# Patient Record
Sex: Female | Born: 1935
Health system: Southern US, Community
[De-identification: ages and names within clinical notes are randomized; demographics above are authoritative.]

## PROBLEM LIST (undated history)

## (undated) DIAGNOSIS — Z860100 Personal history of colon polyps, unspecified: Secondary | ICD-10-CM

## (undated) DIAGNOSIS — I4892 Unspecified atrial flutter: Secondary | ICD-10-CM

## (undated) DIAGNOSIS — R600 Localized edema: Secondary | ICD-10-CM

## (undated) DIAGNOSIS — M1990 Unspecified osteoarthritis, unspecified site: Secondary | ICD-10-CM

## (undated) DIAGNOSIS — I340 Nonrheumatic mitral (valve) insufficiency: Secondary | ICD-10-CM

## (undated) DIAGNOSIS — E538 Deficiency of other specified B group vitamins: Secondary | ICD-10-CM

## (undated) DIAGNOSIS — R51 Headache: Secondary | ICD-10-CM

## (undated) DIAGNOSIS — Z0000 Encounter for general adult medical examination without abnormal findings: Secondary | ICD-10-CM

## (undated) DIAGNOSIS — I10 Essential (primary) hypertension: Secondary | ICD-10-CM

## (undated) DIAGNOSIS — R634 Abnormal weight loss: Secondary | ICD-10-CM

## (undated) DIAGNOSIS — R001 Bradycardia, unspecified: Secondary | ICD-10-CM

## (undated) DIAGNOSIS — I341 Nonrheumatic mitral (valve) prolapse: Secondary | ICD-10-CM

## (undated) DIAGNOSIS — R32 Unspecified urinary incontinence: Secondary | ICD-10-CM

## (undated) DIAGNOSIS — K5790 Diverticulosis of intestine, part unspecified, without perforation or abscess without bleeding: Secondary | ICD-10-CM

## (undated) DIAGNOSIS — K5732 Diverticulitis of large intestine without perforation or abscess without bleeding: Secondary | ICD-10-CM

## (undated) DIAGNOSIS — K219 Gastro-esophageal reflux disease without esophagitis: Secondary | ICD-10-CM

## (undated) DIAGNOSIS — I4819 Other persistent atrial fibrillation: Secondary | ICD-10-CM

## (undated) DIAGNOSIS — T50995A Adverse effect of other drugs, medicaments and biological substances, initial encounter: Secondary | ICD-10-CM

## (undated) DIAGNOSIS — K921 Melena: Secondary | ICD-10-CM

## (undated) DIAGNOSIS — R269 Unspecified abnormalities of gait and mobility: Secondary | ICD-10-CM

## (undated) DIAGNOSIS — E079 Disorder of thyroid, unspecified: Secondary | ICD-10-CM

## (undated) DIAGNOSIS — F419 Anxiety disorder, unspecified: Secondary | ICD-10-CM

## (undated) DIAGNOSIS — Z7901 Long term (current) use of anticoagulants: Secondary | ICD-10-CM

## (undated) DIAGNOSIS — D649 Anemia, unspecified: Secondary | ICD-10-CM

## (undated) DIAGNOSIS — I080 Rheumatic disorders of both mitral and aortic valves: Secondary | ICD-10-CM

## (undated) DIAGNOSIS — K589 Irritable bowel syndrome without diarrhea: Secondary | ICD-10-CM

## (undated) DIAGNOSIS — M8580 Other specified disorders of bone density and structure, unspecified site: Secondary | ICD-10-CM

## (undated) DIAGNOSIS — I4891 Unspecified atrial fibrillation: Secondary | ICD-10-CM

## (undated) DIAGNOSIS — E785 Hyperlipidemia, unspecified: Secondary | ICD-10-CM

## (undated) DIAGNOSIS — I071 Rheumatic tricuspid insufficiency: Secondary | ICD-10-CM

## (undated) DIAGNOSIS — N200 Calculus of kidney: Secondary | ICD-10-CM

## (undated) HISTORY — DX: Adverse effect of other drugs, medicaments and biological substances, initial encounter: T50.995A

## (undated) HISTORY — DX: Personal history of colon polyps, unspecified: Z86.0100

## (undated) HISTORY — DX: Encounter for general adult medical examination without abnormal findings: Z00.00

## (undated) HISTORY — DX: Nonrheumatic mitral (valve) prolapse: I34.1

## (undated) HISTORY — DX: Unspecified atrial flutter: I48.92

## (undated) HISTORY — DX: Long term (current) use of anticoagulants: Z79.01

## (undated) HISTORY — DX: Essential (primary) hypertension: I10

## (undated) HISTORY — DX: Anemia, unspecified: D64.9

## (undated) HISTORY — DX: Deficiency of other specified B group vitamins: E53.8

## (undated) HISTORY — PX: CARDIAC ELECTROPHYSIOLOGY MAPPING AND ABLATION: SHX1292

## (undated) HISTORY — DX: Personal history of colonic polyps: Z86.010

## (undated) HISTORY — PX: ESOPHAGOSCOPY W/ BOTOX INJECTION: SHX1533

## (undated) HISTORY — DX: Unspecified osteoarthritis, unspecified site: M19.90

## (undated) HISTORY — DX: Abnormal weight loss: R63.4

## (undated) HISTORY — DX: Headache: R51

## (undated) HISTORY — DX: Gastro-esophageal reflux disease without esophagitis: K21.9

## (undated) HISTORY — DX: Disorder of thyroid, unspecified: E07.9

## (undated) HISTORY — DX: Calculus of kidney: N20.0

## (undated) HISTORY — DX: Unspecified urinary incontinence: R32

## (undated) HISTORY — DX: Anxiety disorder, unspecified: F41.9

## (undated) HISTORY — DX: Hyperlipidemia, unspecified: E78.5

## (undated) HISTORY — DX: Diverticulosis of intestine, part unspecified, without perforation or abscess without bleeding: K57.90

## (undated) HISTORY — DX: Diverticulitis of large intestine without perforation or abscess without bleeding: K57.32

## (undated) HISTORY — DX: Melena: K92.1

## (undated) HISTORY — DX: Irritable bowel syndrome, unspecified: K58.9

## (undated) HISTORY — DX: Unspecified abnormalities of gait and mobility: R26.9

## (undated) HISTORY — DX: Rheumatic disorders of both mitral and aortic valves: I08.0

## (undated) HISTORY — DX: Other specified disorders of bone density and structure, unspecified site: M85.80

## (undated) HISTORY — DX: Rheumatic tricuspid insufficiency: I07.1

## (undated) HISTORY — DX: Nonrheumatic mitral (valve) insufficiency: I34.0

## (undated) HISTORY — DX: Other persistent atrial fibrillation: I48.19

## (undated) HISTORY — DX: Bradycardia, unspecified: R00.1

## (undated) HISTORY — DX: Localized edema: R60.0

## (undated) HISTORY — PX: BREAST BIOPSY: SHX20

## (undated) HISTORY — PX: PARTIAL HYSTERECTOMY: SHX80

## (undated) HISTORY — PX: CATARACT EXTRACTION, BILATERAL: SHX1313

## (undated) HISTORY — PX: OTHER SURGICAL HISTORY: SHX169

---

## 1997-10-29 ENCOUNTER — Encounter: Payer: Self-pay | Admitting: Emergency Medicine

## 1997-10-29 ENCOUNTER — Emergency Department (HOSPITAL_COMMUNITY): Admission: EM | Admit: 1997-10-29 | Discharge: 1997-10-29 | Payer: Self-pay | Admitting: Emergency Medicine

## 1998-01-21 ENCOUNTER — Other Ambulatory Visit: Admission: RE | Admit: 1998-01-21 | Discharge: 1998-01-21 | Payer: Self-pay | Admitting: Family Medicine

## 1999-01-03 ENCOUNTER — Encounter: Payer: Self-pay | Admitting: Family Medicine

## 1999-01-03 ENCOUNTER — Encounter: Admission: RE | Admit: 1999-01-03 | Discharge: 1999-01-03 | Payer: Self-pay | Admitting: Family Medicine

## 1999-02-03 ENCOUNTER — Other Ambulatory Visit: Admission: RE | Admit: 1999-02-03 | Discharge: 1999-02-03 | Payer: Self-pay | Admitting: Family Medicine

## 1999-02-08 ENCOUNTER — Encounter: Admission: RE | Admit: 1999-02-08 | Discharge: 1999-02-08 | Payer: Self-pay | Admitting: Family Medicine

## 1999-02-08 ENCOUNTER — Encounter: Payer: Self-pay | Admitting: Family Medicine

## 2000-01-09 ENCOUNTER — Encounter: Admission: RE | Admit: 2000-01-09 | Discharge: 2000-01-09 | Payer: Self-pay | Admitting: Family Medicine

## 2000-01-09 ENCOUNTER — Encounter: Payer: Self-pay | Admitting: Family Medicine

## 2000-02-22 ENCOUNTER — Other Ambulatory Visit: Admission: RE | Admit: 2000-02-22 | Discharge: 2000-02-22 | Payer: Self-pay | Admitting: Family Medicine

## 2000-11-14 ENCOUNTER — Ambulatory Visit (HOSPITAL_COMMUNITY): Admission: RE | Admit: 2000-11-14 | Discharge: 2000-11-15 | Payer: Self-pay | Admitting: Internal Medicine

## 2001-02-25 ENCOUNTER — Encounter: Admission: RE | Admit: 2001-02-25 | Discharge: 2001-02-25 | Payer: Self-pay | Admitting: Family Medicine

## 2001-02-25 ENCOUNTER — Encounter: Payer: Self-pay | Admitting: Family Medicine

## 2001-03-03 ENCOUNTER — Encounter: Admission: RE | Admit: 2001-03-03 | Discharge: 2001-03-03 | Payer: Self-pay | Admitting: Family Medicine

## 2001-03-03 ENCOUNTER — Encounter: Payer: Self-pay | Admitting: Family Medicine

## 2002-03-05 ENCOUNTER — Encounter: Admission: RE | Admit: 2002-03-05 | Discharge: 2002-03-05 | Payer: Self-pay | Admitting: Family Medicine

## 2002-03-05 ENCOUNTER — Encounter: Payer: Self-pay | Admitting: Family Medicine

## 2003-03-22 ENCOUNTER — Encounter: Admission: RE | Admit: 2003-03-22 | Discharge: 2003-03-22 | Payer: Self-pay | Admitting: Family Medicine

## 2003-04-22 ENCOUNTER — Encounter: Admission: RE | Admit: 2003-04-22 | Discharge: 2003-04-22 | Payer: Self-pay | Admitting: Family Medicine

## 2004-01-26 ENCOUNTER — Ambulatory Visit: Payer: Self-pay | Admitting: Cardiology

## 2004-02-10 ENCOUNTER — Ambulatory Visit: Payer: Self-pay | Admitting: Family Medicine

## 2004-03-01 ENCOUNTER — Ambulatory Visit: Payer: Self-pay | Admitting: Family Medicine

## 2004-08-17 ENCOUNTER — Encounter: Admission: RE | Admit: 2004-08-17 | Discharge: 2004-08-17 | Payer: Self-pay | Admitting: Family Medicine

## 2004-12-12 ENCOUNTER — Ambulatory Visit: Payer: Self-pay | Admitting: Family Medicine

## 2004-12-28 ENCOUNTER — Emergency Department (HOSPITAL_COMMUNITY): Admission: EM | Admit: 2004-12-28 | Discharge: 2004-12-28 | Payer: Self-pay | Admitting: Emergency Medicine

## 2005-01-03 ENCOUNTER — Ambulatory Visit: Payer: Self-pay | Admitting: Family Medicine

## 2005-01-31 ENCOUNTER — Ambulatory Visit: Payer: Self-pay | Admitting: Cardiology

## 2005-02-14 ENCOUNTER — Ambulatory Visit: Payer: Self-pay | Admitting: Family Medicine

## 2005-04-19 ENCOUNTER — Ambulatory Visit: Payer: Self-pay | Admitting: Family Medicine

## 2005-04-20 ENCOUNTER — Ambulatory Visit: Payer: Self-pay | Admitting: Cardiology

## 2005-05-03 ENCOUNTER — Ambulatory Visit: Payer: Self-pay | Admitting: Family Medicine

## 2005-08-14 ENCOUNTER — Ambulatory Visit: Payer: Self-pay | Admitting: Family Medicine

## 2005-10-10 ENCOUNTER — Ambulatory Visit: Payer: Self-pay | Admitting: Family Medicine

## 2005-11-14 ENCOUNTER — Ambulatory Visit: Payer: Self-pay | Admitting: Internal Medicine

## 2005-11-28 ENCOUNTER — Ambulatory Visit: Payer: Self-pay | Admitting: Family Medicine

## 2006-01-01 LAB — HM COLONOSCOPY

## 2006-01-03 ENCOUNTER — Ambulatory Visit: Payer: Self-pay | Admitting: Cardiology

## 2006-01-08 ENCOUNTER — Encounter: Payer: Self-pay | Admitting: Cardiology

## 2006-01-08 ENCOUNTER — Ambulatory Visit: Payer: Self-pay

## 2006-01-08 ENCOUNTER — Ambulatory Visit: Payer: Self-pay | Admitting: Cardiology

## 2006-01-14 ENCOUNTER — Ambulatory Visit: Payer: Self-pay | Admitting: Cardiology

## 2006-01-17 ENCOUNTER — Ambulatory Visit: Payer: Self-pay | Admitting: Cardiology

## 2006-01-25 ENCOUNTER — Ambulatory Visit: Payer: Self-pay | Admitting: Internal Medicine

## 2006-02-01 ENCOUNTER — Ambulatory Visit: Payer: Self-pay | Admitting: Internal Medicine

## 2006-02-13 ENCOUNTER — Ambulatory Visit: Payer: Self-pay | Admitting: *Deleted

## 2006-02-20 ENCOUNTER — Ambulatory Visit: Payer: Self-pay | Admitting: Family Medicine

## 2006-02-20 LAB — CONVERTED CEMR LAB
ALT: 17 units/L (ref 0–40)
AST: 23 units/L (ref 0–37)
Albumin: 3.5 g/dL (ref 3.5–5.2)
Alkaline Phosphatase: 16 units/L — ABNORMAL LOW (ref 39–117)
BUN: 16 mg/dL (ref 6–23)
Basophils Absolute: 0.1 10*3/uL (ref 0.0–0.1)
Bilirubin, Direct: 0.1 mg/dL (ref 0.0–0.3)
Calcium: 9.5 mg/dL (ref 8.4–10.5)
Chloride: 106 meq/L (ref 96–112)
Cholesterol: 148 mg/dL (ref 0–200)
Eosinophils Absolute: 0.1 10*3/uL (ref 0.0–0.6)
GFR calc Af Amer: 56 mL/min
GFR calc non Af Amer: 46 mL/min
HDL: 41.5 mg/dL (ref >39.0)
Lymphocytes Relative: 32.8 % (ref 12.0–46.0)
MCV: 91 fL (ref 78.0–100.0)
Monocytes Relative: 5.3 % (ref 3.0–11.0)
Neutro Abs: 3.5 10*3/uL (ref 1.4–7.7)
Platelets: 206 10*3/uL (ref 150–400)
RBC: 3.9 M/uL (ref 3.87–5.11)
Triglycerides: 311 mg/dL (ref 0–149)
WBC: 5.9 10*3/uL (ref 4.5–10.5)

## 2006-02-27 ENCOUNTER — Ambulatory Visit: Payer: Self-pay | Admitting: Internal Medicine

## 2006-03-06 ENCOUNTER — Ambulatory Visit: Payer: Self-pay | Admitting: Gastroenterology

## 2006-03-13 ENCOUNTER — Ambulatory Visit: Payer: Self-pay | Admitting: Cardiology

## 2006-03-20 ENCOUNTER — Encounter: Admission: RE | Admit: 2006-03-20 | Discharge: 2006-03-20 | Payer: Self-pay | Admitting: Family Medicine

## 2006-03-21 ENCOUNTER — Ambulatory Visit (HOSPITAL_COMMUNITY): Admission: RE | Admit: 2006-03-21 | Discharge: 2006-03-21 | Payer: Self-pay | Admitting: Gastroenterology

## 2006-03-21 ENCOUNTER — Encounter: Payer: Self-pay | Admitting: Gastroenterology

## 2006-03-27 ENCOUNTER — Ambulatory Visit: Payer: Self-pay | Admitting: Cardiovascular Disease

## 2006-04-02 ENCOUNTER — Ambulatory Visit: Payer: Self-pay | Admitting: Gastroenterology

## 2006-04-02 ENCOUNTER — Encounter: Admission: RE | Admit: 2006-04-02 | Discharge: 2006-04-02 | Payer: Self-pay | Admitting: Family Medicine

## 2006-04-03 ENCOUNTER — Ambulatory Visit: Payer: Self-pay | Admitting: Cardiovascular Disease

## 2006-04-10 ENCOUNTER — Ambulatory Visit: Payer: Self-pay | Admitting: Cardiology

## 2006-04-10 ENCOUNTER — Ambulatory Visit: Payer: Self-pay | Admitting: Internal Medicine

## 2006-04-15 ENCOUNTER — Ambulatory Visit (HOSPITAL_COMMUNITY): Admission: RE | Admit: 2006-04-15 | Discharge: 2006-04-15 | Payer: Self-pay | Admitting: Gastroenterology

## 2006-04-18 ENCOUNTER — Ambulatory Visit: Payer: Self-pay | Admitting: Gastroenterology

## 2006-04-24 ENCOUNTER — Ambulatory Visit: Payer: Self-pay | Admitting: *Deleted

## 2006-04-29 ENCOUNTER — Ambulatory Visit: Payer: Self-pay | Admitting: Cardiovascular Disease

## 2006-04-29 ENCOUNTER — Observation Stay (HOSPITAL_COMMUNITY): Admission: EM | Admit: 2006-04-29 | Discharge: 2006-05-01 | Payer: Self-pay | Admitting: Emergency Medicine

## 2006-05-08 ENCOUNTER — Ambulatory Visit: Payer: Self-pay | Admitting: *Deleted

## 2006-05-15 ENCOUNTER — Ambulatory Visit: Payer: Self-pay | Admitting: Cardiology

## 2006-05-22 ENCOUNTER — Ambulatory Visit: Payer: Self-pay | Admitting: Cardiology

## 2006-06-12 ENCOUNTER — Ambulatory Visit: Payer: Self-pay | Admitting: Cardiology

## 2006-06-28 ENCOUNTER — Ambulatory Visit: Payer: Self-pay | Admitting: Cardiology

## 2006-07-26 ENCOUNTER — Ambulatory Visit: Payer: Self-pay | Admitting: Cardiology

## 2006-08-02 ENCOUNTER — Ambulatory Visit: Payer: Self-pay | Admitting: Cardiology

## 2006-08-02 ENCOUNTER — Ambulatory Visit: Payer: Self-pay | Admitting: Internal Medicine

## 2006-08-06 ENCOUNTER — Ambulatory Visit: Payer: Self-pay

## 2006-08-06 ENCOUNTER — Encounter: Payer: Self-pay | Admitting: Family Medicine

## 2006-08-15 ENCOUNTER — Ambulatory Visit: Payer: Self-pay | Admitting: Cardiovascular Disease

## 2006-09-12 ENCOUNTER — Ambulatory Visit: Payer: Self-pay | Admitting: Cardiology

## 2006-10-01 ENCOUNTER — Telehealth: Payer: Self-pay | Admitting: Family Medicine

## 2006-10-03 ENCOUNTER — Ambulatory Visit: Payer: Self-pay | Admitting: Cardiology

## 2006-10-21 ENCOUNTER — Ambulatory Visit: Payer: Self-pay | Admitting: Internal Medicine

## 2006-11-07 ENCOUNTER — Ambulatory Visit: Payer: Self-pay | Admitting: Cardiology

## 2006-11-21 ENCOUNTER — Ambulatory Visit: Payer: Self-pay | Admitting: Cardiology

## 2006-12-12 ENCOUNTER — Ambulatory Visit: Payer: Self-pay | Admitting: Cardiology

## 2007-01-02 LAB — HM MAMMOGRAPHY

## 2007-01-10 ENCOUNTER — Ambulatory Visit: Payer: Self-pay | Admitting: Cardiology

## 2007-01-29 ENCOUNTER — Ambulatory Visit: Payer: Self-pay | Admitting: Family Medicine

## 2007-01-29 ENCOUNTER — Encounter: Payer: Self-pay | Admitting: Family Medicine

## 2007-01-29 ENCOUNTER — Other Ambulatory Visit: Admission: RE | Admit: 2007-01-29 | Discharge: 2007-01-29 | Payer: Self-pay | Admitting: Family Medicine

## 2007-01-29 DIAGNOSIS — Z860100 Personal history of colon polyps, unspecified: Secondary | ICD-10-CM | POA: Insufficient documentation

## 2007-01-29 DIAGNOSIS — I10 Essential (primary) hypertension: Secondary | ICD-10-CM | POA: Insufficient documentation

## 2007-01-29 DIAGNOSIS — E039 Hypothyroidism, unspecified: Secondary | ICD-10-CM | POA: Insufficient documentation

## 2007-01-29 DIAGNOSIS — K219 Gastro-esophageal reflux disease without esophagitis: Secondary | ICD-10-CM

## 2007-01-29 DIAGNOSIS — N390 Urinary tract infection, site not specified: Secondary | ICD-10-CM

## 2007-01-29 DIAGNOSIS — M899 Disorder of bone, unspecified: Secondary | ICD-10-CM | POA: Insufficient documentation

## 2007-01-29 DIAGNOSIS — M949 Disorder of cartilage, unspecified: Secondary | ICD-10-CM

## 2007-01-29 DIAGNOSIS — E782 Mixed hyperlipidemia: Secondary | ICD-10-CM

## 2007-01-29 LAB — CONVERTED CEMR LAB
Bilirubin Urine: NEGATIVE
Glucose, Urine, Semiquant: NEGATIVE
Ketones, urine, test strip: NEGATIVE
Protein, U semiquant: NEGATIVE
pH: 6.5

## 2007-02-03 DIAGNOSIS — K589 Irritable bowel syndrome without diarrhea: Secondary | ICD-10-CM | POA: Insufficient documentation

## 2007-02-03 LAB — CONVERTED CEMR LAB
AST: 23 units/L (ref 0–37)
Bilirubin, Direct: 0.2 mg/dL (ref 0.0–0.3)
Direct LDL: 72.7 mg/dL
Eosinophils Absolute: 0 10*3/uL (ref 0.0–0.6)
Eosinophils Relative: 0.8 % (ref 0.0–5.0)
GFR calc Af Amer: 51 mL/min
GFR calc non Af Amer: 42 mL/min
Glucose, Bld: 105 mg/dL — ABNORMAL HIGH (ref 70–99)
HCT: 36.8 % (ref 36.0–46.0)
HDL: 37.6 mg/dL — ABNORMAL LOW (ref >39.0)
Hemoglobin: 12.7 g/dL (ref 12.0–15.0)
Lymphocytes Relative: 30.1 % (ref 12.0–46.0)
MCV: 91.2 fL (ref 78.0–100.0)
Neutro Abs: 3.5 10*3/uL (ref 1.4–7.7)
Neutrophils Relative %: 62.4 % (ref 43.0–77.0)
Sodium: 142 meq/L (ref 135–145)
Total Protein: 6.9 g/dL (ref 6.0–8.3)
Vit D, 1,25-Dihydroxy: 17 — ABNORMAL LOW (ref 30–89)
WBC: 5.6 10*3/uL (ref 4.5–10.5)

## 2007-02-05 ENCOUNTER — Ambulatory Visit: Payer: Self-pay | Admitting: Cardiology

## 2007-02-05 ENCOUNTER — Ambulatory Visit: Payer: Self-pay | Admitting: Internal Medicine

## 2007-02-06 ENCOUNTER — Encounter: Payer: Self-pay | Admitting: Family Medicine

## 2007-02-12 ENCOUNTER — Ambulatory Visit: Payer: Self-pay | Admitting: Family Medicine

## 2007-02-20 ENCOUNTER — Encounter: Payer: Self-pay | Admitting: Family Medicine

## 2007-02-26 ENCOUNTER — Ambulatory Visit: Payer: Self-pay | Admitting: Internal Medicine

## 2007-03-15 ENCOUNTER — Emergency Department (HOSPITAL_COMMUNITY): Admission: EM | Admit: 2007-03-15 | Discharge: 2007-03-15 | Payer: Self-pay | Admitting: Emergency Medicine

## 2007-03-20 ENCOUNTER — Ambulatory Visit: Payer: Self-pay | Admitting: Family Medicine

## 2007-03-20 LAB — CONVERTED CEMR LAB: Hemoglobin: 8.1 g/dL

## 2007-03-26 ENCOUNTER — Ambulatory Visit: Payer: Self-pay | Admitting: Cardiology

## 2007-03-27 ENCOUNTER — Ambulatory Visit (HOSPITAL_COMMUNITY): Admission: RE | Admit: 2007-03-27 | Discharge: 2007-03-27 | Payer: Self-pay | Admitting: Family Medicine

## 2007-03-27 ENCOUNTER — Ambulatory Visit: Payer: Self-pay | Admitting: Family Medicine

## 2007-04-02 ENCOUNTER — Telehealth: Payer: Self-pay | Admitting: Family Medicine

## 2007-04-03 ENCOUNTER — Ambulatory Visit: Payer: Self-pay | Admitting: Family Medicine

## 2007-04-18 ENCOUNTER — Ambulatory Visit: Payer: Self-pay | Admitting: Internal Medicine

## 2007-05-09 ENCOUNTER — Ambulatory Visit: Payer: Self-pay | Admitting: Cardiology

## 2007-05-21 ENCOUNTER — Encounter: Admission: RE | Admit: 2007-05-21 | Discharge: 2007-05-21 | Payer: Self-pay | Admitting: Family Medicine

## 2007-06-06 ENCOUNTER — Ambulatory Visit: Payer: Self-pay | Admitting: Cardiovascular Disease

## 2007-06-20 ENCOUNTER — Ambulatory Visit: Payer: Self-pay | Admitting: Internal Medicine

## 2007-07-10 ENCOUNTER — Ambulatory Visit: Payer: Self-pay | Admitting: Cardiology

## 2007-07-24 ENCOUNTER — Ambulatory Visit: Payer: Self-pay | Admitting: Internal Medicine

## 2007-08-07 ENCOUNTER — Ambulatory Visit: Payer: Self-pay | Admitting: Cardiology

## 2007-08-14 ENCOUNTER — Ambulatory Visit: Payer: Self-pay | Admitting: Internal Medicine

## 2007-09-11 ENCOUNTER — Ambulatory Visit: Payer: Self-pay | Admitting: Cardiology

## 2007-09-18 ENCOUNTER — Encounter: Payer: Self-pay | Admitting: Family Medicine

## 2007-09-18 ENCOUNTER — Telehealth: Payer: Self-pay | Admitting: Family Medicine

## 2007-10-09 ENCOUNTER — Ambulatory Visit: Payer: Self-pay | Admitting: Cardiology

## 2007-11-06 ENCOUNTER — Ambulatory Visit: Payer: Self-pay | Admitting: Cardiology

## 2007-12-04 ENCOUNTER — Ambulatory Visit: Payer: Self-pay | Admitting: Cardiology

## 2008-01-01 ENCOUNTER — Ambulatory Visit: Payer: Self-pay | Admitting: Internal Medicine

## 2008-01-15 ENCOUNTER — Ambulatory Visit: Payer: Self-pay | Admitting: Family Medicine

## 2008-01-15 DIAGNOSIS — E559 Vitamin D deficiency, unspecified: Secondary | ICD-10-CM

## 2008-01-15 LAB — CONVERTED CEMR LAB
Blood in Urine, dipstick: NEGATIVE
Glucose, Urine, Semiquant: NEGATIVE
Ketones, urine, test strip: NEGATIVE
Protein, U semiquant: NEGATIVE
pH: 7

## 2008-01-20 LAB — CONVERTED CEMR LAB
ALT: 21 units/L (ref 0–35)
AST: 26 units/L (ref 0–37)
Basophils Absolute: 0 10*3/uL (ref 0.0–0.1)
Basophils Relative: 0.1 % (ref 0.0–3.0)
Bilirubin, Direct: 0.1 mg/dL (ref 0.0–0.3)
CO2: 30 meq/L (ref 19–32)
Chloride: 101 meq/L (ref 96–112)
Direct LDL: 79.9 mg/dL
Eosinophils Absolute: 0.1 10*3/uL (ref 0.0–0.7)
GFR calc non Af Amer: 42 mL/min
HDL: 37.9 mg/dL — ABNORMAL LOW (ref >39.0)
Lymphocytes Relative: 13.9 % (ref 12.0–46.0)
MCHC: 34.6 g/dL (ref 30.0–36.0)
MCV: 92 fL (ref 78.0–100.0)
Neutrophils Relative %: 80.5 % — ABNORMAL HIGH (ref 43.0–77.0)
Platelets: 172 10*3/uL (ref 150–400)
Potassium: 3.4 meq/L — ABNORMAL LOW (ref 3.5–5.1)
RBC: 4.05 M/uL (ref 3.87–5.11)
RDW: 13.8 % (ref 11.5–14.6)
Sodium: 141 meq/L (ref 135–145)
Total Bilirubin: 0.8 mg/dL (ref 0.3–1.2)
Triglycerides: 204 mg/dL (ref 0–149)
VLDL: 41 mg/dL — ABNORMAL HIGH (ref 0–40)

## 2008-01-22 ENCOUNTER — Ambulatory Visit: Payer: Self-pay | Admitting: Family Medicine

## 2008-01-22 LAB — CONVERTED CEMR LAB
OCCULT 1: NEGATIVE
OCCULT 2: NEGATIVE
OCCULT 3: NEGATIVE

## 2008-01-27 ENCOUNTER — Telehealth: Payer: Self-pay | Admitting: Family Medicine

## 2008-02-04 ENCOUNTER — Ambulatory Visit: Payer: Self-pay | Admitting: Cardiology

## 2008-02-20 ENCOUNTER — Ambulatory Visit: Payer: Self-pay | Admitting: Cardiovascular Disease

## 2008-02-20 ENCOUNTER — Encounter: Payer: Self-pay | Admitting: Cardiology

## 2008-02-20 ENCOUNTER — Ambulatory Visit: Payer: Self-pay

## 2008-03-19 ENCOUNTER — Ambulatory Visit: Payer: Self-pay | Admitting: Cardiology

## 2008-04-16 ENCOUNTER — Ambulatory Visit: Payer: Self-pay | Admitting: Internal Medicine

## 2008-04-21 ENCOUNTER — Telehealth: Payer: Self-pay | Admitting: Family Medicine

## 2008-04-26 ENCOUNTER — Telehealth: Payer: Self-pay | Admitting: Family Medicine

## 2008-05-14 ENCOUNTER — Emergency Department (HOSPITAL_BASED_OUTPATIENT_CLINIC_OR_DEPARTMENT_OTHER): Admission: EM | Admit: 2008-05-14 | Discharge: 2008-05-15 | Payer: Self-pay | Admitting: Emergency Medicine

## 2008-05-14 ENCOUNTER — Ambulatory Visit: Payer: Self-pay | Admitting: Cardiology

## 2008-05-23 ENCOUNTER — Ambulatory Visit: Payer: Self-pay | Admitting: Diagnostic Radiology

## 2008-05-23 ENCOUNTER — Encounter: Payer: Self-pay | Admitting: Emergency Medicine

## 2008-05-23 ENCOUNTER — Ambulatory Visit: Payer: Self-pay | Admitting: Internal Medicine

## 2008-05-23 ENCOUNTER — Inpatient Hospital Stay (HOSPITAL_COMMUNITY): Admission: EM | Admit: 2008-05-23 | Discharge: 2008-05-25 | Payer: Self-pay | Admitting: Internal Medicine

## 2008-05-28 ENCOUNTER — Encounter: Payer: Self-pay | Admitting: Family Medicine

## 2008-05-28 ENCOUNTER — Encounter: Admission: RE | Admit: 2008-05-28 | Discharge: 2008-05-28 | Payer: Self-pay | Admitting: Family Medicine

## 2008-06-01 ENCOUNTER — Ambulatory Visit: Payer: Self-pay | Admitting: Family Medicine

## 2008-06-01 ENCOUNTER — Encounter: Payer: Self-pay | Admitting: *Deleted

## 2008-06-01 ENCOUNTER — Telehealth: Payer: Self-pay | Admitting: Family Medicine

## 2008-06-01 ENCOUNTER — Encounter: Admission: RE | Admit: 2008-06-01 | Discharge: 2008-06-01 | Payer: Self-pay | Admitting: Family Medicine

## 2008-06-11 ENCOUNTER — Ambulatory Visit: Payer: Self-pay | Admitting: Cardiovascular Disease

## 2008-06-11 LAB — CONVERTED CEMR LAB: Protime: 18.2

## 2008-07-07 ENCOUNTER — Encounter: Payer: Self-pay | Admitting: *Deleted

## 2008-07-09 ENCOUNTER — Ambulatory Visit: Payer: Self-pay | Admitting: Cardiology

## 2008-07-09 ENCOUNTER — Encounter (INDEPENDENT_AMBULATORY_CARE_PROVIDER_SITE_OTHER): Payer: Self-pay | Admitting: Cardiology

## 2008-07-09 LAB — CONVERTED CEMR LAB: Prothrombin Time: 18.4 s

## 2008-07-23 ENCOUNTER — Encounter (INDEPENDENT_AMBULATORY_CARE_PROVIDER_SITE_OTHER): Payer: Self-pay | Admitting: *Deleted

## 2008-08-06 ENCOUNTER — Ambulatory Visit: Payer: Self-pay | Admitting: Cardiology

## 2008-08-06 LAB — CONVERTED CEMR LAB: Prothrombin Time: 20.7 s

## 2008-08-20 DIAGNOSIS — I4891 Unspecified atrial fibrillation: Secondary | ICD-10-CM

## 2008-08-20 DIAGNOSIS — I080 Rheumatic disorders of both mitral and aortic valves: Secondary | ICD-10-CM

## 2008-08-20 DIAGNOSIS — Z8679 Personal history of other diseases of the circulatory system: Secondary | ICD-10-CM | POA: Insufficient documentation

## 2008-08-24 ENCOUNTER — Ambulatory Visit: Payer: Self-pay | Admitting: Cardiology

## 2008-09-03 ENCOUNTER — Ambulatory Visit: Payer: Self-pay | Admitting: Internal Medicine

## 2008-09-15 ENCOUNTER — Ambulatory Visit: Payer: Self-pay | Admitting: Family Medicine

## 2008-09-15 DIAGNOSIS — F411 Generalized anxiety disorder: Secondary | ICD-10-CM

## 2008-10-01 ENCOUNTER — Ambulatory Visit: Payer: Self-pay | Admitting: Internal Medicine

## 2008-10-22 ENCOUNTER — Ambulatory Visit: Payer: Self-pay | Admitting: Cardiology

## 2008-10-22 LAB — CONVERTED CEMR LAB: POC INR: 2.4

## 2008-11-19 ENCOUNTER — Ambulatory Visit: Payer: Self-pay | Admitting: Cardiology

## 2008-11-19 LAB — CONVERTED CEMR LAB: POC INR: 2.5

## 2008-12-15 ENCOUNTER — Encounter (INDEPENDENT_AMBULATORY_CARE_PROVIDER_SITE_OTHER): Payer: Self-pay | Admitting: *Deleted

## 2008-12-17 ENCOUNTER — Ambulatory Visit: Payer: Self-pay | Admitting: Cardiovascular Disease

## 2009-01-09 ENCOUNTER — Emergency Department (HOSPITAL_BASED_OUTPATIENT_CLINIC_OR_DEPARTMENT_OTHER): Admission: EM | Admit: 2009-01-09 | Discharge: 2009-01-10 | Payer: Self-pay | Admitting: Emergency Medicine

## 2009-01-14 ENCOUNTER — Ambulatory Visit: Payer: Self-pay | Admitting: Internal Medicine

## 2009-01-14 LAB — CONVERTED CEMR LAB: POC INR: 2.1

## 2009-01-19 ENCOUNTER — Ambulatory Visit: Payer: Self-pay | Admitting: Family Medicine

## 2009-01-19 DIAGNOSIS — K222 Esophageal obstruction: Secondary | ICD-10-CM | POA: Insufficient documentation

## 2009-01-19 DIAGNOSIS — D649 Anemia, unspecified: Secondary | ICD-10-CM | POA: Insufficient documentation

## 2009-01-19 LAB — CONVERTED CEMR LAB
Bilirubin Urine: NEGATIVE
Glucose, Urine, Semiquant: NEGATIVE
Protein, U semiquant: NEGATIVE
Specific Gravity, Urine: 1.015
pH: 5.5

## 2009-01-20 ENCOUNTER — Encounter (INDEPENDENT_AMBULATORY_CARE_PROVIDER_SITE_OTHER): Payer: Self-pay | Admitting: *Deleted

## 2009-01-20 LAB — CONVERTED CEMR LAB
ALT: 17 units/L (ref 0–35)
BUN: 16 mg/dL (ref 6–23)
Basophils Absolute: 0 10*3/uL (ref 0.0–0.1)
CO2: 28 meq/L (ref 19–32)
Chloride: 108 meq/L (ref 96–112)
Cholesterol: 142 mg/dL (ref 0–200)
Creatinine, Ser: 1.2 mg/dL (ref 0.4–1.2)
Direct LDL: 72.1 mg/dL
Eosinophils Absolute: 0 10*3/uL (ref 0.0–0.7)
Eosinophils Relative: 0.9 % (ref 0.0–5.0)
Glucose, Bld: 98 mg/dL (ref 70–99)
HCT: 36.2 % (ref 36.0–46.0)
Lymphs Abs: 1.3 10*3/uL (ref 0.7–4.0)
MCHC: 33.4 g/dL (ref 30.0–36.0)
MCV: 94.6 fL (ref 78.0–100.0)
Monocytes Absolute: 0.3 10*3/uL (ref 0.1–1.0)
Neutrophils Relative %: 65.5 % (ref 43.0–77.0)
Platelets: 188 10*3/uL (ref 150.0–400.0)
Potassium: 3.9 meq/L (ref 3.5–5.1)
RDW: 13.8 % (ref 11.5–14.6)
TSH: 2.51 microintl units/mL (ref 0.35–5.50)
Total Bilirubin: 0.7 mg/dL (ref 0.3–1.2)
Vit D, 25-Hydroxy: 26 ng/mL — ABNORMAL LOW (ref 30–89)

## 2009-01-31 ENCOUNTER — Ambulatory Visit: Payer: Self-pay | Admitting: Family Medicine

## 2009-01-31 LAB — CONVERTED CEMR LAB: OCCULT 2: NEGATIVE

## 2009-02-03 ENCOUNTER — Ambulatory Visit: Payer: Self-pay | Admitting: Cardiology

## 2009-02-11 ENCOUNTER — Encounter (INDEPENDENT_AMBULATORY_CARE_PROVIDER_SITE_OTHER): Payer: Self-pay | Admitting: Cardiology

## 2009-02-11 ENCOUNTER — Ambulatory Visit: Payer: Self-pay | Admitting: Internal Medicine

## 2009-02-16 ENCOUNTER — Encounter: Payer: Self-pay | Admitting: Family Medicine

## 2009-02-18 ENCOUNTER — Ambulatory Visit: Payer: Self-pay | Admitting: Gastroenterology

## 2009-02-28 ENCOUNTER — Telehealth: Payer: Self-pay | Admitting: Gastroenterology

## 2009-02-28 ENCOUNTER — Telehealth: Payer: Self-pay | Admitting: Cardiology

## 2009-03-04 ENCOUNTER — Ambulatory Visit: Payer: Self-pay | Admitting: Gastroenterology

## 2009-03-04 ENCOUNTER — Ambulatory Visit (HOSPITAL_COMMUNITY): Admission: RE | Admit: 2009-03-04 | Discharge: 2009-03-04 | Payer: Self-pay | Admitting: Gastroenterology

## 2009-03-04 ENCOUNTER — Encounter: Payer: Self-pay | Admitting: Family Medicine

## 2009-03-11 ENCOUNTER — Ambulatory Visit: Payer: Self-pay | Admitting: Cardiology

## 2009-03-24 ENCOUNTER — Telehealth: Payer: Self-pay | Admitting: Family Medicine

## 2009-03-25 ENCOUNTER — Ambulatory Visit: Payer: Self-pay | Admitting: Internal Medicine

## 2009-03-25 LAB — CONVERTED CEMR LAB: POC INR: 1.9

## 2009-04-11 ENCOUNTER — Ambulatory Visit: Payer: Self-pay | Admitting: Gastroenterology

## 2009-04-11 DIAGNOSIS — R1310 Dysphagia, unspecified: Secondary | ICD-10-CM | POA: Insufficient documentation

## 2009-04-15 ENCOUNTER — Telehealth: Payer: Self-pay | Admitting: Gastroenterology

## 2009-04-15 ENCOUNTER — Ambulatory Visit (HOSPITAL_COMMUNITY): Admission: RE | Admit: 2009-04-15 | Discharge: 2009-04-15 | Payer: Self-pay | Admitting: Gastroenterology

## 2009-04-18 ENCOUNTER — Encounter: Payer: Self-pay | Admitting: Gastroenterology

## 2009-04-18 ENCOUNTER — Encounter (INDEPENDENT_AMBULATORY_CARE_PROVIDER_SITE_OTHER): Payer: Self-pay | Admitting: *Deleted

## 2009-04-20 ENCOUNTER — Ambulatory Visit: Payer: Self-pay | Admitting: Cardiology

## 2009-04-20 LAB — CONVERTED CEMR LAB: POC INR: 2.4

## 2009-05-10 ENCOUNTER — Ambulatory Visit (HOSPITAL_COMMUNITY): Admission: RE | Admit: 2009-05-10 | Discharge: 2009-05-10 | Payer: Self-pay | Admitting: Gastroenterology

## 2009-05-10 ENCOUNTER — Encounter: Payer: Self-pay | Admitting: Gastroenterology

## 2009-05-16 ENCOUNTER — Ambulatory Visit: Payer: Self-pay | Admitting: Gastroenterology

## 2009-05-18 ENCOUNTER — Ambulatory Visit: Payer: Self-pay | Admitting: Gastroenterology

## 2009-05-18 DIAGNOSIS — K220 Achalasia of cardia: Secondary | ICD-10-CM

## 2009-05-20 ENCOUNTER — Ambulatory Visit: Payer: Self-pay | Admitting: Internal Medicine

## 2009-06-03 ENCOUNTER — Ambulatory Visit (HOSPITAL_COMMUNITY): Admission: RE | Admit: 2009-06-03 | Discharge: 2009-06-03 | Payer: Self-pay | Admitting: Gastroenterology

## 2009-06-03 ENCOUNTER — Encounter: Payer: Self-pay | Admitting: Gastroenterology

## 2009-06-10 ENCOUNTER — Ambulatory Visit: Payer: Self-pay | Admitting: Cardiovascular Disease

## 2009-06-10 LAB — CONVERTED CEMR LAB: POC INR: 1.6

## 2009-06-29 ENCOUNTER — Ambulatory Visit: Payer: Self-pay | Admitting: Cardiology

## 2009-06-29 LAB — CONVERTED CEMR LAB: POC INR: 2.5

## 2009-07-08 ENCOUNTER — Encounter: Admission: RE | Admit: 2009-07-08 | Discharge: 2009-07-08 | Payer: Self-pay | Admitting: Family Medicine

## 2009-07-08 ENCOUNTER — Ambulatory Visit: Payer: Self-pay | Admitting: Gastroenterology

## 2009-07-08 LAB — HM MAMMOGRAPHY

## 2009-07-29 ENCOUNTER — Ambulatory Visit: Payer: Self-pay | Admitting: Cardiology

## 2009-07-29 LAB — CONVERTED CEMR LAB: POC INR: 2.4

## 2009-08-03 ENCOUNTER — Ambulatory Visit: Payer: Self-pay | Admitting: Cardiology

## 2009-08-03 DIAGNOSIS — R42 Dizziness and giddiness: Secondary | ICD-10-CM | POA: Insufficient documentation

## 2009-08-18 ENCOUNTER — Telehealth: Payer: Self-pay | Admitting: Family Medicine

## 2009-08-26 ENCOUNTER — Ambulatory Visit: Payer: Self-pay | Admitting: Cardiology

## 2009-09-23 ENCOUNTER — Ambulatory Visit: Payer: Self-pay | Admitting: Internal Medicine

## 2009-09-23 LAB — CONVERTED CEMR LAB: POC INR: 2.4

## 2009-10-21 ENCOUNTER — Ambulatory Visit: Payer: Self-pay | Admitting: Internal Medicine

## 2009-11-16 ENCOUNTER — Telehealth: Payer: Self-pay | Admitting: Family Medicine

## 2009-11-18 ENCOUNTER — Ambulatory Visit: Payer: Self-pay | Admitting: Cardiology

## 2009-11-22 ENCOUNTER — Telehealth: Payer: Self-pay | Admitting: Family Medicine

## 2009-12-09 ENCOUNTER — Ambulatory Visit: Payer: Self-pay | Admitting: Internal Medicine

## 2009-12-09 LAB — CONVERTED CEMR LAB: POC INR: 2.1

## 2009-12-19 ENCOUNTER — Ambulatory Visit: Payer: Self-pay | Admitting: Internal Medicine

## 2009-12-20 LAB — CONVERTED CEMR LAB
Basophils Absolute: 0 10*3/uL (ref 0.0–0.1)
Eosinophils Absolute: 0.1 10*3/uL (ref 0.0–0.7)
Lymphocytes Relative: 33.5 % (ref 12.0–46.0)
MCHC: 34.7 g/dL (ref 30.0–36.0)
Neutrophils Relative %: 57.7 % (ref 43.0–77.0)
RDW: 14.4 % (ref 11.5–14.6)

## 2010-01-06 ENCOUNTER — Ambulatory Visit: Admission: RE | Admit: 2010-01-06 | Discharge: 2010-01-06 | Payer: Self-pay | Source: Home / Self Care

## 2010-01-30 ENCOUNTER — Encounter: Payer: Self-pay | Admitting: Family Medicine

## 2010-02-02 NOTE — Progress Notes (Signed)
Summary: estradiol 2mg   Phone Note Call from Patient Call back at [facility] Phone [phone]   Summary of Call: Starting to have hot flashes again night & day, 2-3 night and/or day. With hot weather or when she works thinks she'll be bothered.   Should I start the Estradiol pill again?  Needs Rx if so.  [facility].  NKDA. Initial call taken by: Rudy Jew, RN,  March 24, 2009 9:57 AM  Follow-up for Phone Call        ok pls find out if was on 1mg  or 2mg   Follow-up by: Pura Spice, RN,  March 24, 2009 10:32 AM  Additional Follow-up for Phone Call Additional follow up Details #1::        2MG  ONE DAILY was the last Estradiol. Additional Follow-up by: Rudy Jew, RN,  March 24, 2009 10:46 AM    Additional Follow-up for Phone Call Additional follow up Details #2::    ok done calleed to [facility] [place]  Follow-up by: Pura Spice, RN,  March 24, 2009 11:04 AM  Additional Follow-up for Phone Call Additional follow up Details #3:: Details for Additional Follow-up Action Taken: Patient informed. Additional Follow-up by: Rudy Jew, RN,  March 24, 2009 12:13 PM  New/Updated Medications: ESTRACE 2 MG TABS (ESTRADIOL) 1 by mouth once daily Prescriptions: ESTRACE 2 MG TABS (ESTRADIOL) 1 by mouth once daily  #30 x 6   Entered by:   Pura Spice, RN   Authorized by:   Judithann Sheen MD   Signed by:   Pura Spice, RN on 03/24/2009   Method used:   Electronically to        [facility] [address] [phone]* (retail)       [address]       [place], [place]  [address]       Ph: [phone]       Fax: [phone]   RxID:   [id number]

## 2010-02-02 NOTE — Medication Information (Signed)
Summary: ROV  Anticoagulant Therapy  Managed by: Bethena Midget, RN, BSN Referring MD: Valera Castle MD PCP: Dianna Limbo, MD Supervising MD: Tenny Craw MD, Gunnar Fusi Indication 1: Atrial Fibrillation (ICD-427.31) Lab Used: [facility] Site: [facility] INR POC 2.5 INR RANGE 2 - 3  Dietary changes: no    Health status changes: no    Bleeding/hemorrhagic complications: yes       Details: Had dark stool that started on 12/16/09 and went to see PCP on 12/19/09 stool sample was negative pt states  Recent/future hospitalizations: no    Any changes in medication regimen? no    Recent/future dental: no  Any missed doses?: no       Is patient compliant with meds? yes       Allergies: 1)  ! Sulfa 2)  ! Enablex (Darifenacin Hydrobromide)  Anticoagulation Management History:      The patient is taking warfarin and comes in today for a routine follow up visit.  Positive risk factors for bleeding include an age of 75 years or older and history of GI bleeding.  The bleeding index is 'intermediate risk'.  Positive CHADS2 values include History of HTN and Age > 75 years old.  The start date was 01/03/2006.  Her last INR was 2.5.  Anticoagulation responsible provider: Tenny Craw MD, Gunnar Fusi.  INR POC: 2.5.  Cuvette Lot#: [id number].  Exp: 02/2011.    Anticoagulation Management Assessment/Plan:      The patient's current anticoagulation dose is Warfarin sodium 2.5 mg tabs: Use as directed by Anticoagualtion Clinic.  The target INR is 2 - 3.  The next INR is due 02/03/2010.  Anticoagulation instructions were given to patient.  Results were reviewed/authorized by Bethena Midget, RN, BSN.  She was notified by Bethena Midget, RN, BSN.         Prior Anticoagulation Instructions: INR:  2.1  Your INR is at goal today.  Please continue to take 1 tablet everday except for Monday, when you take 1/2 a tablet.  Please return to clinic in 4 weeks for another INR check.   Current Anticoagulation Instructions: INR  2.5 Continue 1 pill everyday except 1/2 pill on Mondays. Recheck in 4 weeks.

## 2010-02-02 NOTE — Progress Notes (Signed)
Summary: refill xanax   Phone Note From Pharmacy   Caller: [facility]  [address] [phone]* Call For: DR STAFFORD  Summary of Call: xanax refill . Initial call taken by: Pura Spice, RN,  August 18, 2009 9:34 AM  Follow-up for Phone Call        ok per dr Scotty Court with 3 refills.  Follow-up by: Pura Spice, RN,  August 18, 2009 9:35 AM    New/Updated Medications: ALPRAZOLAM 0.25 MG  TABS (ALPRAZOLAM) 1  by mouth three times a day Prescriptions: ALPRAZOLAM 0.25 MG  TABS (ALPRAZOLAM) 1  by mouth three times a day  #90 x 3   Entered by:   Pura Spice, RN   Authorized by:   Judithann Sheen MD   Signed by:   Pura Spice, RN on 08/18/2009   Method used:   Telephoned to ...       [facility]  [address] [phone]* (retail)       [address]       [place], [place]  [address]       Ph: [phone]       Fax: [phone]   RxID:   [id number]

## 2010-02-02 NOTE — Procedures (Signed)
Summary: Prep/[facility]  Prep/[facility]   Imported By: Lester [place] 06/06/2009 10:58:07  _____________________________________________________________________  External Attachment:    Type:   Image     Comment:   External Document  Appended Document: Prep/[facility] reviewed

## 2010-02-02 NOTE — Letter (Signed)
Summary: EGD Instructions  [facility]  [address] [place], [place] [address]   Phone: [phone]  Fax: [phone]       [name] [name]    05-16-1924    MRN: [id number]       Procedure Day /Date:FRIDAY 03/04/2009     Arrival Time: 11:30AM     Procedure Time:12:30PM     Location of Procedure:                     [facility] ( Outpatient Registration)  PREPARATION FOR ENDOSCOPY/BALLOON    On 3/4/2011THE DAY OF THE PROCEDURE:  1.   No solid foods, milk or milk products are allowed after midnight the night before your procedure.  2.   Do not drink anything colored red or purple.  Avoid juices with pulp.  No orange juice.  3.  You may drink clear liquids until 8:30AM, which is 4 hours before your procedure.                                                                                                CLEAR LIQUIDS INCLUDE: Water Jello Ice Popsicles Tea (sugar ok, no milk/cream) Powdered fruit flavored drinks Coffee (sugar ok, no milk/cream) Gatorade Juice: apple, white grape, white cranberry  Lemonade Clear bullion, consomm, broth Carbonated beverages (any kind) Strained chicken noodle soup Hard Candy   MEDICATION INSTRUCTIONS  Unless otherwise instructed, you should take regular prescription medications with a small sip of water as early as possible the morning of your procedure.       You will be contaced by our office prior to your procedure for directions on holding your Coumadin/Warfarin.  If you do not hear from our office 1 week prior to your scheduled procedure, please call [phone] to discuss.             OTHER INSTRUCTIONS  You will need a responsible adult at least 75 years of age to accompany you and drive you home.   This person must remain in the waiting room during your procedure.  Wear loose fitting clothing that is easily removed.  Leave jewelry and other valuables at home.  However, you may wish to bring a  book to read or an iPod/MP3 player to listen to music as you wait for your procedure to start.  Remove all body piercing jewelry and leave at home.  Total time from sign-in until discharge is approximately 2-3 hours.  You should go home directly after your procedure and rest.  You can resume normal activities the day after your procedure.  The day of your procedure you should not:   Drive   Make legal decisions   Operate machinery   Drink alcohol   Return to work  You will receive specific instructions about eating, activities and medications before you leave.    The above instructions have been reviewed and explained to me by   _______________________    I fully understand and can verbalize these instructions _____________________________ Date _________

## 2010-02-02 NOTE — Medication Information (Signed)
Summary: rov/tm  Anticoagulant Therapy  Managed by: Eda Keys, PharmD Referring MD: Valera Castle MD PCP: Dianna Limbo, MD Supervising MD: Antoine Poche MD, Fayrene Fearing Indication 1: Atrial Fibrillation (ICD-427.31) Lab Used: [facility] Site: [facility] INR POC 1.6 INR RANGE 2 - 3  Dietary changes: no    Health status changes: no    Bleeding/hemorrhagic complications: no    Recent/future hospitalizations: no    Any changes in medication regimen? no    Recent/future dental: no  Any missed doses?: no       Is patient compliant with meds? yes       Allergies: 1)  ! Sulfa 2)  ! Enablex (Darifenacin Hydrobromide)  Anticoagulation Management History:      The patient is taking warfarin and comes in today for a routine follow up visit.  Positive risk factors for bleeding include an age of 75 years or older and history of GI bleeding.  The bleeding index is 'intermediate risk'.  Positive CHADS2 values include History of HTN and Age > 13 years old.  The start date was 01/03/2006.  Her last INR was 2.5.  Anticoagulation responsible provider: Antoine Poche MD, Fayrene Fearing.  INR POC: 1.6.  Cuvette Lot#: [id number].  Exp: 12/2010.    Anticoagulation Management Assessment/Plan:      The patient's current anticoagulation dose is Warfarin sodium 2.5 mg tabs: Use as directed by Anticoagualtion Clinic.  The target INR is 2 - 3.  The next INR is due 12/09/2009.  Anticoagulation instructions were given to patient.  Results were reviewed/authorized by Eda Keys, PharmD.  She was notified by Eda Keys.         Prior Anticoagulation Instructions: INR 2.4  Continue on same dosage 1 tablet daily except 1/2 tablet on Mondays.  Recheck in 4 weeks.    Current Anticoagulation Instructions: INR 1.6  Take an extra 1/2 tablet today.  Then return to normal dosing schedule of 1/2 tablet on Monday and 1 tablet all other days.  Return to clinic in 3 weeks.

## 2010-02-02 NOTE — Progress Notes (Signed)
Summary: Hold Coumadin   Phone Note Outgoing Call Call back at [facility] Phone [phone]   Call placed by: Merri Ray CMA Duncan Dull),  February 28, 2009 12:17 PM Summary of Call: Called pt to inform to hold coumadin per Dr Daleen Squibb until after her procedure. Explained to pt to call back if she had any further questions Initial call taken by: Merri Ray CMA Duncan Dull),  February 28, 2009 12:17 PM

## 2010-02-02 NOTE — Medication Information (Signed)
Summary: rov.mp  Anticoagulant Therapy  Managed by: Bethena Midget, RN, BSN Referring MD: Valera Castle MD PCP: Dianna Limbo, MD Supervising MD: Antoine Poche MD, Fayrene Fearing Indication 1: Atrial Fibrillation (ICD-427.31) Lab Used: [facility] Site: [facility] INR POC 1.5 INR RANGE 2 - 3  Dietary changes: no    Health status changes: no    Bleeding/hemorrhagic complications: no    Recent/future hospitalizations: no    Any changes in medication regimen? no    Recent/future dental: no  Any missed doses?: yes     Details: had a procedure last Friday on esphagus and was off for 4 days. Resumed coumadin on 03/05/09.   Is patient compliant with meds? yes       Allergies: 1)  ! Sulfa 2)  ! * Enablex  Anticoagulation Management History:      The patient is taking warfarin and comes in today for a routine follow up visit.  Positive risk factors for bleeding include an age of 75 years or older and history of GI bleeding.  The bleeding index is 'intermediate risk'.  Positive CHADS2 values include History of HTN and Age > 60 years old.  The start date was 01/03/2006.  Her last INR was 2.5.  Anticoagulation responsible provider: Antoine Poche MD, Fayrene Fearing.  INR POC: 1.5.  Cuvette Lot#: [id number].  Exp: 05/2010.    Anticoagulation Management Assessment/Plan:      The patient's current anticoagulation dose is Warfarin sodium 2.5 mg tabs: Use as directed by Anticoagualtion Clinic.  The target INR is 2 - 3.  The next INR is due 03/25/2009.  Anticoagulation instructions were given to patient.  Results were reviewed/authorized by Bethena Midget, RN, BSN.  She was notified by Bethena Midget, RN, BSN.         Prior Anticoagulation Instructions: INR 2.1  Continue 0.5 tab each Monday and 1 tab on all other days.  Recheck in 4 weeks.    Current Anticoagulation Instructions: INR 1.5 Today take extra 1/2 pill then resume 1 pill everyday except 1/2 pill on Mondays. Recheck in 2 weeks.

## 2010-02-02 NOTE — Assessment & Plan Note (Signed)
Summary: F6M/ANAS    Visit Type:  6  mo f/u Primary Provider:  Dr. Scotty [name]  CC:  no cardiac complaints today..pt does state she has had a little dizziness once in awhile.  History of Present Illness: [name] [name] comes in today for evaluation and management of paroxysmal atrial fibrillation.  She's doing remarkably well. She has occasional dizziness which does not sound like his orthostatic. We went over orthostatic precautions today anyway.  She denies any palpitations except on rare instances. She's had no syncope or presyncope. She denies any chest pain or dyspnea on exertion. She's had no lower extremity edema. She denies any bleeding including melena.  Current Medications (verified): 1)  Metoprolol Tartrate 25 Mg  Tabs (Metoprolol Tartrate) .Marland Kitchen.. 1by Mouth Two Times A Day 2)  Alprazolam 0.25 Mg  Tabs (Alprazolam) .Marland Kitchen.. 1  By Mouth Three Times A Day 3)  Hydrochlorothiazide 25 Mg  Tabs (Hydrochlorothiazide) .... Once Daily 4)  Altace 10 Mg  Caps (Ramipril) .... Once Daily 5)  Lipitor 20 Mg  Tabs (Atorvastatin Calcium) .... Once Daily 6)  Warfarin Sodium 2.5 Mg Tabs (Warfarin Sodium) .... Use As Directed By Anticoagualtion Clinic 7)  Nexium 40 Mg  Cpdr (Esomeprazole Magnesium) .Marland Kitchen.. 1 Cap Two Times A Day For Gerd 8)  Calcium 600/vitamin D 600-400 Mg-Unit Tabs (Calcium Carbonate-Vitamin D) .Marland Kitchen.. 1 Bid 9)  Vitamin D (Ergocalciferol) 50000 Unit Caps (Ergocalciferol) .Marland Kitchen.. 1 Weekly For 12 Weeks  Allergies: 1)  ! Sulfa 2)  ! 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 PAROXYSMAL ATRIAL FIBRILLATION (ICD-427.31) MITRAL REGURGITATION, MILD (ICD-396.3) MITRAL VALVE PROLAPSE, HX OF (ICD-V12.50) HYPERTENSION (ICD-401.9) HYPERLIPIDEMIA (ICD-272.4) VITAMIN D DEFICIENCY (ICD-268.9) PNEUMONIA (ICD-486) TRACHEITIS (ICD-464.10) COUGH (ICD-786.2) UNSTEADY GAIT (ICD-781.2) DIARRHEA  (ICD-787.91) IRON DEFICIENCY ANEMIA SECONDARY TO BLOOD LOSS (ICD-280.0) PNEUMONIA, ORGANISM UNSPECIFIED (ICD-486) MELENA (ICD-578.1) IBS (ICD-564.1) INCONTINENCE (ICD-788.30) CONSTIPATION (ICD-564.00) GERD (ICD-530.81) OSTEOPENIA (ICD-733.90) UTI (ICD-599.0) HYPOTHYROIDISM (ICD-244.9) UNS ADVRS EFF OTH RX MEDICINAL&BIOLOGICAL SBSTNC (ICD-995.29) COLONIC POLYPS, HX OF (ICD-V12.72) PAP 2009 MAMMOGRAM 2009 EYE EXAM 2009 EKG per Dr. Daleen [name] DEXA-BONE DENSITY Appt 02/03/08 PNEUMONIA VACCINE 2008 SHINGLES VACCINE refuses DT Colon scopy Dr [name] [name] 2008 SMOKER never   Past Surgical History: Last updated: September 08, 2008 Hysterectomy left breast bx ablation  Family History: Last updated: 2008-09-08 Mother died of diabetes and CVA at age 42.  Father died  of complications of COPD at age 40.  She had a __________ at 48.  He had  COPD, but she is not sure why he died.  Social History: Last updated: Sep 08, 2008  She lives in [place] with her daughter.  She works  as a Conservation officer, nature at [employer].  She denies any tobacco, alcohol, or drug  use now or ever in her lifetime.  She is active but does not routine  exercise.  Risk Factors: Smoking Status: never (01/29/2007)  Review of Systems       negative other than history of present illness  Vital Signs:  Patient profile:   77 year old female Height:      60 inches Weight:      132 pounds Pulse rate:   57 / minute Pulse rhythm:   irregular BP sitting:   134 / 80  (left arm) Cuff size:   large  Vitals Entered By: [name] [name], CMA (February 03, 2009 10:53 AM)  Physical Exam  General:  Well developed, well nourished, in no acute distress. elderly,  well kept Head:  normocephalic and atraumatic Eyes:  PERRLA/EOM intact; conjunctiva and lids normal. Neck:  Neck supple, no JVD. No masses, thyromegaly or abnormal cervical nodes. Chest [name] [name]:  no deformities or breast masses noted Lungs:  Clear bilaterally to auscultation and  percussion. Heart:  regular rate and rhythm, soft systolic murmur., no gout Msk:  decreased ROM.   Pulses:  pulses normal in all 4 extremities Extremities:  dependent rubor of light tint,trace left pedal edema and trace right pedal edema.  pulses intact Neurologic:  Alert and oriented x 3. Skin:  Intact without lesions or rashes. Psych:  Normal affect.   EKG  Procedure date:  02/03/2009  Findings:      sinus bradycardia, minimal voltage criteria for LVH, nonspecific ST-T wave changes, no significant change from last ECG  Impression & Recommendations:  Problem # 1:  PAROXYSMAL ATRIAL FIBRILLATION (ICD-427.31) Assessment Unchanged  Her updated medication list for this problem includes:    Metoprolol Tartrate 25 Mg Tabs (Metoprolol tartrate) .Marland KitchenMarland KitchenMarland KitchenMarland Kitchen 1by mouth two times a day    Warfarin Sodium 2.5 Mg Tabs (Warfarin sodium) ..... Use as directed by anticoagualtion clinic  Orders: EKG w/ Interpretation (93000)  Problem # 2:  MITRAL REGURGITATION, MILD (ICD-396.3) Assessment: Unchanged  Problem # 3:  COUMADIN THERAPY (ICD-V58.61) Assessment: Unchanged  Patient Instructions: 1)  Your physician recommends that you schedule a follow-up appointment in: 6 MONTHS WITH DR [name] [name] 2)  Your physician recommends that you continue on your current medications as directed. Please refer to the Current Medication list given to you today.

## 2010-02-02 NOTE — Progress Notes (Signed)
Summary: Manometry/REV Scheduled   Phone Note Outgoing Call   Call placed by: Laureen Ochs LPN,  April 15, 2009 4:40 PM Call placed to: Patient Summary of Call: Follow-up from [facility] on 04-15-09, pt. needs a manometry. She is scheduled at [facility] on 05-03-09 at 11am. REV with Dr.Vannia Pola is 05-18-09 at 11am. I will call pt. on Monday with appt. information. Initial call taken by: Laureen Ochs LPN,  April 15, 2009 5:04 PM  Follow-up for Phone Call        Above MD orders reviewed with patient. All instructions reviewed with pt. by phone and mailed to her. Pt. instructed to call back as needed.   Follow-up by: Laureen Ochs LPN,  April 18, 2009 9:35 AM

## 2010-02-02 NOTE — Procedures (Signed)
Summary: EGD with dilatation/[facility]  EGD with dilatation/[facility]   Imported By: Maryln Gottron 04/07/2009 11:09:56  _____________________________________________________________________  External Attachment:    Type:   Image     Comment:   External Document

## 2010-02-02 NOTE — Letter (Signed)
Summary: Anticoagulation Modification Letter  [facility]  [address] [place], [place] [address]   Phone: [phone]  Fax: [phone]    February 18, 2009  Re:    [name] DOB:    1924/04/24 MRN:    [id number]    Dear [name] [name]:  We have scheduled the above patient for an endoscopic procedure. Our records show that  she is on anticoagulation therapy. Please advise as to how long the patient may come off their therapy of coumadin  prior to the scheduled procedure(s) on 03/04/2009.   Please fax back/or route the completed form to Robin at [phone]  Thank you for your help with this matter.  Sincerely,  Merri Ray CMA Duncan Dull)   Physician Recommendation:  Hold Plavix 7 days prior ________________  Hold Coumadin 5 days prior ____________  Other ______________________________     Appended Document: Anticoagulation Modification Letter  Reviewed Juanito Doom, MD  Appended Document: Anticoagulation Modification Letter Additional follow up Details #1::        She can hold Coumadin. Additional Follow-up by: Gaylord Shih, MD, [facility],  February 28, 2009 11:40 AM

## 2010-02-02 NOTE — Progress Notes (Signed)
Summary: Pt req refill of Alprazolam.Pt completely out of med  Phone Note Refill Request   Refills Requested: Medication #1:  ALPRAZOLAM 0.25 MG  TABS 1  by mouth three times a day   Dosage confirmed as above?Dosage Confirmed   Supply Requested: 3 months Pt says that there are no refills remaining on script. Pt is completely out of medicine. Pls call in to [facility] on [address]. Pt did not get script filled in November.     Method Requested: Telephone to Pharmacy Initial call taken by: Lucy Antigua,  November 22, 2009 1:15 PM  Follow-up for Phone Call        according to the Alprazolam given in August she should have some until mid December, she will have to call back to get it then Follow-up by: Danise Edge MD,  November 23, 2009 8:53 AM  Additional Follow-up for Phone Call Additional follow up Details #1::        Pt called back. The script from August had 3 refills, which would last pt until Nov. Pt is out of med and it is due to be refilled. Pt can not go without med til mid December. Pls call med in asap.  Additional Follow-up by: Lucy Antigua,  November 23, 2009 10:33 AM    Additional Follow-up for Phone Call Additional follow up Details #2::    call in #90 with no rf  Follow-up by: Nelwyn Salisbury MD,  November 23, 2009 11:13 AM  Prescriptions: ALPRAZOLAM 0.25 MG  TABS (ALPRAZOLAM) 1  by mouth three times a day  #90 x 0   Entered by:   Sid Falcon LPN   Authorized by:   Nelwyn Salisbury MD   Signed by:   Sid Falcon LPN on 21/30/8657   Method used:   Telephoned to ...       [facility]  [address] [phone]* (retail)       [address]       [place], [place]  [address]       Ph: [phone]       Fax: [phone]   RxID:   [id number]

## 2010-02-02 NOTE — Procedures (Signed)
Summary: Colonoscopy   Colonoscopy  Procedure date:  03/21/2006  Findings:      Results: Diverticulosis.       Location:  [facility].   Patient Name: [name] [name], [name] [name] MRN:  Procedure Procedures: Colonoscopy CPT: [address].  Personnel: Endoscopist: Barbette Hair. Arlyce Dice, MD.  Patient Consent: Procedure, Alternatives, Risks and Benefits discussed, consent obtained, from patient.  Indications  Surveillance of: Adenomatous Polyp(s).  History  Current Medications: Patient is not currently taking Coumadin.  Pre-Exam Physical: Performed Mar 21, 2006. Entire physical exam was normal. Cardio- pulmonary exam, HEENT exam , Abdominal exam, Mental status exam WNL.  Comments: Patient history reviewed/updated, physical performed prior to initiation of sedation? Exam Exam: Extent of exam reached: Ileum, extent intended: Cecum.  The cecum was identified by appendiceal orifice and IC valve. Colon retroflexion performed. ASA Classification: II. Tolerance: good.  Monitoring: Pulse and BP monitoring, Oximetry used. Supplemental O2 given. at 2 Liters.  Colon Prep Used Miralax for colon prep. Prep results: good.  Sedation Meds: Patient assessed and found to be appropriate for moderate (conscious) sedation. Sedation was managed by the Endoscopist. Fentanyl 75 mcg. given IV. Versed 7 mg. given IV.  Findings - DIVERTICULOSIS: Transverse Colon to Sigmoid Colon. ICD9: Diverticulosis: 562.10. Comments: Scattered diverticula.  - NORMAL EXAM: Cecum to Transverse Colon.  NORMAL EXAM: Cecum.  - NORMAL EXAM: Sigmoid Colon to Rectum.   Assessment Abnormal examination, see findings above.  Diagnoses: 562.10: Diverticulosis.   Events  Unplanned Interventions: No intervention was required.  Unplanned Events: There were no complications. Plans  Post Exam Instructions: Post sedation instructions given.  Patient Education: Patient given standard instructions for: Diverticulosis.    Disposition: After procedure patient sent to recovery. After recovery patient sent home.  Scheduling/Referral: Follow-Up prn.    cc:   The Patient   Dianna Limbo, MD  This report was created from the original endoscopy report, which was reviewed and signed by the above listed endoscopist.

## 2010-02-02 NOTE — Assessment & Plan Note (Signed)
Summary: f/u--ch.    History of Present Illness Visit Type: Follow-up Visit Primary GI MD: Melvia Heaps MD [facility] Primary Provider: Dianna Limbo, MD Chief Complaint: dysphagia has improved History of Present Illness:   [name] [name] has returned following upper endoscopy with esophageal dilatation.  At endoscopy a large diverticulum was seen in the distal esophagus and a stenotic area was seen raising the question of achalasia.  She was dilated with an 18 mm balloon.  She reports some improvement in her dysphagia though it remains.  She has occasional dysphagia to liquids.   GI Review of Systems    Reports dysphagia with solids.      Denies abdominal pain, acid reflux, belching, bloating, chest pain, dysphagia with liquids, heartburn, loss of appetite, nausea, vomiting, vomiting blood, weight loss, and  weight gain.        Denies anal fissure, black tarry stools, change in bowel habit, constipation, diarrhea, diverticulosis, fecal incontinence, heme positive stool, hemorrhoids, irritable bowel syndrome, jaundice, light color stool, liver problems, rectal bleeding, and  rectal pain.    Current Medications (verified): 1)  Metoprolol Tartrate 25 Mg  Tabs (Metoprolol Tartrate) .Marland Kitchen.. 1by Mouth Two Times A Day 2)  Alprazolam 0.25 Mg  Tabs (Alprazolam) .Marland Kitchen.. 1  By Mouth Three Times A Day 3)  Hydrochlorothiazide 25 Mg  Tabs (Hydrochlorothiazide) .... Once Daily 4)  Altace 10 Mg  Caps (Ramipril) .... Once Daily 5)  Lipitor 20 Mg  Tabs (Atorvastatin Calcium) .... Once Daily 6)  Warfarin Sodium 2.5 Mg Tabs (Warfarin Sodium) .... Use As Directed By Anticoagualtion Clinic 7)  Nexium 40 Mg  Cpdr (Esomeprazole Magnesium) .Marland Kitchen.. 1 Cap Two Times A Day For Gerd 8)  Vitamin D (Ergocalciferol) 50000 Unit Caps (Ergocalciferol) .Marland Kitchen.. 1 Weekly For 12 Weeks 9)  Estrace 2 Mg Tabs (Estradiol) .Marland Kitchen.. 1 By Mouth Once Daily  Allergies (verified): 1)  ! Sulfa 2)  ! * Enablex  Past History:  Past Medical  History: Reviewed history from 02/11/2009 and no changes required. 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 PAROXYSMAL ATRIAL FIBRILLATION (ICD-427.31) MITRAL REGURGITATION, MILD (ICD-396.3) MITRAL VALVE PROLAPSE, HX OF (ICD-V12.50) HYPERTENSION (ICD-401.9) HYPERLIPIDEMIA (ICD-272.4) VITAMIN D DEFICIENCY (ICD-268.9) PNEUMONIA (ICD-486) TRACHEITIS (ICD-464.10) COUGH (ICD-786.2) UNSTEADY GAIT (ICD-781.2) DIARRHEA (ICD-787.91) IRON DEFICIENCY ANEMIA SECONDARY TO BLOOD LOSS (ICD-280.0) PNEUMONIA, ORGANISM UNSPECIFIED (ICD-486) MELENA (ICD-578.1) IBS (ICD-564.1) INCONTINENCE (ICD-788.30) CONSTIPATION (ICD-564.00) GERD (ICD-530.81) OSTEOPENIA (ICD-733.90) UTI (ICD-599.0) HYPOTHYROIDISM (ICD-244.9) UNS ADVRS EFF OTH RX MEDICINAL&BIOLOGICAL SBSTNC (ICD-995.29) COLONIC POLYPS, HX OF (ICD-V12.72) PAP 2009 MAMMOGRAM 2009 EYE EXAM 2009 EKG per Dr. Daleen Squibb DEXA-BONE DENSITY Appt 02/03/08 PNEUMONIA VACCINE 2008 SHINGLES VACCINE refuses DT Colon scopy Dr Arlyce Dice 2008 SMOKER never  Diverticulosis  Past Surgical History: Reviewed history from 02/18/2009 and no changes required. Hysterectomy left breast bx cardiac ablation  Family History: Reviewed history from 02/18/2009 and no changes required. Mother died of diabetes and CVA at age 75.  Father died  of complications of COPD at age 25.  She had a __________ at 8.  He had  COPD, but she is not sure why he died. No FH of Colon Cancer: Family History of Diabetes: Mother Family History of Heart Disease: Maternal Aunts and Uncles  Social History: Reviewed history from 02/18/2009 and no changes required.  She lives in [place] with her daughter.    She is active but does not routine  exercise. Occupation: Retired Patient has never smoked.  Alcohol Use - no Illicit Drug Use - no  Review of  Systems       The patient complains of arthritis/joint pain, change in vision, heart rhythm  changes, night sweats, swelling of feet/legs, urination - excessive, and urine leakage.  The patient denies allergy/sinus, anemia, anxiety-new, back pain, blood in urine, breast changes/lumps, confusion, cough, coughing up blood, depression-new, fainting, fatigue, fever, headaches-new, hearing problems, heart murmur, itching, menstrual pain, muscle pains/cramps, nosebleeds, pregnancy symptoms, shortness of breath, skin rash, sleeping problems, sore throat, swollen lymph glands, thirst - excessive , urination - excessive , urination changes/pain, vision changes, and voice change.    Vital Signs:  Patient profile:   77 year old female Height:      60 inches Weight:      133.25 pounds BMI:     26.12 Pulse rate:   64 / minute Pulse rhythm:   regular BP sitting:   130 / 60  (left arm) Cuff size:   regular  Vitals Entered By: June McMurray CMA Duncan Dull) (April 11, 2009 9:57 AM)   Impression & Recommendations:  Problem # 1:  ESOPHAGEAL STRICTURE (ICD-530.3) I am suspicious that she has a motility disorder such as achalasia.  Recommendations #1 barium swallow  Problem # 2:  COUMADIN THERAPY (ICD-V58.61) Assessment: Comment Only  Other Orders: Barium Swallow (Barium Swallow)  Patient Instructions: 1)  Go for your Barium Swallow Test at [facility] on 04/15/2009 arive at 1:15pm. 2)  Copy sent to : Dianna Limbo, MD 3)  The medication list was reviewed and reconciled.  All changed / newly prescribed medications were explained.  A complete medication list was provided to the patient / caregiver.  Appended Document: f/u--ch. reviewed

## 2010-02-02 NOTE — Assessment & Plan Note (Signed)
Summary: POST BA ESOPHAGRAM AND MANOMETRY        [name] [name]   History of Present Illness Visit Type: Follow-up Visit Primary GI MD: Melvia Heaps MD [facility] Primary Provider: Dianna Limbo, MD Chief Complaint: discuss manometry results, pt states she still has solid food dysphagia History of Present Illness:   [name] [name] has returned for followup of her dysphagia.  Barium swallow  demonstrated a  large distal esophageal diverticulum and a stenotic area at the GE junction.  Esophageal  manometry showed no peristaltic contractions.  The LES  was not measured because the catheter could not be passed through this area.  She continues to complain of dysphagia.   GI Review of Systems    Reports dysphagia with solids.      Denies abdominal pain, acid reflux, belching, bloating, chest pain, dysphagia with liquids, heartburn, loss of appetite, nausea, vomiting, vomiting blood, weight loss, and  weight gain.        Denies anal fissure, black tarry stools, change in bowel habit, constipation, diarrhea, diverticulosis, fecal incontinence, heme positive stool, hemorrhoids, irritable bowel syndrome, jaundice, light color stool, liver problems, rectal bleeding, and  rectal pain.    Current Medications (verified): 1)  Metoprolol Tartrate 25 Mg  Tabs (Metoprolol Tartrate) .Marland Kitchen.. 1by Mouth Two Times A Day 2)  Alprazolam 0.25 Mg  Tabs (Alprazolam) .Marland Kitchen.. 1  By Mouth Three Times A Day 3)  Hydrochlorothiazide 25 Mg  Tabs (Hydrochlorothiazide) .... Once Daily 4)  Altace 10 Mg  Caps (Ramipril) .... Once Daily 5)  Lipitor 20 Mg  Tabs (Atorvastatin Calcium) .... Once Daily 6)  Warfarin Sodium 2.5 Mg Tabs (Warfarin Sodium) .... Use As Directed By Anticoagualtion Clinic 7)  Nexium 40 Mg  Cpdr (Esomeprazole Magnesium) .Marland Kitchen.. 1 Cap Two Times A Day For Gerd 8)  Vitamin D (Ergocalciferol) 50000 Unit Caps (Ergocalciferol) .Marland Kitchen.. 1 Weekly For 12 Weeks 9)  Estrace 2 Mg Tabs (Estradiol) .Marland Kitchen.. 1 By Mouth Once Daily  Allergies: 1)   ! Sulfa 2)  ! Enablex (Darifenacin Hydrobromide)  Past History:  Past Medical History: Reviewed history from 02/11/2009 and no changes required. 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 PAROXYSMAL ATRIAL FIBRILLATION (ICD-427.31) MITRAL REGURGITATION, MILD (ICD-396.3) MITRAL VALVE PROLAPSE, HX OF (ICD-V12.50) HYPERTENSION (ICD-401.9) HYPERLIPIDEMIA (ICD-272.4) VITAMIN D DEFICIENCY (ICD-268.9) PNEUMONIA (ICD-486) TRACHEITIS (ICD-464.10) COUGH (ICD-786.2) UNSTEADY GAIT (ICD-781.2) DIARRHEA (ICD-787.91) IRON DEFICIENCY ANEMIA SECONDARY TO BLOOD LOSS (ICD-280.0) PNEUMONIA, ORGANISM UNSPECIFIED (ICD-486) MELENA (ICD-578.1) IBS (ICD-564.1) INCONTINENCE (ICD-788.30) CONSTIPATION (ICD-564.00) GERD (ICD-530.81) OSTEOPENIA (ICD-733.90) UTI (ICD-599.0) HYPOTHYROIDISM (ICD-244.9) UNS ADVRS EFF OTH RX MEDICINAL&BIOLOGICAL SBSTNC (ICD-995.29) COLONIC POLYPS, HX OF (ICD-V12.72) PAP 2009 MAMMOGRAM 2009 EYE EXAM 2009 EKG per Dr. Daleen Squibb DEXA-BONE DENSITY Appt 02/03/08 PNEUMONIA VACCINE 2008 SHINGLES VACCINE refuses DT Colon scopy Dr Arlyce Dice 2008 SMOKER never  Diverticulosis  Past Surgical History: Reviewed history from 02/18/2009 and no changes required. Hysterectomy left breast bx cardiac ablation  Family History: Reviewed history from 02/18/2009 and no changes required. Mother died of diabetes and CVA at age 5.  Father died  of complications of COPD at age 16.  She had a __________ at 23.  He had  COPD, but she is not sure why he died. No FH of Colon Cancer: Family History of Diabetes: Mother Family History of Heart Disease: Maternal Aunts and Uncles  Social History: Reviewed history from 02/18/2009 and no changes required.  She lives in [place] with her daughter.    She is active but does not routine  exercise.  Occupation: Retired Patient has never smoked.  Alcohol Use - no Illicit Drug Use - no  Review of Systems  The  patient denies allergy/sinus, anemia, anxiety-new, arthritis/joint pain, back pain, blood in urine, breast changes/lumps, confusion, cough, coughing up blood, depression-new, fainting, fatigue, fever, headaches-new, hearing problems, heart murmur, heart rhythm changes, itching, menstrual pain, muscle pains/cramps, night sweats, nosebleeds, pregnancy symptoms, shortness of breath, skin rash, sleeping problems, sore throat, swelling of feet/legs, swollen lymph glands, thirst - excessive, urination - excessive, urination changes/pain, urine leakage, vision changes, and voice change.    Vital Signs:  Patient profile:   77 year old female Height:      60 inches Weight:      133 pounds BMI:     26.07 Pulse rate:   52 / minute Pulse rhythm:   regular  Vitals Entered By: Francee Piccolo CMA Duncan Dull) (May 18, 2009 10:40 AM)   Impression & Recommendations:  Problem # 1:  ACHALASIA (ICD-530.0) Patient has achalasia as demonstrated by her endoscopic studies, barium swallow and esophageal manometry  Recommendations #1 Botox injection at the LES  Coumadin will be held and advanced to the procedure provide a cardiology agrees.  Problem # 2:  COUMADIN THERAPY (ICD-V58.61) See assessment #1  Problem # 3:  PAROXYSMAL ATRIAL FIBRILLATION (ICD-427.31) Assessment: Comment Only  Patient Instructions: 1)  Copy sent to : [name]  Appended Document: Orders Update Clinical Lists Changes  Orders: Added new Test order of ZENDO with Botox (ZENDO/Botox) - Signed    Appended Document: POST BA ESOPHAGRAM AND MANOMETRY        [name] [name] reviewed

## 2010-02-02 NOTE — Procedures (Signed)
Summary: Order/[facility]  Order/[facility]   Imported By: Lester Bivalve 04/21/2009 08:23:39  _____________________________________________________________________  External Attachment:    Type:   Image     Comment:   External Document

## 2010-02-02 NOTE — Procedures (Signed)
Summary: Prep/[facility]  Prep/ [facility]astroenter[facility]   Imported By: Lester [place] 02/23/2009 08:49:53  _____________________________________________________________________  External Attachment:    Type:   Image     Comment:   External Document

## 2010-02-02 NOTE — Assessment & Plan Note (Signed)
Summary: 6 mo f/u ./cy    Visit Type:  6 mo f/u Referring Provider:  n/a Primary Provider:  Dianna Limbo, MD  CC:  pt states she was a little dizzy Monday when asked if she drinks enough water she states no was asked to increase her water intake especially in the summer time....denies any cardiac complaints today.  History of Present Illness: [name] [name] returns for further evaluation of her paroxysmal atrial fibrillation.  She complained of some dizziness earlier this week. He was not true vertigo. She was just sitting in her chair. There was no associated nausea, vomiting, sweats or chest pain. She denies any head injury.  She occasionally gets a little lightheaded when he stands up too quickly. She does not drink much water. She is on diuretic.  She denies any chest pain, palpitations, syncope.  Medications reviewed. She is very compliant.  She is on low dose HCTZ 25 mg per day.she denies any blood loss for melena.  Current Medications (verified): 1)  Metoprolol Tartrate 25 Mg  Tabs (Metoprolol Tartrate) .Marland Kitchen.. 1by Mouth Two Times A Day 2)  Alprazolam 0.25 Mg  Tabs (Alprazolam) .Marland Kitchen.. 1  By Mouth Three Times A Day 3)  Hydrochlorothiazide 25 Mg  Tabs (Hydrochlorothiazide) .... Once Daily 4)  Altace 10 Mg  Caps (Ramipril) .... Once Daily 5)  Lipitor 20 Mg  Tabs (Atorvastatin Calcium) .... Once Daily 6)  Warfarin Sodium 2.5 Mg Tabs (Warfarin Sodium) .... Use As Directed By Anticoagualtion Clinic 7)  Nexium 40 Mg  Cpdr (Esomeprazole Magnesium) .Marland Kitchen.. 1 Cap Two Times A Day For Gerd 8)  Estrace 2 Mg Tabs (Estradiol) .Marland Kitchen.. 1 By Mouth Once Daily  Allergies: 1)  ! Sulfa 2)  ! 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 PAROXYSMAL ATRIAL FIBRILLATION (ICD-427.31) MITRAL REGURGITATION, MILD (ICD-396.3) MITRAL VALVE PROLAPSE, HX OF  (ICD-V12.50) HYPERTENSION (ICD-401.9) HYPERLIPIDEMIA (ICD-272.4) VITAMIN D DEFICIENCY (ICD-268.9) PNEUMONIA (ICD-486) TRACHEITIS (ICD-464.10) COUGH (ICD-786.2) UNSTEADY GAIT (ICD-781.2) DIARRHEA (ICD-787.91) IRON DEFICIENCY ANEMIA SECONDARY TO BLOOD LOSS (ICD-280.0) PNEUMONIA, ORGANISM UNSPECIFIED (ICD-486) MELENA (ICD-578.1) IBS (ICD-564.1) INCONTINENCE (ICD-788.30) CONSTIPATION (ICD-564.00) GERD (ICD-530.81) OSTEOPENIA (ICD-733.90) UTI (ICD-599.0) HYPOTHYROIDISM (ICD-244.9) UNS ADVRS EFF OTH RX MEDICINAL&BIOLOGICAL SBSTNC (ICD-995.29) COLONIC POLYPS, HX OF (ICD-V12.72) PAP 2009 MAMMOGRAM 2009 EYE EXAM 2009 EKG per Dr. Daleen Squibb DEXA-BONE DENSITY Appt 02/03/08 PNEUMONIA VACCINE 2008 SHINGLES VACCINE refuses DT Colon scopy Dr Arlyce Dice 2008 SMOKER never  Diverticulosis  Past Surgical History: Last updated: 02/19/09 Hysterectomy left breast bx cardiac ablation  Family History: Last updated: 02-19-2009 Mother died of diabetes and CVA at age 61.  Father died  of complications of COPD at age 88.  She had a __________ at 15.  He had  COPD, but she is not sure why he died. No FH of Colon Cancer: Family History of Diabetes: Mother Family History of Heart Disease: Maternal Aunts and Uncles  Social History: Last updated: February 19, 2009  She lives in [place] with her daughter.    She is active but does not routine  exercise. Occupation: Retired Patient has never smoked.  Alcohol Use - no Illicit Drug Use - no  Risk Factors: Smoking Status: never (02/19/09)  Review of Systems       negative other than history of present illness  Vital Signs:  Patient profile:   77 year old female Height:      60 inches Weight:      131 pounds BMI:  25.68 Pulse rate:   61 / minute Pulse (ortho):   61 / minute Pulse rhythm:   regular BP sitting:   128 / 56  (left arm) BP standing:   118 / 73 Cuff size:   large  Vitals Entered By: Danielle Rankin, CMA (August 03, 2009 2:30  PM)  Serial Vital Signs/Assessments:  Time      Position  BP       Pulse  Resp  Temp     By 2:52 PM   Lying [place]  115/62   55                    Danielle Rankin, CMA 2:54 PM   Sitting   123/68   [address] [place] 2:55 PM   Standing  118/73   61                    Danielle Rankin, [place] 2:57 PM   Standing  122/67   60                    Danielle Rankin, [place] 2:59 PM   Standing  109/67   60                    Danielle Rankin, [place]  Comments: 2:52 PM no sxms By: Danielle Rankin, CMA  2:54 PM vision blurry By: Danielle Rankin, CMA  2:55 PM vision blurry pt states last eye exam 2009....little dizziness By: Danielle Rankin, CMA  2:57 PM no sxms By: Danielle Rankin, CMA  2:59 PM no sxms By: Danielle Rankin, CMA    Physical Exam  General:  obese.   Head:  normocephalic and atraumatic Eyes:  wears glasses otherwise unremarkable Neck:  Neck supple, no JVD. No masses, thyromegaly or abnormal cervical nodes. Chest [name] [name]:  no deformities or breast masses noted Lungs:  Clear bilaterally to auscultation and percussion. Heart:  Non-displaced PMI, chest non-tender; regular rate and rhythm, S1, S2 without murmurs, rubs or gallops. Carotid upstroke normal, no bruit. Normal abdominal aortic size, no bruits. Pedals normal pulses.  Msk:  decreased ROM.   Pulses:  pulses normal in all 4 extremities Extremities:  No clubbing or cyanosis. Neurologic:  Alert and oriented x 3. Skin:  Intact without lesions or rashes. Psych:  Normal affect.   Problems:  Medical Problems Added: 1)  Dx of Dizziness  (ICD-780.4)  Impression & Recommendations:  Problem # 1:  PAROXYSMAL ATRIAL FIBRILLATION (ICD-427.31)  Her updated medication list for this problem includes:    Metoprolol Tartrate 25 Mg Tabs (Metoprolol tartrate) .Marland KitchenMarland KitchenMarland KitchenMarland Kitchen 1by mouth two times a day    Warfarin Sodium 2.5 Mg Tabs (Warfarin sodium) ..... Use as directed by anticoagualtion clinic  Problem # 2:  COUMADIN THERAPY (ICD-V58.61)  Problem # 3:  MITRAL  REGURGITATION, MILD (ICD-396.3) Assessment: Unchanged  Problem # 4:  MITRAL VALVE PROLAPSE, HX OF (ICD-V12.50) Assessment: Unchanged  Problem # 5:  HYPERTENSION (ICD-401.9)  Her updated medication list for this problem includes:    Metoprolol Tartrate 25 Mg Tabs (Metoprolol tartrate) .Marland KitchenMarland KitchenMarland KitchenMarland Kitchen 1by mouth two times a day    Hydrochlorothiazide 25 Mg Tabs (Hydrochlorothiazide) ..... Once daily    Altace 10 Mg Caps (Ramipril) ..... Once daily  Problem # 6:  HYPERLIPIDEMIA (ICD-272.4)  Her updated medication list for this problem includes:  Lipitor 20 Mg Tabs (Atorvastatin calcium) ..... Once daily  Problem # 7:  DIZZINESS (ICD-780.4) Assessment: New She is not clearly orthostatic. We'll make no changes in her medical program but urged her to drink plenty of water and to get up slowly. Avoiding injury or falls was reinforced. She understands.  Patient Instructions: 1)  Your physician recommends that you schedule a follow-up appointment in: 6 months with Dr. Daleen Squibb 2)  Your physician recommends that you continue on your current medications as directed. Please refer to the Current Medication list given to you today.

## 2010-02-02 NOTE — Letter (Signed)
Summary: Generic Letter  [place] at [facility]  [address] [place], [place] [address]   Phone: [phone]  Fax: [phone]    02/16/2009  [facility] [address] [place], [place]  [address]  Dear Ms. Charpentier,   Hemocult cards were all negative.        Sincerely,     Dr Gwenyth Bender Stafford,MD

## 2010-02-02 NOTE — Procedures (Signed)
Summary: Instructions/[facility]  Instructions/ [facility]astroenter[facility]   Imported By: Lester Badger 04/21/2009 08:24:52  _____________________________________________________________________  External Attachment:    Type:   Image     Comment:   External Document

## 2010-02-02 NOTE — Progress Notes (Signed)
Summary: Alprazolam refill  Phone Note Refill Request Message from:  Fax from Pharmacy on November 16, 2009 5:17 PM  Refills Requested: Medication #1:  ALPRAZOLAM 0.25 MG  TABS 1  by mouth three times a day   Dosage confirmed as above?Dosage Confirmed Please advise refill?   Initial call taken by: Josph Macho RMA,  November 16, 2009 5:17 PM  Follow-up for Phone Call        not due until 12/18 can discuss with Dr Scotty Court when he returns Follow-up by: Danise Edge MD,  November 17, 2009 8:45 AM  Additional Follow-up for Phone Call Additional follow up Details #1::        Left message on vm at [facility] Additional Follow-up by: Josph Macho RMA,  November 17, 2009 10:17 AM

## 2010-02-02 NOTE — Procedures (Signed)
Summary: EGD Report   EGD  Procedure date:  03/21/2006  Findings:      Findings: Stricture:  Location: [facility]    EGD  Procedure date:  03/21/2006  Findings:      Findings: Stricture:  Location: [facility]   Patient Name: [name], [name] MRN:  Procedure Procedures: Panendoscopy (EGD) CPT: 43235.    with esophageal dilation. CPT: [id number].  Personnel: Endoscopist: Barbette Hair. Arlyce Dice, MD.  Patient Consent: Procedure, Alternatives, Risks and Benefits discussed, consent obtained, from patient.  Indications Symptoms: Dysphagia.  History  Current Medications: Patient is not currently taking Coumadin.  Comments: Patient history reviewed and updated, pre-procedure physical performed prior to initiation of sedation? Pre-Exam Physical: Performed Mar 21, 2006  Entire physical exam was normal. Cardio- pulmonary exam, HEENT exam, Abdominal exam, Mental status exam WNL.  Comments: Patient history reviewed and updated, pre-procedure physical performed prior to initiation of sedation? Exam Exam Info: Maximum depth of insertion Duodenum, intended Duodenum. Vocal cords visualized. Gastric retroflexion performed. ASA Classification: II. Tolerance: good.  Sedation Meds: Residual sedation present from prior procedure today. Robinul 0.2 given IV. Fentanyl given IV. Versed given IV. Cetacaine Spray 2 sprays given aerosolized. Mylicon 1 cc  Monitoring: BP and pulse monitoring done. Oximetry used. Supplemental O2 given at 2 Liters.  Findings OTHER FINDING: Large diverticulum at 35 cm from incisors in Distal Esophagus.  STRICTURE / STENOSIS: Stricture in Distal Esophagus.  38 cm from mouth. ICD9: Esophageal Stricture: 530.3. Comment: Scope passes through with mild pressure.  - Dilation: Distal Esophagus. Balloon/Microvasive dilator used, Diameter: 15-16.5-18 mm, Moderate Resistance, Moderate Heme present on extraction. Outcome: successful.  HIATAL HERNIA:  Regular, 6 cms. in length.  - Normal: Fundus to Duodenal 2nd Portion.   Assessment Abnormal examination, see findings above.  Diagnoses: 530.3: Esophageal Stricture.   Events  Unplanned Intervention: No unplanned interventions were required.  Unplanned Events: There were no complications. Plans Instructions: Restart medications: Coumadin in am.  Medication(s): Continue current medications.  Patient Education: Patient given standard instructions for: Stenosis / Stricture.  Disposition: After procedure patient sent to recovery. After recovery patient sent home.  Scheduling: Office Visit, to [employer]. Arlyce Dice, MD, around Mar 31, 2006.  Barium Swallow, around Mar 26, 2006.    cc:   The Patient   Dianna Limbo, MD  This report was created from the original endoscopy report, which was reviewed and signed by the above listed endoscopist.

## 2010-02-02 NOTE — Medication Information (Signed)
Summary: rov/tm  Anticoagulant Therapy  Managed by: Cloyde Reams, RN, BSN Referring MD: Valera Castle MD PCP: Dianna Limbo, MD Supervising MD: Tenny Craw MD, Gunnar Fusi Indication 1: Atrial Fibrillation (ICD-427.31) Lab Used: [facility] Site: [facility] INR POC 1.9 INR RANGE 2 - 3  Dietary changes: no    Health status changes: no    Bleeding/hemorrhagic complications: no    Recent/future hospitalizations: no    Any changes in medication regimen? no    Recent/future dental: no  Any missed doses?: no       Is patient compliant with meds? yes       Allergies: 1)  ! Sulfa 2)  ! * Enablex  Anticoagulation Management History:      The patient is taking warfarin and comes in today for a routine follow up visit.  Positive risk factors for bleeding include an age of 75 years or older and history of GI bleeding.  The bleeding index is 'intermediate risk'.  Positive CHADS2 values include History of HTN and Age > 48 years old.  The start date was 01/03/2006.  Her last INR was 2.5.  Anticoagulation responsible provider: Tenny Craw MD, Gunnar Fusi.  INR POC: 1.9.  Cuvette Lot#: [id number].  Exp: 05/2010.    Anticoagulation Management Assessment/Plan:      The patient's current anticoagulation dose is Warfarin sodium 2.5 mg tabs: Use as directed by Anticoagualtion Clinic.  The target INR is 2 - 3.  The next INR is due 04/22/2009.  Anticoagulation instructions were given to patient.  Results were reviewed/authorized by Cloyde Reams, RN, BSN.  She was notified by Cloyde Reams RN.         Prior Anticoagulation Instructions: INR 1.5 Today take extra 1/2 pill then resume 1 pill everyday except 1/2 pill on Mondays. Recheck in 2 weeks.   Current Anticoagulation Instructions: INR 1.9  Take an extra 1/2 tablet today then resume same dosage 1 tablet daily except 1/2 tablet on Mondays.  Recheck in 3 weeks.

## 2010-02-02 NOTE — Procedures (Signed)
Summary: EGD   EGD  Procedure date:  03/04/2009  Findings:      Location: [facility]   ENDOSCOPY PROCEDURE REPORT  PATIENT:  [name] [name], [name] [name]  MR#:  [id number] BIRTHDATE:   1924-04-06, 85 yrs. old   GENDER:   female  ENDOSCOPIST:   Barbette Hair. Arlyce Dice, MD Referred by:   PROCEDURE DATE:  03/04/2009 PROCEDURE:  EGD with balloon dilatation ASA CLASS:   Class III INDICATIONS: dysphagia   MEDICATIONS:    Fentanyl 75 mcg, Versed 5 mg, glycopyrrolate (Robinal) 0.2 mg TOPICAL ANESTHETIC:   Cetacaine Spray  DESCRIPTION OF PROCEDURE:   After the risks benefits and alternatives of the procedure were thoroughly explained, informed consent was obtained.  The  endoscope was introduced through the mouth and advanced to the third portion of the duodenum, without limitations.  The instrument was slowly withdrawn as the mucosa was fully examined. <<PROCEDUREIMAGES>>  <<OLD IMAGES>>  A diverticulum was found in the distal esophagus. Large diverticulum 33cm from incisors  A stricture was found in the distal esophagus. Stenotic area adjacent to diverticulm; stricture at GE junction. Both stenotic areas and stricture at GE junction dilated to 18mm balloon dilation 15-16 (see image1 and image2).5-18; moderate resistance; minor heme  Otherwise the examination was normal.    Retroflexed views revealed no abnormalities.    The scope was then withdrawn from the patient and the procedure completed.  COMPLICATIONS:   None  ENDOSCOPIC IMPRESSION:  1) Diverticulum in the distal esophagus  2) Stricture in the distal esophagus - s/p balloon dilitation  3) Otherwise normal examination  Findings suggest a motility disorder    RECOMMENDATIONS:  1) Call office next 2-3 days to schedule an office appointment for 2 weeks  REPEAT EXAM:   No   _______________________________ Barbette Hair. Arlyce Dice, MD    CC: Dianna Limbo, MD

## 2010-02-02 NOTE — Procedures (Signed)
Summary: [facility]  [facility]   Imported By: Lester Thermalito 05/23/2009 07:35:33  _____________________________________________________________________  External Attachment:    Type:   Image     Comment:   External Document

## 2010-02-02 NOTE — Assessment & Plan Note (Signed)
Summary: melena/dm   Vital Signs:  Patient profile:   77 year old female Weight:      129 pounds Temp:     97.7 degrees F oral BP sitting:   150 / 76  (right arm)  Vitals Entered By: Alfred Levins, CMA (December 19, 2009 2:31 PM) CC: diarrhea x3 days   Primary Care Spyridon Hornstein:  Dianna Limbo, MD  CC:  diarrhea x3 days.  History of Present Illness: patient presents to clinic as a work in for evaluation of possible melena.  Lips 3 day history of dark bowel movements which began with fecal urgency and loose stools.  Denies hematochezia or abdominal pain.  No vomiting or hematemesis.  Had only one bowel movement yesterday and feels better today. denies fever chills dizziness or syncope.  Does take Coumadin for history atrial fibrillation with last known INR 2.1 on December 9. appetite initially depressed but now improving.  Colonoscopy 2008 review demonstrate diverticulosis.  Underwent EGD June 2011 for evaluation of possible achalasia.  Also notes history of chronic anxiety for which she takes Xanax.  Requests refill.  No indication of tolerance or addiction.  Current Medications (verified): 1)  Metoprolol Tartrate 25 Mg  Tabs (Metoprolol Tartrate) .Marland Kitchen.. 1by Mouth Two Times A Day 2)  Alprazolam 0.25 Mg  Tabs (Alprazolam) .Marland Kitchen.. 1  By Mouth Three Times A Day 3)  Hydrochlorothiazide 25 Mg  Tabs (Hydrochlorothiazide) .... Once Daily 4)  Altace 10 Mg  Caps (Ramipril) .... Once Daily 5)  Lipitor 20 Mg  Tabs (Atorvastatin Calcium) .... Once Daily 6)  Warfarin Sodium 2.5 Mg Tabs (Warfarin Sodium) .... Use As Directed By Anticoagualtion Clinic 7)  Nexium 40 Mg  Cpdr (Esomeprazole Magnesium) .Marland Kitchen.. 1 Cap Two Times A Day For Gerd 8)  Estrace 2 Mg Tabs (Estradiol) .Marland Kitchen.. 1 By Mouth Once Daily  Allergies (verified): 1)  ! Sulfa 2)  ! Enablex (Darifenacin Hydrobromide)  Review of Systems      See HPI  Physical Exam  General:  Well-developed,well-nourished,in no acute distress; alert,appropriate and  cooperative throughout examination Head:  Normocephalic and atraumatic without obvious abnormalities. No apparent alopecia or balding. Eyes:  pupils equal, pupils round, corneas and lenses clear, and no injection.   Ears:  no external deformities.   Abdomen:  Bowel sounds positive,abdomen soft and non-tender without masses, organomegaly or hernias noted. Rectal:  with female nurse escort exam is performed. Large rectocele noted.no hemorrhoids, normal sphincter tone, no tenderness, and no fissures.  no rectal masses.  dark stool in the vault noted.  Heme negative.   Impression & Recommendations:  Problem # 1:  MELENA (ICD-578.1) no evidence of gross active bleeding however with chronic anticoagulation will proceed with CBC. Follow closely or present to the emergency department with any further concerns of bleeding dizziness weakness or abdominal pain.  States understanding and agreement. Orders: TLB-CBC Platelet - w/Differential (85025-[id number]) Specimen Handling (14782) Venipuncture (95621)  Problem # 2:  ANXIETY (ICD-300.00) chronic and stable.  Refill Xanax.  Her updated medication list for this problem includes:    Alprazolam 0.25 Mg Tabs (Alprazolam) .Marland Kitchen... 1  by mouth three times a day  Complete Medication List: 1)  Metoprolol Tartrate 25 Mg Tabs (Metoprolol tartrate) .Marland Kitchen.. 1by mouth two times a day 2)  Alprazolam 0.25 Mg Tabs (Alprazolam) .Marland Kitchen.. 1  by mouth three times a day 3)  Hydrochlorothiazide 25 Mg Tabs (Hydrochlorothiazide) .... Once daily 4)  Altace 10 Mg Caps (Ramipril) .... Once daily 5)  Lipitor 20 Mg  Tabs (Atorvastatin calcium) .... Once daily 6)  Warfarin Sodium 2.5 Mg Tabs (Warfarin sodium) .... Use as directed by anticoagualtion clinic 7)  Nexium 40 Mg Cpdr (Esomeprazole magnesium) .Marland Kitchen.. 1 cap two times a day for gerd 8)  Estrace 2 Mg Tabs (Estradiol) .Marland Kitchen.. 1 by mouth once daily Prescriptions: ALPRAZOLAM 0.25 MG  TABS (ALPRAZOLAM) 1  by mouth three times a day  #90 x 2    Entered and Authorized by:   Edwyna Perfect MD   Signed by:   Edwyna Perfect MD on 12/19/2009   Method used:   Print then Give to Patient   RxID:   [id number]    Orders Added: 1)  TLB-CBC Platelet - w/Differential [[id number]] 2)  Specimen Handling [99000] 3)  Venipuncture [14782] 4)  Est. Patient Level IV [95621]

## 2010-02-02 NOTE — Medication Information (Signed)
Summary: rov/ewj  Anticoagulant Therapy  Managed by: Bethena Midget, RN, BSN Referring MD: Valera Castle MD PCP: Dianna Limbo, MD Supervising MD: Shirlee Latch MD, Ainsleigh Kakos Indication 1: Atrial Fibrillation (ICD-427.31) Lab Used: [facility] Site: [facility] INR POC 2.4 INR RANGE 2 - 3  Dietary changes: no    Health status changes: no    Bleeding/hemorrhagic complications: no    Recent/future hospitalizations: no    Any changes in medication regimen? yes       Details: Estrogen  QOD- new med  Recent/future dental: no  Any missed doses?: no       Is patient compliant with meds? yes       Allergies: 1)  ! Sulfa 2)  ! * Enablex  Anticoagulation Management History:      The patient is taking warfarin and comes in today for a routine follow up visit.  Positive risk factors for bleeding include an age of 75 years or older and history of GI bleeding.  The bleeding index is 'intermediate risk'.  Positive CHADS2 values include History of HTN and Age > 25 years old.  The start date was 01/03/2006.  Her last INR was 2.5.  Anticoagulation responsible provider: Shirlee Latch MD, Maddex Garlitz.  INR POC: 2.4.  Cuvette Lot#: [id number].  Exp: 06/2010.    Anticoagulation Management Assessment/Plan:      The patient's current anticoagulation dose is Warfarin sodium 2.5 mg tabs: Use as directed by Anticoagualtion Clinic.  The target INR is 2 - 3.  The next INR is due 05/18/2009.  Anticoagulation instructions were given to patient.  Results were reviewed/authorized by Bethena Midget, RN, BSN.  She was notified by Bethena Midget, RN, BSN.         Prior Anticoagulation Instructions: INR 1.9  Take an extra 1/2 tablet today then resume same dosage 1 tablet daily except 1/2 tablet on Mondays.  Recheck in 3 weeks.    Current Anticoagulation Instructions: INR 2.4 Continue 1pill everyday except 1/2 pill on Mondays. Recheck in 4 weeks.

## 2010-02-02 NOTE — Assessment & Plan Note (Signed)
Summary: emp/pt coming in fasting/cjr   Vital Signs:  Patient profile:   77 year old female Height:      60 inches Weight:      132 pounds BMI:     25.87 O2 Sat:      97 % Temp:     98 degrees F Pulse rate:   67 / minute BP sitting:   140 / 80  (left arm) Cuff size:   regular  Vitals Entered By: Pura Spice, RN (January 19, 2009 10:16 AM) CC: go over prblems refill meds fasting for labs  went to ER week ago had 'mild UTI" and was put on generic Ceftin  enablex makes her dizzy so she stopped  Is Patient Diabetic? No   History of Present Illness: this 77 year old white female is in today to discuss her medical problems and refill her meds are medications. She relates that one week ago she was the emergency room and treated for a urinary tract infection with Ceftin however the septum caused diarrhea the patient was unable to take it She is a CAD patient in the care of Dr. wall. On questioning she relates that her GERD is much worse and is non-responsive to Nexium 40 mg b.i.d. and she is on then taken one. And in addition to the symptoms the patient is also becoming choked with certain solids at x18 with liquid Hypertension his controlled She relates she was unable to take Enablex for her urinary urgency because the medication made her dizzy. She has a 51 year old daughter who has severe COPD and this causes the patient considerable stress having his control over her well with alprazolam 0.25 mg t.i.d.  Allergies: 1)  ! Sulfa 2)  ! 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 PAROXYSMAL ATRIAL FIBRILLATION (ICD-427.31) MITRAL REGURGITATION, MILD (ICD-396.3) MITRAL VALVE PROLAPSE, HX OF (ICD-V12.50) HYPERTENSION (ICD-401.9) HYPERLIPIDEMIA (ICD-272.4) VITAMIN D DEFICIENCY (ICD-268.9) PNEUMONIA (ICD-486) TRACHEITIS (ICD-464.10) COUGH (ICD-786.2) UNSTEADY GAIT  (ICD-781.2) DIARRHEA (ICD-787.91) IRON DEFICIENCY ANEMIA SECONDARY TO BLOOD LOSS (ICD-280.0) PNEUMONIA, ORGANISM UNSPECIFIED (ICD-486) MELENA (ICD-578.1) IBS (ICD-564.1) INCONTINENCE (ICD-788.30) CONSTIPATION (ICD-564.00) GERD (ICD-530.81) OSTEOPENIA (ICD-733.90) UTI (ICD-599.0) HYPOTHYROIDISM (ICD-244.9) UNS ADVRS EFF OTH RX MEDICINAL&BIOLOGICAL SBSTNC (ICD-995.29) COLONIC POLYPS, HX OF (ICD-V12.72) PAP 2009 MAMMOGRAM 2009 EYE EXAM 2009 EKG per Dr. Daleen Squibb DEXA-BONE DENSITY Appt 02/03/08 PNEUMONIA VACCINE 2008 SHINGLES VACCINE refuses DT Colon scopy Dr Arlyce Dice 2008 SMOKER never   Past Surgical History: Last updated: 08/20/2008 Hysterectomy left breast bx ablation  Social History: Last updated: 08/20/2008  She lives in [place] with her daughter.  She works  as a Conservation officer, nature at [employer].  She denies any tobacco, alcohol, or drug  use now or ever in her lifetime.  She is active but does not routine  exercise.  Risk Factors: Smoking Status: never (01/29/2007)  Past History:  Care Management: Cardiology:Dr Wall   Review of Systems      See HPI General:  See HPI; Denies chills, fatigue, fever, loss of appetite, malaise, sleep disorder, sweats, weakness, and weight loss. Eyes:  Denies blurring, discharge, double vision, eye irritation, eye pain, halos, itching, light sensitivity, red eye, vision loss-1 eye, and vision loss-both eyes. ENT:  Denies decreased hearing, difficulty swallowing, ear discharge, earache, hoarseness, nasal congestion, nosebleeds, postnasal drainage, ringing in ears, sinus pressure, and sore throat. CV:  Complains of shortness of breath with exertion; patient is on Coumadin, previous history of atrial field  as well as mitral regurgitation and mitral valve prolapse. Resp:  Denies chest discomfort, chest pain with inspiration, cough, coughing up blood, excessive snoring, hypersomnolence, morning headaches, pleuritic, shortness of breath, sputum  productive, and wheezing. GI:  See HPI; Complains of indigestion; severe GERD and esophageal stricture. GU:  Complains of urinary frequency and urinary hesitancy. MS:  Complains of joint pain; control with Tylenol. Derm:  Denies changes in color of skin, changes in nail beds, dryness, excessive perspiration, flushing, hair loss, insect bite(s), itching, lesion(s), poor wound healing, and rash. Neuro:  Denies brief paralysis, difficulty with concentration, disturbances in coordination, falling down, headaches, inability to speak, memory loss, numbness, poor balance, seizures, sensation of room spinning, tingling, tremors, visual disturbances, and weakness. Psych:  Complains of anxiety; control with alprazolam.  Physical Exam  General:  Well-developed,well-nourished,in no acute distress; alert,appropriate and cooperative throughout examination Head:  Normocephalic and atraumatic without obvious abnormalities. No apparent alopecia or balding. Eyes:  No corneal or conjunctival inflammation noted. EOMI. Perrla. Funduscopic exam benign, without hemorrhages, exudates or papilledema. Vision grossly normal. Ears:  External ear exam shows no significant lesions or deformities.  Otoscopic examination reveals clear canals, tympanic membranes are intact bilaterally without bulging, retraction, inflammation or discharge. Hearing is grossly normal bilaterally. Nose:  nasal congestion with postnasal drainage clear Mouth:  Oral mucosa and oropharynx without lesions or exudates.  Teeth in good repair. Neck:  No deformities, masses, or tenderness noted. Chest Wall:  No deformities, masses, or tenderness noted. Breasts:  No mass, nodules, thickening, tenderness, bulging, retraction, inflamation, nipple discharge or skin changes noted.   Lungs:  Normal respiratory effort, chest expands symmetrically. Lungs are clear to auscultation, no crackles or wheezes. Heart:  precordial systolic murmur grade 2-3 Abdomen:   epigastric tenderness normal bowel soundsno guarding, no rigidity, no rebound tenderness, no abdominal hernia, no inguinal hernia, no hepatomegaly, and no splenomegaly.   Rectal:  4+ rectocele Genitalia:  absent uterus and rectocele, atrophic vaginal mucosa Msk:  No deformity or scoliosis noted of thoracic or lumbar spine.   Pulses:  R and L carotid,radial,femoral,dorsalis pedis and posterior tibial pulses are full and equal bilaterally Extremities:  No clubbing, cyanosis, edema, or deformity noted with normal full range of motion of all joints.   Neurologic:  No cranial nerve deficits noted. Station and gait are normal. Plantar reflexes are down-going bilaterally. DTRs are symmetrical throughout. Sensory, motor and coordinative functions appear intact.   Impression & Recommendations:  Problem # 1:  ANXIETY (ICD-300.00) Assessment Improved  Her updated medication list for this problem includes:    Alprazolam 0.25 Mg Tabs (Alprazolam) .Marland Kitchen... 1  by mouth three times a day  Problem # 2:  PAROXYSMAL ATRIAL FIBRILLATION (ICD-427.31) Assessment: Improved  Her updated medication list for this problem includes:    Metoprolol Tartrate 25 Mg Tabs (Metoprolol tartrate) .Marland KitchenMarland KitchenMarland KitchenMarland Kitchen 1by mouth two times a day    Warfarin Sodium 2.5 Mg Tabs (Warfarin sodium) ..... Use as directed by anticoagualtion clinic  Problem # 3:  MITRAL REGURGITATION, MILD (ICD-396.3) Assessment: Unchanged  Her updated medication list for this problem includes:    Metoprolol Tartrate 25 Mg Tabs (Metoprolol tartrate) .Marland KitchenMarland KitchenMarland KitchenMarland Kitchen 1by mouth two times a day    Warfarin Sodium 2.5 Mg Tabs (Warfarin sodium) ..... Use as directed by anticoagualtion clinic  Problem # 4:  HYPERTENSION (ICD-401.9) Assessment: Improved  Her updated medication list for this problem includes:    Metoprolol Tartrate 25 Mg Tabs (Metoprolol tartrate) .Marland KitchenMarland KitchenMarland KitchenMarland Kitchen 1by mouth two times a day  Hydrochlorothiazide 25 Mg Tabs (Hydrochlorothiazide) ..... Once daily    Altace 10  Mg Caps (Ramipril) ..... Once daily  Orders: TLB-TSH (Thyroid Stimulating Hormone) (84443-TSH)  Problem # 5:  HYPERLIPIDEMIA (ICD-272.4) Assessment: Improved  Her updated medication list for this problem includes:    Lipitor 20 Mg Tabs (Atorvastatin calcium) ..... Once daily  Orders: Venipuncture (45409) TLB-Lipid Panel (80061-LIPID) TLB-Hepatic/Liver Function Pnl (80076-HEPATIC) Prescription Created Electronically [phone])  Problem # 6:  GERD (ICD-530.81) Assessment: Deteriorated  Her updated medication list for this problem includes:    Nexium 40 Mg Cpdr (Esomeprazole magnesium) .Marland Kitchen... 1 cap two times a day for gerd  Orders: Gastroenterology Referral (GI)  Problem # 7:  OSTEOPENIA (ICD-733.90) Assessment: Unchanged  Her updated medication list for this problem includes:    Calcium 600/vitamin D 600-400 Mg-unit Tabs (Calcium carbonate-vitamin d) .Marland Kitchen... 1 bid    Vitamin D (ergocalciferol) 50000 Unit Caps (Ergocalciferol) .Marland Kitchen... 1 weekly for 12 weeks  Orders: T-Vitamin D (25-Hydroxy) [phone])  Problem # 8:  VITAMIN D DEFICIENCY (ICD-268.9) Assessment: Unchanged  Problem # 9:  UTI (ICD-599.0) Assessment: Improved  Orders: UA Dipstick w/o Micro (automated)  (81003)  Complete Medication List: 1)  Metoprolol Tartrate 25 Mg Tabs (Metoprolol tartrate) .Marland Kitchen.. 1by mouth two times a day 2)  Alprazolam 0.25 Mg Tabs (Alprazolam) .Marland Kitchen.. 1  by mouth three times a day 3)  Hydrochlorothiazide 25 Mg Tabs (Hydrochlorothiazide) .... Once daily 4)  Altace 10 Mg Caps (Ramipril) .... Once daily 5)  Lipitor 20 Mg Tabs (Atorvastatin calcium) .... Once daily 6)  Warfarin Sodium 2.5 Mg Tabs (Warfarin sodium) .... Use as directed by anticoagualtion clinic 7)  Nexium 40 Mg Cpdr (Esomeprazole magnesium) .Marland Kitchen.. 1 cap two times a day for gerd 8)  Calcium 600/vitamin D 600-400 Mg-unit Tabs (Calcium carbonate-vitamin d) .Marland Kitchen.. 1 bid 9)  Vitamin D (ergocalciferol) 50000 Unit Caps (Ergocalciferol) .Marland KitchenMarland KitchenMarland Kitchen 1  weekly for 12 weeks  Other Orders: TLB-BMP (Basic Metabolic Panel-BMET) (80048-METABOL) TLB-CBC Platelet - w/Differential (85025-CBCD)  Patient Instructions: 1)  Doing fine exceopt for GERD not responding to Nexium two times a day Also gets choked with certain foods 2)  To schedule appt for endoscopy by DFr. Kaplan 3)  Will call lab results 4)  refilled medications 5)  UTI has greatly improved and has no other medication is needed Prescriptions: VITAMIN D (ERGOCALCIFEROL) 50000 UNIT CAPS (ERGOCALCIFEROL) 1 weekly for 12 weeks  #12 x 1   Entered and Authorized by:   Judithann Sheen MD   Signed by:   Judithann Sheen MD on 01/20/2009   Method used:   Electronically to        [id number]* (retail)       [address]       [place], [place]  [address]       Ph: [phone]       Fax: [phone]   RxID:   [id number] ALPRAZOLAM 0.25 MG  TABS (ALPRAZOLAM) 1  by mouth three times a day  #90 x 5   Entered and Authorized by:   Judithann Sheen MD   Signed by:   Judithann Sheen MD on 01/19/2009   Method used:   Print then Give to Patient   RxID:   [id number] NEXIUM 40 MG  CPDR (ESOMEPRAZOLE MAGNESIUM) 1 cap two times a day for gerd  #60 x 11   Entered and Authorized by:   Loni Dolly  Alphonzo Severance MD   Signed by:   Judithann Sheen MD on 01/19/2009   Method used:   Electronically to        [facility] [phone]* (retail)       [address]       [place], [place]  [address]       Ph: [phone]       Fax: [phone]   RxID:   [id number] WARFARIN SODIUM 2.5 MG TABS (WARFARIN SODIUM) Use as directed by Anticoagualtion Clinic  #34 x 11   Entered and Authorized by:   Judithann Sheen MD   Signed by:   Judithann Sheen MD on 01/19/2009   Method used:   Electronically to        [facility] [phone]* (retail)       [address]       [place], [place]   [address]       Ph: [phone]       Fax: [phone]   RxID:   [id number] LIPITOR 20 MG  TABS (ATORVASTATIN CALCIUM) once daily  #30 x 11   Entered and Authorized by:   Judithann Sheen MD   Signed by:   Judithann Sheen MD on 01/19/2009   Method used:   Electronically to        [facility] [phone]* (retail)       [address]       [place], [place]  [address]       Ph: [phone]       Fax: [phone]   RxID:   [id number] ALTACE 10 MG  CAPS (RAMIPRIL) once daily  #30 x 11   Entered and Authorized by:   Judithann Sheen MD   Signed by:   Judithann Sheen MD on 01/19/2009   Method used:   Electronically to        [facility] [phone]* (retail)       [address]       [place], [place]  [address]       Ph: [phone]       Fax: [phone]   RxID:   [id number] HYDROCHLOROTHIAZIDE 25 MG  TABS (HYDROCHLOROTHIAZIDE) once daily  #30 x 11   Entered and Authorized by:   Judithann Sheen MD   Signed by:   Judithann Sheen MD on 01/19/2009   Method used:   Electronically to        [id number]* (retail)       [address]       [place], [place]  [address]       Ph: [phone]       Fax: [phone]   RxID:   [id number] METOPROLOL TARTRATE 25 MG  TABS (METOPROLOL TARTRATE) 1by mouth two times a day  #60 x 11   Entered and Authorized by:   Judithann Sheen MD   Signed by:   Judithann Sheen MD on 01/19/2009   Method used:   Electronically to        [facility] [phone]* (retail)       [address]  [place], [place]  [address]       Ph: [phone]       Fax: [phone]   RxID:   [id number]   Preventive Care Screening  Colonoscopy:    Date:  03/21/2006    Results:  abnormal   Last Pneumovax:    Date:  02/20/2006    Results:  Pneumovax     Immunization History:  Pneumovax Immunization  History:    Pneumovax:  pneumovax (02/20/2006)    Laboratory Results   Urine Tests    Routine Urinalysis   Color: yellow Appearance: Clear Glucose: negative   (Normal Range: Negative) Bilirubin: negative   (Normal Range: Negative) Ketone: negative   (Normal Range: Negative) Spec. Gravity: 1.015   (Normal Range: 1.003-1.035) Blood: trace-intact   (Normal Range: Negative) pH: 5.5   (Normal Range: 5.0-8.0) Protein: negative   (Normal Range: Negative) Urobilinogen: 0.2   (Normal Range: 0-1) Nitrite: negative   (Normal Range: Negative) Leukocyte Esterace: trace   (Normal Range: Negative)    Comments: Rita Ohara  January 19, 2009 1:40 PM

## 2010-02-02 NOTE — Assessment & Plan Note (Signed)
Summary: NEXIUM NOT WORKING ANYMORE/YF    History of Present Illness Visit Type: Initial Visit Primary GI MD: [name] Heaps MD [facility] Primary Provider: Dianna Limbo, MD Chief Complaint: Patient c/o dysphagia to solids as well as increased reflux at night while lying down. History of Present Illness:   [name] [name] is an 77 year old white [name] referred at the request of Dr. Scotty [name] for evaluation of dysphagia.  She has a history of esophageal stricture which was last dilated and in 2008.  She is complaining of increasing dysphagia to solids.  She denies pyrosis.  She takes Nexium daily.  The patient is on Coumadin because of atrial fibrillation.   GI Review of Systems    Reports acid reflux, belching, dysphagia with liquids, and  dysphagia with solids.       Reports diverticulosis.     Denies anal fissure, black tarry stools, change in bowel habit, constipation, diarrhea, fecal incontinence, heme positive stool, hemorrhoids, irritable bowel syndrome, jaundice, light color stool, liver problems, rectal bleeding, and  rectal pain. Preventive Screening-Counseling & Management  Alcohol-Tobacco     Smoking Status: never      Drug Use:  no.      Current Medications (verified): 1)  Metoprolol Tartrate 25 Mg  Tabs (Metoprolol Tartrate) .Marland Kitchen.. 1by Mouth Two Times A Day 2)  Alprazolam 0.25 Mg  Tabs (Alprazolam) .Marland Kitchen.. 1  By Mouth Three Times A Day 3)  Hydrochlorothiazide 25 Mg  Tabs (Hydrochlorothiazide) .... Once Daily 4)  Altace 10 Mg  Caps (Ramipril) .... Once Daily 5)  Lipitor 20 Mg  Tabs (Atorvastatin Calcium) .... Once Daily 6)  Warfarin Sodium 2.5 Mg Tabs (Warfarin Sodium) .... Use As Directed By Anticoagualtion Clinic 7)  Nexium 40 Mg  Cpdr (Esomeprazole Magnesium) .Marland Kitchen.. 1 Cap Two Times A Day For Gerd 8)  Vitamin D (Ergocalciferol) 50000 Unit Caps (Ergocalciferol) .Marland Kitchen.. 1 Weekly For 12 Weeks  Allergies (verified): 1)  ! Sulfa 2)  ! * Enablex  Past History:  Past Medical  History: Reviewed history from 02/11/2009 and no changes required. 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 PAROXYSMAL ATRIAL FIBRILLATION (ICD-427.31) MITRAL REGURGITATION, MILD (ICD-396.3) MITRAL VALVE PROLAPSE, HX OF (ICD-V12.50) HYPERTENSION (ICD-401.9) HYPERLIPIDEMIA (ICD-272.4) VITAMIN D DEFICIENCY (ICD-268.9) PNEUMONIA (ICD-486) TRACHEITIS (ICD-464.10) COUGH (ICD-786.2) UNSTEADY GAIT (ICD-781.2) DIARRHEA (ICD-787.91) IRON DEFICIENCY ANEMIA SECONDARY TO BLOOD LOSS (ICD-280.0) PNEUMONIA, ORGANISM UNSPECIFIED (ICD-486) MELENA (ICD-578.1) IBS (ICD-564.1) INCONTINENCE (ICD-788.30) CONSTIPATION (ICD-564.00) GERD (ICD-530.81) OSTEOPENIA (ICD-733.90) UTI (ICD-599.0) HYPOTHYROIDISM (ICD-244.9) UNS ADVRS EFF OTH RX MEDICINAL&BIOLOGICAL SBSTNC (ICD-995.29) COLONIC POLYPS, HX OF (ICD-V12.72) PAP 2009 MAMMOGRAM 2009 EYE EXAM 2009 EKG per Dr. Daleen Squibb DEXA-BONE DENSITY Appt 02/03/08 PNEUMONIA VACCINE 2008 SHINGLES VACCINE refuses DT Colon scopy Dr Arlyce Dice 2008 SMOKER never  Diverticulosis  Past Surgical History: Hysterectomy left breast bx cardiac ablation  Family History: Mother died of diabetes and CVA at age 82.  Father died  of complications of COPD at age 18.  She had a __________ at 51.  He had  COPD, but she is not sure why he died. No FH of Colon Cancer: Family History of Diabetes: Mother Family History of Heart Disease: Maternal Aunts and Uncles  Social History:  She lives in [place] with her daughter.    She is active but does not routine  exercise. Occupation: Retired Patient has never smoked.  Alcohol Use - no Illicit Drug Use - no Drug Use:  no  Review of Systems       The patient complains of headaches-new,  urine leakage, vision changes, and voice change.  The patient denies allergy/sinus, anemia, anxiety-new, arthritis/joint pain, back pain, blood in urine, breast changes/lumps, change in vision, confusion,  cough, coughing up blood, depression-new, fainting, fatigue, fever, hearing problems, heart murmur, heart rhythm changes, itching, menstrual pain, muscle pains/cramps, night sweats, nosebleeds, pregnancy symptoms, shortness of breath, skin rash, sleeping problems, sore throat, swelling of feet/legs, swollen lymph glands, thirst - excessive , urination - excessive , and urination changes/pain.         All other systems were reviewed and were negative   Vital Signs:  Patient profile:   77 year old [name] Height:      60 inches Weight:      133 pounds BMI:     26.07 BSA:     1.57 Pulse rate:   68 / minute Pulse rhythm:   regular BP sitting:   162 / 70  (right arm)  Vitals Entered By: [name] Ramal CMA Duncan [name]) (February 18, 2009 1:55 PM)  Physical Exam  Additional Exam:  She is an [name] in no acute distress  skin: anicteric HEENT: normocephalic; PEERLA; no nasal or pharyngeal abnormalities neck: supple nodes: no cervical lymphadenopathy chest: clear to ausculatation and percussion heart: no murmurs, gallops, or rubs abd: soft, nontender; BS normoactive; no abdominal masses, tenderness, organomegaly rectal: deferred ext: no cynanosis, clubbing, edema skeletal: no deformities neuro: oriented x 3; no focal abnormalities    Impression & Recommendations:  Problem # 1:  ESOPHAGEAL STRICTURE (ICD-530.3) Assessment Deteriorated  Dysphagia is undoubtedly related to a recurrent esophageal stricture.  Recommendations #1 upper endoscopy with dilatation #2 Coumadin will be held in advance of the procedure  Orders: ZEGD Balloon Dil (ZEGD Balloon)  Problem # 2:  COUMADIN THERAPY (ICD-V58.61) Assessment: Comment Only  Problem # 3:  PAROXYSMAL ATRIAL FIBRILLATION (ICD-427.31) Assessment: Comment Only  Patient Instructions: 1)  Conscious Sedation brochure given.  2)  Upper Endoscopy with Dilatation brochure given.  3)  Your EGD/balloon is scheduled for 03/04/2009 at [facility]  Endo 4)  The medication list was reviewed and reconciled.  All changed / newly prescribed medications were explained.  A complete medication list was provided to the patient / caregiver.

## 2010-02-02 NOTE — Medication Information (Signed)
Summary: rov/eac  Anticoagulant Therapy  Managed by: Weston Brass, PharmD Referring MD: Valera Castle MD PCP: Dianna Limbo, MD Supervising MD: Clifton James MD, Cristal Deer Indication 1: Atrial Fibrillation (ICD-427.31) Lab Used: [facility] Site: [facility] INR POC 1.6 INR RANGE 2 - 3  Dietary changes: no    Health status changes: yes       Details: had endoscopy last week.  Restarted Coumadin on 6/3 after holding for 4 days.   Bleeding/hemorrhagic complications: no    Recent/future hospitalizations: no    Any changes in medication regimen? no    Recent/future dental: no  Any missed doses?: yes     Details: held x 4 days last week prior to endoscopy  Is patient compliant with meds? yes      Comments: Pt will be out of town until 6/28.  Will recheck INR once back in town  Allergies: 1)  ! Sulfa 2)  ! Enablex (Darifenacin Hydrobromide)  Anticoagulation Management History:      The patient is taking warfarin and comes in today for a routine follow up visit.  Positive risk factors for bleeding include an age of 19 years or older and history of GI bleeding.  The bleeding index is 'intermediate risk'.  Positive CHADS2 values include History of HTN and Age > 73 years old.  The start date was 01/03/2006.  Her last INR was 2.5.  Anticoagulation responsible provider: Clifton James MD, Cristal Deer.  INR POC: 1.6.  Exp: 08/2010.    Anticoagulation Management Assessment/Plan:      The patient's current anticoagulation dose is Warfarin sodium 2.5 mg tabs: Use as directed by Anticoagualtion Clinic.  The target INR is 2 - 3.  The next INR is due 06/29/2009.  Anticoagulation instructions were given to patient.  Results were reviewed/authorized by Weston Brass, PharmD.  She was notified by Weston Brass PharmD.         Prior Anticoagulation Instructions: INR 2.6  Continue taking 1/2 tablet on Monday, and 1 tablet all other days.   Hold coumadin beginning 05/29/09.   Resume coumadin the evening of your  procedure.   On 06/03/09 - Take 1.5 tablets On 06/04/09 - Take 1.5 tablets Then return to normal dosing. Return to clinic 1 week after procedure on Friday 06/10/09.  Current Anticoagulation Instructions: INR 1.6  Take extra 1/2 tablet today, 1 1/2 tablets tomorrow then resume same dose of 1 tablet every day except 1/2 tablet on Monday.

## 2010-02-02 NOTE — Progress Notes (Signed)
 ----   Converted from flag ---- ---- 02/28/2009 1:47 PM, Louis Meckel MD wrote: yes  ---- 02/28/2009 12:28 PM, Merri Ray CMA (AAMA) wrote: [name] [name] pt to hold her coumadin today. Her procedure is Friday, She stated she already took her coumadin for today. Is her procedure OK for Friday still? ------------------------------

## 2010-02-02 NOTE — Letter (Signed)
Summary: EGD Instructions  [facility]  [address] [place], [place] [address]   Phone: [phone]  Fax: [phone]       [name] [name]    October 27, 1924    MRN: [id number]       Procedure Day /Date:FRIDAY 06/03/2009     Arrival Time:9:15AM     Procedure Time:10:15AM     Location of Procedure:                     X  [facility] ( Outpatient Registration)    PREPARATION FOR ENDOSCOPY/BOTOX   On 06/03/2009 THE DAY OF THE PROCEDURE:  1.   No solid foods, milk or milk products are allowed after midnight the night before your procedure.  2.   Do not drink anything colored red or purple.  Avoid juices with pulp.  No orange juice.  3.  You may drink clear liquids until6:15AM, which is 4 hours before your procedure.                                                                                                CLEAR LIQUIDS INCLUDE: Water Jello Ice Popsicles Tea (sugar ok, no milk/cream) Powdered fruit flavored drinks Coffee (sugar ok, no milk/cream) Gatorade Juice: apple, white grape, white cranberry  Lemonade Clear bullion, consomm, broth Carbonated beverages (any kind) Strained chicken noodle soup Hard Candy   MEDICATION INSTRUCTIONS  Unless otherwise instructed, you should take regular prescription medications with a small sip of water as early as possible the morning of your procedure.   Stop taking Coumadin on  _  (5 days before procedure).05/29/2009  Additional medication instructions: You will be contaced by our office prior to your procedure for directions on holding your Coumadin/Warfarin.  If you do not hear from our office 1 week prior to your scheduled procedure, please call [phone] to discuss.              OTHER INSTRUCTIONS  You will need a responsible adult at least 75 years of age to accompany you and drive you home.   This person must remain in the waiting room during your procedure.  Wear loose fitting clothing that  is easily removed.  Leave jewelry and other valuables at home.  However, you may wish to bring a book to read or an iPod/MP3 player to listen to music as you wait for your procedure to start.  Remove all body piercing jewelry and leave at home.  Total time from sign-in until discharge is approximately 2-3 hours.  You should go home directly after your procedure and rest.  You can resume normal activities the day after your procedure.  The day of your procedure you should not:   Drive   Make legal decisions   Operate machinery   Drink alcohol   Return to work  You will receive specific instructions about eating, activities and medications before you leave.    The above instructions have been reviewed and explained to me by   _______________________    I fully understand and can verbalize these instructions  _____________________________ Date _________

## 2010-02-02 NOTE — Medication Information (Signed)
Summary: rov/tp   Anticoagulant Therapy  Managed by: Geoffry Paradise, PharmD Referring MD: Valera Castle MD PCP: Dianna Limbo, MD Supervising MD: Johney Frame MD, Fayrene Fearing Indication 1: Atrial Fibrillation (ICD-427.31) Lab Used: [facility] Site: [facility] INR POC 2.1 INR RANGE 2 - 3  Dietary changes: no    Health status changes: no    Bleeding/hemorrhagic complications: no    Recent/future hospitalizations: no    Any changes in medication regimen? no    Recent/future dental: no  Any missed doses?: no       Is patient compliant with meds? yes       Allergies: 1)  ! Sulfa 2)  ! Enablex (Darifenacin Hydrobromide)  Anticoagulation Management History:      Positive risk factors for bleeding include an age of 30 years or older and history of GI bleeding.  The bleeding index is 'intermediate risk'.  Positive CHADS2 values include History of HTN and Age > 51 years old.  The start date was 01/03/2006.  Her last INR was 2.5.  Anticoagulation responsible provider: Shabree Tebbetts MD, Fayrene Fearing.  INR POC: 2.1.  Exp: 12/2010.    Anticoagulation Management Assessment/Plan:      The patient's current anticoagulation dose is Warfarin sodium 2.5 mg tabs: Use as directed by Anticoagualtion Clinic.  The target INR is 2 - 3.  The next INR is due 01/06/2010.  Anticoagulation instructions were given to patient.  Results were reviewed/authorized by Geoffry Paradise, PharmD.         Prior Anticoagulation Instructions: INR 1.6  Take an extra 1/2 tablet today.  Then return to normal dosing schedule of 1/2 tablet on Monday and 1 tablet all other days.  Return to clinic in 3 weeks.    Current Anticoagulation Instructions: INR:  2.1  Your INR is at goal today.  Please continue to take 1 tablet everday except for Monday, when you take 1/2 a tablet.  Please return to clinic in 4 weeks for another INR check.

## 2010-02-02 NOTE — Medication Information (Signed)
Summary: rov/tm  Anticoagulant Therapy  Managed by: [name] Dubin, PharmD, BCPS, CPP Referring MD: Valera Castle MD PCP: Dr. Jeanmarie Hubert MD: Gala Romney MD, Reuel Boom Indication 1: Atrial Fibrillation (ICD-427.31) Lab Used: [facility] Site: [facility] INR POC 2.1 INR RANGE 2 - 3  Dietary changes: no    Health status changes: no    Bleeding/hemorrhagic complications: no    Recent/future hospitalizations: no    Any changes in medication regimen? yes       Details: took cefuroxime 500mg  twice daily for UTI but stop after 3rd day (total of 5 pills)  Recent/future dental: no  Any missed doses?: no       Is patient compliant with meds? yes       Allergies (verified): 1)  ! Sulfa  Anticoagulation Management History:      The patient is taking warfarin and comes in today for a routine follow up visit.  Positive risk factors for bleeding include an age of 75 years or older and history of GI bleeding.  The bleeding index is 'intermediate risk'.  Positive CHADS2 values include History of HTN and Age > 41 years old.  The start date was 01/03/2006.  Her last INR was 2.5.  Anticoagulation responsible provider: Cledis Sohn MD, Reuel Boom.  INR POC: 2.1.  Cuvette Lot#: [id number].  Exp: 04/2010.    Anticoagulation Management Assessment/Plan:      The patient's current anticoagulation dose is Warfarin sodium 2.5 mg tabs: Use as directed by Anticoagualtion Clinic.  The target INR is 2 - 3.  The next INR is due 02/11/2009.  Anticoagulation instructions were given to patient.  Results were reviewed/authorized by [name] Dubin, PharmD, BCPS, CPP.  She was notified by Ysidro Evert, Pharm D Candidate.         Prior Anticoagulation Instructions: INR 2.1 Continue 1 pill everyday except 1/2 pill on Mondays. Recheck in 4 weeks.   Current Anticoagulation Instructions: INR: 2.1 Continue with same dosage of 2.5mg  daily except 1.25mg  on Mondays Recheck in 4 weeks

## 2010-02-02 NOTE — Medication Information (Signed)
Summary: rov/jm  Anticoagulant Therapy  Managed by: Cloyde Reams, RN, BSN Referring MD: Valera Castle MD PCP: Dianna Limbo, MD Supervising MD: Tenny Craw MD, Gunnar Fusi Indication 1: Atrial Fibrillation (ICD-427.31) Lab Used: [facility] Site: [facility] INR POC 2.4 INR RANGE 2 - 3  Dietary changes: no    Health status changes: no    Bleeding/hemorrhagic complications: no    Recent/future hospitalizations: no    Any changes in medication regimen? no    Recent/future dental: no  Any missed doses?: no       Is patient compliant with meds? yes       Allergies: 1)  ! Sulfa 2)  ! Enablex (Darifenacin Hydrobromide)  Anticoagulation Management History:      The patient is taking warfarin and comes in today for a routine follow up visit.  Positive risk factors for bleeding include an age of 75 years or older and history of GI bleeding.  The bleeding index is 'intermediate risk'.  Positive CHADS2 values include History of HTN and Age > 59 years old.  The start date was 01/03/2006.  Her last INR was 2.5.  Anticoagulation responsible Nahiem Dredge: Tenny Craw MD, Gunnar Fusi.  INR POC: 2.4.  Cuvette Lot#: [id number].  Exp: 12/2010.    Anticoagulation Management Assessment/Plan:      The patient's current anticoagulation dose is Warfarin sodium 2.5 mg tabs: Use as directed by Anticoagualtion Clinic.  The target INR is 2 - 3.  The next INR is due 11/18/2009.  Anticoagulation instructions were given to patient.  Results were reviewed/authorized by Cloyde Reams, RN, BSN.  She was notified by Cloyde Reams RN.         Prior Anticoagulation Instructions: INR 2.4  Continue taking one tablet every day except for one-half tablet on Monday.  We will see you in four weeks.    Current Anticoagulation Instructions: INR 2.4  Continue on same dosage 1 tablet daily except 1/2 tablet on Mondays.  Recheck in 4 weeks.

## 2010-02-02 NOTE — Medication Information (Signed)
Summary: rov/tm  Anticoagulant Therapy  Managed by: [name] Dubin, PharmD, BCPS, CPP Referring MD: Valera Castle MD PCP: Dr. Jeanmarie Hubert MD: Tenny Craw MD, Gunnar Fusi Indication 1: Atrial Fibrillation (ICD-427.31) Lab Used: [facility] Site: [facility] INR POC 2.1 INR RANGE 2 - 3  Dietary changes: no    Health status changes: no    Bleeding/hemorrhagic complications: no    Recent/future hospitalizations: no    Any changes in medication regimen? no    Recent/future dental: no  Any missed doses?: no       Is patient compliant with meds? yes       Allergies (verified): 1)  ! Sulfa 2)  ! * Enablex  Anticoagulation Management History:      The patient is taking warfarin and comes in today for a routine follow up visit.  Positive risk factors for bleeding include an age of 75 years or older and history of GI bleeding.  The bleeding index is 'intermediate risk'.  Positive CHADS2 values include History of HTN and Age > 71 years old.  The start date was 01/03/2006.  Her last INR was 2.5.  Anticoagulation responsible provider: Tenny Craw MD, Gunnar Fusi.  INR POC: 2.1.  Cuvette Lot#: [id number].  Exp: 04/2010.    Anticoagulation Management Assessment/Plan:      The patient's current anticoagulation dose is Warfarin sodium 2.5 mg tabs: Use as directed by Anticoagualtion Clinic.  The target INR is 2 - 3.  The next INR is due 03/11/2009.  Anticoagulation instructions were given to patient.  Results were reviewed/authorized by [name] Dubin, PharmD, BCPS, CPP.  She was notified by [name] Dubin PharmD, BCPS, CPP.         Prior Anticoagulation Instructions: INR: 2.1 Continue with same dosage of 2.5mg  daily except 1.25mg  on Mondays Recheck in 4 weeks  Current Anticoagulation Instructions: INR 2.1  Continue 0.5 tab each Monday and 1 tab on all other days.  Recheck in 4 weeks.

## 2010-02-02 NOTE — Letter (Signed)
Summary: Anticoagulation Modification Letter (OK'd To Hold)  [facility]  [address] [place], [place] [address]   Phone: [phone]  Fax: [phone]    May 18, 2009  Re:    [name] [name] DOB:    1924/10/22 MRN:    [id number]    Dear Annice Pih  We have scheduled the above patient for an endoscopic procedure. Our records show that  he/she is on anticoagulation therapy. Please advise as to how long the patient may come off their therapy of coumadin  prior to the scheduled procedure(s) on 06/03/2009   Please fax back/or route the completed form to Robin at [phone]  Thank you for your help with this matter.  Sincerely,  Merri Ray CMA Duncan Dull)   Physician Recommendation:  Hold Plavix 7 days prior ________________  Hold Coumadin 5 days prior ____________  Other ______________________________     Appended Document: Anticoagulation Modification Letter she can stop Coumadin.restart ASAP.  Appended Document: Anticoagulation Modification Letter (OK'd To Hold) Called pt to inform

## 2010-02-02 NOTE — Assessment & Plan Note (Signed)
Summary: FOLLOW-UP FROM ENDO.            [name] [name]    History of Present Illness Visit Type: Follow-up Visit Primary GI MD: [name] Heaps MD [facility] Primary Provider: Dianna Limbo, MD Chief Complaint: F/U Endo, no problems History of Present Illness:   [name] [name] has returned for followup following Botox injection of her LES.  She has what appears to be achalasia.  She reports an excellent response to the injections.  Dysphagia is significantly improved.   GI Review of Systems      Denies abdominal pain, acid reflux, belching, bloating, chest pain, dysphagia with liquids, dysphagia with solids, heartburn, loss of appetite, nausea, vomiting, vomiting blood, weight loss, and  weight gain.        Denies anal fissure, black tarry stools, change in bowel habit, constipation, diarrhea, diverticulosis, fecal incontinence, heme positive stool, hemorrhoids, irritable bowel syndrome, jaundice, light color stool, liver problems, rectal bleeding, and  rectal pain.    Current Medications (verified): 1)  Metoprolol Tartrate 25 Mg  Tabs (Metoprolol Tartrate) .Marland Kitchen.. 1by Mouth Two Times A Day 2)  Alprazolam 0.25 Mg  Tabs (Alprazolam) .Marland Kitchen.. 1  By Mouth Three Times A Day 3)  Hydrochlorothiazide 25 Mg  Tabs (Hydrochlorothiazide) .... Once Daily 4)  Altace 10 Mg  Caps (Ramipril) .... Once Daily 5)  Lipitor 20 Mg  Tabs (Atorvastatin Calcium) .... Once Daily 6)  Warfarin Sodium 2.5 Mg Tabs (Warfarin Sodium) .... Use As Directed By Anticoagualtion Clinic 7)  Nexium 40 Mg  Cpdr (Esomeprazole Magnesium) .Marland Kitchen.. 1 Cap Two Times A Day For Gerd 8)  Vitamin D (Ergocalciferol) 50000 Unit Caps (Ergocalciferol) .Marland Kitchen.. 1 Weekly For 12 Weeks 9)  Estrace 2 Mg Tabs (Estradiol) .Marland Kitchen.. 1 By Mouth Once Daily  Allergies (verified): 1)  ! Sulfa 2)  ! Enablex (Darifenacin Hydrobromide)  Past History:  Past Medical History: Reviewed history from 02/11/2009 and no changes required. 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 PAROXYSMAL ATRIAL FIBRILLATION (ICD-427.31) MITRAL REGURGITATION, MILD (ICD-396.3) MITRAL VALVE PROLAPSE, HX OF (ICD-V12.50) HYPERTENSION (ICD-401.9) HYPERLIPIDEMIA (ICD-272.4) VITAMIN D DEFICIENCY (ICD-268.9) PNEUMONIA (ICD-486) TRACHEITIS (ICD-464.10) COUGH (ICD-786.2) UNSTEADY GAIT (ICD-781.2) DIARRHEA (ICD-787.91) IRON DEFICIENCY ANEMIA SECONDARY TO BLOOD LOSS (ICD-280.0) PNEUMONIA, ORGANISM UNSPECIFIED (ICD-486) MELENA (ICD-578.1) IBS (ICD-564.1) INCONTINENCE (ICD-788.30) CONSTIPATION (ICD-564.00) GERD (ICD-530.81) OSTEOPENIA (ICD-733.90) UTI (ICD-599.0) HYPOTHYROIDISM (ICD-244.9) UNS ADVRS EFF OTH RX MEDICINAL&BIOLOGICAL SBSTNC (ICD-995.29) COLONIC POLYPS, HX OF (ICD-V12.72) PAP 2009 MAMMOGRAM 2009 EYE EXAM 2009 EKG per Dr. Daleen Squibb DEXA-BONE DENSITY Appt 02/03/08 PNEUMONIA VACCINE 2008 SHINGLES VACCINE refuses DT Colon scopy Dr Arlyce Dice 2008 SMOKER never  Diverticulosis  Past Surgical History: Reviewed history from 02/18/2009 and no changes required. Hysterectomy left breast bx cardiac ablation  Family History: Reviewed history from 02/18/2009 and no changes required. Mother died of diabetes and CVA at age 47.  Father died  of complications of COPD at age 36.  She had a __________ at 73.  He had  COPD, but she is not sure why he died. No FH of Colon Cancer: Family History of Diabetes: Mother Family History of Heart Disease: Maternal Aunts and Uncles  Social History: Reviewed history from 02/18/2009 and no changes required.  She lives in [place] with her daughter.    She is active but does not routine  exercise. Occupation: Retired Patient has never smoked.  Alcohol Use - no Illicit Drug Use - no  Review of Systems       The patient complains of arthritis/joint pain, change  in vision, muscle pains/cramps, and urination - excessive.  The patient denies allergy/sinus, anemia, anxiety-new, back pain, blood in urine,  breast changes/lumps, confusion, cough, coughing up blood, depression-new, fainting, fatigue, fever, headaches-new, hearing problems, heart murmur, heart rhythm changes, itching, menstrual pain, night sweats, nosebleeds, pregnancy symptoms, shortness of breath, skin rash, sleeping problems, sore throat, swelling of feet/legs, swollen lymph glands, thirst - excessive, urination changes/pain, urine leakage, vision changes, and voice change.    Vital Signs:  Patient profile:   77 year old female Height:      60 inches Weight:      132.25 pounds BMI:     25.92 Pulse rate:   60 / minute Pulse rhythm:   regular BP sitting:   130 / 60  (left arm) Cuff size:   regular  Vitals Entered By: [name] [name] CMA Duncan Dull) (July 08, 2009 10:38 AM)   Impression & Recommendations:  Problem # 1:  ACHALASIA (ICD-530.0) Assessment Improved Plan repeat Botox injection p.r.n.  Problem # 2:  COUMADIN THERAPY (ICD-V58.61) Coumadin will be held in advance of elective Botox injection  Patient Instructions: 1)  Copy sent to : [name] Limbo, MD 2)  The medication list was reviewed and reconciled.  All changed / newly prescribed medications were explained.  A complete medication list was provided to the patient / caregiver.  Appended Document: FOLLOW-UP FROM ENDO.            [facility] reviewed

## 2010-02-02 NOTE — Medication Information (Signed)
Summary: [name] [name]  Anticoagulant Therapy  Managed by: Cloyde Reams, RN, BSN Referring MD: Valera Castle MD PCP: Dianna Limbo, MD Supervising MD: Antoine Poche MD, Fayrene Fearing Indication 1: Atrial Fibrillation (ICD-427.31) Lab Used: [facility]  Site: [facility] INR POC 2.0 INR RANGE 2 - 3  Dietary changes: no    Health status changes: no    Bleeding/hemorrhagic complications: no    Recent/future hospitalizations: no    Any changes in medication regimen? no    Recent/future dental: no  Any missed doses?: no       Is patient compliant with meds? yes       Allergies: 1)  ! Sulfa 2)  ! Enablex (Darifenacin Hydrobromide)  Anticoagulation Management History:      The patient is taking warfarin and comes in today for a routine follow up visit.  Positive risk factors for bleeding include an age of 75 years or older and history of GI bleeding.  The bleeding index is 'intermediate risk'.  Positive CHADS2 values include History of HTN and Age > 35 years old.  The start date was 01/03/2006.  Her last INR was 2.5.  Anticoagulation responsible Jaedan Huttner: Antoine Poche MD, Fayrene Fearing.  INR POC: 2.0.  Cuvette Lot#: [id number].  Exp: 10/2010.    Anticoagulation Management Assessment/Plan:      The patient's current anticoagulation dose is Warfarin sodium 2.5 mg tabs: Use as directed by Anticoagualtion Clinic.  The target INR is 2 - 3.  The next INR is due 09/23/2009.  Anticoagulation instructions were given to patient.  Results were reviewed/authorized by Cloyde Reams, RN, BSN.  She was notified by Cloyde Reams RN.         Prior Anticoagulation Instructions: INR 2.4  Continue on same dosage 1 tablet daily except 1/2 tablet on Mondays.  Recheck in 4 weeks.    Current Anticoagulation Instructions: INR 2.0  Take 1.5 tablets today, then resume same dosage 1 tablet daily except 1/2 tablet on Mondays.  Recheck in 4 weeks.

## 2010-02-02 NOTE — Letter (Signed)
Summary: Handout Printed  Printed Handout:  - Coumadin Instructions-w/out Meds 

## 2010-02-02 NOTE — Medication Information (Signed)
Summary: rov/tm  Anticoagulant Therapy  Managed by: Cloyde Reams, RN, BSN Referring MD: Valera Castle MD PCP: Dianna Limbo, MD Supervising MD: Jens Som MD, Arlys John Indication 1: Atrial Fibrillation (ICD-427.31) Lab Used: [facility] Site: [facility] INR POC 2.4 INR RANGE 2 - 3  Dietary changes: no    Health status changes: no    Bleeding/hemorrhagic complications: no    Recent/future hospitalizations: no    Any changes in medication regimen? no    Recent/future dental: no  Any missed doses?: no       Is patient compliant with meds? yes       Allergies: 1)  ! Sulfa 2)  ! Enablex (Darifenacin Hydrobromide)  Anticoagulation Management History:      The patient is taking warfarin and comes in today for a routine follow up visit.  Positive risk factors for bleeding include an age of 52 years or older and history of GI bleeding.  The bleeding index is 'intermediate risk'.  Positive CHADS2 values include History of HTN and Age > 87 years old.  The start date was 01/03/2006.  Her last INR was 2.5.  Anticoagulation responsible provider: Jens Som MD, Arlys John.  INR POC: 2.4.  Cuvette Lot#: [id number].  Exp: 10/2010.    Anticoagulation Management Assessment/Plan:      The patient's current anticoagulation dose is Warfarin sodium 2.5 mg tabs: Use as directed by Anticoagualtion Clinic.  The target INR is 2 - 3.  The next INR is due 08/23/2009.  Anticoagulation instructions were given to patient.  Results were reviewed/authorized by Cloyde Reams, RN, BSN.  She was notified by Cloyde Reams RN.         Prior Anticoagulation Instructions: INR 2.5 Continue 1 pill everyday except 1/2 pill on Mondays. Recheck in 4 weeks.   Current Anticoagulation Instructions: INR 2.4  Continue on same dosage 1 tablet daily except 1/2 tablet on Mondays.  Recheck in 4 weeks.

## 2010-02-02 NOTE — Letter (Signed)
Summary: New Patient letter  [facility]  [address] [place], [place] [address]   Phone: [phone]  Fax: [phone]       01/20/2009 MRN: [id number]  [facility] [address] [place], [place]  [address]  Dear Ms. Murch,  Welcome to the Gastroenterology Division at [facility].    You are scheduled to see Dr.  Arlyce Dice on 02-18-09 at 2pm on the 3rd floor at [facility], [address]  We ask that you try to arrive at our office 15 minutes prior to your appointment time to allow for check-in.  We would like you to complete the enclosed self-administered evaluation form prior to your visit and bring it with you on the day of your appointment.  We will review it with you.  Also, please bring a complete list of all your medications or, if you prefer, bring the medication bottles and we will list them.  Please bring your insurance card so that we may make a copy of it.  If your insurance requires a referral to see a specialist, please bring your referral form from your primary care physician.  Co-payments are due at the time of your visit and may be paid by cash, check or credit card.     Your office visit will consist of a consult with your physician (includes a physical exam), any laboratory testing he/she may order, scheduling of any necessary diagnostic testing (e.g. x-ray, ultrasound, CT-scan), and scheduling of a procedure (e.g. Endoscopy, Colonoscopy) if required.  Please allow enough time on your schedule to allow for any/all of these possibilities.    If you cannot keep your appointment, please call [phone] to cancel or reschedule prior to your appointment date.  This allows [place] the opportunity to schedule an appointment for another patient in need of care.  If you do not cancel or reschedule by 5 p.m. the business day prior to your appointment date, you will be charged a $50.00 late cancellation/no-show fee.    Thank you for choosing [facility] for your medical needs.  We appreciate the opportunity to care for you.  Please visit [place] at our website  to learn more about our practice.                     Sincerely,                                                             The Gastroenterology Division

## 2010-02-02 NOTE — Medication Information (Signed)
Summary: rov/tm  Anticoagulant Therapy  Managed by: Eda Keys, PharmD Referring MD: Valera Castle MD PCP: Dianna Limbo, MD Supervising MD: Tenny Craw MD, Gunnar Fusi Indication 1: Atrial Fibrillation (ICD-427.31) Lab Used: [facility] Site: [facility] INR POC 2.6 INR RANGE 2 - 3  Dietary changes: no    Health status changes: no    Bleeding/hemorrhagic complications: no    Recent/future hospitalizations: no    Any changes in medication regimen? no    Recent/future dental: no  Any missed doses?: no       Is patient compliant with meds? yes      Comments: Pt to have botox injection in esophagus to help with swallowing.  Patient will have this procedure done on June 03, 2009, pt has been cleared by Dr. Daleen Squibb to hold coumadin beginning on 05/29/09 and resume coumadin the night of the procedure.   Allergies: 1)  ! Sulfa 2)  ! Enablex (Darifenacin Hydrobromide)  Anticoagulation Management History:      The patient is taking warfarin and comes in today for a routine follow up visit.  Positive risk factors for bleeding include an age of 75 years or older and history of GI bleeding.  The bleeding index is 'intermediate risk'.  Positive CHADS2 values include History of HTN and Age > 75 years old.  The start date was 01/03/2006.  Her last INR was 2.5.  Anticoagulation responsible provider: Tenny Craw MD, Gunnar Fusi.  INR POC: 2.6.  Cuvette Lot#: [id number].  Exp: 08/2010.    Anticoagulation Management Assessment/Plan:      The patient's current anticoagulation dose is Warfarin sodium 2.5 mg tabs: Use as directed by Anticoagualtion Clinic.  The target INR is 2 - 3.  The next INR is due 06/10/2009.  Anticoagulation instructions were given to patient.  Results were reviewed/authorized by Eda Keys, PharmD.  She was notified by Eda Keys.         Prior Anticoagulation Instructions: INR 2.4 Continue 1pill everyday except 1/2 pill on Mondays. Recheck in 4 weeks.   Current Anticoagulation  Instructions: INR 2.6  Continue taking 1/2 tablet on Monday, and 1 tablet all other days.   Hold coumadin beginning 05/29/09.   Resume coumadin the evening of your procedure.   On 06/03/09 - Take 1.5 tablets On 06/04/09 - Take 1.5 tablets Then return to normal dosing. Return to clinic 1 week after procedure on Friday 06/10/09.

## 2010-02-02 NOTE — Medication Information (Signed)
Summary: rov/ewj  Anticoagulant Therapy  Managed by: Weston Brass, PharmD Referring MD: Valera Castle MD PCP: Dianna Limbo, MD Supervising MD: Tenny Craw MD, Gunnar Fusi Indication 1: Atrial Fibrillation (ICD-427.31) Lab Used: [facility] Site: [facility] INR POC 2.4 INR RANGE 2 - 3  Dietary changes: no    Health status changes: no    Bleeding/hemorrhagic complications: no    Recent/future hospitalizations: no    Any changes in medication regimen? no    Recent/future dental: no  Any missed doses?: no       Is patient compliant with meds? yes       Allergies: 1)  ! Sulfa 2)  ! Enablex (Darifenacin Hydrobromide)  Anticoagulation Management History:      The patient is taking warfarin and comes in today for a routine follow up visit.  Positive risk factors for bleeding include an age of 75 years or older and history of GI bleeding.  The bleeding index is 'intermediate risk'.  Positive CHADS2 values include History of HTN and Age > 75 years old.  The start date was 01/03/2006.  Her last INR was 2.5.  Anticoagulation responsible provider: Tenny Craw MD, Gunnar Fusi.  INR POC: 2.4.  Cuvette Lot#: [id number].  Exp: 11/2010.    Anticoagulation Management Assessment/Plan:      The patient's current anticoagulation dose is Warfarin sodium 2.5 mg tabs: Use as directed by Anticoagualtion Clinic.  The target INR is 2 - 3.  The next INR is due 10/21/2009.  Anticoagulation instructions were given to patient.  Results were reviewed/authorized by Weston Brass, PharmD.  She was notified by Kennieth Francois.         Prior Anticoagulation Instructions: INR 2.0  Take 1.5 tablets today, then resume same dosage 1 tablet daily except 1/2 tablet on Mondays.  Recheck in 4 weeks.     Current Anticoagulation Instructions: INR 2.4  Continue taking one tablet every day except for one-half tablet on Monday.  We will see you in four weeks.

## 2010-02-02 NOTE — Progress Notes (Signed)
Summary: stop coumadin   Phone Note From Other Clinic   Summary of Call: GI ofc needs clearance to stop coumadin today for procedure. Call Robin @ [phone]  Initial call taken by: Edman Circle,  February 28, 2009 8:53 AM  Follow-up for Phone Call        Duncan Dull and all that Dr. Daleen Squibb said was reviewed. Did not say whether it is ok or not for pt to stop coumadin. The pt needs to stop it today. I will send this to Dr. Daleen Squibb in hopes that he will look at this today. She is aware that he is not back in the office until tomorrow.  Follow-up by: Duncan Dull, RN, BSN,  February 28, 2009 10:38 AM  Additional Follow-up for Phone Call Additional follow up Details #1::        She can hold Coumadin. Additional Follow-up by: Gaylord Shih, MD, [facility],  February 28, 2009 11:40 AM

## 2010-02-02 NOTE — Procedures (Signed)
Summary: Upper Endoscopy  Patient: [name] [name] Note: All result statuses are Final unless otherwise noted.  Tests: (1) Upper Endoscopy (EGD)   EGD Upper Endoscopy       DONE     [facility]     [address] [place], [place]  [address]           ENDOSCOPY PROCEDURE REPORT           PATIENT:  [name] [name], [name] [name]  MR#:  [id number]     BIRTHDATE:  1924/05/04, 85 yrs. old  GENDER:  female           ENDOSCOPIST:  Barbette Hair. Arlyce Dice, MD     Referred by:           PROCEDURE DATE:  06/03/2009     PROCEDURE:  EGD w/botox injection     ASA CLASS:  Class II     INDICATIONS:  Botox injection for achalasia           MEDICATIONS:   Fentanyl 40 mcg IV, Versed 3 mg IV, glycopyrrolate     (Robinal) 0.2 mg IV     TOPICAL ANESTHETIC:  Cetacaine Spray           DESCRIPTION OF PROCEDURE:   After the risks benefits and     alternatives of the procedure were thoroughly explained, informed     consent was obtained.  The  endoscope was introduced through the     mouth and advanced to the third portion of the duodenum, without     limitations.  The instrument was slowly withdrawn as the mucosa     was fully examined.           A diverticulum was found in the distal esophagus. Large     diverticulum distal esophagus  stenosis at the gastroesophageal     junction. Stenotic GE junction. Scope passes through with minimal     resistance. botox injection 25 units (1cc) was injected into each     quadrant at GE junction  Otherwise the examination was normal.     Retroflexed views revealed no abnormalities.    The scope was then     withdrawn from the patient and the procedure completed.           COMPLICATIONS:  None           ENDOSCOPIC IMPRESSION:     1) Diverticulum in the distal esophagus     2) Achalasia - s/p botox injection     3) Otherwise normal examination     RECOMMENDATIONS:     1) Call office next 2-3 days to schedule an office appointment     for 3 weeks           REPEAT  EXAM:  No           ______________________________     Barbette Hair. Arlyce Dice, MD           CC:  Dianna Limbo, MD           n.     Rosalie Doctor:   Barbette Hair. Kaplan at 06/03/2009 12:23 PM           [name] [name], [id number]  Note: An exclamation mark (!) indicates a result that was not dispersed into the flowsheet. Document Creation Date: 06/03/2009 12:24 PM _______________________________________________________________________  (1) Order result status: Final Collection or observation date-time: 06/03/2009 12:06 Requested date-time:  Receipt date-time:  Reported date-time:  Referring  Physician:   Ordering Physician: Melvia Heaps [phone]) Specimen Source:  Source: Launa Grill Order Number: [id number] Lab site:

## 2010-02-02 NOTE — Letter (Signed)
Summary: Appt Reminder 2  [facility]  [address] [place], [place] [address]   Phone: [phone]  Fax: [phone]        April 18, 2009 MRN: [id number]    [facility] [address] [place], [place]  [address]    Dear Ms. Simkins,   You have a return appointment with Dr.Robert Arlyce Dice on 05-18-09 at 11am. Please remember to bring a complete list of the medicines you are taking, your insurance card and your co-pay.  If you have to cancel or reschedule this appointment, please call before 5:00 pm the evening before to avoid a cancellation fee.  If you have any questions or concerns, please call [phone].    Sincerely,    Laureen Ochs LPN  Appended Document: Appt Reminder 2 Letter mailed to patient.

## 2010-02-02 NOTE — Medication Information (Signed)
Summary: rov/sp  Anticoagulant Therapy  Managed by: Bethena Midget, RN, BSN Referring MD: Valera Castle MD PCP: Dianna Limbo, MD Supervising MD: Riley Kill MD, Maisie Fus Indication 1: Atrial Fibrillation (ICD-427.31) Lab Used: [facility] Site: [facility] INR POC 2.5 INR RANGE 2 - 3  Dietary changes: no    Health status changes: no    Bleeding/hemorrhagic complications: no    Recent/future hospitalizations: no    Any changes in medication regimen? no    Recent/future dental: no  Any missed doses?: no       Is patient compliant with meds? yes       Allergies: 1)  ! Sulfa 2)  ! Enablex (Darifenacin Hydrobromide)  Anticoagulation Management History:      The patient is taking warfarin and comes in today for a routine follow up visit.  Positive risk factors for bleeding include an age of 75 years or older and history of GI bleeding.  The bleeding index is 'intermediate risk'.  Positive CHADS2 values include History of HTN and Age > 5 years old.  The start date was 01/03/2006.  Her last INR was 2.5.  Anticoagulation responsible provider: Riley Kill MD, Maisie Fus.  INR POC: 2.5.  Cuvette Lot#: [id number].  Exp: 09/2010.    Anticoagulation Management Assessment/Plan:      The patient's current anticoagulation dose is Warfarin sodium 2.5 mg tabs: Use as directed by Anticoagualtion Clinic.  The target INR is 2 - 3.  The next INR is due 07/27/2009.  Anticoagulation instructions were given to patient.  Results were reviewed/authorized by Bethena Midget, RN, BSN.  She was notified by Bethena Midget, RN, BSN.         Prior Anticoagulation Instructions: INR 1.6  Take extra 1/2 tablet today, 1 1/2 tablets tomorrow then resume same dose of 1 tablet every day except 1/2 tablet on Monday.   Current Anticoagulation Instructions: INR 2.5 Continue 1 pill everyday except 1/2 pill on Mondays. Recheck in 4 weeks.

## 2010-02-03 ENCOUNTER — Ambulatory Visit: Admit: 2010-02-03 | Payer: Self-pay

## 2010-02-03 ENCOUNTER — Encounter: Payer: Self-pay | Admitting: Cardiology

## 2010-02-03 ENCOUNTER — Encounter (INDEPENDENT_AMBULATORY_CARE_PROVIDER_SITE_OTHER): Payer: Medicare Other

## 2010-02-03 DIAGNOSIS — I4891 Unspecified atrial fibrillation: Secondary | ICD-10-CM

## 2010-02-03 DIAGNOSIS — Z7901 Long term (current) use of anticoagulants: Secondary | ICD-10-CM

## 2010-02-08 NOTE — Medication Information (Signed)
Summary: rov/ewj  Anticoagulant Therapy  Managed by: Bethena Midget, RN, BSN Referring MD: Valera Castle MD PCP: Dianna Limbo, MD Supervising MD: Jens Som MD, Arlys John Indication 1: Atrial Fibrillation (ICD-427.31) Lab Used: [facility] Site: [facility] INR POC 2.6 INR RANGE 2 - 3  Dietary changes: no    Health status changes: no    Bleeding/hemorrhagic complications: no    Recent/future hospitalizations: no    Any changes in medication regimen? no    Recent/future dental: no  Any missed doses?: no       Is patient compliant with meds? yes      Comments: Lt eye cataract removal on 02/27/10.   Allergies: 1)  ! Sulfa 2)  ! Enablex (Darifenacin Hydrobromide)  Anticoagulation Management History:      The patient is taking warfarin and comes in today for a routine follow up visit.  Positive risk factors for bleeding include an age of 75 years or older and history of GI bleeding.  The bleeding index is 'intermediate risk'.  Positive CHADS2 values include History of HTN and Age > 50 years old.  The start date was 01/03/2006.  Her last INR was 2.5.  Anticoagulation responsible provider: Jens Som MD, Arlys John.  INR POC: 2.6.  Cuvette Lot#: [id number].  Exp: 01/2011.    Anticoagulation Management Assessment/Plan:      The patient's current anticoagulation dose is Warfarin sodium 2.5 mg tabs: Use as directed by Anticoagualtion Clinic.  The target INR is 2 - 3.  The next INR is due 03/03/2010.  Anticoagulation instructions were given to patient.  Results were reviewed/authorized by Bethena Midget, RN, BSN.  She was notified by Bethena Midget, RN, BSN.         Prior Anticoagulation Instructions: INR 2.5 Continue 1 pill everyday except 1/2 pill on Mondays. Recheck in 4 weeks.   Current Anticoagulation Instructions: INR 2.6 Continue 1 pill everyday except 1/2 pill on Mondays. Recheck in 4 weeks.

## 2010-02-09 ENCOUNTER — Other Ambulatory Visit: Payer: Self-pay | Admitting: Family Medicine

## 2010-02-10 ENCOUNTER — Encounter: Payer: Self-pay | Admitting: Cardiology

## 2010-02-10 ENCOUNTER — Ambulatory Visit (INDEPENDENT_AMBULATORY_CARE_PROVIDER_SITE_OTHER): Payer: Medicare Other | Admitting: Cardiology

## 2010-02-10 DIAGNOSIS — I4891 Unspecified atrial fibrillation: Secondary | ICD-10-CM

## 2010-02-13 ENCOUNTER — Other Ambulatory Visit: Payer: Self-pay | Admitting: Family Medicine

## 2010-02-16 NOTE — Assessment & Plan Note (Signed)
Summary: f14m/dfg from 08-03-09/ep  Medications Added ESTRACE 2 MG TABS (ESTRADIOL) 1/2 tab every other day      Allergies Added:   Visit Type:  6 mo follow up Referring Provider:  n/a Primary Provider:  Dianna Limbo, MD  CC:  edema in ankles. feels tired at times. pt has no other complaints today.Marland Kitchen  History of Present Illness: Mrs Dalgleish returns today for E and M of her CAF, anticoagulation. Other than occasional dizziness with standing or prolonged sitting, no other complaints.Specifically no chest, palpitations, or sycope. On anticoagulatio but no bleeding reported. Hemoglobin normal 12/11.  Last Echo 01/2008 with mild MR.  Current Medications (verified): 1)  Metoprolol Tartrate 25 Mg  Tabs (Metoprolol Tartrate) .Marland Kitchen.. 1by Mouth Two Times A Day 2)  Alprazolam 0.25 Mg  Tabs (Alprazolam) .Marland Kitchen.. 1  By Mouth Three Times A Day 3)  Hydrochlorothiazide 25 Mg  Tabs (Hydrochlorothiazide) .... Once Daily 4)  Altace 10 Mg  Caps (Ramipril) .... Once Daily 5)  Lipitor 20 Mg  Tabs (Atorvastatin Calcium) .... Once Daily 6)  Warfarin Sodium 2.5 Mg Tabs (Warfarin Sodium) .... Use As Directed By Anticoagualtion Clinic 7)  Nexium 40 Mg  Cpdr (Esomeprazole Magnesium) .Marland Kitchen.. 1 Cap Two Times A Day For Gerd 8)  Estrace 2 Mg Tabs (Estradiol) .... 1/2 Tab Every Other Day  Allergies (verified): 1)  ! Sulfa 2)  ! 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 PAROXYSMAL ATRIAL FIBRILLATION (ICD-427.31) MITRAL REGURGITATION, MILD (ICD-396.3) MITRAL VALVE PROLAPSE, HX OF (ICD-V12.50) HYPERTENSION (ICD-401.9) HYPERLIPIDEMIA (ICD-272.4) VITAMIN D DEFICIENCY (ICD-268.9) PNEUMONIA (ICD-486) TRACHEITIS (ICD-464.10) COUGH (ICD-786.2) UNSTEADY GAIT (ICD-781.2) DIARRHEA (ICD-787.91) IRON DEFICIENCY ANEMIA SECONDARY TO BLOOD LOSS (ICD-280.0) PNEUMONIA, ORGANISM UNSPECIFIED  (ICD-486) MELENA (ICD-578.1) IBS (ICD-564.1) INCONTINENCE (ICD-788.30) CONSTIPATION (ICD-564.00) GERD (ICD-530.81) OSTEOPENIA (ICD-733.90) UTI (ICD-599.0) HYPOTHYROIDISM (ICD-244.9) UNS ADVRS EFF OTH RX MEDICINAL&BIOLOGICAL SBSTNC (ICD-995.29) COLONIC POLYPS, HX OF (ICD-V12.72) PAP 2009 MAMMOGRAM 2009 EYE EXAM 2009 EKG per Dr. Daleen Squibb DEXA-BONE DENSITY Appt 02/03/08 PNEUMONIA VACCINE 2008 SHINGLES VACCINE refuses DT Colon scopy Dr Arlyce Dice 2008 SMOKER never  Diverticulosis  Past Surgical History: Last updated: 2009-03-18 Hysterectomy left breast bx cardiac ablation  Family History: Last updated: 2009/03/18 Mother died of diabetes and CVA at age 32.  Father died  of complications of COPD at age 62.  She had a __________ at 58.  He had  COPD, but she is not sure why he died. No FH of Colon Cancer: Family History of Diabetes: Mother Family History of Heart Disease: Maternal Aunts and Uncles  Social History: Last updated: 03-18-2009  She lives in [place] with her daughter.    She is active but does not routine  exercise. Occupation: Retired Patient has never smoked.  Alcohol Use - no Illicit Drug Use - no  Risk Factors: Smoking Status: never (March 18, 2009)  Review of Systems       Negative other than HPI.  Vital Signs:  Patient profile:   77 year old female Height:      60 inches Weight:      128.75 pounds BMI:     25.24 Pulse rate:   59 / minute Resp:     18 per minute BP sitting:   136 / 68  (left arm) Cuff size:   large  Vitals Entered By: Celestia Khat, CMA (February 10, 2010 10:05 AM)  Physical Exam  General:  Well developed, well nourished, in no acute distress.  Head:  normocephalic and atraumatic Eyes:  PERRLA/EOM intact; conjunctiva and lids normal. Neck:  Neck supple, no JVD. No masses, thyromegaly or abnormal cervical nodes. Chest Sunya Humbarger:  no deformities or breast masses noted Lungs:  Clear bilaterally to auscultation and percussion. Heart:   IRR, variable S1 and S2, soft systolic murmur at the apex. Msk:  decreased ROM.   Pulses:  pulses normal in all 4 extremities Extremities:  trace left pedal edema and trace right pedal edema.   Neurologic:  Alert and oriented x 3. Skin:  Intact without lesions or rashes. Psych:  Normal affect.   Impression & Recommendations:  Problem # 1:  DIZZINESS (ICD-780.4) I have given ortostatic precautions.  Problem # 2:  COUMADIN THERAPY (ICD-V58.61) Assessment: Unchanged  Problem # 3:  ANEMIA (ICD-285.9) Assessment: Improved  Problem # 4:  MITRAL REGURGITATION, MILD (ICD-396.3) Assessment: Unchanged  Problem # 5:  HYPERTENSION (ICD-401.9) Assessment: Unchanged  Her updated medication list for this problem includes:    Metoprolol Tartrate 25 Mg Tabs (Metoprolol tartrate) .Marland KitchenMarland KitchenMarland KitchenMarland Kitchen 1by mouth two times a day    Hydrochlorothiazide 25 Mg Tabs (Hydrochlorothiazide) ..... Once daily    Altace 10 Mg Caps (Ramipril) ..... Once daily  Problem # 6:  ATRIAL FIBRILLATION (ICD-427.31) Assessment: Unchanged  Her updated medication list for this problem includes:    Metoprolol Tartrate 25 Mg Tabs (Metoprolol tartrate) .Marland KitchenMarland KitchenMarland KitchenMarland Kitchen 1by mouth two times a day    Warfarin Sodium 2.5 Mg Tabs (Warfarin sodium) ..... Use as directed by anticoagualtion clinic  Orders: EKG w/ Interpretation (93000)  Patient Instructions: 1)  Your physician recommends that you schedule a follow-up appointment in: 6 months with Dr. Daleen Squibb 2)  Your physician recommends that you continue on your current medications as directed. Please refer to the Current Medication list given to you today. 3)  Remember to get up slowly when sitting. When getting out of bed sit on the side of the bed before getting up.

## 2010-02-23 ENCOUNTER — Ambulatory Visit (INDEPENDENT_AMBULATORY_CARE_PROVIDER_SITE_OTHER): Payer: Medicare Other | Admitting: Family Medicine

## 2010-02-23 ENCOUNTER — Encounter: Payer: Self-pay | Admitting: Family Medicine

## 2010-02-23 DIAGNOSIS — D649 Anemia, unspecified: Secondary | ICD-10-CM

## 2010-02-23 DIAGNOSIS — E039 Hypothyroidism, unspecified: Secondary | ICD-10-CM

## 2010-02-23 DIAGNOSIS — R350 Frequency of micturition: Secondary | ICD-10-CM

## 2010-02-23 DIAGNOSIS — E785 Hyperlipidemia, unspecified: Secondary | ICD-10-CM

## 2010-02-23 DIAGNOSIS — I10 Essential (primary) hypertension: Secondary | ICD-10-CM

## 2010-02-23 DIAGNOSIS — E559 Vitamin D deficiency, unspecified: Secondary | ICD-10-CM

## 2010-02-23 LAB — LIPID PANEL
HDL: 30 mg/dL — ABNORMAL LOW (ref >39.00)
Triglycerides: 163 mg/dL — ABNORMAL HIGH (ref 0.0–149.0)
VLDL: 32.6 mg/dL (ref 0.0–40.0)

## 2010-02-23 LAB — HEPATIC FUNCTION PANEL
ALT: 15 U/L (ref 0–35)
Albumin: 4 g/dL (ref 3.5–5.2)
Alkaline Phosphatase: 18 U/L — ABNORMAL LOW (ref 39–117)
Total Protein: 7.1 g/dL (ref 6.0–8.3)

## 2010-02-23 LAB — BASIC METABOLIC PANEL
BUN: 20 mg/dL (ref 6–23)
CO2: 28 mEq/L (ref 19–32)
Calcium: 9.2 mg/dL (ref 8.4–10.5)
GFR: 41.46 mL/min — ABNORMAL LOW (ref >60.00)
Glucose, Bld: 97 mg/dL (ref 70–99)

## 2010-02-23 LAB — POCT URINALYSIS DIPSTICK
Bilirubin, UA: NEGATIVE
Glucose, UA: NEGATIVE
Ketones, UA: NEGATIVE
Spec Grav, UA: 1.01
pH, UA: 5.5

## 2010-02-23 LAB — CBC WITH DIFFERENTIAL/PLATELET
Basophils Absolute: 0 10*3/uL (ref 0.0–0.1)
Eosinophils Absolute: 0 10*3/uL (ref 0.0–0.7)
Hemoglobin: 13.2 g/dL (ref 12.0–15.0)
Lymphocytes Relative: 28.2 % (ref 12.0–46.0)
Monocytes Relative: 6.1 % (ref 3.0–12.0)
Neutro Abs: 3.2 10*3/uL (ref 1.4–7.7)
Neutrophils Relative %: 64.5 % (ref 43.0–77.0)
RBC: 4.11 Mil/uL (ref 3.87–5.11)
RDW: 14.4 % (ref 11.5–14.6)

## 2010-02-23 MED ORDER — WARFARIN SODIUM 2.5 MG PO TABS: 2.5000 mg | ORAL_TABLET | Freq: Every day | ORAL | Status: DC

## 2010-02-23 MED ORDER — RAMIPRIL 10 MG PO CAPS: 10.0000 mg | ORAL_CAPSULE | Freq: Every day | ORAL | Status: DC

## 2010-02-23 MED ORDER — ESOMEPRAZOLE MAGNESIUM 40 MG PO CPDR: 40.0000 mg | DELAYED_RELEASE_CAPSULE | Freq: Every day | ORAL | Status: DC

## 2010-02-23 MED ORDER — HYDROCHLOROTHIAZIDE 25 MG PO TABS: 25.0000 mg | ORAL_TABLET | Freq: Every day | ORAL | Status: DC

## 2010-02-23 MED ORDER — SOLIFENACIN SUCCINATE 5 MG PO TABS: 5.0000 mg | ORAL_TABLET | Freq: Every day | ORAL | Status: DC

## 2010-02-23 MED ORDER — ATORVASTATIN CALCIUM 20 MG PO TABS: 20.0000 mg | ORAL_TABLET | Freq: Every day | ORAL | Status: DC

## 2010-02-23 MED ORDER — ALPRAZOLAM 0.25 MG PO TABS: 0.2500 mg | ORAL_TABLET | Freq: Three times a day (TID) | ORAL | Status: DC

## 2010-02-23 MED ORDER — ESTRADIOL 0.5 MG PO TABS: 0.5000 mg | ORAL_TABLET | Freq: Every day | ORAL | Status: DC

## 2010-02-23 MED ORDER — METOPROLOL TARTRATE 25 MG PO TABS: 25.0000 mg | ORAL_TABLET | Freq: Two times a day (BID) | ORAL | Status: DC

## 2010-02-23 NOTE — Patient Instructions (Addendum)
For chest cold or congestion, buy Mucinex DM maximum strengh 1 AM and PM Will call lab results Have refilled medications Take 1 vesicare daily for urinary incontinence or urgency, call I will give you a presciption

## 2010-02-24 LAB — VITAMIN D 25 HYDROXY (VIT D DEFICIENCY, FRACTURES): Vit D, 25-Hydroxy: 26 ng/mL — ABNORMAL LOW (ref 30–89)

## 2010-02-25 ENCOUNTER — Encounter: Payer: Self-pay | Admitting: Family Medicine

## 2010-02-25 NOTE — Progress Notes (Signed)
Subjective:    Patient ID: Duke Salvia, female    DOB: 1924-12-18, 77 y.o.   MRN: [id number] This 77 year old white widowed is in for yearly evaluation of medical problems and refill medications and obtain this her laboratory studies.. She had been seen in the office and in the past 2 months has had a recurrent episodes of diarrhea and was hospitalized on one occasion and rehydrated and was under control. In January she was seen by Dr. Arlyce Dice who performed a soft healed bowel rotation as well as inject Botox to relieve the patient's dysphagia and esophageal obstruction She continues to see Dr. Elijah Birk will fall who oversees her Coumadin therapy secondary to her cardiac problems. She has had atrial fibrillation as well as mitral valve regurgitation and had been on anticoagulant TURP. HPI    Review of Systems  Constitutional: Negative for fever, activity change, appetite change, fatigue and unexpected weight change.  HENT: Negative.   Eyes: Negative.   Respiratory: Negative.   Cardiovascular: Negative for chest pain, palpitations and leg swelling.       Past history of atrial fibrillation which is under control as had some peripheral edema which is controlled with hydrochlorothiazide her blood pressures been well controlled  Gastrointestinal: Negative for abdominal pain, abdominal distention and anal bleeding.       Has had a recent history of diarrhea which is now controlled also esophageal obstruction which have been relieved with elevation of all of Botox injection GERD is controlled with medication, MAC Nexium  Musculoskeletal: Negative.   Neurological: Negative.   Hematological: Negative.   Psychiatric/Behavioral:       Patient has had problems with anxiety and stress which is relieved with alprazolam satisfactorily  patient also has some problem with urinary incontinence and urgency we will describe a medication    Objective:   Physical Exam  Constitutional: She is oriented to person,  place, and time. She appears well-nourished. No distress.  HENT:  Head: Normocephalic and atraumatic.  Right Ear: External ear normal.  Left Ear: External ear normal.  Nose: Nose normal.  Eyes: Conjunctivae and EOM are normal. Pupils are equal, round, and reactive to light. Right eye exhibits no discharge. Left eye exhibits no discharge. No scleral icterus.  Neck: Neck supple. No tracheal deviation present. No thyromegaly present.  Cardiovascular: Normal rate and regular rhythm.        Mitral valve murmur diastolic grade 2  Pulmonary/Chest: Effort normal and breath sounds normal. No stridor. No respiratory distress. She has no wheezes. She has no rales. She exhibits no tenderness. Right breast exhibits no inverted nipple, no mass, no nipple discharge, no skin change and no tenderness. Left breast exhibits no inverted nipple, no mass, no nipple discharge, no skin change and no tenderness. Breasts are symmetrical.  Abdominal: Soft. She exhibits no distension and no mass. There is no tenderness. There is no rebound and no guarding.  Genitourinary:       Not examined  Musculoskeletal: Normal range of motion. She exhibits edema.  Lymphadenopathy:    She has no cervical adenopathy.  Neurological: She is alert and oriented to person, place, and time. She has normal reflexes. She displays normal reflexes. No cranial nerve deficit. She exhibits normal muscle tone.  Skin: Skin is warm and dry. No rash noted. She is not diaphoretic. No erythema. No pallor.  Psychiatric: She has a normal mood and affect. Her behavior is normal. Judgment and thought content normal.  Assessment & Plan:atient doing well physically to continue regular medications as previously prescribed. Plan to obtain necessary laboratory studiesrescribed  Prescribed and will obtain necessary laboratory studies

## 2010-03-03 ENCOUNTER — Encounter: Payer: Self-pay | Admitting: Cardiovascular Disease

## 2010-03-03 ENCOUNTER — Encounter (INDEPENDENT_AMBULATORY_CARE_PROVIDER_SITE_OTHER): Payer: Medicare Other

## 2010-03-03 ENCOUNTER — Encounter: Payer: Self-pay | Admitting: Cardiology

## 2010-03-03 DIAGNOSIS — I4891 Unspecified atrial fibrillation: Secondary | ICD-10-CM

## 2010-03-03 DIAGNOSIS — Z7901 Long term (current) use of anticoagulants: Secondary | ICD-10-CM

## 2010-03-07 ENCOUNTER — Other Ambulatory Visit (INDEPENDENT_AMBULATORY_CARE_PROVIDER_SITE_OTHER): Payer: Medicare Other | Admitting: Family Medicine

## 2010-03-07 DIAGNOSIS — D649 Anemia, unspecified: Secondary | ICD-10-CM

## 2010-03-07 LAB — HEMOCCULT GUIAC POC 1CARD (OFFICE)
Card #2 Fecal Occult Blod, POC: NEGATIVE
Card #3 Fecal Occult Blood, POC: NEGATIVE
Fecal Occult Blood, POC: NEGATIVE

## 2010-03-09 NOTE — Medication Information (Signed)
Summary: rov/ewj  Anticoagulant Therapy  Managed by: Bethena Midget, RN, BSN Referring MD: Valera Castle MD PCP: Dianna Limbo, MD Supervising MD: Clifton James MD, Cristal Deer Indication 1: Atrial Fibrillation (ICD-427.31) Lab Used: [facility] Site: [facility] INR POC 2.4 INR RANGE 2 - 3  Dietary changes: no    Health status changes: no    Bleeding/hemorrhagic complications: no    Recent/future hospitalizations: no    Any changes in medication regimen? no    Recent/future dental: no  Any missed doses?: no       Is patient compliant with meds? yes       Allergies: 1)  ! Sulfa 2)  ! Enablex (Darifenacin Hydrobromide)  Anticoagulation Management History:      The patient is taking warfarin and comes in today for a routine follow up visit.  Positive risk factors for bleeding include an age of 75 years or older and history of GI bleeding.  The bleeding index is 'intermediate risk'.  Positive CHADS2 values include History of HTN and Age > 95 years old.  The start date was 01/03/2006.  Her last INR was 2.5.  Anticoagulation responsible provider: Clifton James MD, Cristal Deer.  INR POC: 2.4.  Cuvette Lot#: [id number].  Exp: 01/2011.    Anticoagulation Management Assessment/Plan:      The patient's current anticoagulation dose is Warfarin sodium 2.5 mg tabs: Use as directed by Anticoagualtion Clinic.  The target INR is 2 - 3.  The next INR is due 03/31/2010.  Anticoagulation instructions were given to patient.  Results were reviewed/authorized by Bethena Midget, RN, BSN.  She was notified by Bethena Midget, RN, BSN.         Prior Anticoagulation Instructions: INR 2.6 Continue 1 pill everyday except 1/2 pill on Mondays. Recheck in 4 weeks.   Current Anticoagulation Instructions: INR 2.4 Continue 1 pill everyday except 1/2 pill on Mondays. Recheck in 4 weeks.

## 2010-03-19 LAB — URINE MICROSCOPIC-ADD ON

## 2010-03-19 LAB — CBC
Hemoglobin: 12.4 g/dL (ref 12.0–15.0)
MCV: 92.4 fL (ref 78.0–100.0)
RBC: 3.91 MIL/uL (ref 3.87–5.11)
WBC: 4.9 10*3/uL (ref 4.0–10.5)

## 2010-03-19 LAB — URINALYSIS, ROUTINE W REFLEX MICROSCOPIC
Bilirubin Urine: NEGATIVE
Hgb urine dipstick: NEGATIVE
Ketones, ur: NEGATIVE mg/dL
Nitrite: NEGATIVE
Specific Gravity, Urine: 1.02 (ref 1.005–1.030)
Urobilinogen, UA: 1 mg/dL (ref 0.0–1.0)

## 2010-03-19 LAB — POCT CARDIAC MARKERS
CKMB, poc: <1 ng/mL — ABNORMAL LOW (ref 1.0–8.0)
Troponin i, poc: <0.05 ng/mL (ref 0.00–0.09)

## 2010-03-19 LAB — BASIC METABOLIC PANEL
CO2: 29 mEq/L (ref 19–32)
Calcium: 8.7 mg/dL (ref 8.4–10.5)
Chloride: 107 mEq/L (ref 96–112)
Creatinine, Ser: 1.3 mg/dL — ABNORMAL HIGH (ref 0.4–1.2)
GFR calc Af Amer: 47 mL/min — ABNORMAL LOW (ref >60)
Sodium: 147 mEq/L — ABNORMAL HIGH (ref 135–145)

## 2010-03-19 LAB — PROTIME-INR: INR: 1.91 — ABNORMAL HIGH (ref 0.00–1.49)

## 2010-03-19 LAB — DIFFERENTIAL
Lymphs Abs: 1.8 10*3/uL (ref 0.7–4.0)
Monocytes Absolute: 0.4 10*3/uL (ref 0.1–1.0)
Monocytes Relative: 8 % (ref 3–12)
Neutro Abs: 2.6 10*3/uL (ref 1.7–7.7)
Neutrophils Relative %: 54 % (ref 43–77)

## 2010-03-31 ENCOUNTER — Ambulatory Visit (INDEPENDENT_AMBULATORY_CARE_PROVIDER_SITE_OTHER): Payer: Medicare Other | Admitting: *Deleted

## 2010-03-31 DIAGNOSIS — I4891 Unspecified atrial fibrillation: Secondary | ICD-10-CM

## 2010-03-31 DIAGNOSIS — Z7901 Long term (current) use of anticoagulants: Secondary | ICD-10-CM

## 2010-03-31 NOTE — Patient Instructions (Signed)
INR 2.3 Continue taking 1 tablet (2.5mg ) daily, except take 1/2 tablet (1.75mg ) on Mondays. Recheck in 4 weeks.

## 2010-04-11 LAB — CBC
HCT: 36.9 % (ref 36.0–46.0)
Hemoglobin: 10.2 g/dL — ABNORMAL LOW (ref 12.0–15.0)
Hemoglobin: 12.8 g/dL (ref 12.0–15.0)
MCHC: 34.7 g/dL (ref 30.0–36.0)
MCHC: 35.8 g/dL (ref 30.0–36.0)
MCV: 90.5 fL (ref 78.0–100.0)
MCV: 91.4 fL (ref 78.0–100.0)
Platelets: 184 10*3/uL (ref 150–400)
RBC: 3.91 MIL/uL (ref 3.87–5.11)
RDW: 14.3 % (ref 11.5–15.5)
RDW: 13.4 % (ref 11.5–15.5)

## 2010-04-11 LAB — BASIC METABOLIC PANEL
BUN: 12 mg/dL (ref 6–23)
CO2: 30 mEq/L (ref 19–32)
Calcium: 8.2 mg/dL — ABNORMAL LOW (ref 8.4–10.5)
Creatinine, Ser: 1.17 mg/dL (ref 0.4–1.2)
Glucose, Bld: 111 mg/dL — ABNORMAL HIGH (ref 70–99)

## 2010-04-11 LAB — COMPREHENSIVE METABOLIC PANEL
AST: 25 U/L (ref 0–37)
Albumin: 4.2 g/dL (ref 3.5–5.2)
BUN: 17 mg/dL (ref 6–23)
CO2: 27 mEq/L (ref 19–32)
Calcium: 8.9 mg/dL (ref 8.4–10.5)
Chloride: 103 mEq/L (ref 96–112)
Creatinine, Ser: 1.2 mg/dL (ref 0.4–1.2)
Creatinine, Ser: 1.2 mg/dL (ref 0.4–1.2)
GFR calc Af Amer: 52 mL/min — ABNORMAL LOW (ref >60)
GFR calc non Af Amer: 43 mL/min — ABNORMAL LOW (ref >60)
Glucose, Bld: 134 mg/dL — ABNORMAL HIGH (ref 70–99)
Glucose, Bld: 131 mg/dL — ABNORMAL HIGH (ref 70–99)
Total Bilirubin: 0.4 mg/dL (ref 0.3–1.2)
Total Protein: 7.9 g/dL (ref 6.0–8.3)

## 2010-04-11 LAB — PROTIME-INR
INR: 2.2 — ABNORMAL HIGH (ref 0.00–1.49)
INR: 1.9 — ABNORMAL HIGH (ref 0.00–1.49)
Prothrombin Time: 26.1 seconds — ABNORMAL HIGH (ref 11.6–15.2)
Prothrombin Time: 23.3 seconds — ABNORMAL HIGH (ref 11.6–15.2)

## 2010-04-11 LAB — URINALYSIS, ROUTINE W REFLEX MICROSCOPIC
Hgb urine dipstick: NEGATIVE
Nitrite: NEGATIVE
Nitrite: NEGATIVE
Protein, ur: NEGATIVE mg/dL
Specific Gravity, Urine: 1.017 (ref 1.005–1.030)
Specific Gravity, Urine: 1.015 (ref 1.005–1.030)
Urobilinogen, UA: 1 mg/dL (ref 0.0–1.0)
Urobilinogen, UA: 0.2 mg/dL (ref 0.0–1.0)

## 2010-04-11 LAB — CULTURE, BLOOD (ROUTINE X 2): Culture: NO GROWTH

## 2010-04-11 LAB — DIFFERENTIAL
Basophils Absolute: 0 10*3/uL (ref 0.0–0.1)
Basophils Relative: 0 % (ref 0–1)
Lymphocytes Relative: 6 % — ABNORMAL LOW (ref 12–46)
Lymphocytes Relative: 11 % — ABNORMAL LOW (ref 12–46)
Lymphs Abs: 0.6 10*3/uL — ABNORMAL LOW (ref 0.7–4.0)
Monocytes Absolute: 0.4 10*3/uL (ref 0.1–1.0)
Neutro Abs: 8.9 10*3/uL — ABNORMAL HIGH (ref 1.7–7.7)
Neutro Abs: 8.1 10*3/uL — ABNORMAL HIGH (ref 1.7–7.7)
Neutrophils Relative %: 87 % — ABNORMAL HIGH (ref 43–77)
Neutrophils Relative %: 84 % — ABNORMAL HIGH (ref 43–77)

## 2010-04-11 LAB — URINE MICROSCOPIC-ADD ON

## 2010-04-11 LAB — URINE CULTURE

## 2010-04-28 ENCOUNTER — Ambulatory Visit (INDEPENDENT_AMBULATORY_CARE_PROVIDER_SITE_OTHER): Payer: Medicare Other | Admitting: *Deleted

## 2010-04-28 DIAGNOSIS — I4891 Unspecified atrial fibrillation: Secondary | ICD-10-CM

## 2010-04-28 LAB — POCT INR: INR: 2.5

## 2010-05-16 NOTE — Assessment & Plan Note (Signed)
[facility]                            CARDIOLOGY OFFICE NOTE   NAME:[name] [name], [name] [name]                       MRN:          [id number]  DATE:08/07/2007                            DOB:          [birth year]    HISTORY OF PRESENT ILLNESS:  [name] [name] returns today for further  management of her paroxysmal atrial fibrillation.  She has had very few  tachypalpitations.  She is on anticoagulation, but is very frustrated  with her Coumadin levels going up and down.  I have told her this was  unusual.   She has hypertension, hyperlipidemia, mild mitral valve prolapse with  mild mitral regurgitation by echocardiogram in January 2008.   She visited the emergency room on March 15, 2007 for some dizziness.  No  etiology was found.  Her blood work was normal at that time including a  comprehensive metabolic panel, CBC, INR of 2.1, and a UA was clean.   MEDICATIONS:  1. Lipitor 20 mg a day.  2. HCTZ 25 mg a day.  3. Xanax 0.25 mg t.i.d.  4. Altace 10 mg a day.  5. Calcium and vitamin D.  6. Warfarin.  7. Estradiol 1 mg half a tablet daily.  8. Metoprolol tartrate 25 mg p.o. b.i.d.  9. Vitamin D.  10.Nexium 40 mg.   PHYSICAL EXAMINATION:  VITAL SIGNS:  Her blood pressure today is 137/72,  her pulse is 60 and regular.  HEENT:  Unchanged.  NECK:  Carotid upstrokes are equal bilaterally without bruits, no JVD.  Thyroid is not enlarged.  Trachea is midline.  LUNGS:  Clear.  HEART:  Reveals a regular rate and rhythm.  No obvious click.  ABDOMEN:  Soft.  EXTREMITIES:  Reveal no edema.  Pulses are intact.  NEURO:  Exam is intact.   A chest x-ray was also reviewed from March 27, 2007, which showed  probable hiatal hernia, stable scarring in the upper lungs, and normal  cardiac silhouette.   ASSESSMENT AND PLAN:  [name] [name] is doing well.  I have made no changes  in her program.  We will plan on seeing her back again in 6 months.     Thomas C. Daleen Squibb, MD,  [facility]  Electronically Signed    TCW/MedQ  DD: 08/07/2007  DT: 08/08/2007  Job #: [id number]

## 2010-05-16 NOTE — Assessment & Plan Note (Signed)
[facility]                            CARDIOLOGY OFFICE NOTE   NAME:[name] [name], [name] [name]                       MRN:          [id number]  DATE:05/15/2006                            DOB:          05-04-1924    [name] [name] returns today after being admitted to the hospital on April 29, 2006, with presyncope.  She was found to have an atrial tachycardia  and was evaluated again by Duke Salvia, MD.  He recommended no  changes in her medications.   Since discharge she has had no further spells.   She is anxious to travel to [facility] with her daughter, which I have  approved of.   Her medications are unchanged from the last visit.  She is currently on:  1. Lipitor 20 mg a day.  2. Hydrochlorothiazide 25 mg a day.  3. Estradiol 0.5 mg daily.  4. Xanax 0.25 mg t.i.d.  5. Altace 10 mg a day.  6. Caltrate 600 mg and vitamin D daily.  7. Warfarin 2.5 mg daily.   PHYSICAL EXAMINATION:  VITAL SIGNS:  Her blood pressure today is 150/62,  her pulse is 59 and regular.  Weight is 132.  GENERAL:  She is in no acute distress.  NECK:  Supple.  Carotid upstrokes are equal bilaterally without bruits.  There is no JVD.  Thyroid is not enlarged.  LUNGS:  Clear.  CARDIAC:  Heart reveals a regular rate and rhythm without gallop.  ABDOMEN:  Soft.  EXTREMITIES:  No edema.  Pulses are present.   [name] [name] is currently asymptomatic.  She has occasional palpitations  but no dizzy spells or presyncope.   I have made no changes in her program.  I renewed her warfarin.  I will  plan on seeing her back in 3 months.     Thomas C. Daleen Squibb, MD, [facility]  Electronically Signed    TCW/MedQ  DD: 05/15/2006  DT: 05/15/2006  Job #: [id number]

## 2010-05-16 NOTE — Assessment & Plan Note (Signed)
[facility]                            CARDIOLOGY OFFICE NOTE   NAME:Mcpartland, LATRISH MOGEL                       MRN:          [id number]  DATE:08/02/2006                            DOB:          Nov 08, 1924    Ms. Blanchette comes in today for further management of the following  issues.  1. Tachy palpitations.  These have been intermittent, but not      sustained.  2. History of supraventricular tachycardia status post radiofrequency      ablation in 2002.  Her last admission for this was April 29, 2006.      It was decided to treat her medically per Dr. Graciela Husbands.  3. History of paroxysmal atrial fibrillation.  4. Chronic anticoagulation.  5. Hypertension.  6. Hyperlipidemia.  7. Anxiety.  8. Mild mitral valve prolapse with mild mitral regurgitation.  She has      normal left ventricular function by 2D echo January 08, 2006.   Her biggest problem is just having problems with her balance and gait.  She denies any other neurological symptoms.  It is not all the time.  It  is not associated with going from supine to standing.   Her meds are unchanged since her last visit.  Please refer to my note  from May 15, 2006.   She is having some dyspnea on exertion as well.  She is having no  angina.   EXAM:  Blood pressure is 132/64, pulse 56 and regular.  EKG shows sinus  brady and moderate voltage criteria for LVH.  It is really unchanged  from before.  HEENT:  Normocephalic, atraumatic.  PERRLA.  Extraocular movements are  intact.  Sclerae clear.  Facial symmetry is normal.  Carotid upstrokes  are equal bilaterally without bruits.  No JVD.  Thyroid is not enlarged.  Trachea is midline.  LUNGS:  Clear.  HEART:  Reveals a nondisplaced PMI.  She has an S4.  Normal S1, S2.  ABDOMEN:  Soft with good bowel sounds.  EXTREMITIES:  No cyanosis, clubbing, or edema.  Pulses are intact.  NEURO:  Intact.   ASSESSMENT AND PLAN:  Ms. Barbian is doing well from our  standpoint.  I  have made no change in her program.  I have arranged for her to have an  adenosine Myoview because of dyspnea on exertion and her age.  She is  also very concerned she may have some heart blockage.   Assuming this is negative, I will see her back in 6 months.     Thomas C. Daleen Squibb, MD, [facility]  Electronically Signed    TCW/MedQ  DD: 08/02/2006  DT: 08/02/2006  Job #: [id number]

## 2010-05-16 NOTE — Assessment & Plan Note (Signed)
[facility]                            CARDIOLOGY OFFICE NOTE   NAME:Roye, AVIONNA BOWER                       MRN:          [id number]  DATE:02/04/2008                            DOB:          [birth year]    Ms. Alcaide comes in today for followup and management of her paroxysmal  atrial fib.  She is in sinus rhythm today with a stable EKG.  She has  had very few tachy palpitations.   As usual, she does not complain.  She denies any presyncope, syncope,  PND, peripheral edema, or dyspnea on exertion.   MEDICATIONS:  Her meds are unchanged since her last visit except her  estradiol has been discontinued.  Also note, she is on VESIcare 5 mg a  day.   PHYSICAL EXAMINATION:  VITAL SIGNS:  Her blood pressure today is 138/66,  her pulse is 61 and she is in sinus rhythm, her weight is stable at 133.  HEENT:  Normal.  NECK:  Carotid upstrokes were equal bilaterally without bruits, no JVD.  Thyroid is not enlarged.  Trachea is midline.  LUNGS:  Clear to auscultation and percussion.  HEART:  Normal S1, S2.  Nondisplaced PMI.  ABDOMEN:  Soft, good bowel sounds.  No midline bruits.  EXTREMITIES:  No cyanosis or clubbing.  She does have trace edema.  Varicose veins are noted.  No sign of DVT.  NEUROLOGIC:  Intact.   ASSESSMENT AND PLAN:  Ms. Rice is doing well.  We will obtain a 2D  echocardiogram.  This has been a couple of years since her last  objective assessment of her mitral regurgitation, which was mild at that  time and left ventricular function as well as right-sided function.  I  will see her back again in 6 months.     Thomas C. Daleen Squibb, MD, [facility]  Electronically Signed    TCW/MedQ  DD: 02/04/2008  DT: 02/05/2008  Job #: [id number]

## 2010-05-16 NOTE — Discharge Summary (Signed)
[name], [name]                ACCOUNT NO.:  [id number]   MEDICAL RECORD NO.:  [id number]          PATIENT TYPE:  OBV   LOCATION:  6526                         FACILITY:  [facility]   PHYSICIAN:  Thomas C. Wall, MD, FACCDATE OF BIRTH:  08/27/1924   DATE OF ADMISSION:  04/29/2006  DATE OF DISCHARGE:  05/01/2006                               DISCHARGE SUMMARY   PRIMARY CARDIOLOGIST:  Dr. Juanito Doom.   EP:  Dr. Berton Mount.   PRIMARY CARE PHYSICIAN:  Rickard Patience.   DISCHARGING DIAGNOSIS:  Recurrent transient atrial tachycardia with a  pre-syncopal episode.   PAST MEDICAL HISTORY:  1. 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  2. Paroxysmal atrial fib.  3. Anticoagulation therapy.  4. Hypertension.  5. Hyperlipidemia.  6. Mild mitral valve prolapse, mild mitral regurgitation.  7. Status post echocardiogram in January 2008 showing EF of 60%.   CONSULTATIONS THIS ADMISSION:  EP.  The patient seen by Loura Pardon and  Berton Mount.  No further workup at this time.   HOSPITAL COURSE:  [name] is an 77 year old Caucasian female with a  past medical history of SVT, status post RVA in 2002.  Recent diagnosis  of paroxysmal atrial fib in January 2008 with incurrent anticoagulation  therapy.  The patient was in her usual state of health until day of  admission when she woke up.  She sat up on the side of the bed and  developed acute light-headedness and felt like she was going to pass  out.  The light-headedness continued.  The patient was brought to the  emergency room by her daughter where she was found not to be  orthostatic.  EKG showed tachycardia with a rate between 103 and 106.  Some difference in P wave morphology suggesting a MAT.  The patient  denied any chest discomfort.  The patient was admitted for observation.  EP was asked to consult.  Continued home medications and checked  orthostatic vital signs.  Ruled out for myocardial infarction.  INR  therapeutic at 2.2.  Will continue her beta blocker.  EP saw the patient  in consultation on April 30, 2006.  Did not recommend any further  treatment at this time.  Dr. Daleen Squibb in to see the patient on the day of  discharge.  The patient is stable.  Heart rate 64 to 100, blood pressure  122/68, afebrile.  The patient is being discharged home with  instructions to continue previous medications and to follow up with Dr.  Daleen Squibb.  If the patient has recurrent dizzy spells, we will proceed with  CardioNet monitor for further evaluation.  At time of discharge, she is  instructed to continue her home medications which include Coumadin 2.5  mg daily, PTR as previously scheduled, ramipril 10 mg daily, with Toprol  25 mg b.i.d., Isordil 2 mg daily, Apresoline 0.25 mg t.i.d. or as  previously instructed, Lipitor 20  mg at bedtime, hydrochlorothiazide 25 mg daily.  The patient instructed  to increase activity slowly.  Follow a low-sodium heart healthy diet.  Follow up with Dr.  Wall May 10, 2006 at 10:30 for re-evaluation.   Duration of discharge encounter is 35 minutes.      Dorian Pod, ACNP      Jesse Sans. Daleen Squibb, MD, [facility]  Electronically Signed    MB/MEDQ  D:  05/01/2006  T:  05/01/2006  Job:  [id number]   cc:   Ellin Saba., MD

## 2010-05-16 NOTE — Assessment & Plan Note (Signed)
[facility]                            CARDIOLOGY OFFICE NOTE   NAME:[name] [name], [name] [name]                       MRN:          [id number]  DATE:02/05/2007                            DOB:          1924-12-15    [name] [name] returns today for manage the following issues:  1. History of tachy palpitations.  2. Paroxysmal atrial fibrillation.  3. History of supraventricular tachycardia status post radiofrequency      ablation in 2002.  4. Chronic anticoagulation.  5. Hypertension.  6. Hyperlipidemia.  7. Anxiety.  8. Mild mitral valve prolapse with mild mitral regurgitation.  Last      echo January 2008.   She has no complaints today.  She specifically denies any palpitations.  She has had a history of postural dizziness in the past, but she is very  careful getting up and down.   Her meds are unchanged since last visit.  Please refer to the  maintenance medication list.   PHYSICAL EXAMINATION:  Her blood pressure is 107/63, her pulse is 70 and  regular.  Her EKG shows probable atrial flutter with a 4:1 AV  conduction.  HEENT:  Normocephalic, atraumatic.  PERRLA.  Extraocular movements are  intact.  Sclerae are clear.  Face symmetry is normal.  NECK: Supple. Carotids are full bilaterally without bruits.  There is no  JVD.  Thyroid is not enlarged.  Trachea is midline.  LUNGS:  Clear.  HEART:  Reveals a regular rate and rhythm without gallop.  ABDOMEN: Soft, good bowel sounds.  EXTREMITIES: Reveals no edema.  Pulses are intact.  NEURO:  Exam is grossly intact.   [name] [name] is doing well.  We have renewed her cardiovascular meds.  I  have made no changes.  Will plan on seeing her back again in 6 months.     Thomas C. Daleen Squibb, MD, [facility]  Electronically Signed    TCW/MedQ  DD: 02/05/2007  DT: 02/06/2007  Job #: [id number]

## 2010-05-16 NOTE — Discharge Summary (Signed)
[name] [name], ROSCH                ACCOUNT NO.:  [id number]   MEDICAL RECORD NO.:  [id number]          PATIENT TYPE:  INP   LOCATION:  5015                         FACILITY:  [facility]   PHYSICIAN:  Bruce Rexene Edison. Swords, MD    DATE OF BIRTH:  1924-11-28   DATE OF ADMISSION:  05/23/2008  DATE OF DISCHARGE:  05/25/2008                               DISCHARGE SUMMARY   DISCHARGE DIAGNOSES:  1. Pneumonia, right upper lobe.  2. Hypertension.  3. Anxiety.  4. Hyperlipidemia.  5. Paroxysmal atrial fibrillation.  6. Chronic anticoagulation.  7. History of mitral valve prolapse with mild regurgitation.  8. History of atrioventricular nodal reentrant tachycardia, status      post ablation in 2002.   DISCHARGE MEDICATIONS:  See med rec form.   HOSPITAL PROCEDURES:  Chest x-ray on May 23, 2008, demonstrated right  upper lobe pneumonia and mild cardiomegaly.   Blood cultures negative to date.   BMET normal except for a glucose of 111, calcium 8.2.  Protime on May 24, 2008, was 2.2.  CBC on May 24, 2008, with a white count of 7.3,  hemoglobin 10.2, white blood cell count on admission 10.2, hemoglobin  12.7.  CMET on admission normal except for glucose 134, alkaline  phosphatase 23.  Note, albumin 3.9 (normal).  Calcium 8.9 (normal).   HOSPITAL COURSE:  The patient admitted to the hospital service on May 23, 2008, see Dr. Drue Novel admission note.  The patient diagnosed with  pneumonia.  The patient treated for community-acquired pneumonia with  Rocephin and Zithromax.  The patient recovered quickly.  At the time of  discharge, the patient was ambulating without difficulty.  She was  tolerating a diet without difficulty.  She has good support at home and  it is safe to go home.  She will follow up with Dr. Scotty Court in 1 week.   CONDITION ON DISCHARGE:  Improved.   FOLLOWUP PLANS:  Dr. Scotty Court in 1 week.      Bruce Rexene Edison Swords, MD  Electronically Signed    BHS/MEDQ  D:  05/25/2008  T:   05/25/2008  Job:  [id number]

## 2010-05-19 NOTE — Discharge Summary (Signed)
[place]  Patient:    [name] [name], [name] [name] Visit Number: [id number] MRN: [id number]          Service Type: CAT Location: 2000 2019 01 Attending Physician:  Lewayne Bunting Dictated by:   Chinita Pester, C.R.N.P. Admit Date:  11/14/2000 Disc. Date: 11/15/00   CC:         Leroy Sea., M.D.  Thomas C. Wall, M.D. [facility]  Kathrine Cords, R.N.   Discharge Summary  DISCHARGE DIAGNOSES: 1. Supraventricular tachycardia.  HISTORY OF PRESENT ILLNESS:  This is a 77 year old female with past medical history of SVT, who complains of weakness and shortness of breath with SVT. Denies chest pain.  She presents for an SVT ablation.  They start and stop suddenly and have been present despite medical therapy.  HOSPITAL COURSE:  The patient was admitted and underwent an EP study which demonstrated slow pathway modification.  The patient underwent a slow pathway modification of AV node reentrant tachycardia with a total of three RF energy applications delivered at sites 6 and 7 on the Coch triangle.  There was no evidence of dual AV nodal pathway physiology and no inducable AV nodal reentrant tachycardia.  The patient had an uncomplicated postprocedural night. She was discharged the following day in stable condition.  DISCHARGE MEDICATIONS:  1. Toprol 25 twice a day.  2. Xanax 0.25 t.i.d. as needed.  3. Estradiol 1 mg daily.  4. Caltrate daily.  5. Zantac 150 daily.  6. Gingko as before.  7. Altace 5 mg daily.  8. Levsin 0.375 mg twice a day.  9. Aspirin 81 daily. 10. Medroxyprak 2.5 mg on the 21st through 30th of each month.  She was instructed to take antibiotics prior to any dental, GYN, urine, or bowel procedures for the next three months.  Low fat, low cholesterol, low salt diet.  She is to call if she develops a lump or any drainage at either of her puncture sites.  She is to scheduled to follow up with Dr. Ladona Ridgel on January 8, at 12:15  p.m. Dictated by:   Chinita Pester, C.R.N.P. Attending Physician:  Lewayne Bunting DD:  11/15/00 TD:  11/15/00 Job: [id number]

## 2010-05-19 NOTE — Assessment & Plan Note (Signed)
[facility]                            CARDIOLOGY OFFICE NOTE   NAME:Flinchbaugh, [name] MELIKIAN                       MRN:          [id number]  DATE:01/17/2006                            DOB:          1924-08-12    Ms. [name] returns today for further management of her paroxysmal  atrial fibrillation. Please see my note from January 03, 2006.   We initiated Coumadin, and her last protime was 3.1. It is being  adjusted. She is not having any difficulties with it. She denies any  melena, any hematochezia or any easy bruisability. She stopped her  aspirin as instructed.   We increased her metoprolol to 25 b.i.d., and she has tolerated this  well. She is in sinus rhythm today at a rate of 64 beats per minute. She  has mild voltage criteria for left ventricular hypertrophy. She had CBC  which showed a hemoglobin 12, platelet count 279. Normal BMP. Normal  TSH.   A 2D echocardiogram showed mild to moderate pulmonary hypertension with  a pulmonary systolic of 49 mmHg, moderate tricuspid regurgitation,  moderate mitral regurgitation which had increased from mild previously,  flap closure of the mitral valve but no definite prolapse, overall  normal left ventricular function, EF 60%, and left atrial dilatation.   She denies any orthopnea, PND or peripheral edema. In fact she is  totally asymptomatic. She cannot tell when she is in atrial  fibrillation.   PHYSICAL EXAMINATION:  VITAL SIGNS:  Blood pressure today is 120/70,  pulse 64 and regular, weight 130 and stable.  HEENT:  Normocephalic, atraumatic. PERRLA, extraocular movements intact,  sclerae clear. She is slightly pale.  NECK:  Supple, carotid upstrokes are equal bilaterally without bruits.  There is no JVD. Thyroid is not enlarged. Trachea is midline.  LUNGS:  Clear.  HEART:  Reveals regular rate and rhythm with soft systolic murmur at the  apex. There is no gallop.  ABDOMEN:  Soft with good bowel  sounds.  EXTREMITIES:  Reveal no edema, pulses are present.  NEUROLOGICAL:  Intact.   She asked me if she could take diclofenac with her Coumadin. I told her  if she could get relief from neck pain with Tylenol to stay with  Tylenol. She seems to think she can stay with the latter.   ASSESSMENT/PLAN:  [name] [name] is doing well. I am glad to see she is  back in sinus rhythm though she is asymptomatic and in atrial  fibrillation. Will continue with the current medications  including the Coumadin and increase metoprolol to 25 b.i.d. Will plan on  seeing her back again in three months.     Thomas C. Daleen Squibb, MD, [facility]  Electronically Signed    TCW/MedQ  DD: 01/17/2006  DT: 01/17/2006  Job #: [id number]   cc:   Ellin Saba., MD

## 2010-05-19 NOTE — Assessment & Plan Note (Signed)
[place] [facility]                            CARDIOLOGY OFFICE NOTE   NAME:[name] [name], [name] [name]                       MRN:          [id number]  DATE:01/03/2006                            DOB:          1924/07/10    [name] comes in today for followup.  She has been having a lot of  palpitations over the last 6 months.  When I saw her in January/2007 she  was in sinus rhythm.  She is in atrial fibrillation today.   PROBLEM LIST:  1. Includes a history of supraventricular tachycardia, status post      atrial ventricular ablation November 2003.  2. Mild mitral valve prolapse with mild to minimal mitral      regurgitation.  3. History of tachy palpitation.  4. History of PVCs.  5. Hypertension.  6. Hyperlipidemia.   She denies any dyspnea on exertion, presyncope, syncope, orthopnea, PND.  Has had some peripheral edema in the summer.  She denies any chest pain  consistent with angina or ischemic symptoms.   She denies any melena, does on occasion have a little bright red blood  in her stool from a rectocele, has had some mild hematuria from a renal  infection in the past, which has been treated.  She has no bleeding  diathesis.  She is steady on her feet, does not fall, is very compliant,  still works at [employer] and drives.   CURRENT MEDICATIONS:  Include:  1. Lipitor 20 mg daily.  2. Hydrochlorothiazide 25 mg.  3. Estradiol 1 mg daily.  4. Aspirin 81 mg daily.  5. Xanax 0.25 t.i.d.  6. Metoprolol 25 mg daily.  7. Altace 10 mg daily.  8. Ginkgo biloba.  9. Vitamin D daily.   She has a history of sinus bradycardia which necessitated decreasing  metoprolol to 25 mg a day in the past.  We will have to watch for  tachybrady.   Her blood pressure today is 132/76, her pulse is 95 to 100 and  irregular.  EKG confirms atrial fib.  Her weight is 130.  HEENT:  Normocephalic.  Atraumatic.  PERRLA, extraocular movements are  intact.  Sclerae clear.  She  wears upper and lower dentures.  Facial  asymmetry is normal.  Carotid upstrokes are equal bilaterally without bruits.  There is no  JVD.  Thyroid is not enlarged. Trachea is midline.  LUNGS:  Are clear to auscultation.  HEART:  Reveals a nondisplaced PMI.  There is very faint systolic  murmur, no click, no gallop.  ABDOMEN:  Is soft with good bowel sounds.  No midline bruit.  There is  no tenderness.  Bowel sounds are present.  There is no hepatomegaly.  EXTREMITIES:  No cyanosis, clubbing or edema.  Pulses were 2+ over 4+  bilaterally, symmetrical.  There is no DVT.  There is no major venous  varicosities.  NEUROLOGIC:  Is intact.  MUSCULOSKELETAL:  Is intact.  SKIN:  Shows very few bruises, if any.   ASSESSMENT AND PLAN:  New onset atrial fibrillation.  This is probably  secondary to advancing  age, hypertension, and history of mitral valve  prolapse.  She has had an AV ablation in the past which is probably  unrelated.   I have spent about 30 minutes talking to [name] today.  She is  clearly a candidate for Coumadin and this will decrease her  thromboembolic risk more than aspirin.  She says she will be compliant  and she will report any bleeding.   PLAN:  1. Begin Coumadin 2.5 mg daily and [facility].  2. Discontinue aspirin.  3. Increase metoprolol to 25 b.i.d. to slow ventricular rate.  4. A 2-D echocardiogram.  5. TSH, CBC and BMT  6. Follow up with me in 2 to 3 weeks for rate control and discuss      further questions of d/c cardioversion.     Thomas C. Daleen Squibb, MD, [facility]  Electronically Signed    TCW/MedQ  DD: 01/03/2006  DT: 01/03/2006  Job #: [id number]   cc:   Ellin Saba., MD

## 2010-05-19 NOTE — Assessment & Plan Note (Signed)
[facility]                            CARDIOLOGY OFFICE NOTE   NAME:[name] [name] HOOBLER                       MRN:          [id number]  DATE:04/10/2006                            DOB:          [birth year]    [name] [name] returns today for further management of her paroxysmal  atrial fibrillation, which is usually asymptomatic.  She has had a few  palpitations, but overall these have been brief.  She is on  anticoagulation.  Her INR has finally settled down and is perfect  today.   She offers no other complaints today.   MEDICATIONS:  Unchanged from the last office note.  Please refer to  that, as well as the maintenance medication list.   PHYSICAL EXAMINATION:  VITAL SIGNS:  Her blood pressure is 118/70, pulse  is 58 and irregular.  EKG shows sinus bradycardia with moderate LVH.  HEENT:  Skin is pale, warm and dry.  Pupils are equal, round and  reactive to light and accommodation.  Extraocular movements intact.  Sclerae are slightly injected.  Facial symmetry is normal.  NECK:  Supple.  Carotid upstrokes are equal bilaterally without bruits.  There is no JVD.  Thyroid is not enlarged.  LUNGS:  Clear.  HEART:  A nondisplaced PMI.  She has normal S1 and S2.  ABDOMEN:  Soft with good bowel sounds.  EXTREMITIES:  No edema.  Pulses are intact.  SKIN:  No ecchymoses!   ASSESSMENT AND PLAN:  [name] [name] is doing well.  I am delighted that  her Coumadin is finally regulated.  I will plan on seeing her back in 6  months.     Thomas C. Daleen Squibb, MD, [facility]  Electronically Signed    TCW/MedQ  DD: 04/10/2006  DT: 04/10/2006  Job #: [id number]   cc:   Ellin Saba., MD

## 2010-05-19 NOTE — Assessment & Plan Note (Signed)
[facility]                         GASTROENTEROLOGY OFFICE NOTE   NAME:[name] [name], [name] [name]                       MRN:          [id number]  DATE:04/18/2006                            DOB:          [birth year]    PROBLEM:  Dysphagia.  [name] has returned following colonoscopy  and upper endoscopy.  The latter demonstrated diffuse diverticulosis.  No polyps were seen.  On endoscopy, she had a distal esophageal  diverticulum, large hiatal hernia, and a narrowing in her distal  esophageus that easily allowed passage of the endoscope.  She was  dilated to 18 mm.  [name] reports improvement in her dysphagia.  Currently, she is not having any further problems.   A barium swallow was ordered because of suspicion for achalasia, based  on her endoscopic findings.  The x-ray confirmed the presence of a large  diverticulum with mild narrowing of the distal esophageus.  There was  severe esophageal dysmotility and a moderate-sized paraesophageal hiatal  hernia.   EXAM:  Pulse 52, blood pressure 122/60, weight 133.   IMPRESSION/RECOMMENDATIONS:  1. Probable achalasia.  In as much as she is not having symptoms at      this point, I would not treat this any further.  Should she develop      recurrent dysphagia, I would consider Botox injection of the lower      esophageal sphincter.  2. Paraesophageal hiatal hernia.  This is not causing symptoms.  There      is always the risk for entraping or incarcerating the hiatal      hernia.  This would require surgical correction.  She does not want      any further therapy at this point.  3. She will return as needed and be treated expectantly.     Barbette Hair. Arlyce Dice, MD,FACG  Electronically Signed    RDK/MedQ  DD: 04/18/2006  DT: 04/18/2006  Job #: [id number]   cc:   Ellin Saba., MD

## 2010-05-19 NOTE — Discharge Summary (Signed)
[name] [name], [name] [name]                ACCOUNT NO.:  [id number]   MEDICAL RECORD NO.:  [id number]          PATIENT TYPE:  OBV   LOCATION:  1844                         FACILITY:  [facility]   PHYSICIAN:  [name] C. Eden Emms, MD, FACCDATE OF BIRTH:  1924/02/12   DATE OF ADMISSION:  04/29/2006  DATE OF DISCHARGE:                               DISCHARGE SUMMARY   PRIMARY CARDIOLOGIST:  [name] Fus C. Wall, MD, [facility].   ELECTROPHYSIOLOGIST:  [name] Canning. Ladona Ridgel, MD.   PATIENT PROFILE:  An 77 year old Caucasian female with prior history of  SVT status post RFA in November of 2002 and recent diagnosis of atrial  fibrillation, who presents to the ED with an episode of lightheadedness  in the setting of tachy palpitations.   PROBLEM LIST:  1. Supraventricular tachycardia/atrioventricular node recovery time.      a.     Status post radiofrequency ablation, November 14, 2000, by       Dr. Gilman [name].  2. Paroxysmal atrial fibrillation diagnosed January 2008.      a.     On chronic Coumadin anticoagulation since January.  3. History of tachy palpitations.  4. Hypertension.  5. Hyperlipidemia.  6. Anxiety.  7. Mild mitral valve prolapse/mild mitral regurgitation.  8. January 08, 2006, 2D echocardiogram:  Ejection fraction of 60%.   HISTORY OF PRESENT ILLNESS:  An 77 year old Caucasian female with prior  history of SVT status post RFA in November of 2002 and recent diagnosis  of paroxysmal atrial fibrillation in January 2008, which has previously  been asymptomatic.  She has been on Coumadin since then, with some  difficulty with regulation.  She was in her usual state of health until  this morning at approximately 5:30 a.m., when upon awakening and sitting  up at the side of her bed, she developed acute lightheadedness and she  felt like like I might pass out.  after approximately 5 minutes, she  felt somewhat better and walked to the bathroom with moderate  lightheadedness and then after about 1 hour,  symptoms lessened but was  still present, prompting her to call her daughter who then brought her  into the [facility] ED.  Here she had orthostatic vital signs performed,  and she was not found to be orthostatic by blood pressure or heart rate.  Notably there is a rhythm strip, where she is tachycardic with rates  between 103 and 106.  It is regular with some difference in the P-wave  morphology, possibly suggestive of multifocal atrial tachycardia.  Currently she is in sinus rhythm and reports just mild lightheadedness.  She denies any chest pain or shortness of breath, PND, orthopnea,  dizziness, syncope, edema, or early satiety.   ALLERGIES:  NONE.   HOME MEDICATIONS:  1. Coumadin as directed.  2. __________10 mg daily.  3. Lopressor 25 mg b.i.d.  4. Estradiol 2 mg daily.  5. Alprazolam 0.25 mg t.i.d.  6. Lipitor 20 mg daily.  7. HCTZ 25 mg daily.   FAMILY HISTORY:  Mother died of diabetes and CVA at age 74.  Father died  of complications of  COPD at age 49.  She had a __________ at 84.  He had  COPD, but she is not sure why he died.   SOCIAL HISTORY:  She lives in [place] with her daughter.  She works  as a Conservation officer, nature at [employer].  She denies any tobacco, alcohol, or drug  use now or ever in her lifetime.  She is active but does not routine  exercise.   REVIEW OF SYSTEMS:  Positive for  presyncope and palpitations outlined  in the HPI.  She does have a history of polyuria per her.  She had a  colonoscopy, she thinks in March of 2008, which showed some  diverticulitis but otherwise has had no problems.  Other systems  reviewed are negative.   PHYSICAL EXAMINATION:  VITAL SIGNS:  Temperature 97.2, heart rate 63,  respirations 16, blood pressure 157/78.  GENERAL:  Pleasant white female in no acute distress.  Awake, alert, and  oriented x3.  HEENT:  Normal.  NECK:  No bruits or JVD.  LUNGS:  Respirations regular, unlabored.  Clear to auscultation.  CARDIAC:  Regular,  S1, S2.  No S3, S4, or murmurs.  ABDOMEN:  Round, soft, nontender, nondistended.  Bowel sounds present  x4.  EXTREMITIES:  Warm, dry, and pink with no clubbing, cyanosis, or edema.  Dorsal pedis and tibial pulses 2+ bilaterally.   HEAD CT:  Showed no active intracranial abnormalities.  EKG shows sinus  rhythm with normal axis and rate of 60.  Rhythm strip more suggestive of  multifocal atrial tachycardia.   LABORATORY WORK:  Sodium 139, potassium 4.0, chloride 107, CO2 of 25.3,  BUN 20, creatinine 1.3, glucose 101, PT 25.5, INR 2.2.   ASSESSMENT AND PLAN:  1. Palpitations/lightheadedness:  The patient has a long history of      palpitations along with supraventricular tachycardia status post      ablation in November of 2002.  She was recently diagnosed with      paroxysmal atrial fibrillation, which is apparently asymptomatic.      She is currently in sinus rhythm, although there is a rhythm strip      from earlier during her stay in the ER that is suggestive of a      multifocal atrial tachycardia with multiple P-wave morphology.      Plan to admit her for observation.  We have discussed her case with      Dr.  Ladona [name], given her rhythm issues.  We will check orthostatics      and magnesium.  She has normal TSH in January of 2008 and again in      February of 2008.  She will continue her home medications.  2. Pulmonary embolus status post radiofrequency ablation.  3. Atrial fibrillation:  See #1.  Continue beta blocker and Coumadin      therapy.  She is currently in sinus.  4. Hypertension:  Blood pressure is currently elevated.  We will      follow and adjust meds if necessary.  5. Hyperlipidemia:  Continue Statin therapy.  We will check liver      function tests.  6. Anxiety:  Continue her Ativan, which she takes at home.      [name] [name], ANP      [name] [name]. Eden Emms, MD, [facility]  Electronically Signed   CB/MEDQ  D:  04/29/2006  T:  04/29/2006  Job:  [id number]

## 2010-05-19 NOTE — Assessment & Plan Note (Signed)
[facility]                         GASTROENTEROLOGY OFFICE NOTE   NAME:[name] [name], [name] [name]                       MRN:          [id number]  DATE:03/06/2006                            DOB:          1924-02-28    PROBLEM:  History of colon polyps.   [name] [name] is a pleasant 77 year old white female referred through the  courtesy of Dr. Scotty Court for evaluation.  An adenomatous polyp was  removed in 2003.  [name] [name] has no lower GI complaints including  change in bowel habits, abdominal pain or rectal bleeding.  She does  complain of intermittent dysphagia to solids.  There is no history of  pyrosis.   PAST MEDICAL HISTORY:  Pertinent for hypertension and arrhythmias for  which she is on Coumadin.  She is status post hysterectomy and tubal  ligation.  She has a history of supraventricular tachycardia and is  status post AV ablation and prolapsed mitral valve.   FAMILY HISTORY:  Pertinent for mother with diabetes.   MEDICATIONS:  Lipitor, hydrochlorothiazide, estradiol, Xanax, Altace,  metoprolol, Caltrate and Coumadin.   ALLERGIES:  She has no allergies.   She neither smokes or drinks.  She is widowed and is retired.   REVIEW OF SYSTEMS:  Positive for joint pains and some urinary  incontinence.   PHYSICAL EXAMINATION:  Pulse 64, blood pressure 132/72.  Weight 132.  HEENT: EOMI.  PERRLA.  Sclerae are anicteric.  Conjunctivae are pink.  NECK:  Supple without thyromegaly, adenopathy or carotid bruits.  CHEST:  Clear to auscultation and percussion without adventitious  sounds.  CARDIAC:  Regular rhythm; normal S1, S2.  There are no murmurs, gallops  or rubs.  ABDOMEN:  Bowel sounds are normoactive.  Abdomen is soft, non-tender and  non-distended.  There are no abdominal masses, tenderness, splenic  enlargement or hepatomegaly.  EXTREMITIES:  Full range of motion.  No cyanosis, clubbing or edema.  RECTAL:  Deferred.   IMPRESSION:  1. History of  colon polyps.  2. Dysphagia -- rule out early esophageal stricture.  3. History of arrhythmias -- on Coumadin.   RECOMMENDATIONS:  1. Colonoscopy.  2. Upper endoscopy with dilatations indicated (to be done at the same      time as her colonoscopy while Coumadin is held.     Barbette Hair. Arlyce Dice, MD,FACG  Electronically Signed    RDK/MedQ  DD: 03/06/2006  DT: 03/06/2006  Job #: [id number]   cc:   Ellin Saba., MD

## 2010-05-26 ENCOUNTER — Ambulatory Visit (INDEPENDENT_AMBULATORY_CARE_PROVIDER_SITE_OTHER): Payer: Medicare Other | Admitting: *Deleted

## 2010-05-26 DIAGNOSIS — I4891 Unspecified atrial fibrillation: Secondary | ICD-10-CM

## 2010-05-26 LAB — POCT INR: INR: 2.2

## 2010-06-22 ENCOUNTER — Ambulatory Visit (INDEPENDENT_AMBULATORY_CARE_PROVIDER_SITE_OTHER): Payer: Medicare Other | Admitting: *Deleted

## 2010-06-22 DIAGNOSIS — I4891 Unspecified atrial fibrillation: Secondary | ICD-10-CM

## 2010-06-22 LAB — POCT INR: INR: 2.4

## 2010-06-23 ENCOUNTER — Encounter: Payer: Medicare Other | Admitting: *Deleted

## 2010-07-20 ENCOUNTER — Encounter: Payer: Self-pay | Admitting: Cardiology

## 2010-07-20 ENCOUNTER — Ambulatory Visit (INDEPENDENT_AMBULATORY_CARE_PROVIDER_SITE_OTHER): Payer: Medicare Other | Admitting: *Deleted

## 2010-07-20 DIAGNOSIS — I4891 Unspecified atrial fibrillation: Secondary | ICD-10-CM

## 2010-07-20 LAB — POCT INR: INR: 1.6

## 2010-07-25 ENCOUNTER — Other Ambulatory Visit: Payer: Self-pay | Admitting: Family Medicine

## 2010-07-25 DIAGNOSIS — Z1231 Encounter for screening mammogram for malignant neoplasm of breast: Secondary | ICD-10-CM

## 2010-08-03 ENCOUNTER — Ambulatory Visit (INDEPENDENT_AMBULATORY_CARE_PROVIDER_SITE_OTHER): Payer: Medicare Other | Admitting: *Deleted

## 2010-08-03 DIAGNOSIS — I4891 Unspecified atrial fibrillation: Secondary | ICD-10-CM

## 2010-08-03 LAB — POCT INR: INR: 2.4

## 2010-08-18 ENCOUNTER — Ambulatory Visit
Admission: RE | Admit: 2010-08-18 | Discharge: 2010-08-18 | Disposition: A | Discharge status: Home / Self Care | Payer: Medicare Other | Source: Ambulatory Visit | Attending: Family Medicine | Admitting: Family Medicine

## 2010-08-18 DIAGNOSIS — Z1231 Encounter for screening mammogram for malignant neoplasm of breast: Secondary | ICD-10-CM

## 2010-08-21 ENCOUNTER — Emergency Department (INDEPENDENT_AMBULATORY_CARE_PROVIDER_SITE_OTHER): Payer: Medicare Other

## 2010-08-21 ENCOUNTER — Emergency Department (HOSPITAL_BASED_OUTPATIENT_CLINIC_OR_DEPARTMENT_OTHER)
Admission: EM | Admit: 2010-08-21 | Discharge: 2010-08-21 | Disposition: A | Discharge status: Home / Self Care | Payer: Medicare Other | Attending: Emergency Medicine | Admitting: Emergency Medicine

## 2010-08-21 ENCOUNTER — Encounter (HOSPITAL_BASED_OUTPATIENT_CLINIC_OR_DEPARTMENT_OTHER): Payer: Self-pay

## 2010-08-21 DIAGNOSIS — R269 Unspecified abnormalities of gait and mobility: Secondary | ICD-10-CM | POA: Insufficient documentation

## 2010-08-21 DIAGNOSIS — M25559 Pain in unspecified hip: Secondary | ICD-10-CM

## 2010-08-21 DIAGNOSIS — M5430 Sciatica, unspecified side: Secondary | ICD-10-CM

## 2010-08-21 DIAGNOSIS — K589 Irritable bowel syndrome without diarrhea: Secondary | ICD-10-CM | POA: Insufficient documentation

## 2010-08-21 DIAGNOSIS — M549 Dorsalgia, unspecified: Secondary | ICD-10-CM

## 2010-08-21 DIAGNOSIS — Z79899 Other long term (current) drug therapy: Secondary | ICD-10-CM | POA: Insufficient documentation

## 2010-08-21 DIAGNOSIS — E785 Hyperlipidemia, unspecified: Secondary | ICD-10-CM | POA: Insufficient documentation

## 2010-08-21 DIAGNOSIS — I4891 Unspecified atrial fibrillation: Secondary | ICD-10-CM | POA: Insufficient documentation

## 2010-08-21 DIAGNOSIS — E039 Hypothyroidism, unspecified: Secondary | ICD-10-CM | POA: Insufficient documentation

## 2010-08-21 DIAGNOSIS — M5137 Other intervertebral disc degeneration, lumbosacral region: Secondary | ICD-10-CM

## 2010-08-21 DIAGNOSIS — K219 Gastro-esophageal reflux disease without esophagitis: Secondary | ICD-10-CM | POA: Insufficient documentation

## 2010-08-21 DIAGNOSIS — I10 Essential (primary) hypertension: Secondary | ICD-10-CM | POA: Insufficient documentation

## 2010-08-21 HISTORY — DX: Unspecified atrial fibrillation: I48.91

## 2010-08-21 LAB — PROTIME-INR
INR: 2.13 — ABNORMAL HIGH (ref 0.00–1.49)
Prothrombin Time: 24.2 seconds — ABNORMAL HIGH (ref 11.6–15.2)

## 2010-08-21 MED ORDER — HYDROCODONE-ACETAMINOPHEN 5-500 MG PO TABS: 1.0000 | ORAL_TABLET | ORAL | Status: DC | PRN

## 2010-08-21 MED ORDER — OXYCODONE-ACETAMINOPHEN 5-325 MG PO TABS
1.0000 | ORAL_TABLET | Freq: Once | ORAL | Status: AC
  Administered 2010-08-21: 1 via ORAL
  Filled 2010-08-21: qty 1

## 2010-08-21 NOTE — ED Notes (Signed)
Pt ambulatory to radiology

## 2010-08-21 NOTE — ED Notes (Signed)
C/O right hip pain that started Wednesday.  Pain increases with weight bearing and lying on affected area.  Denies injury.

## 2010-08-21 NOTE — ED Provider Notes (Signed)
History     CSN: [id number] Arrival date & time: 08/21/2010 10:04 AM  Chief Complaint  Patient presents with  . Hip Pain   Patient is a 77 y.o. female presenting with hip pain. The history is provided by the patient.  Hip Pain Episode onset: 5 days ago. The problem occurs constantly. The problem has not changed since onset.Pertinent negatives include no abdominal pain. Associated symptoms comments: No weakness of the leg.  No numbness or tingling.  No bowel or bladder incontinence.  No perineall numbness.  No fevers or chills. Exacerbated by: Standing and walking. The symptoms are relieved by nothing (Not relieved by Tylenol).    Past Medical History  Diagnosis Date  . Paroxysmal a-fib   . Mitral and aortic regurgitation   . Mitral valve prolapse     hx of  . Hypertension   . Hyperlipidemia   . Vitamin D deficiency   . Pneumonia   . Tracheitis   . Gait difficulty   . Anemia   . Melena   . IBS (irritable bowel syndrome)   . Incontinence   . GERD (gastroesophageal reflux disease)   . Osteopenia   . Thyroid disease     hypo  . Personal history of colonic polyps   . Current use of long term anticoagulation   . Cough   . Diarrhea   . Pneumonia     organism unspecified  . Constipation   . UTI (lower urinary tract infection)   . Unspecified adverse effect of other drug, medicinal and biological substance   . History of mammogram 2009  . Diverticulosis   . GERD (gastroesophageal reflux disease)   . Hyperlipidemia   . Atrial fibrillation     Past Surgical History  Procedure Date  . Abdominal hysterectomy   . Breast biopsy     left  . Cardiac electrophysiology mapping and ablation     Family History  Problem Relation Age of Onset  . Diabetes Mother   . COPD Father   . Heart disease Maternal Aunt   . Heart disease Maternal Uncle     History  Substance Use Topics  . Smoking status: Never Smoker   . Smokeless tobacco: Never Used  . Alcohol Use: No    OB  History    Grav Para Term Preterm Abortions TAB SAB Ect Mult Living                  Review of Systems  Gastrointestinal: Negative for abdominal pain.  All other systems reviewed and are negative.    Physical Exam  BP 157/81  Pulse 58  Temp(Src) 98.4 F (36.9 C) (Oral)  Resp 16  Ht 5' (1.524 m)  Wt 128 lb (58.06 kg)  BMI 25.00 kg/m2  SpO2 98%  Physical Exam  Constitutional: She is oriented to person, place, and time. She appears well-developed and well-nourished.  HENT:  Head: Normocephalic.  Eyes: EOM are normal.  Neck: Normal range of motion.  Pulmonary/Chest: Effort normal.  Abdominal: Soft.  Musculoskeletal: Normal range of motion.       No tenderness of L-spine.  There is tenderness in the right sciatic groove.  Patient has normal strength in her bilateral lower extremities.  She has normal lower extremity reflexes.  There is no muscle wasting of her right lower extremity she has normal pulses in her right lower extremity.  There is no erythema redness or rash  Neurological: She is alert and oriented to person, place, and  time.  Skin: Skin is warm and dry.  Psychiatric: She has a normal mood and affect.    ED Course  Procedures  MDM Her symptoms sound consistent with sciatica.  She has no numbness or weakness of her right lower extremity.  At this time will treat her pain is she will stay and x-rays her L-spine in her right hip however my suspicion for fracture or pathologic fractures low.  We'll likely refer patient back to her primary care physician as well as to sports medicine specialty.  At this time I don't think she'll benefit from follow up with a spine surgeon unless her symptoms progress.  She does not meet criteria for MRI at this time.  Given the patient's use of Coumadin will check her INR    Dg Lumbar Spine Complete  08/21/2010  *RADIOLOGY REPORT*  Clinical Data: Back pain.  Right hip pain.  LUMBAR SPINE - COMPLETE 4+ VIEW  Comparison: 04/20/2005   Findings: Five lumbar-type vertebral bodies show normal alignment in the AP projection.  There is no disc space narrowing.  There is facet arthropathy in the lower lumbar spine with 2 mm of anterolisthesis at L5-S1.  No other focal lesion.  There is atherosclerosis of the aorta.  IMPRESSION: Lower lumbar degenerative facet disease with 2 mm of anterolisthesis at L5-S1.  Original Report Authenticated By: Thomasenia Sales, M.D.   Dg Hip Complete Right  08/21/2010  *RADIOLOGY REPORT*  Clinical Data: Pain.  No known injury.  RIGHT HIP - COMPLETE 2+ VIEW  Findings: Sacroiliac joints and symphysis pubis are normal.  Hip joints show normal joint space height.  No fracture, osteophyte formation or other focal lesion.  Impression: Normal radiographs  Original Report Authenticated By: Thomasenia Sales, M.D.    11:36 AM The patient reports she feels much better at this time.  She reports the oral pain medicine and that significantly improved her pain.  X-rays without acute pathology there is 2 mm of anterior listhesis of L5 on S1.  The patient will likely require outpatient MRI if her symptoms continue.  To follow up with her primary care doctor.  Lyanne Co, MD 08/21/10 1136

## 2010-08-21 NOTE — ED Notes (Signed)
MD at bedside. 

## 2010-08-23 ENCOUNTER — Other Ambulatory Visit: Payer: Self-pay

## 2010-08-23 ENCOUNTER — Ambulatory Visit: Payer: Medicare Other | Admitting: Cardiology

## 2010-08-23 ENCOUNTER — Encounter (HOSPITAL_BASED_OUTPATIENT_CLINIC_OR_DEPARTMENT_OTHER): Payer: Self-pay | Admitting: Family Medicine

## 2010-08-23 ENCOUNTER — Emergency Department (HOSPITAL_BASED_OUTPATIENT_CLINIC_OR_DEPARTMENT_OTHER)
Admission: EM | Admit: 2010-08-23 | Discharge: 2010-08-23 | Disposition: A | Discharge status: Home / Self Care | Payer: Medicare Other | Attending: Emergency Medicine | Admitting: Emergency Medicine

## 2010-08-23 ENCOUNTER — Emergency Department (INDEPENDENT_AMBULATORY_CARE_PROVIDER_SITE_OTHER): Payer: Medicare Other

## 2010-08-23 DIAGNOSIS — M25559 Pain in unspecified hip: Secondary | ICD-10-CM | POA: Insufficient documentation

## 2010-08-23 DIAGNOSIS — E785 Hyperlipidemia, unspecified: Secondary | ICD-10-CM | POA: Insufficient documentation

## 2010-08-23 DIAGNOSIS — R269 Unspecified abnormalities of gait and mobility: Secondary | ICD-10-CM | POA: Insufficient documentation

## 2010-08-23 DIAGNOSIS — M1990 Unspecified osteoarthritis, unspecified site: Secondary | ICD-10-CM | POA: Insufficient documentation

## 2010-08-23 DIAGNOSIS — R110 Nausea: Secondary | ICD-10-CM

## 2010-08-23 DIAGNOSIS — I10 Essential (primary) hypertension: Secondary | ICD-10-CM | POA: Insufficient documentation

## 2010-08-23 DIAGNOSIS — M549 Dorsalgia, unspecified: Secondary | ICD-10-CM

## 2010-08-23 DIAGNOSIS — Z79899 Other long term (current) drug therapy: Secondary | ICD-10-CM | POA: Insufficient documentation

## 2010-08-23 DIAGNOSIS — M169 Osteoarthritis of hip, unspecified: Secondary | ICD-10-CM

## 2010-08-23 LAB — COMPREHENSIVE METABOLIC PANEL
AST: 19 U/L (ref 0–37)
Albumin: 3.7 g/dL (ref 3.5–5.2)
Alkaline Phosphatase: 17 U/L — ABNORMAL LOW (ref 39–117)
BUN: 22 mg/dL (ref 6–23)
Chloride: 105 mEq/L (ref 96–112)
Creatinine, Ser: 1.1 mg/dL (ref 0.50–1.10)
Potassium: 4 mEq/L (ref 3.5–5.1)
Total Protein: 7.2 g/dL (ref 6.0–8.3)

## 2010-08-23 LAB — CBC
Hemoglobin: 12.5 g/dL (ref 12.0–15.0)
MCH: 31.3 pg (ref 26.0–34.0)
MCHC: 34.2 g/dL (ref 30.0–36.0)
RDW: 14.4 % (ref 11.5–15.5)

## 2010-08-23 LAB — DIFFERENTIAL
Basophils Absolute: 0 10*3/uL (ref 0.0–0.1)
Basophils Relative: 0 % (ref 0–1)
Eosinophils Absolute: 0 10*3/uL (ref 0.0–0.7)
Monocytes Relative: 6 % (ref 3–12)
Neutro Abs: 9.1 10*3/uL — ABNORMAL HIGH (ref 1.7–7.7)
Neutrophils Relative %: 89 % — ABNORMAL HIGH (ref 43–77)

## 2010-08-23 LAB — PROTIME-INR
INR: 2.24 — ABNORMAL HIGH (ref 0.00–1.49)
Prothrombin Time: 25.2 seconds — ABNORMAL HIGH (ref 11.6–15.2)

## 2010-08-23 MED ORDER — OXYCODONE-ACETAMINOPHEN 5-325 MG PO TABS: 2.0000 | ORAL_TABLET | ORAL | Status: AC | PRN

## 2010-08-23 MED ORDER — ONDANSETRON HCL 4 MG/2ML IJ SOLN
INTRAMUSCULAR | Status: AC
  Administered 2010-08-23: 10:00:00
  Filled 2010-08-23: qty 2

## 2010-08-23 NOTE — ED Notes (Signed)
MD at bedside. 

## 2010-08-23 NOTE — ED Notes (Signed)
Ambulated pt in room. Pt sts she feels "normal".

## 2010-08-23 NOTE — ED Notes (Signed)
Pt denies nausea at this time. Pt denies pain, shob.

## 2010-08-23 NOTE — ED Provider Notes (Addendum)
History     CSN: [id number] Arrival date & time: 08/23/2010  9:37 AM  Chief Complaint  Patient presents with  . Nausea   HPI Comments: 86yoF h/o osteopenia, arthritis,  afib on coumadin pw Rt hip pain, nausea. Pt seen here two days ago for same with negative XR right hip. States that pain has persisted. Took vicodin and has nausea and gagging with the pain medication. No vomiting. Continues to walk with a cane which is new as of last week when the hip pain began. Pain worse with movement, better at rest. Denies abdominal pain, hematuria/freq/urgency/dysuria. Denies back pain. Denies numbness/tingling/weakness of extremities. Denies chest pain/shortness of breath.  Denies other complaints today. No new trauma/falls. Nausea resolved since given zofran by EMS   Past Medical History  Diagnosis Date  . Paroxysmal a-fib   . Mitral and aortic regurgitation   . Mitral valve prolapse     hx of  . Hypertension   . Hyperlipidemia   . Vitamin D deficiency   . Pneumonia   . Tracheitis   . Gait difficulty   . Anemia   . Melena   . IBS (irritable bowel syndrome)   . Incontinence   . GERD (gastroesophageal reflux disease)   . Osteopenia   . Thyroid disease     hypo  . Personal history of colonic polyps   . Current use of long term anticoagulation   . Cough   . Diarrhea   . Pneumonia     organism unspecified  . Constipation   . UTI (lower urinary tract infection)   . Unspecified adverse effect of other drug, medicinal and biological substance   . History of mammogram 2009  . Diverticulosis   . GERD (gastroesophageal reflux disease)   . Hyperlipidemia   . Atrial fibrillation     Past Surgical History  Procedure Date  . Abdominal hysterectomy   . Breast biopsy     left  . Cardiac electrophysiology mapping and ablation     Family History  Problem Relation Age of Onset  . Diabetes Mother   . COPD Father   . Heart disease Maternal Aunt   . Heart disease Maternal Uncle      History  Substance Use Topics  . Smoking status: Never Smoker   . Smokeless tobacco: Never Used  . Alcohol Use: No    OB History    Grav Para Term Preterm Abortions TAB SAB Ect Mult Living                  Review of Systems  Constitutional: Negative.  Negative for fever and fatigue.  HENT: Negative.   Eyes: Negative.   Respiratory: Negative.  Negative for chest tightness and shortness of breath.   Cardiovascular: Negative.  Negative for chest pain and leg swelling.  Gastrointestinal: Negative.  Negative for nausea, vomiting, abdominal pain and blood in stool.  Genitourinary: Negative.   Musculoskeletal: Negative.  Negative for arthralgias.  Neurological: Negative.  Negative for dizziness and light-headedness.  Hematological: Negative.   Psychiatric/Behavioral: Negative.   All other systems reviewed and are negative.    Physical Exam  BP 97/55  Pulse 78  Temp(Src) 99.9 F (37.7 C) (Oral)  Resp 16  Ht 5' (1.524 m)  Wt 128 lb (58.06 kg)  BMI 25.00 kg/m2  SpO2 93%  Physical Exam  Nursing note and vitals reviewed. Constitutional: She is oriented to person, place, and time. She appears well-developed.  HENT:  Head: Atraumatic.  Mouth/Throat:  Oropharynx is clear and moist.  Eyes: Conjunctivae and EOM are normal. Pupils are equal, round, and reactive to light.  Neck: Normal range of motion. Neck supple.  Cardiovascular: Normal rate, regular rhythm, normal heart sounds and intact distal pulses.   Pulmonary/Chest: Effort normal and breath sounds normal. No respiratory distress. She has no wheezes. She has no rales. She exhibits no tenderness.  Abdominal: Soft. She exhibits no distension. There is no tenderness. There is no rebound and no guarding.       No groin LAD or pain, no abdominal pain  Musculoskeletal: Normal range of motion.       Min pain with ROM Rt hip. Pelvis stable. Distal pulses intact  Neurological: She is alert and oriented to person, place, and  time.  Skin: Skin is warm and dry. No rash noted.  Psychiatric: She has a normal mood and affect.   Labs Reviewed  DIFFERENTIAL - Abnormal; Notable for the following:    Neutrophils Relative 89 (*)    Neutro Abs 9.1 (*)    Lymphocytes Relative 5 (*)    Lymphs Abs 0.5 (*)    All other components within normal limits  COMPREHENSIVE METABOLIC PANEL - Abnormal; Notable for the following:    Glucose, Bld 140 (*)    Alkaline Phosphatase 17 (*)    GFR calc non Af Amer 47 (*)    GFR calc Af Amer 57 (*)    All other components within normal limits  PROTIME-INR - Abnormal; Notable for the following:    Prothrombin Time 25.2 (*)    INR 2.24 (*)    All other components within normal limits  CBC    Date: 08/23/2010  Rate: 81  Rhythm: normal sinus rhythm  QRS Axis: normal  Intervals: normal  ST/T Wave abnormalities: nonspecific st and t wave abl  Conduction Disutrbances:none  Narrative Interpretation:   Old EKG Reviewed: unchanged   ED Course  Procedures  MDM 86yoF with persistent Rt hip pain and nausea likely 2/2 narcotic pain medication. CT hip here negative for occult fracture +OA. I do not suspect appendicitis or other intrabdominal or pelvic infection. Ambulatory with steady gait. Will change to percocet-- pt needs to avoid nsaids 2/2 coumadin use. To take with food. F/u with her PMD   Stefano Gaul, MD    Forbes Cellar, MD 08/23/10 1323  Forbes Cellar, MD 08/23/10 [phone]

## 2010-08-23 NOTE — ED Notes (Signed)
Per EMS, pt from home. Pt c/o nausea since this morning. Pt currently taking hydrocodone for back pain. Pt received Zofran 4mg  IV en route via EMS. Pt has 18G Left FA. Vitals WNL per EMS.

## 2010-08-29 ENCOUNTER — Encounter: Payer: Self-pay | Admitting: Family Medicine

## 2010-08-29 ENCOUNTER — Ambulatory Visit (INDEPENDENT_AMBULATORY_CARE_PROVIDER_SITE_OTHER): Payer: Medicare Other | Admitting: Family Medicine

## 2010-08-29 VITALS — BP 144/70 | HR 71 | Temp 98.3°F | Wt 126.0 lb

## 2010-08-29 DIAGNOSIS — R7309 Other abnormal glucose: Secondary | ICD-10-CM

## 2010-08-29 DIAGNOSIS — K219 Gastro-esophageal reflux disease without esophagitis: Secondary | ICD-10-CM

## 2010-08-29 DIAGNOSIS — M1610 Unilateral primary osteoarthritis, unspecified hip: Secondary | ICD-10-CM

## 2010-08-29 DIAGNOSIS — R739 Hyperglycemia, unspecified: Secondary | ICD-10-CM

## 2010-08-29 DIAGNOSIS — M461 Sacroiliitis, not elsewhere classified: Secondary | ICD-10-CM

## 2010-08-29 MED ORDER — TRAMADOL HCL 50 MG PO TABS: ORAL_TABLET | ORAL | Status: DC

## 2010-08-29 MED ORDER — METHYLPREDNISOLONE ACETATE 80 MG/ML IJ SUSP
160.0000 mg | Freq: Once | INTRAMUSCULAR | Status: AC
  Administered 2010-08-29: 160 mg via INTRAMUSCULAR

## 2010-08-29 MED ORDER — ESOMEPRAZOLE MAGNESIUM 40 MG PO CPDR: DELAYED_RELEASE_CAPSULE | ORAL | Status: DC

## 2010-08-29 MED ORDER — ALPRAZOLAM 0.25 MG PO TABS: 0.2500 mg | ORAL_TABLET | Freq: Three times a day (TID) | ORAL | Status: DC

## 2010-08-29 NOTE — Patient Instructions (Addendum)
Have inflammation of the sacroiliac joint on the right side as well as inflammation of the right hip and I am givings an injection of Depo-Medrol for inflammation as well as a oral medication of tramadol which she can take one or 2 4 times daily for the inflammation and pain Your blood sugar was elevated at the hospital and I am going to check a blood test called hemoglobin A1c to see if it's elevated more than just that one time Your cough and your problem with the esophagus and reflux I feel that she should increase her Nexium to 1 twice daily I have sent in a new prescription Call me in one week if you're not better call me anyway,Alishas ext # is [phone]

## 2010-08-30 NOTE — Progress Notes (Signed)
  Subjective:    Patient ID: [name] [name], female    DOB: Mar 08, 1924, 77 y.o.   MRN: [id number] This 77 year old white female is in relating history of having had pain in her back and he'll rather severe went to the emergency room on 2 different nights no treatment and no improvement. In today for evaluation and treatment in addition to the back pain she complains of cough hacking in nature especially after eating She is on warfarin and has been unable to take NSAIDs eared also she has a problem with dysphasia and reflux and has been on Nexium 40 mg daily chronic anxiety on alprazolam oh 0.25 mg 3 times a day Heart neck problem on alteplase low-pressure postmenopausal syndrome symptoms are relieved with Estrace, has chronic mild-to-moderate anxiety control with alprazolam HPI    Review of Systems history of present illness     Objective:   Physical Exam the patient is a well-built healthy appearance white female appearing younger than her given age of 77 and in no distress but complains of pain of her back and hip Are lungs and chest are clear lungs clear palpation percussion and auscultation no rales are heard no wheezing HEENT. reveals no postnasal drainage Examination of the spine and back reveals marked tenderness over the right SI joint as well as sometimes of the left SI joint are also tenderness over the right hip joint Limitation straight leg raising         Assessment & Plan:  Sacroiliac inflammation right side primarily with arthritis of the right heel to treat with Depo-Medrol 120 mg IM as well as tramadol 50 mg one or 2 4 times a day for arthritis and pain Call for secondary to reflex increase Nexium 40 mg twice a day Chronic anxiety refill her alprazolam oh 0.25 mg tabs Coronary artery disease continue regular medications include Coumadin

## 2010-09-01 ENCOUNTER — Other Ambulatory Visit: Payer: Self-pay

## 2010-09-01 ENCOUNTER — Ambulatory Visit (INDEPENDENT_AMBULATORY_CARE_PROVIDER_SITE_OTHER): Payer: Medicare Other | Admitting: *Deleted

## 2010-09-01 DIAGNOSIS — I4891 Unspecified atrial fibrillation: Secondary | ICD-10-CM

## 2010-09-01 LAB — POCT INR: INR: 2.6

## 2010-09-01 NOTE — Telephone Encounter (Signed)
Error

## 2010-09-06 ENCOUNTER — Telehealth: Payer: Self-pay

## 2010-09-06 NOTE — Telephone Encounter (Signed)
Treatment good

## 2010-09-06 NOTE — Telephone Encounter (Signed)
Pt called and states she is feeling better and is walking without a cane.  Pt states that the pain pills have been helping.

## 2010-09-07 ENCOUNTER — Encounter: Payer: Self-pay | Admitting: Internal Medicine

## 2010-09-07 ENCOUNTER — Ambulatory Visit (INDEPENDENT_AMBULATORY_CARE_PROVIDER_SITE_OTHER): Payer: Medicare Other | Admitting: Internal Medicine

## 2010-09-07 DIAGNOSIS — H109 Unspecified conjunctivitis: Secondary | ICD-10-CM

## 2010-09-07 DIAGNOSIS — J069 Acute upper respiratory infection, unspecified: Secondary | ICD-10-CM | POA: Insufficient documentation

## 2010-09-07 MED ORDER — OFLOXACIN 0.3 % OP SOLN: 2.0000 [drp] | Freq: Four times a day (QID) | OPHTHALMIC | Status: AC

## 2010-09-07 NOTE — Progress Notes (Signed)
Subjective:    Patient ID: [name] [name], female    DOB: 15-Sep-1924, 77 y.o.   MRN: [id number]  Conjunctivitis  The current episode started yesterday. The problem has been gradually worsening. The problem is mild. The symptoms are relieved by nothing. Associated symptoms include congestion, cough, URI and eye discharge. Pertinent negatives include no fever, no photophobia, no vomiting, no ear pain and no eye pain.  URI  This is a new problem. The current episode started yesterday. There has been no fever. Associated symptoms include congestion and coughing. Pertinent negatives include no ear pain or vomiting. She has tried nothing for the symptoms.   Daughter and grandchild have similar symptoms (cold).  Review of Systems  Constitutional: Negative for fever.  HENT: Positive for congestion. Negative for ear pain.   Eyes: Positive for discharge. Negative for photophobia and pain.  Respiratory: Positive for cough.   Gastrointestinal: Negative for vomiting.       Past Medical History  Diagnosis Date  . Paroxysmal a-fib   . Mitral and aortic regurgitation   . Mitral valve prolapse     hx of  . Hypertension   . Hyperlipidemia   . Vitamin D deficiency   . Pneumonia   . Tracheitis   . Gait difficulty   . Anemia   . Melena   . IBS (irritable bowel syndrome)   . Incontinence   . GERD (gastroesophageal reflux disease)   . Osteopenia   . Thyroid disease     hypo  . Personal history of colonic polyps   . Current use of long term anticoagulation   . Cough   . Diarrhea   . Pneumonia     organism unspecified  . Constipation   . UTI (lower urinary tract infection)   . Unspecified adverse effect of other drug, medicinal and biological substance   . History of mammogram 2009  . Diverticulosis   . GERD (gastroesophageal reflux disease)   . Hyperlipidemia   . Atrial fibrillation     History   Social History  . Marital Status: Widowed    Spouse Name: N/A    Number of Children:  N/A  . Years of Education: N/A   Occupational History  . Not on file.   Social History Main Topics  . Smoking status: Never Smoker   . Smokeless tobacco: Never Used  . Alcohol Use: No  . Drug Use: No  . Sexually Active: No   Other Topics Concern  . Not on file   Social History Narrative  . No narrative on file    Past Surgical History  Procedure Date  . Abdominal hysterectomy   . Breast biopsy     left  . Cardiac electrophysiology mapping and ablation     Family History  Problem Relation Age of Onset  . Diabetes Mother   . COPD Father   . Heart disease Maternal Aunt   . Heart disease Maternal Uncle     Allergies  Allergen Reactions  . Darifenacin Hydrobromide     REACTION: causes her dizziness  . Sulfonamide Derivatives     Current Outpatient Prescriptions on File Prior to Visit  Medication Sig Dispense Refill  . ALPRAZolam (XANAX) 0.25 MG tablet Take 1 tablet (0.25 mg total) by mouth 3 (three) times daily. For stress  90 tablet  5  . atorvastatin (LIPITOR) 20 MG tablet Take 1 tablet (20 mg total) by mouth daily.  30 tablet  11  . esomeprazole (NEXIUM) 40 MG  capsule 1 cap AM and PM for reflux and cough  60 capsule  11  . estradiol (ESTRACE) 0.5 MG tablet Take 1 tablet (0.5 mg total) by mouth daily.  30 tablet  11  . hydrochlorothiazide 25 MG tablet Take 1 tablet (25 mg total) by mouth daily.  30 tablet  11  . metoprolol (LOPRESSOR) 25 MG tablet Take 1 tablet (25 mg total) by mouth 2 (two) times daily. bid  60 tablet  11  . ramipril (ALTACE) 10 MG capsule Take 1 capsule (10 mg total) by mouth daily.  30 capsule  11  . traMADol (ULTRAM) 50 MG tablet 1-2 tabs qid prn for back and hip pain  100 tablet  11  . warfarin (COUMADIN) 2.5 MG tablet Take by mouth as directed.          BP 142/72  Temp(Src) 98 F (36.7 C) (Oral)  Wt 123 lb (55.792 kg)    Objective:   Physical Exam   Constitutional: Appears well-developed and well-nourished. No distress.  Head:  Normocephalic and atraumatic.  Right Ear: External ear normal.  Left Ear: External ear normal.  Mouth/Throat: Oropharynx is clear and moist.  Eyes: right conjunctiva is red.  Normal pupillary response.  Minimal eye discharge Cardiovascular: Normal rate, regular rhythm and normal heart sounds.  Exam reveals no gallop and no friction rub.   No murmur heard. Pulmonary/Chest: Effort normal and breath sounds normal.  No wheezes. No rales.       Assessment & Plan:

## 2010-09-07 NOTE — Assessment & Plan Note (Signed)
Likely viral URI.  Lungs are clear.  Symptomatic tx.  Patient advised to call office if symptoms persist or worsen.

## 2010-09-07 NOTE — Assessment & Plan Note (Signed)
Right eye conjunctivitis.  Treat with ofloxacin and otc saline gtts.  She denies eye pain or visual change.  She has normal pupillary response. Patient advised to call office if symptoms persist or worsen.

## 2010-09-07 NOTE — Patient Instructions (Signed)
Also use saline eye drops to flush your eyes 3 x per day Please call our office if your symptoms do not improve or gets worse.

## 2010-09-15 ENCOUNTER — Ambulatory Visit (INDEPENDENT_AMBULATORY_CARE_PROVIDER_SITE_OTHER): Payer: Medicare Other | Admitting: Cardiology

## 2010-09-15 ENCOUNTER — Encounter: Payer: Self-pay | Admitting: Cardiology

## 2010-09-15 VITALS — BP 120/58 | HR 54 | Ht 60.0 in | Wt 120.4 lb

## 2010-09-15 DIAGNOSIS — Z8679 Personal history of other diseases of the circulatory system: Secondary | ICD-10-CM

## 2010-09-15 DIAGNOSIS — I4891 Unspecified atrial fibrillation: Secondary | ICD-10-CM

## 2010-09-15 DIAGNOSIS — E785 Hyperlipidemia, unspecified: Secondary | ICD-10-CM

## 2010-09-15 DIAGNOSIS — I080 Rheumatic disorders of both mitral and aortic valves: Secondary | ICD-10-CM

## 2010-09-15 MED ORDER — ATORVASTATIN CALCIUM 10 MG PO TABS: 10.0000 mg | ORAL_TABLET | Freq: Every day | ORAL | Status: DC

## 2010-09-15 NOTE — Assessment & Plan Note (Signed)
Stable. Continue current medical therapy including anticoagulation.

## 2010-09-15 NOTE — Assessment & Plan Note (Signed)
Complains of intermittent leg soreness and tenderness. LDL way below goal. Increase atorvastatin to 10 mg per day. Follow up blood work in 3 months.

## 2010-09-15 NOTE — Progress Notes (Signed)
HPI [name] [name] comes in today for evaluation and management of her paroxysmal A. Fib, anticoagulation, history of mitral valve prolapse mild mitral regurgitation, as well as her hyperlipidemia.  She continues to be quite independent. She lives with her daughter. She still drives. She's had no falls. She is very compliant with her meds.  Her only complaint today is soreness in her legs and some tenderness to her calves at times. It is not made worse by activity.  Her last LDL was in the 30s. She is on atorvastatin 20 mg q.h.s.  EKG from August 22 shows normal sinus rhythm with no changes. She has nonspecific ST segment changes. Past Medical History  Diagnosis Date  . Paroxysmal a-fib   . Mitral and aortic regurgitation   . Mitral valve prolapse     hx of  . Hypertension   . Hyperlipidemia   . Vitamin D deficiency   . Pneumonia   . Tracheitis   . Gait difficulty   . Anemia   . Melena   . IBS (irritable bowel syndrome)   . Incontinence   . GERD (gastroesophageal reflux disease)   . Osteopenia   . Thyroid disease     hypo  . Personal history of colonic polyps   . Current use of long term anticoagulation   . Cough   . Diarrhea   . Pneumonia     organism unspecified  . Constipation   . UTI (lower urinary tract infection)   . Unspecified adverse effect of other drug, medicinal and biological substance   . History of mammogram 2009  . Diverticulosis   . GERD (gastroesophageal reflux disease)   . Hyperlipidemia   . Atrial fibrillation     Past Surgical History  Procedure Date  . Abdominal hysterectomy   . Breast biopsy     left  . Cardiac electrophysiology mapping and ablation     Family History  Problem Relation Age of Onset  . Diabetes Mother   . COPD Father   . Heart disease Maternal Aunt   . Heart disease Maternal Uncle     History   Social History  . Marital Status: Widowed    Spouse Name: N/A    Number of Children: N/A  . Years of Education: N/A    Occupational History  . Not on file.   Social History Main Topics  . Smoking status: Never Smoker   . Smokeless tobacco: Never Used  . Alcohol Use: No  . Drug Use: No  . Sexually Active: No   Other Topics Concern  . Not on file   Social History Narrative  . No narrative on file    Allergies  Allergen Reactions  . Darifenacin Hydrobromide     REACTION: causes her dizziness  . Sulfonamide Derivatives     Current Outpatient Prescriptions  Medication Sig Dispense Refill  . ALPRAZolam (XANAX) 0.25 MG tablet Take 1 tablet (0.25 mg total) by mouth 3 (three) times daily. For stress  90 tablet  5  . atorvastatin (LIPITOR) 20 MG tablet Take 1 tablet (20 mg total) by mouth daily.  30 tablet  11  . esomeprazole (NEXIUM) 40 MG capsule 1 cap AM and PM for reflux and cough  60 capsule  11  . estradiol (ESTRACE) 0.5 MG tablet Take 1 tablet (0.5 mg total) by mouth daily.  30 tablet  11  . hydrochlorothiazide 25 MG tablet Take 1 tablet (25 mg total) by mouth daily.  30 tablet  11  .  metoprolol (LOPRESSOR) 25 MG tablet Take 1 tablet (25 mg total) by mouth 2 (two) times daily. bid  60 tablet  11  . ramipril (ALTACE) 10 MG capsule Take 1 capsule (10 mg total) by mouth daily.  30 capsule  11  . traMADol (ULTRAM) 50 MG tablet 1-2 tabs qid prn for back and hip pain  100 tablet  11  . warfarin (COUMADIN) 2.5 MG tablet Take by mouth as directed.        Marland Kitchen ofloxacin (OCUFLOX) 0.3 % ophthalmic solution Place 2 drops into the right eye 4 (four) times daily.  10 mL  0    ROS Negative other than HPI.   PE General Appearance: well developed, well nourished in no acute distress HEENT: symmetrical face, PERRLA, good dentition  Neck: no JVD, thyromegaly, or adenopathy, trachea midline Chest: symmetric without deformity Cardiac: PMI non-displaced, RRR, normal S1, S2, no gallop or murmur Lung: clear to ausculation and percussion Vascular: all pulses full without bruits  Abdominal: nondistended,  nontender, good bowel sounds, no HSM, no bruits Extremities: no cyanosis, clubbing or edema, no sign of DVT, no varicosities  Skin: normal color, no rashes Neuro: alert and oriented x 3, non-focal Pysch: normal affect Filed Vitals:   09/15/10 0948  BP: 120/58  Pulse: 54  Height: 5' (1.524 m)  Weight: 120 lb 6.4 oz (54.613 kg)    EKG  Labs and Studies Reviewed.   Lab Results  Component Value Date   WBC 10.2 08/23/2010   HGB 12.5 08/23/2010   HCT 36.5 08/23/2010   MCV 91.3 08/23/2010   PLT 159 08/23/2010      Chemistry      Component Value Date/Time   NA 141 08/23/2010 1005   K 4.0 08/23/2010 1005   CL 105 08/23/2010 1005   CO2 26 08/23/2010 1005   BUN 22 08/23/2010 1005   CREATININE 1.10 08/23/2010 1005      Component Value Date/Time   CALCIUM 9.3 08/23/2010 1005   ALKPHOS 17* 08/23/2010 1005   AST 19 08/23/2010 1005   ALT 15 08/23/2010 1005   BILITOT 0.4 08/23/2010 1005       Lab Results  Component Value Date   CHOL 109 02/23/2010   CHOL 142 01/19/2009   CHOL 153 01/15/2008   Lab Results  Component Value Date   HDL 30.00* 02/23/2010   HDL 35.20* 01/19/2009   HDL 37.9* 01/15/2008   Lab Results  Component Value Date   LDLCALC 46 02/23/2010   Lab Results  Component Value Date   TRIG 163.0* 02/23/2010   TRIG 243.0* 01/19/2009   TRIG 204* 01/15/2008   Lab Results  Component Value Date   CHOLHDL 4 02/23/2010   CHOLHDL 4 01/19/2009   CHOLHDL 4.0 CALC 01/15/2008   Lab Results  Component Value Date   HGBA1C 6.2 08/29/2010   Lab Results  Component Value Date   ALT 15 08/23/2010   AST 19 08/23/2010   ALKPHOS 17* 08/23/2010   BILITOT 0.4 08/23/2010   Lab Results  Component Value Date   TSH 2.21 02/23/2010

## 2010-09-15 NOTE — Patient Instructions (Signed)
Your physician has recommended you make the following change in your medication:  decrease Atorvastatin and repeat fasting cholesterol labs in 3 months.   Your physician recommends that you return for lab work in: 3 months for fasting cholesterol and liver enzymes.  We will call you with your lab results.  Your physician recommends that you schedule a follow-up appointment in: 1year with Dr. Daleen Squibb

## 2010-09-15 NOTE — Assessment & Plan Note (Signed)
Stable clinically. Continue medical therapy.  No indication for imaging.

## 2010-09-25 LAB — CBC
Platelets: 188
RBC: 4.26
WBC: 12.7 — ABNORMAL HIGH

## 2010-09-25 LAB — URINE MICROSCOPIC-ADD ON

## 2010-09-25 LAB — URINALYSIS, ROUTINE W REFLEX MICROSCOPIC
Glucose, UA: NEGATIVE
Protein, ur: NEGATIVE
Specific Gravity, Urine: 1.012
Urobilinogen, UA: 0.2

## 2010-09-25 LAB — COMPREHENSIVE METABOLIC PANEL
ALT: 20
AST: 26
Albumin: 3.7
Alkaline Phosphatase: 19 — ABNORMAL LOW
CO2: 24
Chloride: 99
GFR calc Af Amer: 45 — ABNORMAL LOW
Potassium: 3.8
Total Bilirubin: 0.8

## 2010-09-25 LAB — DIFFERENTIAL
Basophils Absolute: 0
Basophils Relative: 0
Eosinophils Absolute: 0
Eosinophils Relative: 0
Monocytes Absolute: 0.5

## 2010-09-29 ENCOUNTER — Ambulatory Visit (INDEPENDENT_AMBULATORY_CARE_PROVIDER_SITE_OTHER): Payer: Medicare Other | Admitting: *Deleted

## 2010-09-29 DIAGNOSIS — I4891 Unspecified atrial fibrillation: Secondary | ICD-10-CM

## 2010-10-06 ENCOUNTER — Telehealth: Payer: Self-pay

## 2010-10-06 NOTE — Telephone Encounter (Signed)
Pt called and stated she needed a return call about the her medication for pain in her hip.    Called pt and she stated she had stopped the medication for pain in her hip because she is not having it as much.  Pt states she only has it at night when she is in the bed.  Pt aware to call back if she does experience pain in her hip again.

## 2010-10-12 ENCOUNTER — Encounter: Payer: Self-pay | Admitting: Family Medicine

## 2010-10-12 ENCOUNTER — Ambulatory Visit (INDEPENDENT_AMBULATORY_CARE_PROVIDER_SITE_OTHER): Payer: Medicare Other | Admitting: Family Medicine

## 2010-10-12 VITALS — BP 140/60 | Temp 98.0°F | Wt 124.0 lb

## 2010-10-12 DIAGNOSIS — R05 Cough: Secondary | ICD-10-CM

## 2010-10-12 DIAGNOSIS — J309 Allergic rhinitis, unspecified: Secondary | ICD-10-CM

## 2010-10-12 NOTE — Patient Instructions (Signed)
Try over the counter Allegra (fexofenadine) one daily for allergy symptoms Follow up promptly for any fever or if cough persists.

## 2010-10-12 NOTE — Progress Notes (Signed)
  Subjective:    Patient ID: [name] [name], female    DOB: [birth year], 77 y.o.   MRN: [id number]  HPI 1 week history of cough. Occasionally productive clear mucus. Frequent sneezing and clear postnasal drip symptoms. Patient also has some symptoms eye itching. Denies any fever. No dyspnea. Has not taken anything for cough. Denies any headaches or any facial pain or purulent nasal secretions.  No history of smoking.  Past Medical History  Diagnosis Date  . Paroxysmal a-fib   . Mitral and aortic regurgitation   . Mitral valve prolapse     hx of  . Hypertension   . Hyperlipidemia   . Vitamin D deficiency   . Pneumonia   . Tracheitis   . Gait difficulty   . Anemia   . Melena   . IBS (irritable bowel syndrome)   . Incontinence   . GERD (gastroesophageal reflux disease)   . Osteopenia   . Thyroid disease     hypo  . Personal history of colonic polyps   . Current use of long term anticoagulation   . Cough   . Diarrhea   . Pneumonia     organism unspecified  . Constipation   . UTI (lower urinary tract infection)   . Unspecified adverse effect of other drug, medicinal and biological substance   . History of mammogram 2009  . Diverticulosis   . GERD (gastroesophageal reflux disease)   . Hyperlipidemia   . Atrial fibrillation    Past Surgical History  Procedure Date  . Abdominal hysterectomy   . Breast biopsy     left  . Cardiac electrophysiology mapping and ablation     reports that she has never smoked. She has never used smokeless tobacco. She reports that she does not drink alcohol or use illicit drugs. family history includes COPD in her father; Diabetes in her mother; and Heart disease in her maternal aunt and maternal uncle. Allergies  Allergen Reactions  . Darifenacin Hydrobromide     REACTION: causes her dizziness  . Sulfonamide Derivatives       Review of Systems  Constitutional: Negative for fever and chills.  HENT: Positive for congestion, sneezing and  postnasal drip.   Eyes: Positive for itching.  Respiratory: Positive for cough. Negative for shortness of breath and wheezing.   Cardiovascular: Negative for chest pain.       Objective:   Physical Exam  Constitutional: She appears well-developed and well-nourished.  HENT:  Right Ear: External ear normal.  Left Ear: External ear normal.  Nose: Nose normal.  Mouth/Throat: Oropharynx is clear and moist.  Neck: Neck supple.  Cardiovascular: Normal rate and regular rhythm.   Pulmonary/Chest: Effort normal and breath sounds normal. No respiratory distress. She has no wheezes. She has no rales.          Assessment & Plan:  Cough probably related to allergic postnasal drip. Plain Allegra one daily. Follow up promptly for any fever or persistent cough

## 2010-10-27 ENCOUNTER — Ambulatory Visit (INDEPENDENT_AMBULATORY_CARE_PROVIDER_SITE_OTHER): Payer: Medicare Other | Admitting: *Deleted

## 2010-10-27 DIAGNOSIS — Z7901 Long term (current) use of anticoagulants: Secondary | ICD-10-CM

## 2010-10-27 DIAGNOSIS — I4891 Unspecified atrial fibrillation: Secondary | ICD-10-CM

## 2010-11-13 ENCOUNTER — Ambulatory Visit (INDEPENDENT_AMBULATORY_CARE_PROVIDER_SITE_OTHER): Payer: Medicare Other | Admitting: Family Medicine

## 2010-11-13 ENCOUNTER — Encounter: Payer: Self-pay | Admitting: Family Medicine

## 2010-11-13 VITALS — BP 146/78 | HR 59 | Temp 97.6°F | Ht 59.25 in | Wt 124.8 lb

## 2010-11-13 DIAGNOSIS — M129 Arthropathy, unspecified: Secondary | ICD-10-CM

## 2010-11-13 DIAGNOSIS — D649 Anemia, unspecified: Secondary | ICD-10-CM

## 2010-11-13 DIAGNOSIS — M1990 Unspecified osteoarthritis, unspecified site: Secondary | ICD-10-CM

## 2010-11-13 DIAGNOSIS — F411 Generalized anxiety disorder: Secondary | ICD-10-CM

## 2010-11-13 DIAGNOSIS — I10 Essential (primary) hypertension: Secondary | ICD-10-CM

## 2010-11-13 DIAGNOSIS — K222 Esophageal obstruction: Secondary | ICD-10-CM

## 2010-11-13 MED ORDER — ACETAMINOPHEN 500 MG PO TABS: 500.0000 mg | ORAL_TABLET | Freq: Three times a day (TID) | ORAL | Status: AC | PRN

## 2010-11-13 NOTE — Assessment & Plan Note (Signed)
Patient reports she has taken Alprazolam 3 x daily for quite some time. May continue to have this but no more, she often skips her day time dose and takes two qhs to sleep. She notes that it is not working as well as it used to. She may consider trying to take them all at once qhs and skipping her day time dose.

## 2010-11-13 NOTE — Assessment & Plan Note (Signed)
B/l knee pain noted with history of arthritic changes. Use Tylenol prn as directed and notify us if worsens

## 2010-11-13 NOTE — Assessment & Plan Note (Addendum)
Mild elevation today despite having taken her meds today, will reevaluate at next visit

## 2010-11-13 NOTE — Patient Instructions (Signed)

## 2010-11-13 NOTE — Assessment & Plan Note (Signed)
Had botox injections for the strictures with good results, as long as she eats slowly and drinks behind each bite she is doing well.

## 2010-11-13 NOTE — Assessment & Plan Note (Signed)
Will bring her in for annual labs in 3 months, most recnet CBC acceptable

## 2010-11-17 ENCOUNTER — Ambulatory Visit (INDEPENDENT_AMBULATORY_CARE_PROVIDER_SITE_OTHER): Payer: Medicare Other | Admitting: *Deleted

## 2010-11-17 DIAGNOSIS — Z7901 Long term (current) use of anticoagulants: Secondary | ICD-10-CM

## 2010-11-17 DIAGNOSIS — I4891 Unspecified atrial fibrillation: Secondary | ICD-10-CM

## 2010-11-27 ENCOUNTER — Encounter: Payer: Self-pay | Admitting: Family Medicine

## 2010-11-27 NOTE — Progress Notes (Signed)
[name] [name] [id number] [birth year] 11/27/2010      Progress Note New Patient  Subjective  Chief Complaint  Chief Complaint  Patient presents with  . Establish Care    new patient- transfer from Dr Scotty Court    HPI  Patient is an 77 year old Caucasian female in today to establish care. Overall she is in good health but her primary care doctor has retired and she is in need of ongoing care. She has chronic orthopedic complaints including bilateral hip pain and bilateral knee pain. She has had steroid injections in the past with some good relief and at present manages her discomfort with staying active and Tylenol. She denies any recent acute or febrile illness. Has long history of vertebral belt type symptoms with intermittent constipation and diarrhea. Has a lot of trouble Esophageal strictures as well and follows with gastroenterology. No recent chest pain, palpitations, shortness of breath, GI or GU complaints are otherwise noted today. Had her flu shot roughly 2 weeks ago.  Past Medical History  Diagnosis Date  . Paroxysmal a-fib   . Mitral and aortic regurgitation   . Mitral valve prolapse     hx of  . Hypertension   . Hyperlipidemia   . Vitamin D deficiency   . Pneumonia   . Tracheitis   . Gait difficulty   . Anemia   . Melena   . IBS (irritable bowel syndrome)   . Incontinence   . GERD (gastroesophageal reflux disease)   . Osteopenia   . Thyroid disease     hypo  . Personal history of colonic polyps   . Current use of long term anticoagulation   . Cough   . Diarrhea   . Pneumonia     organism unspecified  . Constipation   . UTI (lower urinary tract infection)   . Unspecified adverse effect of other drug, medicinal and biological substance   . History of mammogram 2009  . Diverticulosis   . GERD (gastroesophageal reflux disease)   . Hyperlipidemia   . Atrial fibrillation   . Arthritis     hips, knees    Past Surgical History  Procedure Date  . Breast  biopsy     left  . Cardiac electrophysiology mapping and ablation   . Abdominal hysterectomy     partial, ovaries left in place  . Cataract extraction, bilateral   . Esophagoscopy w/ botox injection     Family History  Problem Relation Age of Onset  . Diabetes Mother   . Stroke Mother   . COPD Father   . Heart disease Maternal Aunt   . Heart disease Maternal Uncle   . Allergies Daughter   . COPD Daughter   . Stroke Daughter   . COPD Daughter     previous smoker  . Stroke Maternal Grandfather     History   Social History  . Marital Status: Widowed    Spouse Name: N/A    Number of Children: N/A  . Years of Education: N/A   Occupational History  . Not on file.   Social History Main Topics  . Smoking status: Never Smoker   . Smokeless tobacco: Never Used  . Alcohol Use: No  . Drug Use: No  . Sexually Active: No   Other Topics Concern  . Not on file   Social History Narrative  . No narrative on file    Current Outpatient Prescriptions on File Prior to Visit  Medication Sig Dispense Refill  . ALPRAZolam (  XANAX) 0.25 MG tablet Take 1 tablet (0.25 mg total) by mouth 3 (three) times daily. For stress  90 tablet  5  . atorvastatin (LIPITOR) 10 MG tablet Take 1 tablet (10 mg total) by mouth daily.  30 tablet  11  . esomeprazole (NEXIUM) 40 MG capsule 1 cap AM and PM for reflux and cough  60 capsule  11  . estradiol (ESTRACE) 0.5 MG tablet Take 1 tablet (0.5 mg total) by mouth daily.  30 tablet  11  . hydrochlorothiazide 25 MG tablet Take 1 tablet (25 mg total) by mouth daily.  30 tablet  11  . metoprolol (LOPRESSOR) 25 MG tablet Take 1 tablet (25 mg total) by mouth 2 (two) times daily. bid  60 tablet  11  . ramipril (ALTACE) 10 MG capsule Take 1 capsule (10 mg total) by mouth daily.  30 capsule  11  . warfarin (COUMADIN) 2.5 MG tablet Take by mouth as directed.          Allergies  Allergen Reactions  . Darifenacin Hydrobromide     REACTION: causes her dizziness    . Sulfonamide Derivatives   . Tramadol Other (See Comments)    Insomnia, anorexia    Review of Systems  Review of Systems  Constitutional: Negative for fever, chills and malaise/fatigue.  HENT: Negative for hearing loss, nosebleeds and congestion.   Eyes: Negative for discharge.  Respiratory: Negative for cough, sputum production, shortness of breath and wheezing.   Cardiovascular: Negative for chest pain, palpitations and leg swelling.  Gastrointestinal: Positive for heartburn, abdominal pain and diarrhea. Negative for nausea, vomiting, constipation and blood in stool.  Genitourinary: Negative for dysuria, urgency, frequency and hematuria.  Musculoskeletal: Positive for joint pain. Negative for myalgias, back pain and falls.  Skin: Negative for rash.  Neurological: Negative for dizziness, tremors, sensory change, focal weakness, loss of consciousness, weakness and headaches.  Endo/Heme/Allergies: Negative for polydipsia. Does not bruise/bleed easily.  Psychiatric/Behavioral: Negative for depression and suicidal ideas. The patient is not nervous/anxious and does not have insomnia.     Objective  BP 146/78  Pulse 59  Temp(Src) 97.6 F (36.4 C) (Oral)  Ht 4' 11.25" (1.505 m)  Wt 124 lb 12.8 oz (56.609 kg)  BMI 24.99 kg/m2  SpO2 96%  Physical Exam  Physical Exam  Constitutional: She is oriented to person, place, and time and well-developed, well-nourished, and in no distress. No distress.  HENT:  Head: Normocephalic and atraumatic.  Right Ear: External ear normal.  Left Ear: External ear normal.  Nose: Nose normal.  Mouth/Throat: Oropharynx is clear and moist. No oropharyngeal exudate.  Eyes: Conjunctivae are normal. Pupils are equal, round, and reactive to light. Right eye exhibits no discharge. Left eye exhibits no discharge. No scleral icterus.  Neck: Normal range of motion. Neck supple. No thyromegaly present.  Cardiovascular: Normal rate, regular rhythm and intact  distal pulses.   Murmur heard. Pulmonary/Chest: Effort normal and breath sounds normal. No respiratory distress. She has no wheezes. She has no rales.  Abdominal: Soft. Bowel sounds are normal. She exhibits no distension and no mass. There is no tenderness.  Musculoskeletal: Normal range of motion. She exhibits no edema and no tenderness.  Lymphadenopathy:    She has no cervical adenopathy.  Neurological: She is alert and oriented to person, place, and time. She has normal reflexes. No cranial nerve deficit. Coordination normal.  Skin: Skin is warm and dry. No rash noted. She is not diaphoretic.  Psychiatric: Mood, memory and  affect normal.       Assessment & Plan  Arthritis B/l knee pain noted with history of arthritic changes. Use Tylenol prn as directed and notify us if worsens  ANXIETY Patient reports she has taken Alprazolam 3 x daily for quite some time. May continue to have this but no more, she often skips her day time dose and takes two qhs to sleep. She notes that it is not working as well as it used to. She may consider trying to take them all at once qhs and skipping her day time dose.  ANEMIA Will bring her in for annual labs in 3 months, most recnet CBC acceptable  ESOPHAGEAL STRICTURE Had botox injections for the strictures with good results, as long as she eats slowly and drinks behind each bite she is doing well.   HYPERTENSION Mild elevation today despite having taken her meds today, will reevaluate at next visit

## 2010-12-08 ENCOUNTER — Ambulatory Visit (INDEPENDENT_AMBULATORY_CARE_PROVIDER_SITE_OTHER): Payer: Medicare Other | Admitting: *Deleted

## 2010-12-08 DIAGNOSIS — I4891 Unspecified atrial fibrillation: Secondary | ICD-10-CM

## 2010-12-08 DIAGNOSIS — Z7901 Long term (current) use of anticoagulants: Secondary | ICD-10-CM

## 2010-12-11 ENCOUNTER — Other Ambulatory Visit: Payer: Self-pay | Admitting: Family Medicine

## 2010-12-11 NOTE — Telephone Encounter (Signed)
Please advise early refill? 

## 2010-12-11 NOTE — Telephone Encounter (Signed)
OK to send estradiol with same dose, same sig, disp #30 with 5 rf please

## 2010-12-12 MED ORDER — ESTRADIOL 0.5 MG PO TABS: 0.5000 mg | ORAL_TABLET | Freq: Every day | ORAL | Status: DC

## 2010-12-19 ENCOUNTER — Other Ambulatory Visit (INDEPENDENT_AMBULATORY_CARE_PROVIDER_SITE_OTHER): Payer: Medicare Other | Admitting: *Deleted

## 2010-12-19 DIAGNOSIS — E785 Hyperlipidemia, unspecified: Secondary | ICD-10-CM

## 2010-12-19 LAB — HEPATIC FUNCTION PANEL
ALT: 12 U/L (ref 0–35)
Alkaline Phosphatase: 15 U/L — ABNORMAL LOW (ref 39–117)
Bilirubin, Direct: 0 mg/dL (ref 0.0–0.3)
Total Protein: 7.1 g/dL (ref 6.0–8.3)

## 2010-12-19 LAB — LIPID PANEL: Total CHOL/HDL Ratio: 4

## 2011-01-05 ENCOUNTER — Ambulatory Visit (INDEPENDENT_AMBULATORY_CARE_PROVIDER_SITE_OTHER): Payer: Medicare Other | Admitting: *Deleted

## 2011-01-05 DIAGNOSIS — I4891 Unspecified atrial fibrillation: Secondary | ICD-10-CM

## 2011-01-05 DIAGNOSIS — Z7901 Long term (current) use of anticoagulants: Secondary | ICD-10-CM

## 2011-01-05 LAB — POCT INR: INR: 1.4

## 2011-01-05 NOTE — Patient Instructions (Signed)
Patient states she may have eaten more leafy green vegetables than normal, discussed consistency and amount of vegetables, provided patient with Vitamin K food list.

## 2011-01-22 ENCOUNTER — Ambulatory Visit (INDEPENDENT_AMBULATORY_CARE_PROVIDER_SITE_OTHER): Payer: Medicare Other | Admitting: *Deleted

## 2011-01-22 DIAGNOSIS — I4891 Unspecified atrial fibrillation: Secondary | ICD-10-CM

## 2011-01-22 DIAGNOSIS — Z7901 Long term (current) use of anticoagulants: Secondary | ICD-10-CM

## 2011-01-22 LAB — POCT INR: INR: 2.1

## 2011-02-13 ENCOUNTER — Ambulatory Visit (INDEPENDENT_AMBULATORY_CARE_PROVIDER_SITE_OTHER): Payer: Medicare Other | Admitting: Family Medicine

## 2011-02-13 ENCOUNTER — Encounter: Payer: Self-pay | Admitting: Family Medicine

## 2011-02-13 DIAGNOSIS — K222 Esophageal obstruction: Secondary | ICD-10-CM

## 2011-02-13 DIAGNOSIS — F411 Generalized anxiety disorder: Secondary | ICD-10-CM

## 2011-02-13 DIAGNOSIS — K219 Gastro-esophageal reflux disease without esophagitis: Secondary | ICD-10-CM

## 2011-02-13 DIAGNOSIS — I10 Essential (primary) hypertension: Secondary | ICD-10-CM

## 2011-02-13 DIAGNOSIS — I4891 Unspecified atrial fibrillation: Secondary | ICD-10-CM

## 2011-02-13 DIAGNOSIS — E785 Hyperlipidemia, unspecified: Secondary | ICD-10-CM

## 2011-02-13 DIAGNOSIS — F419 Anxiety disorder, unspecified: Secondary | ICD-10-CM

## 2011-02-13 DIAGNOSIS — E039 Hypothyroidism, unspecified: Secondary | ICD-10-CM

## 2011-02-13 DIAGNOSIS — R350 Frequency of micturition: Secondary | ICD-10-CM

## 2011-02-13 DIAGNOSIS — E559 Vitamin D deficiency, unspecified: Secondary | ICD-10-CM

## 2011-02-13 LAB — RENAL FUNCTION PANEL
CO2: 29 mEq/L (ref 19–32)
Creatinine, Ser: 1.2 mg/dL (ref 0.4–1.2)
GFR: 44.51 mL/min — ABNORMAL LOW (ref >60.00)
Sodium: 141 mEq/L (ref 135–145)

## 2011-02-13 LAB — LIPID PANEL
Cholesterol: 142 mg/dL (ref 0–200)
HDL: 39 mg/dL — ABNORMAL LOW (ref >39.00)
VLDL: 29.2 mg/dL (ref 0.0–40.0)

## 2011-02-13 LAB — CBC
HCT: 35.8 % — ABNORMAL LOW (ref 36.0–46.0)
RDW: 14.3 % (ref 11.5–14.6)
WBC: 4.3 10*3/uL — ABNORMAL LOW (ref 4.5–10.5)

## 2011-02-13 LAB — POCT URINALYSIS DIPSTICK
Leukocytes, UA: NEGATIVE
Nitrite, UA: NEGATIVE
Protein, UA: NEGATIVE
Spec Grav, UA: 1.015
Urobilinogen, UA: 0.2

## 2011-02-13 LAB — HEPATIC FUNCTION PANEL
AST: 17 U/L (ref 0–37)
Total Bilirubin: 0.7 mg/dL (ref 0.3–1.2)

## 2011-02-13 LAB — TSH: TSH: 3.27 u[IU]/mL (ref 0.35–5.50)

## 2011-02-13 MED ORDER — HYDROCHLOROTHIAZIDE 25 MG PO TABS: 25.0000 mg | ORAL_TABLET | Freq: Every day | ORAL | Status: DC

## 2011-02-13 MED ORDER — METOPROLOL TARTRATE 25 MG PO TABS: 25.0000 mg | ORAL_TABLET | Freq: Two times a day (BID) | ORAL | Status: DC

## 2011-02-13 MED ORDER — ESTRADIOL 0.5 MG PO TABS: 0.5000 mg | ORAL_TABLET | Freq: Every day | ORAL | Status: DC

## 2011-02-13 MED ORDER — ATORVASTATIN CALCIUM 10 MG PO TABS: 10.0000 mg | ORAL_TABLET | Freq: Every day | ORAL | Status: DC

## 2011-02-13 MED ORDER — ESOMEPRAZOLE MAGNESIUM 40 MG PO CPDR: DELAYED_RELEASE_CAPSULE | ORAL | Status: DC

## 2011-02-13 MED ORDER — ALPRAZOLAM 0.25 MG PO TABS: 0.2500 mg | ORAL_TABLET | Freq: Three times a day (TID) | ORAL | Status: DC

## 2011-02-13 MED ORDER — RAMIPRIL 10 MG PO CAPS: 10.0000 mg | ORAL_CAPSULE | Freq: Every day | ORAL | Status: DC

## 2011-02-13 NOTE — Progress Notes (Signed)
Patient ID: [name] [name], female   DOB: [birth year], 78 y.o.   MRN: [id number] ALAYJAH BOEHRINGER [id number] Feb 04, 1924 02/13/2011      Progress Note-Follow Up  Subjective  Chief Complaint  Chief Complaint  Patient presents with  . Follow-up    3 month follow up    HPI   an 78 year old Caucasian female who is in today for followup. Overall she feels well. She denies any trips to the ER. She's had no recent illness, fevers, headache, congestion, chest pain, shortness of breath, GI or GU complaints. Has occasional sensation of her heart beating irregularly but has no associated symptoms. This is infrequent. She continues the Coumadin clinic. She has a history of some esophageal strictures and reflux but in general the Nexium controls her reflux. She is very frequent episodes of catching she is good about eating small bites and chewing her food slowly. She was at her daughter. She does note some recent urinary frequency and urgency but denies dysuria, hematuria or flank pain.   Past Medical History  Diagnosis Date  . Paroxysmal a-fib   . Mitral and aortic regurgitation   . Mitral valve prolapse     hx of  . Hypertension   . Hyperlipidemia   . Vitamin d deficiency   . Pneumonia   . Tracheitis   . Gait difficulty   . Anemia   . Melena   . IBS (irritable bowel syndrome)   . Incontinence   . GERD (gastroesophageal reflux disease)   . Osteopenia   . Thyroid disease     hypo  . Personal history of colonic polyps   . Current use of long term anticoagulation   . Cough   . Diarrhea   . Pneumonia     organism unspecified  . Constipation   . UTI (lower urinary tract infection)   . Unspecified adverse effect of other drug, medicinal and biological substance   . History of mammogram 2009  . Diverticulosis   . GERD (gastroesophageal reflux disease)   . Hyperlipidemia   . Atrial fibrillation   . Arthritis     hips, knees    Past Surgical History  Procedure Date  . Breast biopsy     left  . Cardiac electrophysiology mapping and ablation   . Abdominal hysterectomy     partial, ovaries left in place  . Cataract extraction, bilateral   . Esophagoscopy w/ botox injection     Family History  Problem Relation Age of Onset  . Diabetes Mother   . Stroke Mother   . COPD Father   . Heart disease Maternal Aunt   . Heart disease Maternal Uncle   . Allergies Daughter   . COPD Daughter   . Stroke Daughter   . COPD Daughter     previous smoker  . Stroke Maternal Grandfather     History   Social History  . Marital Status: Widowed    Spouse Name: N/A    Number of Children: N/A  . Years of Education: N/A   Occupational History  . Not on file.   Social History Main Topics  . Smoking status: Never Smoker   . Smokeless tobacco: Never Used  . Alcohol Use: No  . Drug Use: No  . Sexually Active: No   Other Topics Concern  . Not on file   Social History Narrative  . No narrative on file    Current Outpatient Prescriptions on File Prior to Visit  Medication Sig Dispense  Refill  . atorvastatin (LIPITOR) 10 MG tablet Take 1 tablet (10 mg total) by mouth daily.  30 tablet  11  . esomeprazole (NEXIUM) 40 MG capsule 1 cap AM and PM for reflux and cough  60 capsule  11  . estradiol (ESTRACE) 0.5 MG tablet Take 1 tablet (0.5 mg total) by mouth daily.  30 tablet  5  . hydrochlorothiazide 25 MG tablet Take 1 tablet (25 mg total) by mouth daily.  30 tablet  11  . ramipril (ALTACE) 10 MG capsule Take 1 capsule (10 mg total) by mouth daily.  30 capsule  11  . warfarin (COUMADIN) 2.5 MG tablet Take by mouth as directed.        . metoprolol (LOPRESSOR) 25 MG tablet Take 1 tablet (25 mg total) by mouth 2 (two) times daily. bid  60 tablet  11    Allergies  Allergen Reactions  . Darifenacin Hydrobromide     REACTION: causes her dizziness  . Sulfonamide Derivatives   . Tramadol Other (See Comments)    Insomnia, anorexia    Review of Systems  Review of Systems    Constitutional: Negative for fever and malaise/fatigue.  HENT: Negative for congestion.   Eyes: Negative for discharge.  Respiratory: Negative for shortness of breath.   Cardiovascular: Negative for chest pain, palpitations and leg swelling.  Gastrointestinal: Negative for nausea, abdominal pain and diarrhea.  Genitourinary: Positive for urgency and frequency. Negative for dysuria, hematuria and flank pain.  Musculoskeletal: Negative for falls.  Skin: Negative for rash.  Neurological: Negative for loss of consciousness and headaches.  Endo/Heme/Allergies: Negative for polydipsia.  Psychiatric/Behavioral: Negative for depression and suicidal ideas. The patient is not nervous/anxious and does not have insomnia.     Objective  BP 140/74  Pulse 60  Temp(Src) 99 F (37.2 C) (Temporal)  Ht 5' (1.524 m)  Wt 127 lb (57.607 kg)  BMI 24.80 kg/m2  SpO2 97%  Physical Exam  Physical Exam  Constitutional: She is oriented to person, place, and time and well-developed, well-nourished, and in no distress. No distress.  HENT:  Head: Normocephalic and atraumatic.  Eyes: Conjunctivae are normal.  Neck: Neck supple. No thyromegaly present.  Cardiovascular: Normal rate and normal heart sounds.        Irregularly irregular, rate controlled  Pulmonary/Chest: Effort normal and breath sounds normal. She has no wheezes.  Abdominal: She exhibits no distension and no mass.  Musculoskeletal: She exhibits no edema.  Lymphadenopathy:    She has no cervical adenopathy.  Neurological: She is alert and oriented to person, place, and time.  Skin: Skin is warm and dry. No rash noted. She is not diaphoretic.  Psychiatric: Memory, affect and judgment normal.    Lab Results  Component Value Date   TSH 2.21 02/23/2010   Lab Results  Component Value Date   WBC 10.2 08/23/2010   HGB 12.5 08/23/2010   HCT 36.5 08/23/2010   MCV 91.3 08/23/2010   PLT 159 08/23/2010   Lab Results  Component Value Date    CREATININE 1.10 08/23/2010   BUN 22 08/23/2010   NA 141 08/23/2010   K 4.0 08/23/2010   CL 105 08/23/2010   CO2 26 08/23/2010   Lab Results  Component Value Date   ALT 12 12/19/2010   AST 17 12/19/2010   ALKPHOS 15* 12/19/2010   BILITOT 0.5 12/19/2010   Lab Results  Component Value Date   CHOL 147 12/19/2010   Lab Results  Component Value Date  HDL 39.20 12/19/2010   Lab Results  Component Value Date   LDLCALC 72 12/19/2010   Lab Results  Component Value Date   TRIG 181.0* 12/19/2010   Lab Results  Component Value Date   CHOLHDL 4 12/19/2010     Assessment & Plan  VITAMIN D DEFICIENCY Check Vitamin D level today  HYPOTHYROIDISM Check a tsh  HYPERLIPIDEMIA Tolerating Atorvastatin, check lipids today  PAROXYSMAL ATRIAL FIBRILLATION Has occasional sense of palpitations but without associated symptoms. She should call for further evaluation if that changes  ESOPHAGEAL STRICTURE Very little difficulty with swallowing as long as shee eats slowly and cuts her food up small, she last had an appt with GI to have her esophagus stretched in 2011, she is to call for further treatment if this worsens  GERD Good response, continue nexium

## 2011-02-13 NOTE — Progress Notes (Signed)
Addended by: Court Joy on: 02/13/2011 01:12 PM   Modules accepted: Orders

## 2011-02-13 NOTE — Patient Instructions (Signed)

## 2011-02-13 NOTE — Assessment & Plan Note (Signed)
Check Vitamin D level today.  

## 2011-02-13 NOTE — Assessment & Plan Note (Signed)
Tolerating Atorvastatin, check lipids today

## 2011-02-13 NOTE — Assessment & Plan Note (Signed)
Very little difficulty with swallowing as long as shee eats slowly and cuts her food up small, she last had an appt with GI to have her esophagus stretched in 2011, she is to call for further treatment if this worsens

## 2011-02-13 NOTE — Assessment & Plan Note (Signed)
Good response, continue nexium

## 2011-02-13 NOTE — Assessment & Plan Note (Signed)
Has occasional sense of palpitations but without associated symptoms. She should call for further evaluation if that changes

## 2011-02-13 NOTE — Assessment & Plan Note (Signed)
Check a tsh

## 2011-02-15 LAB — VITAMIN D 1,25 DIHYDROXY: Vitamin D 1, 25 (OH)2 Total: 38 pg/mL (ref 18–72)

## 2011-02-16 ENCOUNTER — Ambulatory Visit (INDEPENDENT_AMBULATORY_CARE_PROVIDER_SITE_OTHER): Payer: Medicare Other | Admitting: *Deleted

## 2011-02-16 DIAGNOSIS — Z7901 Long term (current) use of anticoagulants: Secondary | ICD-10-CM

## 2011-02-16 DIAGNOSIS — I4891 Unspecified atrial fibrillation: Secondary | ICD-10-CM

## 2011-02-26 ENCOUNTER — Other Ambulatory Visit: Payer: Self-pay | Admitting: Family Medicine

## 2011-03-01 ENCOUNTER — Other Ambulatory Visit: Payer: Self-pay

## 2011-03-01 MED ORDER — WARFARIN SODIUM 2.5 MG PO TABS: ORAL_TABLET | ORAL | Status: DC

## 2011-03-02 ENCOUNTER — Ambulatory Visit (INDEPENDENT_AMBULATORY_CARE_PROVIDER_SITE_OTHER): Payer: Medicare Other

## 2011-03-02 DIAGNOSIS — I4891 Unspecified atrial fibrillation: Secondary | ICD-10-CM

## 2011-03-02 DIAGNOSIS — Z7901 Long term (current) use of anticoagulants: Secondary | ICD-10-CM

## 2011-03-02 LAB — POCT INR: INR: 2.6

## 2011-03-23 ENCOUNTER — Ambulatory Visit (INDEPENDENT_AMBULATORY_CARE_PROVIDER_SITE_OTHER): Payer: Medicare Other | Admitting: *Deleted

## 2011-03-23 DIAGNOSIS — Z7901 Long term (current) use of anticoagulants: Secondary | ICD-10-CM

## 2011-03-23 DIAGNOSIS — I4891 Unspecified atrial fibrillation: Secondary | ICD-10-CM

## 2011-03-23 LAB — POCT INR: INR: 1.9

## 2011-04-13 ENCOUNTER — Ambulatory Visit (INDEPENDENT_AMBULATORY_CARE_PROVIDER_SITE_OTHER): Payer: Medicare Other | Admitting: Pharmacist

## 2011-04-13 DIAGNOSIS — Z7901 Long term (current) use of anticoagulants: Secondary | ICD-10-CM

## 2011-04-13 DIAGNOSIS — I4891 Unspecified atrial fibrillation: Secondary | ICD-10-CM

## 2011-04-13 LAB — POCT INR: INR: 2.3

## 2011-05-11 ENCOUNTER — Ambulatory Visit (INDEPENDENT_AMBULATORY_CARE_PROVIDER_SITE_OTHER): Payer: Medicare Other | Admitting: Pharmacist

## 2011-05-11 DIAGNOSIS — Z7901 Long term (current) use of anticoagulants: Secondary | ICD-10-CM

## 2011-05-11 DIAGNOSIS — I4891 Unspecified atrial fibrillation: Secondary | ICD-10-CM

## 2011-05-11 LAB — POCT INR: INR: 2.3

## 2011-05-24 ENCOUNTER — Other Ambulatory Visit: Payer: Self-pay

## 2011-05-24 ENCOUNTER — Other Ambulatory Visit: Payer: Self-pay | Admitting: Family Medicine

## 2011-06-08 ENCOUNTER — Ambulatory Visit (INDEPENDENT_AMBULATORY_CARE_PROVIDER_SITE_OTHER): Payer: Medicare Other | Admitting: Pharmacist

## 2011-06-08 DIAGNOSIS — Z7901 Long term (current) use of anticoagulants: Secondary | ICD-10-CM

## 2011-06-08 DIAGNOSIS — I4891 Unspecified atrial fibrillation: Secondary | ICD-10-CM

## 2011-06-08 LAB — POCT INR: INR: 1.5

## 2011-06-22 ENCOUNTER — Ambulatory Visit (INDEPENDENT_AMBULATORY_CARE_PROVIDER_SITE_OTHER): Payer: Medicare Other | Admitting: *Deleted

## 2011-06-22 DIAGNOSIS — Z7901 Long term (current) use of anticoagulants: Secondary | ICD-10-CM

## 2011-06-22 DIAGNOSIS — I4891 Unspecified atrial fibrillation: Secondary | ICD-10-CM

## 2011-06-25 ENCOUNTER — Other Ambulatory Visit: Payer: Self-pay

## 2011-06-25 MED ORDER — METOPROLOL TARTRATE 25 MG PO TABS: 25.0000 mg | ORAL_TABLET | Freq: Two times a day (BID) | ORAL | Status: DC

## 2011-06-25 MED ORDER — RAMIPRIL 10 MG PO CAPS: 10.0000 mg | ORAL_CAPSULE | Freq: Every day | ORAL | Status: DC

## 2011-07-02 ENCOUNTER — Ambulatory Visit (INDEPENDENT_AMBULATORY_CARE_PROVIDER_SITE_OTHER): Payer: Medicare Other | Admitting: *Deleted

## 2011-07-02 DIAGNOSIS — I4891 Unspecified atrial fibrillation: Secondary | ICD-10-CM

## 2011-07-02 DIAGNOSIS — Z7901 Long term (current) use of anticoagulants: Secondary | ICD-10-CM

## 2011-07-12 ENCOUNTER — Ambulatory Visit (INDEPENDENT_AMBULATORY_CARE_PROVIDER_SITE_OTHER): Payer: Medicare Other | Admitting: *Deleted

## 2011-07-12 DIAGNOSIS — Z7901 Long term (current) use of anticoagulants: Secondary | ICD-10-CM

## 2011-07-12 DIAGNOSIS — I4891 Unspecified atrial fibrillation: Secondary | ICD-10-CM

## 2011-07-23 ENCOUNTER — Other Ambulatory Visit: Payer: Self-pay

## 2011-07-23 MED ORDER — WARFARIN SODIUM 2.5 MG PO TABS: 2.5000 mg | ORAL_TABLET | Freq: Every day | ORAL | Status: DC

## 2011-08-02 ENCOUNTER — Ambulatory Visit (INDEPENDENT_AMBULATORY_CARE_PROVIDER_SITE_OTHER): Payer: Medicare Other

## 2011-08-02 DIAGNOSIS — Z7901 Long term (current) use of anticoagulants: Secondary | ICD-10-CM

## 2011-08-02 DIAGNOSIS — I4891 Unspecified atrial fibrillation: Secondary | ICD-10-CM

## 2011-08-14 ENCOUNTER — Other Ambulatory Visit (INDEPENDENT_AMBULATORY_CARE_PROVIDER_SITE_OTHER): Payer: Medicare Other

## 2011-08-14 DIAGNOSIS — I10 Essential (primary) hypertension: Secondary | ICD-10-CM

## 2011-08-14 LAB — CBC
RDW: 15.3 % — ABNORMAL HIGH (ref 11.5–14.6)
WBC: 4.2 10*3/uL — ABNORMAL LOW (ref 4.5–10.5)

## 2011-08-15 LAB — LIPID PANEL
HDL: 36.2 mg/dL — ABNORMAL LOW (ref >39.00)
LDL Cholesterol: 66 mg/dL (ref 0–99)
Total CHOL/HDL Ratio: 4
VLDL: 39.4 mg/dL (ref 0.0–40.0)

## 2011-08-15 LAB — RENAL FUNCTION PANEL
Albumin: 3.6 g/dL (ref 3.5–5.2)
BUN: 14 mg/dL (ref 6–23)
Calcium: 8.7 mg/dL (ref 8.4–10.5)
Chloride: 104 mEq/L (ref 96–112)
Phosphorus: 4.8 mg/dL — ABNORMAL HIGH (ref 2.3–4.6)
Potassium: 4.3 mEq/L (ref 3.5–5.1)

## 2011-08-15 LAB — HEPATIC FUNCTION PANEL
AST: 19 U/L (ref 0–37)
Alkaline Phosphatase: 15 U/L — ABNORMAL LOW (ref 39–117)
Total Bilirubin: 0.5 mg/dL (ref 0.3–1.2)

## 2011-08-15 NOTE — Progress Notes (Signed)
Quick Note:  Patient Informed and voiced understanding ______ 

## 2011-08-22 ENCOUNTER — Encounter: Payer: Self-pay | Admitting: Family Medicine

## 2011-08-22 ENCOUNTER — Ambulatory Visit (INDEPENDENT_AMBULATORY_CARE_PROVIDER_SITE_OTHER): Payer: Medicare Other | Admitting: Family Medicine

## 2011-08-22 VITALS — BP 123/75 | HR 61 | Temp 97.5°F | Ht 60.0 in | Wt 121.8 lb

## 2011-08-22 DIAGNOSIS — F411 Generalized anxiety disorder: Secondary | ICD-10-CM

## 2011-08-22 DIAGNOSIS — I4891 Unspecified atrial fibrillation: Secondary | ICD-10-CM

## 2011-08-22 DIAGNOSIS — N951 Menopausal and female climacteric states: Secondary | ICD-10-CM

## 2011-08-22 DIAGNOSIS — E559 Vitamin D deficiency, unspecified: Secondary | ICD-10-CM

## 2011-08-22 DIAGNOSIS — F419 Anxiety disorder, unspecified: Secondary | ICD-10-CM

## 2011-08-22 DIAGNOSIS — E039 Hypothyroidism, unspecified: Secondary | ICD-10-CM

## 2011-08-22 DIAGNOSIS — I10 Essential (primary) hypertension: Secondary | ICD-10-CM

## 2011-08-22 MED ORDER — ALPRAZOLAM 0.25 MG PO TABS: 0.2500 mg | ORAL_TABLET | Freq: Three times a day (TID) | ORAL | Status: DC

## 2011-08-22 MED ORDER — ESTRADIOL 0.5 MG PO TABS: 0.2500 mg | ORAL_TABLET | Freq: Every day | ORAL | Status: DC

## 2011-08-22 NOTE — Patient Instructions (Addendum)

## 2011-08-26 ENCOUNTER — Encounter: Payer: Self-pay | Admitting: Family Medicine

## 2011-08-26 NOTE — Assessment & Plan Note (Signed)
Asymptomatic, follows with cardiology 

## 2011-08-26 NOTE — Progress Notes (Signed)
Patient ID: [name] [name], female   DOB: 02/22/1924, 78 y.o.   MRN: [id number] [name] [name] [id number] February 12, 1924 08/26/2011      Progress Note-Follow Up  Subjective  Chief Complaint  Chief Complaint  Patient presents with  . Follow-up    6 month    HPI  Patient is an 78 year old Caucasian female who is in today for followup. She's doing fairly well. She's had no recent illness, fevers, chills, chest pain, palpitations, shortness of breath, GI or GU complaints. Her ADLs are going well. Her appetite is good. She has an occasional mild headache but responds easily to Tylenol.  Past Medical History  Diagnosis Date  . Paroxysmal a-fib   . Mitral and aortic regurgitation   . Mitral valve prolapse     hx of  . Hypertension   . Hyperlipidemia   . Vitamin d deficiency   . Pneumonia   . Tracheitis   . Gait difficulty   . Anemia   . Melena   . IBS (irritable bowel syndrome)   . Incontinence   . GERD (gastroesophageal reflux disease)   . Osteopenia   . Thyroid disease     hypo  . Personal history of colonic polyps   . Current use of long term anticoagulation   . Cough   . Diarrhea   . Pneumonia     organism unspecified  . Constipation   . UTI (lower urinary tract infection)   . Unspecified adverse effect of other drug, medicinal and biological substance   . History of mammogram 2009  . Diverticulosis   . GERD (gastroesophageal reflux disease)   . Hyperlipidemia   . Atrial fibrillation   . Arthritis     hips, knees    Past Surgical History  Procedure Date  . Breast biopsy     left  . Cardiac electrophysiology mapping and ablation   . Abdominal hysterectomy     partial, ovaries left in place  . Cataract extraction, bilateral   . Esophagoscopy w/ botox injection     Family History  Problem Relation Age of Onset  . Diabetes Mother   . Stroke Mother   . COPD Father   . Heart disease Maternal Aunt   . Heart disease Maternal Uncle   . Allergies Daughter   .  COPD Daughter   . Stroke Daughter   . COPD Daughter     previous smoker  . Stroke Maternal Grandfather     History   Social History  . Marital Status: Widowed    Spouse Name: N/A    Number of Children: N/A  . Years of Education: N/A   Occupational History  . Not on file.   Social History Main Topics  . Smoking status: Never Smoker   . Smokeless tobacco: Never Used  . Alcohol Use: No  . Drug Use: No  . Sexually Active: No   Other Topics Concern  . Not on file   Social History Narrative  . No narrative on file    Current Outpatient Prescriptions on File Prior to Visit  Medication Sig Dispense Refill  . atorvastatin (LIPITOR) 10 MG tablet Take 1 tablet (10 mg total) by mouth daily.  30 tablet  11  . esomeprazole (NEXIUM) 40 MG capsule 1 cap AM and PM for reflux and cough  60 capsule  11  . estradiol (ESTRACE) 0.5 MG tablet Take 0.5 tablets (0.25 mg total) by mouth daily.  30 tablet  11  . hydrochlorothiazide (  HYDRODIURIL) 25 MG tablet Take 1 tablet (25 mg total) by mouth daily.  30 tablet  11  . metoprolol tartrate (LOPRESSOR) 25 MG tablet Take 1 tablet (25 mg total) by mouth 2 (two) times daily. bid  60 tablet  11  . ramipril (ALTACE) 10 MG capsule Take 1 capsule (10 mg total) by mouth daily.  30 capsule  11  . warfarin (COUMADIN) 2.5 MG tablet Take 1 tablet (2.5 mg total) by mouth daily.  30 tablet  1    Allergies  Allergen Reactions  . Darifenacin Hydrobromide     REACTION: causes her dizziness  . Sulfonamide Derivatives   . Tramadol Other (See Comments)    Insomnia, anorexia    Review of Systems  Review of Systems  Constitutional: Negative for fever and malaise/fatigue.  HENT: Negative for congestion.   Eyes: Negative for discharge.  Respiratory: Negative for shortness of breath.   Cardiovascular: Negative for chest pain, palpitations and leg swelling.  Gastrointestinal: Negative for nausea, abdominal pain and diarrhea.  Genitourinary: Negative for  dysuria.  Musculoskeletal: Negative for falls.  Skin: Negative for rash.  Neurological: Negative for loss of consciousness and headaches.  Endo/Heme/Allergies: Negative for polydipsia.  Psychiatric/Behavioral: Negative for depression and suicidal ideas. The patient is not nervous/anxious and does not have insomnia.     Objective  BP 123/75  Pulse 61  Temp 97.5 F (36.4 C) (Temporal)  Ht 5' (1.524 m)  Wt 121 lb 12.8 oz (55.248 kg)  BMI 23.79 kg/m2  SpO2 95%  Physical Exam  Physical Exam  Constitutional: She is oriented to person, place, and time and well-developed, well-nourished, and in no distress. No distress.  HENT:  Head: Normocephalic and atraumatic.  Eyes: Conjunctivae are normal.  Neck: Neck supple. No thyromegaly present.  Cardiovascular: Normal rate, regular rhythm and normal heart sounds.   Pulmonary/Chest: Effort normal and breath sounds normal. She has no wheezes.  Abdominal: She exhibits no distension and no mass.  Musculoskeletal: She exhibits no edema.  Lymphadenopathy:    She has no cervical adenopathy.  Neurological: She is alert and oriented to person, place, and time.  Skin: Skin is warm and dry. No rash noted. She is not diaphoretic.  Psychiatric: Memory, affect and judgment normal.    Lab Results  Component Value Date   TSH 4.03 08/14/2011   Lab Results  Component Value Date   WBC 4.2* 08/14/2011   HGB 11.4* 08/14/2011   HCT 34.5* 08/14/2011   MCV 92.9 08/14/2011   PLT 159.0 08/14/2011   Lab Results  Component Value Date   CREATININE 1.3* 08/14/2011   BUN 14 08/14/2011   NA 141 08/14/2011   K 4.3 08/14/2011   CL 104 08/14/2011   CO2 29 08/14/2011   Lab Results  Component Value Date   ALT 14 08/14/2011   AST 19 08/14/2011   ALKPHOS 15* 08/14/2011   BILITOT 0.5 08/14/2011   Lab Results  Component Value Date   CHOL 142 08/14/2011   Lab Results  Component Value Date   HDL 36.20* 08/14/2011   Lab Results  Component Value Date   LDLCALC 66  08/14/2011   Lab Results  Component Value Date   TRIG 197.0* 08/14/2011   Lab Results  Component Value Date   CHOLHDL 4 08/14/2011     Assessment & Plan  PAROXYSMAL ATRIAL FIBRILLATION Asymptomatic, follows with cardiology  VITAMIN D DEFICIENCY Taking Vitamin D 1000 IU daily, will monitor  HYPERTENSION Adequately controlled, no changes  HYPOTHYROIDISM Well controlled on current dose of meds

## 2011-08-26 NOTE — Assessment & Plan Note (Signed)
Well controlled on current dose of meds 

## 2011-08-26 NOTE — Assessment & Plan Note (Signed)
Adequately controlled, no changes 

## 2011-08-26 NOTE — Assessment & Plan Note (Signed)
Taking Vitamin D 1000 IU daily, will monitor

## 2011-08-30 ENCOUNTER — Ambulatory Visit (INDEPENDENT_AMBULATORY_CARE_PROVIDER_SITE_OTHER): Payer: Medicare Other | Admitting: *Deleted

## 2011-08-30 DIAGNOSIS — I4891 Unspecified atrial fibrillation: Secondary | ICD-10-CM

## 2011-08-30 DIAGNOSIS — Z7901 Long term (current) use of anticoagulants: Secondary | ICD-10-CM

## 2011-08-30 LAB — POCT INR: INR: 1.6

## 2011-09-13 ENCOUNTER — Ambulatory Visit (INDEPENDENT_AMBULATORY_CARE_PROVIDER_SITE_OTHER): Payer: Medicare Other | Admitting: *Deleted

## 2011-09-13 DIAGNOSIS — I4891 Unspecified atrial fibrillation: Secondary | ICD-10-CM

## 2011-09-13 DIAGNOSIS — Z7901 Long term (current) use of anticoagulants: Secondary | ICD-10-CM

## 2011-09-13 LAB — POCT INR: INR: 2.1

## 2011-09-20 ENCOUNTER — Ambulatory Visit (INDEPENDENT_AMBULATORY_CARE_PROVIDER_SITE_OTHER): Payer: Medicare Other | Admitting: Cardiology

## 2011-09-20 ENCOUNTER — Encounter: Payer: Self-pay | Admitting: Cardiology

## 2011-09-20 ENCOUNTER — Other Ambulatory Visit: Payer: Self-pay | Admitting: Family Medicine

## 2011-09-20 VITALS — BP 140/62 | HR 47 | Ht 60.0 in | Wt 121.0 lb

## 2011-09-20 DIAGNOSIS — Z8679 Personal history of other diseases of the circulatory system: Secondary | ICD-10-CM

## 2011-09-20 DIAGNOSIS — R001 Bradycardia, unspecified: Secondary | ICD-10-CM

## 2011-09-20 DIAGNOSIS — I080 Rheumatic disorders of both mitral and aortic valves: Secondary | ICD-10-CM

## 2011-09-20 DIAGNOSIS — E785 Hyperlipidemia, unspecified: Secondary | ICD-10-CM

## 2011-09-20 DIAGNOSIS — I4891 Unspecified atrial fibrillation: Secondary | ICD-10-CM

## 2011-09-20 DIAGNOSIS — I10 Essential (primary) hypertension: Secondary | ICD-10-CM

## 2011-09-20 DIAGNOSIS — I498 Other specified cardiac arrhythmias: Secondary | ICD-10-CM

## 2011-09-20 MED ORDER — METOPROLOL TARTRATE 25 MG PO TABS: 12.5000 mg | ORAL_TABLET | Freq: Two times a day (BID) | ORAL | Status: DC

## 2011-09-20 NOTE — Progress Notes (Signed)
HPI [name] [name] returns today for evaluation and management of her history of paroxysmal A. fib, anticoagulation, history of mild aortic and mitral regurgitation, and history of mitral valve prolapse.  She still remarkably independent living with her daughter. She still drives. She has not fallen. She denies any bleeding or melena. She denies any presyncope syncope. She has occasional short-lived palpitations. She states that her leg soreness is a little bit better on lower dose Lipitor. Total cholesterol still 190 per primary care.  Past Medical History  Diagnosis Date  . Paroxysmal a-fib   . Mitral and aortic regurgitation   . Mitral valve prolapse     hx of  . Hypertension   . Hyperlipidemia   . Vitamin d deficiency   . Pneumonia   . Tracheitis   . Gait difficulty   . Anemia   . Melena   . IBS (irritable bowel syndrome)   . Incontinence   . GERD (gastroesophageal reflux disease)   . Osteopenia   . Thyroid disease     hypo  . Personal history of colonic polyps   . Current use of long term anticoagulation   . Cough   . Diarrhea   . Pneumonia     organism unspecified  . Constipation   . UTI (lower urinary tract infection)   . Unspecified adverse effect of other drug, medicinal and biological substance   . History of mammogram 2009  . Diverticulosis   . GERD (gastroesophageal reflux disease)   . Hyperlipidemia   . Atrial fibrillation   . Arthritis     hips, knees    Current Outpatient Prescriptions  Medication Sig Dispense Refill  . ALPRAZolam (XANAX) 0.25 MG tablet Take 1 tablet (0.25 mg total) by mouth 3 (three) times daily. For stress  90 tablet  5  . atorvastatin (LIPITOR) 10 MG tablet Take 1 tablet (10 mg total) by mouth daily.  30 tablet  11  . esomeprazole (NEXIUM) 40 MG capsule 1 cap AM and PM for reflux and cough  60 capsule  11  . estradiol (ESTRACE) 0.5 MG tablet Take 0.5 tablets (0.25 mg total) by mouth daily.  30 tablet  11  . hydrochlorothiazide  (HYDRODIURIL) 25 MG tablet Take 1 tablet (25 mg total) by mouth daily.  30 tablet  11  . metoprolol tartrate (LOPRESSOR) 25 MG tablet Take 1 tablet (25 mg total) by mouth 2 (two) times daily. bid  60 tablet  11  . ramipril (ALTACE) 10 MG capsule Take 1 capsule (10 mg total) by mouth daily.  30 capsule  11  . warfarin (COUMADIN) 2.5 MG tablet Take 1 tablet (2.5 mg total) by mouth daily.  30 tablet  1    Allergies  Allergen Reactions  . Darifenacin Hydrobromide     REACTION: causes her dizziness  . Sulfonamide Derivatives   . Tramadol Other (See Comments)    Insomnia, anorexia    Family History  Problem Relation Age of Onset  . Diabetes Mother   . Stroke Mother   . COPD Father   . Heart disease Maternal Aunt   . Heart disease Maternal Uncle   . Allergies Daughter   . COPD Daughter   . Stroke Daughter   . COPD Daughter     previous smoker  . Stroke Maternal Grandfather     History   Social History  . Marital Status: Widowed    Spouse Name: N/A    Number of Children: N/A  . Years of  Education: N/A   Occupational History  . Not on file.   Social History Main Topics  . Smoking status: Never Smoker   . Smokeless tobacco: Never Used  . Alcohol Use: No  . Drug Use: No  . Sexually Active: No   Other Topics Concern  . Not on file   Social History Narrative  . No narrative on file    ROS ALL NEGATIVE EXCEPT THOSE NOTED IN HPI  PE  General Appearance: well developed, well nourished in no acute distress HEENT: symmetrical face, PERRLA, good dentition  Neck: no JVD, thyromegaly, or adenopathy, trachea midline Chest: symmetric without deformity Cardiac: PMI non-displaced, RRR, normal S1, S2, no gallop or murmur Lung: clear to ausculation and percussion Vascular: all pulses full without bruits  Abdominal: nondistended, nontender, good bowel sounds, no HSM, no bruits Extremities: no cyanosis, clubbing or edema, no sign of DVT, no varicosities  Skin: normal color, no  rashes Neuro: alert and oriented x 3, non-focal Pysch: normal affect  EKG Sinus bradycardia rate in the 40s. BMET    Component Value Date/Time   NA 141 08/14/2011 1030   K 4.3 08/14/2011 1030   CL 104 08/14/2011 1030   CO2 29 08/14/2011 1030   GLUCOSE 95 08/14/2011 1030   BUN 14 08/14/2011 1030   CREATININE 1.3* 08/14/2011 1030   CALCIUM 8.7 08/14/2011 1030   GFRNONAA 47* 08/23/2010 1005   GFRAA 57* 08/23/2010 1005    Lipid Panel     Component Value Date/Time   CHOL 142 08/14/2011 1030   TRIG 197.0* 08/14/2011 1030   HDL 36.20* 08/14/2011 1030   CHOLHDL 4 08/14/2011 1030   VLDL 39.4 08/14/2011 1030   LDLCALC 66 08/14/2011 1030    CBC    Component Value Date/Time   WBC 4.2* 08/14/2011 1030   RBC 3.71* 08/14/2011 1030   HGB 11.4* 08/14/2011 1030   HCT 34.5* 08/14/2011 1030   PLT 159.0 08/14/2011 1030   MCV 92.9 08/14/2011 1030   MCH 31.3 08/23/2010 1005   MCHC 33.1 08/14/2011 1030   RDW 15.3* 08/14/2011 1030   LYMPHSABS 0.5* 08/23/2010 1005   MONOABS 0.6 08/23/2010 1005   EOSABS 0.0 08/23/2010 1005   BASOSABS 0.0 08/23/2010 1005

## 2011-09-20 NOTE — Assessment & Plan Note (Addendum)
Although she is asymptomatic, I worry about this becoming slower and causing syncope and a fall. We'll decrease her Lopressor to 12-1/2 mg twice a day.

## 2011-09-20 NOTE — Patient Instructions (Addendum)
Your physician has recommended you make the following change in your medication:  Decrease Metoprolol tartrate to 12.5 mg twice a day  Your physician wants you to follow-up in: 1 year with Dr. Daleen Squibb. You will receive a reminder letter in the mail two months in advance. If you don't receive a letter, please call our office to schedule the follow-up appointment.

## 2011-09-21 ENCOUNTER — Other Ambulatory Visit: Payer: Self-pay

## 2011-09-21 MED ORDER — METOPROLOL TARTRATE 25 MG PO TABS: 12.5000 mg | ORAL_TABLET | Freq: Two times a day (BID) | ORAL | Status: DC

## 2011-09-21 MED ORDER — RAMIPRIL 10 MG PO CAPS: 10.0000 mg | ORAL_CAPSULE | Freq: Every day | ORAL | Status: DC

## 2011-10-04 ENCOUNTER — Ambulatory Visit (INDEPENDENT_AMBULATORY_CARE_PROVIDER_SITE_OTHER): Payer: Medicare Other | Admitting: *Deleted

## 2011-10-04 DIAGNOSIS — Z7901 Long term (current) use of anticoagulants: Secondary | ICD-10-CM

## 2011-10-04 DIAGNOSIS — I4891 Unspecified atrial fibrillation: Secondary | ICD-10-CM

## 2011-10-19 ENCOUNTER — Ambulatory Visit (INDEPENDENT_AMBULATORY_CARE_PROVIDER_SITE_OTHER): Payer: Medicare Other | Admitting: Pharmacist

## 2011-10-19 DIAGNOSIS — Z7901 Long term (current) use of anticoagulants: Secondary | ICD-10-CM

## 2011-10-19 DIAGNOSIS — I4891 Unspecified atrial fibrillation: Secondary | ICD-10-CM

## 2011-11-02 ENCOUNTER — Ambulatory Visit (INDEPENDENT_AMBULATORY_CARE_PROVIDER_SITE_OTHER): Payer: Medicare Other

## 2011-11-02 DIAGNOSIS — I4891 Unspecified atrial fibrillation: Secondary | ICD-10-CM

## 2011-11-02 DIAGNOSIS — Z7901 Long term (current) use of anticoagulants: Secondary | ICD-10-CM

## 2011-11-16 ENCOUNTER — Ambulatory Visit (INDEPENDENT_AMBULATORY_CARE_PROVIDER_SITE_OTHER): Payer: Medicare Other | Admitting: *Deleted

## 2011-11-16 DIAGNOSIS — I4891 Unspecified atrial fibrillation: Secondary | ICD-10-CM

## 2011-11-16 DIAGNOSIS — Z7901 Long term (current) use of anticoagulants: Secondary | ICD-10-CM

## 2011-12-07 ENCOUNTER — Ambulatory Visit (INDEPENDENT_AMBULATORY_CARE_PROVIDER_SITE_OTHER): Payer: Medicare Other | Admitting: *Deleted

## 2011-12-07 ENCOUNTER — Telehealth: Payer: Self-pay

## 2011-12-07 DIAGNOSIS — Z7901 Long term (current) use of anticoagulants: Secondary | ICD-10-CM

## 2011-12-07 DIAGNOSIS — I4891 Unspecified atrial fibrillation: Secondary | ICD-10-CM

## 2011-12-07 NOTE — Telephone Encounter (Signed)
Per MD call pt before filling out PA for Estradiol due to MD believing that pt is wanting to quit this med.   I spoke with patient and she stated that she is wanting to try to quit this medication.

## 2011-12-20 ENCOUNTER — Other Ambulatory Visit: Payer: Self-pay | Admitting: Family Medicine

## 2011-12-22 ENCOUNTER — Other Ambulatory Visit: Payer: Self-pay | Admitting: Family Medicine

## 2011-12-29 ENCOUNTER — Other Ambulatory Visit: Payer: Self-pay | Admitting: Family Medicine

## 2011-12-31 ENCOUNTER — Other Ambulatory Visit: Payer: Self-pay

## 2011-12-31 MED ORDER — WARFARIN SODIUM 2.5 MG PO TABS: 2.5000 mg | ORAL_TABLET | Freq: Every day | ORAL | Status: DC

## 2012-01-04 ENCOUNTER — Ambulatory Visit (INDEPENDENT_AMBULATORY_CARE_PROVIDER_SITE_OTHER): Payer: Medicare Other | Admitting: *Deleted

## 2012-01-04 DIAGNOSIS — I4891 Unspecified atrial fibrillation: Secondary | ICD-10-CM

## 2012-01-04 DIAGNOSIS — Z7901 Long term (current) use of anticoagulants: Secondary | ICD-10-CM

## 2012-01-19 ENCOUNTER — Other Ambulatory Visit: Payer: Self-pay | Admitting: Family Medicine

## 2012-01-21 ENCOUNTER — Telehealth: Payer: Self-pay | Admitting: Family Medicine

## 2012-01-21 MED ORDER — HYDROCHLOROTHIAZIDE 25 MG PO TABS: 25.0000 mg | ORAL_TABLET | Freq: Every day | ORAL | Status: DC

## 2012-01-21 NOTE — Telephone Encounter (Signed)
Refill- hydrochlorot 25mg  tab. Take one tablet(25mg  total) by mouth daily. Qty 30 last fill 12.16.13

## 2012-02-14 ENCOUNTER — Other Ambulatory Visit: Payer: Medicare Other

## 2012-02-18 ENCOUNTER — Other Ambulatory Visit (INDEPENDENT_AMBULATORY_CARE_PROVIDER_SITE_OTHER): Payer: Medicare Other

## 2012-02-18 DIAGNOSIS — I10 Essential (primary) hypertension: Secondary | ICD-10-CM

## 2012-02-18 DIAGNOSIS — E559 Vitamin D deficiency, unspecified: Secondary | ICD-10-CM

## 2012-02-18 DIAGNOSIS — D649 Anemia, unspecified: Secondary | ICD-10-CM

## 2012-02-18 DIAGNOSIS — E039 Hypothyroidism, unspecified: Secondary | ICD-10-CM

## 2012-02-18 LAB — CBC
HCT: 34.3 % — ABNORMAL LOW (ref 36.0–46.0)
MCV: 92.9 fl (ref 78.0–100.0)
RBC: 3.69 Mil/uL — ABNORMAL LOW (ref 3.87–5.11)

## 2012-02-18 LAB — LIPID PANEL
Cholesterol: 121 mg/dL (ref 0–200)
LDL Cholesterol: 59 mg/dL (ref 0–99)
Total CHOL/HDL Ratio: 4
Triglycerides: 145 mg/dL (ref 0.0–149.0)
VLDL: 29 mg/dL (ref 0.0–40.0)

## 2012-02-18 LAB — HEPATIC FUNCTION PANEL
ALT: 15 U/L (ref 0–35)
AST: 20 U/L (ref 0–37)
Alkaline Phosphatase: 14 U/L — ABNORMAL LOW (ref 39–117)
Bilirubin, Direct: 0 mg/dL (ref 0.0–0.3)
Total Bilirubin: 0.4 mg/dL (ref 0.3–1.2)

## 2012-02-18 LAB — RENAL FUNCTION PANEL
Albumin: 3.8 g/dL (ref 3.5–5.2)
Calcium: 8.7 mg/dL (ref 8.4–10.5)
Creatinine, Ser: 1.3 mg/dL — ABNORMAL HIGH (ref 0.4–1.2)
Glucose, Bld: 101 mg/dL — ABNORMAL HIGH (ref 70–99)
Potassium: 4.4 mEq/L (ref 3.5–5.1)

## 2012-02-18 LAB — TSH: TSH: 3.62 u[IU]/mL (ref 0.35–5.50)

## 2012-02-18 LAB — IBC PANEL: Transferrin: 236.2 mg/dL (ref 212.0–360.0)

## 2012-02-18 NOTE — Progress Notes (Signed)
Labs only

## 2012-02-19 ENCOUNTER — Telehealth: Payer: Self-pay | Admitting: Family Medicine

## 2012-02-19 MED ORDER — ATORVASTATIN CALCIUM 10 MG PO TABS: 10.0000 mg | ORAL_TABLET | Freq: Every day | ORAL | Status: DC

## 2012-02-19 MED ORDER — ESOMEPRAZOLE MAGNESIUM 40 MG PO CPDR: DELAYED_RELEASE_CAPSULE | ORAL | Status: DC

## 2012-02-19 NOTE — Telephone Encounter (Signed)
Refill- nexium 40mg  cap. Take one capsule in the am and one capsule in the pm for reflux and cough as directed by doctor. Qty 60 last fill 1.18.14  Refill- atorvastatin 10mg  tab. Take one tablet (10mg  total) by mouth daily. Qty 30 last fill 1.18.14

## 2012-02-20 ENCOUNTER — Encounter: Payer: Self-pay | Admitting: Family Medicine

## 2012-02-20 ENCOUNTER — Ambulatory Visit (INDEPENDENT_AMBULATORY_CARE_PROVIDER_SITE_OTHER): Payer: Medicare Other | Admitting: Family Medicine

## 2012-02-20 VITALS — BP 129/74 | HR 88 | Temp 98.1°F | Ht 60.0 in | Wt 124.0 lb

## 2012-02-20 DIAGNOSIS — E559 Vitamin D deficiency, unspecified: Secondary | ICD-10-CM

## 2012-02-20 DIAGNOSIS — E538 Deficiency of other specified B group vitamins: Secondary | ICD-10-CM

## 2012-02-20 DIAGNOSIS — F419 Anxiety disorder, unspecified: Secondary | ICD-10-CM

## 2012-02-20 DIAGNOSIS — N951 Menopausal and female climacteric states: Secondary | ICD-10-CM

## 2012-02-20 DIAGNOSIS — E039 Hypothyroidism, unspecified: Secondary | ICD-10-CM

## 2012-02-20 DIAGNOSIS — K219 Gastro-esophageal reflux disease without esophagitis: Secondary | ICD-10-CM

## 2012-02-20 DIAGNOSIS — I10 Essential (primary) hypertension: Secondary | ICD-10-CM

## 2012-02-20 MED ORDER — METOPROLOL TARTRATE 25 MG PO TABS: 12.5000 mg | ORAL_TABLET | Freq: Two times a day (BID) | ORAL | Status: DC

## 2012-02-20 MED ORDER — ESTRADIOL 0.5 MG PO TABS: 0.2500 mg | ORAL_TABLET | Freq: Every day | ORAL | Status: DC

## 2012-02-20 MED ORDER — VITAMIN B-12 1000 MCG PO TABS: 1000.0000 ug | ORAL_TABLET | Freq: Every day | ORAL | Status: DC

## 2012-02-20 MED ORDER — ALPRAZOLAM 0.25 MG PO TABS: 0.2500 mg | ORAL_TABLET | Freq: Three times a day (TID) | ORAL | Status: DC

## 2012-02-20 MED ORDER — RAMIPRIL 10 MG PO CAPS: 10.0000 mg | ORAL_CAPSULE | Freq: Every day | ORAL | Status: DC

## 2012-02-20 MED ORDER — HYDROCHLOROTHIAZIDE 25 MG PO TABS: 25.0000 mg | ORAL_TABLET | Freq: Every day | ORAL | Status: DC

## 2012-02-20 MED ORDER — VITAMIN D 50 MCG (2000 UT) PO CAPS: 2000.0000 [IU] | ORAL_CAPSULE | Freq: Every day | ORAL | Status: DC

## 2012-02-20 MED ORDER — ESOMEPRAZOLE MAGNESIUM 40 MG PO CPDR: DELAYED_RELEASE_CAPSULE | ORAL | Status: DC

## 2012-02-21 ENCOUNTER — Ambulatory Visit: Payer: Medicare Other | Admitting: Family Medicine

## 2012-02-21 ENCOUNTER — Ambulatory Visit (INDEPENDENT_AMBULATORY_CARE_PROVIDER_SITE_OTHER): Payer: Medicare Other

## 2012-02-21 DIAGNOSIS — Z7901 Long term (current) use of anticoagulants: Secondary | ICD-10-CM

## 2012-02-21 LAB — POCT INR: INR: 2.2

## 2012-02-24 ENCOUNTER — Encounter: Payer: Self-pay | Admitting: Family Medicine

## 2012-02-24 NOTE — Assessment & Plan Note (Signed)
Well controlled, no changes in meds today

## 2012-02-24 NOTE — Progress Notes (Signed)
Patient ID: [name] [name], female   DOB: [birth year], 79 y.o.   MRN: [id number] [name] [name] [id number] Feb 13, 1924 02/24/2012      Progress Note-Follow Up  Subjective  Chief Complaint  Chief Complaint  Patient presents with  . Follow-up    6 month    HPI  Patient is a 79 yo Caucasian female in today for six-month followup. She's doing well. She denies any recent illness, congestion, fevers, chills, chest pain, palpitations, shortness of breath, GI or GU complaints. Her reflux is responding to Nexium. She's taking her medications as prescribed. Does have occasional right-sided headaches but these are stable since 2006 and without associated symptoms  Past Medical History  Diagnosis Date  . Paroxysmal a-fib   . Mitral and aortic regurgitation   . Mitral valve prolapse     hx of  . Hypertension   . Hyperlipidemia   . Vitamin D deficiency   . Pneumonia   . Tracheitis   . Gait difficulty   . Anemia   . Melena   . IBS (irritable bowel syndrome)   . Incontinence   . GERD (gastroesophageal reflux disease)   . Osteopenia   . Thyroid disease     hypo  . Personal history of colonic polyps   . Current use of long term anticoagulation   . Cough   . Diarrhea   . Pneumonia     organism unspecified  . Constipation   . UTI (lower urinary tract infection)   . Unspecified adverse effect of other drug, medicinal and biological substance(995.29)   . History of mammogram 2009  . Diverticulosis   . GERD (gastroesophageal reflux disease)   . Hyperlipidemia   . Atrial fibrillation   . Arthritis     hips, knees    Past Surgical History  Procedure Laterality Date  . Breast biopsy      left  . Cardiac electrophysiology mapping and ablation    . Abdominal hysterectomy      partial, ovaries left in place  . Cataract extraction, bilateral    . Esophagoscopy w/ botox injection      Family History  Problem Relation Age of Onset  . Diabetes Mother   . Stroke Mother   . COPD  Father   . Heart disease Maternal Aunt   . Heart disease Maternal Uncle   . Allergies Daughter   . COPD Daughter   . Stroke Daughter   . COPD Daughter     previous smoker  . Stroke Maternal Grandfather     History   Social History  . Marital Status: Widowed    Spouse Name: N/A    Number of Children: N/A  . Years of Education: N/A   Occupational History  . Not on file.   Social History Main Topics  . Smoking status: Never Smoker   . Smokeless tobacco: Never Used  . Alcohol Use: No  . Drug Use: No  . Sexually Active: No   Other Topics Concern  . Not on file   Social History Narrative  . No narrative on file    Current Outpatient Prescriptions on File Prior to Visit  Medication Sig Dispense Refill  . atorvastatin (LIPITOR) 10 MG tablet Take 1 tablet (10 mg total) by mouth daily.  30 tablet  1  . warfarin (COUMADIN) 2.5 MG tablet Take 1 tablet (2.5 mg total) by mouth daily.  30 tablet  2   No current facility-administered medications on file prior to visit.  Allergies  Allergen Reactions  . Darifenacin Hydrobromide     REACTION: causes her dizziness  . Sulfonamide Derivatives   . Tramadol Other (See Comments)    Insomnia, anorexia    Review of Systems  Review of Systems  Constitutional: Negative for fever and malaise/fatigue.  HENT: Negative for congestion.   Eyes: Negative for discharge.  Respiratory: Negative for shortness of breath.   Cardiovascular: Negative for chest pain, palpitations and leg swelling.  Gastrointestinal: Negative for nausea, abdominal pain and diarrhea.  Genitourinary: Negative for dysuria.  Musculoskeletal: Negative for falls.  Skin: Negative for rash.  Neurological: Negative for loss of consciousness and headaches.  Endo/Heme/Allergies: Negative for polydipsia.  Psychiatric/Behavioral: Negative for depression and suicidal ideas. The patient is not nervous/anxious and does not have insomnia.     Objective  BP 129/74  Pulse  88  Temp(Src) 98.1 F (36.7 C) (Temporal)  Ht 5' (1.524 m)  Wt 124 lb (56.246 kg)  BMI 24.22 kg/m2  SpO2 95%  Physical Exam  Physical Exam  Constitutional: She is oriented to person, place, and time and well-developed, well-nourished, and in no distress. No distress.  HENT:  Head: Normocephalic and atraumatic.  Eyes: Conjunctivae are normal.  Neck: Neck supple. No thyromegaly present.  Cardiovascular: Normal rate and regular rhythm.   Murmur heard. Pulmonary/Chest: Effort normal and breath sounds normal. She has no wheezes.  Abdominal: She exhibits no distension and no mass.  Musculoskeletal: She exhibits no edema.  Lymphadenopathy:    She has no cervical adenopathy.  Neurological: She is alert and oriented to person, place, and time.  Skin: Skin is warm and dry. No rash noted. She is not diaphoretic.  Psychiatric: Memory, affect and judgment normal.    Lab Results  Component Value Date   TSH 3.62 02/18/2012   Lab Results  Component Value Date   WBC 5.1 02/18/2012   HGB 11.4* 02/18/2012   HCT 34.3* 02/18/2012   MCV 92.9 02/18/2012   PLT 181.0 02/18/2012   Lab Results  Component Value Date   CREATININE 1.3* 02/18/2012   BUN 17 02/18/2012   NA 143 02/18/2012   K 4.4 02/18/2012   CL 109 02/18/2012   CO2 28 02/18/2012   Lab Results  Component Value Date   ALT 15 02/18/2012   AST 20 02/18/2012   ALKPHOS 14* 02/18/2012   BILITOT 0.4 02/18/2012   Lab Results  Component Value Date   CHOL 121 02/18/2012   Lab Results  Component Value Date   HDL 32.90* 02/18/2012   Lab Results  Component Value Date   LDLCALC 59 02/18/2012   Lab Results  Component Value Date   TRIG 145.0 02/18/2012   Lab Results  Component Value Date   CHOLHDL 4 02/18/2012     Assessment & Plan  HYPERTENSION Well controlled, no changes in meds today  HYPOTHYROIDISM tsh wnl  GERD Asymptomatic on nexium most of the time, avoid offending foods

## 2012-02-24 NOTE — Assessment & Plan Note (Signed)
tsh wnl

## 2012-02-24 NOTE — Assessment & Plan Note (Signed)
Asymptomatic on nexium most of the time, avoid offending foods

## 2012-03-05 ENCOUNTER — Encounter: Payer: Self-pay | Admitting: Family Medicine

## 2012-03-05 ENCOUNTER — Ambulatory Visit (INDEPENDENT_AMBULATORY_CARE_PROVIDER_SITE_OTHER): Payer: Medicare Other | Admitting: Family Medicine

## 2012-03-05 VITALS — BP 130/74 | HR 80 | Temp 99.4°F | Ht 60.0 in | Wt 122.0 lb

## 2012-03-05 DIAGNOSIS — J209 Acute bronchitis, unspecified: Secondary | ICD-10-CM | POA: Insufficient documentation

## 2012-03-05 NOTE — Progress Notes (Signed)
OFFICE NOTE  03/05/2012  CC:  Chief Complaint  Patient presents with  . URI    head/chest congestion, cough x 3 days; no fever     HPI: Patient is a 79 y.o. Caucasian female who is here for cough. Pt presents complaining of respiratory symptoms for 4 days.  Primary symptoms are: cough that was initially productive but now is not, lots of nasal congestion/nasal mucous.  No ST.  Worst symptoms seems to be the cough.  Lately the symptoms seem to be worsening.  Her right posterolat chest area was sore this morning. Pertinent negatives: No fevers, no wheezing, and no SOB.  No pain in face or teeth.    Symptoms made worse by nothing.  Sympto61ms improved by mucinex --some. Smoker? never Recent sick contact? Everyone in the house has had a cold (adults and children). Muscle or joint aches? no Flu shot this season at least 2 wks ago? yes  Additional ROS: no n/v/d or abdominal pain.  No rash.  No neck stiffness.   +Mild fatigue.  +Mild appetite loss.   Pertinent PMH:  Past Medical History  Diagnosis Date  . Paroxysmal a-fib   . Mitral and aortic regurgitation   . Mitral valve prolapse     hx of  . Hypertension   . Hyperlipidemia   . Vitamin D deficiency   . Pneumonia   . Tracheitis   . Gait difficulty   . Anemia   . Melena   . IBS (irritable bowel syndrome)   . Incontinence   . GERD (gastroesophageal reflux disease)   . Osteopenia   . Thyroid disease     hypo  . Personal history of colonic polyps   . Current use of long term anticoagulation   . Cough   . Diarrhea   . Pneumonia     organism unspecified  . Constipation   . UTI (lower urinary tract infection)   . Unspecified adverse effect of other drug, medicinal and biological substance(995.29)   . History of mammogram 2009  . Diverticulosis   . GERD (gastroesophageal reflux disease)   . Hyperlipidemia   . Atrial fibrillation   . Arthritis     hips, knees    MEDS:  Outpatient Prescriptions Prior to Visit   Medication Sig Dispense Refill  . ALPRAZolam (XANAX) 0.25 MG tablet Take 1 tablet (0.25 mg total) by mouth 3 (three) times daily. For stress  90 tablet  5  . atorvastatin (LIPITOR) 10 MG tablet Take 1 tablet (10 mg total) by mouth daily.  30 tablet  1  . Cholecalciferol (VITAMIN D) 2000 UNITS CAPS Take 1 capsule (2,000 Units total) by mouth daily.  30 capsule    . esomeprazole (NEXIUM) 40 MG capsule 1 cap AM and PM for reflux and cough  180 capsule  3  . estradiol (ESTRACE) 0.5 MG tablet Take 0.5 tablets (0.25 mg total) by mouth daily.  90 tablet  3  . hydrochlorothiazide (HYDRODIURIL) 25 MG tablet Take 1 tablet (25 mg total) by mouth daily.  90 tablet  3  . metoprolol tartrate (LOPRESSOR) 25 MG tablet Take 0.5 tablets (12.5 mg total) by mouth 2 (two) times daily. bid  90 tablet  3  . ramipril (ALTACE) 10 MG capsule Take 1 capsule (10 mg total) by mouth daily.  90 capsule  3  . vitamin B-12 (CYANOCOBALAMIN) 1000 MCG tablet Take 1 tablet (1,000 mcg total) by mouth daily.      Marland Kitchen warfarin (COUMADIN) 2.5 MG  tablet Take 1 tablet (2.5 mg total) by mouth daily.  30 tablet  2   No facility-administered medications prior to visit.    PE: Blood pressure 130/74, pulse 80, temperature 99.4 F (37.4 C), temperature source Temporal, height 5' (1.524 m), weight 122 lb (55.339 kg), SpO2 94.00%. Gen: Alert, well appearing.  Patient is oriented to person, place, time, and situation. ENT: Ears: EACs clear, normal epithelium.  TMs with good light reflex and landmarks bilaterally.  Eyes: no injection, icteris, swelling, or exudate.  EOMI, PERRLA. Nose: no drainage or turbinate edema/swelling.  No injection or focal lesion.  Mouth: lips without lesion/swelling.  Oral mucosa pink and moist.  Dentition intact and without obvious caries or gingival swelling.  Oropharynx without erythema, exudate, or swelling.  Neck - No masses or thyromegaly or limitation in range of motion CV: RRR, no m/r/g.   LUNGS: CTA bilat,  nonlabored resps, good aeration in all lung fields. EXT: no clubbing orcyanosis.  Trace pitting edema bilat in anterior tibial regions (R>L).    IMPRESSION AND PLAN:  Acute bronchitis Vs viral URI with PND. At any rate I think this is a viral illness. Reassured pt. Encouraged her to continue using mucinex (make sure it is DM) and to add saline nasal spray. Signs/symptoms to call or return for were reviewed and pt expressed understanding.    An After Visit Summary was printed and given to the patient.  FOLLOW UP: prn

## 2012-03-05 NOTE — Assessment & Plan Note (Signed)
Vs viral URI with PND. At any rate I think this is a viral illness. Reassured pt. Encouraged her to continue using mucinex (make sure it is DM) and to add saline nasal spray. Signs/symptoms to call or return for were reviewed and pt expressed understanding.

## 2012-03-05 NOTE — Patient Instructions (Signed)
Buy OTC mucinex DM and follow dosing instructions on box. Buy OTC saline nasal spray (generic) and use 2-3 sprays in each nostril 3-4 times per day for nasal irrigation and moisturization.

## 2012-03-14 ENCOUNTER — Telehealth: Payer: Self-pay

## 2012-03-14 NOTE — Telephone Encounter (Signed)
Per MD Insurance wants [place] to make pt aware of risks of blood clots as well as some cancers with Estradiol. I informed patient and she stated she is not taking this medication and doesn't want to

## 2012-03-17 ENCOUNTER — Emergency Department (HOSPITAL_BASED_OUTPATIENT_CLINIC_OR_DEPARTMENT_OTHER)
Admission: EM | Admit: 2012-03-17 | Discharge: 2012-03-17 | Disposition: A | Discharge status: Home / Self Care | Payer: Medicare Other | Attending: Emergency Medicine | Admitting: Emergency Medicine

## 2012-03-17 ENCOUNTER — Emergency Department (HOSPITAL_BASED_OUTPATIENT_CLINIC_OR_DEPARTMENT_OTHER): Payer: Medicare Other

## 2012-03-17 ENCOUNTER — Encounter (HOSPITAL_BASED_OUTPATIENT_CLINIC_OR_DEPARTMENT_OTHER): Payer: Self-pay | Admitting: *Deleted

## 2012-03-17 DIAGNOSIS — Z8709 Personal history of other diseases of the respiratory system: Secondary | ICD-10-CM | POA: Insufficient documentation

## 2012-03-17 DIAGNOSIS — Z8701 Personal history of pneumonia (recurrent): Secondary | ICD-10-CM | POA: Insufficient documentation

## 2012-03-17 DIAGNOSIS — Z87440 Personal history of urinary (tract) infections: Secondary | ICD-10-CM | POA: Insufficient documentation

## 2012-03-17 DIAGNOSIS — J329 Chronic sinusitis, unspecified: Secondary | ICD-10-CM | POA: Insufficient documentation

## 2012-03-17 DIAGNOSIS — Z7901 Long term (current) use of anticoagulants: Secondary | ICD-10-CM | POA: Insufficient documentation

## 2012-03-17 DIAGNOSIS — Z862 Personal history of diseases of the blood and blood-forming organs and certain disorders involving the immune mechanism: Secondary | ICD-10-CM | POA: Insufficient documentation

## 2012-03-17 DIAGNOSIS — R059 Cough, unspecified: Secondary | ICD-10-CM | POA: Insufficient documentation

## 2012-03-17 DIAGNOSIS — I10 Essential (primary) hypertension: Secondary | ICD-10-CM | POA: Insufficient documentation

## 2012-03-17 DIAGNOSIS — Z8679 Personal history of other diseases of the circulatory system: Secondary | ICD-10-CM | POA: Insufficient documentation

## 2012-03-17 DIAGNOSIS — R51 Headache: Secondary | ICD-10-CM | POA: Insufficient documentation

## 2012-03-17 DIAGNOSIS — J069 Acute upper respiratory infection, unspecified: Secondary | ICD-10-CM | POA: Insufficient documentation

## 2012-03-17 DIAGNOSIS — Z79899 Other long term (current) drug therapy: Secondary | ICD-10-CM | POA: Insufficient documentation

## 2012-03-17 DIAGNOSIS — Z8739 Personal history of other diseases of the musculoskeletal system and connective tissue: Secondary | ICD-10-CM | POA: Insufficient documentation

## 2012-03-17 DIAGNOSIS — E785 Hyperlipidemia, unspecified: Secondary | ICD-10-CM | POA: Insufficient documentation

## 2012-03-17 DIAGNOSIS — Z860100 Personal history of colon polyps, unspecified: Secondary | ICD-10-CM | POA: Insufficient documentation

## 2012-03-17 DIAGNOSIS — Z8719 Personal history of other diseases of the digestive system: Secondary | ICD-10-CM | POA: Insufficient documentation

## 2012-03-17 DIAGNOSIS — K219 Gastro-esophageal reflux disease without esophagitis: Secondary | ICD-10-CM | POA: Insufficient documentation

## 2012-03-17 DIAGNOSIS — R000 Tachycardia, unspecified: Secondary | ICD-10-CM | POA: Insufficient documentation

## 2012-03-17 DIAGNOSIS — Z9189 Other specified personal risk factors, not elsewhere classified: Secondary | ICD-10-CM | POA: Insufficient documentation

## 2012-03-17 DIAGNOSIS — I4891 Unspecified atrial fibrillation: Secondary | ICD-10-CM | POA: Insufficient documentation

## 2012-03-17 DIAGNOSIS — D649 Anemia, unspecified: Secondary | ICD-10-CM | POA: Insufficient documentation

## 2012-03-17 DIAGNOSIS — Z87448 Personal history of other diseases of urinary system: Secondary | ICD-10-CM | POA: Insufficient documentation

## 2012-03-17 DIAGNOSIS — E559 Vitamin D deficiency, unspecified: Secondary | ICD-10-CM | POA: Insufficient documentation

## 2012-03-17 DIAGNOSIS — Z8639 Personal history of other endocrine, nutritional and metabolic disease: Secondary | ICD-10-CM | POA: Insufficient documentation

## 2012-03-17 DIAGNOSIS — J3489 Other specified disorders of nose and nasal sinuses: Secondary | ICD-10-CM | POA: Insufficient documentation

## 2012-03-17 MED ORDER — AMOXICILLIN-POT CLAVULANATE 875-125 MG PO TABS: 1.0000 | ORAL_TABLET | Freq: Two times a day (BID) | ORAL | Status: DC

## 2012-03-17 NOTE — ED Notes (Signed)
Patient transported to CT 

## 2012-03-17 NOTE — ED Notes (Signed)
Returned from CT.

## 2012-03-17 NOTE — ED Provider Notes (Signed)
History     CSN: [id number]  Arrival date & time 03/17/12  1120   First MD Initiated Contact with Patient 03/17/12 1139      Chief Complaint  Patient presents with  . Headache    (Consider location/radiation/quality/duration/timing/severity/associated sxs/prior treatment) Patient is a 79 y.o. female presenting with headaches. The history is provided by the patient.  Headache Pain location:  Frontal Quality:  Stabbing and dull Radiates to:  Face Severity currently:  8/10 Severity at highest:  8/10 Onset quality:  Gradual Duration:  2 weeks Timing:  Constant Progression:  Worsening Chronicity:  Recurrent ( patient has  a  loong hhisttory of headaches and was seeing  [facility] in 2005. However she states the headaches have gotten better until the last 2 weeks thheeyy  hhaavvee  mmoovved to both sides of her face after having URI symptoms) Similar to prior headaches: yes   Context: coughing   Context: not exposure to bright light, not eating and not loud noise   Relieved by:  Nothing Worsened by:  Nothing tried Associated symptoms: congestion, cough, sinus pressure and URI   Associated symptoms: no abdominal pain, no fever, no focal weakness, no hearing loss, no loss of balance, no nausea, no neck pain, no paresthesias, no photophobia, no vomiting and no weakness     Past Medical History  Diagnosis Date  . Paroxysmal a-fib   . Mitral and aortic regurgitation   . Mitral valve prolapse     hx of  . Hypertension   . Hyperlipidemia   . Vitamin D deficiency   . Pneumonia   . Tracheitis   . Gait difficulty   . Anemia   . Melena   . IBS (irritable bowel syndrome)   . Incontinence   . GERD (gastroesophageal reflux disease)   . Osteopenia   . Thyroid disease     hypo  . Personal history of colonic polyps   . Current use of long term anticoagulation   . Cough   . Diarrhea   . Pneumonia     organism unspecified  . Constipation   . UTI (lower urinary  tract infection)   . Unspecified adverse effect of other drug, medicinal and biological substance(995.29)   . History of mammogram 2009  . Diverticulosis   . GERD (gastroesophageal reflux disease)   . Hyperlipidemia   . Atrial fibrillation   . Arthritis     hips, knees    Past Surgical History  Procedure Laterality Date  . Breast biopsy      left  . Cardiac electrophysiology mapping and ablation    . Abdominal hysterectomy      partial, ovaries left in place  . Cataract extraction, bilateral    . Esophagoscopy w/ botox injection      Family History  Problem Relation Age of Onset  . Diabetes Mother   . Stroke Mother   . COPD Father   . Heart disease Maternal Aunt   . Heart disease Maternal Uncle   . Allergies Daughter   . COPD Daughter   . Stroke Daughter   . COPD Daughter     previous smoker  . Stroke Maternal Grandfather     History  Substance Use Topics  . Smoking status: Never Smoker   . Smokeless tobacco: Never Used  . Alcohol Use: No    OB History   Grav Para Term Preterm Abortions TAB SAB Ect Mult Living  Review of Systems  Constitutional: Negative for fever.  HENT: Positive for congestion and sinus pressure. Negative for hearing loss and neck pain.   Eyes: Negative for photophobia.  Respiratory: Positive for cough.   Gastrointestinal: Negative for nausea, vomiting and abdominal pain.  Neurological: Positive for headaches. Negative for focal weakness, weakness, paresthesias and loss of balance.  All other systems reviewed and are negative.    Allergies  Darifenacin hydrobromide; Sulfonamide derivatives; and Tramadol  Home Medications   Current Outpatient Rx  Name  Route  Sig  Dispense  Refill  . ALPRAZolam (XANAX) 0.25 MG tablet   Oral   Take 1 tablet (0.25 mg total) by mouth 3 (three) times daily. For stress   90 tablet   5   . atorvastatin (LIPITOR) 10 MG tablet   Oral   Take 1 tablet (10 mg total) by mouth daily.    30 tablet   1   . Cholecalciferol (VITAMIN D) 2000 UNITS CAPS   Oral   Take 1 capsule (2,000 Units total) by mouth daily.   30 capsule      . esomeprazole (NEXIUM) 40 MG capsule      1 cap AM and PM for reflux and cough   180 capsule   3   . estradiol (ESTRACE) 0.5 MG tablet   Oral   Take 0.5 tablets (0.25 mg total) by mouth daily.   90 tablet   3   . hydrochlorothiazide (HYDRODIURIL) 25 MG tablet   Oral   Take 1 tablet (25 mg total) by mouth daily.   90 tablet   3   . metoprolol tartrate (LOPRESSOR) 25 MG tablet   Oral   Take 0.5 tablets (12.5 mg total) by mouth 2 (two) times daily. bid   90 tablet   3   . ramipril (ALTACE) 10 MG capsule   Oral   Take 1 capsule (10 mg total) by mouth daily.   90 capsule   3   . vitamin B-12 (CYANOCOBALAMIN) 1000 MCG tablet   Oral   Take 1 tablet (1,000 mcg total) by mouth daily.         Marland Kitchen warfarin (COUMADIN) 2.5 MG tablet   Oral   Take 1 tablet (2.5 mg total) by mouth daily.   30 tablet   2     BP 118/76  Pulse 114  Temp(Src) 97.4 F (36.3 C) (Oral)  Resp 20  Wt 122 lb (55.339 kg)  BMI 23.83 kg/m2  SpO2 97%  Physical Exam  Nursing note and vitals reviewed. Constitutional: She is oriented to person, place, and time. She appears well-developed and well-nourished. No distress.  HENT:  Head: Normocephalic and atraumatic.  Nose: Mucosal edema present. Right sinus exhibits frontal sinus tenderness. Right sinus exhibits no maxillary sinus tenderness. Left sinus exhibits frontal sinus tenderness. Left sinus exhibits no maxillary sinus tenderness.  Eyes: EOM are normal. Pupils are equal, round, and reactive to light.  Fundoscopic exam:      The right eye shows no papilledema.       The left eye shows no papilledema.  Neck: Normal range of motion. Neck supple.  Cardiovascular: Normal heart sounds and intact distal pulses.  An irregularly irregular rhythm present. Tachycardia present.  Exam reveals no friction rub.   No  murmur heard. Pulmonary/Chest: Effort normal and breath sounds normal. She has no wheezes. She has no rales.  Abdominal: Soft. Bowel sounds are normal. She exhibits no distension. There is no tenderness. There is  no rebound and no guarding.  Musculoskeletal: Normal range of motion. She exhibits no tenderness.  No edema  Lymphadenopathy:    She has no cervical adenopathy.  Neurological: She is alert and oriented to person, place, and time. She has normal strength. No cranial nerve deficit or sensory deficit. Coordination and gait normal.  No photophobia  Skin: Skin is warm and dry. No rash noted.  Psychiatric: She has a normal mood and affect. Her behavior is normal.    ED Course  Procedures (including critical care time)  Labs Reviewed - No data to display Ct Head Wo Contrast  03/17/2012  *RADIOLOGY REPORT*  Clinical Data:  Headache for the past 4 months.  Sinus pressure.  CT HEAD WITHOUT CONTRAST CT MAXILLOFACIAL WITHOUT CONTRAST  Technique:  Multidetector CT imaging of the head and maxillofacial structures were performed using the standard protocol without intravenous contrast. Multiplanar CT image reconstructions of the maxillofacial structures were also generated.  Comparison:  Head CT 03/15/2007.  CT HEAD  Findings: Mild cerebral and cerebellar atrophy is age appropriate. Patchy and confluent areas of decreased attenuation throughout the deep and periventricular white matter of the cerebral hemispheres bilaterally is compatible with chronic microvascular ischemic disease.  Old lacunar infarctions are noted in the basal ganglia bilaterally, particularly the anterior aspect of the left putamen. Physiologic calcifications are noted in the basal ganglia bilaterally.  Old lacunar infarct in the right cerebellar hemisphere is also unchanged.  No acute intracranial abnormalities. Specifically, no definite signs of acute/subacute cerebral ischemia, no acute intracranial hemorrhage, no mass and no  hydrocephalous.  No acute displaced skull fractures are identified. There is extensive mucoperiosteal thickening throughout the frontal sinuses, ethmoid sinuses and sphenoid sinuses bilaterally, as well as right maxillary sinus.  No air fluid level is noted in the left sphenoid sinus.  Mastoids are well pneumatized bilaterally.  IMPRESSION: 1.  No acute intracranial abnormalities. 2.  Extensive paranasal sinus disease with both chronic and acute features, as above.  This may account for the patient's history of headache. 3.  Mild cerebral and cerebellar atrophy with extensive chronic microvascular ischemic changes and old lacunar infarctions, as above.  CT MAXILLOFACIAL  Findings:   As mentioned above, there is extensive mucoperiosteal thickening throughout the paranasal sinuses, as well as an air- fluid level in the left sphenoid sinus, compatible with acute sinusitis superimposed upon chronic sinusitis.  No acute displaced facial bone fractures are identified.  The mandibular condyles are located bilaterally.  The globes and retro-orbital soft tissues are unremarkable in appearance.  IMPRESSION: 1.  Findings, as above, compatible with acute sinusitis superimposed upon a background of chronic sinus disease.   Original Report Authenticated By: Trudie Reed, M.D.    Ct Maxillofacial Wo Cm  03/17/2012  *RADIOLOGY REPORT*  Clinical Data:  Headache for the past 4 months.  Sinus pressure.  CT HEAD WITHOUT CONTRAST CT MAXILLOFACIAL WITHOUT CONTRAST  Technique:  Multidetector CT imaging of the head and maxillofacial structures were performed using the standard protocol without intravenous contrast. Multiplanar CT image reconstructions of the maxillofacial structures were also generated.  Comparison:  Head CT 03/15/2007.  CT HEAD  Findings: Mild cerebral and cerebellar atrophy is age appropriate. Patchy and confluent areas of decreased attenuation throughout the deep and periventricular white matter of the cerebral  hemispheres bilaterally is compatible with chronic microvascular ischemic disease.  Old lacunar infarctions are noted in the basal ganglia bilaterally, particularly the anterior aspect of the left putamen. Physiologic calcifications are noted in the  basal ganglia bilaterally.  Old lacunar infarct in the right cerebellar hemisphere is also unchanged.  No acute intracranial abnormalities. Specifically, no definite signs of acute/subacute cerebral ischemia, no acute intracranial hemorrhage, no mass and no hydrocephalous.  No acute displaced skull fractures are identified. There is extensive mucoperiosteal thickening throughout the frontal sinuses, ethmoid sinuses and sphenoid sinuses bilaterally, as well as right maxillary sinus.  No air fluid level is noted in the left sphenoid sinus.  Mastoids are well pneumatized bilaterally.  IMPRESSION: 1.  No acute intracranial abnormalities. 2.  Extensive paranasal sinus disease with both chronic and acute features, as above.  This may account for the patient's history of headache. 3.  Mild cerebral and cerebellar atrophy with extensive chronic microvascular ischemic changes and old lacunar infarctions, as above.  CT MAXILLOFACIAL  Findings:   As mentioned above, there is extensive mucoperiosteal thickening throughout the paranasal sinuses, as well as an air- fluid level in the left sphenoid sinus, compatible with acute sinusitis superimposed upon chronic sinusitis.  No acute displaced facial bone fractures are identified.  The mandibular condyles are located bilaterally.  The globes and retro-orbital soft tissues are unremarkable in appearance.  IMPRESSION: 1.  Findings, as above, compatible with acute sinusitis superimposed upon a background of chronic sinus disease.   Original Report Authenticated By: Trudie Reed, M.D.      1. Sinusitis   2. Headache       MDM   Patient with a long history of headaches since 2005 who was seeing a headache wellness specialists  in 2005 but has not seen them recently. She states over the last 2 weeks her headaches got much worse after she had a cold and sinus symptoms. No headaches and moved to the left side of her head as well. She denies any nausea, vomiting, photophobia or neurologic changes. She denies any recent falls. She is on Coumadin for A. Fib.  Concern for possible sinusitis causing her worsening headaches today. CT of the head rules out any subdural hematoma, space occupying lesion or other abnormalities. CT of the sinuses show an acute on chronic sinus disease which is most likely the cause for her headache.  She denies any recent medication changes. She is otherwise well-appearing and has normal vital signs. Will treat with Augmentin for the sinus disease and she already uses a nasal spray. Will have her followup with her Dr. on Friday as planned.        Gwyneth Sprout, MD 03/17/12 1313

## 2012-03-17 NOTE — ED Notes (Signed)
Headache x 4 months.

## 2012-03-21 ENCOUNTER — Telehealth: Payer: Self-pay | Admitting: Family Medicine

## 2012-03-21 MED ORDER — WARFARIN SODIUM 2.5 MG PO TABS: 2.5000 mg | ORAL_TABLET | Freq: Every day | ORAL | Status: DC

## 2012-03-21 NOTE — Telephone Encounter (Signed)
Refill sent.

## 2012-03-21 NOTE — Telephone Encounter (Signed)
Refill- warfarin 2.5mg  tab. Take one tablet by mouth one time daily.

## 2012-04-03 ENCOUNTER — Ambulatory Visit (INDEPENDENT_AMBULATORY_CARE_PROVIDER_SITE_OTHER): Payer: Medicare Other

## 2012-04-03 DIAGNOSIS — I4891 Unspecified atrial fibrillation: Secondary | ICD-10-CM

## 2012-04-03 DIAGNOSIS — Z7901 Long term (current) use of anticoagulants: Secondary | ICD-10-CM

## 2012-04-13 ENCOUNTER — Other Ambulatory Visit: Payer: Self-pay | Admitting: Family Medicine

## 2012-04-16 ENCOUNTER — Other Ambulatory Visit (INDEPENDENT_AMBULATORY_CARE_PROVIDER_SITE_OTHER): Payer: Medicare Other

## 2012-04-16 DIAGNOSIS — Z7901 Long term (current) use of anticoagulants: Secondary | ICD-10-CM

## 2012-04-16 DIAGNOSIS — I4891 Unspecified atrial fibrillation: Secondary | ICD-10-CM

## 2012-04-16 DIAGNOSIS — E538 Deficiency of other specified B group vitamins: Secondary | ICD-10-CM

## 2012-04-16 DIAGNOSIS — E559 Vitamin D deficiency, unspecified: Secondary | ICD-10-CM

## 2012-04-16 NOTE — Progress Notes (Signed)
Labs only

## 2012-04-17 LAB — HOMOCYSTEINE: Homocysteine: 18.5 umol/L — ABNORMAL HIGH (ref 4.0–15.4)

## 2012-04-21 ENCOUNTER — Encounter: Payer: Self-pay | Admitting: *Deleted

## 2012-05-11 ENCOUNTER — Other Ambulatory Visit: Payer: Self-pay | Admitting: Family Medicine

## 2012-05-16 ENCOUNTER — Ambulatory Visit (INDEPENDENT_AMBULATORY_CARE_PROVIDER_SITE_OTHER): Payer: Medicare Other

## 2012-05-16 DIAGNOSIS — I4891 Unspecified atrial fibrillation: Secondary | ICD-10-CM

## 2012-05-16 DIAGNOSIS — Z7901 Long term (current) use of anticoagulants: Secondary | ICD-10-CM

## 2012-05-16 LAB — POCT INR: INR: 2

## 2012-06-08 ENCOUNTER — Other Ambulatory Visit: Payer: Self-pay | Admitting: Family Medicine

## 2012-06-13 ENCOUNTER — Telehealth: Payer: Self-pay | Admitting: Cardiology

## 2012-06-13 NOTE — Telephone Encounter (Signed)
New problem   Pt wants to know who her new cardiologist will be/pt was not on the list

## 2012-06-13 NOTE — Telephone Encounter (Signed)
Spoke with patient and advise Dr Daleen Squibb and his nurse Minda Ditto RN out of the office but would forward to them. Advised they would be in touch soon.

## 2012-06-16 NOTE — Telephone Encounter (Signed)
Dr Nelson

## 2012-06-18 NOTE — Telephone Encounter (Signed)
Pt aware Dr. Delton See will be her new cardiologist. She is not due for her yearly until September Mylo Red RN

## 2012-06-20 ENCOUNTER — Encounter: Payer: Self-pay | Admitting: Family Medicine

## 2012-06-20 ENCOUNTER — Ambulatory Visit: Payer: Medicare Other | Admitting: Family Medicine

## 2012-06-20 ENCOUNTER — Ambulatory Visit (INDEPENDENT_AMBULATORY_CARE_PROVIDER_SITE_OTHER): Payer: Medicare Other | Admitting: Family Medicine

## 2012-06-20 VITALS — BP 142/52 | HR 60 | Temp 97.8°F | Ht 60.0 in | Wt 122.1 lb

## 2012-06-20 DIAGNOSIS — I4891 Unspecified atrial fibrillation: Secondary | ICD-10-CM

## 2012-06-20 DIAGNOSIS — F411 Generalized anxiety disorder: Secondary | ICD-10-CM

## 2012-06-20 DIAGNOSIS — Z7901 Long term (current) use of anticoagulants: Secondary | ICD-10-CM

## 2012-06-20 DIAGNOSIS — K222 Esophageal obstruction: Secondary | ICD-10-CM

## 2012-06-20 DIAGNOSIS — R1310 Dysphagia, unspecified: Secondary | ICD-10-CM

## 2012-06-20 DIAGNOSIS — K219 Gastro-esophageal reflux disease without esophagitis: Secondary | ICD-10-CM

## 2012-06-20 DIAGNOSIS — J209 Acute bronchitis, unspecified: Secondary | ICD-10-CM

## 2012-06-20 DIAGNOSIS — F419 Anxiety disorder, unspecified: Secondary | ICD-10-CM

## 2012-06-20 DIAGNOSIS — I10 Essential (primary) hypertension: Secondary | ICD-10-CM

## 2012-06-20 DIAGNOSIS — R51 Headache: Secondary | ICD-10-CM

## 2012-06-20 DIAGNOSIS — E785 Hyperlipidemia, unspecified: Secondary | ICD-10-CM

## 2012-06-20 DIAGNOSIS — E538 Deficiency of other specified B group vitamins: Secondary | ICD-10-CM

## 2012-06-20 LAB — LIPID PANEL
Cholesterol: 156 mg/dL (ref 0–200)
HDL: 36 mg/dL — ABNORMAL LOW (ref >39)
LDL Cholesterol: 81 mg/dL (ref 0–99)
Triglycerides: 197 mg/dL — ABNORMAL HIGH (ref <150)

## 2012-06-20 LAB — RENAL FUNCTION PANEL
Albumin: 4 g/dL (ref 3.5–5.2)
Calcium: 9.4 mg/dL (ref 8.4–10.5)
Phosphorus: 5.4 mg/dL — ABNORMAL HIGH (ref 2.3–4.6)
Potassium: 4.2 mEq/L (ref 3.5–5.3)
Sodium: 140 mEq/L (ref 135–145)

## 2012-06-20 LAB — HEPATIC FUNCTION PANEL
Bilirubin, Direct: 0.1 mg/dL (ref 0.0–0.3)
Indirect Bilirubin: 0.4 mg/dL (ref 0.0–0.9)
Total Protein: 7 g/dL (ref 6.0–8.3)

## 2012-06-20 MED ORDER — ALPRAZOLAM 0.25 MG PO TABS: 0.2500 mg | ORAL_TABLET | Freq: Three times a day (TID) | ORAL | Status: DC

## 2012-06-20 NOTE — Patient Instructions (Addendum)
Probiotic daily, such as Digestive Advantage daily Chew Tums at bedtime Gastroesophageal Reflux Disease, Adult Gastroesophageal reflux disease (GERD) happens when acid from your stomach flows up into the esophagus. When acid comes in contact with the esophagus, the acid causes soreness (inflammation) in the esophagus. Over time, GERD may create small holes (ulcers) in the lining of the esophagus. CAUSES   Increased body weight. This puts pressure on the stomach, making acid rise from the stomach into the esophagus.  Smoking. This increases acid production in the stomach.  Drinking alcohol. This causes decreased pressure in the lower esophageal sphincter (valve or ring of muscle between the esophagus and stomach), allowing acid from the stomach into the esophagus.  Late evening meals and a full stomach. This increases pressure and acid production in the stomach.  A malformed lower esophageal sphincter. Sometimes, no cause is found. SYMPTOMS   Burning pain in the lower part of the mid-chest behind the breastbone and in the mid-stomach area. This may occur twice a week or more often.  Trouble swallowing.  Sore throat.  Dry cough.  Asthma-like symptoms including chest tightness, shortness of breath, or wheezing. DIAGNOSIS  Your caregiver may be able to diagnose GERD based on your symptoms. In some cases, X-rays and other tests may be done to check for complications or to check the condition of your stomach and esophagus. TREATMENT  Your caregiver may recommend over-the-counter or prescription medicines to help decrease acid production. Ask your caregiver before starting or adding any new medicines.  HOME CARE INSTRUCTIONS   Change the factors that you can control. Ask your caregiver for guidance concerning weight loss, quitting smoking, and alcohol consumption.  Avoid foods and drinks that make your symptoms worse, such as:  Caffeine or alcoholic drinks.  Chocolate.  Peppermint  or mint flavorings.  Garlic and onions.  Spicy foods.  Citrus fruits, such as oranges, lemons, or limes.  Tomato-based foods such as sauce, chili, salsa, and pizza.  Fried and fatty foods.  Avoid lying down for the 3 hours prior to your bedtime or prior to taking a nap.  Eat small, frequent meals instead of large meals.  Wear loose-fitting clothing. Do not wear anything tight around your waist that causes pressure on your stomach.  Raise the head of your bed 6 to 8 inches with wood blocks to help you sleep. Extra pillows will not help.  Only take over-the-counter or prescription medicines for pain, discomfort, or fever as directed by your caregiver.  Do not take aspirin, ibuprofen, or other nonsteroidal anti-inflammatory drugs (NSAIDs). SEEK IMMEDIATE MEDICAL CARE IF:   You have pain in your arms, neck, jaw, teeth, or back.  Your pain increases or changes in intensity or duration.  You develop nausea, vomiting, or sweating (diaphoresis).  You develop shortness of breath, or you faint.  Your vomit is green, yellow, black, or looks like coffee grounds or blood.  Your stool is red, bloody, or black. These symptoms could be signs of other problems, such as heart disease, gastric bleeding, or esophageal bleeding. MAKE SURE YOU:   Understand these instructions.  Will watch your condition.  Will get help right away if you are not doing well or get worse. Document Released: 09/27/2004 Document Revised: 03/12/2011 Document Reviewed: 07/07/2010 [facility] Patient Information 2014 [place], [place].

## 2012-06-22 ENCOUNTER — Encounter: Payer: Self-pay | Admitting: Family Medicine

## 2012-06-22 DIAGNOSIS — R519 Headache, unspecified: Secondary | ICD-10-CM | POA: Insufficient documentation

## 2012-06-22 DIAGNOSIS — R51 Headache: Secondary | ICD-10-CM

## 2012-06-22 HISTORY — DX: Headache: R51

## 2012-06-22 NOTE — Assessment & Plan Note (Signed)
Stable dosing of coumadin. Doing well

## 2012-06-22 NOTE — Assessment & Plan Note (Signed)
RRR today 

## 2012-06-22 NOTE — Progress Notes (Signed)
Patient ID: [name] [name], female   DOB: [birth year], 79 y.o.   MRN: [id number] [name] [id number] 1924-03-20 06/22/2012      Progress Note-Follow Up  Subjective  Chief Complaint  Chief Complaint  Patient presents with  . Follow-up    4 month    HPI  Patient is an 79 year old Caucasian female who is in today for followup. She had a bronchitis and sinusitis earlier this year but it is resolved. She's complaining of some worsening headaches this: With neurology for this. Headaches are largely right-sided and central year. They come and go. There is no hearing loss or tinnitus associated. No fevers or chills. No congestion, sore throat, chest pain or palpitations are noted. This patient denies shortness of breath GI or GU complaints. Does acknowledge low appetite but does eat regular meals. Taking medications as prescribed.  Past Medical History  Diagnosis Date  . Paroxysmal a-fib   . Mitral and aortic regurgitation   . Mitral valve prolapse     hx of  . Hypertension   . Hyperlipidemia   . Vitamin D deficiency   . Pneumonia   . Tracheitis   . Gait difficulty   . Anemia   . Melena   . IBS (irritable bowel syndrome)   . Incontinence   . GERD (gastroesophageal reflux disease)   . Osteopenia   . Thyroid disease     hypo  . Personal history of colonic polyps   . Current use of long term anticoagulation   . Cough   . Diarrhea   . Pneumonia     organism unspecified  . Constipation   . UTI (lower urinary tract infection)   . Unspecified adverse effect of other drug, medicinal and biological substance(995.29)   . History of mammogram 2009  . Diverticulosis   . GERD (gastroesophageal reflux disease)   . Hyperlipidemia   . Atrial fibrillation   . Arthritis     hips, knees  . VQQVZDGL(875.6) 06/22/2012    Past Surgical History  Procedure Laterality Date  . Breast biopsy      left  . Cardiac electrophysiology mapping and ablation    . Abdominal hysterectomy     partial, ovaries left in place  . Cataract extraction, bilateral    . Esophagoscopy w/ botox injection      Family History  Problem Relation Age of Onset  . Diabetes Mother   . Stroke Mother   . COPD Father   . Heart disease Maternal Aunt   . Heart disease Maternal Uncle   . Allergies Daughter   . COPD Daughter   . Stroke Daughter   . COPD Daughter     previous smoker  . Stroke Maternal Grandfather     History   Social History  . Marital Status: Widowed    Spouse Name: N/A    Number of Children: N/A  . Years of Education: N/A   Occupational History  . Not on file.   Social History Main Topics  . Smoking status: Never Smoker   . Smokeless tobacco: Never Used  . Alcohol Use: No  . Drug Use: No  . Sexually Active: No   Other Topics Concern  . Not on file   Social History Narrative  . No narrative on file    Current Outpatient Prescriptions on File Prior to Visit  Medication Sig Dispense Refill  . atorvastatin (LIPITOR) 10 MG tablet Take 1 tablet (10 mg total) by mouth daily.  30 tablet  5  . Cholecalciferol (VITAMIN D) 2000 UNITS CAPS Take 1 capsule (2,000 Units total) by mouth daily.  30 capsule    . esomeprazole (NEXIUM) 40 MG capsule 1 cap AM and PM for reflux and cough  180 capsule  3  . estradiol (ESTRACE) 0.5 MG tablet Take 0.5 tablets (0.25 mg total) by mouth daily.  90 tablet  3  . hydrochlorothiazide (HYDRODIURIL) 25 MG tablet Take 1 tablet (25 mg total) by mouth daily.  90 tablet  3  . metoprolol tartrate (LOPRESSOR) 25 MG tablet Take 0.5 tablets (12.5 mg total) by mouth 2 (two) times daily. bid  90 tablet  3  . ramipril (ALTACE) 10 MG capsule Take 1 capsule (10 mg total) by mouth daily.  90 capsule  3  . vitamin B-12 (CYANOCOBALAMIN) 1000 MCG tablet Take 1 tablet (1,000 mcg total) by mouth daily.      Marland Kitchen warfarin (COUMADIN) 2.5 MG tablet Take 1 tablet (2.5 mg total) by mouth daily.  30 tablet  1   No current facility-administered medications on file  prior to visit.    Allergies  Allergen Reactions  . Darifenacin Hydrobromide     REACTION: causes her dizziness  . Sulfonamide Derivatives   . Tramadol Other (See Comments)    Insomnia, anorexia    Review of Systems  Review of Systems  Constitutional: Negative for fever and malaise/fatigue.  HENT: Negative for nosebleeds, congestion, sore throat and tinnitus.   Eyes: Negative for blurred vision, double vision and discharge.  Respiratory: Negative for shortness of breath.   Cardiovascular: Negative for chest pain, palpitations and leg swelling.  Gastrointestinal: Negative for nausea, abdominal pain and diarrhea.  Genitourinary: Negative for dysuria.  Musculoskeletal: Negative for falls.  Skin: Negative for rash.  Neurological: Positive for headaches. Negative for loss of consciousness.  Endo/Heme/Allergies: Negative for polydipsia.  Psychiatric/Behavioral: Negative for depression and suicidal ideas. The patient is not nervous/anxious and does not have insomnia.     Objective  BP 142/52  Pulse 60  Temp(Src) 97.8 F (36.6 C) (Oral)  Ht 5' (1.524 m)  Wt 122 lb 1.3 oz (55.375 kg)  BMI 23.84 kg/m2  SpO2 94%  Physical Exam  Physical Exam  Constitutional: She is well-developed, well-nourished, and in no distress. No distress.  HENT:  Head: Normocephalic and atraumatic.  Right Ear: External ear normal.  Left Ear: External ear normal.  Nose: Nose normal.  Mouth/Throat: No oropharyngeal exudate.  Eyes: EOM are normal. Left eye exhibits no discharge. No scleral icterus.  Neck: Normal range of motion. Neck supple. No JVD present. No tracheal deviation present. No thyromegaly present.  Cardiovascular: Normal rate, regular rhythm and intact distal pulses.  Exam reveals no gallop.   Pulmonary/Chest: No respiratory distress. She has no rales.  Abdominal: She exhibits no distension and no mass. There is tenderness. There is no guarding.  Musculoskeletal: She exhibits no edema and  no tenderness.  Lymphadenopathy:    She has no cervical adenopathy.  Skin: No rash noted. No erythema.  Psychiatric: Memory, affect and judgment normal.    Lab Results  Component Value Date   TSH 3.751 06/20/2012   Lab Results  Component Value Date   WBC 5.1 02/18/2012   HGB 11.4* 02/18/2012   HCT 34.3* 02/18/2012   MCV 92.9 02/18/2012   PLT 181.0 02/18/2012   Lab Results  Component Value Date   CREATININE 1.28* 06/20/2012   BUN 18 06/20/2012   NA 140 06/20/2012   K 4.2 06/20/2012  CL 103 06/20/2012   CO2 28 06/20/2012   Lab Results  Component Value Date   ALT 12 06/20/2012   AST 17 06/20/2012   ALKPHOS 14* 06/20/2012   BILITOT 0.5 06/20/2012   Lab Results  Component Value Date   CHOL 156 06/20/2012   Lab Results  Component Value Date   HDL 36* 06/20/2012   Lab Results  Component Value Date   LDLCALC 81 06/20/2012   Lab Results  Component Value Date   TRIG 197* 06/20/2012   Lab Results  Component Value Date   CHOLHDL 4.3 06/20/2012     Assessment & Plan  HYPERTENSION Well controlled today no changes   PAROXYSMAL ATRIAL FIBRILLATION RRR today  Acute bronchitis resolved  Headache(784.0) Injections have been helping Headaches temporarily. Has appt next month. Will call to be seen sooner if pain worsens. It is largely right sided.  Long term (current) use of anticoagulants Stable dosing of coumadin. Doing well  ESOPHAGEAL STRICTURE Increased dysphagia over past month, is encouraged to follow up with gastroenterology and to avoid spicy and fatty foods, continue Nexium

## 2012-06-22 NOTE — Assessment & Plan Note (Signed)
Increased dysphagia over past month, is encouraged to follow up with gastroenterology and to avoid spicy and fatty foods, continue Nexium

## 2012-06-22 NOTE — Assessment & Plan Note (Signed)
Injections have been helping Headaches temporarily. Has appt next month. Will call to be seen sooner if pain worsens. It is largely right sided.

## 2012-06-22 NOTE — Assessment & Plan Note (Signed)
resolved 

## 2012-06-22 NOTE — Assessment & Plan Note (Signed)
Well controlled today no changes 

## 2012-06-26 ENCOUNTER — Ambulatory Visit (INDEPENDENT_AMBULATORY_CARE_PROVIDER_SITE_OTHER): Payer: Medicare Other | Admitting: Family Medicine

## 2012-06-26 ENCOUNTER — Encounter: Payer: Self-pay | Admitting: Family Medicine

## 2012-06-26 VITALS — BP 112/60 | HR 69 | Temp 98.4°F | Ht 60.0 in | Wt 122.1 lb

## 2012-06-26 DIAGNOSIS — I10 Essential (primary) hypertension: Secondary | ICD-10-CM

## 2012-06-26 DIAGNOSIS — N289 Disorder of kidney and ureter, unspecified: Secondary | ICD-10-CM

## 2012-06-26 DIAGNOSIS — R7989 Other specified abnormal findings of blood chemistry: Secondary | ICD-10-CM

## 2012-06-26 DIAGNOSIS — E875 Hyperkalemia: Secondary | ICD-10-CM

## 2012-06-26 LAB — RENAL FUNCTION PANEL
BUN: 24 mg/dL — ABNORMAL HIGH (ref 6–23)
CO2: 25 mEq/L (ref 19–32)
Calcium: 9.5 mg/dL (ref 8.4–10.5)
Glucose, Bld: 106 mg/dL — ABNORMAL HIGH (ref 70–99)
Potassium: 4.1 mEq/L (ref 3.5–5.3)

## 2012-06-26 MED ORDER — CALCIUM ACETATE (PHOS BINDER) 667 MG PO TABS: 667.0000 mg | ORAL_TABLET | Freq: Two times a day (BID) | ORAL | Status: DC

## 2012-06-26 NOTE — Patient Instructions (Addendum)
DASH Diet  The DASH diet stands for "Dietary Approaches to Stop Hypertension." It is a healthy eating plan that has been shown to reduce high blood pressure (hypertension) in as little as 14 days, while also possibly providing other significant health benefits. These other health benefits include reducing the risk of breast cancer after menopause and reducing the risk of type 2 diabetes, heart disease, colon cancer, and stroke. Health benefits also include weight loss and slowing kidney failure in patients with chronic kidney disease.   DIET GUIDELINES  · Limit salt (sodium). Your diet should contain less than 1500 mg of sodium daily.  · Limit refined or processed carbohydrates. Your diet should include mostly whole grains. Desserts and added sugars should be used sparingly.  · Include small amounts of heart-healthy fats. These types of fats include nuts, oils, and tub margarine. Limit saturated and trans fats. These fats have been shown to be harmful in the body.  CHOOSING FOODS   The following food groups are based on a 2000 calorie diet. See your Registered Dietitian for individual calorie needs.  Grains and Grain Products (6 to 8 servings daily)  · Eat More Often: Whole-wheat bread, brown rice, whole-grain or wheat pasta, quinoa, popcorn without added fat or salt (air popped).  · Eat Less Often: White bread, white pasta, white rice, cornbread.  Vegetables (4 to 5 servings daily)  · Eat More Often: Fresh, frozen, and canned vegetables. Vegetables may be raw, steamed, roasted, or grilled with a minimal amount of fat.  · Eat Less Often/Avoid: Creamed or fried vegetables. Vegetables in a cheese sauce.  Fruit (4 to 5 servings daily)  · Eat More Often: All fresh, canned (in natural juice), or frozen fruits. Dried fruits without added sugar. One hundred percent fruit juice (½ cup [237 mL] daily).  · Eat Less Often: Dried fruits with added sugar. Canned fruit in light or heavy syrup.  Lean Meats, Fish, and Poultry (2  servings or less daily. One serving is 3 to 4 oz [85-114 g]).  · Eat More Often: Ninety percent or leaner ground beef, tenderloin, sirloin. Round cuts of beef, chicken breast, turkey breast. All fish. Grill, bake, or broil your meat. Nothing should be fried.  · Eat Less Often/Avoid: Fatty cuts of meat, turkey, or chicken leg, thigh, or wing. Fried cuts of meat or fish.  Dairy (2 to 3 servings)  · Eat More Often: Low-fat or fat-free milk, low-fat plain or light yogurt, reduced-fat or part-skim cheese.  · Eat Less Often/Avoid: Milk (whole, 2%). Whole milk yogurt. Full-fat cheeses.  Nuts, Seeds, and Legumes (4 to 5 servings per week)  · Eat More Often: All without added salt.  · Eat Less Often/Avoid: Salted nuts and seeds, canned beans with added salt.  Fats and Sweets (limited)  · Eat More Often: Vegetable oils, tub margarines without trans fats, sugar-free gelatin. Mayonnaise and salad dressings.  · Eat Less Often/Avoid: Coconut oils, palm oils, butter, stick margarine, cream, half and half, cookies, candy, pie.  FOR MORE INFORMATION  The Dash Diet Eating Plan: www.dashdiet.org  Document Released: 12/07/2010 Document Revised: 03/12/2011 Document Reviewed: 12/07/2010  [employer]® Patient Information ©2014 [employer].

## 2012-06-27 ENCOUNTER — Ambulatory Visit (INDEPENDENT_AMBULATORY_CARE_PROVIDER_SITE_OTHER): Payer: Medicare Other

## 2012-06-27 DIAGNOSIS — Z7901 Long term (current) use of anticoagulants: Secondary | ICD-10-CM

## 2012-06-27 DIAGNOSIS — I4891 Unspecified atrial fibrillation: Secondary | ICD-10-CM

## 2012-06-28 ENCOUNTER — Encounter: Payer: Self-pay | Admitting: Family Medicine

## 2012-06-28 DIAGNOSIS — N1831 Chronic kidney disease, stage 3a: Secondary | ICD-10-CM | POA: Insufficient documentation

## 2012-06-28 DIAGNOSIS — N289 Disorder of kidney and ureter, unspecified: Secondary | ICD-10-CM | POA: Insufficient documentation

## 2012-06-28 DIAGNOSIS — E875 Hyperkalemia: Secondary | ICD-10-CM | POA: Insufficient documentation

## 2012-06-28 NOTE — Assessment & Plan Note (Signed)
Well controlled, no changes 

## 2012-06-28 NOTE — Assessment & Plan Note (Signed)
Avoid potassium  And recheck in 1 week

## 2012-06-28 NOTE — Progress Notes (Signed)
Patient ID: [name] [name], female   DOB: July 23, 1924, 79 y.o.   MRN: [id number] [name] [id number] November 17, 1924 06/28/2012      Progress Note-Follow Up  Subjective  Chief Complaint  Chief Complaint  Patient presents with  . Follow-up    4 day    HPI  Patient is an 79 year old Caucasian female who is in today in followup. She feels well. She's recently come back from a long trip to [place] driving. Other than fatigue she offers no complaints. No chest pain, palpitations, shortness of breath GI or GU concerns noted. Is taking medications as prescribed.  Past Medical History  Diagnosis Date  . Paroxysmal a-fib   . Mitral and aortic regurgitation   . Mitral valve prolapse     hx of  . Hypertension   . Hyperlipidemia   . Vitamin D deficiency   . Pneumonia   . Tracheitis   . Gait difficulty   . Anemia   . Melena   . IBS (irritable bowel syndrome)   . Incontinence   . GERD (gastroesophageal reflux disease)   . Osteopenia   . Thyroid disease     hypo  . Personal history of colonic polyps   . Current use of long term anticoagulation   . Cough   . Diarrhea   . Pneumonia     organism unspecified  . Constipation   . UTI (lower urinary tract infection)   . Unspecified adverse effect of other drug, medicinal and biological substance(995.29)   . History of mammogram 2009  . Diverticulosis   . GERD (gastroesophageal reflux disease)   . Hyperlipidemia   . Atrial fibrillation   . Arthritis     hips, knees  . Headache(784.0) 06/22/2012  . Renal insufficiency 06/28/2012  . Hyperkalemia 06/28/2012    Past Surgical History  Procedure Laterality Date  . Breast biopsy      left  . Cardiac electrophysiology mapping and ablation    . Abdominal hysterectomy      partial, ovaries left in place  . Cataract extraction, bilateral    . Esophagoscopy w/ botox injection      Family History  Problem Relation Age of Onset  . Diabetes Mother   . Stroke Mother   . COPD Father    . Heart disease Maternal Aunt   . Heart disease Maternal Uncle   . Allergies Daughter   . COPD Daughter   . Stroke Daughter   . COPD Daughter     previous smoker  . Stroke Maternal Grandfather     History   Social History  . Marital Status: Widowed    Spouse Name: N/A    Number of Children: N/A  . Years of Education: N/A   Occupational History  . Not on file.   Social History Main Topics  . Smoking status: Never Smoker   . Smokeless tobacco: Never Used  . Alcohol Use: No  . Drug Use: No  . Sexually Active: No   Other Topics Concern  . Not on file   Social History Narrative  . No narrative on file    Current Outpatient Prescriptions on File Prior to Visit  Medication Sig Dispense Refill  . ALPRAZolam (XANAX) 0.25 MG tablet Take 1 tablet (0.25 mg total) by mouth 3 (three) times daily. For stress  90 tablet  5  . atorvastatin (LIPITOR) 10 MG tablet Take 1 tablet (10 mg total) by mouth daily.  30 tablet  5  . Cholecalciferol (  VITAMIN D) 2000 UNITS CAPS Take 1 capsule (2,000 Units total) by mouth daily.  30 capsule    . esomeprazole (NEXIUM) 40 MG capsule 1 cap AM and PM for reflux and cough  180 capsule  3  . estradiol (ESTRACE) 0.5 MG tablet Take 0.5 tablets (0.25 mg total) by mouth daily.  90 tablet  3  . metoprolol tartrate (LOPRESSOR) 25 MG tablet Take 0.5 tablets (12.5 mg total) by mouth 2 (two) times daily. bid  90 tablet  3  . ramipril (ALTACE) 10 MG capsule Take 1 capsule (10 mg total) by mouth daily.  90 capsule  3  . vitamin B-12 (CYANOCOBALAMIN) 1000 MCG tablet Take 1 tablet (1,000 mcg total) by mouth daily.      Marland Kitchen warfarin (COUMADIN) 2.5 MG tablet Take 1 tablet (2.5 mg total) by mouth daily.  30 tablet  1   No current facility-administered medications on file prior to visit.    Allergies  Allergen Reactions  . Darifenacin Hydrobromide     REACTION: causes her dizziness  . Sulfonamide Derivatives   . Tramadol Other (See Comments)    Insomnia, anorexia     Review of Systems  Review of Systems  Constitutional: Negative for fever and malaise/fatigue.  HENT: Negative for congestion.   Eyes: Negative for pain and discharge.  Respiratory: Negative for shortness of breath.   Cardiovascular: Negative for chest pain, palpitations and leg swelling.  Gastrointestinal: Negative for nausea, abdominal pain and diarrhea.  Genitourinary: Negative for dysuria.  Musculoskeletal: Negative for falls.  Skin: Negative for rash.  Neurological: Negative for loss of consciousness and headaches.  Endo/Heme/Allergies: Negative for polydipsia.  Psychiatric/Behavioral: Negative for depression and suicidal ideas. The patient is not nervous/anxious and does not have insomnia.     Objective  BP 112/60  Pulse 69  Temp(Src) 98.4 F (36.9 C) (Oral)  Ht 5' (1.524 m)  Wt 122 lb 1.3 oz (55.375 kg)  BMI 23.84 kg/m2  SpO2 97%  Physical Exam  Physical Exam  Constitutional: She is oriented to person, place, and time and well-developed, well-nourished, and in no distress. No distress.  HENT:  Head: Normocephalic and atraumatic.  Eyes: Conjunctivae are normal.  Neck: Neck supple. No thyromegaly present.  Cardiovascular: Normal rate, regular rhythm and normal heart sounds.  Exam reveals no gallop.   No murmur heard. Pulmonary/Chest: Effort normal and breath sounds normal. She has no wheezes.  Abdominal: She exhibits no distension and no mass.  Musculoskeletal: She exhibits no edema.  Lymphadenopathy:    She has no cervical adenopathy.  Neurological: She is alert and oriented to person, place, and time.  Skin: Skin is warm and dry. No rash noted. She is not diaphoretic.  Psychiatric: Memory, affect and judgment normal.    Lab Results  Component Value Date   TSH 3.751 06/20/2012   Lab Results  Component Value Date   WBC 5.1 02/18/2012   HGB 11.4* 02/18/2012   HCT 34.3* 02/18/2012   MCV 92.9 02/18/2012   PLT 181.0 02/18/2012   Lab Results  Component  Value Date   CREATININE 1.37* 06/26/2012   BUN 24* 06/26/2012   NA 142 06/26/2012   K 4.1 06/26/2012   CL 106 06/26/2012   CO2 25 06/26/2012   Lab Results  Component Value Date   ALT 12 06/20/2012   AST 17 06/20/2012   ALKPHOS 14* 06/20/2012   BILITOT 0.5 06/20/2012   Lab Results  Component Value Date   CHOL 156 06/20/2012  Lab Results  Component Value Date   HDL 36* 06/20/2012   Lab Results  Component Value Date   LDLCALC 81 06/20/2012   Lab Results  Component Value Date   TRIG 197* 06/20/2012   Lab Results  Component Value Date   CHOLHDL 4.3 06/20/2012     Assessment & Plan  HYPERTENSION Well controlled, no changes.  Hyperkalemia Avoid potassium  And recheck in 1 week

## 2012-06-30 ENCOUNTER — Telehealth: Payer: Self-pay | Admitting: *Deleted

## 2012-06-30 DIAGNOSIS — R7989 Other specified abnormal findings of blood chemistry: Secondary | ICD-10-CM

## 2012-06-30 NOTE — Telephone Encounter (Signed)
Message copied by Regis Bill on Mon Jun 30, 2012 11:33 AM ------      Message from: Danise Edge A      Created: Thu Jun 26, 2012 10:27 PM       Notify phosphorus up and creatinine up. She should stop hctz and start Phoslo bid (already sent to pharmacy). In 2-3 weeks she should come in for nurse visit bp check and to her her phosphorus checked again in a renal panel ------

## 2012-06-30 NOTE — Telephone Encounter (Signed)
Patient informed, understood & agreed; order for Renal lab & appt for BP check scheduled for Fri, 06.18.14/SLS

## 2012-07-02 ENCOUNTER — Telehealth: Payer: Self-pay

## 2012-07-02 DIAGNOSIS — E875 Hyperkalemia: Secondary | ICD-10-CM

## 2012-07-02 NOTE — Telephone Encounter (Signed)
Message copied by Court Joy on Wed Jul 02, 2012  9:58 AM ------      Message from: Danise Edge A      Created: Sat Jun 28, 2012 10:14 PM       Please have her come in this week for repeat renal panel. Her K was up again. Have her avoid K containing foods til labs drawn ------

## 2012-07-02 NOTE — Telephone Encounter (Signed)
I left a message for patient to return my call.   Labs ordered

## 2012-07-18 ENCOUNTER — Ambulatory Visit: Payer: Medicare Other

## 2012-07-18 VITALS — BP 128/58 | HR 58

## 2012-07-18 DIAGNOSIS — I10 Essential (primary) hypertension: Secondary | ICD-10-CM

## 2012-07-18 LAB — RENAL FUNCTION PANEL
BUN: 21 mg/dL (ref 6–23)
CO2: 29 mEq/L (ref 19–32)
Calcium: 10.2 mg/dL (ref 8.4–10.5)
Chloride: 103 mEq/L (ref 96–112)
Glucose, Bld: 101 mg/dL — ABNORMAL HIGH (ref 70–99)
Potassium: 4.4 mEq/L (ref 3.5–5.3)

## 2012-07-18 NOTE — Progress Notes (Signed)
  Subjective:    Patient ID: [name] [name], female    DOB: 17-Jun-1924, 79 y.o.   MRN: [id number]  HPI    Review of Systems     Objective:   Physical Exam        Assessment & Plan:  Pt came in today for a bp check

## 2012-07-21 NOTE — Telephone Encounter (Signed)
Quick Note:  Patient Informed and voiced understanding.  Labs order placed ______

## 2012-07-21 NOTE — Addendum Note (Signed)
Addended by: Court Joy on: 07/21/2012 08:59 AM   Modules accepted: Orders

## 2012-07-25 ENCOUNTER — Telehealth: Payer: Self-pay | Admitting: *Deleted

## 2012-07-25 ENCOUNTER — Ambulatory Visit (INDEPENDENT_AMBULATORY_CARE_PROVIDER_SITE_OTHER): Payer: Medicare Other | Admitting: *Deleted

## 2012-07-25 DIAGNOSIS — Z7901 Long term (current) use of anticoagulants: Secondary | ICD-10-CM

## 2012-07-25 DIAGNOSIS — I4891 Unspecified atrial fibrillation: Secondary | ICD-10-CM

## 2012-07-25 NOTE — Telephone Encounter (Signed)
Mutual patient on coumadin pending esophageal stretching 08/01/2012 (according to pt), called to see if she needs to hold coumadin, awaiting a response

## 2012-07-28 ENCOUNTER — Telehealth: Payer: Self-pay | Admitting: *Deleted

## 2012-07-28 NOTE — Telephone Encounter (Signed)
Have received fax back that patient has appt with Dr Arlyce Dice on 08/01/2012 no date for procedure yet.

## 2012-07-28 NOTE — Telephone Encounter (Signed)
Message copied by Carmela Hurt on Mon Jul 28, 2012  1:20 PM ------      Message from: Melvia Heaps D      Created: Mon Jul 28, 2012 10:04 AM      Regarding: RE: mutual patient       I have not seen this patient since 2011 and do not have her scheduled for EGD.  Perhaps she was seen by another GI MD outside of [facility].      ----- Message -----         From: Carmela Hurt, RN         Sent: 07/25/2012  10:52 AM           To: Louis Meckel, MD      Subject: mutual patient                                           Patient states she is having her esophagus stretched 08/01/2012, does she need to hold her COUMADIN for this procedure.             Thanks, coumadin clinic [facility]       ------

## 2012-07-28 NOTE — Telephone Encounter (Signed)
Spoke with patient regarding Dr Nita Sells note, she insists that he is doing her procedure, phone cut off and unable to get in touch with her.

## 2012-08-01 ENCOUNTER — Telehealth: Payer: Self-pay | Admitting: Gastroenterology

## 2012-08-01 ENCOUNTER — Ambulatory Visit (INDEPENDENT_AMBULATORY_CARE_PROVIDER_SITE_OTHER): Payer: Medicare Other | Admitting: Gastroenterology

## 2012-08-01 ENCOUNTER — Telehealth: Payer: Self-pay | Admitting: *Deleted

## 2012-08-01 ENCOUNTER — Encounter: Payer: Self-pay | Admitting: Gastroenterology

## 2012-08-01 VITALS — BP 124/54 | HR 64 | Ht 60.0 in | Wt 122.2 lb

## 2012-08-01 DIAGNOSIS — Z8679 Personal history of other diseases of the circulatory system: Secondary | ICD-10-CM

## 2012-08-01 DIAGNOSIS — K220 Achalasia of cardia: Secondary | ICD-10-CM

## 2012-08-01 DIAGNOSIS — K219 Gastro-esophageal reflux disease without esophagitis: Secondary | ICD-10-CM

## 2012-08-01 MED ORDER — OMEPRAZOLE-SODIUM BICARBONATE 40-1100 MG PO CAPS: ORAL_CAPSULE | ORAL | Status: DC

## 2012-08-01 NOTE — Telephone Encounter (Signed)
Dr Arlyce Dice, Insurance will not Cover Zegerid   And pt said it is to expensive   What do you want me to send

## 2012-08-01 NOTE — Progress Notes (Signed)
History of Present Illness:  Pleasant 79 year old white female with history of achalasia here for evaluation of dysphagia.  She last underwent Botox injection of the LES in 2011. She had an excellent response. Over the past few months she's developed dysphagia to solids and liquids. She complains of pyrosis, especially at night. She's on Nexium every morning. She takes Coumadin because of atrial fibrillation.    Past Medical History  Diagnosis Date  . Paroxysmal a-fib   . Mitral and aortic regurgitation   . Mitral valve prolapse     hx of  . Hypertension   . Hyperlipidemia   . Vitamin D deficiency   . Pneumonia   . Tracheitis   . Gait difficulty   . Anemia   . Melena   . IBS (irritable bowel syndrome)   . Incontinence   . GERD (gastroesophageal reflux disease)   . Osteopenia   . Thyroid disease     hypo  . Personal history of colonic polyps   . Current use of long term anticoagulation   . Cough   . Diarrhea   . Pneumonia     organism unspecified  . Constipation   . UTI (lower urinary tract infection)   . Unspecified adverse effect of other drug, medicinal and biological substance(995.29)   . History of mammogram 2009  . Diverticulosis   . GERD (gastroesophageal reflux disease)   . Hyperlipidemia   . Atrial fibrillation   . Arthritis     hips, knees  . Headache(784.0) 06/22/2012  . Renal insufficiency 06/28/2012  . Hyperkalemia 06/28/2012   Past Surgical History  Procedure Laterality Date  . Breast biopsy Left   . Cardiac electrophysiology mapping and ablation    . Abdominal hysterectomy      partial, ovaries left in place  . Cataract extraction, bilateral    . Esophagoscopy w/ botox injection     family history includes Allergies in her daughter; COPD in her daughters and father; Diabetes in her mother; Heart disease in her maternal aunt and maternal uncle; and Stroke in her daughter, maternal grandfather, and mother. Current Outpatient Prescriptions  Medication  Sig Dispense Refill  . ALPRAZolam (XANAX) 0.25 MG tablet Take 1 tablet (0.25 mg total) by mouth 3 (three) times daily. For stress  90 tablet  5  . atorvastatin (LIPITOR) 10 MG tablet Take 1 tablet (10 mg total) by mouth daily.  30 tablet  5  . calcium acetate (PHOSLO) 667 MG tablet Take 1 tablet by mouth 2 (two) times daily.  60 tablet  2  . Cholecalciferol (VITAMIN D) 2000 UNITS CAPS Take 1 capsule (2,000 Units total) by mouth daily.  30 capsule    . esomeprazole (NEXIUM) 40 MG capsule 1 cap AM and PM for reflux and cough  180 capsule  3  . metoprolol tartrate (LOPRESSOR) 25 MG tablet Take 0.5 tablets (12.5 mg total) by mouth 2 (two) times daily. bid  90 tablet  3  . ramipril (ALTACE) 10 MG capsule Take 1 capsule (10 mg total) by mouth daily.  90 capsule  3  . vitamin B-12 (CYANOCOBALAMIN) 1000 MCG tablet Take 1 tablet (1,000 mcg total) by mouth daily.      Marland Kitchen warfarin (COUMADIN) 2.5 MG tablet Take 1 tablet (2.5 mg total) by mouth daily.  30 tablet  1   No current facility-administered medications for this visit.   Allergies as of 08/01/2012 - Review Complete 08/01/2012  Allergen Reaction Noted  . Darifenacin hydrobromide    .  Sulfonamide derivatives  01/29/2007  . Tramadol Other (See Comments) 11/13/2010    reports that she has never smoked. She has never used smokeless tobacco. She reports that she does not drink alcohol or use illicit drugs.     Review of Systems: Pertinent positive and negative review of systems were noted in the above HPI section. All other review of systems were otherwise negative.  Vital signs were reviewed in today's medical record Physical Exam: General: Well developed , well nourished, no acute distress Skin: anicteric Head: Normocephalic and atraumatic Eyes:  sclerae anicteric, EOMI Ears: Normal auditory acuity Mouth: No deformity or lesions Neck: Supple, no masses or thyromegaly Lungs: Clear throughout to auscultation Heart: Regular rate and rhythm; no  murmurs, rubs or bruits Abdomen: Soft, non tender and non distended. No masses, hepatosplenomegaly or hernias noted. Normal Bowel sounds Rectal:deferred Musculoskeletal: Symmetrical with no gross deformities. There is a moderate kyphosis  Skin: No lesions on visible extremities Pulses:  Normal pulses noted Extremities: No clubbing, cyanosis, edema or deformities noted Neurological: Alert oriented x 4, grossly nonfocal Cervical Nodes:  No significant cervical adenopathy Inguinal Nodes: No significant inguinal adenopathy Psychological:  Alert and cooperative. Normal mood and affect

## 2012-08-01 NOTE — Assessment & Plan Note (Signed)
Patient is symptomatic again from achalasia.  Recommendations #1 repeat Botox injection of the LES.  I'll check with the patient's cardiologist whether Coumadin can be held

## 2012-08-01 NOTE — Patient Instructions (Addendum)
You Endoscopy has been scheduled on 08/27/2012 at [facility] You will be contaced by our office prior to your procedure for directions on holding your Coumadin/Warfarin.  If you do not hear from our office 1 week prior to your scheduled procedure, please call [phone] to discuss.

## 2012-08-01 NOTE — Telephone Encounter (Signed)
She can't take Nexium before bedtime instead

## 2012-08-01 NOTE — Telephone Encounter (Signed)
Will send to Dr. Daleen Squibb for clearance.

## 2012-08-01 NOTE — Telephone Encounter (Signed)
[facility] [address] [place] [address]   08/01/2012    RE: [name] [name] DOB: 01/29/1924 MRN: [id number]   Dear  Raul Del RN    We have scheduled the above patient for an endoscopic procedure. Our records show that she is on anticoagulation therapy.   Please advise as to how long the patient may come off her therapy of Coumadin prior to the procedure, which is scheduled for 08/27/2012.  Please fax back/ or route the completed form to Solina Heron at [phone].   Sincerely,  Merri Ray CMA AAMA

## 2012-08-01 NOTE — Telephone Encounter (Signed)
She can take Nexium at bedtime instead

## 2012-08-01 NOTE — Assessment & Plan Note (Signed)
She's having breakthrough nocturnal GERD.  Recommendations #1 substitute Zegerid each bedtime for Nexium

## 2012-08-04 ENCOUNTER — Encounter: Payer: Self-pay | Admitting: Gastroenterology

## 2012-08-07 NOTE — Telephone Encounter (Signed)
Please advise on coumadin clearance

## 2012-08-07 NOTE — Telephone Encounter (Signed)
Dr. Daleen Squibb will be in the [place] office 08/15/12 I will forward this to him

## 2012-08-11 NOTE — Telephone Encounter (Signed)
She can come off without overlap.

## 2012-08-12 ENCOUNTER — Telehealth: Payer: Self-pay | Admitting: *Deleted

## 2012-08-12 NOTE — Telephone Encounter (Signed)
Patient has been moved from 8/27 to 09/03/2012 [facility] rescheduled pateint aware of new time for coumadin

## 2012-08-12 NOTE — Telephone Encounter (Signed)
Discussed with patient to come off coumadin 5 days prior

## 2012-08-12 NOTE — Telephone Encounter (Signed)
Pateint stated she is already taking a nexium at bedtime

## 2012-08-14 ENCOUNTER — Other Ambulatory Visit: Payer: Self-pay | Admitting: Family Medicine

## 2012-08-15 ENCOUNTER — Ambulatory Visit (INDEPENDENT_AMBULATORY_CARE_PROVIDER_SITE_OTHER): Payer: Medicare Other | Admitting: *Deleted

## 2012-08-15 DIAGNOSIS — I4891 Unspecified atrial fibrillation: Secondary | ICD-10-CM

## 2012-08-15 DIAGNOSIS — Z7901 Long term (current) use of anticoagulants: Secondary | ICD-10-CM

## 2012-08-15 MED ORDER — WARFARIN SODIUM 2.5 MG PO TABS: 2.5000 mg | ORAL_TABLET | ORAL | Status: DC

## 2012-08-15 NOTE — Patient Instructions (Addendum)
Procedure is 09/03/2012 Instructed to hold coumadin 5 days prior to procedure, take last dose coumadin  Last dose coumadin 08/28/2012  When you start back on coumadin take an extra 1/2 tablet for 2 days, then resume prior dose

## 2012-08-19 LAB — RENAL FUNCTION PANEL
BUN: 15 mg/dL (ref 6–23)
CO2: 29 mEq/L (ref 19–32)
Chloride: 104 mEq/L (ref 96–112)
Creat: 1.09 mg/dL (ref 0.50–1.10)
Glucose, Bld: 93 mg/dL (ref 70–99)
Potassium: 4.2 mEq/L (ref 3.5–5.3)

## 2012-08-20 NOTE — Telephone Encounter (Signed)
Quick Note:  Patient Informed and voiced understanding ______ 

## 2012-09-02 ENCOUNTER — Telehealth: Payer: Self-pay | Admitting: Gastroenterology

## 2012-09-02 NOTE — Telephone Encounter (Signed)
Pt called and wanted to know what time she is supposed to be at the hospital tomorrow. Reviewed with pt that she is supposed to be at [facility] at 11:30am for a 12:30pm appt. Pt verbalized understanding.

## 2012-09-03 ENCOUNTER — Encounter (HOSPITAL_COMMUNITY): Payer: Self-pay | Admitting: *Deleted

## 2012-09-03 ENCOUNTER — Ambulatory Visit (HOSPITAL_COMMUNITY)
Admission: RE | Admit: 2012-09-03 | Discharge: 2012-09-03 | Disposition: A | Discharge status: Home / Self Care | Payer: Medicare Other | Source: Ambulatory Visit | Attending: Gastroenterology | Admitting: Gastroenterology

## 2012-09-03 ENCOUNTER — Encounter (HOSPITAL_COMMUNITY)
Admission: RE | Disposition: A | Discharge status: Home / Self Care | Payer: Self-pay | Source: Ambulatory Visit | Attending: Gastroenterology

## 2012-09-03 DIAGNOSIS — I4891 Unspecified atrial fibrillation: Secondary | ICD-10-CM | POA: Insufficient documentation

## 2012-09-03 DIAGNOSIS — K220 Achalasia of cardia: Secondary | ICD-10-CM | POA: Insufficient documentation

## 2012-09-03 DIAGNOSIS — R12 Heartburn: Secondary | ICD-10-CM | POA: Insufficient documentation

## 2012-09-03 DIAGNOSIS — Q398 Other congenital malformations of esophagus: Secondary | ICD-10-CM | POA: Insufficient documentation

## 2012-09-03 DIAGNOSIS — I080 Rheumatic disorders of both mitral and aortic valves: Secondary | ICD-10-CM | POA: Insufficient documentation

## 2012-09-03 DIAGNOSIS — D131 Benign neoplasm of stomach: Secondary | ICD-10-CM | POA: Insufficient documentation

## 2012-09-03 DIAGNOSIS — Z8679 Personal history of other diseases of the circulatory system: Secondary | ICD-10-CM

## 2012-09-03 DIAGNOSIS — E039 Hypothyroidism, unspecified: Secondary | ICD-10-CM | POA: Insufficient documentation

## 2012-09-03 DIAGNOSIS — Z860100 Personal history of colon polyps, unspecified: Secondary | ICD-10-CM | POA: Insufficient documentation

## 2012-09-03 DIAGNOSIS — I10 Essential (primary) hypertension: Secondary | ICD-10-CM | POA: Insufficient documentation

## 2012-09-03 DIAGNOSIS — K219 Gastro-esophageal reflux disease without esophagitis: Secondary | ICD-10-CM | POA: Insufficient documentation

## 2012-09-03 DIAGNOSIS — Z7901 Long term (current) use of anticoagulants: Secondary | ICD-10-CM | POA: Insufficient documentation

## 2012-09-03 HISTORY — PX: BOTOX INJECTION: SHX5754

## 2012-09-03 HISTORY — PX: ESOPHAGOGASTRODUODENOSCOPY: SHX5428

## 2012-09-03 SURGERY — EGD (ESOPHAGOGASTRODUODENOSCOPY)
Anesthesia: Moderate Sedation

## 2012-09-03 MED ORDER — SODIUM CHLORIDE 0.9 % IV SOLN
INTRAVENOUS | Status: DC
  Administered 2012-09-03: 500 mL via INTRAVENOUS

## 2012-09-03 MED ORDER — FENTANYL CITRATE 0.05 MG/ML IJ SOLN
INTRAMUSCULAR | Status: AC
  Filled 2012-09-03: qty 2

## 2012-09-03 MED ORDER — MIDAZOLAM HCL 10 MG/2ML IJ SOLN
INTRAMUSCULAR | Status: DC | PRN
  Administered 2012-09-03: 2 mg via INTRAVENOUS
  Administered 2012-09-03: 1 mg via INTRAVENOUS
  Administered 2012-09-03: 2 mg via INTRAVENOUS
  Administered 2012-09-03: 1 mg via INTRAVENOUS

## 2012-09-03 MED ORDER — MIDAZOLAM HCL 10 MG/2ML IJ SOLN
INTRAMUSCULAR | Status: AC
  Filled 2012-09-03: qty 2

## 2012-09-03 MED ORDER — ONABOTULINUMTOXINA 100 UNITS IJ SOLR
100.0000 [IU] | Freq: Once | INTRAMUSCULAR | Status: AC
  Administered 2012-09-03: 100 [IU] via INTRAMUSCULAR
  Filled 2012-09-03: qty 100

## 2012-09-03 MED ORDER — FENTANYL CITRATE 0.05 MG/ML IJ SOLN
INTRAMUSCULAR | Status: DC | PRN
  Administered 2012-09-03 (×2): 25 ug via INTRAVENOUS

## 2012-09-03 MED ORDER — SODIUM CHLORIDE 0.9 % IJ SOLN
INTRAMUSCULAR | Status: AC
  Filled 2012-09-03: qty 10

## 2012-09-03 MED ORDER — BUTAMBEN-TETRACAINE-BENZOCAINE 2-2-14 % EX AERO
INHALATION_SPRAY | CUTANEOUS | Status: DC | PRN
  Administered 2012-09-03: 2 via TOPICAL

## 2012-09-03 NOTE — H&P (Signed)
History of Present Illness:  Pleasant 79 year old white female with history of achalasia here for evaluation of dysphagia.  She last underwent Botox injection of the LES in 2011. She had an excellent response. Over the past few months she's developed dysphagia to solids and liquids. She complains of pyrosis, especially at night. She's on Nexium every morning. She takes Coumadin because of atrial fibrillation.        Past Medical History   Diagnosis  Date   .  Paroxysmal a-fib     .  Mitral and aortic regurgitation     .  Mitral valve prolapse         hx of   .  Hypertension     .  Hyperlipidemia     .  Vitamin D deficiency     .  Pneumonia     .  Tracheitis     .  Gait difficulty     .  Anemia     .  Melena     .  IBS (irritable bowel syndrome)     .  Incontinence     .  GERD (gastroesophageal reflux disease)     .  Osteopenia     .  Thyroid disease         hypo   .  Personal history of colonic polyps     .  Current use of long term anticoagulation     .  Cough     .  Diarrhea     .  Pneumonia         organism unspecified   .  Constipation     .  UTI (lower urinary tract infection)     .  Unspecified adverse effect of other drug, medicinal and biological substance(995.29)     .  History of mammogram  2009   .  Diverticulosis     .  GERD (gastroesophageal reflux disease)     .  Hyperlipidemia     .  Atrial fibrillation     .  Arthritis         hips, knees   .  Headache(784.0)  06/22/2012   .  Renal insufficiency  06/28/2012   .  Hyperkalemia  06/28/2012       Past Surgical History   Procedure  Laterality  Date   .  Breast biopsy  Left     .  Cardiac electrophysiology mapping and ablation       .  Abdominal hysterectomy           partial, ovaries left in place   .  Cataract extraction, bilateral       .  Esophagoscopy w/ botox injection          family history includes Allergies in her daughter; COPD in her daughters and father; Diabetes in  her mother; Heart disease in her maternal aunt and maternal uncle; and Stroke in her daughter, maternal grandfather, and mother. Current Outpatient Prescriptions   Medication  Sig  Dispense  Refill   .  ALPRAZolam (XANAX) 0.25 MG tablet  Take 1 tablet (0.25 mg total) by mouth 3 (three) times daily. For stress   90 tablet   5   .  atorvastatin (LIPITOR) 10 MG tablet  Take 1 tablet (10 mg total) by mouth daily.   30 tablet   5   .  calcium acetate (PHOSLO) 667 MG tablet  Take 1 tablet by mouth 2 (two) times daily.   60 tablet   2   .  Cholecalciferol (VITAMIN D) 2000 UNITS CAPS  Take 1 capsule (2,000 Units total) by mouth daily.   30 capsule      .  esomeprazole (NEXIUM) 40 MG capsule  1 cap AM and PM for reflux and cough   180 capsule   3   .  metoprolol tartrate (LOPRESSOR) 25 MG tablet  Take 0.5 tablets (12.5 mg total) by mouth 2 (two) times daily. bid   90 tablet   3   .  ramipril (ALTACE) 10 MG capsule  Take 1 capsule (10 mg total) by mouth daily.   90 capsule   3   .  vitamin B-12 (CYANOCOBALAMIN) 1000 MCG tablet  Take 1 tablet (1,000 mcg total) by mouth daily.         Marland Kitchen  warfarin (COUMADIN) 2.5 MG tablet  Take 1 tablet (2.5 mg total) by mouth daily.   30 tablet   1       No current facility-administered medications for this visit.       Allergies as of 08/01/2012 - Review Complete 08/01/2012   Allergen  Reaction  Noted   .  Darifenacin hydrobromide       .  Sulfonamide derivatives    01/29/2007   .  Tramadol  Other (See Comments)  11/13/2010       reports that she has never smoked. She has never used smokeless tobacco. She reports that she does not drink alcohol or use illicit drugs.         Review of Systems: Pertinent positive and negative review of systems were noted in the above HPI section. All other review of systems were otherwise negative.   Vital signs were reviewed in today's medical record Physical Exam: General: Well developed , well nourished, no acute  distress Skin: anicteric Head: Normocephalic and atraumatic Eyes:  sclerae anicteric, EOMI Ears: Normal auditory acuity Mouth: No deformity or lesions Neck: Supple, no masses or thyromegaly Lungs: Clear throughout to auscultation Heart: Regular rate and rhythm; no murmurs, rubs or bruits Abdomen: Soft, non tender and non distended. No masses, hepatosplenomegaly or hernias noted. Normal Bowel sounds Rectal:deferred Musculoskeletal: Symmetrical with no gross deformities. There is a moderate kyphosis   Skin: No lesions on visible extremities Pulses:  Normal pulses noted Extremities: No clubbing, cyanosis, edema or deformities noted Neurological: Alert oriented x 4, grossly nonfocal Cervical Nodes:  No significant cervical adenopathy Inguinal Nodes: No significant inguinal adenopathy Psychological:  Alert and cooperative. Normal mood and affect                      ACHALASIA - Louis Meckel, MD at 08/01/2012 11:36 AM    Status: Written Related Problem: ACHALASIA           Patient is symptomatic again from achalasia.   Recommendations #1 repeat Botox injection of the LES.

## 2012-09-03 NOTE — Op Note (Signed)
[facility] [address] [place], [address]   ENDOSCOPY PROCEDURE REPORT  PATIENT: [name], [name]  MR#: [id number] BIRTHDATE: 12-11-1924 , 88  yrs. old GENDER: Female ENDOSCOPIST: Louis Meckel, MD REFERRED BY: PROCEDURE DATE:  09/03/2012 PROCEDURE:  EGD w/ directed submucosal injection(s), any substance and EGD w/ biopsy ASA CLASS:     Class III INDICATIONS:  Botox injection of achalasia. MEDICATIONS: These medications were titrated to patient response per physician's verbal order, Versed 6 mg IV, and Fentanyl 50 mcg IV TOPICAL ANESTHETIC: Cetacaine Spray  DESCRIPTION OF PROCEDURE: After the risks benefits and alternatives of the procedure were thoroughly explained, informed consent was obtained.  The Pentax Gastroscope [id number] endoscope was introduced through the mouth and advanced to the third portion of the duodenum. Without limitations.  The instrument was slowly withdrawn as the mucosa was fully examined.      Again noted in the distal esophagus was a large diverticulum in the very distal esophagus packed with solid food.  The distal esophagus and GE junction were D.  symmetrical he located.  The scope passed without resistance.  There was a 3 mm sessile polyp in the gastric cardia that was biopsied.   Again noted in the distal esophagus was a large diverticulum in the very distal esophagus packed with solid food.  The distal esophagus and GE junction were D.  symmetrical he located.  The scope passed without resistance.  There was a 3 mm sessile polyp in the gastric cardia that was biopsied.   The remainder of the upper endoscopy exam was otherwise normal. Retroflexed views revealed no abnormalities.     At the GE junction 1 cc (25 units) of Botox was injected into each quadrant submucosally. The scope was then withdrawn from the patient and the procedure completed.  COMPLICATIONS: There were no complications. ENDOSCOPIC IMPRESSION: 1.   achalasia with large esophageal diverticulum-status post Botox injection 2.  gastric polyp RECOMMENDATIONS: 1.  repeat Botox injection as needed 2.  await biopsy report REPEAT EXAM:  eSigned:  Louis Meckel, MD 09/03/2012 1:05 PM   AO:ZHYQM Romilda Garret, MD  PATIENT NAME:  [name], [name] MR#: [id number]

## 2012-09-04 ENCOUNTER — Encounter (HOSPITAL_COMMUNITY): Payer: Self-pay | Admitting: Gastroenterology

## 2012-09-12 ENCOUNTER — Ambulatory Visit (INDEPENDENT_AMBULATORY_CARE_PROVIDER_SITE_OTHER): Payer: Medicare Other

## 2012-09-12 DIAGNOSIS — I4891 Unspecified atrial fibrillation: Secondary | ICD-10-CM

## 2012-09-12 DIAGNOSIS — Z7901 Long term (current) use of anticoagulants: Secondary | ICD-10-CM

## 2012-09-12 LAB — POCT INR: INR: 1.7

## 2012-09-23 ENCOUNTER — Telehealth: Payer: Self-pay | Admitting: *Deleted

## 2012-09-23 NOTE — Telephone Encounter (Signed)
Faxed Transferred Prescription received from [facility], transfer from [facility] [closing] pharmacy for Alprazolam Last filled by MD on 06.20.14, #[id number] [expires post 6-mths] Last AEX - 06.26.14 Next AEX - 3 Month, appt scheduled 09.25.14 Change noted in Demographics/SLS

## 2012-09-25 ENCOUNTER — Encounter: Payer: Self-pay | Admitting: Family Medicine

## 2012-09-25 ENCOUNTER — Ambulatory Visit (INDEPENDENT_AMBULATORY_CARE_PROVIDER_SITE_OTHER): Payer: Medicare Other | Admitting: Family Medicine

## 2012-09-25 VITALS — BP 128/68 | HR 98 | Temp 98.2°F | Ht 60.0 in | Wt 121.0 lb

## 2012-09-25 DIAGNOSIS — I10 Essential (primary) hypertension: Secondary | ICD-10-CM

## 2012-09-25 DIAGNOSIS — K220 Achalasia of cardia: Secondary | ICD-10-CM

## 2012-09-25 DIAGNOSIS — N289 Disorder of kidney and ureter, unspecified: Secondary | ICD-10-CM

## 2012-09-25 DIAGNOSIS — E785 Hyperlipidemia, unspecified: Secondary | ICD-10-CM

## 2012-09-25 DIAGNOSIS — D649 Anemia, unspecified: Secondary | ICD-10-CM

## 2012-09-25 LAB — LIPID PANEL
Cholesterol: 144 mg/dL (ref 0–200)
HDL: 34 mg/dL — ABNORMAL LOW (ref >39)
LDL Cholesterol: 66 mg/dL (ref 0–99)
Triglycerides: 220 mg/dL — ABNORMAL HIGH (ref <150)
VLDL: 44 mg/dL — ABNORMAL HIGH (ref 0–40)

## 2012-09-25 LAB — CBC
HCT: 36.2 % (ref 36.0–46.0)
MCHC: 34.3 g/dL (ref 30.0–36.0)
Platelets: 198 10*3/uL (ref 150–400)
RDW: 14.3 % (ref 11.5–15.5)
WBC: 5.3 10*3/uL (ref 4.0–10.5)

## 2012-09-25 LAB — HEPATIC FUNCTION PANEL
Alkaline Phosphatase: 16 U/L — ABNORMAL LOW (ref 39–117)
Indirect Bilirubin: 0.4 mg/dL (ref 0.0–0.9)
Total Bilirubin: 0.5 mg/dL (ref 0.3–1.2)

## 2012-09-25 LAB — RENAL FUNCTION PANEL
Calcium: 9.6 mg/dL (ref 8.4–10.5)
Chloride: 102 mEq/L (ref 96–112)
Phosphorus: 4.9 mg/dL — ABNORMAL HIGH (ref 2.3–4.6)
Potassium: 4.5 mEq/L (ref 3.5–5.3)
Sodium: 142 mEq/L (ref 135–145)

## 2012-09-25 MED ORDER — RAMIPRIL 10 MG PO CAPS: 10.0000 mg | ORAL_CAPSULE | Freq: Every day | ORAL | Status: DC

## 2012-09-25 NOTE — Patient Instructions (Addendum)

## 2012-09-26 ENCOUNTER — Ambulatory Visit (INDEPENDENT_AMBULATORY_CARE_PROVIDER_SITE_OTHER): Payer: Medicare Other

## 2012-09-26 ENCOUNTER — Ambulatory Visit: Payer: Medicare Other | Admitting: Family Medicine

## 2012-09-26 DIAGNOSIS — I4891 Unspecified atrial fibrillation: Secondary | ICD-10-CM

## 2012-09-26 DIAGNOSIS — Z7901 Long term (current) use of anticoagulants: Secondary | ICD-10-CM

## 2012-09-26 NOTE — Progress Notes (Signed)
Quick Note:  Patient Informed and voiced understanding ______ 

## 2012-09-27 ENCOUNTER — Encounter: Payer: Self-pay | Admitting: Family Medicine

## 2012-09-27 NOTE — Assessment & Plan Note (Signed)
resolved 

## 2012-09-27 NOTE — Progress Notes (Signed)
Patient ID: [name] [name], female   DOB: 1924/08/28, 79 y.o.   MRN: [id number] [name] [name] [id number] [birth year] 09/27/2012      Progress Note-Follow Up  Subjective  Chief Complaint  Chief Complaint  Patient presents with  . Follow-up    3 month    HPI  Patient is an 79 year old Caucasian female who is in today for followup. Generally feeling well. Is having a lot less trouble with choking and reports she's eating better. She's not having any acute concerns. No chest pain, palpitations, shortness of breath, GI or GU concerns at this time. She is taking medications as prescribed and generally tolerating her Coumadin. Bowels are moving well.  Past Medical History  Diagnosis Date  . Paroxysmal a-fib   . Mitral and aortic regurgitation   . Mitral valve prolapse     hx of  . Hypertension   . Hyperlipidemia   . Vitamin D deficiency   . Pneumonia   . Tracheitis   . Gait difficulty   . Anemia   . Melena   . IBS (irritable bowel syndrome)   . Incontinence   . GERD (gastroesophageal reflux disease)   . Osteopenia   . Thyroid disease     hypo  . Personal history of colonic polyps   . Current use of long term anticoagulation   . Cough   . Diarrhea   . Pneumonia     organism unspecified  . Constipation   . UTI (lower urinary tract infection)   . Unspecified adverse effect of other drug, medicinal and biological substance(995.29)   . History of mammogram 2009  . Diverticulosis   . GERD (gastroesophageal reflux disease)   . Hyperlipidemia   . Atrial fibrillation   . Arthritis     hips, knees  . Headache(784.0) 06/22/2012  . Renal insufficiency 06/28/2012  . Hyperkalemia 06/28/2012    Past Surgical History  Procedure Laterality Date  . Breast biopsy Left   . Cardiac electrophysiology mapping and ablation    . Abdominal hysterectomy      partial, ovaries left in place  . Cataract extraction, bilateral    . Esophagoscopy w/ botox injection    .  Esophagogastroduodenoscopy N/A 09/03/2012    Procedure: ESOPHAGOGASTRODUODENOSCOPY (EGD);  Surgeon: Louis Meckel, MD;  Location: [facility];  Service: Endoscopy;  Laterality: N/A;  . Botox injection N/A 09/03/2012    Procedure: BOTOX INJECTION;  Surgeon: Louis Meckel, MD;  Location: [facility];  Service: Endoscopy;  Laterality: N/A;    Family History  Problem Relation Age of Onset  . Diabetes Mother   . Stroke Mother   . COPD Father   . Heart disease Maternal Aunt   . Heart disease Maternal Uncle   . Allergies Daughter   . COPD Daughter   . Stroke Daughter   . COPD Daughter     previous smoker  . Stroke Maternal Grandfather     History   Social History  . Marital Status: Widowed    Spouse Name: N/A    Number of Children: 3  . Years of Education: N/A   Occupational History  .     Social History Main Topics  . Smoking status: Never Smoker   . Smokeless tobacco: Never Used  . Alcohol Use: No  . Drug Use: No  . Sexual Activity: No   Other Topics Concern  . Not on file   Social History Narrative  . No narrative on file  Current Outpatient Prescriptions on File Prior to Visit  Medication Sig Dispense Refill  . ALPRAZolam (XANAX) 0.25 MG tablet Take 1 tablet (0.25 mg total) by mouth 3 (three) times daily. For stress  90 tablet  5  . atorvastatin (LIPITOR) 10 MG tablet Take 1 tablet (10 mg total) by mouth daily.  30 tablet  5  . calcium acetate (PHOSLO) 667 MG tablet Take 1 tablet by mouth 2 (two) times daily.  60 tablet  2  . Cholecalciferol (VITAMIN D) 2000 UNITS CAPS Take 1 capsule (2,000 Units total) by mouth daily.  30 capsule    . metoprolol tartrate (LOPRESSOR) 25 MG tablet Take 0.5 tablets (12.5 mg total) by mouth 2 (two) times daily. bid  90 tablet  3  . omeprazole-sodium bicarbonate (ZEGERID) 40-1100 MG per capsule Take one tab at bedtime  90 capsule  3  . vitamin B-12 (CYANOCOBALAMIN) 1000 MCG tablet Take 1 tablet (1,000 mcg total) by mouth daily.        No current facility-administered medications on file prior to visit.    Allergies  Allergen Reactions  . Darifenacin Hydrobromide     REACTION: causes her dizziness  . Sulfonamide Derivatives   . Tramadol Other (See Comments)    Insomnia, anorexia    Review of Systems  Review of Systems  Constitutional: Negative for fever and malaise/fatigue.  HENT: Negative for congestion.   Eyes: Negative for discharge.  Respiratory: Negative for shortness of breath.   Cardiovascular: Negative for chest pain, palpitations and leg swelling.  Gastrointestinal: Negative for nausea, abdominal pain and diarrhea.  Genitourinary: Negative for dysuria.  Musculoskeletal: Negative for falls.  Skin: Negative for rash.  Neurological: Negative for loss of consciousness and headaches.  Endo/Heme/Allergies: Negative for polydipsia.  Psychiatric/Behavioral: Negative for depression and suicidal ideas. The patient is not nervous/anxious and does not have insomnia.     Objective  BP 128/68  Pulse 98  Temp(Src) 98.2 F (36.8 C) (Oral)  Ht 5' (1.524 m)  Wt 121 lb 0.6 oz (54.903 kg)  BMI 23.64 kg/m2  SpO2 97%  Physical Exam  Physical Exam  Constitutional: She is oriented to person, place, and time and well-developed, well-nourished, and in no distress. No distress.  HENT:  Head: Normocephalic and atraumatic.  Eyes: Conjunctivae are normal.  Neck: Neck supple. No thyromegaly present.  Cardiovascular: Normal rate, regular rhythm and normal heart sounds.   No murmur heard. Pulmonary/Chest: Effort normal and breath sounds normal. She has no wheezes.  Abdominal: She exhibits no distension and no mass.  Musculoskeletal: She exhibits no edema.  Lymphadenopathy:    She has no cervical adenopathy.  Neurological: She is alert and oriented to person, place, and time.  Skin: Skin is warm and dry. No rash noted. She is not diaphoretic.  Psychiatric: Memory, affect and judgment normal.    Lab Results   Component Value Date   TSH 4.323 09/25/2012   Lab Results  Component Value Date   WBC 5.3 09/25/2012   HGB 12.4 09/25/2012   HCT 36.2 09/25/2012   MCV 88.5 09/25/2012   PLT 198 09/25/2012   Lab Results  Component Value Date   CREATININE 1.12* 09/25/2012   BUN 10 09/25/2012   NA 142 09/25/2012   K 4.5 09/25/2012   CL 102 09/25/2012   CO2 27 09/25/2012   Lab Results  Component Value Date   ALT 14 09/25/2012   AST 19 09/25/2012   ALKPHOS 16* 09/25/2012   BILITOT 0.5 09/25/2012  Lab Results  Component Value Date   CHOL 144 09/25/2012   Lab Results  Component Value Date   HDL 34* 09/25/2012   Lab Results  Component Value Date   LDLCALC 66 09/25/2012   Lab Results  Component Value Date   TRIG 220* 09/25/2012   Lab Results  Component Value Date   CHOLHDL 4.2 09/25/2012     Assessment & Plan  HYPERTENSION Improved control on recheck no changes.   ACHALASIA Has recently seen GI and is doing better. Avoid offending foods.   Renal insufficiency Stable on curent meds.  ANEMIA resolved  HYPERLIPIDEMIA Tolerating Atorvastatin, avoid trans fats.

## 2012-09-27 NOTE — Assessment & Plan Note (Signed)
Stable on curent meds 

## 2012-09-27 NOTE — Assessment & Plan Note (Signed)
Improved control on recheck no changes 

## 2012-09-27 NOTE — Assessment & Plan Note (Signed)
Tolerating Atorvastatin, avoid trans fats 

## 2012-09-27 NOTE — Assessment & Plan Note (Signed)
Has recently seen GI and is doing better. Avoid offending foods.

## 2012-10-07 ENCOUNTER — Encounter: Payer: Self-pay | Admitting: *Deleted

## 2012-10-08 ENCOUNTER — Other Ambulatory Visit: Payer: Self-pay | Admitting: *Deleted

## 2012-10-08 DIAGNOSIS — N289 Disorder of kidney and ureter, unspecified: Secondary | ICD-10-CM

## 2012-10-08 MED ORDER — CALCIUM ACETATE (PHOS BINDER) 667 MG PO TABS: 667.0000 mg | ORAL_TABLET | Freq: Two times a day (BID) | ORAL | Status: DC

## 2012-10-08 NOTE — Telephone Encounter (Signed)
Rx request to pharmacy/SLS  

## 2012-10-10 ENCOUNTER — Ambulatory Visit (INDEPENDENT_AMBULATORY_CARE_PROVIDER_SITE_OTHER): Payer: Medicare Other | Admitting: General Practice

## 2012-10-10 ENCOUNTER — Encounter: Payer: Self-pay | Admitting: Cardiology

## 2012-10-10 ENCOUNTER — Ambulatory Visit (INDEPENDENT_AMBULATORY_CARE_PROVIDER_SITE_OTHER): Payer: Medicare Other | Admitting: Cardiology

## 2012-10-10 VITALS — BP 156/70 | HR 60 | Ht 60.0 in | Wt 120.2 lb

## 2012-10-10 DIAGNOSIS — I351 Nonrheumatic aortic (valve) insufficiency: Secondary | ICD-10-CM

## 2012-10-10 DIAGNOSIS — I359 Nonrheumatic aortic valve disorder, unspecified: Secondary | ICD-10-CM

## 2012-10-10 DIAGNOSIS — Z7901 Long term (current) use of anticoagulants: Secondary | ICD-10-CM

## 2012-10-10 DIAGNOSIS — I4891 Unspecified atrial fibrillation: Secondary | ICD-10-CM

## 2012-10-10 NOTE — Progress Notes (Signed)
Patient Name: [name] [name] Date of Encounter: 10/10/2012  Primary Care Provider:  Danise Edge, MD Primary Cardiologist:  Tobias Alexander, H  Patient Profile  1 year follow up  Problem List   Past Medical History  Diagnosis Date  . Paroxysmal a-fib   . Mitral and aortic regurgitation   . Mitral valve prolapse     hx of  . Hypertension   . Hyperlipidemia   . Vitamin D deficiency   . Pneumonia   . Tracheitis   . Gait difficulty   . Anemia   . Melena   . IBS (irritable bowel syndrome)   . Incontinence   . GERD (gastroesophageal reflux disease)   . Osteopenia   . Thyroid disease     hypo  . Personal history of colonic polyps   . Current use of long term anticoagulation   . Cough   . Diarrhea   . Pneumonia     organism unspecified  . Constipation   . UTI (lower urinary tract infection)   . Unspecified adverse effect of other drug, medicinal and biological substance(995.29)   . History of mammogram 2009  . Diverticulosis   . GERD (gastroesophageal reflux disease)   . Hyperlipidemia   . Atrial fibrillation   . Arthritis     hips, knees  . Headache(784.0) 06/22/2012  . Renal insufficiency 06/28/2012  . Hyperkalemia 06/28/2012   Past Surgical History  Procedure Laterality Date  . Breast biopsy Left   . Cardiac electrophysiology mapping and ablation    . Abdominal hysterectomy      partial, ovaries left in place  . Cataract extraction, bilateral    . Esophagoscopy w/ botox injection    . Esophagogastroduodenoscopy N/A 09/03/2012    Procedure: ESOPHAGOGASTRODUODENOSCOPY (EGD);  Surgeon: Louis Meckel, MD;  Location: [facility];  Service: Endoscopy;  Laterality: N/A;  . Botox injection N/A 09/03/2012    Procedure: BOTOX INJECTION;  Surgeon: Louis Meckel, MD;  Location: [facility];  Service: Endoscopy;  Laterality: N/A;    Allergies  Allergies  Allergen Reactions  . Darifenacin Hydrobromide     REACTION: causes her dizziness  . Sulfonamide  Derivatives   . Tramadol Other (See Comments)    Insomnia, anorexia    HPI  [name] [name] returns today for evaluation and management of her history of paroxysmal A. fib, anticoagulation, history of mild aortic and mitral regurgitation, and history of mitral valve prolapse.  She is a former patient of Dr Daleen Squibb, this is her follow up after 1 year. She feels very well, she is still very independent, driving, shopping. She only noticed getting more fatigued with exertion. No chest pain, shortness of breath, palpitations or syncope.  Home Medications  Prior to Admission medications   Medication Sig Start Date End Date Taking? Authorizing Provider  warfarin (COUMADIN) 2.5 MG tablet Take 2.5 mg by mouth as directed. Mon, wed, Friday 1.5 tab 08/15/12  Yes Gaylord Shih, MD  ALPRAZolam Prudy Feeler) 0.25 MG tablet Take 1 tablet (0.25 mg total) by mouth 3 (three) times daily. For stress 06/20/12 06/20/13  Bradd Canary, MD  atorvastatin (LIPITOR) 10 MG tablet Take 1 tablet (10 mg total) by mouth daily. 05/11/12   Bradd Canary, MD  calcium acetate (PHOSLO) 667 MG tablet Take 1 tablet by mouth 2 (two) times daily. 10/08/12   Bradd Canary, MD  Cholecalciferol (VITAMIN D) 2000 UNITS CAPS Take 2,000 Units by mouth every other day. 02/20/12   Bryon Lions  Abner Greenspan, MD  metoprolol tartrate (LOPRESSOR) 25 MG tablet Take 0.5 tablets (12.5 mg total) by mouth 2 (two) times daily. bid 02/20/12   Bradd Canary, MD  omeprazole-sodium bicarbonate (ZEGERID) 40-1100 MG per capsule Take one tab at bedtime 08/01/12   Louis Meckel, MD  ramipril (ALTACE) 10 MG capsule Take 1 capsule (10 mg total) by mouth daily. 09/25/12   Bradd Canary, MD  vitamin B-12 (CYANOCOBALAMIN) 1000 MCG tablet Take 1 tablet (1,000 mcg total) by mouth daily. 02/20/12   Bradd Canary, MD    Family History  Family History  Problem Relation Age of Onset  . Diabetes Mother   . CVA Mother   . COPD Father   . Heart disease Maternal Aunt   . Heart disease  Maternal Uncle   . Allergies Daughter   . COPD Daughter   . Stroke Daughter   . COPD Daughter     previous smoker  . Stroke Maternal Grandfather     Social History  History   Social History  . Marital Status: Widowed    Spouse Name: N/A    Number of Children: 3  . Years of Education: N/A   Occupational History  .     Social History Main Topics  . Smoking status: Never Smoker   . Smokeless tobacco: Never Used  . Alcohol Use: No  . Drug Use: No  . Sexual Activity: No   Other Topics Concern  . Not on file   Social History Narrative  . No narrative on file     Review of Systems General:  No chills, fever, night sweats or weight changes.  Cardiovascular:  No chest pain, dyspnea on exertion, edema, orthopnea, palpitations, paroxysmal nocturnal dyspnea. Dermatological: No rash, lesions/masses Respiratory: No cough, dyspnea Urologic: No hematuria, dysuria Abdominal:   No nausea, vomiting, diarrhea, bright red blood per rectum, melena, or hematemesis Neurologic:  No visual changes, wkns, changes in mental status. All other systems reviewed and are otherwise negative except as noted above.  Physical Exam  BP 156/70, HR 60 General: Pleasant, NAD Psych: Normal affect. Neuro: Alert and oriented X 3. Moves all extremities spontaneously. HEENT: Normal  Neck: Supple without bruits or JVD. Lungs:  Resp regular and unlabored, CTA. Heart: RRR no s3, s4, or murmurs. Abdomen: Soft, non-tender, non-distended, BS + x 4.  Extremities: No clubbing, cyanosis or edema. DP/PT/Radials 2+ and equal bilaterally.  Accessory Clinical Findings  ECG - A-fib, 60 BPM  Lipid Panel     Component Value Date/Time   CHOL 144 09/25/2012 1221   TRIG 220* 09/25/2012 1221   HDL 34* 09/25/2012 1221   CHOLHDL 4.2 09/25/2012 1221   VLDL 44* 09/25/2012 1221   LDLCALC 66 09/25/2012 1221     Assessment & Plan  79 year old female with h/o HTN, AI and MR, mitral valve prolapse  1. AI, MR - We will  order echocardiogram to assess severity, LV size and function, intracardiac pressures  2. Hypertension - elevated, however, we will wait for the echo results and decide about BP control as the studies show that tight BP control might not be benefitial in geriatric population. In case there is significant AI and LV dilatation we will increase her ACEI.  3. Lipid profile - low HDL, high TAG, we will increase Crestor to 20 mg QHS   Follow up in 1 year, we will call with the results of the echo and potential changes to the BP meds.  Tobias Alexander, Rexene Edison, MD 10/10/2012, 11:21 AM

## 2012-10-10 NOTE — Patient Instructions (Signed)

## 2012-10-27 ENCOUNTER — Ambulatory Visit (HOSPITAL_COMMUNITY): Payer: Medicare Other | Attending: Cardiovascular Disease | Admitting: Radiology

## 2012-10-27 DIAGNOSIS — I079 Rheumatic tricuspid valve disease, unspecified: Secondary | ICD-10-CM | POA: Insufficient documentation

## 2012-10-27 DIAGNOSIS — I351 Nonrheumatic aortic (valve) insufficiency: Secondary | ICD-10-CM

## 2012-10-27 DIAGNOSIS — E785 Hyperlipidemia, unspecified: Secondary | ICD-10-CM | POA: Insufficient documentation

## 2012-10-27 DIAGNOSIS — I4891 Unspecified atrial fibrillation: Secondary | ICD-10-CM

## 2012-10-27 DIAGNOSIS — I059 Rheumatic mitral valve disease, unspecified: Secondary | ICD-10-CM | POA: Insufficient documentation

## 2012-10-27 DIAGNOSIS — I498 Other specified cardiac arrhythmias: Secondary | ICD-10-CM | POA: Insufficient documentation

## 2012-10-27 DIAGNOSIS — E039 Hypothyroidism, unspecified: Secondary | ICD-10-CM | POA: Insufficient documentation

## 2012-10-27 DIAGNOSIS — I10 Essential (primary) hypertension: Secondary | ICD-10-CM | POA: Insufficient documentation

## 2012-10-27 NOTE — Progress Notes (Signed)
Echocardiogram performed.  

## 2012-11-06 ENCOUNTER — Emergency Department (HOSPITAL_BASED_OUTPATIENT_CLINIC_OR_DEPARTMENT_OTHER): Payer: Medicare Other

## 2012-11-06 ENCOUNTER — Emergency Department (HOSPITAL_COMMUNITY): Payer: Medicare Other

## 2012-11-06 ENCOUNTER — Encounter (HOSPITAL_COMMUNITY): Payer: Medicare Other | Admitting: Anesthesiology

## 2012-11-06 ENCOUNTER — Emergency Department (HOSPITAL_BASED_OUTPATIENT_CLINIC_OR_DEPARTMENT_OTHER)
Admission: EM | Admit: 2012-11-06 | Discharge: 2012-11-06 | Disposition: A | Discharge status: Home / Self Care | Payer: Medicare Other | Attending: Emergency Medicine | Admitting: Emergency Medicine

## 2012-11-06 ENCOUNTER — Emergency Department (HOSPITAL_COMMUNITY): Payer: Medicare Other | Admitting: Anesthesiology

## 2012-11-06 ENCOUNTER — Encounter (HOSPITAL_BASED_OUTPATIENT_CLINIC_OR_DEPARTMENT_OTHER): Payer: Self-pay | Admitting: Emergency Medicine

## 2012-11-06 ENCOUNTER — Encounter (HOSPITAL_COMMUNITY)
Admission: EM | Disposition: A | Discharge status: Home / Self Care | Payer: Self-pay | Source: Home / Self Care | Attending: Emergency Medicine

## 2012-11-06 DIAGNOSIS — I4891 Unspecified atrial fibrillation: Secondary | ICD-10-CM | POA: Insufficient documentation

## 2012-11-06 DIAGNOSIS — Z79899 Other long term (current) drug therapy: Secondary | ICD-10-CM | POA: Insufficient documentation

## 2012-11-06 DIAGNOSIS — I10 Essential (primary) hypertension: Secondary | ICD-10-CM | POA: Insufficient documentation

## 2012-11-06 DIAGNOSIS — M79609 Pain in unspecified limb: Secondary | ICD-10-CM | POA: Insufficient documentation

## 2012-11-06 DIAGNOSIS — E039 Hypothyroidism, unspecified: Secondary | ICD-10-CM | POA: Insufficient documentation

## 2012-11-06 DIAGNOSIS — Z7901 Long term (current) use of anticoagulants: Secondary | ICD-10-CM | POA: Insufficient documentation

## 2012-11-06 DIAGNOSIS — M7989 Other specified soft tissue disorders: Secondary | ICD-10-CM | POA: Insufficient documentation

## 2012-11-06 DIAGNOSIS — K219 Gastro-esophageal reflux disease without esophagitis: Secondary | ICD-10-CM | POA: Insufficient documentation

## 2012-11-06 DIAGNOSIS — M1A00X1 Idiopathic chronic gout, unspecified site, with tophus (tophi): Secondary | ICD-10-CM | POA: Insufficient documentation

## 2012-11-06 HISTORY — PX: I & D EXTREMITY: SHX5045

## 2012-11-06 LAB — BASIC METABOLIC PANEL
CO2: 31 mEq/L (ref 19–32)
Calcium: 9.5 mg/dL (ref 8.4–10.5)
Creatinine, Ser: 1.1 mg/dL (ref 0.50–1.10)
GFR calc non Af Amer: 44 mL/min — ABNORMAL LOW (ref >90)
Glucose, Bld: 99 mg/dL (ref 70–99)
Sodium: 141 mEq/L (ref 135–145)

## 2012-11-06 LAB — CBC WITH DIFFERENTIAL/PLATELET
Eosinophils Absolute: 0.1 10*3/uL (ref 0.0–0.7)
Eosinophils Relative: 1 % (ref 0–5)
HCT: 36.5 % (ref 36.0–46.0)
Lymphocytes Relative: 25 % (ref 12–46)
Lymphs Abs: 1.6 10*3/uL (ref 0.7–4.0)
MCH: 30.6 pg (ref 26.0–34.0)
MCV: 92.4 fL (ref 78.0–100.0)
Monocytes Absolute: 0.4 10*3/uL (ref 0.1–1.0)
Platelets: 169 10*3/uL (ref 150–400)
RBC: 3.95 MIL/uL (ref 3.87–5.11)
RDW: 13.5 % (ref 11.5–15.5)
WBC: 6.3 10*3/uL (ref 4.0–10.5)

## 2012-11-06 SURGERY — IRRIGATION AND DEBRIDEMENT EXTREMITY
Anesthesia: General | Site: Finger | Laterality: Right | Wound class: Dirty or Infected

## 2012-11-06 MED ORDER — EPHEDRINE SULFATE 50 MG/ML IJ SOLN
INTRAMUSCULAR | Status: DC | PRN
  Administered 2012-11-06: 5 mg via INTRAVENOUS

## 2012-11-06 MED ORDER — FENTANYL CITRATE 0.05 MG/ML IJ SOLN
INTRAMUSCULAR | Status: DC | PRN
  Administered 2012-11-06: 25 ug via INTRAVENOUS
  Administered 2012-11-06: 50 ug via INTRAVENOUS

## 2012-11-06 MED ORDER — HYDROCODONE-ACETAMINOPHEN 5-325 MG PO TABS: ORAL_TABLET | ORAL | Status: DC

## 2012-11-06 MED ORDER — SUCCINYLCHOLINE CHLORIDE 20 MG/ML IJ SOLN
INTRAMUSCULAR | Status: DC | PRN
  Administered 2012-11-06: 100 mg via INTRAVENOUS

## 2012-11-06 MED ORDER — BUPIVACAINE HCL (PF) 0.25 % IJ SOLN
INTRAMUSCULAR | Status: DC | PRN
  Administered 2012-11-06: 9 mL

## 2012-11-06 MED ORDER — CEFAZOLIN SODIUM 1-5 GM-% IV SOLN
INTRAVENOUS | Status: AC
  Filled 2012-11-06: qty 100

## 2012-11-06 MED ORDER — SODIUM CHLORIDE 0.9 % IR SOLN
Status: DC | PRN
  Administered 2012-11-06: 1000 mL

## 2012-11-06 MED ORDER — BUPIVACAINE HCL (PF) 0.25 % IJ SOLN
INTRAMUSCULAR | Status: AC
  Filled 2012-11-06: qty 30

## 2012-11-06 MED ORDER — SODIUM CHLORIDE 0.9 % IR SOLN: Status: DC | PRN

## 2012-11-06 MED ORDER — PROPOFOL 10 MG/ML IV BOLUS
INTRAVENOUS | Status: DC | PRN
  Administered 2012-11-06: 100 mg via INTRAVENOUS

## 2012-11-06 MED ORDER — DROPERIDOL 2.5 MG/ML IJ SOLN: 0.6250 mg | INTRAMUSCULAR | Status: DC | PRN

## 2012-11-06 MED ORDER — METOPROLOL TARTRATE 25 MG PO TABS
12.5000 mg | ORAL_TABLET | Freq: Once | ORAL | Status: AC
  Administered 2012-11-06: 12.5 mg via ORAL
  Filled 2012-11-06: qty 1

## 2012-11-06 MED ORDER — LACTATED RINGERS IV SOLN
INTRAVENOUS | Status: DC | PRN
  Administered 2012-11-06: 22:00:00 via INTRAVENOUS

## 2012-11-06 MED ORDER — CEFAZOLIN SODIUM-DEXTROSE 2-3 GM-% IV SOLR
INTRAVENOUS | Status: DC | PRN
  Administered 2012-11-06: 2 g via INTRAVENOUS

## 2012-11-06 MED ORDER — ONDANSETRON HCL 4 MG/2ML IJ SOLN
INTRAMUSCULAR | Status: DC | PRN
  Administered 2012-11-06: 4 mg via INTRAVENOUS

## 2012-11-06 MED ORDER — CEPHALEXIN 500 MG PO CAPS: 500.0000 mg | ORAL_CAPSULE | Freq: Three times a day (TID) | ORAL | Status: DC

## 2012-11-06 MED ORDER — FENTANYL CITRATE 0.05 MG/ML IJ SOLN: 25.0000 ug | INTRAMUSCULAR | Status: DC | PRN

## 2012-11-06 MED ORDER — ARTIFICIAL TEARS OP OINT
TOPICAL_OINTMENT | OPHTHALMIC | Status: DC | PRN
  Administered 2012-11-06: 1 via OPHTHALMIC

## 2012-11-06 MED ORDER — LIDOCAINE HCL (CARDIAC) 20 MG/ML IV SOLN
INTRAVENOUS | Status: DC | PRN
  Administered 2012-11-06: 40 mg via INTRAVENOUS

## 2012-11-06 SURGICAL SUPPLY — 55 items
BANDAGE COBAN STERILE 2 (GAUZE/BANDAGES/DRESSINGS) IMPLANT
BANDAGE CONFORM 2  STR LF (GAUZE/BANDAGES/DRESSINGS) ×1 IMPLANT
BANDAGE ELASTIC 3 VELCRO ST LF (GAUZE/BANDAGES/DRESSINGS) ×1 IMPLANT
BANDAGE ELASTIC 4 VELCRO ST LF (GAUZE/BANDAGES/DRESSINGS) ×1 IMPLANT
BANDAGE GAUZE ELAST BULKY 4 IN (GAUZE/BANDAGES/DRESSINGS) ×1 IMPLANT
BNDG CMPR 9X4 STRL LF SNTH (GAUZE/BANDAGES/DRESSINGS)
BNDG COHESIVE 1X5 TAN STRL LF (GAUZE/BANDAGES/DRESSINGS) ×1 IMPLANT
BNDG ESMARK 4X9 LF (GAUZE/BANDAGES/DRESSINGS) IMPLANT
CLOTH BEACON ORANGE TIMEOUT ST (SAFETY) ×1 IMPLANT
CORDS BIPOLAR (ELECTRODE) ×2 IMPLANT
COVER SURGICAL LIGHT HANDLE (MISCELLANEOUS) ×2 IMPLANT
DECANTER SPIKE VIAL GLASS SM (MISCELLANEOUS) ×2 IMPLANT
DRAIN PENROSE 1/4X12 LTX STRL (WOUND CARE) IMPLANT
DRSG ADAPTIC 3X8 NADH LF (GAUZE/BANDAGES/DRESSINGS) IMPLANT
DRSG EMULSION OIL 3X3 NADH (GAUZE/BANDAGES/DRESSINGS) ×1 IMPLANT
DRSG PAD ABDOMINAL 8X10 ST (GAUZE/BANDAGES/DRESSINGS) ×2 IMPLANT
GAUZE XEROFORM 1X8 LF (GAUZE/BANDAGES/DRESSINGS) ×2 IMPLANT
GLOVE BIO SURGEON STRL SZ7.5 (GLOVE) ×3 IMPLANT
GLOVE BIOGEL PI IND STRL 7.5 (GLOVE) IMPLANT
GLOVE BIOGEL PI IND STRL 8 (GLOVE) ×1 IMPLANT
GLOVE BIOGEL PI INDICATOR 7.5 (GLOVE) ×1
GLOVE BIOGEL PI INDICATOR 8 (GLOVE) ×1
GLOVE SKINSENSE NS SZ7.5 (GLOVE) ×1
GLOVE SKINSENSE STRL SZ7.5 (GLOVE) IMPLANT
GOWN BRE IMP PREV XXLGXLNG (GOWN DISPOSABLE) ×2 IMPLANT
HANDPIECE INTERPULSE COAX TIP (DISPOSABLE)
KIT BASIN OR (CUSTOM PROCEDURE TRAY) ×2 IMPLANT
KIT ROOM TURNOVER OR (KITS) ×2 IMPLANT
LOOP VESSEL MAXI BLUE (MISCELLANEOUS) ×1 IMPLANT
LOOP VESSEL MINI RED (MISCELLANEOUS) IMPLANT
MANIFOLD NEPTUNE II (INSTRUMENTS) ×1 IMPLANT
NDL HYPO 25X1 1.5 SAFETY (NEEDLE) IMPLANT
NEEDLE HYPO 25X1 1.5 SAFETY (NEEDLE) ×2 IMPLANT
NS IRRIG 1000ML POUR BTL (IV SOLUTION) ×2 IMPLANT
PACK ORTHO EXTREMITY (CUSTOM PROCEDURE TRAY) ×2 IMPLANT
PAD ARMBOARD 7.5X6 YLW CONV (MISCELLANEOUS) ×4 IMPLANT
SCRUB BETADINE 4OZ XXX (MISCELLANEOUS) ×2 IMPLANT
SET HNDPC FAN SPRY TIP SCT (DISPOSABLE) IMPLANT
SOLUTION BETADINE 4OZ (MISCELLANEOUS) ×2 IMPLANT
SPONGE GAUZE 4X4 12PLY (GAUZE/BANDAGES/DRESSINGS) ×2 IMPLANT
SPONGE LAP 18X18 X RAY DECT (DISPOSABLE) ×1 IMPLANT
SPONGE LAP 4X18 X RAY DECT (DISPOSABLE) ×1 IMPLANT
SUCTION FRAZIER TIP 10 FR DISP (SUCTIONS) ×1 IMPLANT
SUT ETHILON 4 0 PS 2 18 (SUTURE) ×1 IMPLANT
SUT ETHILON 5 0 PS 2 18 (SUTURE) ×1 IMPLANT
SUT MON AB 5-0 P3 18 (SUTURE) IMPLANT
SYR CONTROL 10ML LL (SYRINGE) ×1 IMPLANT
TOWEL OR 17X24 6PK STRL BLUE (TOWEL DISPOSABLE) ×2 IMPLANT
TOWEL OR 17X26 10 PK STRL BLUE (TOWEL DISPOSABLE) ×2 IMPLANT
TUBE ANAEROBIC SPECIMEN COL (MISCELLANEOUS) ×2 IMPLANT
TUBE CONNECTING 12X1/4 (SUCTIONS) ×2 IMPLANT
TUBE FEEDING 5FR 15 INCH (TUBING) IMPLANT
UNDERPAD 30X30 INCONTINENT (UNDERPADS AND DIAPERS) ×2 IMPLANT
WATER STERILE IRR 1000ML POUR (IV SOLUTION) ×1 IMPLANT
YANKAUER SUCT BULB TIP NO VENT (SUCTIONS) ×2 IMPLANT

## 2012-11-06 NOTE — ED Notes (Signed)
Per Dr. Loretha Stapler- saline lock ok to keep in for transport to ED.

## 2012-11-06 NOTE — Anesthesia Preprocedure Evaluation (Addendum)
Anesthesia Evaluation  Patient identified by MRN, date of birth, ID band Patient awake    Reviewed: Allergy & Precautions, H&P , NPO status , Patient's Chart, lab work & pertinent test results, reviewed documented beta blocker date and time   History of Anesthesia Complications Negative for: history of anesthetic complications  Airway Mallampati: II TM Distance: >3 FB Neck ROM: Full    Dental  (+) Dental Advisory Given, Edentulous Upper and Edentulous Lower   Pulmonary neg pulmonary ROS,  breath sounds clear to auscultation  Pulmonary exam normal       Cardiovascular hypertension, Pt. on medications and Pt. on home beta blockers - angina+ dysrhythmias (coumadin: INR 2.39) Atrial Fibrillation Rhythm:Irregular Rate:Normal  10/14 ECHO: EF 55-60%, mitral and aortic valves OK   Neuro/Psych negative neurological ROS     GI/Hepatic Neg liver ROS, GERD-  Poorly Controlled and Medicated,  Endo/Other  Hypothyroidism   Renal/GU Renal InsufficiencyRenal disease (creat 1.10)     Musculoskeletal   Abdominal   Peds  Hematology   Anesthesia Other Findings   Reproductive/Obstetrics                       Anesthesia Physical Anesthesia Plan  ASA: III  Anesthesia Plan: General   Post-op Pain Management:    Induction: Intravenous, Rapid sequence and Cricoid pressure planned  Airway Management Planned: Oral ETT  Additional Equipment:   Intra-op Plan:   Post-operative Plan: Extubation in OR  Informed Consent: I have reviewed the patients History and Physical, chart, labs and discussed the procedure including the risks, benefits and alternatives for the proposed anesthesia with the patient or authorized representative who has indicated his/her understanding and acceptance.   Dental advisory given  Plan Discussed with: CRNA, Anesthesiologist and Surgeon  Anesthesia Plan Comments: (Plan routine monitors,  GETA)       Anesthesia Quick Evaluation

## 2012-11-06 NOTE — Anesthesia Postprocedure Evaluation (Signed)
  Anesthesia Post-op Note  Patient: [name] [name]  Procedure(s) Performed: Procedure(s): IRRIGATION AND DEBRIDEMENT EXTREMITY Right Ring Finger (Right)  Patient Location: PACU  Anesthesia Type:General  Level of Consciousness: awake, alert , oriented and patient cooperative  Airway and Oxygen Therapy: Patient Spontanous Breathing and Patient connected to nasal cannula oxygen  Post-op Pain: none  Post-op Assessment: Post-op Vital signs reviewed, Patient's Cardiovascular Status Stable, Respiratory Function Stable, Patent Airway, No signs of Nausea or vomiting and Pain level controlled  Post-op Vital Signs: Reviewed and stable  Complications: No apparent anesthesia complications

## 2012-11-06 NOTE — ED Notes (Signed)
Right hand swelling x 2 days.  Denies injury.

## 2012-11-06 NOTE — Transfer of Care (Signed)
Immediate Anesthesia Transfer of Care Note  Patient: [name] [name]  Procedure(s) Performed: Procedure(s): IRRIGATION AND DEBRIDEMENT EXTREMITY Right Ring Finger (Right)  Patient Location: PACU  Anesthesia Type:General  Level of Consciousness: awake, alert  and oriented  Airway & Oxygen Therapy: Patient Spontanous Breathing and Patient connected to nasal cannula oxygen  Post-op Assessment: Report given to PACU RN, Post -op Vital signs reviewed and stable and Patient moving all extremities  Post vital signs: Reviewed and stable  Complications: No apparent anesthesia complications

## 2012-11-06 NOTE — Anesthesia Procedure Notes (Signed)
Procedure Name: Intubation Date/Time: 11/06/2012 10:16 PM Performed by: Luster Landsberg Pre-anesthesia Checklist: Patient identified, Emergency Drugs available, Suction available and Patient being monitored Patient Re-evaluated:Patient Re-evaluated prior to inductionOxygen Delivery Method: Circle system utilized Preoxygenation: Pre-oxygenation with 100% oxygen Intubation Type: IV induction, Rapid sequence and Cricoid Pressure applied Laryngoscope Size: Mac and 3 Grade View: Grade I Tube type: Oral Tube size: 7.5 mm Number of attempts: 1 Airway Equipment and Method: Stylet Placement Confirmation: ETT inserted through vocal cords under direct vision,  positive ETCO2 and breath sounds checked- equal and bilateral Secured at: 21 cm Tube secured with: Tape Dental Injury: Teeth and Oropharynx as per pre-operative assessment

## 2012-11-06 NOTE — Op Note (Signed)
[id number] 

## 2012-11-06 NOTE — ED Notes (Signed)
DR Merlyn Lot AT BEDSIDE DISCUSSING OPTIONS FOR PLAN OF CARE WITH PATIENT.

## 2012-11-06 NOTE — Brief Op Note (Signed)
11/06/2012  11:04 PM  PATIENT:  [name] [name]  79 y.o. female  PRE-OPERATIVE DIAGNOSIS:  Right Ring Finger Infection  POST-OPERATIVE DIAGNOSIS:  right ring finger infection  PROCEDURE:  Procedure(s): IRRIGATION AND DEBRIDEMENT EXTREMITY Right Ring Finger (Right)  SURGEON:  Surgeon(s) and Role:    * Tami Ribas, MD - Primary  PHYSICIAN ASSISTANT:   ASSISTANTS: none   ANESTHESIA:   general  EBL:  Total I/O In: 500 [I.V.:500] Out: -   BLOOD ADMINISTERED:none  DRAINS: vessel loop drain in pip joint  LOCAL MEDICATIONS USED:  MARCAINE     SPECIMEN:  Source of Specimen:  right ring finger cultures and gouty tophus  DISPOSITION OF SPECIMEN:  micro and pathology  COUNTS:  YES  TOURNIQUET:   Total Tourniquet Time Documented: Upper Arm (Right) - 33 minutes Total: Upper Arm (Right) - 33 minutes   DICTATION: .Other Dictation: Dictation Number [id number]  PLAN OF CARE: Discharge to home after PACU  PATIENT DISPOSITION:  PACU - hemodynamically stable.

## 2012-11-06 NOTE — H&P (Signed)
[name] [name] is an 79 y.o. female.   Chief Complaint: right ring finger pain/swelling HPI: 79 yo rhd female states she has had increasing pain and swelling right ring finger over past 2 days.  Started noting redness in finger worsening today.  No known injury.  Does not remember any wound on finger.  No fevers, chills, night sweats.  Seen at [facility] and transferred for care.  Last PO before arrival at [facility] as patient not aware that she was to be NPO.  Past Medical History  Diagnosis Date  . Paroxysmal a-fib   . Mitral and aortic regurgitation   . Mitral valve prolapse     hx of  . Hypertension   . Hyperlipidemia   . Vitamin D deficiency   . Pneumonia   . Tracheitis   . Gait difficulty   . Anemia   . Melena   . IBS (irritable bowel syndrome)   . Incontinence   . GERD (gastroesophageal reflux disease)   . Osteopenia   . Thyroid disease     hypo  . Personal history of colonic polyps   . Current use of long term anticoagulation   . Cough   . Diarrhea   . Pneumonia     organism unspecified  . Constipation   . UTI (lower urinary tract infection)   . Unspecified adverse effect of other drug, medicinal and biological substance(995.29)   . History of mammogram 2009  . Diverticulosis   . GERD (gastroesophageal reflux disease)   . Hyperlipidemia   . Atrial fibrillation   . Arthritis     hips, knees  . Headache(784.0) 06/22/2012  . Renal insufficiency 06/28/2012  . Hyperkalemia 06/28/2012    Past Surgical History  Procedure Laterality Date  . Breast biopsy Left   . Cardiac electrophysiology mapping and ablation    . Abdominal hysterectomy      partial, ovaries left in place  . Cataract extraction, bilateral    . Esophagoscopy w/ botox injection    . Esophagogastroduodenoscopy N/A 09/03/2012    Procedure: ESOPHAGOGASTRODUODENOSCOPY (EGD);  Surgeon: Louis Meckel, MD;  Location: [facility];  Service: Endoscopy;  Laterality: N/A;  . Botox injection N/A 09/03/2012    Procedure:  BOTOX INJECTION;  Surgeon: Louis Meckel, MD;  Location: [facility];  Service: Endoscopy;  Laterality: N/A;    Family History  Problem Relation Age of Onset  . Diabetes Mother   . CVA Mother   . COPD Father   . Heart disease Maternal Aunt   . Heart disease Maternal Uncle   . Allergies Daughter   . COPD Daughter   . Stroke Daughter   . COPD Daughter     previous smoker  . Stroke Maternal Grandfather    Social History:  reports that she has never smoked. She has never used smokeless tobacco. She reports that she does not drink alcohol or use illicit drugs.  Allergies:  Allergies  Allergen Reactions  . Darifenacin Hydrobromide     REACTION: causes her dizziness  . Sulfonamide Derivatives   . Tramadol Other (See Comments)    Insomnia, anorexia     (Not in a hospital admission)  Results for orders placed during the hospital encounter of 11/06/12 (from the past 48 hour(s))  CBC WITH DIFFERENTIAL     Status: None   Collection Time    11/06/12 12:05 PM      Result Value Range   WBC 6.3  4.0 - 10.5 K/uL   RBC  3.95  3.87 - 5.11 MIL/uL   Hemoglobin 12.1  12.0 - 15.0 g/dL   HCT 45.4  09.8 - 11.9 %   MCV 92.4  78.0 - 100.0 fL   MCH 30.6  26.0 - 34.0 pg   MCHC 33.2  30.0 - 36.0 g/dL   RDW 14.7  82.9 - 56.2 %   Platelets 169  150 - 400 K/uL   Neutrophils Relative % 68  43 - 77 %   Neutro Abs 4.2  1.7 - 7.7 K/uL   Lymphocytes Relative 25  12 - 46 %   Lymphs Abs 1.6  0.7 - 4.0 K/uL   Monocytes Relative 6  3 - 12 %   Monocytes Absolute 0.4  0.1 - 1.0 K/uL   Eosinophils Relative 1  0 - 5 %   Eosinophils Absolute 0.1  0.0 - 0.7 K/uL   Basophils Relative 1  0 - 1 %   Basophils Absolute 0.0  0.0 - 0.1 K/uL  BASIC METABOLIC PANEL     Status: Abnormal   Collection Time    11/06/12 12:05 PM      Result Value Range   Sodium 141  135 - 145 mEq/L   Potassium 3.7  3.5 - 5.1 mEq/L   Chloride 102  96 - 112 mEq/L   CO2 31  19 - 32 mEq/L   Glucose, Bld 99  70 - 99 mg/dL   BUN 17   6 - 23 mg/dL   Creatinine, Ser 1.30  0.50 - 1.10 mg/dL   Calcium 9.5  8.4 - 86.5 mg/dL   GFR calc non Af Amer 44 (*) >90 mL/min   GFR calc Af Amer 51 (*) >90 mL/min   Comment: (NOTE)     The eGFR has been calculated using the CKD EPI equation.     This calculation has not been validated in all clinical situations.     eGFR's persistently <90 mL/min signify possible Chronic Kidney     Disease.  PROTIME-INR     Status: Abnormal   Collection Time    11/06/12 12:05 PM      Result Value Range   Prothrombin Time 25.3 (*) 11.6 - 15.2 seconds   INR 2.39 (*) 0.00 - 1.49    Dg Chest 2 View  11/06/2012   CLINICAL DATA:  Right hand swelling and pain.  EXAM: CHEST  2 VIEW  COMPARISON:  06/01/2008 chest radiograph.  FINDINGS: Unchanged exaggerated thoracic kyphosis. Cardiopericardial silhouette within normal limits with chronic scarring at the apex. Bilateral pleural apical scarring and emphysematous changes are present. Large hiatal hernia. Aortic arch atherosclerosis. Calcification of the tracheobronchial tree. No pleural fluid. No pneumothorax.  IMPRESSION: No acute cardiopulmonary disease. Chronic changes of the chest and large hiatal hernia.   Electronically Signed   By: Andreas Newport M.D.   On: 11/06/2012 19:33   Dg Hand Complete Right  11/06/2012   CLINICAL DATA:  Right hand pain and swelling, no known injury  EXAM: RIGHT HAND - COMPLETE 3+ VIEW  COMPARISON:  None.  FINDINGS: Three views of the right hand submitted. No acute fracture or subluxation. Degenerative changes noted proximal interphalangeal joint 1st 3rd and 4th finger. Soft tissue swelling noted 4th finger. There is soft tissue swelling dorsal metacarpophalangeal joints region. There are degenerative changes distal interphalangeal joints 2nd 3rd 4th and 5th finger. Mild narrowing of radiocarpal joint space.  IMPRESSION: No acute fracture or subluxation. Degenerative changes as described above. Soft tissue swelling.  Electronically Signed    By: Natasha Mead M.D.   On: 11/06/2012 11:46     A comprehensive review of systems was negative.  Blood pressure 148/60, pulse 61, temperature 98 F (36.7 C), temperature source Oral, resp. rate 20, height 4\' 11"  (1.499 m), weight 121 lb (54.885 kg), SpO2 96.00%.  General appearance: alert, cooperative and appears stated age Head: Normocephalic, without obvious abnormality, atraumatic Neck: supple, symmetrical, trachea midline Resp: clear to auscultation bilaterally Cardio: regular rate and rhythm GI: non tender Extremities: intact sensation and capillary refill all digits.  +epl/fpl/io.  right ring finger swollen with erythema dorsally.  pain mostly at radial side of pip joint.  minimal pain elsewhere.  erythema onto dorsum of hand.  no tenderness of flexor sheath.  pain at radial side of pip with motion of joint.  no proximal streaks. Pulses: 2+ and symmetric Skin: Skin color, texture, turgor normal. No rashes or lesions Neurologic: Grossly normal Incision/Wound: none  Assessment/Plan Right ring finger pip joint infection vs inflammation.  Discussed trying antibiotics and antiinflammatories with follow up in office tomorrow vs operative drainage today with patient.  She wished to proceed with surgical drainage.  Risks, benefits, and alternatives of surgery were discussed and the patient agrees with the plan of care.   Kiandre Spagnolo R 11/06/2012, 10:07 PM

## 2012-11-06 NOTE — ED Provider Notes (Signed)
CSN: [id number]     Arrival date & time 11/06/12  1106 History   First MD Initiated Contact with Patient 11/06/12 1117     Chief Complaint  Patient presents with  . Hand Pain   (Consider location/radiation/quality/duration/timing/severity/associated sxs/prior Treatment) HPI Comments: 79 year old female with right ring finger pain for 2 days. Has now developed swelling and redness. No fevers.  Patient is a 79 y.o. female presenting with hand pain.  Hand Pain This is a new problem. The current episode started 2 days ago. The problem occurs constantly. The problem has been gradually worsening. Pertinent negatives include no chest pain, no abdominal pain and no shortness of breath. The symptoms are aggravated by bending. Nothing relieves the symptoms. She has tried nothing for the symptoms.    Past Medical History  Diagnosis Date  . Paroxysmal a-fib   . Mitral and aortic regurgitation   . Mitral valve prolapse     hx of  . Hypertension   . Hyperlipidemia   . Vitamin D deficiency   . Pneumonia   . Tracheitis   . Gait difficulty   . Anemia   . Melena   . IBS (irritable bowel syndrome)   . Incontinence   . GERD (gastroesophageal reflux disease)   . Osteopenia   . Thyroid disease     hypo  . Personal history of colonic polyps   . Current use of long term anticoagulation   . Cough   . Diarrhea   . Pneumonia     organism unspecified  . Constipation   . UTI (lower urinary tract infection)   . Unspecified adverse effect of other drug, medicinal and biological substance(995.29)   . History of mammogram 2009  . Diverticulosis   . GERD (gastroesophageal reflux disease)   . Hyperlipidemia   . Atrial fibrillation   . Arthritis     hips, knees  . Headache(784.0) 06/22/2012  . Renal insufficiency 06/28/2012  . Hyperkalemia 06/28/2012   Past Surgical History  Procedure Laterality Date  . Breast biopsy Left   . Cardiac electrophysiology mapping and ablation    . Abdominal  hysterectomy      partial, ovaries left in place  . Cataract extraction, bilateral    . Esophagoscopy w/ botox injection    . Esophagogastroduodenoscopy N/A 09/03/2012    Procedure: ESOPHAGOGASTRODUODENOSCOPY (EGD);  Surgeon: Louis Meckel, MD;  Location: [facility];  Service: Endoscopy;  Laterality: N/A;  . Botox injection N/A 09/03/2012    Procedure: BOTOX INJECTION;  Surgeon: Louis Meckel, MD;  Location: [facility];  Service: Endoscopy;  Laterality: N/A;   Family History  Problem Relation Age of Onset  . Diabetes Mother   . CVA Mother   . COPD Father   . Heart disease Maternal Aunt   . Heart disease Maternal Uncle   . Allergies Daughter   . COPD Daughter   . Stroke Daughter   . COPD Daughter     previous smoker  . Stroke Maternal Grandfather    History  Substance Use Topics  . Smoking status: Never Smoker   . Smokeless tobacco: Never Used  . Alcohol Use: No   OB History   Grav Para Term Preterm Abortions TAB SAB Ect Mult Living                 Review of Systems  Constitutional: Negative for fever.  HENT: Negative for congestion.   Respiratory: Negative for cough and shortness of breath.   Cardiovascular: Negative for  chest pain.  Gastrointestinal: Negative for nausea, vomiting, abdominal pain and diarrhea.  All other systems reviewed and are negative.    Allergies  Darifenacin hydrobromide; Sulfonamide derivatives; and Tramadol  Home Medications   Current Outpatient Rx  Name  Route  Sig  Dispense  Refill  . ALPRAZolam (XANAX) 0.25 MG tablet   Oral   Take 1 tablet (0.25 mg total) by mouth 3 (three) times daily. For stress   90 tablet   5   . atorvastatin (LIPITOR) 10 MG tablet   Oral   Take 1 tablet (10 mg total) by mouth daily.   30 tablet   5   . calcium acetate (PHOSLO) 667 MG tablet   Oral   Take 1 tablet by mouth 2 (two) times daily.   60 tablet   2   . Cholecalciferol (VITAMIN D) 2000 UNITS CAPS   Oral   Take 2,000 Units by mouth  every other day.         . metoprolol tartrate (LOPRESSOR) 25 MG tablet   Oral   Take 0.5 tablets (12.5 mg total) by mouth 2 (two) times daily. bid   90 tablet   3   . omeprazole-sodium bicarbonate (ZEGERID) 40-1100 MG per capsule      Take one tab at bedtime   90 capsule   3   . ramipril (ALTACE) 10 MG capsule   Oral   Take 1 capsule (10 mg total) by mouth daily.   90 capsule   3   . vitamin B-12 (CYANOCOBALAMIN) 1000 MCG tablet   Oral   Take 1 tablet (1,000 mcg total) by mouth daily.         Marland Kitchen warfarin (COUMADIN) 2.5 MG tablet   Oral   Take 2.5 mg by mouth as directed. Mon, wed, Friday 1.5 tab          BP 173/69  Pulse 70  Temp(Src) 98 F (36.7 C) (Oral)  Resp 18  Ht 4\' 11"  (1.499 m)  Wt 121 lb (54.885 kg)  BMI 24.43 kg/m2  SpO2 98% Physical Exam  Nursing note and vitals reviewed. Constitutional: She is oriented to person, place, and time. She appears well-developed and well-nourished. No distress.  HENT:  Head: Normocephalic and atraumatic.  Mouth/Throat: Oropharynx is clear and moist.  Eyes: Conjunctivae are normal. Pupils are equal, round, and reactive to light. No scleral icterus.  Neck: Neck supple.  Cardiovascular: Normal rate, regular rhythm, normal heart sounds and intact distal pulses.   No murmur heard. Pulmonary/Chest: Effort normal and breath sounds normal. No stridor. No respiratory distress. She has no rales.  Abdominal: Soft. Bowel sounds are normal. She exhibits no distension. There is no tenderness.  Musculoskeletal: Normal range of motion.       Hands: Neurological: She is alert and oriented to person, place, and time.  Skin: Skin is warm and dry. No rash noted.  Psychiatric: She has a normal mood and affect. Her behavior is normal.    ED Course  Procedures (including critical care time) Labs Review Labs Reviewed  BASIC METABOLIC PANEL - Abnormal; Notable for the following:    GFR calc non Af Amer 44 (*)    GFR calc Af Amer 51  (*)    All other components within normal limits  PROTIME-INR - Abnormal; Notable for the following:    Prothrombin Time 25.3 (*)    INR 2.39 (*)    All other components within normal limits  CBC WITH DIFFERENTIAL  Imaging Review Dg Hand Complete Right  11/06/2012   CLINICAL DATA:  Right hand pain and swelling, no known injury  EXAM: RIGHT HAND - COMPLETE 3+ VIEW  COMPARISON:  None.  FINDINGS: Three views of the right hand submitted. No acute fracture or subluxation. Degenerative changes noted proximal interphalangeal joint 1st 3rd and 4th finger. Soft tissue swelling noted 4th finger. There is soft tissue swelling dorsal metacarpophalangeal joints region. There are degenerative changes distal interphalangeal joints 2nd 3rd 4th and 5th finger. Mild narrowing of radiocarpal joint space.  IMPRESSION: No acute fracture or subluxation. Degenerative changes as described above. Soft tissue swelling.   Electronically Signed   By: Natasha Mead M.D.   On: 11/06/2012 11:46  All radiology studies independently viewed by me.     EKG Interpretation   None       MDM   1. Finger swelling    79 yo female with pain in right 4th digit for two days, now with redness and swelling.  Finger is slightly warm.  Pain and decreased ROM of PIP joint.  Redness could represent bruising worsened by coumadin or possibly infection.  Discussed with Dr. Merlyn Lot who will evaluate her at [facility].  She will be transferred by private vehicle with her daughter.      Candyce Churn, MD 11/06/12 [phone]

## 2012-11-06 NOTE — ED Notes (Signed)
Pt d/c'd from ED stable and in NAD.

## 2012-11-07 ENCOUNTER — Ambulatory Visit (INDEPENDENT_AMBULATORY_CARE_PROVIDER_SITE_OTHER): Payer: Medicare Other | Admitting: Cardiology

## 2012-11-07 ENCOUNTER — Encounter (HOSPITAL_COMMUNITY): Payer: Self-pay | Admitting: Orthopedic Surgery

## 2012-11-07 DIAGNOSIS — I4891 Unspecified atrial fibrillation: Secondary | ICD-10-CM

## 2012-11-07 DIAGNOSIS — Z7901 Long term (current) use of anticoagulants: Secondary | ICD-10-CM

## 2012-11-08 NOTE — Op Note (Signed)
NAMEANALAYA, HOEY                ACCOUNT NO.:  [id number]  MEDICAL RECORD NO.:  [id number]  LOCATION:  [facility]                         FACILITY:  [facility]  PHYSICIAN:  Betha Loa, MD        DATE OF BIRTH:  11-May-1924  DATE OF PROCEDURE:  11/06/2012 DATE OF DISCHARGE:  11/06/2012                              OPERATIVE REPORT   PREOPERATIVE DIAGNOSIS:  Right ring finger proximal interphalangeal joint infection versus inflammation.  POSTOPERATIVE DIAGNOSIS:  Right ring finger proximal interphalangeal joint infection versus inflammation.  PROCEDURE:  Right ring finger proximal interphalangeal joint, irrigation and debridement.  SURGEON:  Betha Loa, MD  ASSISTANT:  None.  ANESTHESIA:  General.  IV FLUIDS:  Per anesthesia flow sheet.  ESTIMATED BLOOD LOSS:  Minimal.  COMPLICATIONS:  None.  SPECIMENS:  Aerobic and anaerobic cultures to Micro and gouty tophus to Pathology.  TOURNIQUET TIME:  33 minutes.  DISPOSITION:  Stable to PACU.  INDICATIONS:  Ms. Hamme is an 79 year old right-hand dominant female who was noted pain and swelling in the right ring finger over the past 2 days.  This has gotten worse in that time.  She has had no fevers, chills, or night sweats.  She presented to [facility], where she was evaluated and transferred to [facility] for my evaluation of care.  We discussed Ms. Molla nature of the condition.  We discussed treating the finger with oral antibiotics, anti-inflammatories and close followup versus irrigation and debridement.  She wished to proceed with irrigation and debridement.  Risks, benefits and alternatives of the surgery were discussed including the risk of blood loss, infection, damage to nerves, vessels, tendons, ligaments, bone; failure of surgery; need for additional surgery, complications with wound healing, continued pain, continued infection, need for repeat irrigation and debridement. She voiced understanding of these  risks and elected to proceed.  OPERATIVE COURSE:  After being identified preoperatively by myself, the patient and I agreed upon procedure and site of procedure.  Surgical site was marked.  The risks, benefits, and alternatives of surgery were reviewed and she wished to proceed.  Surgical consent had been signed. She was transferred to the operating room and placed on the operating table in supine position with the right upper extremity on arm board. General anesthesia was induced by anesthesiologist.  Right upper extremity was prepped and draped in normal sterile orthopedic fashion. A surgical pause was performed between surgeons, anesthesia, operating staff, and all were in agreement as to the patient, procedure, and site of procedure.  Tourniquet at the proximal aspect of the extremity was inflated to 250 mmHg after gravity exsanguination of the fingers and Esmarch exsanguination of hand and forearm.  Incision was made at the radial side of the PIP joint of the index finger and carried into subcutaneous tissues by spreading technique.  The extensor tendon was identified. The lateral bands were retracted dorsally.  The PIP joint was entered underneath the tendon.  There was what appeared to be a gouty tophus at the radial side of the PIP joint.  Cultures were taken and sent to Micro for examination.  Portions of the tophus-appearing tissue were taken and sent for  examination.  The PIP joint was copiously irrigated with 200 mL of sterile saline by Angiocath needle.  The wound was irrigated with sterile saline.  Two vessel loop drains were placed in the PIP joint to allow continued drainage as necessary.  The proximal and distal portion of the wound was closed with 5-0 nylon suture in a horizontal mattress fashion.  The central portion was left open to allow drainage as necessary.  I feel this was most likely a gouty inflammation rather than infection, felt it was okay to close the  proximal and distal aspects of the wound.  Digital block was performed with 9 mL of 0.25% plain Marcaine to aid in postoperative analgesia.  The wound was dressed with sterile Xeroform, 4 x 4 and wrapped with a Kling and Coban dressing lightly.  Tourniquet was deflated at 33 minutes.  The operative drapes were broken down.  The patient was awoken from anesthesia safely.  She was transferred back to stretcher and taken to PACU in stable condition. I will see her back in the office.  The beginning next week for followup.  I will give her Norco 5/325, 1-2 p.o. q.6 hours p.r.n. pain, dispensed #30, doxycycline 100 mg p.o. b.i.d. x7 days as coverage for a possible infection.   Betha Loa, MD     KK/MEDQ  D:  11/06/2012  T:  11/08/2012  Job:  [id number]

## 2012-11-09 LAB — CULTURE, ROUTINE-ABSCESS
Culture: NO GROWTH
Gram Stain: NONE SEEN

## 2012-11-11 LAB — ANAEROBIC CULTURE: Gram Stain: NONE SEEN

## 2012-12-05 ENCOUNTER — Ambulatory Visit (INDEPENDENT_AMBULATORY_CARE_PROVIDER_SITE_OTHER): Payer: Medicare Other | Admitting: Pharmacist

## 2012-12-05 ENCOUNTER — Other Ambulatory Visit: Payer: Self-pay | Admitting: Family Medicine

## 2012-12-05 DIAGNOSIS — Z7901 Long term (current) use of anticoagulants: Secondary | ICD-10-CM

## 2012-12-05 DIAGNOSIS — I4891 Unspecified atrial fibrillation: Secondary | ICD-10-CM

## 2012-12-05 LAB — POCT INR: INR: 2.8

## 2013-01-02 ENCOUNTER — Ambulatory Visit (INDEPENDENT_AMBULATORY_CARE_PROVIDER_SITE_OTHER): Payer: Medicare Other | Admitting: Pharmacist

## 2013-01-02 ENCOUNTER — Other Ambulatory Visit: Payer: Self-pay | Admitting: Cardiology

## 2013-01-02 DIAGNOSIS — Z7901 Long term (current) use of anticoagulants: Secondary | ICD-10-CM

## 2013-01-02 DIAGNOSIS — I4891 Unspecified atrial fibrillation: Secondary | ICD-10-CM

## 2013-01-02 LAB — POCT INR: INR: 4.5

## 2013-01-02 MED ORDER — WARFARIN SODIUM 2.5 MG PO TABS: ORAL_TABLET | ORAL | Status: DC

## 2013-01-16 ENCOUNTER — Ambulatory Visit (INDEPENDENT_AMBULATORY_CARE_PROVIDER_SITE_OTHER): Payer: Medicare Other | Admitting: Pharmacist

## 2013-01-16 ENCOUNTER — Other Ambulatory Visit: Payer: Self-pay | Admitting: Family Medicine

## 2013-01-16 DIAGNOSIS — Z7901 Long term (current) use of anticoagulants: Secondary | ICD-10-CM

## 2013-01-16 DIAGNOSIS — I4891 Unspecified atrial fibrillation: Secondary | ICD-10-CM

## 2013-01-16 LAB — POCT INR: INR: 2.8

## 2013-01-16 NOTE — Telephone Encounter (Signed)
OK to send 90 tabs with zero refills. 

## 2013-01-16 NOTE — Telephone Encounter (Signed)
Rx called to pharmacy voicemail. 

## 2013-01-16 NOTE — Telephone Encounter (Signed)
Please advise Alprazolam refill?  Last RX was done on 06-20-12 quantity 90 with 5 refills  If ok fax to [phone]

## 2013-01-29 ENCOUNTER — Ambulatory Visit (INDEPENDENT_AMBULATORY_CARE_PROVIDER_SITE_OTHER): Payer: Medicare Other | Admitting: Physician Assistant

## 2013-01-29 ENCOUNTER — Encounter: Payer: Self-pay | Admitting: Physician Assistant

## 2013-01-29 VITALS — BP 158/78 | HR 67 | Temp 97.6°F | Resp 14 | Ht 60.0 in | Wt 118.0 lb

## 2013-01-29 DIAGNOSIS — J309 Allergic rhinitis, unspecified: Secondary | ICD-10-CM

## 2013-01-29 DIAGNOSIS — R42 Dizziness and giddiness: Secondary | ICD-10-CM

## 2013-01-29 MED ORDER — FLUTICASONE PROPIONATE 50 MCG/ACT NA SUSP: 2.0000 | Freq: Every day | NASAL | Status: DC

## 2013-01-29 NOTE — Progress Notes (Signed)
Pre visit review using our clinic review tool, if applicable. No additional management support is needed unless otherwise documented below in the visit note/SLS  

## 2013-01-29 NOTE — Patient Instructions (Signed)
Please take your medications as prescribed.  Use nasal spray to help keep fluid from building behind your ears.  Do not skip meals.  Stay well-hydrated.  Follow-up with Dr. Charlett Blake tomorrow at scheduled appointment.  Hypertension As your heart beats, it forces blood through your arteries. This force is your blood pressure. If the pressure is too high, it is called hypertension (HTN) or high blood pressure. HTN is dangerous because you may have it and not know it. High blood pressure may mean that your heart has to work harder to pump blood. Your arteries may be narrow or stiff. The extra work puts you at risk for heart disease, stroke, and other problems.  Blood pressure consists of two numbers, a higher number over a lower, 110/72, for example. It is stated as "110 over 72." The ideal is below 120 for the top number (systolic) and under 80 for the bottom (diastolic). Write down your blood pressure today. You should pay close attention to your blood pressure if you have certain conditions such as:  Heart failure.  Prior heart attack.  Diabetes  Chronic kidney disease.  Prior stroke.  Multiple risk factors for heart disease. To see if you have HTN, your blood pressure should be measured while you are seated with your arm held at the level of the heart. It should be measured at least twice. A one-time elevated blood pressure reading (especially in the Emergency Department) does not mean that you need treatment. There may be conditions in which the blood pressure is different between your right and left arms. It is important to see your caregiver soon for a recheck. Most people have essential hypertension which means that there is not a specific cause. This type of high blood pressure may be lowered by changing lifestyle factors such as:  Stress.  Smoking.  Lack of exercise.  Excessive weight.  Drug/tobacco/alcohol use.  Eating less salt. Most people do not have symptoms from high blood  pressure until it has caused damage to the body. Effective treatment can often prevent, delay or reduce that damage. TREATMENT  When a cause has been identified, treatment for high blood pressure is directed at the cause. There are a large number of medications to treat HTN. These fall into several categories, and your caregiver will help you select the medicines that are best for you. Medications may have side effects. You should review side effects with your caregiver. If your blood pressure stays high after you have made lifestyle changes or started on medicines,   Your medication(s) may need to be changed.  Other problems may need to be addressed.  Be certain you understand your prescriptions, and know how and when to take your medicine.  Be sure to follow up with your caregiver within the time frame advised (usually within two weeks) to have your blood pressure rechecked and to review your medications.  If you are taking more than one medicine to lower your blood pressure, make sure you know how and at what times they should be taken. Taking two medicines at the same time can result in blood pressure that is too low. SEEK IMMEDIATE MEDICAL CARE IF:  You develop a severe headache, blurred or changing vision, or confusion.  You have unusual weakness or numbness, or a faint feeling.  You have severe chest or abdominal pain, vomiting, or breathing problems. MAKE SURE YOU:   Understand these instructions.  Will watch your condition.  Will get help right away if you are  not doing well or get worse. Document Released: 12/18/2004 Document Revised: 03/12/2011 Document Reviewed: 08/08/2007 [facility] Patient Information 2014 [place].

## 2013-01-29 NOTE — Progress Notes (Signed)
Patient presents to clinic today c/o one episode of dizziness this am.  Patient states she woke up from a dream and turned over in bed and felt the room spinning.  Patient states symptoms were present for < 1 minute.  Denies headache, vision changes, lightheadedness or syncope.  Denies history of vertigo.  Denies URI symptoms.  Symptoms have not returned. Patient has been ambulating well today.  Denies focal weakness, facial drooping, difficulty with swallowing or speech.  Feels fine at present, but "thought she should come in". Patient has appointment for annual exam with her PCP tomorrow morning.  Past Medical History  Diagnosis Date  . Paroxysmal a-fib   . Mitral and aortic regurgitation   . Mitral valve prolapse     hx of  . Hypertension   . Hyperlipidemia   . Vitamin D deficiency   . Pneumonia   . Tracheitis   . Gait difficulty   . Anemia   . Melena   . IBS (irritable bowel syndrome)   . Incontinence   . GERD (gastroesophageal reflux disease)   . Osteopenia   . Thyroid disease     hypo  . Personal history of colonic polyps   . Current use of long term anticoagulation   . Cough   . Diarrhea   . Pneumonia     organism unspecified  . Constipation   . UTI (lower urinary tract infection)   . Unspecified adverse effect of other drug, medicinal and biological substance(995.29)   . History of mammogram 2009  . Diverticulosis   . GERD (gastroesophageal reflux disease)   . Hyperlipidemia   . Atrial fibrillation   . Arthritis     hips, knees  . Headache(784.0) 06/22/2012  . Renal insufficiency 06/28/2012  . Hyperkalemia 06/28/2012    Current Outpatient Prescriptions on File Prior to Visit  Medication Sig Dispense Refill  . ALPRAZolam (XANAX) 0.25 MG tablet TAKE 1 TABLET BY MOUTH 3 TIMES A DAY AS NEEDED FOR STRESS  90 tablet  3  . atorvastatin (LIPITOR) 10 MG tablet TAKE 1 TABLET BY MOUTH EVERY DAY  30 tablet  1  . calcium acetate (PHOSLO) 667 MG tablet Take 1 tablet by mouth 2  (two) times daily.  60 tablet  2  . Cholecalciferol (VITAMIN D) 2000 UNITS CAPS Take 2,000 Units by mouth every other day.      . esomeprazole (NEXIUM) 40 MG capsule Take 40 mg by mouth 2 (two) times daily before a meal.      . metoprolol tartrate (LOPRESSOR) 25 MG tablet Take 0.5 tablets (12.5 mg total) by mouth 2 (two) times daily. bid  90 tablet  3  . ramipril (ALTACE) 10 MG capsule Take 1 capsule (10 mg total) by mouth daily.  90 capsule  3  . vitamin B-12 (CYANOCOBALAMIN) 1000 MCG tablet Take 1 tablet (1,000 mcg total) by mouth daily.      Marland Kitchen warfarin (COUMADIN) 2.5 MG tablet Take as directed by Anticoagulation clinic  40 tablet  3   No current facility-administered medications on file prior to visit.    Allergies  Allergen Reactions  . Darifenacin Hydrobromide     REACTION: causes her dizziness  . Sulfonamide Derivatives   . Tramadol Other (See Comments)    Insomnia, anorexia    Family History  Problem Relation Age of Onset  . Diabetes Mother   . CVA Mother   . COPD Father   . Heart disease Maternal Aunt   . Heart disease  Maternal Uncle   . Allergies Daughter   . COPD Daughter   . Stroke Daughter   . COPD Daughter     previous smoker  . Stroke Maternal Grandfather     History   Social History  . Marital Status: Widowed    Spouse Name: N/A    Number of Children: 3  . Years of Education: N/A   Occupational History  .     Social History Main Topics  . Smoking status: Never Smoker   . Smokeless tobacco: Never Used  . Alcohol Use: No  . Drug Use: No  . Sexual Activity: No   Other Topics Concern  . None   Social History Narrative  . None    Review of Systems - See HPI.  All other ROS are negative.  Filed Vitals:   01/29/13 1128  BP: 158/78  Pulse: 67  Temp: 97.6 F (36.4 C)  Resp: 14   Physical Exam  Vitals reviewed. Constitutional: She is oriented to person, place, and time and well-developed, well-nourished, and in no distress.  HENT:  Head:  Normocephalic and atraumatic.  Right Ear: External ear normal.  Left Ear: External ear normal.  Nose: Nose normal.  Mouth/Throat: Oropharynx is clear and moist. No oropharyngeal exudate.  TM within normal w/o erythema, dullness, bulging or retraction.  There is a very small amount of fluid behind the left ear.  Eyes: Conjunctivae and EOM are normal. Pupils are equal, round, and reactive to light.  Neck: Neck supple.  Cardiovascular: Normal rate, regular rhythm, normal heart sounds and intact distal pulses.   Pulmonary/Chest: Effort normal. No respiratory distress. She has no wheezes. She has no rales. She exhibits no tenderness.  Lymphadenopathy:    She has no cervical adenopathy.  Neurological: She is alert and oriented to person, place, and time. She has normal reflexes. No cranial nerve deficit. Gait normal. Coordination normal.  Skin: Skin is warm and dry. No rash noted.  Psychiatric: Affect normal.    Recent Results (from the past 2160 hour(s))  CBC WITH DIFFERENTIAL     Status: None   Collection Time    11/06/12 12:05 PM      Result Value Range   WBC 6.3  4.0 - 10.5 K/uL   RBC 3.95  3.87 - 5.11 MIL/uL   Hemoglobin 12.1  12.0 - 15.0 g/dL   HCT 36.5  36.0 - 46.0 %   MCV 92.4  78.0 - 100.0 fL   MCH 30.6  26.0 - 34.0 pg   MCHC 33.2  30.0 - 36.0 g/dL   RDW 13.5  11.5 - 15.5 %   Platelets 169  150 - 400 K/uL   Neutrophils Relative % 68  43 - 77 %   Neutro Abs 4.2  1.7 - 7.7 K/uL   Lymphocytes Relative 25  12 - 46 %   Lymphs Abs 1.6  0.7 - 4.0 K/uL   Monocytes Relative 6  3 - 12 %   Monocytes Absolute 0.4  0.1 - 1.0 K/uL   Eosinophils Relative 1  0 - 5 %   Eosinophils Absolute 0.1  0.0 - 0.7 K/uL   Basophils Relative 1  0 - 1 %   Basophils Absolute 0.0  0.0 - 0.1 K/uL  BASIC METABOLIC PANEL     Status: Abnormal   Collection Time    11/06/12 12:05 PM      Result Value Range   Sodium 141  135 - 145 mEq/L  Potassium 3.7  3.5 - 5.1 mEq/L   Chloride 102  96 - 112 mEq/L    CO2 31  19 - 32 mEq/L   Glucose, Bld 99  70 - 99 mg/dL   BUN 17  6 - 23 mg/dL   Creatinine, Ser 1.10  0.50 - 1.10 mg/dL   Calcium 9.5  8.4 - 10.5 mg/dL   GFR calc non Af Amer 44 (*) >90 mL/min   GFR calc Af Amer 51 (*) >90 mL/min   Comment: (NOTE)     The eGFR has been calculated using the CKD EPI equation.     This calculation has not been validated in all clinical situations.     eGFR's persistently <90 mL/min signify possible Chronic Kidney     Disease.  PROTIME-INR     Status: Abnormal   Collection Time    11/06/12 12:05 PM      Result Value Range   Prothrombin Time 25.3 (*) 11.6 - 15.2 seconds   INR 2.39 (*) 0.00 - 1.49  CULTURE, ROUTINE-ABSCESS     Status: None   Collection Time    11/06/12 10:36 PM      Result Value Range   Specimen Description ABSCESS FINGER RIGHT     Special Requests NONE     Gram Stain       Value: NO WBC SEEN     NO SQUAMOUS EPITHELIAL CELLS SEEN     NO ORGANISMS SEEN     Performed at [facility]   Culture       Value: NO GROWTH 2 DAYS     Performed at [facility]   Report Status 11/09/2012 FINAL    ANAEROBIC CULTURE     Status: None   Collection Time    11/06/12 10:36 PM      Result Value Range   Specimen Description ABSCESS FINGER RIGHT     Special Requests NONE     Gram Stain       Value: NO WBC SEEN     NO SQUAMOUS EPITHELIAL CELLS SEEN     NO ORGANISMS SEEN     Performed at [facility]   Culture       Value: NO ANAEROBES ISOLATED     Performed at [facility]   Report Status 11/11/2012 FINAL    POCT INR     Status: None   Collection Time    12/05/12 11:48 AM      Result Value Range   INR 2.8    POCT INR     Status: None   Collection Time    01/02/13 11:45 AM      Result Value Range   INR 4.5    POCT INR     Status: None   Collection Time    01/16/13 11:49 AM      Result Value Range   INR 2.8      Assessment/Plan: No problem-specific assessment & plan notes found for this  encounter.

## 2013-01-30 ENCOUNTER — Encounter: Payer: Self-pay | Admitting: Family Medicine

## 2013-01-30 ENCOUNTER — Ambulatory Visit (INDEPENDENT_AMBULATORY_CARE_PROVIDER_SITE_OTHER): Payer: Medicare Other | Admitting: Family Medicine

## 2013-01-30 ENCOUNTER — Telehealth: Payer: Self-pay | Admitting: Family Medicine

## 2013-01-30 VITALS — BP 144/68 | HR 71 | Temp 98.1°F | Ht 60.0 in | Wt 117.1 lb

## 2013-01-30 DIAGNOSIS — J309 Allergic rhinitis, unspecified: Secondary | ICD-10-CM

## 2013-01-30 DIAGNOSIS — E785 Hyperlipidemia, unspecified: Secondary | ICD-10-CM

## 2013-01-30 DIAGNOSIS — Z7901 Long term (current) use of anticoagulants: Secondary | ICD-10-CM

## 2013-01-30 DIAGNOSIS — E039 Hypothyroidism, unspecified: Secondary | ICD-10-CM

## 2013-01-30 DIAGNOSIS — I10 Essential (primary) hypertension: Secondary | ICD-10-CM

## 2013-01-30 DIAGNOSIS — R351 Nocturia: Secondary | ICD-10-CM

## 2013-01-30 DIAGNOSIS — N289 Disorder of kidney and ureter, unspecified: Secondary | ICD-10-CM

## 2013-01-30 MED ORDER — ATORVASTATIN CALCIUM 10 MG PO TABS: 10.0000 mg | ORAL_TABLET | Freq: Every day | ORAL | Status: DC

## 2013-01-30 MED ORDER — RAMIPRIL 10 MG PO CAPS: 10.0000 mg | ORAL_CAPSULE | Freq: Every day | ORAL | Status: DC

## 2013-01-30 MED ORDER — CALCIUM ACETATE (PHOS BINDER) 667 MG PO TABS: 667.0000 mg | ORAL_TABLET | Freq: Two times a day (BID) | ORAL | Status: DC

## 2013-01-30 MED ORDER — METOPROLOL TARTRATE 25 MG PO TABS: 12.5000 mg | ORAL_TABLET | Freq: Two times a day (BID) | ORAL | Status: DC

## 2013-01-30 NOTE — Telephone Encounter (Signed)
Patient states that she needs refills of all medications sent to [facility] in [facility]

## 2013-01-30 NOTE — Progress Notes (Signed)
Pre visit review using our clinic review tool, if applicable. No additional management support is needed unless otherwise documented below in the visit note. 

## 2013-01-30 NOTE — Patient Instructions (Signed)
Start the nose spray with one spray daily if no trouble go to 2 sprays each nostril Small Gatorade daily Cranberry juice OK  call if symptoms worsen   Vertigo Vertigo means you feel like you or your surroundings are moving when they are not. Vertigo can be dangerous if it occurs when you are at work, driving, or performing difficult activities.  CAUSES  Vertigo occurs when there is a conflict of signals sent to your brain from the visual and sensory systems in your body. There are many different causes of vertigo, including:  Infections, especially in the inner ear.  A bad reaction to a drug or misuse of alcohol and medicines.  Withdrawal from drugs or alcohol.  Rapidly changing positions, such as lying down or rolling over in bed.  A migraine headache.  Decreased blood flow to the brain.  Increased pressure in the brain from a head injury, infection, tumor, or bleeding. SYMPTOMS  You may feel as though the world is spinning around or you are falling to the ground. Because your balance is upset, vertigo can cause nausea and vomiting. You may have involuntary eye movements (nystagmus). DIAGNOSIS  Vertigo is usually diagnosed by physical exam. If the cause of your vertigo is unknown, your caregiver may perform imaging tests, such as an MRI scan (magnetic resonance imaging). TREATMENT  Most cases of vertigo resolve on their own, without treatment. Depending on the cause, your caregiver may prescribe certain medicines. If your vertigo is related to body position issues, your caregiver may recommend movements or procedures to correct the problem. In rare cases, if your vertigo is caused by certain inner ear problems, you may need surgery. HOME CARE INSTRUCTIONS   Follow your caregiver's instructions.  Avoid driving.  Avoid operating heavy machinery.  Avoid performing any tasks that would be dangerous to you or others during a vertigo episode.  Tell your caregiver if you notice that  certain medicines seem to be causing your vertigo. Some of the medicines used to treat vertigo episodes can actually make them worse in some people. SEEK IMMEDIATE MEDICAL CARE IF:   Your medicines do not relieve your vertigo or are making it worse.  You develop problems with talking, walking, weakness, or using your arms, hands, or legs.  You develop severe headaches.  Your nausea or vomiting continues or gets worse.  You develop visual changes.  A family member notices behavioral changes.  Your condition gets worse. MAKE SURE YOU:  Understand these instructions.  Will watch your condition.  Will get help right away if you are not doing well or get worse. Document Released: 09/27/2004 Document Revised: 03/12/2011 Document Reviewed: 07/06/2010 [facility] Patient Information 2014 [employer].

## 2013-01-30 NOTE — Assessment & Plan Note (Signed)
One episode, lasting < 1 minute.  No recurrence.  Monitor symptoms.  Rx Flonase to help remove fluid behind ear.  Please call or return to clinic if symptoms recur.  Follow-up with PCP tomorrow for scheduled examination.

## 2013-01-31 LAB — URINALYSIS
Bilirubin Urine: NEGATIVE
Glucose, UA: NEGATIVE mg/dL
Hgb urine dipstick: NEGATIVE
Ketones, ur: NEGATIVE mg/dL
NITRITE: NEGATIVE
PH: 6 (ref 5.0–8.0)
Protein, ur: NEGATIVE mg/dL
SPECIFIC GRAVITY, URINE: 1.014 (ref 1.005–1.030)
Urobilinogen, UA: 0.2 mg/dL (ref 0.0–1.0)

## 2013-02-01 ENCOUNTER — Encounter: Payer: Self-pay | Admitting: Family Medicine

## 2013-02-01 LAB — URINE CULTURE
Colony Count: NO GROWTH
ORGANISM ID, BACTERIA: NO GROWTH

## 2013-02-01 NOTE — Assessment & Plan Note (Signed)
Adequate control, no changes 

## 2013-02-01 NOTE — Assessment & Plan Note (Signed)
Tolerating Atorvastatin. Avoid trans fats.

## 2013-02-01 NOTE — Progress Notes (Signed)
Patient ID: [name] [name], female   DOB: [birth year], 80 y.o.   MRN: [id number] KIMIYE STRATHMAN [id number] April 21, 1924 02/01/2013      Progress Note-Follow Up  Subjective  Chief Complaint  Chief Complaint  Patient presents with  . Follow-up    4 month    HPI  Patient is an 80 year old female who is in today for followup. She feels well. She denies any recent illness. No chest pain, palpitations, shortness of breath, GI or GU concerns at this time. Taking medications as prescribed. No recent hospitalization or emergency room visits  Past Medical History  Diagnosis Date  . Paroxysmal a-fib   . Mitral and aortic regurgitation   . Mitral valve prolapse     hx of  . Hypertension   . Hyperlipidemia   . Vitamin D deficiency   . Pneumonia   . Tracheitis   . Gait difficulty   . Anemia   . Melena   . IBS (irritable bowel syndrome)   . Incontinence   . GERD (gastroesophageal reflux disease)   . Osteopenia   . Thyroid disease     hypo  . Personal history of colonic polyps   . Current use of long term anticoagulation   . Cough   . Diarrhea   . Pneumonia     organism unspecified  . Constipation   . UTI (lower urinary tract infection)   . Unspecified adverse effect of other drug, medicinal and biological substance(995.29)   . History of mammogram 2009  . Diverticulosis   . GERD (gastroesophageal reflux disease)   . Hyperlipidemia   . Atrial fibrillation   . Arthritis     hips, knees  . Headache(784.0) 06/22/2012  . Renal insufficiency 06/28/2012  . Hyperkalemia 06/28/2012    Past Surgical History  Procedure Laterality Date  . Breast biopsy Left   . Cardiac electrophysiology mapping and ablation    . Abdominal hysterectomy      partial, ovaries left in place  . Cataract extraction, bilateral    . Esophagoscopy w/ botox injection    . Esophagogastroduodenoscopy N/A 09/03/2012    Procedure: ESOPHAGOGASTRODUODENOSCOPY (EGD);  Surgeon: Inda Castle, MD;  Location: [facility];  Service: Endoscopy;  Laterality: N/A;  . Botox injection N/A 09/03/2012    Procedure: BOTOX INJECTION;  Surgeon: Inda Castle, MD;  Location: [facility];  Service: Endoscopy;  Laterality: N/A;  . I&d extremity Right 11/06/2012    Procedure: IRRIGATION AND DEBRIDEMENT EXTREMITY Right Ring Finger;  Surgeon: Tennis Must, MD;  Location: [place];  Service: Orthopedics;  Laterality: Right;    Family History  Problem Relation Age of Onset  . Diabetes Mother   . CVA Mother   . COPD Father   . Heart disease Maternal Aunt   . Heart disease Maternal Uncle   . Allergies Daughter   . COPD Daughter   . Stroke Daughter   . COPD Daughter     previous smoker  . Stroke Maternal Grandfather     History   Social History  . Marital Status: Widowed    Spouse Name: N/A    Number of Children: 3  . Years of Education: N/A   Occupational History  .     Social History Main Topics  . Smoking status: Never Smoker   . Smokeless tobacco: Never Used  . Alcohol Use: No  . Drug Use: No  . Sexual Activity: No   Other Topics Concern  . Not on file  Social History Narrative  . No narrative on file    Current Outpatient Prescriptions on File Prior to Visit  Medication Sig Dispense Refill  . ALPRAZolam (XANAX) 0.25 MG tablet TAKE 1 TABLET BY MOUTH 3 TIMES A DAY AS NEEDED FOR STRESS  90 tablet  3  . Cholecalciferol (VITAMIN D) 2000 UNITS CAPS Take 2,000 Units by mouth every other day.      . esomeprazole (NEXIUM) 40 MG capsule Take 40 mg by mouth 2 (two) times daily before a meal.      . vitamin B-12 (CYANOCOBALAMIN) 1000 MCG tablet Take 1 tablet (1,000 mcg total) by mouth daily.      Marland Kitchen warfarin (COUMADIN) 2.5 MG tablet Take as directed by Anticoagulation clinic  40 tablet  3  . fluticasone (FLONASE) 50 MCG/ACT nasal spray Place 2 sprays into both nostrils daily.  16 g  6   No current facility-administered medications on file prior to visit.    Allergies  Allergen Reactions  .  Darifenacin Hydrobromide     REACTION: causes her dizziness  . Sulfonamide Derivatives   . Tramadol Other (See Comments)    Insomnia, anorexia    Review of Systems  Review of Systems  Constitutional: Negative for fever and malaise/fatigue.  HENT: Negative for congestion.   Eyes: Negative for discharge.  Respiratory: Negative for shortness of breath.   Cardiovascular: Negative for chest pain, palpitations and leg swelling.  Gastrointestinal: Negative for nausea, abdominal pain and diarrhea.  Genitourinary: Negative for dysuria.  Musculoskeletal: Negative for falls.  Skin: Negative for rash.  Neurological: Negative for loss of consciousness and headaches.  Endo/Heme/Allergies: Negative for polydipsia.  Psychiatric/Behavioral: Negative for depression and suicidal ideas. The patient is not nervous/anxious and does not have insomnia.     Objective  BP 144/68  Pulse 71  Temp(Src) 98.1 F (36.7 C) (Oral)  Ht 5' (1.524 m)  Wt 117 lb 1.3 oz (53.107 kg)  BMI 22.87 kg/m2  SpO2 96%  Physical Exam  Physical Exam  Constitutional: She is oriented to person, place, and time and well-developed, well-nourished, and in no distress. No distress.  HENT:  Head: Normocephalic and atraumatic.  Eyes: Conjunctivae are normal.  Neck: Neck supple. No thyromegaly present.  Cardiovascular: Normal rate, regular rhythm and normal heart sounds.   No murmur heard. Pulmonary/Chest: Effort normal and breath sounds normal. She has no wheezes.  Abdominal: She exhibits no distension and no mass.  Musculoskeletal: She exhibits no edema.  Lymphadenopathy:    She has no cervical adenopathy.  Neurological: She is alert and oriented to person, place, and time.  Skin: Skin is warm and dry. No rash noted. She is not diaphoretic.  Psychiatric: Memory, affect and judgment normal.    Lab Results  Component Value Date   TSH 4.323 09/25/2012   Lab Results  Component Value Date   WBC 6.3 11/06/2012   HGB  12.1 11/06/2012   HCT 36.5 11/06/2012   MCV 92.4 11/06/2012   PLT 169 11/06/2012   Lab Results  Component Value Date   CREATININE 1.10 11/06/2012   BUN 17 11/06/2012   NA 141 11/06/2012   K 3.7 11/06/2012   CL 102 11/06/2012   CO2 31 11/06/2012   Lab Results  Component Value Date   ALT 14 09/25/2012   AST 19 09/25/2012   ALKPHOS 16* 09/25/2012   BILITOT 0.5 09/25/2012   Lab Results  Component Value Date   CHOL 144 09/25/2012   Lab Results  Component  Value Date   HDL 34* 09/25/2012   Lab Results  Component Value Date   LDLCALC 66 09/25/2012   Lab Results  Component Value Date   TRIG 220* 09/25/2012   Lab Results  Component Value Date   CHOLHDL 4.2 09/25/2012     Assessment & Plan  HYPERTENSION Adequate control, no changes.   HYPERLIPIDEMIA Tolerating Atorvastatin. Avoid trans fats.   Long term (current) use of anticoagulants Tolerating Warfarin

## 2013-02-01 NOTE — Assessment & Plan Note (Signed)
Tolerating Warfarin

## 2013-02-03 ENCOUNTER — Other Ambulatory Visit: Payer: Self-pay | Admitting: Family Medicine

## 2013-02-03 NOTE — Telephone Encounter (Signed)
Recent rx

## 2013-02-13 ENCOUNTER — Ambulatory Visit (INDEPENDENT_AMBULATORY_CARE_PROVIDER_SITE_OTHER): Payer: Medicare Other | Admitting: *Deleted

## 2013-02-13 DIAGNOSIS — I4891 Unspecified atrial fibrillation: Secondary | ICD-10-CM

## 2013-02-13 DIAGNOSIS — Z7901 Long term (current) use of anticoagulants: Secondary | ICD-10-CM

## 2013-02-13 LAB — POCT INR: INR: 2.3

## 2013-02-16 ENCOUNTER — Other Ambulatory Visit: Payer: Self-pay | Admitting: Family Medicine

## 2013-02-16 NOTE — Telephone Encounter (Signed)
RX denied. Last RX was wrote on 01-16-13 quantity 90 with 3 refills

## 2013-02-18 ENCOUNTER — Telehealth: Payer: Self-pay | Admitting: Family Medicine

## 2013-02-18 MED ORDER — ALPRAZOLAM 0.25 MG PO TABS: 0.2500 mg | ORAL_TABLET | Freq: Three times a day (TID) | ORAL | Status: DC | PRN

## 2013-02-18 NOTE — Telephone Encounter (Signed)
RX was phoned in by tricia fergerson on 01-16-13 quantity 90 with 3 refills  I called [facility] and spoke to [place] and she states they never received this and said patient hasn't picked up an RX since 12-14.  I gave permission to fill 90 with 3 refills

## 2013-02-18 NOTE — Telephone Encounter (Signed)
Refill alprazolam  She is almost out

## 2013-03-13 ENCOUNTER — Ambulatory Visit (INDEPENDENT_AMBULATORY_CARE_PROVIDER_SITE_OTHER): Payer: Medicare Other | Admitting: *Deleted

## 2013-03-13 DIAGNOSIS — Z7901 Long term (current) use of anticoagulants: Secondary | ICD-10-CM

## 2013-03-13 DIAGNOSIS — I4891 Unspecified atrial fibrillation: Secondary | ICD-10-CM

## 2013-03-13 DIAGNOSIS — Z5181 Encounter for therapeutic drug level monitoring: Secondary | ICD-10-CM

## 2013-03-13 LAB — POCT INR: INR: 2.4

## 2013-03-20 ENCOUNTER — Ambulatory Visit (INDEPENDENT_AMBULATORY_CARE_PROVIDER_SITE_OTHER): Payer: Medicare Other | Admitting: Family Medicine

## 2013-03-20 ENCOUNTER — Ambulatory Visit (HOSPITAL_BASED_OUTPATIENT_CLINIC_OR_DEPARTMENT_OTHER)
Admission: RE | Admit: 2013-03-20 | Discharge: 2013-03-20 | Disposition: A | Discharge status: Home / Self Care | Payer: Medicare Other | Source: Ambulatory Visit | Attending: Family Medicine | Admitting: Family Medicine

## 2013-03-20 ENCOUNTER — Encounter: Payer: Self-pay | Admitting: Family Medicine

## 2013-03-20 VITALS — BP 163/76 | HR 76 | Temp 98.7°F | Resp 18 | Ht 60.0 in | Wt 121.0 lb

## 2013-03-20 DIAGNOSIS — R5383 Other fatigue: Principal | ICD-10-CM

## 2013-03-20 DIAGNOSIS — J9819 Other pulmonary collapse: Secondary | ICD-10-CM | POA: Insufficient documentation

## 2013-03-20 DIAGNOSIS — R42 Dizziness and giddiness: Secondary | ICD-10-CM

## 2013-03-20 DIAGNOSIS — R509 Fever, unspecified: Secondary | ICD-10-CM | POA: Insufficient documentation

## 2013-03-20 DIAGNOSIS — R5381 Other malaise: Secondary | ICD-10-CM

## 2013-03-20 DIAGNOSIS — K449 Diaphragmatic hernia without obstruction or gangrene: Secondary | ICD-10-CM | POA: Insufficient documentation

## 2013-03-20 DIAGNOSIS — I517 Cardiomegaly: Secondary | ICD-10-CM | POA: Insufficient documentation

## 2013-03-20 DIAGNOSIS — I4891 Unspecified atrial fibrillation: Secondary | ICD-10-CM | POA: Insufficient documentation

## 2013-03-20 LAB — COMPREHENSIVE METABOLIC PANEL
ALBUMIN: 4.1 g/dL (ref 3.5–5.2)
ALT: 15 U/L (ref 0–35)
AST: 20 U/L (ref 0–37)
Alkaline Phosphatase: 20 U/L — ABNORMAL LOW (ref 39–117)
BUN: 17 mg/dL (ref 6–23)
CALCIUM: 9.6 mg/dL (ref 8.4–10.5)
CO2: 29 mEq/L (ref 19–32)
Chloride: 101 mEq/L (ref 96–112)
Creatinine, Ser: 1.2 mg/dL (ref 0.4–1.2)
GFR: 46.49 mL/min — ABNORMAL LOW (ref >60.00)
Glucose, Bld: 104 mg/dL — ABNORMAL HIGH (ref 70–99)
POTASSIUM: 4.1 meq/L (ref 3.5–5.1)
Sodium: 140 mEq/L (ref 135–145)
Total Bilirubin: 0.7 mg/dL (ref 0.3–1.2)
Total Protein: 7.6 g/dL (ref 6.0–8.3)

## 2013-03-20 LAB — CBC WITH DIFFERENTIAL/PLATELET
BASOS ABS: 0.1 10*3/uL (ref 0.0–0.1)
BASOS PCT: 0.4 % (ref 0.0–3.0)
EOS ABS: 0 10*3/uL (ref 0.0–0.7)
Eosinophils Relative: 0.3 % (ref 0.0–5.0)
HCT: 36.1 % (ref 36.0–46.0)
Hemoglobin: 12.1 g/dL (ref 12.0–15.0)
LYMPHS PCT: 10.2 % — AB (ref 12.0–46.0)
Lymphs Abs: 1.3 10*3/uL (ref 0.7–4.0)
MCHC: 33.6 g/dL (ref 30.0–36.0)
MCV: 91.2 fl (ref 78.0–100.0)
Monocytes Absolute: 0.5 10*3/uL (ref 0.1–1.0)
Monocytes Relative: 3.9 % (ref 3.0–12.0)
Neutro Abs: 10.5 10*3/uL — ABNORMAL HIGH (ref 1.4–7.7)
PLATELETS: 185 10*3/uL (ref 150.0–400.0)
RBC: 3.95 Mil/uL (ref 3.87–5.11)
RDW: 14.7 % — AB (ref 11.5–14.6)
WBC: 12.3 10*3/uL — ABNORMAL HIGH (ref 4.5–10.5)

## 2013-03-20 MED ORDER — CEFDINIR 300 MG PO CAPS: 300.0000 mg | ORAL_CAPSULE | Freq: Two times a day (BID) | ORAL | Status: DC

## 2013-03-20 NOTE — Progress Notes (Signed)
OFFICE NOTE  03/20/2013  CC:  Chief Complaint  Patient presents with  . Headache    feels heavy  . Dizziness    when she gets up it takes a while to balance     HPI: Patient is a 80 y.o. Caucasian female who is here with her daughter today for "just not feeling well today". Onset sometime in middle of night, woke up this morning with feeling of disequilibrium with moving around and with going from sitting to standing.  No vertigo sensation.  No nausea.  No new hearing complaints, no ringing in ears.  No vision complaints.  No speech problems or swallowing difficulty.  No focal weakness.  No mental status changes or cognitive changes.  No known fevers but "chills" last night (does not describe rigors).  Says last night felt some SOB but it was short-lived.  No chest pain.  Last night had an episode of rapid beat for a few minutes.  She has these episodes occ and this is not unusual for her.  No new urinary complaints, no diarrhea.   She does not monitor bp at home.   Pertinent PMH:  Past Medical History  Diagnosis Date  . Paroxysmal a-fib   . Mitral and aortic regurgitation   . Mitral valve prolapse     hx of  . Hypertension   . Hyperlipidemia   . Vitamin D deficiency   . Pneumonia   . Tracheitis   . Gait difficulty   . Anemia   . Melena   . IBS (irritable bowel syndrome)   . Incontinence   . GERD (gastroesophageal reflux disease)   . Osteopenia   . Thyroid disease     hypo  . Personal history of colonic polyps   . Current use of long term anticoagulation   . Cough   . Diarrhea   . Pneumonia     organism unspecified  . Constipation   . UTI (lower urinary tract infection)   . Unspecified adverse effect of other drug, medicinal and biological substance(995.29)   . History of mammogram 2009  . Diverticulosis   . GERD (gastroesophageal reflux disease)   . Hyperlipidemia   . Atrial fibrillation   . Arthritis     hips, knees  . Headache(784.0) 06/22/2012  . Renal  insufficiency 06/28/2012  . Hyperkalemia 06/28/2012   Past Surgical History  Procedure Laterality Date  . Breast biopsy Left   . Cardiac electrophysiology mapping and ablation    . Abdominal hysterectomy      partial, ovaries left in place  . Cataract extraction, bilateral    . Esophagoscopy w/ botox injection    . Esophagogastroduodenoscopy N/A 09/03/2012    Procedure: ESOPHAGOGASTRODUODENOSCOPY (EGD);  Surgeon: Inda Castle, MD;  Location: [facility];  Service: Endoscopy;  Laterality: N/A;  . Botox injection N/A 09/03/2012    Procedure: BOTOX INJECTION;  Surgeon: Inda Castle, MD;  Location: [facility];  Service: Endoscopy;  Laterality: N/A;  . I&d extremity Right 11/06/2012    Procedure: IRRIGATION AND DEBRIDEMENT EXTREMITY Right Ring Finger;  Surgeon: Tennis Must, MD;  Location: [place];  Service: Orthopedics;  Laterality: Right;    MEDS:  Outpatient Prescriptions Prior to Visit  Medication Sig Dispense Refill  . ALPRAZolam (XANAX) 0.25 MG tablet Take 1 tablet (0.25 mg total) by mouth 3 (three) times daily as needed for anxiety.  90 tablet  3  . atorvastatin (LIPITOR) 10 MG tablet Take 1 tablet (10 mg total) by  mouth daily at 6 PM.  30 tablet  2  . atorvastatin (LIPITOR) 10 MG tablet Take 1 tablet (10 mg total) by mouth daily.  30 tablet  1  . calcium acetate (PHOSLO) 667 MG tablet Take 1 tablet by mouth 2 (two) times daily.  60 tablet  2  . Cholecalciferol (VITAMIN D) 2000 UNITS CAPS Take 2,000 Units by mouth every other day.      . esomeprazole (NEXIUM) 40 MG capsule Take 40 mg by mouth 2 (two) times daily before a meal.      . fluticasone (FLONASE) 50 MCG/ACT nasal spray Place 2 sprays into both nostrils daily.  16 g  6  . metoprolol tartrate (LOPRESSOR) 25 MG tablet Take 0.5 tablets (12.5 mg total) by mouth 2 (two) times daily. bid  90 tablet  3  . ramipril (ALTACE) 10 MG capsule Take 1 capsule (10 mg total) by mouth daily.  90 capsule  3  . vitamin B-12 (CYANOCOBALAMIN)  1000 MCG tablet Take 1 tablet (1,000 mcg total) by mouth daily.      Marland Kitchen warfarin (COUMADIN) 2.5 MG tablet Take as directed by Anticoagulation clinic  40 tablet  3   No facility-administered medications prior to visit.    PE: Blood pressure 163/76, pulse 76, temperature 98.7 F (37.1 C), temperature source Oral, resp. rate 18, height 5' (1.524 m), weight 121 lb (54.885 kg), SpO2 96.00%. Gen: Alert, well appearing.  Patient is oriented to person, place, time, and situation. AFFECT: pleasant, lucid thought and speech.  She laughs/smiles intermittently. ZSW:FUXN: no injection, icteris, swelling, or exudate.  EOMI, PERRLA. Mouth: lips without lesion/swelling.  Oral mucosa pink and moist. Oropharynx without erythema, exudate, or swelling.  Neck - No masses or thyromegaly or limitation in range of motion CV: RRR, 2/6 syst murmur.  No rub or gallop. LUNGS: very subtle/soft insp crackles in both bases, L>R, slightly diminished breath sounds in bases.  No wheezing or prolonged exp phase.  Breathing nonlabored. EXT: no clubbing, cyanosis, or edema.  Neuro: CN 2-12 intact bilaterally, strength 5/5 in proximal and distal upper extremities and lower extremities bilaterally. No tremor.  No disdiadochokinesis.  No ataxia.  Upper extremity and lower extremity DTRs symmetric.  No pronator drift.   IMPRESSION AND PLAN:  Nonspecific fatigue/malaise, question of viral syndrome. Says pneumonia presented like this before for her. Will check CXR today as well as CMET and CBC w/diff.   FOLLOW UP:  To be determined based on results of pending workup.  ADDENDUM 03/20/13: CXR showed subtle change c/w infiltrate vs atelectasis: omnicef started. Needs CXR to document resolution per radiologist's recommendation.  FYI note sent to his PCP, Dr. Charlett Blake.

## 2013-03-21 ENCOUNTER — Other Ambulatory Visit: Payer: Self-pay | Admitting: Family Medicine

## 2013-03-21 DIAGNOSIS — J189 Pneumonia, unspecified organism: Secondary | ICD-10-CM

## 2013-03-21 DIAGNOSIS — J181 Lobar pneumonia, unspecified organism: Principal | ICD-10-CM

## 2013-03-21 NOTE — Progress Notes (Signed)
Thanks, xray ordered

## 2013-03-25 ENCOUNTER — Other Ambulatory Visit: Payer: Self-pay | Admitting: Family Medicine

## 2013-04-07 ENCOUNTER — Other Ambulatory Visit: Payer: Self-pay | Admitting: Family Medicine

## 2013-04-10 ENCOUNTER — Ambulatory Visit (INDEPENDENT_AMBULATORY_CARE_PROVIDER_SITE_OTHER): Payer: Medicare Other | Admitting: Pharmacist

## 2013-04-10 DIAGNOSIS — I4891 Unspecified atrial fibrillation: Secondary | ICD-10-CM

## 2013-04-10 DIAGNOSIS — Z7901 Long term (current) use of anticoagulants: Secondary | ICD-10-CM

## 2013-04-10 DIAGNOSIS — Z5181 Encounter for therapeutic drug level monitoring: Secondary | ICD-10-CM

## 2013-04-10 LAB — POCT INR: INR: 2.9

## 2013-05-08 ENCOUNTER — Ambulatory Visit (INDEPENDENT_AMBULATORY_CARE_PROVIDER_SITE_OTHER): Payer: Medicare Other | Admitting: *Deleted

## 2013-05-08 ENCOUNTER — Ambulatory Visit (HOSPITAL_BASED_OUTPATIENT_CLINIC_OR_DEPARTMENT_OTHER)
Admission: RE | Admit: 2013-05-08 | Discharge: 2013-05-08 | Disposition: A | Discharge status: Home / Self Care | Payer: Medicare Other | Source: Ambulatory Visit | Attending: Family Medicine | Admitting: Family Medicine

## 2013-05-08 ENCOUNTER — Ambulatory Visit (INDEPENDENT_AMBULATORY_CARE_PROVIDER_SITE_OTHER): Payer: Medicare Other | Admitting: Family Medicine

## 2013-05-08 ENCOUNTER — Encounter: Payer: Self-pay | Admitting: Family Medicine

## 2013-05-08 VITALS — BP 162/80 | HR 83 | Temp 98.2°F | Ht 60.0 in | Wt 118.0 lb

## 2013-05-08 DIAGNOSIS — Z5181 Encounter for therapeutic drug level monitoring: Secondary | ICD-10-CM

## 2013-05-08 DIAGNOSIS — Z7901 Long term (current) use of anticoagulants: Secondary | ICD-10-CM

## 2013-05-08 DIAGNOSIS — D649 Anemia, unspecified: Secondary | ICD-10-CM

## 2013-05-08 DIAGNOSIS — E785 Hyperlipidemia, unspecified: Secondary | ICD-10-CM

## 2013-05-08 DIAGNOSIS — I10 Essential (primary) hypertension: Secondary | ICD-10-CM

## 2013-05-08 DIAGNOSIS — M25539 Pain in unspecified wrist: Secondary | ICD-10-CM

## 2013-05-08 DIAGNOSIS — E559 Vitamin D deficiency, unspecified: Secondary | ICD-10-CM

## 2013-05-08 DIAGNOSIS — M19049 Primary osteoarthritis, unspecified hand: Secondary | ICD-10-CM | POA: Insufficient documentation

## 2013-05-08 DIAGNOSIS — K219 Gastro-esophageal reflux disease without esophagitis: Secondary | ICD-10-CM

## 2013-05-08 DIAGNOSIS — J4489 Other specified chronic obstructive pulmonary disease: Secondary | ICD-10-CM | POA: Insufficient documentation

## 2013-05-08 DIAGNOSIS — J449 Chronic obstructive pulmonary disease, unspecified: Secondary | ICD-10-CM | POA: Insufficient documentation

## 2013-05-08 DIAGNOSIS — J189 Pneumonia, unspecified organism: Secondary | ICD-10-CM

## 2013-05-08 DIAGNOSIS — W19XXXA Unspecified fall, initial encounter: Secondary | ICD-10-CM | POA: Insufficient documentation

## 2013-05-08 DIAGNOSIS — I4891 Unspecified atrial fibrillation: Secondary | ICD-10-CM

## 2013-05-08 DIAGNOSIS — E039 Hypothyroidism, unspecified: Secondary | ICD-10-CM

## 2013-05-08 LAB — HEPATIC FUNCTION PANEL
ALBUMIN: 4.2 g/dL (ref 3.5–5.2)
ALK PHOS: 20 U/L — AB (ref 39–117)
ALT: 16 U/L (ref 0–35)
AST: 19 U/L (ref 0–37)
Bilirubin, Direct: 0.1 mg/dL (ref 0.0–0.3)
Indirect Bilirubin: 0.4 mg/dL (ref 0.2–1.2)
Total Bilirubin: 0.5 mg/dL (ref 0.2–1.2)
Total Protein: 7.1 g/dL (ref 6.0–8.3)

## 2013-05-08 LAB — RENAL FUNCTION PANEL
ALBUMIN: 4.2 g/dL (ref 3.5–5.2)
BUN: 16 mg/dL (ref 6–23)
CHLORIDE: 103 meq/L (ref 96–112)
CO2: 26 meq/L (ref 19–32)
CREATININE: 1.08 mg/dL (ref 0.50–1.10)
Calcium: 9.6 mg/dL (ref 8.4–10.5)
GLUCOSE: 97 mg/dL (ref 70–99)
Phosphorus: 4.7 mg/dL — ABNORMAL HIGH (ref 2.3–4.6)
Potassium: 4.2 mEq/L (ref 3.5–5.3)
Sodium: 141 mEq/L (ref 135–145)

## 2013-05-08 LAB — LIPID PANEL
Cholesterol: 131 mg/dL (ref 0–200)
HDL: 36 mg/dL — AB (ref >39)
LDL Cholesterol: 65 mg/dL (ref 0–99)
Total CHOL/HDL Ratio: 3.6 Ratio
Triglycerides: 151 mg/dL — ABNORMAL HIGH (ref <150)
VLDL: 30 mg/dL (ref 0–40)

## 2013-05-08 LAB — CBC
HCT: 38.6 % (ref 36.0–46.0)
Hemoglobin: 13.1 g/dL (ref 12.0–15.0)
MCH: 30 pg (ref 26.0–34.0)
MCHC: 33.9 g/dL (ref 30.0–36.0)
MCV: 88.5 fL (ref 78.0–100.0)
PLATELETS: 226 10*3/uL (ref 150–400)
RBC: 4.36 MIL/uL (ref 3.87–5.11)
RDW: 14.3 % (ref 11.5–15.5)
WBC: 6 10*3/uL (ref 4.0–10.5)

## 2013-05-08 LAB — POCT INR: INR: 2

## 2013-05-08 LAB — TSH: TSH: 2.757 u[IU]/mL (ref 0.350–4.500)

## 2013-05-08 NOTE — Patient Instructions (Signed)
Wrist Pain Wrist injuries are frequent in adults and children. A sprain is an injury to the ligaments that hold your bones together. A strain is an injury to muscle or muscle cord-like structures (tendons) from stretching or pulling. Generally, when wrists are moderately tender to touch following a fall or injury, a break in the bone (fracture) may be present. Most wrist sprains or strains are better in 3 to 5 days, but complete healing may take several weeks. HOME CARE INSTRUCTIONS   Put ice on the injured area.  Put ice in a plastic bag.  Place a towel between your skin and the bag.  Leave the ice on for 15-20 minutes, 03-04 times a day, for the first 2 days.  Keep your arm raised above the level of your heart whenever possible to reduce swelling and pain.  Rest the injured area for at least 48 hours or as directed by your caregiver.  If a splint or elastic bandage has been applied, use it for as long as directed by your caregiver or until seen by a caregiver for a follow-up exam.  Only take over-the-counter or prescription medicines for pain, discomfort, or fever as directed by your caregiver.  Keep all follow-up appointments. You may need to follow up with a specialist or have follow-up X-rays. Improvement in pain level is not a guarantee that you did not fracture a bone in your wrist. The only way to determine whether or not you have a broken bone is by X-ray. SEEK IMMEDIATE MEDICAL CARE IF:   Your fingers are swollen, very red, white, or cold and blue.  Your fingers are numb or tingling.  You have increasing pain.  You have difficulty moving your fingers. MAKE SURE YOU:   Understand these instructions.  Will watch your condition.  Will get help right away if you are not doing well or get worse. Document Released: 09/27/2004 Document Revised: 03/12/2011 Document Reviewed: 02/08/2010 [employer] Patient Information 2014 [employer]  

## 2013-05-08 NOTE — Progress Notes (Signed)
Pre visit review using our clinic review tool, if applicable. No additional management support is needed unless otherwise documented below in the visit note. 

## 2013-05-09 ENCOUNTER — Other Ambulatory Visit: Payer: Self-pay | Admitting: Family Medicine

## 2013-05-09 DIAGNOSIS — M25532 Pain in left wrist: Secondary | ICD-10-CM

## 2013-05-09 LAB — VITAMIN D 25 HYDROXY (VIT D DEFICIENCY, FRACTURES): Vit D, 25-Hydroxy: 45 ng/mL (ref 30–89)

## 2013-05-13 ENCOUNTER — Encounter: Payer: Self-pay | Admitting: Family Medicine

## 2013-05-13 DIAGNOSIS — M25539 Pain in unspecified wrist: Secondary | ICD-10-CM | POA: Insufficient documentation

## 2013-05-13 NOTE — Assessment & Plan Note (Signed)
Avoid offending foods, start probiotics. Do not eat large meals in late evening and consider raising head of bed. Continue Nexium

## 2013-05-13 NOTE — Assessment & Plan Note (Signed)
Increase leafy greens, consider increased lean red meat and using cast iron cookware. Continue to monitor, report any concerns 

## 2013-05-13 NOTE — Assessment & Plan Note (Signed)
Improved on recheck, no changes

## 2013-05-13 NOTE — Progress Notes (Signed)
Patient ID: [name] [name], female   DOB: 04-Mar-1924, 80 y.o.   MRN: [id number] [name] [name] [id number] [birth year] 05/13/2013      Progress Note-Follow Up  Subjective  Chief Complaint  Chief Complaint  Patient presents with  . Follow-up    3 month    HPI  Patient is a 80 year old female in today for routine medical care. [place] for followup. She feels fairly well at this point except for some discomfort in her left hand status post a fall a month ago. She fell onto her outstretched hand and has persistent pain at the ulnar aspect of her wrist. There was swelling which has largely improved. She has a very mild cough otherwise but no other major complaints. Denies CP/palp/SOB/HA/congestion/fevers/GI or GU c/o. Taking meds as prescribed Past Medical History  Diagnosis Date  . Paroxysmal a-fib   . Mitral and aortic regurgitation   . Mitral valve prolapse     hx of  . Hypertension   . Hyperlipidemia   . Vitamin D deficiency   . Pneumonia   . Tracheitis   . Gait difficulty   . Anemia   . Melena   . IBS (irritable bowel syndrome)   . Incontinence   . GERD (gastroesophageal reflux disease)   . Osteopenia   . Thyroid disease     hypo  . Personal history of colonic polyps   . Current use of long term anticoagulation   . Cough   . Diarrhea   . Pneumonia     organism unspecified  . Constipation   . UTI (lower urinary tract infection)   . Unspecified adverse effect of other drug, medicinal and biological substance(995.29)   . History of mammogram 2009  . Diverticulosis   . GERD (gastroesophageal reflux disease)   . Hyperlipidemia   . Atrial fibrillation   . Arthritis     hips, knees  . Headache(784.0) 06/22/2012  . Renal insufficiency 06/28/2012  . Hyperkalemia 06/28/2012    Past Surgical History  Procedure Laterality Date  . Breast biopsy Left   . Cardiac electrophysiology mapping and ablation    . Abdominal hysterectomy      partial, ovaries left in place  .  Cataract extraction, bilateral    . Esophagoscopy w/ botox injection    . Esophagogastroduodenoscopy N/A 09/03/2012    Procedure: ESOPHAGOGASTRODUODENOSCOPY (EGD);  Surgeon: Inda Castle, MD;  Location: [facility];  Service: Endoscopy;  Laterality: N/A;  . Botox injection N/A 09/03/2012    Procedure: BOTOX INJECTION;  Surgeon: Inda Castle, MD;  Location: [facility];  Service: Endoscopy;  Laterality: N/A;  . I&d extremity Right 11/06/2012    Procedure: IRRIGATION AND DEBRIDEMENT EXTREMITY Right Ring Finger;  Surgeon: Tennis Must, MD;  Location: [facility];  Service: Orthopedics;  Laterality: Right;    Family History  Problem Relation Age of Onset  . Diabetes Mother   . CVA Mother   . COPD Father   . Heart disease Maternal Aunt   . Heart disease Maternal Uncle   . Allergies Daughter   . COPD Daughter   . Stroke Daughter   . COPD Daughter     previous smoker  . Stroke Maternal Grandfather     History   Social History  . Marital Status: Widowed    Spouse Name: N/A    Number of Children: 3  . Years of Education: N/A   Occupational History  .     Social History Main  Topics  . Smoking status: Never Smoker   . Smokeless tobacco: Never Used  . Alcohol Use: No  . Drug Use: No  . Sexual Activity: No   Other Topics Concern  . Not on file   Social History Narrative  . No narrative on file    Current Outpatient Prescriptions on File Prior to Visit  Medication Sig Dispense Refill  . ALPRAZolam (XANAX) 0.25 MG tablet Take 1 tablet (0.25 mg total) by mouth 3 (three) times daily as needed for anxiety.  90 tablet  3  . atorvastatin (LIPITOR) 10 MG tablet Take 1 tablet (10 mg total) by mouth daily at 6 PM.  30 tablet  2  . calcium acetate (PHOSLO) 667 MG tablet Take 1 tablet by mouth 2 (two) times daily.  60 tablet  2  . Cholecalciferol (VITAMIN D) 2000 UNITS CAPS Take 2,000 Units by mouth every other day.      . fluticasone (FLONASE) 50 MCG/ACT nasal spray Place 2 sprays into  both nostrils daily.  16 g  6  . metoprolol tartrate (LOPRESSOR) 25 MG tablet Take 0.5 tablets (12.5 mg total) by mouth 2 (two) times daily. bid  90 tablet  3  . NEXIUM 40 MG capsule TAKE ONE CAPSULE IN THE MORNING AND 1 IN THE EVENING FOR REFLUX AND COUGH  180 capsule  1  . ramipril (ALTACE) 10 MG capsule Take 1 capsule (10 mg total) by mouth daily.  90 capsule  3  . vitamin B-12 (CYANOCOBALAMIN) 1000 MCG tablet Take 1 tablet (1,000 mcg total) by mouth daily.      Marland Kitchen warfarin (COUMADIN) 2.5 MG tablet Take as directed by Anticoagulation clinic  40 tablet  3   No current facility-administered medications on file prior to visit.    Allergies  Allergen Reactions  . Darifenacin Hydrobromide     REACTION: causes her dizziness  . Sulfonamide Derivatives   . Tramadol Other (See Comments)    Insomnia, anorexia    Review of Systems  Review of Systems  Constitutional: Negative for fever and malaise/fatigue.  HENT: Negative for congestion.   Eyes: Negative for discharge.  Respiratory: Negative for shortness of breath.   Cardiovascular: Negative for chest pain, palpitations and leg swelling.  Gastrointestinal: Positive for heartburn. Negative for nausea, abdominal pain and diarrhea.  Genitourinary: Negative for dysuria.  Musculoskeletal: Negative for falls.  Skin: Negative for rash.  Neurological: Negative for loss of consciousness and headaches.  Endo/Heme/Allergies: Negative for polydipsia.  Psychiatric/Behavioral: Negative for depression and suicidal ideas. The patient is not nervous/anxious and does not have insomnia.     Objective  BP 162/80  Pulse 83  Temp(Src) 98.2 F (36.8 C) (Oral)  Ht 5' (1.524 m)  Wt 118 lb (53.524 kg)  BMI 23.05 kg/m2  SpO2 96%  Physical Exam  Physical Exam  Constitutional: She is oriented to person, place, and time and well-developed, well-nourished, and in no distress. No distress.  HENT:  Head: Normocephalic and atraumatic.  Eyes: Conjunctivae  are normal.  Neck: Neck supple. No thyromegaly present.  Cardiovascular: Normal rate, regular rhythm and normal heart sounds.   No murmur heard. Pulmonary/Chest: Effort normal and breath sounds normal. She has no wheezes.  Abdominal: She exhibits no distension and no mass.  Musculoskeletal: She exhibits no edema.  Lymphadenopathy:    She has no cervical adenopathy.  Neurological: She is alert and oriented to person, place, and time.  Skin: Skin is warm and dry. No rash noted. She is  not diaphoretic.  Psychiatric: Memory, affect and judgment normal.    Lab Results  Component Value Date   TSH 2.757 05/08/2013   Lab Results  Component Value Date   WBC 6.0 05/08/2013   HGB 13.1 05/08/2013   HCT 38.6 05/08/2013   MCV 88.5 05/08/2013   PLT 226 05/08/2013   Lab Results  Component Value Date   CREATININE 1.08 05/08/2013   BUN 16 05/08/2013   NA 141 05/08/2013   K 4.2 05/08/2013   CL 103 05/08/2013   CO2 26 05/08/2013   Lab Results  Component Value Date   ALT 16 05/08/2013   AST 19 05/08/2013   ALKPHOS 20* 05/08/2013   BILITOT 0.5 05/08/2013   Lab Results  Component Value Date   CHOL 131 05/08/2013   Lab Results  Component Value Date   HDL 36* 05/08/2013   Lab Results  Component Value Date   LDLCALC 65 05/08/2013   Lab Results  Component Value Date   TRIG 151* 05/08/2013   Lab Results  Component Value Date   CHOLHDL 3.6 05/08/2013     Assessment & Plan    HYPERTENSION Improved on recheck, no changes  HYPERLIPIDEMIA Tolerating statin, encouraged heart healthy diet, avoid trans fats, minimize simple carbs and saturated fats. Increase exercise as tolerated  ANEMIA Increase leafy greens, consider increased lean red meat and using cast iron cookware. Continue to monitor, report any concerns  GERD Avoid offending foods, start probiotics. Do not eat large meals in late evening and consider raising head of bed. Continue Nexium  Wrist pain Left wrist s/p fall at ulnar aspect, xray shows possible  fracture due to persistent pain will refer to orthopaedics. Encouraged ice and small amount of topical NSAIDs

## 2013-05-13 NOTE — Assessment & Plan Note (Signed)
Tolerating statin, encouraged heart healthy diet, avoid trans fats, minimize simple carbs and saturated fats. Increase exercise as tolerated 

## 2013-05-13 NOTE — Assessment & Plan Note (Signed)
Left wrist s/p fall at ulnar aspect, xray shows possible fracture due to persistent pain will refer to orthopaedics. Encouraged ice and small amount of topical NSAIDs

## 2013-05-15 ENCOUNTER — Other Ambulatory Visit: Payer: Self-pay

## 2013-05-15 MED ORDER — WARFARIN SODIUM 2.5 MG PO TABS: ORAL_TABLET | ORAL | Status: DC

## 2013-06-05 ENCOUNTER — Ambulatory Visit (INDEPENDENT_AMBULATORY_CARE_PROVIDER_SITE_OTHER): Payer: Medicare Other | Admitting: *Deleted

## 2013-06-05 DIAGNOSIS — Z7901 Long term (current) use of anticoagulants: Secondary | ICD-10-CM

## 2013-06-05 DIAGNOSIS — I4891 Unspecified atrial fibrillation: Secondary | ICD-10-CM

## 2013-06-05 DIAGNOSIS — Z5181 Encounter for therapeutic drug level monitoring: Secondary | ICD-10-CM

## 2013-06-05 LAB — POCT INR: INR: 3.3

## 2013-06-16 ENCOUNTER — Telehealth: Payer: Self-pay | Admitting: Family Medicine

## 2013-06-16 MED ORDER — ALPRAZOLAM 0.25 MG PO TABS: 0.2500 mg | ORAL_TABLET | Freq: Three times a day (TID) | ORAL | Status: DC | PRN

## 2013-06-16 NOTE — Telephone Encounter (Signed)
RX printed for md to sign and fax 

## 2013-06-16 NOTE — Telephone Encounter (Signed)
Refill-alprazolam  [facility]

## 2013-06-19 ENCOUNTER — Ambulatory Visit (INDEPENDENT_AMBULATORY_CARE_PROVIDER_SITE_OTHER): Payer: Medicare Other | Admitting: *Deleted

## 2013-06-19 DIAGNOSIS — I4891 Unspecified atrial fibrillation: Secondary | ICD-10-CM

## 2013-06-19 DIAGNOSIS — Z7901 Long term (current) use of anticoagulants: Secondary | ICD-10-CM

## 2013-06-19 DIAGNOSIS — Z5181 Encounter for therapeutic drug level monitoring: Secondary | ICD-10-CM

## 2013-06-19 LAB — POCT INR: INR: 2.2

## 2013-06-22 ENCOUNTER — Emergency Department (HOSPITAL_BASED_OUTPATIENT_CLINIC_OR_DEPARTMENT_OTHER)
Admission: EM | Admit: 2013-06-22 | Discharge: 2013-06-23 | Disposition: A | Discharge status: Home / Self Care | Payer: Medicare Other | Attending: Emergency Medicine | Admitting: Emergency Medicine

## 2013-06-22 ENCOUNTER — Encounter (HOSPITAL_BASED_OUTPATIENT_CLINIC_OR_DEPARTMENT_OTHER): Payer: Self-pay | Admitting: Emergency Medicine

## 2013-06-22 DIAGNOSIS — E785 Hyperlipidemia, unspecified: Secondary | ICD-10-CM | POA: Insufficient documentation

## 2013-06-22 DIAGNOSIS — IMO0002 Reserved for concepts with insufficient information to code with codable children: Secondary | ICD-10-CM | POA: Insufficient documentation

## 2013-06-22 DIAGNOSIS — I10 Essential (primary) hypertension: Secondary | ICD-10-CM | POA: Insufficient documentation

## 2013-06-22 DIAGNOSIS — E860 Dehydration: Secondary | ICD-10-CM | POA: Insufficient documentation

## 2013-06-22 DIAGNOSIS — R350 Frequency of micturition: Secondary | ICD-10-CM | POA: Insufficient documentation

## 2013-06-22 DIAGNOSIS — Z79899 Other long term (current) drug therapy: Secondary | ICD-10-CM | POA: Insufficient documentation

## 2013-06-22 DIAGNOSIS — Z7901 Long term (current) use of anticoagulants: Secondary | ICD-10-CM | POA: Insufficient documentation

## 2013-06-22 DIAGNOSIS — I080 Rheumatic disorders of both mitral and aortic valves: Secondary | ICD-10-CM | POA: Insufficient documentation

## 2013-06-22 DIAGNOSIS — K219 Gastro-esophageal reflux disease without esophagitis: Secondary | ICD-10-CM | POA: Insufficient documentation

## 2013-06-22 DIAGNOSIS — E559 Vitamin D deficiency, unspecified: Secondary | ICD-10-CM | POA: Insufficient documentation

## 2013-06-22 DIAGNOSIS — Z8639 Personal history of other endocrine, nutritional and metabolic disease: Secondary | ICD-10-CM | POA: Insufficient documentation

## 2013-06-22 DIAGNOSIS — Z8709 Personal history of other diseases of the respiratory system: Secondary | ICD-10-CM | POA: Insufficient documentation

## 2013-06-22 DIAGNOSIS — Z862 Personal history of diseases of the blood and blood-forming organs and certain disorders involving the immune mechanism: Secondary | ICD-10-CM | POA: Insufficient documentation

## 2013-06-22 DIAGNOSIS — Z9889 Other specified postprocedural states: Secondary | ICD-10-CM | POA: Insufficient documentation

## 2013-06-22 DIAGNOSIS — I4891 Unspecified atrial fibrillation: Secondary | ICD-10-CM | POA: Insufficient documentation

## 2013-06-22 DIAGNOSIS — I059 Rheumatic mitral valve disease, unspecified: Secondary | ICD-10-CM | POA: Insufficient documentation

## 2013-06-22 DIAGNOSIS — Z87440 Personal history of urinary (tract) infections: Secondary | ICD-10-CM | POA: Insufficient documentation

## 2013-06-22 DIAGNOSIS — Z860100 Personal history of colon polyps, unspecified: Secondary | ICD-10-CM | POA: Insufficient documentation

## 2013-06-22 DIAGNOSIS — R112 Nausea with vomiting, unspecified: Secondary | ICD-10-CM | POA: Insufficient documentation

## 2013-06-22 DIAGNOSIS — Z8701 Personal history of pneumonia (recurrent): Secondary | ICD-10-CM | POA: Insufficient documentation

## 2013-06-22 DIAGNOSIS — R51 Headache: Secondary | ICD-10-CM | POA: Insufficient documentation

## 2013-06-22 DIAGNOSIS — M129 Arthropathy, unspecified: Secondary | ICD-10-CM | POA: Insufficient documentation

## 2013-06-22 DIAGNOSIS — R197 Diarrhea, unspecified: Secondary | ICD-10-CM | POA: Insufficient documentation

## 2013-06-22 LAB — CBC WITH DIFFERENTIAL/PLATELET
Basophils Absolute: 0 10*3/uL (ref 0.0–0.1)
Basophils Relative: 0 % (ref 0–1)
Eosinophils Absolute: 0 10*3/uL (ref 0.0–0.7)
Eosinophils Relative: 0 % (ref 0–5)
HCT: 39.4 % (ref 36.0–46.0)
Hemoglobin: 13.3 g/dL (ref 12.0–15.0)
Lymphocytes Relative: 9 % — ABNORMAL LOW (ref 12–46)
Lymphs Abs: 1.2 10*3/uL (ref 0.7–4.0)
MCH: 30.8 pg (ref 26.0–34.0)
MCHC: 33.8 g/dL (ref 30.0–36.0)
MCV: 91.2 fL (ref 78.0–100.0)
Monocytes Absolute: 0.7 10*3/uL (ref 0.1–1.0)
Monocytes Relative: 5 % (ref 3–12)
Neutro Abs: 10.7 10*3/uL — ABNORMAL HIGH (ref 1.7–7.7)
Neutrophils Relative %: 85 % — ABNORMAL HIGH (ref 43–77)
Platelets: 192 10*3/uL (ref 150–400)
RBC: 4.32 MIL/uL (ref 3.87–5.11)
RDW: 14.2 % (ref 11.5–15.5)
WBC: 12.6 10*3/uL — ABNORMAL HIGH (ref 4.0–10.5)

## 2013-06-22 LAB — URINALYSIS, ROUTINE W REFLEX MICROSCOPIC
BILIRUBIN URINE: NEGATIVE
GLUCOSE, UA: NEGATIVE mg/dL
Hgb urine dipstick: NEGATIVE
KETONES UR: NEGATIVE mg/dL
Nitrite: NEGATIVE
PH: 5 (ref 5.0–8.0)
PROTEIN: NEGATIVE mg/dL
Specific Gravity, Urine: 1.017 (ref 1.005–1.030)
Urobilinogen, UA: 0.2 mg/dL (ref 0.0–1.0)

## 2013-06-22 LAB — I-STAT CG4 LACTIC ACID, ED: Lactic Acid, Venous: 2.46 mmol/L — ABNORMAL HIGH (ref 0.5–2.2)

## 2013-06-22 LAB — URINE MICROSCOPIC-ADD ON

## 2013-06-22 MED ORDER — SODIUM CHLORIDE 0.9 % IV BOLUS (SEPSIS)
1000.0000 mL | Freq: Once | INTRAVENOUS | Status: AC
  Administered 2013-06-22: 1000 mL via INTRAVENOUS

## 2013-06-22 MED ORDER — ONDANSETRON HCL 4 MG/2ML IJ SOLN
4.0000 mg | Freq: Once | INTRAMUSCULAR | Status: AC
  Administered 2013-06-22: 4 mg via INTRAVENOUS
  Filled 2013-06-22: qty 2

## 2013-06-22 NOTE — ED Provider Notes (Signed)
CSN: [id number]     Arrival date & time 06/22/13  2112 History  This chart was scribed for Mariea Clonts, MD by Irene Pap, ED Scribe. This patient was seen in room MH08/MH08 and patient care was started at 11:04 PM.     Chief Complaint  Patient presents with  . Vomiting   The history is provided by the patient. No language interpreter was used.   HPI Comments: [name] [name] is a 80 y.o. female who presents to the Emergency Department complaining of vomiting and diarrhea onset a few hours ago. She states that she had fish sticks and french fries that were freshly cooked for dinner, and the symptoms began after dinner. Patient states that she has had 3 episodes of watery diarrhea today. She states that she has a gradually worsening headache that resulted after the vomiting that is similar to past headaches. Patient also reports associated nausea. Patient denies fever, chills, chest pain, skin rashes, leg pain and leg swelling, hematuria, hematochezia, and any other associated symptoms. Patient states that she is currently on Coumadin for history of AFib. Patient denies history of MI. Patient states that she has a history of GERD with associated cough, but denies history of ulcers. Patient reports that she currently has a cracked bone in her left hand. Patient reports that she does not smoke or drink.   Past Medical History  Diagnosis Date  . Paroxysmal a-fib   . Mitral and aortic regurgitation   . Mitral valve prolapse     hx of  . Hypertension   . Hyperlipidemia   . Vitamin D deficiency   . Pneumonia   . Tracheitis   . Gait difficulty   . Anemia   . Melena   . IBS (irritable bowel syndrome)   . Incontinence   . GERD (gastroesophageal reflux disease)   . Osteopenia   . Thyroid disease     hypo  . Personal history of colonic polyps   . Current use of long term anticoagulation   . Cough   . Diarrhea   . Pneumonia     organism unspecified  . Constipation   . UTI (lower  urinary tract infection)   . Unspecified adverse effect of other drug, medicinal and biological substance(995.29)   . History of mammogram 2009  . Diverticulosis   . GERD (gastroesophageal reflux disease)   . Hyperlipidemia   . Atrial fibrillation   . Arthritis     hips, knees  . Headache(784.0) 06/22/2012  . Renal insufficiency 06/28/2012  . Hyperkalemia 06/28/2012   Past Surgical History  Procedure Laterality Date  . Breast biopsy Left   . Cardiac electrophysiology mapping and ablation    . Abdominal hysterectomy      partial, ovaries left in place  . Cataract extraction, bilateral    . Esophagoscopy w/ botox injection    . Esophagogastroduodenoscopy N/A 09/03/2012    Procedure: ESOPHAGOGASTRODUODENOSCOPY (EGD);  Surgeon: Inda Castle, MD;  Location: [facility];  Service: Endoscopy;  Laterality: N/A;  . Botox injection N/A 09/03/2012    Procedure: BOTOX INJECTION;  Surgeon: Inda Castle, MD;  Location: [facility];  Service: Endoscopy;  Laterality: N/A;  . I&d extremity Right 11/06/2012    Procedure: IRRIGATION AND DEBRIDEMENT EXTREMITY Right Ring Finger;  Surgeon: Tennis Must, MD;  Location: [facility];  Service: Orthopedics;  Laterality: Right;   Family History  Problem Relation Age of Onset  . Diabetes Mother   . CVA Mother   .  COPD Father   . Heart disease Maternal Aunt   . Heart disease Maternal Uncle   . Allergies Daughter   . COPD Daughter   . Stroke Daughter   . COPD Daughter     previous smoker  . Stroke Maternal Grandfather    History  Substance Use Topics  . Smoking status: Never Smoker   . Smokeless tobacco: Never Used  . Alcohol Use: No   OB History   Grav Para Term Preterm Abortions TAB SAB Ect Mult Living                 Review of Systems  Constitutional: Negative for fever and chills.  Eyes: Negative for visual disturbance.  Respiratory: Negative for shortness of breath.   Cardiovascular: Negative for chest pain and leg swelling.   Gastrointestinal: Positive for nausea, vomiting and diarrhea. Negative for abdominal pain and blood in stool.  Genitourinary: Positive for frequency. Negative for dysuria and hematuria.  Neurological: Positive for headaches.  All other systems reviewed and are negative.     Allergies  Darifenacin hydrobromide; Sulfonamide derivatives; and Tramadol  Home Medications   Prior to Admission medications   Medication Sig Start Date End Date Taking? Authorizing Provider  ALPRAZolam (XANAX) 0.25 MG tablet Take 1 tablet (0.25 mg total) by mouth 3 (three) times daily as needed for anxiety. 06/16/13   Mosie Lukes, MD  atorvastatin (LIPITOR) 10 MG tablet Take 1 tablet (10 mg total) by mouth daily at 6 PM. 01/30/13   Mosie Lukes, MD  calcium acetate (PHOSLO) 667 MG tablet Take 1 tablet by mouth 2 (two) times daily. 01/30/13   Mosie Lukes, MD  Cholecalciferol (VITAMIN D) 2000 UNITS CAPS Take 2,000 Units by mouth every other day. 02/20/12   Mosie Lukes, MD  fluticasone (FLONASE) 50 MCG/ACT nasal spray Place 2 sprays into both nostrils daily. 01/29/13   Leeanne Rio, PA-C  metoprolol tartrate (LOPRESSOR) 25 MG tablet Take 0.5 tablets (12.5 mg total) by mouth 2 (two) times daily. bid 01/30/13   Mosie Lukes, MD  NEXIUM 40 MG capsule TAKE ONE CAPSULE IN THE MORNING AND 1 IN THE EVENING FOR REFLUX AND COUGH 03/25/13   Mosie Lukes, MD  ramipril (ALTACE) 10 MG capsule Take 1 capsule (10 mg total) by mouth daily. 01/30/13   Mosie Lukes, MD  vitamin B-12 (CYANOCOBALAMIN) 1000 MCG tablet Take 1 tablet (1,000 mcg total) by mouth daily. 02/20/12   Mosie Lukes, MD  warfarin (COUMADIN) 2.5 MG tablet Take as directed by Anticoagulation clinic 05/15/13   Dorothy Spark, MD   BP 170/65  Pulse 64  Temp(Src) 98.4 F (36.9 C) (Oral)  Resp 18  Ht 4\' 11"  (1.499 m)  Wt 118 lb (53.524 kg)  BMI 23.82 kg/m2  SpO2 98% Physical Exam  Nursing note and vitals reviewed. Constitutional: She is  oriented to person, place, and time. She appears well-developed and well-nourished.  HENT:  Head: Normocephalic and atraumatic.  Mouth/Throat: Mucous membranes are dry.  Mildly dry  Eyes: EOM are normal.  Neck: Normal range of motion. Neck supple.  Cardiovascular: Normal rate, regular rhythm and normal heart sounds.   Good peripheral pulse  Pulmonary/Chest: Effort normal and breath sounds normal. No respiratory distress.  No retractions  Abdominal: Soft. Bowel sounds are normal. She exhibits no distension. There is no tenderness. There is no guarding.  Very active   Musculoskeletal: Normal range of motion. She exhibits no edema.  No  significant swelling  Neurological: She is alert and oriented to person, place, and time. No cranial nerve deficit.  Cranial nerves grossly intact  Skin: Skin is warm and dry. No rash noted.  Psychiatric: She has a normal mood and affect. Her behavior is normal.    ED Course  Procedures (including critical care time) DIAGNOSTIC STUDIES: Oxygen Saturation is 98% on room air, normal by my interpretation.    COORDINATION OF CARE: 11:10 PM-Discussed treatment plan which includes IV fluids, nausea medication with pt at bedside and pt agreed to plan.   Medications  sodium chloride 0.9 % bolus 1,000 mL (1,000 mLs Intravenous New Bag/Given 06/22/13 2338)  ondansetron [facility]) injection 4 mg (4 mg Intravenous Given 06/22/13 2338)    Results for orders placed during the hospital encounter of 06/22/13  URINALYSIS, ROUTINE W REFLEX MICROSCOPIC      Result Value Ref Range   Color, Urine YELLOW  YELLOW   APPearance CLOUDY (*) CLEAR   Specific Gravity, Urine 1.017  1.005 - 1.030   pH 5.0  5.0 - 8.0   Glucose, UA NEGATIVE  NEGATIVE mg/dL   Hgb urine dipstick NEGATIVE  NEGATIVE   Bilirubin Urine NEGATIVE  NEGATIVE   Ketones, ur NEGATIVE  NEGATIVE mg/dL   Protein, ur NEGATIVE  NEGATIVE mg/dL   Urobilinogen, UA 0.2  0.0 - 1.0 mg/dL   Nitrite NEGATIVE  NEGATIVE    Leukocytes, UA SMALL (*) NEGATIVE  URINE MICROSCOPIC-ADD ON      Result Value Ref Range   Squamous Epithelial / LPF FEW (*) RARE   WBC, UA 3-6  <3 WBC/hpf   RBC / HPF 0-2  <3 RBC/hpf   Bacteria, UA FEW (*) RARE   No results found.    EKG Interpretation None      MDM   Final diagnoses:  Nausea vomiting and diarrhea  Dehydration   I personally performed the services described in this documentation, which was scribed in my presence. The recorded information has been reviewed and is accurate.  Patient presents with vomiting, nausea and diarrhea since dinner. Patient currently has no abdominal pain, no fevers or chills. Patient gradually felt better throughout the evening and mild dehydration clinically. Blood work and urinalysis unremarkable. IV fluid bolus and nausea meds given. Patient tolerating oral fluids in ER. Patient well-appearing with no abdominal pain on recheck and reasons to return given. Mild lactic acid elevation. No concern for bowel ischemia this time.  Results and differential diagnosis were discussed with the patient/parent/guardian. Close follow up outpatient was discussed, comfortable with the plan.   Medications  sodium chloride 0.9 % bolus 1,000 mL (0 mLs Intravenous Stopped 06/23/13 0047)  ondansetron (ZOFRAN) injection 4 mg (4 mg Intravenous Given 06/22/13 2338)    Filed Vitals:   06/22/13 2120 06/23/13 0048  BP: 170/65 142/60  Pulse: 64 64  Temp: 98.4 F (36.9 C) 98 F (36.7 C)  TempSrc: Oral Oral  Resp: 18 16  Height: 4\' 11"  (1.499 m)   Weight: 118 lb (53.524 kg)   SpO2: 98% 96%      Mariea Clonts, MD 06/23/13 0222

## 2013-06-22 NOTE — ED Notes (Signed)
MD at bedside. 

## 2013-06-22 NOTE — ED Notes (Signed)
Vomiting and diarrhea since eating dinner. Denies pain.

## 2013-06-23 LAB — COMPREHENSIVE METABOLIC PANEL
ALK PHOS: 23 U/L — AB (ref 39–117)
ALT: 14 U/L (ref 0–35)
AST: 20 U/L (ref 0–37)
Albumin: 3.9 g/dL (ref 3.5–5.2)
BILIRUBIN TOTAL: 0.2 mg/dL — AB (ref 0.3–1.2)
BUN: 14 mg/dL (ref 6–23)
CHLORIDE: 103 meq/L (ref 96–112)
CO2: 24 mEq/L (ref 19–32)
Calcium: 9.7 mg/dL (ref 8.4–10.5)
Creatinine, Ser: 1 mg/dL (ref 0.50–1.10)
GFR calc Af Amer: 57 mL/min — ABNORMAL LOW (ref >90)
GFR, EST NON AFRICAN AMERICAN: 49 mL/min — AB (ref >90)
Glucose, Bld: 166 mg/dL — ABNORMAL HIGH (ref 70–99)
POTASSIUM: 4.5 meq/L (ref 3.7–5.3)
Sodium: 142 mEq/L (ref 137–147)
Total Protein: 7.8 g/dL (ref 6.0–8.3)

## 2013-06-23 LAB — LIPASE, BLOOD: Lipase: 22 U/L (ref 11–59)

## 2013-06-23 MED ORDER — ONDANSETRON 4 MG PO TBDP: ORAL_TABLET | ORAL | Status: DC

## 2013-06-23 NOTE — Discharge Instructions (Signed)
If you were given medicines take as directed.  If you are on coumadin or contraceptives realize their levels and effectiveness is altered by many different medicines.  If you have any reaction (rash, tongues swelling, other) to the medicines stop taking and see a physician.   Please follow up as directed and return to the ER or see a physician for new or worsening symptoms.  Thank you. Filed Vitals:   06/22/13 2120 06/23/13 0048  BP: 170/65 142/60  Pulse: 64 64  Temp: 98.4 F (36.9 C) 98 F (36.7 C)  TempSrc: Oral Oral  Resp: 18 16  Height: 4\' 11"  (1.499 m)   Weight: 118 lb (53.524 kg)   SpO2: 98% 96%

## 2013-06-23 NOTE — ED Notes (Signed)
MD at bedside. 

## 2013-06-23 NOTE — ED Notes (Signed)
rx x 1 given for zofran- pt d/c home with family

## 2013-06-23 NOTE — ED Notes (Signed)
Pt given water for fluid challenge per Dr. Reather Converse

## 2013-06-30 ENCOUNTER — Telehealth: Payer: Self-pay | Admitting: Family Medicine

## 2013-06-30 MED ORDER — ATORVASTATIN CALCIUM 10 MG PO TABS: 10.0000 mg | ORAL_TABLET | Freq: Every day | ORAL | Status: DC

## 2013-06-30 NOTE — Telephone Encounter (Signed)
Refill- atorvastatin  [facility] [address]

## 2013-07-10 ENCOUNTER — Ambulatory Visit (INDEPENDENT_AMBULATORY_CARE_PROVIDER_SITE_OTHER): Payer: Medicare Other

## 2013-07-10 DIAGNOSIS — I4891 Unspecified atrial fibrillation: Secondary | ICD-10-CM

## 2013-07-10 DIAGNOSIS — Z5181 Encounter for therapeutic drug level monitoring: Secondary | ICD-10-CM

## 2013-07-10 DIAGNOSIS — Z7901 Long term (current) use of anticoagulants: Secondary | ICD-10-CM

## 2013-07-10 LAB — POCT INR: INR: 2.9

## 2013-07-29 ENCOUNTER — Telehealth: Payer: Self-pay | Admitting: Family Medicine

## 2013-07-29 NOTE — Telephone Encounter (Signed)
Requesting refill on Fluticasone and atorvastatin, [facility] [address] [facility] [place] Ph [phone] Fax [phone]

## 2013-07-29 NOTE — Telephone Encounter (Signed)
Medication Detail      Disp Refills Start End     atorvastatin (LIPITOR) 10 MG tablet 30 tablet 2 06/30/2013     Sig - Route: Take 1 tablet (10 mg total) by mouth daily at 6 PM. - Oral    E-Prescribing Status: Receipt confirmed by pharmacy (06/30/2013 3:52 PM EDT)    Medication Detail      Disp Refills Start End     fluticasone (FLONASE) 50 MCG/ACT nasal spray 16 g 6 01/29/2013     Sig - Route: Place 2 sprays into both nostrils daily. - Each Nare    E-Prescribing Status: Receipt confirmed by pharmacy (01/29/2013 11:57 AM EST)    Denied; both Rx are not due for refill until the end of August 2015/SLS

## 2013-08-07 ENCOUNTER — Ambulatory Visit (INDEPENDENT_AMBULATORY_CARE_PROVIDER_SITE_OTHER): Payer: Medicare Other | Admitting: Pharmacist

## 2013-08-07 DIAGNOSIS — Z7901 Long term (current) use of anticoagulants: Secondary | ICD-10-CM

## 2013-08-07 DIAGNOSIS — I4891 Unspecified atrial fibrillation: Secondary | ICD-10-CM

## 2013-08-07 DIAGNOSIS — Z5181 Encounter for therapeutic drug level monitoring: Secondary | ICD-10-CM

## 2013-08-07 LAB — POCT INR: INR: 2.6

## 2013-08-17 ENCOUNTER — Telehealth: Payer: Self-pay | Admitting: Family Medicine

## 2013-08-17 MED ORDER — ALPRAZOLAM 0.25 MG PO TABS: 0.2500 mg | ORAL_TABLET | Freq: Three times a day (TID) | ORAL | Status: DC | PRN

## 2013-08-17 NOTE — Telephone Encounter (Signed)
Last RX done on 06-16-13 quantity 90 with 1 refill  RX printed for md to sign and fax

## 2013-08-17 NOTE — Telephone Encounter (Signed)
Pt requesting refill on alprazolam 0.25mg , please call into [facility]

## 2013-08-21 ENCOUNTER — Ambulatory Visit: Payer: Medicare Other | Admitting: Family Medicine

## 2013-08-24 ENCOUNTER — Telehealth: Payer: Self-pay | Admitting: Family Medicine

## 2013-08-24 ENCOUNTER — Encounter: Payer: Self-pay | Admitting: Family Medicine

## 2013-08-24 ENCOUNTER — Ambulatory Visit (INDEPENDENT_AMBULATORY_CARE_PROVIDER_SITE_OTHER): Payer: Medicare Other | Admitting: Family Medicine

## 2013-08-24 VITALS — BP 122/64 | HR 65 | Temp 98.1°F | Ht 60.0 in | Wt 119.1 lb

## 2013-08-24 DIAGNOSIS — K219 Gastro-esophageal reflux disease without esophagitis: Secondary | ICD-10-CM

## 2013-08-24 DIAGNOSIS — R7309 Other abnormal glucose: Secondary | ICD-10-CM

## 2013-08-24 DIAGNOSIS — D649 Anemia, unspecified: Secondary | ICD-10-CM

## 2013-08-24 DIAGNOSIS — E785 Hyperlipidemia, unspecified: Secondary | ICD-10-CM

## 2013-08-24 DIAGNOSIS — R739 Hyperglycemia, unspecified: Secondary | ICD-10-CM

## 2013-08-24 DIAGNOSIS — Z23 Encounter for immunization: Secondary | ICD-10-CM

## 2013-08-24 DIAGNOSIS — I4891 Unspecified atrial fibrillation: Secondary | ICD-10-CM

## 2013-08-24 DIAGNOSIS — I10 Essential (primary) hypertension: Secondary | ICD-10-CM

## 2013-08-24 LAB — RENAL FUNCTION PANEL
Albumin: 4.3 g/dL (ref 3.5–5.2)
BUN: 15 mg/dL (ref 6–23)
CHLORIDE: 106 meq/L (ref 96–112)
CO2: 29 mEq/L (ref 19–32)
Calcium: 9.2 mg/dL (ref 8.4–10.5)
Creat: 1.15 mg/dL — ABNORMAL HIGH (ref 0.50–1.10)
Glucose, Bld: 88 mg/dL (ref 70–99)
POTASSIUM: 4.4 meq/L (ref 3.5–5.3)
Phosphorus: 4.4 mg/dL (ref 2.3–4.6)
SODIUM: 144 meq/L (ref 135–145)

## 2013-08-24 LAB — CBC
HEMATOCRIT: 34.9 % — AB (ref 36.0–46.0)
Hemoglobin: 12 g/dL (ref 12.0–15.0)
MCH: 30.5 pg (ref 26.0–34.0)
MCHC: 34.4 g/dL (ref 30.0–36.0)
MCV: 88.6 fL (ref 78.0–100.0)
PLATELETS: 219 10*3/uL (ref 150–400)
RBC: 3.94 MIL/uL (ref 3.87–5.11)
RDW: 15.2 % (ref 11.5–15.5)
WBC: 5.5 10*3/uL (ref 4.0–10.5)

## 2013-08-24 NOTE — Assessment & Plan Note (Signed)
Tolerating statin, encouraged heart healthy diet, avoid trans fats, minimize simple carbs and saturated fats. Increase exercise as tolerated 

## 2013-08-24 NOTE — Assessment & Plan Note (Signed)
Rate controlled, tolerating coumadin 

## 2013-08-24 NOTE — Assessment & Plan Note (Signed)
Well controlled, no changes to meds. Encouraged heart healthy diet such as the DASH diet and exercise as tolerated.  °

## 2013-08-24 NOTE — Patient Instructions (Signed)

## 2013-08-24 NOTE — Telephone Encounter (Signed)
Relevant patient education mailed to patient.  

## 2013-08-24 NOTE — Progress Notes (Signed)
Pre visit review using our clinic review tool, if applicable. No additional management support is needed unless otherwise documented below in the visit note. 

## 2013-08-24 NOTE — Assessment & Plan Note (Signed)
Repeat CBC today 

## 2013-08-24 NOTE — Assessment & Plan Note (Signed)
Glucose elevated mildly with acute illness will recheck today

## 2013-08-24 NOTE — Assessment & Plan Note (Signed)
Avoid offending foods, start probiotics. Do not eat large meals in late evening and consider raising head of bed.  

## 2013-08-24 NOTE — Progress Notes (Signed)
Patient ID: [name] [name], female   DOB: April 22, 1924, 80 y.o.   MRN: [id number] [name] [name] [id number] 1924/09/03 08/24/2013      Progress Note-Follow Up  Subjective  Chief Complaint  Chief Complaint  Patient presents with  . Follow-up    3 month  . Injections    flu    HPI  Patient is a 80 year old female in today for routine medical care. She is here today for followup. Feels well. No recent hospitalization although she did have an trip to the emergency room back in June for gastroenteritis with nausea vomiting and diarrhea. Although symptoms resolved after treatment in the ER and have not returned. Today she feels well and offers no acute complaints. Denies CP/palp/SOB/HA/congestion/fevers/GI or GU c/o. Taking meds as prescribed  Past Medical History  Diagnosis Date  . Paroxysmal a-fib   . Mitral and aortic regurgitation   . Mitral valve prolapse     hx of  . Hypertension   . Hyperlipidemia   . Vitamin D deficiency   . Pneumonia   . Tracheitis   . Gait difficulty   . Anemia   . Melena   . IBS (irritable bowel syndrome)   . Incontinence   . GERD (gastroesophageal reflux disease)   . Osteopenia   . Thyroid disease     hypo  . Personal history of colonic polyps   . Current use of long term anticoagulation   . Cough   . Diarrhea   . Pneumonia     organism unspecified  . Constipation   . UTI (lower urinary tract infection)   . Unspecified adverse effect of other drug, medicinal and biological substance(995.29)   . History of mammogram 2009  . Diverticulosis   . GERD (gastroesophageal reflux disease)   . Hyperlipidemia   . Atrial fibrillation   . Arthritis     hips, knees  . Headache(784.0) 06/22/2012  . Renal insufficiency 06/28/2012  . Hyperkalemia 06/28/2012    Past Surgical History  Procedure Laterality Date  . Breast biopsy Left   . Cardiac electrophysiology mapping and ablation    . Abdominal hysterectomy      partial, ovaries left in place  .  Cataract extraction, bilateral    . Esophagoscopy w/ botox injection    . Esophagogastroduodenoscopy N/A 09/03/2012    Procedure: ESOPHAGOGASTRODUODENOSCOPY (EGD);  Surgeon: Inda Castle, MD;  Location: [facility];  Service: Endoscopy;  Laterality: N/A;  . Botox injection N/A 09/03/2012    Procedure: BOTOX INJECTION;  Surgeon: Inda Castle, MD;  Location: [facility];  Service: Endoscopy;  Laterality: N/A;  . I&d extremity Right 11/06/2012    Procedure: IRRIGATION AND DEBRIDEMENT EXTREMITY Right Ring Finger;  Surgeon: Tennis Must, MD;  Location: [place];  Service: Orthopedics;  Laterality: Right;    Family History  Problem Relation Age of Onset  . Diabetes Mother   . CVA Mother   . COPD Father   . Heart disease Maternal Aunt   . Heart disease Maternal Uncle   . Allergies Daughter   . COPD Daughter   . Stroke Daughter   . COPD Daughter     previous smoker  . Stroke Maternal Grandfather     History   Social History  . Marital Status: Widowed    Spouse Name: N/A    Number of Children: 3  . Years of Education: N/A   Occupational History  .     Social History Main Topics  .  Smoking status: Never Smoker   . Smokeless tobacco: Never Used  . Alcohol Use: No  . Drug Use: No  . Sexual Activity: No   Other Topics Concern  . Not on file   Social History Narrative  . No narrative on file    Current Outpatient Prescriptions on File Prior to Visit  Medication Sig Dispense Refill  . ALPRAZolam (XANAX) 0.25 MG tablet Take 1 tablet (0.25 mg total) by mouth 3 (three) times daily as needed for anxiety.  90 tablet  1  . atorvastatin (LIPITOR) 10 MG tablet Take 1 tablet (10 mg total) by mouth daily at 6 PM.  30 tablet  2  . Cholecalciferol (VITAMIN D) 2000 UNITS CAPS Take 2,000 Units by mouth every other day.      . fluticasone (FLONASE) 50 MCG/ACT nasal spray Place 2 sprays into both nostrils daily.  16 g  6  . metoprolol tartrate (LOPRESSOR) 25 MG tablet Take 0.5 tablets (12.5  mg total) by mouth 2 (two) times daily. bid  90 tablet  3  . NEXIUM 40 MG capsule TAKE ONE CAPSULE IN THE MORNING AND 1 IN THE EVENING FOR REFLUX AND COUGH  180 capsule  1  . ondansetron (ZOFRAN ODT) 4 MG disintegrating tablet 4mg  ODT q4 hours prn nausea/vomit  4 tablet  0  . ramipril (ALTACE) 10 MG capsule Take 1 capsule (10 mg total) by mouth daily.  90 capsule  3  . vitamin B-12 (CYANOCOBALAMIN) 1000 MCG tablet Take 1 tablet (1,000 mcg total) by mouth daily.      Marland Kitchen warfarin (COUMADIN) 2.5 MG tablet Take as directed by Anticoagulation clinic  40 tablet  3   No current facility-administered medications on file prior to visit.    Allergies  Allergen Reactions  . Darifenacin Hydrobromide     REACTION: causes her dizziness  . Sulfonamide Derivatives   . Tramadol Other (See Comments)    Insomnia, anorexia    Review of Systems  Review of Systems  Constitutional: Negative for fever and malaise/fatigue.  HENT: Negative for congestion.   Eyes: Negative for discharge.  Respiratory: Positive for cough. Negative for shortness of breath.        Slight infrequent, nonproductive  Cardiovascular: Negative for chest pain, palpitations and leg swelling.  Gastrointestinal: Negative for nausea, abdominal pain and diarrhea.  Genitourinary: Negative for dysuria.  Musculoskeletal: Negative for falls.  Skin: Negative for rash.  Neurological: Negative for loss of consciousness and headaches.  Endo/Heme/Allergies: Negative for polydipsia.  Psychiatric/Behavioral: Negative for depression and suicidal ideas. The patient is not nervous/anxious and does not have insomnia.     Objective  BP 122/64  Pulse 65  Temp(Src) 98.1 F (36.7 C) (Oral)  Ht 5' (1.524 m)  Wt 119 lb 1.3 oz (54.014 kg)  BMI 23.26 kg/m2  SpO2 97%  Physical Exam  Physical Exam  Constitutional: She is oriented to person, place, and time and well-developed, well-nourished, and in no distress. No distress.  HENT:  Head:  Normocephalic and atraumatic.  Eyes: Conjunctivae are normal.  Neck: Neck supple. No thyromegaly present.  Cardiovascular: Normal rate and normal heart sounds.   No murmur heard. irregular  Pulmonary/Chest: Effort normal and breath sounds normal. She has no wheezes.  Abdominal: She exhibits no distension and no mass.  Musculoskeletal: She exhibits no edema.  Lymphadenopathy:    She has no cervical adenopathy.  Neurological: She is alert and oriented to person, place, and time.  Skin: Skin is warm and  dry. No rash noted. She is not diaphoretic.  Psychiatric: Memory, affect and judgment normal.    Lab Results  Component Value Date   TSH 2.757 05/08/2013   Lab Results  Component Value Date   WBC 12.6* 06/22/2013   HGB 13.3 06/22/2013   HCT 39.4 06/22/2013   MCV 91.2 06/22/2013   PLT 192 06/22/2013   Lab Results  Component Value Date   CREATININE 1.00 06/22/2013   BUN 14 06/22/2013   NA 142 06/22/2013   K 4.5 06/22/2013   CL 103 06/22/2013   CO2 24 06/22/2013   Lab Results  Component Value Date   ALT 14 06/22/2013   AST 20 06/22/2013   ALKPHOS 23* 06/22/2013   BILITOT 0.2* 06/22/2013   Lab Results  Component Value Date   CHOL 131 05/08/2013   Lab Results  Component Value Date   HDL 36* 05/08/2013   Lab Results  Component Value Date   LDLCALC 65 05/08/2013   Lab Results  Component Value Date   TRIG 151* 05/08/2013   Lab Results  Component Value Date   CHOLHDL 3.6 05/08/2013     Assessment & Plan  ANEMIA Repeat CBC today  GERD Avoid offending foods, start probiotics. Do not eat large meals in late evening and consider raising head of bed.   HYPERTENSION Well controlled, no changes to meds. Encouraged heart healthy diet such as the DASH diet and exercise as tolerated.   HYPERLIPIDEMIA Tolerating statin, encouraged heart healthy diet, avoid trans fats, minimize simple carbs and saturated fats. Increase exercise as tolerated  PAROXYSMAL ATRIAL FIBRILLATION Rate  controlled, tolerating coumadin  Hyperglycemia Glucose elevated mildly with acute illness will recheck today

## 2013-08-30 ENCOUNTER — Emergency Department (HOSPITAL_BASED_OUTPATIENT_CLINIC_OR_DEPARTMENT_OTHER): Payer: Medicare Other

## 2013-08-30 ENCOUNTER — Encounter (HOSPITAL_BASED_OUTPATIENT_CLINIC_OR_DEPARTMENT_OTHER): Payer: Self-pay | Admitting: Emergency Medicine

## 2013-08-30 ENCOUNTER — Emergency Department (HOSPITAL_BASED_OUTPATIENT_CLINIC_OR_DEPARTMENT_OTHER)
Admission: EM | Admit: 2013-08-30 | Discharge: 2013-08-30 | Disposition: A | Discharge status: Home / Self Care | Payer: Medicare Other | Attending: Emergency Medicine | Admitting: Emergency Medicine

## 2013-08-30 DIAGNOSIS — Z87448 Personal history of other diseases of urinary system: Secondary | ICD-10-CM | POA: Insufficient documentation

## 2013-08-30 DIAGNOSIS — J189 Pneumonia, unspecified organism: Secondary | ICD-10-CM

## 2013-08-30 DIAGNOSIS — R05 Cough: Secondary | ICD-10-CM | POA: Insufficient documentation

## 2013-08-30 DIAGNOSIS — K219 Gastro-esophageal reflux disease without esophagitis: Secondary | ICD-10-CM | POA: Diagnosis not present

## 2013-08-30 DIAGNOSIS — I10 Essential (primary) hypertension: Secondary | ICD-10-CM | POA: Diagnosis not present

## 2013-08-30 DIAGNOSIS — Z87440 Personal history of urinary (tract) infections: Secondary | ICD-10-CM | POA: Insufficient documentation

## 2013-08-30 DIAGNOSIS — Z862 Personal history of diseases of the blood and blood-forming organs and certain disorders involving the immune mechanism: Secondary | ICD-10-CM | POA: Insufficient documentation

## 2013-08-30 DIAGNOSIS — Z79899 Other long term (current) drug therapy: Secondary | ICD-10-CM | POA: Diagnosis not present

## 2013-08-30 DIAGNOSIS — Z8739 Personal history of other diseases of the musculoskeletal system and connective tissue: Secondary | ICD-10-CM | POA: Diagnosis not present

## 2013-08-30 DIAGNOSIS — I4891 Unspecified atrial fibrillation: Secondary | ICD-10-CM | POA: Insufficient documentation

## 2013-08-30 DIAGNOSIS — Z860100 Personal history of colon polyps, unspecified: Secondary | ICD-10-CM | POA: Insufficient documentation

## 2013-08-30 DIAGNOSIS — J159 Unspecified bacterial pneumonia: Secondary | ICD-10-CM | POA: Diagnosis not present

## 2013-08-30 DIAGNOSIS — Z7901 Long term (current) use of anticoagulants: Secondary | ICD-10-CM | POA: Diagnosis not present

## 2013-08-30 DIAGNOSIS — R059 Cough, unspecified: Secondary | ICD-10-CM | POA: Diagnosis present

## 2013-08-30 DIAGNOSIS — E785 Hyperlipidemia, unspecified: Secondary | ICD-10-CM | POA: Insufficient documentation

## 2013-08-30 MED ORDER — ALBUTEROL SULFATE HFA 108 (90 BASE) MCG/ACT IN AERS: 1.0000 | INHALATION_SPRAY | Freq: Four times a day (QID) | RESPIRATORY_TRACT | Status: DC | PRN

## 2013-08-30 MED ORDER — LEVOFLOXACIN 500 MG PO TABS: 500.0000 mg | ORAL_TABLET | Freq: Every day | ORAL | Status: DC

## 2013-08-30 MED ORDER — BENZONATATE 100 MG PO CAPS: 100.0000 mg | ORAL_CAPSULE | Freq: Three times a day (TID) | ORAL | Status: DC

## 2013-08-30 MED ORDER — LEVOFLOXACIN 500 MG PO TABS
500.0000 mg | ORAL_TABLET | Freq: Once | ORAL | Status: AC
  Administered 2013-08-30: 500 mg via ORAL
  Filled 2013-08-30: qty 1

## 2013-08-30 MED ORDER — PREDNISONE 20 MG PO TABS
40.0000 mg | ORAL_TABLET | Freq: Once | ORAL | Status: AC
  Administered 2013-08-30: 40 mg via ORAL
  Filled 2013-08-30: qty 2

## 2013-08-30 MED ORDER — IPRATROPIUM-ALBUTEROL 0.5-2.5 (3) MG/3ML IN SOLN
3.0000 mL | RESPIRATORY_TRACT | Status: DC
  Administered 2013-08-30: 3 mL via RESPIRATORY_TRACT
  Filled 2013-08-30: qty 39

## 2013-08-30 MED ORDER — PREDNISONE 10 MG PO TABS: 20.0000 mg | ORAL_TABLET | Freq: Every day | ORAL | Status: DC

## 2013-08-30 NOTE — ED Provider Notes (Signed)
CSN: [id number]     Arrival date & time 08/30/13  0801 History   First MD Initiated Contact with Patient 08/30/13 [phone]     Chief Complaint  Patient presents with  . Cough      HPI  Patient presents with her daughter. Complain of a "cold" now developing worsening cough for the last few days. Coughing and sore under both ribs the last 2 days. No history of COPD or lung disease per the patient's report. She is a lifetime nonsmoker. No history of CHF. Does have a history of paroxysmal A. fib. No fevers or chills. Cough productive of yellow sputum. No hemoptysis. No pleuritic discomfort. Only pain with cough. No Anginal type pain or pressure. No dependent edema. No unilateral leg swelling. No neck back or jaw pain  Past Medical History  Diagnosis Date  . Paroxysmal a-fib   . Mitral and aortic regurgitation   . Mitral valve prolapse     hx of  . Hypertension   . Hyperlipidemia   . Vitamin D deficiency   . Pneumonia   . Tracheitis   . Gait difficulty   . Anemia   . Melena   . IBS (irritable bowel syndrome)   . Incontinence   . GERD (gastroesophageal reflux disease)   . Osteopenia   . Thyroid disease     hypo  . Personal history of colonic polyps   . Current use of long term anticoagulation   . Cough   . Diarrhea   . Pneumonia     organism unspecified  . Constipation   . UTI (lower urinary tract infection)   . Unspecified adverse effect of other drug, medicinal and biological substance(995.29)   . History of mammogram 2009  . Diverticulosis   . GERD (gastroesophageal reflux disease)   . Hyperlipidemia   . Atrial fibrillation   . Arthritis     hips, knees  . Headache(784.0) 06/22/2012  . Renal insufficiency 06/28/2012  . Hyperkalemia 06/28/2012   Past Surgical History  Procedure Laterality Date  . Breast biopsy Left   . Cardiac electrophysiology mapping and ablation    . Abdominal hysterectomy      partial, ovaries left in place  . Cataract extraction, bilateral    .  Esophagoscopy w/ botox injection    . Esophagogastroduodenoscopy N/A 09/03/2012    Procedure: ESOPHAGOGASTRODUODENOSCOPY (EGD);  Surgeon: Inda Castle, MD;  Location: [facility];  Service: Endoscopy;  Laterality: N/A;  . Botox injection N/A 09/03/2012    Procedure: BOTOX INJECTION;  Surgeon: Inda Castle, MD;  Location: [facility];  Service: Endoscopy;  Laterality: N/A;  . I&d extremity Right 11/06/2012    Procedure: IRRIGATION AND DEBRIDEMENT EXTREMITY Right Ring Finger;  Surgeon: Tennis Must, MD;  Location: [place];  Service: Orthopedics;  Laterality: Right;   Family History  Problem Relation Age of Onset  . Diabetes Mother   . CVA Mother   . COPD Father   . Heart disease Maternal Aunt   . Heart disease Maternal Uncle   . Allergies Daughter   . COPD Daughter   . Stroke Daughter   . COPD Daughter     previous smoker  . Stroke Maternal Grandfather    History  Substance Use Topics  . Smoking status: Never Smoker   . Smokeless tobacco: Never Used  . Alcohol Use: No   OB History   Grav Para Term Preterm Abortions TAB SAB Ect Mult Living  Review of Systems  Constitutional: Negative for fever, chills, diaphoresis, appetite change and fatigue.  HENT: Negative for mouth sores, sore throat and trouble swallowing.   Eyes: Negative for visual disturbance.  Respiratory: Positive for cough. Negative for chest tightness, shortness of breath and wheezing.   Cardiovascular: Negative for chest pain.  Gastrointestinal: Negative for nausea, vomiting, abdominal pain, diarrhea and abdominal distention.  Endocrine: Negative for polydipsia, polyphagia and polyuria.  Genitourinary: Negative for dysuria, frequency and hematuria.  Musculoskeletal: Negative for gait problem.  Skin: Negative for color change, pallor and rash.  Neurological: Negative for dizziness, syncope, light-headedness and headaches.  Hematological: Does not bruise/bleed easily.  Psychiatric/Behavioral:  Negative for behavioral problems and confusion.      Allergies  Darifenacin hydrobromide; Sulfonamide derivatives; and Tramadol  Home Medications   Prior to Admission medications   Medication Sig Start Date End Date Taking? Authorizing Provider  albuterol (PROVENTIL HFA;VENTOLIN HFA) 108 (90 BASE) MCG/ACT inhaler Inhale 1-2 puffs into the lungs every 6 (six) hours as needed for wheezing. 08/30/13   Tanna Furry, MD  ALPRAZolam Duanne Moron) 0.25 MG tablet Take 1 tablet (0.25 mg total) by mouth 3 (three) times daily as needed for anxiety. 08/17/13   Mosie Lukes, MD  atorvastatin (LIPITOR) 10 MG tablet Take 1 tablet (10 mg total) by mouth daily at 6 PM. 06/30/13   Mosie Lukes, MD  benzonatate (TESSALON) 100 MG capsule Take 1 capsule (100 mg total) by mouth every 8 (eight) hours. 08/30/13   Tanna Furry, MD  Cholecalciferol (VITAMIN D) 2000 UNITS CAPS Take 2,000 Units by mouth every other day. 02/20/12   Mosie Lukes, MD  fluticasone (FLONASE) 50 MCG/ACT nasal spray Place 2 sprays into both nostrils daily. 01/29/13   Brunetta Jeans, PA-C  levofloxacin (LEVAQUIN) 500 MG tablet Take 1 tablet (500 mg total) by mouth daily. 08/30/13   Tanna Furry, MD  metoprolol tartrate (LOPRESSOR) 25 MG tablet Take 0.5 tablets (12.5 mg total) by mouth 2 (two) times daily. bid 01/30/13   Mosie Lukes, MD  NEXIUM 40 MG capsule TAKE ONE CAPSULE IN THE MORNING AND 1 IN THE EVENING FOR REFLUX AND COUGH 03/25/13   Mosie Lukes, MD  ondansetron (ZOFRAN ODT) 4 MG disintegrating tablet 4mg  ODT q4 hours prn nausea/vomit 06/23/13   Mariea Clonts, MD  predniSONE (DELTASONE) 10 MG tablet Take 2 tablets (20 mg total) by mouth daily. 08/30/13   Tanna Furry, MD  ramipril (ALTACE) 10 MG capsule Take 1 capsule (10 mg total) by mouth daily. 01/30/13   Mosie Lukes, MD  vitamin B-12 (CYANOCOBALAMIN) 1000 MCG tablet Take 1 tablet (1,000 mcg total) by mouth daily. 02/20/12   Mosie Lukes, MD  warfarin (COUMADIN) 2.5 MG tablet Take as  directed by Anticoagulation clinic 05/15/13   Dorothy Spark, MD   BP 190/68  Pulse 82  Temp(Src) 98.9 F (37.2 C) (Oral)  Resp 18  Ht 5' (1.524 m)  Wt 119 lb (53.978 kg)  BMI 23.24 kg/m2  SpO2 96% Physical Exam  Constitutional: She is oriented to person, place, and time. She appears well-developed and well-nourished. No distress.  Skin is somewhat frail but otherwise healthy appearing female. Awake alert oriented lucid.  HENT:  No nasal congestion. Pharynx benign.  Eyes: Conjunctivae are normal. No scleral icterus.  Neck: Normal range of motion. Neck supple. No thyromegaly present.  Cardiovascular: Normal rate and regular rhythm.  Exam reveals no gallop and no friction rub.   No  murmur heard. No S3 or 4 gallop. Sinus rhythm. No dependent edema. No unilateral leg swelling.  Pulmonary/Chest:  Prolongation and wheezing in all fields. No focal diminished breath sounds crackles or rales.  Abdominal: Soft. Bowel sounds are normal. She exhibits no distension. There is no tenderness. There is no rebound.  Musculoskeletal: Normal range of motion.  Neurological: She is alert and oriented to person, place, and time.  Skin: Skin is warm and dry. No rash noted.  Psychiatric: She has a normal mood and affect. Her behavior is normal.    ED Course  Procedures (including critical care time) Labs Review Labs Reviewed - No data to display  Imaging Review Dg Chest 2 View  08/30/2013   CLINICAL DATA:  Several day history of cough and congestion  EXAM: CHEST  2 VIEW  COMPARISON:  Prior chest x-ray 05/08/2013  FINDINGS: Stable cardiac and mediastinal contours. Large hiatal hernia with air-fluid level. Atherosclerotic calcifications again noted throughout the transverse aorta. The lungs remain a markedly hyperexpanded with a background chronic bronchitic and emphysematous changes. Biapical pleural parenchymal scarring. Slightly increased interstitial prominence in the right upper compared to prior.  Otherwise, no focal airspace consolidation, pleural effusion or pneumothorax. No acute osseous abnormality.  IMPRESSION: 1. Slightly increased focal interstitial prominence in the right upper lung on the frontal view. This may reflect early developing infiltrate or atelectasis. Recommend imaging followup to resolution to exclude a developing neoplastic process. 2. Background of advanced emphysema/COPD. 3. Large hiatal hernia. 4. Aortic atherosclerosis.   Electronically Signed   By: Jacqulynn Cadet M.D.   On: 08/30/2013 08:29     EKG Interpretation None      MDM   Final diagnoses:  Community acquired pneumonia    Lungs clear his symptoms resolved exercise no nebulized albuterol. Saturation 97%. I've encouraged to take her blood pressure medicine at home. X-ray shows very subtle right midlung opacity. She was made aware of this. We'll treat for infection symptoms. I have asked her to recheck chest x-ray after completion of antibiotics. Follow with primary care to ensure resolution.    Tanna Furry, MD 08/30/13 1010

## 2013-08-30 NOTE — ED Notes (Signed)
Patient has a congested cough for about a week.

## 2013-08-30 NOTE — Discharge Instructions (Signed)
Pneumonia, Adult  Pneumonia is an infection of the lungs. It may be caused by a germ (virus or bacteria). Some types of pneumonia can spread easily from person to person. This can happen when you cough or sneeze.  HOME CARE   Only take medicine as told by your doctor.   Take your medicine (antibiotics) as told. Finish it even if you start to feel better.   Do not smoke.   You may use a vaporizer or humidifier in your room. This can help loosen thick spit (mucus).   Sleep so you are almost sitting up (semi-upright). This helps reduce coughing.   Rest.  A shot (vaccine) can help prevent pneumonia. Shots are often advised for:   People over 65 years old.   Patients on chemotherapy.   People with long-term (chronic) lung problems.   People with immune system problems.  GET HELP RIGHT AWAY IF:    You are getting worse.   You cannot control your cough, and you are losing sleep.   You cough up blood.   Your pain gets worse, even with medicine.   You have a fever.   Any of your problems are getting worse, not better.   You have shortness of breath or chest pain.  MAKE SURE YOU:    Understand these instructions.   Will watch your condition.   Will get help right away if you are not doing well or get worse.  Document Released: 06/06/2007 Document Revised: 03/12/2011 Document Reviewed: 03/10/2010  Exit[employer] Patient Information 2015 [employer]. This information is not intended to replace advice given to you by your health care provider. Make sure you discuss any questions you have with your health care provider.

## 2013-09-01 ENCOUNTER — Ambulatory Visit (INDEPENDENT_AMBULATORY_CARE_PROVIDER_SITE_OTHER): Payer: Medicare Other | Admitting: Pharmacist Clinician (PhC)/ Clinical Pharmacy Specialist

## 2013-09-01 ENCOUNTER — Telehealth: Payer: Self-pay

## 2013-09-01 DIAGNOSIS — I4891 Unspecified atrial fibrillation: Secondary | ICD-10-CM

## 2013-09-01 DIAGNOSIS — Z7901 Long term (current) use of anticoagulants: Secondary | ICD-10-CM

## 2013-09-01 DIAGNOSIS — Z5181 Encounter for therapeutic drug level monitoring: Secondary | ICD-10-CM

## 2013-09-01 LAB — POCT INR: INR: 3.8

## 2013-09-01 NOTE — Telephone Encounter (Signed)
I tried to call patient and left a message for her to return my call.  Lab order placed   Needs appt for lab and appt with md  Per md:  Will need an INR run non Weds or Thursday due to antibiotics given to her by ER, will then need appt with [place] in 6-8 weeks to assess resolution with xray. ----- Message ----- From: SYSTEM Sent: 08/30/2013 10:37 AM To: Mosie Lukes, MD

## 2013-09-02 NOTE — Telephone Encounter (Signed)
Pt scheduled appointment for 09/08/13 at 3.30pm with Doreatha Lew.

## 2013-09-02 NOTE — Telephone Encounter (Signed)
Front staff spoke to patient and is scheduling appts

## 2013-09-08 ENCOUNTER — Encounter: Payer: Self-pay | Admitting: Physician Assistant

## 2013-09-08 ENCOUNTER — Ambulatory Visit (INDEPENDENT_AMBULATORY_CARE_PROVIDER_SITE_OTHER): Payer: Medicare Other | Admitting: Physician Assistant

## 2013-09-08 VITALS — BP 133/77 | HR 87 | Temp 98.2°F | Wt 118.0 lb

## 2013-09-08 DIAGNOSIS — J189 Pneumonia, unspecified organism: Secondary | ICD-10-CM | POA: Insufficient documentation

## 2013-09-08 NOTE — Progress Notes (Signed)
Patient presents to clinic today for ER follow-up of Pneumonia. Patient was seen in ER on 08/30/13 for cough and chest congestion.  CXR revealed possible infiltrate of RUL. Patient started on Levaquin and course of Prednisone.  Instructed to follow-up with PCP for repeat CXR to see if infiltrate has resolved.  Some concern for neoplasm vs infiltrate.  Since ER discharge, patient endorses feeling very well.  Has been taking medications as directed.  Has one day left of the Levaquin. Denies cough, chest congestion, SOB.  Denies fever, chills, myalgias. Is currently on Coumadin which is affected by Levaquin.  Is followed by the [facility] and has appointment scheduled this Friday for PT/INR check. Regarding abnormal CXR findings, patient denies smoking history.  Endorses some second-hand smoke exposure.  Denies history of TB of hemoptysis.  Denies recent travel.  Past Medical History  Diagnosis Date  . Paroxysmal a-fib   . Mitral and aortic regurgitation   . Mitral valve prolapse     hx of  . Hypertension   . Hyperlipidemia   . Vitamin D deficiency   . Pneumonia   . Tracheitis   . Gait difficulty   . Anemia   . Melena   . IBS (irritable bowel syndrome)   . Incontinence   . GERD (gastroesophageal reflux disease)   . Osteopenia   . Thyroid disease     hypo  . Personal history of colonic polyps   . Current use of long term anticoagulation   . Cough   . Diarrhea   . Pneumonia     organism unspecified  . Constipation   . UTI (lower urinary tract infection)   . Unspecified adverse effect of other drug, medicinal and biological substance(995.29)   . History of mammogram 2009  . Diverticulosis   . GERD (gastroesophageal reflux disease)   . Hyperlipidemia   . Atrial fibrillation   . Arthritis     hips, knees  . Headache(784.0) 06/22/2012  . Renal insufficiency 06/28/2012  . Hyperkalemia 06/28/2012    Current Outpatient Prescriptions on File Prior to Visit  Medication Sig Dispense  Refill  . albuterol (PROVENTIL HFA;VENTOLIN HFA) 108 (90 BASE) MCG/ACT inhaler Inhale 1-2 puffs into the lungs every 6 (six) hours as needed for wheezing.  1 Inhaler  0  . ALPRAZolam (XANAX) 0.25 MG tablet Take 1 tablet (0.25 mg total) by mouth 3 (three) times daily as needed for anxiety.  90 tablet  1  . atorvastatin (LIPITOR) 10 MG tablet Take 1 tablet (10 mg total) by mouth daily at 6 PM.  30 tablet  2  . Cholecalciferol (VITAMIN D) 2000 UNITS CAPS Take 2,000 Units by mouth every other day.      . fluticasone (FLONASE) 50 MCG/ACT nasal spray Place 2 sprays into both nostrils daily.  16 g  6  . levofloxacin (LEVAQUIN) 500 MG tablet Take 1 tablet (500 mg total) by mouth daily.  10 tablet  0  . metoprolol tartrate (LOPRESSOR) 25 MG tablet Take 0.5 tablets (12.5 mg total) by mouth 2 (two) times daily. bid  90 tablet  3  . NEXIUM 40 MG capsule TAKE ONE CAPSULE IN THE MORNING AND 1 IN THE EVENING FOR REFLUX AND COUGH  180 capsule  1  . ramipril (ALTACE) 10 MG capsule Take 1 capsule (10 mg total) by mouth daily.  90 capsule  3  . vitamin B-12 (CYANOCOBALAMIN) 1000 MCG tablet Take 1 tablet (1,000 mcg total) by mouth daily.      Marland Kitchen  warfarin (COUMADIN) 2.5 MG tablet Take as directed by Anticoagulation clinic  40 tablet  3  . benzonatate (TESSALON) 100 MG capsule Take 1 capsule (100 mg total) by mouth every 8 (eight) hours.  21 capsule  0  . ondansetron (ZOFRAN ODT) 4 MG disintegrating tablet 50m ODT q4 hours prn nausea/vomit  4 tablet  0  . predniSONE (DELTASONE) 10 MG tablet Take 2 tablets (20 mg total) by mouth daily.  10 tablet  0   No current facility-administered medications on file prior to visit.    Allergies  Allergen Reactions  . Darifenacin Hydrobromide     REACTION: causes her dizziness  . Sulfonamide Derivatives   . Tramadol Other (See Comments)    Insomnia, anorexia    Family History  Problem Relation Age of Onset  . Diabetes Mother   . CVA Mother   . COPD Father   . Heart  disease Maternal Aunt   . Heart disease Maternal Uncle   . Allergies Daughter   . COPD Daughter   . Stroke Daughter   . COPD Daughter     previous smoker  . Stroke Maternal Grandfather     History   Social History  . Marital Status: Widowed    Spouse Name: N/A    Number of Children: 3  . Years of Education: N/A   Occupational History  .     Social History Main Topics  . Smoking status: Never Smoker   . Smokeless tobacco: Never Used  . Alcohol Use: No  . Drug Use: No  . Sexual Activity: No   Other Topics Concern  . None   Social History Narrative  . None    Review of Systems - See HPI.  All other ROS are negative.  BP 133/77  Pulse 87  Temp(Src) 98.2 F (36.8 C)  Wt 118 lb (53.524 kg)  SpO2 97%  Physical Exam  Vitals reviewed. Constitutional: She is oriented to person, place, and time and well-developed, well-nourished, and in no distress.  HENT:  Head: Normocephalic and atraumatic.  Right Ear: External ear normal.  Left Ear: External ear normal.  Nose: Nose normal.  Mouth/Throat: Oropharynx is clear and moist. No oropharyngeal exudate.  TM within normal limits bilaterally.  Eyes: Conjunctivae are normal.  Neck: Neck supple.  Cardiovascular: Normal rate, regular rhythm, normal heart sounds and intact distal pulses.   Pulmonary/Chest: Effort normal and breath sounds normal. No respiratory distress. She has no wheezes. She has no rales. She exhibits no tenderness.  Lymphadenopathy:    She has no cervical adenopathy.  Neurological: She is alert and oriented to person, place, and time.  Skin: Skin is warm and dry. No rash noted.  Psychiatric: Affect normal.    Recent Results (from the past 2160 hour(s))  POCT INR     Status: None   Collection Time    06/19/13 12:10 PM      Result Value Ref Range   INR 2.2    URINALYSIS, ROUTINE W REFLEX MICROSCOPIC     Status: Abnormal   Collection Time    06/22/13 11:21 PM      Result Value Ref Range   Color,  Urine YELLOW  YELLOW   APPearance CLOUDY (*) CLEAR   Specific Gravity, Urine 1.017  1.005 - 1.030   pH 5.0  5.0 - 8.0   Glucose, UA NEGATIVE  NEGATIVE mg/dL   Hgb urine dipstick NEGATIVE  NEGATIVE   Bilirubin Urine NEGATIVE  NEGATIVE  Ketones, ur NEGATIVE  NEGATIVE mg/dL   Protein, ur NEGATIVE  NEGATIVE mg/dL   Urobilinogen, UA 0.2  0.0 - 1.0 mg/dL   Nitrite NEGATIVE  NEGATIVE   Leukocytes, UA SMALL (*) NEGATIVE  URINE MICROSCOPIC-ADD ON     Status: Abnormal   Collection Time    06/22/13 11:21 PM      Result Value Ref Range   Squamous Epithelial / LPF FEW (*) RARE   WBC, UA 3-6  <3 WBC/hpf   RBC / HPF 0-2  <3 RBC/hpf   Bacteria, UA FEW (*) RARE  CBC WITH DIFFERENTIAL     Status: Abnormal   Collection Time    06/22/13 11:40 PM      Result Value Ref Range   WBC 12.6 (*) 4.0 - 10.5 K/uL   RBC 4.32  3.87 - 5.11 MIL/uL   Hemoglobin 13.3  12.0 - 15.0 g/dL   HCT 39.4  36.0 - 46.0 %   MCV 91.2  78.0 - 100.0 fL   MCH 30.8  26.0 - 34.0 pg   MCHC 33.8  30.0 - 36.0 g/dL   RDW 14.2  11.5 - 15.5 %   Platelets 192  150 - 400 K/uL   Neutrophils Relative % 85 (*) 43 - 77 %   Neutro Abs 10.7 (*) 1.7 - 7.7 K/uL   Lymphocytes Relative 9 (*) 12 - 46 %   Lymphs Abs 1.2  0.7 - 4.0 K/uL   Monocytes Relative 5  3 - 12 %   Monocytes Absolute 0.7  0.1 - 1.0 K/uL   Eosinophils Relative 0  0 - 5 %   Eosinophils Absolute 0.0  0.0 - 0.7 K/uL   Basophils Relative 0  0 - 1 %   Basophils Absolute 0.0  0.0 - 0.1 K/uL  COMPREHENSIVE METABOLIC PANEL     Status: Abnormal   Collection Time    06/22/13 11:40 PM      Result Value Ref Range   Sodium 142  137 - 147 mEq/L   Potassium 4.5  3.7 - 5.3 mEq/L   Chloride 103  96 - 112 mEq/L   CO2 24  19 - 32 mEq/L   Glucose, Bld 166 (*) 70 - 99 mg/dL   BUN 14  6 - 23 mg/dL   Creatinine, Ser 1.00  0.50 - 1.10 mg/dL   Calcium 9.7  8.4 - 10.5 mg/dL   Total Protein 7.8  6.0 - 8.3 g/dL   Albumin 3.9  3.5 - 5.2 g/dL   AST 20  0 - 37 U/L   ALT 14  0 - 35 U/L    Alkaline Phosphatase 23 (*) 39 - 117 U/L   Total Bilirubin 0.2 (*) 0.3 - 1.2 mg/dL   GFR calc non Af Amer 49 (*) >90 mL/min   GFR calc Af Amer 57 (*) >90 mL/min   Comment: (NOTE)     The eGFR has been calculated using the CKD EPI equation.     This calculation has not been validated in all clinical situations.     eGFR's persistently <90 mL/min signify possible Chronic Kidney     Disease.  LIPASE, BLOOD     Status: None   Collection Time    06/22/13 11:40 PM      Result Value Ref Range   Lipase 22  11 - 59 U/L  I-STAT CG4 LACTIC ACID, ED     Status: Abnormal   Collection Time    06/22/13 11:47 PM  Result Value Ref Range   Lactic Acid, Venous 2.46 (*) 0.5 - 2.2 mmol/L  POCT INR     Status: None   Collection Time    07/10/13 11:44 AM      Result Value Ref Range   INR 2.9    POCT INR     Status: None   Collection Time    08/07/13 11:48 AM      Result Value Ref Range   INR 2.6    CBC     Status: Abnormal   Collection Time    08/24/13 10:49 AM      Result Value Ref Range   WBC 5.5  4.0 - 10.5 K/uL   RBC 3.94  3.87 - 5.11 MIL/uL   Hemoglobin 12.0  12.0 - 15.0 g/dL   HCT 34.9 (*) 36.0 - 46.0 %   MCV 88.6  78.0 - 100.0 fL   MCH 30.5  26.0 - 34.0 pg   MCHC 34.4  30.0 - 36.0 g/dL   RDW 15.2  11.5 - 15.5 %   Platelets 219  150 - 400 K/uL  RENAL FUNCTION PANEL     Status: Abnormal   Collection Time    08/24/13 10:49 AM      Result Value Ref Range   Sodium 144  135 - 145 mEq/L   Potassium 4.4  3.5 - 5.3 mEq/L   Chloride 106  96 - 112 mEq/L   CO2 29  19 - 32 mEq/L   Glucose, Bld 88  70 - 99 mg/dL   BUN 15  6 - 23 mg/dL   Creat 1.15 (*) 0.50 - 1.10 mg/dL   Albumin 4.3  3.5 - 5.2 g/dL   Calcium 9.2  8.4 - 10.5 mg/dL   Phosphorus 4.4  2.3 - 4.6 mg/dL  POCT INR     Status: None   Collection Time    09/01/13 11:25 AM      Result Value Ref Range   INR 3.8      Assessment/Plan: CAP (community acquired pneumonia) Patient responding well to Levaquin.  Has 1 day left of  antibiotic.  Instructed patient to take antibiotic as directed.  Stay well hydrated.  Continue with rest.  Will repeat CXR in 3 weeks to ensure resolution of infiltrate and to verify that CXR finding was CAP and not due to potential neoplasm.

## 2013-09-08 NOTE — Patient Instructions (Signed)
Please finish your antibiotic.  Stay well hydrated.  Follow-up with the Coumadin clinic this Friday as scheduled.  We will repeat a CXR 3 weeks from now. I will call you to remind you of this.  Unfortunately we have to wait a couple of weeks to recheck your x-ray because if we check it to soon there wouldn't have been time for the "pneumonia" to clear up on imaging.  I am so glad you are feeling better.  Follow-up with Dr. Charlett Blake as scheduled.

## 2013-09-08 NOTE — Assessment & Plan Note (Signed)
Patient responding well to [place].  Has 1 day left of antibiotic.  Instructed patient to take antibiotic as directed.  Stay well hydrated.  Continue with rest.  Will repeat CXR in 3 weeks to ensure resolution of infiltrate and to verify that CXR finding was CAP and not due to potential neoplasm.

## 2013-09-08 NOTE — Progress Notes (Signed)
Pre visit review using our clinic review tool, if applicable. No additional management support is needed unless otherwise documented below in the visit note. 

## 2013-09-11 ENCOUNTER — Ambulatory Visit (INDEPENDENT_AMBULATORY_CARE_PROVIDER_SITE_OTHER): Payer: Medicare Other | Admitting: *Deleted

## 2013-09-11 DIAGNOSIS — Z7901 Long term (current) use of anticoagulants: Secondary | ICD-10-CM

## 2013-09-11 DIAGNOSIS — I4891 Unspecified atrial fibrillation: Secondary | ICD-10-CM

## 2013-09-11 DIAGNOSIS — Z5181 Encounter for therapeutic drug level monitoring: Secondary | ICD-10-CM

## 2013-09-11 LAB — POCT INR: INR: 3

## 2013-09-12 ENCOUNTER — Other Ambulatory Visit: Payer: Self-pay | Admitting: Physician Assistant

## 2013-09-16 ENCOUNTER — Other Ambulatory Visit: Payer: Self-pay | Admitting: *Deleted

## 2013-09-16 MED ORDER — WARFARIN SODIUM 2.5 MG PO TABS
ORAL_TABLET | ORAL | Status: DC
Start: 2013-09-16 — End: 2014-01-18

## 2013-09-18 ENCOUNTER — Emergency Department (HOSPITAL_BASED_OUTPATIENT_CLINIC_OR_DEPARTMENT_OTHER): Payer: Medicare Other

## 2013-09-18 ENCOUNTER — Encounter (HOSPITAL_BASED_OUTPATIENT_CLINIC_OR_DEPARTMENT_OTHER): Payer: Self-pay | Admitting: Emergency Medicine

## 2013-09-18 ENCOUNTER — Emergency Department (HOSPITAL_BASED_OUTPATIENT_CLINIC_OR_DEPARTMENT_OTHER)
Admission: EM | Admit: 2013-09-18 | Discharge: 2013-09-18 | Disposition: A | Discharge status: Home / Self Care | Payer: Medicare Other | Attending: Emergency Medicine | Admitting: Emergency Medicine

## 2013-09-18 DIAGNOSIS — Z8719 Personal history of other diseases of the digestive system: Secondary | ICD-10-CM | POA: Diagnosis not present

## 2013-09-18 DIAGNOSIS — M899 Disorder of bone, unspecified: Secondary | ICD-10-CM | POA: Insufficient documentation

## 2013-09-18 DIAGNOSIS — IMO0002 Reserved for concepts with insufficient information to code with codable children: Secondary | ICD-10-CM | POA: Diagnosis not present

## 2013-09-18 DIAGNOSIS — Z79899 Other long term (current) drug therapy: Secondary | ICD-10-CM | POA: Diagnosis not present

## 2013-09-18 DIAGNOSIS — I10 Essential (primary) hypertension: Secondary | ICD-10-CM | POA: Insufficient documentation

## 2013-09-18 DIAGNOSIS — Z792 Long term (current) use of antibiotics: Secondary | ICD-10-CM | POA: Insufficient documentation

## 2013-09-18 DIAGNOSIS — Z8701 Personal history of pneumonia (recurrent): Secondary | ICD-10-CM | POA: Diagnosis not present

## 2013-09-18 DIAGNOSIS — I4891 Unspecified atrial fibrillation: Secondary | ICD-10-CM | POA: Insufficient documentation

## 2013-09-18 DIAGNOSIS — Z7901 Long term (current) use of anticoagulants: Secondary | ICD-10-CM | POA: Diagnosis not present

## 2013-09-18 DIAGNOSIS — M949 Disorder of cartilage, unspecified: Secondary | ICD-10-CM | POA: Diagnosis not present

## 2013-09-18 DIAGNOSIS — Z87448 Personal history of other diseases of urinary system: Secondary | ICD-10-CM | POA: Insufficient documentation

## 2013-09-18 DIAGNOSIS — Z862 Personal history of diseases of the blood and blood-forming organs and certain disorders involving the immune mechanism: Secondary | ICD-10-CM | POA: Diagnosis not present

## 2013-09-18 DIAGNOSIS — Z87440 Personal history of urinary (tract) infections: Secondary | ICD-10-CM | POA: Diagnosis not present

## 2013-09-18 DIAGNOSIS — M7989 Other specified soft tissue disorders: Secondary | ICD-10-CM | POA: Diagnosis not present

## 2013-09-18 DIAGNOSIS — E785 Hyperlipidemia, unspecified: Secondary | ICD-10-CM | POA: Insufficient documentation

## 2013-09-18 DIAGNOSIS — Z8739 Personal history of other diseases of the musculoskeletal system and connective tissue: Secondary | ICD-10-CM | POA: Diagnosis not present

## 2013-09-18 DIAGNOSIS — M79609 Pain in unspecified limb: Secondary | ICD-10-CM | POA: Insufficient documentation

## 2013-09-18 LAB — CBC WITH DIFFERENTIAL/PLATELET
BASOS ABS: 0 10*3/uL (ref 0.0–0.1)
BASOS PCT: 0 % (ref 0–1)
Eosinophils Absolute: 0.1 10*3/uL (ref 0.0–0.7)
Eosinophils Relative: 1 % (ref 0–5)
HCT: 36.4 % (ref 36.0–46.0)
Hemoglobin: 12.1 g/dL (ref 12.0–15.0)
Lymphocytes Relative: 26 % (ref 12–46)
Lymphs Abs: 1.4 10*3/uL (ref 0.7–4.0)
MCH: 31 pg (ref 26.0–34.0)
MCHC: 33.2 g/dL (ref 30.0–36.0)
MCV: 93.3 fL (ref 78.0–100.0)
MONO ABS: 0.4 10*3/uL (ref 0.1–1.0)
Monocytes Relative: 7 % (ref 3–12)
NEUTROS ABS: 3.7 10*3/uL (ref 1.7–7.7)
NEUTROS PCT: 66 % (ref 43–77)
Platelets: 161 10*3/uL (ref 150–400)
RBC: 3.9 MIL/uL (ref 3.87–5.11)
RDW: 14.6 % (ref 11.5–15.5)
WBC: 5.6 10*3/uL (ref 4.0–10.5)

## 2013-09-18 LAB — PROTIME-INR
INR: 3.26 — AB (ref 0.00–1.49)
PROTHROMBIN TIME: 33.2 s — AB (ref 11.6–15.2)

## 2013-09-18 LAB — BASIC METABOLIC PANEL
Anion gap: 14 (ref 5–15)
BUN: 16 mg/dL (ref 6–23)
CO2: 26 mEq/L (ref 19–32)
CREATININE: 1.1 mg/dL (ref 0.50–1.10)
Calcium: 9.1 mg/dL (ref 8.4–10.5)
Chloride: 103 mEq/L (ref 96–112)
GFR calc non Af Amer: 44 mL/min — ABNORMAL LOW (ref >90)
GFR, EST AFRICAN AMERICAN: 51 mL/min — AB (ref >90)
Glucose, Bld: 97 mg/dL (ref 70–99)
POTASSIUM: 4.2 meq/L (ref 3.7–5.3)
Sodium: 143 mEq/L (ref 137–147)

## 2013-09-18 NOTE — ED Notes (Signed)
MD at bedside to discuss results of testing. 

## 2013-09-18 NOTE — ED Notes (Addendum)
Pt c/o left lower leg pain and swelling after completion of Levaquin for pneumonia x 10 days ago

## 2013-09-18 NOTE — ED Notes (Signed)
PA-C at bedside 

## 2013-09-18 NOTE — ED Notes (Signed)
Pt ambulatory to restroom

## 2013-09-18 NOTE — Discharge Instructions (Signed)
Peripheral Edema °You have swelling in your legs (peripheral edema). This swelling is due to excess accumulation of salt and water in your body. Edema may be a sign of heart, kidney or liver disease, or a side effect of a medication. It may also be due to problems in the leg veins. Elevating your legs and using special support stockings may be very helpful, if the cause of the swelling is due to poor venous circulation. Avoid long periods of standing, whatever the cause. °Treatment of edema depends on identifying the cause. Chips, pretzels, pickles and other salty foods should be avoided. Restricting salt in your diet is almost always needed. Water pills (diuretics) are often used to remove the excess salt and water from your body via urine. These medicines prevent the kidney from reabsorbing sodium. This increases urine flow. °Diuretic treatment may also result in lowering of potassium levels in your body. Potassium supplements may be needed if you have to use diuretics daily. Daily weights can help you keep track of your progress in clearing your edema. You should call your caregiver for follow up care as recommended. °SEEK IMMEDIATE MEDICAL CARE IF:  °· You have increased swelling, pain, redness, or heat in your legs. °· You develop shortness of breath, especially when lying down. °· You develop chest or abdominal pain, weakness, or fainting. °· You have a fever. °Document Released: 01/26/2004 Document Revised: 03/12/2011 Document Reviewed: 01/05/2009 °Exit[employer]® Patient Information ©2015 [employer]. This information is not intended to replace advice given to you by your health care provider. Make sure you discuss any questions you have with your health care provider. ° °

## 2013-09-18 NOTE — ED Provider Notes (Signed)
CSN: [id number]     Arrival date & time 09/18/13  1721 History   First MD Initiated Contact with Patient 09/18/13 1743     Chief Complaint  Patient presents with  . Leg Pain     (Consider location/radiation/quality/duration/timing/severity/associated sxs/prior Treatment) Patient is a 80 y.o. female presenting with leg pain. The history is provided by the patient. No language interpreter was used.  Leg Pain Location:  Leg Time since incident:  10 days Injury: no   Leg location:  L lower leg Chronicity:  New Foreign body present:  No foreign bodies Associated symptoms: no fever   Associated symptoms comment:  She presents for evaluation of lower extremity pain and swelling that she noticed after starting Levaquin for PNA. She reports symptoms of chest tightness, cough and SOB with PNA are completely cleared. She states around the same time she started taking Levaquin, she noticed her left lower leg becoming painful with movement and swollen. No injury.   Past Medical History  Diagnosis Date  . Paroxysmal a-fib   . Mitral and aortic regurgitation   . Mitral valve prolapse     hx of  . Hypertension   . Hyperlipidemia   . Vitamin D deficiency   . Pneumonia   . Tracheitis   . Gait difficulty   . Anemia   . Melena   . IBS (irritable bowel syndrome)   . Incontinence   . GERD (gastroesophageal reflux disease)   . Osteopenia   . Thyroid disease     hypo  . Personal history of colonic polyps   . Current use of long term anticoagulation   . Cough   . Diarrhea   . Pneumonia     organism unspecified  . Constipation   . UTI (lower urinary tract infection)   . Unspecified adverse effect of other drug, medicinal and biological substance(995.29)   . History of mammogram 2009  . Diverticulosis   . GERD (gastroesophageal reflux disease)   . Hyperlipidemia   . Atrial fibrillation   . Arthritis     hips, knees  . Headache(784.0) 06/22/2012  . Renal insufficiency 06/28/2012  .  Hyperkalemia 06/28/2012   Past Surgical History  Procedure Laterality Date  . Breast biopsy Left   . Cardiac electrophysiology mapping and ablation    . Abdominal hysterectomy      partial, ovaries left in place  . Cataract extraction, bilateral    . Esophagoscopy w/ botox injection    . Esophagogastroduodenoscopy N/A 09/03/2012    Procedure: ESOPHAGOGASTRODUODENOSCOPY (EGD);  Surgeon: Inda Castle, MD;  Location: [facility];  Service: Endoscopy;  Laterality: N/A;  . Botox injection N/A 09/03/2012    Procedure: BOTOX INJECTION;  Surgeon: Inda Castle, MD;  Location: [facility];  Service: Endoscopy;  Laterality: N/A;  . I&d extremity Right 11/06/2012    Procedure: IRRIGATION AND DEBRIDEMENT EXTREMITY Right Ring Finger;  Surgeon: Tennis Must, MD;  Location: [facility];  Service: Orthopedics;  Laterality: Right;   Family History  Problem Relation Age of Onset  . Diabetes Mother   . CVA Mother   . COPD Father   . Heart disease Maternal Aunt   . Heart disease Maternal Uncle   . Allergies Daughter   . COPD Daughter   . Stroke Daughter   . COPD Daughter     previous smoker  . Stroke Maternal Grandfather    History  Substance Use Topics  . Smoking status: Never Smoker   . Smokeless tobacco:  Never Used  . Alcohol Use: No   OB History   Grav Para Term Preterm Abortions TAB SAB Ect Mult Living                 Review of Systems  Constitutional: Negative for fever and chills.  Respiratory: Negative.  Negative for shortness of breath.   Cardiovascular: Positive for leg swelling. Negative for chest pain and palpitations.  Gastrointestinal: Negative.  Negative for nausea and abdominal pain.  Genitourinary: Negative.   Musculoskeletal:       See HPI.  Skin: Negative.  Negative for color change.  Neurological: Negative.  Negative for numbness.      Allergies  Darifenacin hydrobromide; Sulfonamide derivatives; and Tramadol  Home Medications   Prior to Admission medications    Medication Sig Start Date End Date Taking? Authorizing Provider  albuterol (PROVENTIL HFA;VENTOLIN HFA) 108 (90 BASE) MCG/ACT inhaler Inhale 1-2 puffs into the lungs every 6 (six) hours as needed for wheezing. 08/30/13   Tanna Furry, MD  ALPRAZolam Duanne Moron) 0.25 MG tablet Take 1 tablet (0.25 mg total) by mouth 3 (three) times daily as needed for anxiety. 08/17/13   Mosie Lukes, MD  atorvastatin (LIPITOR) 10 MG tablet Take 1 tablet (10 mg total) by mouth daily at 6 PM. 06/30/13   Mosie Lukes, MD  benzonatate (TESSALON) 100 MG capsule Take 1 capsule (100 mg total) by mouth every 8 (eight) hours. 08/30/13   Tanna Furry, MD  Cholecalciferol (VITAMIN D) 2000 UNITS CAPS Take 2,000 Units by mouth every other day. 02/20/12   Mosie Lukes, MD  fluticasone (FLONASE) 50 MCG/ACT nasal spray PLACE 2 SPRAYS INTO BOTH NOSTRILS DAILY. 09/12/13   Brunetta Jeans, PA-C  levofloxacin (LEVAQUIN) 500 MG tablet Take 1 tablet (500 mg total) by mouth daily. 08/30/13   Tanna Furry, MD  metoprolol tartrate (LOPRESSOR) 25 MG tablet Take 0.5 tablets (12.5 mg total) by mouth 2 (two) times daily. bid 01/30/13   Mosie Lukes, MD  NEXIUM 40 MG capsule TAKE ONE CAPSULE IN THE MORNING AND 1 IN THE EVENING FOR REFLUX AND COUGH 03/25/13   Mosie Lukes, MD  ondansetron (ZOFRAN ODT) 4 MG disintegrating tablet 4mg  ODT q4 hours prn nausea/vomit 06/23/13   Mariea Clonts, MD  predniSONE (DELTASONE) 10 MG tablet Take 2 tablets (20 mg total) by mouth daily. 08/30/13   Tanna Furry, MD  ramipril (ALTACE) 10 MG capsule Take 1 capsule (10 mg total) by mouth daily. 01/30/13   Mosie Lukes, MD  vitamin B-12 (CYANOCOBALAMIN) 1000 MCG tablet Take 1 tablet (1,000 mcg total) by mouth daily. 02/20/12   Mosie Lukes, MD  warfarin (COUMADIN) 2.5 MG tablet Take as directed by Anticoagulation clinic 09/16/13   Dorothy Spark, MD   BP 182/61  Pulse 69  Temp(Src) 97.8 F (36.6 C) (Oral)  Resp 20  Ht 4\' 9"  (1.448 m)  Wt 118 lb (53.524 kg)  BMI 25.53  kg/m2  SpO2 98% Physical Exam  Constitutional: She is oriented to person, place, and time. She appears well-developed and well-nourished.  HENT:  Head: Normocephalic.  Neck: Normal range of motion. Neck supple.  Cardiovascular: Normal rate and intact distal pulses.   Pulmonary/Chest: Effort normal.  Abdominal: She exhibits no distension.  Musculoskeletal:  Left lower extremity swollen, tender to lateral and posterior aspects. No palpable mass. No discoloration.  Neurological: She is alert and oriented to person, place, and time.  Skin: Skin is warm and dry.  ED Course  Procedures (including critical care time) Labs Review Labs Reviewed  PROTIME-INR - Abnormal; Notable for the following:    Prothrombin Time 33.2 (*)    INR 3.26 (*)    All other components within normal limits  BASIC METABOLIC PANEL - Abnormal; Notable for the following:    GFR calc non Af Amer 44 (*)    GFR calc Af Amer 51 (*)    All other components within normal limits  CBC WITH DIFFERENTIAL    Imaging Review US Venous Img Lower Unilateral Left  09/18/2013   CLINICAL DATA:  Patient completed a course of Levaquin for pneumonia MI/ 10/2013. Patient currently anticoagulated with Coumadin for atrial fibrillation. She presents now with acute left calf pain and swelling.  EXAM: LEFT LOWER EXTREMITY VENOUS DOPPLER ULTRASOUND  TECHNIQUE: Gray-scale sonography with graded compression, as well as color Doppler and duplex ultrasound were performed to evaluate the lower extremity deep venous systems from the level of the common femoral vein and including the common femoral, femoral, profunda femoral, popliteal and calf veins including the posterior tibial, peroneal and gastrocnemius veins when visible. The superficial great saphenous vein was also interrogated. Spectral Doppler was utilized to evaluate flow at rest and with distal augmentation maneuvers in the common femoral, femoral and popliteal veins.  COMPARISON:  None.   FINDINGS: Common Femoral Vein: No evidence of thrombus. Normal compressibility, respiratory phasicity and response to augmentation.  Saphenofemoral Junction: No evidence of thrombus. Normal compressibility and flow on color Doppler imaging.  Profunda Femoral Vein: No evidence of thrombus. Normal compressibility and flow on color Doppler imaging.  Femoral Vein: No evidence of thrombus. Normal compressibility, respiratory phasicity and response to augmentation.  Popliteal Vein: No evidence of thrombus. Normal compressibility, respiratory phasicity and response to augmentation.  Calf Veins: No evidence of thrombus. Normal compressibility and flow on color Doppler imaging.  Superficial Great Saphenous Vein: No evidence of thrombus. Normal compressibility.  Venous Reflux:  Not examined.  Other Findings:  None.  IMPRESSION: No evidence of left lower extremity DVT.   Electronically Signed   By: Evangeline Dakin M.D.   On: 09/18/2013 19:14     EKG Interpretation None      MDM   Final diagnoses:  None    1. Lower extremity swelling  Study negative for DVT and patient supratherapeutic on her Coumadin. She has been examined by Dr. Aline Brochure and felt appropriate for discharge with instructions to follow up with her doctor on Monday (in 2 days) for recheck.     Dewaine Oats, PA-C 09/18/13 1944

## 2013-09-19 NOTE — ED Provider Notes (Signed)
Medical screening examination/treatment/procedure(s) were conducted as a shared visit with non-physician practitioner(s) and myself.  I personally evaluated the patient during the encounter.   EKG Interpretation None      I interviewed and examined the patient. Lungs are CTAB. Cardiac exam wnl. Abdomen soft.  Slight asymmetry to LLE w/ mild pitting edema. [place] neg for DVT. Labs non-contrib. Will rec elevation and f/u w/ pcp.    Pamella Pert, MD 09/19/13 1013

## 2013-09-22 ENCOUNTER — Other Ambulatory Visit: Payer: Self-pay

## 2013-09-22 ENCOUNTER — Ambulatory Visit (INDEPENDENT_AMBULATORY_CARE_PROVIDER_SITE_OTHER): Payer: Medicare Other | Admitting: Medical

## 2013-09-22 DIAGNOSIS — L03119 Cellulitis of unspecified part of limb: Secondary | ICD-10-CM

## 2013-09-22 DIAGNOSIS — L03116 Cellulitis of left lower limb: Secondary | ICD-10-CM

## 2013-09-22 DIAGNOSIS — L02419 Cutaneous abscess of limb, unspecified: Secondary | ICD-10-CM

## 2013-09-22 DIAGNOSIS — L0390 Cellulitis, unspecified: Secondary | ICD-10-CM | POA: Insufficient documentation

## 2013-09-22 MED ORDER — CEPHALEXIN 500 MG PO CAPS: 500.0000 mg | ORAL_CAPSULE | Freq: Three times a day (TID) | ORAL | Status: DC

## 2013-09-22 MED ORDER — ESOMEPRAZOLE MAGNESIUM 40 MG PO CPDR: 40.0000 mg | DELAYED_RELEASE_CAPSULE | Freq: Two times a day (BID) | ORAL | Status: DC

## 2013-09-22 NOTE — Progress Notes (Signed)
Subjective:    Patient ID: [name] [name], female    DOB: June 03, 1924, 80 y.o.   MRN: [id number]  HPI  Pt in with some left leg swelling for couple of weeks. No rt lower ext swelling. Pt went to ED on Friday. They did an ultrasound. No dvt. No medication was given. Pt states leg does feel better overall. No dyspnea. No fever or chills. Pt had bruised to area before swelling.  Pt did have some pneumonia at end of august. She was on levofloxin 500 mg for 10 days. She thinks this may have effected muscles in her left leg.  Past Medical History  Diagnosis Date  . Paroxysmal a-fib   . Mitral and aortic regurgitation   . Mitral valve prolapse     hx of  . Hypertension   . Hyperlipidemia   . Vitamin D deficiency   . Pneumonia   . Tracheitis   . Gait difficulty   . Anemia   . Melena   . IBS (irritable bowel syndrome)   . Incontinence   . GERD (gastroesophageal reflux disease)   . Osteopenia   . Thyroid disease     hypo  . Personal history of colonic polyps   . Current use of long term anticoagulation   . Cough   . Diarrhea   . Pneumonia     organism unspecified  . Constipation   . UTI (lower urinary tract infection)   . Unspecified adverse effect of other drug, medicinal and biological substance(995.29)   . History of mammogram 2009  . Diverticulosis   . GERD (gastroesophageal reflux disease)   . Hyperlipidemia   . Atrial fibrillation   . Arthritis     hips, knees  . Headache(784.0) 06/22/2012  . Renal insufficiency 06/28/2012  . Hyperkalemia 06/28/2012    History   Social History  . Marital Status: Widowed    Spouse Name: N/A    Number of Children: 3  . Years of Education: N/A   Occupational History  .     Social History Main Topics  . Smoking status: Never Smoker   . Smokeless tobacco: Never Used  . Alcohol Use: No  . Drug Use: No  . Sexual Activity: No   Other Topics Concern  . Not on file   Social History Narrative  . No narrative on file    Past  Surgical History  Procedure Laterality Date  . Breast biopsy Left   . Cardiac electrophysiology mapping and ablation    . Abdominal hysterectomy      partial, ovaries left in place  . Cataract extraction, bilateral    . Esophagoscopy w/ botox injection    . Esophagogastroduodenoscopy N/A 09/03/2012    Procedure: ESOPHAGOGASTRODUODENOSCOPY (EGD);  Surgeon: Inda Castle, MD;  Location: [facility];  Service: Endoscopy;  Laterality: N/A;  . Botox injection N/A 09/03/2012    Procedure: BOTOX INJECTION;  Surgeon: Inda Castle, MD;  Location: [facility];  Service: Endoscopy;  Laterality: N/A;  . I&d extremity Right 11/06/2012    Procedure: IRRIGATION AND DEBRIDEMENT EXTREMITY Right Ring Finger;  Surgeon: Tennis Must, MD;  Location: [name];  Service: Orthopedics;  Laterality: Right;    Family History  Problem Relation Age of Onset  . Diabetes Mother   . CVA Mother   . COPD Father   . Heart disease Maternal Aunt   . Heart disease Maternal Uncle   . Allergies Daughter   . COPD Daughter   .  Stroke Daughter   . COPD Daughter     previous smoker  . Stroke Maternal Grandfather     Allergies  Allergen Reactions  . Darifenacin Hydrobromide     REACTION: causes her dizziness  . Sulfonamide Derivatives   . Tramadol Other (See Comments)    Insomnia, anorexia    Current Outpatient Prescriptions on File Prior to Visit  Medication Sig Dispense Refill  . albuterol (PROVENTIL HFA;VENTOLIN HFA) 108 (90 BASE) MCG/ACT inhaler Inhale 1-2 puffs into the lungs every 6 (six) hours as needed for wheezing.  1 Inhaler  0  . ALPRAZolam (XANAX) 0.25 MG tablet Take 1 tablet (0.25 mg total) by mouth 3 (three) times daily as needed for anxiety.  90 tablet  1  . atorvastatin (LIPITOR) 10 MG tablet Take 1 tablet (10 mg total) by mouth daily at 6 PM.  30 tablet  2  . Cholecalciferol (VITAMIN D) 2000 UNITS CAPS Take 2,000 Units by mouth every other day.      . fluticasone (FLONASE) 50 MCG/ACT nasal spray  PLACE 2 SPRAYS INTO BOTH NOSTRILS DAILY.  16 g  6  . metoprolol tartrate (LOPRESSOR) 25 MG tablet Take 0.5 tablets (12.5 mg total) by mouth 2 (two) times daily. bid  90 tablet  3  . NEXIUM 40 MG capsule TAKE ONE CAPSULE IN THE MORNING AND 1 IN THE EVENING FOR REFLUX AND COUGH  180 capsule  1  . ondansetron (ZOFRAN ODT) 4 MG disintegrating tablet 4mg  ODT q4 hours prn nausea/vomit  4 tablet  0  . ramipril (ALTACE) 10 MG capsule Take 1 capsule (10 mg total) by mouth daily.  90 capsule  3  . vitamin B-12 (CYANOCOBALAMIN) 1000 MCG tablet Take 1 tablet (1,000 mcg total) by mouth daily.      Marland Kitchen warfarin (COUMADIN) 2.5 MG tablet Take as directed by Anticoagulation clinic  40 tablet  3  . benzonatate (TESSALON) 100 MG capsule Take 1 capsule (100 mg total) by mouth every 8 (eight) hours.  21 capsule  0  . levofloxacin (LEVAQUIN) 500 MG tablet Take 1 tablet (500 mg total) by mouth daily.  10 tablet  0  . predniSONE (DELTASONE) 10 MG tablet Take 2 tablets (20 mg total) by mouth daily.  10 tablet  0   No current facility-administered medications on file prior to visit.    There were no vitals taken for this visit.       Review of Systems  Constitutional: Negative for chills, diaphoresis and fatigue.  Respiratory: Negative for cough, choking, chest tightness and wheezing.   Cardiovascular: Negative for chest pain and palpitations.  Gastrointestinal: Negative.   Musculoskeletal:       Lt leg mild swollen and mild pain.   Skin:       Mild warmth to skin lateral aspect of calf.  Hematological: Negative for adenopathy.       Small burise distal calf. She describes mild accident with grandson       Objective:   Physical Exam   General- No acute distress. Pleasant.  Lateral aspect of her calf slight warm and tender. Distal pretibial bruise. Negative homans signs. Mild swelling of calf. More so over lateral aspect. Also lateral aspect mild warm to touch and mild tender. Mild achilles tendon  tenderness but normal plantar and dorsiflexion. No defect over achilles tendon  Lungs- clear, even and unlabored.  Heart- Regular, rate and rythm        Assessment & Plan:  I did explain  to pt possible effect of levofloxin on tendon. Advised not to do any excess exercise or activity out of normal. While achilles tendon still has mild pain.

## 2013-09-22 NOTE — Patient Instructions (Addendum)
Your scan of your left leg was negative for dvt. I reviewed this today. By  exam your leg is mild warm, tender and mild swollen. I will treat you for early cellulitis with cephalexin. If you think your leg appears worse please notify us. In that event would switch antibiotics that would be stronger but have some potential side effects. Follow up in 7 days or as needed.   If at any point your get pain directly behind the knee be re-evaluated.

## 2013-09-22 NOTE — Progress Notes (Signed)
Pre visit review using our clinic review tool, if applicable. No additional management support is needed unless otherwise documented below in the visit note. 

## 2013-09-22 NOTE — Assessment & Plan Note (Signed)
Cellulitis of left lower ext. Pt had negative doppler recently. Neg homans sign today. Start cephalexin. Area should gradually improve. If not notify us. If any popliteal region pain advised needs immediate evaluation. Follow up in 7 days or as needed.

## 2013-09-25 ENCOUNTER — Other Ambulatory Visit: Payer: Self-pay | Admitting: Family Medicine

## 2013-09-25 ENCOUNTER — Ambulatory Visit (INDEPENDENT_AMBULATORY_CARE_PROVIDER_SITE_OTHER): Payer: Medicare Other

## 2013-09-25 DIAGNOSIS — Z7901 Long term (current) use of anticoagulants: Secondary | ICD-10-CM

## 2013-09-25 DIAGNOSIS — I4891 Unspecified atrial fibrillation: Secondary | ICD-10-CM

## 2013-09-25 DIAGNOSIS — Z5181 Encounter for therapeutic drug level monitoring: Secondary | ICD-10-CM

## 2013-09-25 LAB — POCT INR: INR: 3.8

## 2013-09-25 MED ORDER — ESOMEPRAZOLE MAGNESIUM 40 MG PO CPDR: 40.0000 mg | DELAYED_RELEASE_CAPSULE | Freq: Two times a day (BID) | ORAL | Status: DC

## 2013-09-25 NOTE — Addendum Note (Signed)
Addended by: Varney Daily on: 09/25/2013 11:28 AM   Modules accepted: Orders

## 2013-09-27 ENCOUNTER — Telehealth: Payer: Self-pay | Admitting: Physician Assistant

## 2013-09-27 DIAGNOSIS — R9389 Abnormal findings on diagnostic imaging of other specified body structures: Secondary | ICD-10-CM

## 2013-09-27 DIAGNOSIS — J189 Pneumonia, unspecified organism: Secondary | ICD-10-CM

## 2013-09-27 NOTE — Telephone Encounter (Signed)
Please call patient and have her come to the [facility] for Chest X-ray.  We need to assess her lungs to make sure the suspicious place found on previous x-ray was solely pneumonia and not concerning for a mass.

## 2013-09-27 NOTE — Telephone Encounter (Signed)
Message copied by Raiford Noble on Sun Sep 27, 2013  4:52 PM ------      Message from: Raiford Noble      Created: Tue Sep 08, 2013  7:41 PM       Order Chest X-ray to ensure resolution of infiltrate and help r/o neoplasm. ------

## 2013-09-28 NOTE — Telephone Encounter (Signed)
Patient informed, understood & agreed; has f/u appt on Wed, 09.30.15 and will do Xray then/SLS

## 2013-09-30 ENCOUNTER — Ambulatory Visit (INDEPENDENT_AMBULATORY_CARE_PROVIDER_SITE_OTHER): Payer: Medicare Other | Admitting: Medical

## 2013-09-30 ENCOUNTER — Ambulatory Visit (HOSPITAL_BASED_OUTPATIENT_CLINIC_OR_DEPARTMENT_OTHER)
Admission: RE | Admit: 2013-09-30 | Discharge: 2013-09-30 | Disposition: A | Discharge status: Home / Self Care | Payer: Medicare Other | Source: Ambulatory Visit | Attending: Physician Assistant | Admitting: Physician Assistant

## 2013-09-30 ENCOUNTER — Encounter: Payer: Self-pay | Admitting: Medical

## 2013-09-30 VITALS — BP 160/80 | HR 63 | Temp 98.1°F | Ht 59.2 in | Wt 120.2 lb

## 2013-09-30 DIAGNOSIS — R05 Cough: Secondary | ICD-10-CM | POA: Diagnosis present

## 2013-09-30 DIAGNOSIS — L02419 Cutaneous abscess of limb, unspecified: Secondary | ICD-10-CM

## 2013-09-30 DIAGNOSIS — R0989 Other specified symptoms and signs involving the circulatory and respiratory systems: Secondary | ICD-10-CM | POA: Insufficient documentation

## 2013-09-30 DIAGNOSIS — R059 Cough, unspecified: Secondary | ICD-10-CM | POA: Diagnosis not present

## 2013-09-30 DIAGNOSIS — R9389 Abnormal findings on diagnostic imaging of other specified body structures: Secondary | ICD-10-CM

## 2013-09-30 DIAGNOSIS — L03116 Cellulitis of left lower limb: Secondary | ICD-10-CM

## 2013-09-30 DIAGNOSIS — I10 Essential (primary) hypertension: Secondary | ICD-10-CM

## 2013-09-30 DIAGNOSIS — L03119 Cellulitis of unspecified part of limb: Secondary | ICD-10-CM

## 2013-09-30 DIAGNOSIS — J189 Pneumonia, unspecified organism: Secondary | ICD-10-CM

## 2013-09-30 NOTE — Assessment & Plan Note (Signed)
Pt bp is elevated today. I think it is best based on trend and high level of systolics to increase her ramipril 10 mg to bid. Continue metoprolol 1/2 tab po q day as her cardiologist last recommended. Check bp every other day and document readings. Show bp readings to cardiologist when she has appointment in 1 wk.

## 2013-09-30 NOTE — Progress Notes (Signed)
   Subjective:    Patient ID: [name] [name], female    DOB: 1924/01/16, 80 y.o.   MRN: [id number]  HPI   Pt in for follow up. Leg is less swollen and not warm. No pain. Pt finished antibiotic last night and no side effect. Her cxr done recenlty was neg by [facility] before I saw her showed no effusion. Her doppler on left leg showed not dvt.   Pt bp is high. She does get high bp reading in our office at times. Moderate high. Also her bp  When left the ED her bp systolic was 250. Currently no ha, no chest pain. No gross motor or sensory function deficits. Also denies getting any daily neurologic or cardiac symptoms.       Review of Systems  Constitutional: Negative for fever, chills and fatigue.  HENT: Negative.   Respiratory: Negative for cough, choking, chest tightness and wheezing.   Cardiovascular: Negative for chest pain and palpitations.  Gastrointestinal: Negative.   Musculoskeletal: Negative for arthralgias, back pain, gait problem, myalgias and neck pain.       Lt leg no longer warm and tender. Maybe mild more swollen lt side compared to rt side.  Neurological: Negative.   Hematological: Negative for adenopathy. Does not bruise/bleed easily.  Psychiatric/Behavioral: Negative.        Objective:   Physical Exam  General Mental Status- Alert. General Appearance- Not in acute distress.   Skin General: Color- Normal Color. Moisture- Normal Moisture.  Neck Carotid Arteries- Normal color. Moisture- Normal Moisture. No carotid bruits. No JVD.  Chest and Lung Exam Auscultation: Breath Sounds:-Normal.  Cardiovascular Auscultation:Rythm- Regular. Murmurs & Other Heart Sounds:Auscultation of the heart reveals- No Murmurs.   Neurologic Cranial Nerve exam:- CN III-XII intact(No nystagmus), symmetric smile. Romberg Exam:- Negative.  Strength:- 5/5 equal and symmetric strength both upper and lower extremities.  Lt leg- neg homans sign. No longer feels warm and not tender.  Maybe minimal larger lt calf compared to rt side.         Assessment & Plan:

## 2013-09-30 NOTE — Patient Instructions (Addendum)
Your skin infection of the left leg appears improved now.  Regarding your htn, Continue your metoprolol at 1/2 tab as cardiologist recommended. But you could take ramipril 10 mg twice a day. Check your bp readings every other day and document readings. Show your cardiologist bp reading in a week and see if she agrees with this regimen. If any adverse side effects with med change  prior to cardiologist appointment notify us.  Follow up 3 wks or as needed. Please up date [place] if cardiologist agrees with increase of ramipril or if he has other ideas for your blood pressure.

## 2013-09-30 NOTE — Assessment & Plan Note (Signed)
Resolved now. 

## 2013-10-05 ENCOUNTER — Other Ambulatory Visit: Payer: Self-pay | Admitting: Family Medicine

## 2013-10-05 DIAGNOSIS — J449 Chronic obstructive pulmonary disease, unspecified: Secondary | ICD-10-CM

## 2013-10-08 ENCOUNTER — Encounter: Payer: Self-pay | Admitting: *Deleted

## 2013-10-09 ENCOUNTER — Encounter: Payer: Self-pay | Admitting: Cardiology

## 2013-10-09 ENCOUNTER — Ambulatory Visit (INDEPENDENT_AMBULATORY_CARE_PROVIDER_SITE_OTHER): Payer: Medicare Other | Admitting: Cardiology

## 2013-10-09 ENCOUNTER — Ambulatory Visit (INDEPENDENT_AMBULATORY_CARE_PROVIDER_SITE_OTHER): Payer: Medicare Other | Admitting: Pharmacist

## 2013-10-09 VITALS — BP 132/80 | HR 75 | Ht 59.2 in | Wt 118.0 lb

## 2013-10-09 DIAGNOSIS — I4891 Unspecified atrial fibrillation: Secondary | ICD-10-CM

## 2013-10-09 DIAGNOSIS — Z7901 Long term (current) use of anticoagulants: Secondary | ICD-10-CM

## 2013-10-09 DIAGNOSIS — Z5181 Encounter for therapeutic drug level monitoring: Secondary | ICD-10-CM

## 2013-10-09 DIAGNOSIS — I071 Rheumatic tricuspid insufficiency: Secondary | ICD-10-CM

## 2013-10-09 DIAGNOSIS — I270 Primary pulmonary hypertension: Secondary | ICD-10-CM

## 2013-10-09 DIAGNOSIS — I272 Pulmonary hypertension, unspecified: Secondary | ICD-10-CM

## 2013-10-09 LAB — POCT INR: INR: 3

## 2013-10-09 NOTE — Patient Instructions (Signed)
Your physician recommends that you continue on your current medications as directed. Please refer to the Current Medication list given to you today.   Your physician wants you to follow-up in: ONE YEAR WITH DR NELSON You will receive a reminder letter in the mail two months in advance. If you don't receive a letter, please call our office to schedule the follow-up appointment.  

## 2013-10-09 NOTE — Progress Notes (Signed)
Patient ID: ZSOFIA PROUT, female   DOB: [birth year], 80 y.o.   MRN: [id number]   Patient Name: [name] [name] Date of Encounter: 10/09/2013  Primary Care Provider:  Penni Homans, MD Primary Cardiologist:  Dorothy Spark  Patient Profile  1 year follow up  Problem List   Past Medical History  Diagnosis Date  . Paroxysmal a-fib   . Mitral and aortic regurgitation   . Mitral valve prolapse     hx of  . Hypertension   . Hyperlipidemia   . Vitamin D deficiency   . Pneumonia   . Tracheitis   . Gait difficulty   . Anemia   . Melena   . IBS (irritable bowel syndrome)   . Incontinence   . GERD (gastroesophageal reflux disease)   . Osteopenia   . Thyroid disease     hypo  . Personal history of colonic polyps   . Current use of long term anticoagulation   . Cough   . Diarrhea   . Pneumonia     organism unspecified  . Constipation   . UTI (lower urinary tract infection)   . Unspecified adverse effect of other drug, medicinal and biological substance(995.29)   . History of mammogram 2009  . Diverticulosis   . GERD (gastroesophageal reflux disease)   . Hyperlipidemia   . Atrial fibrillation   . Arthritis     hips, knees  . Headache(784.0) 06/22/2012  . Renal insufficiency 06/28/2012  . Hyperkalemia 06/28/2012   Past Surgical History  Procedure Laterality Date  . Breast biopsy Left   . Cardiac electrophysiology mapping and ablation    . Partial hysterectomy      ovaries left in place  . Cataract extraction, bilateral    . Esophagoscopy w/ botox injection    . Esophagogastroduodenoscopy N/A 09/03/2012    Procedure: ESOPHAGOGASTRODUODENOSCOPY (EGD);  Surgeon: Inda Castle, MD;  Location: [facility];  Service: Endoscopy;  Laterality: N/A;  . Botox injection N/A 09/03/2012    Procedure: BOTOX INJECTION;  Surgeon: Inda Castle, MD;  Location: [facility];  Service: Endoscopy;  Laterality: N/A;  . I&d extremity Right 11/06/2012    Procedure: IRRIGATION AND  DEBRIDEMENT EXTREMITY Right Ring Finger;  Surgeon: Tennis Must, MD;  Location: [facility];  Service: Orthopedics;  Laterality: Right;    Allergies  Allergies  Allergen Reactions  . Darifenacin Hydrobromide     REACTION: causes her dizziness  . Sulfonamide Derivatives   . Tramadol Other (See Comments)    Insomnia, anorexia    HPI  Mrs. Coppola returns today for evaluation and management of her history of paroxysmal A. fib, anticoagulation, history of mild aortic and mitral regurgitation, and history of mitral valve prolapse.  She is a former patient of Dr Verl Blalock, this is her follow up after 1 year. She feels very well, she is still very independent, driving, shopping. She only noticed getting more fatigued with exertion. No chest pain, shortness of breath, palpitations or syncope.  10/09/13 - 1 year follow up, echocardiogram last year showed normal left ventricular function. There is in fact no aortic or mitral regurgitation. She was previously diagnosed with those but they are not seen anymore. There is moderate tricuspid regurgitation and mild pulmonary hypertension.   She is still very independent and denies chest pain or SOB, no palpitations or syncope. Occasional dizziness. Her PCP recently added ramipril for hypertension. Complaint with her meds including warfain, no bleeding or falls. She has inguinal hernia and might  need repair.   Home Medications  Prior to Admission medications   Medication Sig Start Date End Date Taking? Authorizing Provider  warfarin (COUMADIN) 2.5 MG tablet Take 2.5 mg by mouth as directed. Mon, wed, Friday 1.5 tab 08/15/12  Yes Renella Cunas, MD  ALPRAZolam Duanne Moron) 0.25 MG tablet Take 1 tablet (0.25 mg total) by mouth 3 (three) times daily. For stress 06/20/12 06/20/13  Mosie Lukes, MD  atorvastatin (LIPITOR) 10 MG tablet Take 1 tablet (10 mg total) by mouth daily. 05/11/12   Mosie Lukes, MD  calcium acetate (PHOSLO) 667 MG tablet Take 1 tablet by mouth 2  (two) times daily. 10/08/12   Mosie Lukes, MD  Cholecalciferol (VITAMIN D) 2000 UNITS CAPS Take 2,000 Units by mouth every other day. 02/20/12   Mosie Lukes, MD  metoprolol tartrate (LOPRESSOR) 25 MG tablet Take 0.5 tablets (12.5 mg total) by mouth 2 (two) times daily. bid 02/20/12   Mosie Lukes, MD  omeprazole-sodium bicarbonate (ZEGERID) 40-1100 MG per capsule Take one tab at bedtime 08/01/12   Inda Castle, MD  ramipril (ALTACE) 10 MG capsule Take 1 capsule (10 mg total) by mouth daily. 09/25/12   Mosie Lukes, MD  vitamin B-12 (CYANOCOBALAMIN) 1000 MCG tablet Take 1 tablet (1,000 mcg total) by mouth daily. 02/20/12   Mosie Lukes, MD    Family History  Family History  Problem Relation Age of Onset  . Diabetes Mother   . CVA Mother   . COPD Father   . Heart disease Maternal Aunt   . Heart disease Maternal Uncle   . Allergies Daughter   . COPD Daughter   . Stroke Daughter   . COPD Daughter     previous smoker  . Stroke Maternal Grandfather     Social History  History   Social History  . Marital Status: Widowed    Spouse Name: N/A    Number of Children: 3  . Years of Education: N/A   Occupational History  .     Social History Main Topics  . Smoking status: Never Smoker   . Smokeless tobacco: Never Used  . Alcohol Use: No  . Drug Use: No  . Sexual Activity: No   Other Topics Concern  . Not on file   Social History Narrative  . No narrative on file     Review of Systems General:  No chills, fever, night sweats or weight changes.  Cardiovascular:  No chest pain, dyspnea on exertion, edema, orthopnea, palpitations, paroxysmal nocturnal dyspnea. Dermatological: No rash, lesions/masses Respiratory: No cough, dyspnea Urologic: No hematuria, dysuria Abdominal:   No nausea, vomiting, diarrhea, bright red blood per rectum, melena, or hematemesis Neurologic:  No visual changes, wkns, changes in mental status. All other systems reviewed and are otherwise  negative except as noted above.  Physical Exam  BP 156/70, HR 60 General: Pleasant, NAD Psych: Normal affect. Neuro: Alert and oriented X 3. Moves all extremities spontaneously. HEENT: Normal  Neck: Supple without bruits or JVD. Lungs:  Resp regular and unlabored, CTA. Heart: RRR no s3, s4, or murmurs. Abdomen: Soft, non-tender, non-distended, BS + x 4.  Extremities: No clubbing, cyanosis or edema. DP/PT/Radials 2+ and equal bilaterally.  Accessory Clinical Findings  ECG - A-fib, 60 BPM  Lipid Panel     Component Value Date/Time   CHOL 131 05/08/2013 1211   TRIG 151* 05/08/2013 1211   HDL 36* 05/08/2013 1211   CHOLHDL 3.6 05/08/2013 1211  VLDL 30 05/08/2013 1211   LDLCALC 65 05/08/2013 1211   ECHO: 10/27/2013 Study Conclusions  - Left ventricle: The cavity size was normal. Wall thickness was normal. Systolic function was normal. The estimated ejection fraction was in the range of 55% to 60%. Wall motion was normal; there were no regional wall motion abnormalities. - Left atrium: The atrium was mildly dilated. - Right atrium: The atrium was mildly dilated. - Tricuspid valve: Moderate regurgitation. - Pulmonary arteries: Systolic pressure was mildly increased. PA peak pressure: 52mm Hg (S).    Assessment & Plan  80 year old female with h/o HTN, AI and MR, mitral valve prolapse  1. Atrial fibrillation - paroxysmal, on low dose metoprolol, warfarin - no side effects, no falls, she is very independent, we will continue for now and reevaluate the next year.   2. Hypertension - controlled  3. Lipid profile - low HDL, high TAG, we will increase Crestor to 20 mg QHS  4. Moderate TR, mild pulmonary HTN - asymptomatic  Follow up in 1 year.    Dorothy Spark, MD 10/09/2013, 9:56 AM

## 2013-10-12 ENCOUNTER — Other Ambulatory Visit: Payer: Self-pay | Admitting: Family Medicine

## 2013-10-16 ENCOUNTER — Other Ambulatory Visit: Payer: Self-pay | Admitting: Family Medicine

## 2013-10-19 NOTE — Telephone Encounter (Signed)
Last RX was done on 08-17-13 quantity 90 with 1 refill  RX printed for md to sign and fax

## 2013-10-23 ENCOUNTER — Ambulatory Visit (INDEPENDENT_AMBULATORY_CARE_PROVIDER_SITE_OTHER): Payer: Medicare Other | Admitting: Pulmonary Disease

## 2013-10-23 ENCOUNTER — Encounter: Payer: Self-pay | Admitting: Pulmonary Disease

## 2013-10-23 VITALS — BP 124/62 | HR 60 | Temp 97.0°F | Ht 59.0 in | Wt 120.8 lb

## 2013-10-23 DIAGNOSIS — R9389 Abnormal findings on diagnostic imaging of other specified body structures: Secondary | ICD-10-CM | POA: Insufficient documentation

## 2013-10-23 DIAGNOSIS — R938 Abnormal findings on diagnostic imaging of other specified body structures: Secondary | ICD-10-CM

## 2013-10-23 NOTE — Assessment & Plan Note (Signed)
The patient has mild hyperinflation on her chest x-ray, but her spirometry today is totally normal. She also has an excellent functional status, especially for her age, and denies any breathing issues. She does admit to a mild intermittent cough that is dry and she feels is related to reflux. She does have a moderate hiatal hernia on chest x-ray. If her symptoms of reflux and cough worsen, she may need to see gastroenterology/general surgery.  I suspect her hyperinflation is secondary to senile emphysema associated with her advanced age. There is really nothing to do from a pulmonary standpoint at this time.

## 2013-10-23 NOTE — Progress Notes (Signed)
   Subjective:    Patient ID: [name] [name], female    DOB: 02-09-1924, 80 y.o.   MRN: [id number]  HPI The patient is an 80 year old female who I've been asked to see for an abnormal chest x-ray. She had a recent film last month that showed hyperinflation, as well as minimal upper lobe scarring. She also had a moderate hiatal hernia that has been an ongoing issue for her. The patient had a questionable episode of pneumonia in the late summer, but I have reviewed the film and cannot say with any certainty that she does have a new infiltrate. She did tell me that she had a lot of pulmonary issues with cough, purulent mucus, and congestion. She was treated with antibiotics and returned to baseline. The patient has never smoked, and has excellent exertional tolerance with her activities of daily living. She tells me that she can walk significant distances without shortness of breath, but her legs "give out". She has only an intermittent dry cough that she feels is related to her reflux disease. She has never had spirometry.   Review of Systems  Constitutional: Negative for fever and unexpected weight change.  HENT: Negative for congestion, dental problem, ear pain, nosebleeds, postnasal drip, rhinorrhea, sinus pressure, sneezing, sore throat and trouble swallowing.   Eyes: Negative for redness and itching.  Respiratory: Negative for cough, chest tightness, shortness of breath and wheezing.   Cardiovascular: Negative for palpitations and leg swelling.  Gastrointestinal: Negative for nausea and vomiting.  Genitourinary: Negative for dysuria.  Musculoskeletal: Negative for joint swelling.  Skin: Negative for rash.  Neurological: Positive for headaches.  Hematological: Does not bruise/bleed easily.  Psychiatric/Behavioral: Positive for dysphoric mood. The patient is nervous/anxious.        Objective:   Physical Exam Constitutional:  Well developed, no acute distress  HENT:  Nares patent without  discharge  Oropharynx without exudate, palate and uvula are normal  Eyes:  Perrla, eomi, no scleral icterus  Neck:  No JVD, no TMG  Cardiovascular:  Normal rate, regular rhythm, no rubs or gallops.  1/6 sem        Intact distal pulses  Pulmonary :  Normal breath sounds, no stridor or respiratory distress   No rales, rhonchi, or wheezing  Abdominal:  Soft, nondistended, bowel sounds present.  No tenderness noted.   Musculoskeletal:  Mild ankle edema noted.  Lymph Nodes:  No cervical lymphadenopathy noted  Skin:  No cyanosis noted  Neurologic:  Alert, appropriate, moves all 4 extremities without obvious deficit.         Assessment & Plan:

## 2013-10-23 NOTE — Patient Instructions (Addendum)
Your breathing test is normal, and it sounds like you have excellent functional capacity. Your chest xray shows some early emphysema, but this is a natural part of aging and would not worry about.  followup with me as needed.

## 2013-10-30 ENCOUNTER — Ambulatory Visit (INDEPENDENT_AMBULATORY_CARE_PROVIDER_SITE_OTHER): Payer: Medicare Other | Admitting: *Deleted

## 2013-10-30 DIAGNOSIS — Z7901 Long term (current) use of anticoagulants: Secondary | ICD-10-CM

## 2013-10-30 DIAGNOSIS — I4891 Unspecified atrial fibrillation: Secondary | ICD-10-CM

## 2013-10-30 DIAGNOSIS — Z5181 Encounter for therapeutic drug level monitoring: Secondary | ICD-10-CM

## 2013-10-30 LAB — POCT INR: INR: 1.3

## 2013-11-03 ENCOUNTER — Telehealth: Payer: Self-pay

## 2013-11-03 MED ORDER — PANTOPRAZOLE SODIUM 40 MG PO TBEC: 40.0000 mg | DELAYED_RELEASE_TABLET | Freq: Every day | ORAL | Status: DC

## 2013-11-03 NOTE — Telephone Encounter (Signed)
Per md ok to change Nexium to Protonix 40 mg daily  RX sent and pt informed

## 2013-11-06 ENCOUNTER — Ambulatory Visit (INDEPENDENT_AMBULATORY_CARE_PROVIDER_SITE_OTHER): Payer: Medicare Other | Admitting: *Deleted

## 2013-11-06 DIAGNOSIS — Z5181 Encounter for therapeutic drug level monitoring: Secondary | ICD-10-CM

## 2013-11-06 DIAGNOSIS — Z7901 Long term (current) use of anticoagulants: Secondary | ICD-10-CM

## 2013-11-06 DIAGNOSIS — I4891 Unspecified atrial fibrillation: Secondary | ICD-10-CM

## 2013-11-06 LAB — POCT INR: INR: 2.2

## 2013-11-20 ENCOUNTER — Ambulatory Visit (INDEPENDENT_AMBULATORY_CARE_PROVIDER_SITE_OTHER): Payer: Medicare Other | Admitting: *Deleted

## 2013-11-20 DIAGNOSIS — I4891 Unspecified atrial fibrillation: Secondary | ICD-10-CM

## 2013-11-20 DIAGNOSIS — Z5181 Encounter for therapeutic drug level monitoring: Secondary | ICD-10-CM

## 2013-11-20 DIAGNOSIS — Z7901 Long term (current) use of anticoagulants: Secondary | ICD-10-CM

## 2013-11-20 LAB — POCT INR: INR: 2.2

## 2013-11-24 ENCOUNTER — Ambulatory Visit: Payer: Medicare Other | Admitting: Physician Assistant

## 2013-11-25 ENCOUNTER — Ambulatory Visit (INDEPENDENT_AMBULATORY_CARE_PROVIDER_SITE_OTHER): Payer: Medicare Other | Admitting: Medical

## 2013-11-25 ENCOUNTER — Encounter: Payer: Self-pay | Admitting: Medical

## 2013-11-25 ENCOUNTER — Other Ambulatory Visit: Payer: Self-pay

## 2013-11-25 VITALS — BP 163/79 | HR 68 | Temp 97.6°F | Ht 59.0 in | Wt 119.6 lb

## 2013-11-25 DIAGNOSIS — R351 Nocturia: Secondary | ICD-10-CM

## 2013-11-25 DIAGNOSIS — R82998 Other abnormal findings in urine: Secondary | ICD-10-CM | POA: Insufficient documentation

## 2013-11-25 DIAGNOSIS — N390 Urinary tract infection, site not specified: Secondary | ICD-10-CM | POA: Insufficient documentation

## 2013-11-25 DIAGNOSIS — N3000 Acute cystitis without hematuria: Secondary | ICD-10-CM

## 2013-11-25 LAB — POCT URINALYSIS DIPSTICK
Bilirubin, UA: NEGATIVE
Glucose, UA: NEGATIVE
Ketones, UA: NEGATIVE
Nitrite, UA: NEGATIVE
Spec Grav, UA: 1.01
Urobilinogen, UA: 0.2
pH, UA: 6

## 2013-11-25 MED ORDER — CEPHALEXIN 500 MG PO CAPS: 500.0000 mg | ORAL_CAPSULE | Freq: Three times a day (TID) | ORAL | Status: DC

## 2013-11-25 NOTE — Patient Instructions (Addendum)
You may have early uti based on your frequent urination and your ua results. We will do a culture and during the interim start you  Keflex for 3 days pending the culture.  Your very faint transient rt lower quadrant region pain is not present today. If this reoccurs and constant then ED evaluation. Based on your negative exam in this region today, I am not getting any labs or imaging studies. But if pain returns and constant that would be needed.  Follow up in 7 days or as needed  Pt educated she has a hiatal hernia on cxr. I don't see any other hernia on exam today.

## 2013-11-25 NOTE — Progress Notes (Signed)
Subjective:    Patient ID: [name] [name], female    DOB: 1924-11-05, 80 y.o.   MRN: [id number]  HPI   Pt states she was told she had hernia about one month ago. Pt told had hernia when pulmonologist saw her for pneumonia.  No fevers, no chills, no nausea or vomiting. On daily basis pt does not notice a bulge.  Pt just 2 days ago had some mild pain rt lower quadrant region. Did last for a few seconds and then resolved . Sometimes transient pain for seconds when lying down to sleep.  No pain recently when urinating. Pt does urinate sometimes more frequently. No hx of uti.  Pt does have her appendix.  Past Medical History  Diagnosis Date  . Paroxysmal a-fib   . Mitral and aortic regurgitation   . Mitral valve prolapse     hx of  . Hypertension   . Hyperlipidemia   . Vitamin D deficiency   . Pneumonia   . Tracheitis   . Gait difficulty   . Anemia   . Melena   . IBS (irritable bowel syndrome)   . Incontinence   . GERD (gastroesophageal reflux disease)   . Osteopenia   . Thyroid disease     hypo  . Personal history of colonic polyps   . Current use of long term anticoagulation   . Cough   . Diarrhea   . Pneumonia     organism unspecified  . Constipation   . UTI (lower urinary tract infection)   . Unspecified adverse effect of other drug, medicinal and biological substance(995.29)   . History of mammogram 2009  . Diverticulosis   . Hyperlipidemia   . Atrial fibrillation   . Arthritis     hips, knees  . Headache(784.0) 06/22/2012  . Renal insufficiency 06/28/2012  . Hyperkalemia 06/28/2012    History   Social History  . Marital Status: Widowed    Spouse Name: N/A    Number of Children: 3  . Years of Education: N/A   Occupational History  . retired    Social History Main Topics  . Smoking status: Never Smoker   . Smokeless tobacco: Never Used  . Alcohol Use: No  . Drug Use: No  . Sexual Activity: No   Other Topics Concern  . Not on file   Social  History Narrative    Past Surgical History  Procedure Laterality Date  . Breast biopsy Left   . Cardiac electrophysiology mapping and ablation    . Partial hysterectomy      ovaries left in place  . Cataract extraction, bilateral    . Esophagoscopy w/ botox injection    . Esophagogastroduodenoscopy N/A 09/03/2012    Procedure: ESOPHAGOGASTRODUODENOSCOPY (EGD);  Surgeon: Inda Castle, MD;  Location: [facility];  Service: Endoscopy;  Laterality: N/A;  . Botox injection N/A 09/03/2012    Procedure: BOTOX INJECTION;  Surgeon: Inda Castle, MD;  Location: [facility];  Service: Endoscopy;  Laterality: N/A;  . I&d extremity Right 11/06/2012    Procedure: IRRIGATION AND DEBRIDEMENT EXTREMITY Right Ring Finger;  Surgeon: Tennis Must, MD;  Location: [facility];  Service: Orthopedics;  Laterality: Right;    Family History  Problem Relation Age of Onset  . Diabetes Mother   . CVA Mother   . COPD Father   . Heart disease Maternal Aunt   . Heart disease Maternal Uncle   . Allergies Daughter   . COPD Daughter   .  Stroke Daughter   . COPD Daughter     previous smoker  . Stroke Maternal Grandfather   . Rheum arthritis Father     Allergies  Allergen Reactions  . Darifenacin Hydrobromide     REACTION: causes her dizziness  . Sulfonamide Derivatives   . Tramadol Other (See Comments)    Insomnia, anorexia    Current Outpatient Prescriptions on File Prior to Visit  Medication Sig Dispense Refill  . ALPRAZolam (XANAX) 0.25 MG tablet TAKE 1 TABLET BY MOUTH 3 TIMES DAILY AS NEEDED FOR ANXIETY. 90 tablet 1  . atorvastatin (LIPITOR) 10 MG tablet TAKE 1 TABLET (10 MG TOTAL) BY MOUTH DAILY AT 6 PM. 30 tablet 2  . Cholecalciferol (VITAMIN D) 2000 UNITS CAPS Take 2,000 Units by mouth every other day.    . fluticasone (FLONASE) 50 MCG/ACT nasal spray PLACE 2 SPRAYS INTO BOTH NOSTRILS DAILY. 16 g 6  . metoprolol tartrate (LOPRESSOR) 25 MG tablet Take 0.5 tablets (12.5 mg total) by mouth 2 (two)  times daily. bid 90 tablet 3  . pantoprazole (PROTONIX) 40 MG tablet Take 1 tablet (40 mg total) by mouth daily. 30 tablet 3  . ramipril (ALTACE) 10 MG capsule Take 1 capsule (10 mg total) by mouth daily. 90 capsule 3  . vitamin B-12 (CYANOCOBALAMIN) 1000 MCG tablet Take 1 tablet (1,000 mcg total) by mouth daily.    Marland Kitchen warfarin (COUMADIN) 2.5 MG tablet Take as directed by Anticoagulation clinic 40 tablet 3  . albuterol (PROVENTIL HFA;VENTOLIN HFA) 108 (90 BASE) MCG/ACT inhaler Inhale 1-2 puffs into the lungs every 6 (six) hours as needed for wheezing. (Patient not taking: Reported on 11/25/2013) 1 Inhaler 0   No current facility-administered medications on file prior to visit.    BP 163/79 mmHg  Pulse 68  Temp(Src) 97.6 F (36.4 C) (Oral)  Ht 4\' 11"  (1.499 m)  Wt 119 lb 9.6 oz (54.25 kg)  BMI 24.14 kg/m2  SpO2 96%      Review of Systems  Constitutional: Negative for fever, chills and fatigue.  HENT: Negative for congestion, ear discharge, ear pain, nosebleeds, postnasal drip, rhinorrhea, sinus pressure, sore throat and trouble swallowing.   Respiratory: Negative for cough, chest tightness, shortness of breath and wheezing.   Cardiovascular: Negative for chest pain and palpitations.  Gastrointestinal: Positive for abdominal pain. Negative for nausea, vomiting, diarrhea and constipation.       Rare transient see hpi. None now.  Genitourinary: Positive for frequency. Negative for dysuria and flank pain.  Musculoskeletal: Negative for back pain.  Neurological: Negative for dizziness, tremors, seizures, syncope, weakness, light-headedness, numbness and headaches.  Hematological: Negative for adenopathy. Does not bruise/bleed easily.  Psychiatric/Behavioral: Negative for suicidal ideas, behavioral problems and dysphoric mood. The patient is not nervous/anxious.        Objective:   Physical Exam  General Appearance- Not in acute distress.  HEENT Eyes- Scleraeral/Conjuntiva-bilat-  Not Yellow. Mouth & Throat- Normal.  Chest and Lung Exam Auscultation: Breath sounds:-Normal. Adventitious sounds:- No Adventitious sounds.  Cardiovascular Auscultation:Rythm - Regular. Heart Sounds -Normal heart sounds.  Abdomen Inspection:-Inspection Normal.  Palpation/Perucssion: Palpation and Percussion of the abdomen reveal- Non Tender, No Rebound tenderness, No rigidity(Guarding) and No Palpable abdominal masses.  Liver:-Normal.  Spleen:- Normal.   No hernia on inspection or palpation. On straight leg lift no inguinal or groin buldges  Rt lower quadrant- no heal jar pain or obturator sign.  Back- no cva pain.  Assessment & Plan:

## 2013-11-25 NOTE — Progress Notes (Signed)
Pre visit review using our clinic review tool, if applicable. No additional management support is needed unless otherwise documented below in the visit note. 

## 2013-11-25 NOTE — Assessment & Plan Note (Signed)
You may have early uti based on your frequent urination and your ua results. We will do a culture and during the interim start you  Keflex for 3 days pending the culture.

## 2013-11-27 LAB — URINE CULTURE: Colony Count: 25000

## 2013-12-03 ENCOUNTER — Ambulatory Visit (INDEPENDENT_AMBULATORY_CARE_PROVIDER_SITE_OTHER): Payer: Medicare Other | Admitting: Medical

## 2013-12-03 ENCOUNTER — Encounter: Payer: Self-pay | Admitting: Medical

## 2013-12-03 VITALS — BP 160/80 | HR 65 | Temp 98.3°F | Ht 59.0 in | Wt 120.0 lb

## 2013-12-03 DIAGNOSIS — N390 Urinary tract infection, site not specified: Secondary | ICD-10-CM | POA: Insufficient documentation

## 2013-12-03 DIAGNOSIS — K625 Hemorrhage of anus and rectum: Secondary | ICD-10-CM | POA: Insufficient documentation

## 2013-12-03 DIAGNOSIS — Z87440 Personal history of urinary (tract) infections: Secondary | ICD-10-CM

## 2013-12-03 DIAGNOSIS — K922 Gastrointestinal hemorrhage, unspecified: Secondary | ICD-10-CM | POA: Insufficient documentation

## 2013-12-03 DIAGNOSIS — R82998 Other abnormal findings in urine: Secondary | ICD-10-CM

## 2013-12-03 DIAGNOSIS — N819 Female genital prolapse, unspecified: Secondary | ICD-10-CM

## 2013-12-03 LAB — CBC WITH DIFFERENTIAL/PLATELET
Basophils Absolute: 0 10*3/uL (ref 0.0–0.1)
Basophils Relative: 0.3 % (ref 0.0–3.0)
EOS PCT: 0.4 % (ref 0.0–5.0)
Eosinophils Absolute: 0 10*3/uL (ref 0.0–0.7)
HCT: 36.1 % (ref 36.0–46.0)
Hemoglobin: 12.2 g/dL (ref 12.0–15.0)
Lymphocytes Relative: 11.9 % — ABNORMAL LOW (ref 12.0–46.0)
Lymphs Abs: 1.2 10*3/uL (ref 0.7–4.0)
MCHC: 33.9 g/dL (ref 30.0–36.0)
MCV: 91.1 fl (ref 78.0–100.0)
MONOS PCT: 5.1 % (ref 3.0–12.0)
Monocytes Absolute: 0.5 10*3/uL (ref 0.1–1.0)
NEUTROS PCT: 82.3 % — AB (ref 43.0–77.0)
Neutro Abs: 8.5 10*3/uL — ABNORMAL HIGH (ref 1.4–7.7)
Platelets: 173 10*3/uL (ref 150.0–400.0)
RBC: 3.96 Mil/uL (ref 3.87–5.11)
RDW: 14.2 % (ref 11.5–15.5)
WBC: 10.3 10*3/uL (ref 4.0–10.5)

## 2013-12-03 LAB — POCT URINALYSIS DIPSTICK
BILIRUBIN UA: NEGATIVE
Blood, UA: 6
Glucose, UA: NEGATIVE
KETONES UA: NEGATIVE
Nitrite, UA: NEGATIVE
Protein, UA: NEGATIVE
SPEC GRAV UA: 1.025
Urobilinogen, UA: 4
pH, UA: 6

## 2013-12-03 NOTE — Patient Instructions (Addendum)
Your describe blood that could be from the rectal region. I am going to get a cbc today and refer to GI(morning 9 am or later)for further work up. Your hemoccult card test negative today.  I referred pt to gyn for female pelvic organ prolapse.   I want you to start bp log daily and call me with the readings after 1 wk. Your bp varies but if like it is today at home will adjust bp meds. If any neuro or cardiac symptoms then ED eval.  We will repeat your urine culture today.  Follow up 2 wks or as needed.

## 2013-12-03 NOTE — Assessment & Plan Note (Addendum)
Above was the  plan for possible rectal bleed(which  I deleted). I referred pt to Gyn for evaluation regarding prolapase of female pelvic organs.

## 2013-12-03 NOTE — Assessment & Plan Note (Signed)
Your describe blood that could be from the rectal region. I am going to get a cbc today and refer to GI(morning 9 am or later)for further work up. Your hemoccult card test negative today.

## 2013-12-03 NOTE — Progress Notes (Addendum)
   Subjective:    Patient ID: [name] [name], female    DOB: [birth year], 80 y.o.   MRN: [id number]  HPI   Pt states that she saw some blood on toilet and shower floor. She thought was from the  rectocele. Some blood on the toilet cover. Also some blood in the water on shower floor. Scant minimal amounts seen. Occasional spec of blood in her undergarment very rare. Pt had partial hysterecomy. No  Known rectal bleeding or hisory of. Pt not sure where this blood is coming from.  Pt has no further urinary symptoms.    Bp is high. No cardiac or neurologic signs or symptoms.    Review of Systems See above    Objective:   Physical Exam   General Appearance- Not in acute distress.  HEENT Eyes- Scleraeral/Conjuntiva-bilat- Not Yellow. Mouth & Throat- Normal.  Chest and Lung Exam Auscultation: Breath sounds:-Normal. Adventitious sounds:- No Adventitious sounds.  Cardiovascular Auscultation:Rythm - Regular. Heart Sounds -Normal heart sounds.  Abdomen Inspection:-Inspection Normal.  Palpation/Perucssion: Palpation and Percussion of the abdomen reveal- Non Tender, No Rebound tenderness, No rigidity(Guarding) and No Palpable abdominal masses.  Liver:-Normal.  Spleen:- Normal.   Rectal Anorectal Exam: Stool - Hemoccult of stool/mucous is Heme Negative. External - normal external exam. Internal - normal sphincter tone. No rectal mass.  Vaginal exam- pelvic organ prolpase(pt reports rectocele. 0        Assessment & Plan:

## 2013-12-03 NOTE — Progress Notes (Signed)
Pre visit review using our clinic review tool, if applicable. No additional management support is needed unless otherwise documented below in the visit note. 

## 2013-12-04 ENCOUNTER — Encounter: Payer: Self-pay | Admitting: Gastroenterology

## 2013-12-07 ENCOUNTER — Other Ambulatory Visit: Payer: Self-pay

## 2013-12-07 LAB — URINE CULTURE

## 2013-12-08 ENCOUNTER — Other Ambulatory Visit: Payer: Self-pay

## 2013-12-08 MED ORDER — CIPROFLOXACIN HCL 250 MG PO TABS: 250.0000 mg | ORAL_TABLET | Freq: Two times a day (BID) | ORAL | Status: DC

## 2013-12-09 ENCOUNTER — Telehealth: Payer: Self-pay | Admitting: Family Medicine

## 2013-12-09 ENCOUNTER — Telehealth: Payer: Self-pay | Admitting: Cardiology

## 2013-12-09 NOTE — Telephone Encounter (Signed)
Pt starting Cipro 250mg s BID on today, has an appt on Friday due to dose adjustment, thus instructed her we will see her then due to dose change but it might be too soon for ABX start.

## 2013-12-09 NOTE — Telephone Encounter (Signed)
Caller name: Taylia Relation to pt: self Call back number:  [phone] Pharmacy:  Reason for call:   Patient states that her bp   fri 146/86 Sat 150/71  Sun 153/89 mon 148/74 tues 141/71 Wed 140/67

## 2013-12-09 NOTE — Telephone Encounter (Signed)
New Message        Pt calling stating she has questions about some medication. Please call back and advise.

## 2013-12-11 ENCOUNTER — Ambulatory Visit (INDEPENDENT_AMBULATORY_CARE_PROVIDER_SITE_OTHER): Payer: Medicare Other | Admitting: *Deleted

## 2013-12-11 DIAGNOSIS — Z7901 Long term (current) use of anticoagulants: Secondary | ICD-10-CM

## 2013-12-11 DIAGNOSIS — Z5181 Encounter for therapeutic drug level monitoring: Secondary | ICD-10-CM

## 2013-12-11 DIAGNOSIS — I4891 Unspecified atrial fibrillation: Secondary | ICD-10-CM

## 2013-12-11 LAB — POCT INR: INR: 2.6

## 2013-12-11 NOTE — Telephone Encounter (Signed)
Pt notified and made aware of recommendations.  Pt stated understanding.  No questions or concerns at this time.

## 2013-12-11 NOTE — Telephone Encounter (Signed)
Pt bp is ok. I won't make any changes now. If I were to increase beta blocker then her pulse may drop below 60. Her kidney function is little decreased so I don't want to increase her ace. Will get staff to advise continue her current meds and continue to check bp 3 times a week and document pulse readings as well. If her systolics jump back over 258 consistently then would need to make adjustments. At her age sometimes we overtreat htn and patients have worse effects from meds or hypotension.

## 2013-12-15 ENCOUNTER — Other Ambulatory Visit: Payer: Self-pay | Admitting: Family Medicine

## 2013-12-16 ENCOUNTER — Other Ambulatory Visit: Payer: Self-pay | Admitting: Family Medicine

## 2013-12-16 NOTE — Telephone Encounter (Signed)
Request Alprazolam 0.25 No Contract or UDS on file Last OV with PCP 10/2013 Last acute OV 12/2013 Last refill 10/19/2013 #90 with 1 refill   Please advise

## 2013-12-17 NOTE — Telephone Encounter (Signed)
Rx printed and faxed to the pharmacy ([facility]).//AB/CMA

## 2013-12-20 ENCOUNTER — Emergency Department (HOSPITAL_BASED_OUTPATIENT_CLINIC_OR_DEPARTMENT_OTHER): Payer: Medicare Other

## 2013-12-20 ENCOUNTER — Encounter (HOSPITAL_BASED_OUTPATIENT_CLINIC_OR_DEPARTMENT_OTHER): Payer: Self-pay | Admitting: *Deleted

## 2013-12-20 ENCOUNTER — Emergency Department (HOSPITAL_BASED_OUTPATIENT_CLINIC_OR_DEPARTMENT_OTHER)
Admission: EM | Admit: 2013-12-20 | Discharge: 2013-12-20 | Disposition: A | Discharge status: Home / Self Care | Payer: Medicare Other | Attending: Emergency Medicine | Admitting: Emergency Medicine

## 2013-12-20 DIAGNOSIS — Z87448 Personal history of other diseases of urinary system: Secondary | ICD-10-CM | POA: Diagnosis not present

## 2013-12-20 DIAGNOSIS — Z79899 Other long term (current) drug therapy: Secondary | ICD-10-CM | POA: Insufficient documentation

## 2013-12-20 DIAGNOSIS — S83412A Sprain of medial collateral ligament of left knee, initial encounter: Secondary | ICD-10-CM | POA: Diagnosis not present

## 2013-12-20 DIAGNOSIS — I480 Paroxysmal atrial fibrillation: Secondary | ICD-10-CM | POA: Diagnosis not present

## 2013-12-20 DIAGNOSIS — Z87440 Personal history of urinary (tract) infections: Secondary | ICD-10-CM | POA: Diagnosis not present

## 2013-12-20 DIAGNOSIS — X58XXXA Exposure to other specified factors, initial encounter: Secondary | ICD-10-CM | POA: Diagnosis not present

## 2013-12-20 DIAGNOSIS — Z8701 Personal history of pneumonia (recurrent): Secondary | ICD-10-CM | POA: Diagnosis not present

## 2013-12-20 DIAGNOSIS — Z862 Personal history of diseases of the blood and blood-forming organs and certain disorders involving the immune mechanism: Secondary | ICD-10-CM | POA: Diagnosis not present

## 2013-12-20 DIAGNOSIS — Y9389 Activity, other specified: Secondary | ICD-10-CM | POA: Insufficient documentation

## 2013-12-20 DIAGNOSIS — Z7951 Long term (current) use of inhaled steroids: Secondary | ICD-10-CM | POA: Diagnosis not present

## 2013-12-20 DIAGNOSIS — G8929 Other chronic pain: Secondary | ICD-10-CM | POA: Diagnosis not present

## 2013-12-20 DIAGNOSIS — E785 Hyperlipidemia, unspecified: Secondary | ICD-10-CM | POA: Insufficient documentation

## 2013-12-20 DIAGNOSIS — E559 Vitamin D deficiency, unspecified: Secondary | ICD-10-CM | POA: Insufficient documentation

## 2013-12-20 DIAGNOSIS — Z86010 Personal history of colonic polyps: Secondary | ICD-10-CM | POA: Diagnosis not present

## 2013-12-20 DIAGNOSIS — I10 Essential (primary) hypertension: Secondary | ICD-10-CM | POA: Insufficient documentation

## 2013-12-20 DIAGNOSIS — Y998 Other external cause status: Secondary | ICD-10-CM | POA: Diagnosis not present

## 2013-12-20 DIAGNOSIS — Z792 Long term (current) use of antibiotics: Secondary | ICD-10-CM | POA: Diagnosis not present

## 2013-12-20 DIAGNOSIS — K219 Gastro-esophageal reflux disease without esophagitis: Secondary | ICD-10-CM | POA: Insufficient documentation

## 2013-12-20 DIAGNOSIS — Z7901 Long term (current) use of anticoagulants: Secondary | ICD-10-CM | POA: Diagnosis not present

## 2013-12-20 DIAGNOSIS — Y9289 Other specified places as the place of occurrence of the external cause: Secondary | ICD-10-CM | POA: Diagnosis not present

## 2013-12-20 DIAGNOSIS — M25562 Pain in left knee: Secondary | ICD-10-CM | POA: Diagnosis present

## 2013-12-20 DIAGNOSIS — M1990 Unspecified osteoarthritis, unspecified site: Secondary | ICD-10-CM | POA: Insufficient documentation

## 2013-12-20 DIAGNOSIS — R52 Pain, unspecified: Secondary | ICD-10-CM

## 2013-12-20 MED ORDER — HYDROCODONE-ACETAMINOPHEN 5-325 MG PO TABS: 0.5000 | ORAL_TABLET | ORAL | Status: DC | PRN

## 2013-12-20 MED ORDER — HYDROCODONE-ACETAMINOPHEN 5-325 MG PO TABS
1.0000 | ORAL_TABLET | Freq: Once | ORAL | Status: AC
  Administered 2013-12-20: 1 via ORAL
  Filled 2013-12-20: qty 1

## 2013-12-20 NOTE — ED Provider Notes (Signed)
CSN: [id number]     Arrival date & time 12/20/13  2058 History  This chart was scribed for No att. providers found by [facility], ED Scribe. The patient was seen in [place] and the patient's care was started at 11:08 PM.  Chief Complaint  Patient presents with  . Knee Pain   HPI  HPI Comments: [name] [name] is a 80 y.o. female who presents to the Emergency Department complaining of left knee pain onset 1 day ago. The pain is located medially. She denies trauma. Movement and bearing weight exacerbates the pain. Pain is also worse with palpation and she rates is as moderate to severe. There is no associated redness, swelling or deformity.   Pt has chronic right groin pain that is followed by her gynecologist.  Past Medical History  Diagnosis Date  . Paroxysmal a-fib   . Mitral and aortic regurgitation   . Mitral valve prolapse     hx of  . Hypertension   . Hyperlipidemia   . Vitamin D deficiency   . Pneumonia   . Tracheitis   . Gait difficulty   . Anemia   . Melena   . IBS (irritable bowel syndrome)   . Incontinence   . GERD (gastroesophageal reflux disease)   . Osteopenia   . Thyroid disease     hypo  . Personal history of colonic polyps   . Current use of long term anticoagulation   . Cough   . Diarrhea   . Pneumonia     organism unspecified  . Constipation   . UTI (lower urinary tract infection)   . Unspecified adverse effect of other drug, medicinal and biological substance(995.29)   . History of mammogram 2009  . Diverticulosis   . Hyperlipidemia   . Atrial fibrillation   . Arthritis     hips, knees  . Headache(784.0) 06/22/2012  . Renal insufficiency 06/28/2012  . Hyperkalemia 06/28/2012   Past Surgical History  Procedure Laterality Date  . Breast biopsy Left   . Cardiac electrophysiology mapping and ablation    . Partial hysterectomy      ovaries left in place  . Cataract extraction, bilateral    . Esophagoscopy w/ botox injection    .  Esophagogastroduodenoscopy N/A 09/03/2012    Procedure: ESOPHAGOGASTRODUODENOSCOPY (EGD);  Surgeon: Inda Castle, MD;  Location: [facility];  Service: Endoscopy;  Laterality: N/A;  . Botox injection N/A 09/03/2012    Procedure: BOTOX INJECTION;  Surgeon: Inda Castle, MD;  Location: [facility];  Service: Endoscopy;  Laterality: N/A;  . I&d extremity Right 11/06/2012    Procedure: IRRIGATION AND DEBRIDEMENT EXTREMITY Right Ring Finger;  Surgeon: Tennis Must, MD;  Location: [facility];  Service: Orthopedics;  Laterality: Right;   Family History  Problem Relation Age of Onset  . Diabetes Mother   . CVA Mother   . COPD Father   . Heart disease Maternal Aunt   . Heart disease Maternal Uncle   . Allergies Daughter   . COPD Daughter   . Stroke Daughter   . COPD Daughter     previous smoker  . Stroke Maternal Grandfather   . Rheum arthritis Father    History  Substance Use Topics  . Smoking status: Never Smoker   . Smokeless tobacco: Never Used  . Alcohol Use: No   OB History    No data available     Review of Systems  Musculoskeletal: Positive for joint swelling, arthralgias and gait  problem.  All other systems reviewed and are negative.  Allergies  Darifenacin hydrobromide; Sulfonamide derivatives; and Tramadol  Home Medications   Prior to Admission medications   Medication Sig Start Date End Date Taking? Authorizing Provider  albuterol (PROVENTIL HFA;VENTOLIN HFA) 108 (90 BASE) MCG/ACT inhaler Inhale 1-2 puffs into the lungs every 6 (six) hours as needed for wheezing. 08/30/13   Tanna Furry, MD  ALPRAZolam Duanne Moron) 0.25 MG tablet TAKE 1 TABLET BY MOUTH 3 TIMES DAILY AS NEEDED FOR ANXIETY 12/16/13   Mosie Lukes, MD  atorvastatin (LIPITOR) 10 MG tablet TAKE 1 TABLET (10 MG TOTAL) BY MOUTH DAILY AT 6 PM. 12/16/13   Colon Branch, MD  cephALEXin (KEFLEX) 500 MG capsule Take 1 capsule (500 mg total) by mouth 3 (three) times daily. 11/25/13   Meriam Sprague Saguier, PA-C  Cholecalciferol  (VITAMIN D) 2000 UNITS CAPS Take 2,000 Units by mouth every other day. 02/20/12   Mosie Lukes, MD  ciprofloxacin (CIPRO) 250 MG tablet Take 1 tablet (250 mg total) by mouth 2 (two) times daily. Take 1 tablet twice daily for 7 days. 12/08/13   [place], PA-C  fluticasone (FLONASE) 50 MCG/ACT nasal spray PLACE 2 SPRAYS INTO BOTH NOSTRILS DAILY. 09/12/13   Brunetta Jeans, PA-C  HYDROcodone-acetaminophen (NORCO/VICODIN) 5-325 MG per tablet Take 0.5-1 tablets by mouth every 4 (four) hours as needed (for pain; may cause constipation). 12/20/13   Karen Chafe Biana Haggar, MD  metoprolol tartrate (LOPRESSOR) 25 MG tablet Take 0.5 tablets (12.5 mg total) by mouth 2 (two) times daily. bid 01/30/13   Mosie Lukes, MD  pantoprazole (PROTONIX) 40 MG tablet Take 1 tablet (40 mg total) by mouth daily. 11/03/13   Mosie Lukes, MD  ramipril (ALTACE) 10 MG capsule Take 1 capsule (10 mg total) by mouth daily. 01/30/13   Mosie Lukes, MD  vitamin B-12 (CYANOCOBALAMIN) 1000 MCG tablet Take 1 tablet (1,000 mcg total) by mouth daily. 02/20/12   Mosie Lukes, MD  warfarin (COUMADIN) 2.5 MG tablet Take as directed by Anticoagulation clinic 09/16/13   Dorothy Spark, MD   BP 152/57 mmHg  Pulse 64  Temp(Src) 97.7 F (36.5 C) (Oral)  Resp 18  Ht 4\' 11"  (1.499 m)  Wt 121 lb (54.885 kg)  BMI 24.43 kg/m2  SpO2 97%   Physical Exam General: Well-developed, well-nourished female in no acute distress; appearance consistent with age of record HENT: normocephalic; atraumatic Eyes: pupils equal, round and reactive to light; extraocular muscles intact Neck: supple Heart: regular rate and irregular rhythm. Lungs: clear to auscultation bilaterally Abdomen: soft; nondistended; nontender; no masses or hepatosplenomegaly; bowel sounds present Extremities: Arthritic changes, tenderness over the left medial collateral ligament without swelling, erythema or warmth; negative left posterior and anterior drawer test; pain with left  medial collateral ligament stress; pulses normal.  Neurologic: Awake, alert and oriented; motor function intact in all extremities and symmetric; no facial droop Skin: Warm and dry Psychiatric: Normal mood and affect  ED Course  Procedures  DIAGNOSTIC STUDIES: Oxygen Saturation is 96% on room air, normal by my interpretation.    COORDINATION OF CARE: 11:14 PM Discussed treatment plan with pt at bedside and pt agreed to plan.   MDM   Final diagnoses:  Pain  Medial collateral ligament sprain of knee, left, initial encounter   I personally performed the services described in this documentation, which was scribed in my presence. The recorded information has been reviewed and is accurate.  Wynetta Fines, MD 12/21/13 [phone]

## 2013-12-20 NOTE — ED Notes (Addendum)
Pt c/o left knee pain x 24 hrs w/o injury.  Pulse present and marked

## 2014-01-08 ENCOUNTER — Ambulatory Visit (INDEPENDENT_AMBULATORY_CARE_PROVIDER_SITE_OTHER): Payer: Medicare Other

## 2014-01-08 DIAGNOSIS — I4891 Unspecified atrial fibrillation: Secondary | ICD-10-CM

## 2014-01-08 DIAGNOSIS — Z7901 Long term (current) use of anticoagulants: Secondary | ICD-10-CM

## 2014-01-08 DIAGNOSIS — Z5181 Encounter for therapeutic drug level monitoring: Secondary | ICD-10-CM

## 2014-01-08 LAB — POCT INR: INR: 4

## 2014-01-18 ENCOUNTER — Other Ambulatory Visit: Payer: Self-pay | Admitting: Cardiology

## 2014-01-27 ENCOUNTER — Ambulatory Visit (INDEPENDENT_AMBULATORY_CARE_PROVIDER_SITE_OTHER): Payer: Medicare Other | Admitting: Surgery

## 2014-01-27 DIAGNOSIS — I4891 Unspecified atrial fibrillation: Secondary | ICD-10-CM

## 2014-01-27 DIAGNOSIS — Z7901 Long term (current) use of anticoagulants: Secondary | ICD-10-CM

## 2014-01-27 DIAGNOSIS — Z5181 Encounter for therapeutic drug level monitoring: Secondary | ICD-10-CM

## 2014-01-27 LAB — POCT INR: INR: 3.7

## 2014-02-03 ENCOUNTER — Encounter: Payer: Self-pay | Admitting: Gastroenterology

## 2014-02-03 ENCOUNTER — Ambulatory Visit (INDEPENDENT_AMBULATORY_CARE_PROVIDER_SITE_OTHER): Payer: Medicare Other | Admitting: Gastroenterology

## 2014-02-03 VITALS — BP 160/64 | HR 68 | Ht 58.5 in | Wt 118.4 lb

## 2014-02-03 DIAGNOSIS — R1031 Right lower quadrant pain: Secondary | ICD-10-CM

## 2014-02-03 DIAGNOSIS — G8929 Other chronic pain: Secondary | ICD-10-CM | POA: Insufficient documentation

## 2014-02-03 NOTE — Assessment & Plan Note (Signed)
Patient has very intermittent, low-grade abdominal discomfort without alarm symptoms.  Exam is unremarkable.  I think it is unlikely that she has an active intra-abdominal process.  At this time would not pursue any further.  Patient was carefully instructed to contact me should symptoms worsen.

## 2014-02-03 NOTE — Patient Instructions (Signed)
Follow up as needed

## 2014-02-03 NOTE — Progress Notes (Signed)
_                                                                                                                History of Present Illness:  [name] [name] is an 81 year old white female here for evaluation of abdominal pain.  Over the last couple months she's had intermittent, dull right lower quadrant pain lasting minutes at a time.  It is unrelated to eating or bowel movements.  She denies change of bowel habits.  She's had minimal bleeding which she attributes to a rectocele.  She is on Coumadin.  Weight has been stable.   Past Medical History  Diagnosis Date  . Paroxysmal a-fib   . Mitral and aortic regurgitation   . Mitral valve prolapse     hx of  . Hypertension   . Hyperlipidemia   . Vitamin D deficiency   . Pneumonia   . Tracheitis   . Gait difficulty   . Anemia   . Melena   . IBS (irritable bowel syndrome)   . Incontinence   . GERD (gastroesophageal reflux disease)   . Osteopenia   . Thyroid disease     hypo  . Personal history of colonic polyps   . Current use of long term anticoagulation   . Cough   . Diarrhea   . Pneumonia     organism unspecified  . Constipation   . UTI (lower urinary tract infection)   . Unspecified adverse effect of other drug, medicinal and biological substance(995.29)   . History of mammogram 2009  . Diverticulosis   . Hyperlipidemia   . Atrial fibrillation   . Arthritis     hips, knees  . Headache(784.0) 06/22/2012  . Renal insufficiency 06/28/2012  . Hyperkalemia 06/28/2012  . Anxiety   . Hypertension    Past Surgical History  Procedure Laterality Date  . Breast biopsy Left   . Cardiac electrophysiology mapping and ablation    . Partial hysterectomy      ovaries left in place  . Cataract extraction, bilateral    . Esophagoscopy w/ botox injection    . Esophagogastroduodenoscopy N/A 09/03/2012    Procedure: ESOPHAGOGASTRODUODENOSCOPY (EGD);  Surgeon: Inda Castle, MD;  Location: [facility];  Service:  Endoscopy;  Laterality: N/A;  . Botox injection N/A 09/03/2012    Procedure: BOTOX INJECTION;  Surgeon: Inda Castle, MD;  Location: [facility];  Service: Endoscopy;  Laterality: N/A;  . I&d extremity Right 11/06/2012    Procedure: IRRIGATION AND DEBRIDEMENT EXTREMITY Right Ring Finger;  Surgeon: Tennis Must, MD;  Location: [facility];  Service: Orthopedics;  Laterality: Right;   family history includes Allergies in her daughter; COPD in her daughter, daughter, and father; CVA in her mother; Diabetes in her mother; Heart disease in her maternal aunt and maternal uncle; Rheum arthritis in her father; Stroke in her daughter and maternal grandfather. There is no history of Colon cancer, Colon polyps, Esophageal cancer, Gallbladder disease, or Kidney disease. Current Outpatient Prescriptions  Medication  Sig Dispense Refill  . ALPRAZolam (XANAX) 0.25 MG tablet TAKE 1 TABLET BY MOUTH 3 TIMES DAILY AS NEEDED FOR ANXIETY 90 tablet 1  . atorvastatin (LIPITOR) 10 MG tablet TAKE 1 TABLET (10 MG TOTAL) BY MOUTH DAILY AT 6 PM. 30 tablet 2  . Cholecalciferol (VITAMIN D) 2000 UNITS CAPS Take 2,000 Units by mouth every other day.    . fluticasone (FLONASE) 50 MCG/ACT nasal spray PLACE 2 SPRAYS INTO BOTH NOSTRILS DAILY. 16 g 6  . metoprolol tartrate (LOPRESSOR) 25 MG tablet Take 0.5 tablets (12.5 mg total) by mouth 2 (two) times daily. bid 90 tablet 3  . pantoprazole (PROTONIX) 40 MG tablet Take 1 tablet (40 mg total) by mouth daily. 30 tablet 3  . ramipril (ALTACE) 10 MG capsule Take 1 capsule (10 mg total) by mouth daily. 90 capsule 3  . vitamin B-12 (CYANOCOBALAMIN) 1000 MCG tablet Take 1 tablet (1,000 mcg total) by mouth daily.    Marland Kitchen warfarin (COUMADIN) 2.5 MG tablet TAKE AS DIRECTED BY ANTICOAGULATION CLINIC 40 tablet 3   No current facility-administered medications for this visit.   Allergies as of 02/03/2014 - Review Complete 02/03/2014  Allergen Reaction Noted  . Darifenacin hydrobromide    .  Sulfonamide derivatives  01/29/2007  . Tramadol Other (See Comments) 11/13/2010    reports that she has never smoked. She has never used smokeless tobacco. She reports that she does not drink alcohol or use illicit drugs.   Review of Systems: Pertinent positive and negative review of systems were noted in the above HPI section. All other review of systems were otherwise negative.  Vital signs were reviewed in today's medical record Physical Exam: General: Well developed , well nourished, no acute distress Skin: anicteric Head: Normocephalic and atraumatic Eyes:  sclerae anicteric, EOMI Ears: Normal auditory acuity Mouth: No deformity or lesions Neck: Supple, no masses or thyromegaly Lungs: Clear throughout to auscultation Heart: Regular rate and rhythm; no murmurs, rubs or bruits Abdomen: Soft, non tender and non distended. No masses, hepatosplenomegaly or hernias noted. Normal Bowel sounds Rectal:deferred Musculoskeletal: Symmetrical with no gross deformities  Skin: No lesions on visible extremities Pulses:  Normal pulses noted Extremities: No clubbing, cyanosis, edema or deformities noted Neurological: Alert oriented x 4, grossly nonfocal Cervical Nodes:  No significant cervical adenopathy Inguinal Nodes: No significant inguinal adenopathy Psychological:  Alert and cooperative. Normal mood and affect  See Assessment and Plan under Problem List

## 2014-02-05 ENCOUNTER — Ambulatory Visit (INDEPENDENT_AMBULATORY_CARE_PROVIDER_SITE_OTHER): Payer: Medicare Other | Admitting: Family Medicine

## 2014-02-05 ENCOUNTER — Encounter: Payer: Self-pay | Admitting: Family Medicine

## 2014-02-05 VITALS — BP 129/68 | HR 79 | Temp 98.0°F | Ht 59.0 in | Wt 118.0 lb

## 2014-02-05 DIAGNOSIS — E039 Hypothyroidism, unspecified: Secondary | ICD-10-CM

## 2014-02-05 DIAGNOSIS — K219 Gastro-esophageal reflux disease without esophagitis: Secondary | ICD-10-CM

## 2014-02-05 DIAGNOSIS — R001 Bradycardia, unspecified: Secondary | ICD-10-CM

## 2014-02-05 DIAGNOSIS — I10 Essential (primary) hypertension: Secondary | ICD-10-CM

## 2014-02-05 DIAGNOSIS — E782 Mixed hyperlipidemia: Secondary | ICD-10-CM

## 2014-02-05 DIAGNOSIS — Z0000 Encounter for general adult medical examination without abnormal findings: Secondary | ICD-10-CM

## 2014-02-05 DIAGNOSIS — N390 Urinary tract infection, site not specified: Secondary | ICD-10-CM

## 2014-02-05 HISTORY — DX: Encounter for general adult medical examination without abnormal findings: Z00.00

## 2014-02-05 LAB — CBC
HEMATOCRIT: 36.8 % (ref 36.0–46.0)
Hemoglobin: 12.3 g/dL (ref 12.0–15.0)
MCH: 30.1 pg (ref 26.0–34.0)
MCHC: 33.4 g/dL (ref 30.0–36.0)
MCV: 90.2 fL (ref 78.0–100.0)
MPV: 11.3 fL (ref 8.6–12.4)
PLATELETS: 209 10*3/uL (ref 150–400)
RBC: 4.08 MIL/uL (ref 3.87–5.11)
RDW: 14.3 % (ref 11.5–15.5)
WBC: 5.3 10*3/uL (ref 4.0–10.5)

## 2014-02-05 LAB — COMPREHENSIVE METABOLIC PANEL
ALBUMIN: 3.7 g/dL (ref 3.5–5.2)
ALK PHOS: 16 U/L — AB (ref 39–117)
ALT: 9 U/L (ref 0–35)
AST: 15 U/L (ref 0–37)
BUN: 9 mg/dL (ref 6–23)
CO2: 28 meq/L (ref 19–32)
Calcium: 8.8 mg/dL (ref 8.4–10.5)
Chloride: 102 mEq/L (ref 96–112)
Creat: 0.99 mg/dL (ref 0.50–1.10)
GLUCOSE: 85 mg/dL (ref 70–99)
Potassium: 3.5 mEq/L (ref 3.5–5.3)
SODIUM: 139 meq/L (ref 135–145)
Total Bilirubin: 0.5 mg/dL (ref 0.2–1.2)
Total Protein: 6.5 g/dL (ref 6.0–8.3)

## 2014-02-05 LAB — LIPID PANEL
Cholesterol: 117 mg/dL (ref 0–200)
HDL: 32 mg/dL — AB (ref >39)
LDL Cholesterol: 51 mg/dL (ref 0–99)
Total CHOL/HDL Ratio: 3.7 Ratio
Triglycerides: 171 mg/dL — ABNORMAL HIGH (ref <150)
VLDL: 34 mg/dL (ref 0–40)

## 2014-02-05 MED ORDER — ALPRAZOLAM 0.25 MG PO TABS: 0.2500 mg | ORAL_TABLET | Freq: Three times a day (TID) | ORAL | Status: DC | PRN

## 2014-02-05 MED ORDER — RAMIPRIL 10 MG PO CAPS: 10.0000 mg | ORAL_CAPSULE | Freq: Every day | ORAL | Status: DC

## 2014-02-05 NOTE — Patient Instructions (Signed)
Melatonin caps at bedtime for sleep 2-5 mg 1/2 hour prior to bed    Preventive Care for Adults A healthy lifestyle and preventive care can promote health and wellness. Preventive health guidelines for women include the following key practices.  A routine yearly physical is a good way to check with your health care provider about your health and preventive screening. It is a chance to share any concerns and updates on your health and to receive a thorough exam.  Visit your dentist for a routine exam and preventive care every 6 months. Brush your teeth twice a day and floss once a day. Good oral hygiene prevents tooth decay and gum disease.  The frequency of eye exams is based on your age, health, family medical history, use of contact lenses, and other factors. Follow your health care provider's recommendations for frequency of eye exams.  Eat a healthy diet. Foods like vegetables, fruits, whole grains, low-fat dairy products, and lean protein foods contain the nutrients you need without too many calories. Decrease your intake of foods high in solid fats, added sugars, and salt. Eat the right amount of calories for you.Get information about a proper diet from your health care provider, if necessary.  Regular physical exercise is one of the most important things you can do for your health. Most adults should get at least 150 minutes of moderate-intensity exercise (any activity that increases your heart rate and causes you to sweat) each week. In addition, most adults need muscle-strengthening exercises on 2 or more days a week.  Maintain a healthy weight. The body mass index (BMI) is a screening tool to identify possible weight problems. It provides an estimate of body fat based on height and weight. Your health care provider can find your BMI and can help you achieve or maintain a healthy weight.For adults 20 years and older:  A BMI below 18.5 is considered underweight.  A BMI of 18.5 to 24.9 is  normal.  A BMI of 25 to 29.9 is considered overweight.  A BMI of 30 and above is considered obese.  Maintain normal blood lipids and cholesterol levels by exercising and minimizing your intake of saturated fat. Eat a balanced diet with plenty of fruit and vegetables. Blood tests for lipids and cholesterol should begin at age 66 and be repeated every 5 years. If your lipid or cholesterol levels are high, you are over 50, or you are at high risk for heart disease, you may need your cholesterol levels checked more frequently.Ongoing high lipid and cholesterol levels should be treated with medicines if diet and exercise are not working.  If you smoke, find out from your health care provider how to quit. If you do not use tobacco, do not start.  Lung cancer screening is recommended for adults aged 71-80 years who are at high risk for developing lung cancer because of a history of smoking. A yearly low-dose CT scan of the lungs is recommended for people who have at least a 30-pack-year history of smoking and are a current smoker or have quit within the past 15 years. A pack year of smoking is smoking an average of 1 pack of cigarettes a day for 1 year (for example: 1 pack a day for 30 years or 2 packs a day for 15 years). Yearly screening should continue until the smoker has stopped smoking for at least 15 years. Yearly screening should be stopped for people who develop a health problem that would prevent them from having  lung cancer treatment.  If you are pregnant, do not drink alcohol. If you are breastfeeding, be very cautious about drinking alcohol. If you are not pregnant and choose to drink alcohol, do not have more than 1 drink per day. One drink is considered to be 12 ounces (355 mL) of beer, 5 ounces (148 mL) of wine, or 1.5 ounces (44 mL) of liquor.  Avoid use of street drugs. Do not share needles with anyone. Ask for help if you need support or instructions about stopping the use of  drugs.  High blood pressure causes heart disease and increases the risk of stroke. Your blood pressure should be checked at least every 1 to 2 years. Ongoing high blood pressure should be treated with medicines if weight loss and exercise do not work.  If you are 74-38 years old, ask your health care provider if you should take aspirin to prevent strokes.  Diabetes screening involves taking a blood sample to check your fasting blood sugar level. This should be done once every 3 years, after age 69, if you are within normal weight and without risk factors for diabetes. Testing should be considered at a younger age or be carried out more frequently if you are overweight and have at least 1 risk factor for diabetes.  Breast cancer screening is essential preventive care for women. You should practice "breast self-awareness." This means understanding the normal appearance and feel of your breasts and may include breast self-examination. Any changes detected, no matter how small, should be reported to a health care provider. Women in their 37s and 30s should have a clinical breast exam (CBE) by a health care provider as part of a regular health exam every 1 to 3 years. After age 23, women should have a CBE every year. Starting at age 56, women should consider having a mammogram (breast X-ray test) every year. Women who have a family history of breast cancer should talk to their health care provider about genetic screening. Women at a high risk of breast cancer should talk to their health care providers about having an MRI and a mammogram every year.  Breast cancer gene (BRCA)-related cancer risk assessment is recommended for women who have family members with BRCA-related cancers. BRCA-related cancers include breast, ovarian, tubal, and peritoneal cancers. Having family members with these cancers may be associated with an increased risk for harmful changes (mutations) in the breast cancer genes BRCA1 and BRCA2.  Results of the assessment will determine the need for genetic counseling and BRCA1 and BRCA2 testing.  Routine pelvic exams to screen for cancer are no longer recommended for nonpregnant women who are considered low risk for cancer of the pelvic organs (ovaries, uterus, and vagina) and who do not have symptoms. Ask your health care provider if a screening pelvic exam is right for you.  If you have had past treatment for cervical cancer or a condition that could lead to cancer, you need Pap tests and screening for cancer for at least 20 years after your treatment. If Pap tests have been discontinued, your risk factors (such as having a new sexual partner) need to be reassessed to determine if screening should be resumed. Some women have medical problems that increase the chance of getting cervical cancer. In these cases, your health care provider may recommend more frequent screening and Pap tests.  The HPV test is an additional test that may be used for cervical cancer screening. The HPV test looks for the virus that can  cause the cell changes on the cervix. The cells collected during the Pap test can be tested for HPV. The HPV test could be used to screen women aged 14 years and older, and should be used in women of any age who have unclear Pap test results. After the age of 24, women should have HPV testing at the same frequency as a Pap test.  Colorectal cancer can be detected and often prevented. Most routine colorectal cancer screening begins at the age of 86 years and continues through age 82 years. However, your health care provider may recommend screening at an earlier age if you have risk factors for colon cancer. On a yearly basis, your health care provider may provide home test kits to check for hidden blood in the stool. Use of a small camera at the end of a tube, to directly examine the colon (sigmoidoscopy or colonoscopy), can detect the earliest forms of colorectal cancer. Talk to your health  care provider about this at age 25, when routine screening begins. Direct exam of the colon should be repeated every 5-10 years through age 37 years, unless early forms of pre-cancerous polyps or small growths are found.  People who are at an increased risk for hepatitis B should be screened for this virus. You are considered at high risk for hepatitis B if:  You were born in a country where hepatitis B occurs often. Talk with your health care provider about which countries are considered high risk.  Your parents were born in a high-risk country and you have not received a shot to protect against hepatitis B (hepatitis B vaccine).  You have HIV or AIDS.  You use needles to inject street drugs.  You live with, or have sex with, someone who has hepatitis B.  You get hemodialysis treatment.  You take certain medicines for conditions like cancer, organ transplantation, and autoimmune conditions.  Hepatitis C blood testing is recommended for all people born from [birth year] through 1965 and any individual with known risks for hepatitis C.  Practice safe sex. Use condoms and avoid high-risk sexual practices to reduce the spread of sexually transmitted infections (STIs). STIs include gonorrhea, chlamydia, syphilis, trichomonas, herpes, HPV, and human immunodeficiency virus (HIV). Herpes, HIV, and HPV are viral illnesses that have no cure. They can result in disability, cancer, and death.  You should be screened for sexually transmitted illnesses (STIs) including gonorrhea and chlamydia if:  You are sexually active and are younger than 24 years.  You are older than 24 years and your health care provider tells you that you are at risk for this type of infection.  Your sexual activity has changed since you were last screened and you are at an increased risk for chlamydia or gonorrhea. Ask your health care provider if you are at risk.  If you are at risk of being infected with HIV, it is recommended  that you take a prescription medicine daily to prevent HIV infection. This is called preexposure prophylaxis (PrEP). You are considered at risk if:  You are a heterosexual woman, are sexually active, and are at increased risk for HIV infection.  You take drugs by injection.  You are sexually active with a partner who has HIV.  Talk with your health care provider about whether you are at high risk of being infected with HIV. If you choose to begin PrEP, you should first be tested for HIV. You should then be tested every 3 months for as long as you  are taking PrEP.  Osteoporosis is a disease in which the bones lose minerals and strength with aging. This can result in serious bone fractures or breaks. The risk of osteoporosis can be identified using a bone density scan. Women ages 49 years and over and women at risk for fractures or osteoporosis should discuss screening with their health care providers. Ask your health care provider whether you should take a calcium supplement or vitamin D to reduce the rate of osteoporosis.  Menopause can be associated with physical symptoms and risks. Hormone replacement therapy is available to decrease symptoms and risks. You should talk to your health care provider about whether hormone replacement therapy is right for you.  Use sunscreen. Apply sunscreen liberally and repeatedly throughout the day. You should seek shade when your shadow is shorter than you. Protect yourself by wearing long sleeves, pants, a wide-brimmed hat, and sunglasses year round, whenever you are outdoors.  Once a month, do a whole body skin exam, using a mirror to look at the skin on your back. Tell your health care provider of new moles, moles that have irregular borders, moles that are larger than a pencil eraser, or moles that have changed in shape or color.  Stay current with required vaccines (immunizations).  Influenza vaccine. All adults should be immunized every year.  Tetanus,  diphtheria, and acellular pertussis (Td, Tdap) vaccine. Pregnant women should receive 1 dose of Tdap vaccine during each pregnancy. The dose should be obtained regardless of the length of time since the last dose. Immunization is preferred during the 27th-36th week of gestation. An adult who has not previously received Tdap or who does not know her vaccine status should receive 1 dose of Tdap. This initial dose should be followed by tetanus and diphtheria toxoids (Td) booster doses every 10 years. Adults with an unknown or incomplete history of completing a 3-dose immunization series with Td-containing vaccines should begin or complete a primary immunization series including a Tdap dose. Adults should receive a Td booster every 10 years.  Varicella vaccine. An adult without evidence of immunity to varicella should receive 2 doses or a second dose if she has previously received 1 dose. Pregnant females who do not have evidence of immunity should receive the first dose after pregnancy. This first dose should be obtained before leaving the health care facility. The second dose should be obtained 4-8 weeks after the first dose.  Human papillomavirus (HPV) vaccine. Females aged 13-26 years who have not received the vaccine previously should obtain the 3-dose series. The vaccine is not recommended for use in pregnant females. However, pregnancy testing is not needed before receiving a dose. If a female is found to be pregnant after receiving a dose, no treatment is needed. In that case, the remaining doses should be delayed until after the pregnancy. Immunization is recommended for any person with an immunocompromised condition through the age of 22 years if she did not get any or all doses earlier. During the 3-dose series, the second dose should be obtained 4-8 weeks after the first dose. The third dose should be obtained 24 weeks after the first dose and 16 weeks after the second dose.  Zoster vaccine. One dose  is recommended for adults aged 74 years or older unless certain conditions are present.  Measles, mumps, and rubella (MMR) vaccine. Adults born before 65 generally are considered immune to measles and mumps. Adults born in [birth year] or later should have 1 or more doses of MMR vaccine  unless there is a contraindication to the vaccine or there is laboratory evidence of immunity to each of the three diseases. A routine second dose of MMR vaccine should be obtained at least 28 days after the first dose for students attending postsecondary schools, health care workers, or international travelers. People who received inactivated measles vaccine or an unknown type of measles vaccine during 1963-1967 should receive 2 doses of MMR vaccine. People who received inactivated mumps vaccine or an unknown type of mumps vaccine before 1979 and are at high risk for mumps infection should consider immunization with 2 doses of MMR vaccine. For females of childbearing age, rubella immunity should be determined. If there is no evidence of immunity, females who are not pregnant should be vaccinated. If there is no evidence of immunity, females who are pregnant should delay immunization until after pregnancy. Unvaccinated health care workers born before [birth year] who lack laboratory evidence of measles, mumps, or rubella immunity or laboratory confirmation of disease should consider measles and mumps immunization with 2 doses of MMR vaccine or rubella immunization with 1 dose of MMR vaccine.  Pneumococcal 13-valent conjugate (PCV13) vaccine. When indicated, a person who is uncertain of her immunization history and has no record of immunization should receive the PCV13 vaccine. An adult aged 58 years or older who has certain medical conditions and has not been previously immunized should receive 1 dose of PCV13 vaccine. This PCV13 should be followed with a dose of pneumococcal polysaccharide (PPSV23) vaccine. The PPSV23 vaccine dose should be  obtained at least 8 weeks after the dose of PCV13 vaccine. An adult aged 65 years or older who has certain medical conditions and previously received 1 or more doses of PPSV23 vaccine should receive 1 dose of PCV13. The PCV13 vaccine dose should be obtained 1 or more years after the last PPSV23 vaccine dose.  Pneumococcal polysaccharide (PPSV23) vaccine. When PCV13 is also indicated, PCV13 should be obtained first. All adults aged 57 years and older should be immunized. An adult younger than age 77 years who has certain medical conditions should be immunized. Any person who resides in a nursing home or long-term care facility should be immunized. An adult smoker should be immunized. People with an immunocompromised condition and certain other conditions should receive both PCV13 and PPSV23 vaccines. People with human immunodeficiency virus (HIV) infection should be immunized as soon as possible after diagnosis. Immunization during chemotherapy or radiation therapy should be avoided. Routine use of PPSV23 vaccine is not recommended for American Indians, [place] Natives, or people younger than 65 years unless there are medical conditions that require PPSV23 vaccine. When indicated, people who have unknown immunization and have no record of immunization should receive PPSV23 vaccine. One-time revaccination 5 years after the first dose of PPSV23 is recommended for people aged 19-64 years who have chronic kidney failure, nephrotic syndrome, asplenia, or immunocompromised conditions. People who received 1-2 doses of PPSV23 before age 40 years should receive another dose of PPSV23 vaccine at age 28 years or later if at least 5 years have passed since the previous dose. Doses of PPSV23 are not needed for people immunized with PPSV23 at or after age 16 years.  Meningococcal vaccine. Adults with asplenia or persistent complement component deficiencies should receive 2 doses of quadrivalent meningococcal conjugate  (MenACWY-D) vaccine. The doses should be obtained at least 2 months apart. Microbiologists working with certain meningococcal bacteria, [place] recruits, people at risk during an outbreak, and people who travel to or live in countries  with a high rate of meningitis should be immunized. A first-year college student up through age 39 years who is living in a residence hall should receive a dose if she did not receive a dose on or after her 16th birthday. Adults who have certain high-risk conditions should receive one or more doses of vaccine.  Hepatitis A vaccine. Adults who wish to be protected from this disease, have certain high-risk conditions, work with hepatitis A-infected animals, work in hepatitis A research labs, or travel to or work in countries with a high rate of hepatitis A should be immunized. Adults who were previously unvaccinated and who anticipate close contact with an international adoptee during the first 60 days after arrival in the [place] from a country with a high rate of hepatitis A should be immunized.  Hepatitis B vaccine. Adults who wish to be protected from this disease, have certain high-risk conditions, may be exposed to blood or other infectious body fluids, are household contacts or sex partners of hepatitis B positive people, are clients or workers in certain care facilities, or travel to or work in countries with a high rate of hepatitis B should be immunized.  Haemophilus influenzae type b (Hib) vaccine. A previously unvaccinated person with asplenia or sickle cell disease or having a scheduled splenectomy should receive 1 dose of Hib vaccine. Regardless of previous immunization, a recipient of a hematopoietic stem cell transplant should receive a 3-dose series 6-12 months after her successful transplant. Hib vaccine is not recommended for adults with HIV infection. Preventive Services / Frequency Ages 70 to 35 years  Blood pressure check.** / Every 1 to 2  years.  Lipid and cholesterol check.** / Every 5 years beginning at age 19.  Clinical breast exam.** / Every 3 years for women in their 40s and 74s.  BRCA-related cancer risk assessment.** / For women who have family members with a BRCA-related cancer (breast, ovarian, tubal, or peritoneal cancers).  Pap test.** / Every 2 years from ages 39 through 62. Every 3 years starting at age 25 through age 55 or 34 with a history of 3 consecutive normal Pap tests.  HPV screening.** / Every 3 years from ages 61 through ages 69 to 32 with a history of 3 consecutive normal Pap tests.  Hepatitis C blood test.** / For any individual with known risks for hepatitis C.  Skin self-exam. / Monthly.  Influenza vaccine. / Every year.  Tetanus, diphtheria, and acellular pertussis (Tdap, Td) vaccine.** / Consult your health care provider. Pregnant women should receive 1 dose of Tdap vaccine during each pregnancy. 1 dose of Td every 10 years.  Varicella vaccine.** / Consult your health care provider. Pregnant females who do not have evidence of immunity should receive the first dose after pregnancy.  HPV vaccine. / 3 doses over 6 months, if 42 and younger. The vaccine is not recommended for use in pregnant females. However, pregnancy testing is not needed before receiving a dose.  Measles, mumps, rubella (MMR) vaccine.** / You need at least 1 dose of MMR if you were born in 1957 or later. You may also need a 2nd dose. For females of childbearing age, rubella immunity should be determined. If there is no evidence of immunity, females who are not pregnant should be vaccinated. If there is no evidence of immunity, females who are pregnant should delay immunization until after pregnancy.  Pneumococcal 13-valent conjugate (PCV13) vaccine.** / Consult your health care provider.  Pneumococcal polysaccharide (PPSV23) vaccine.** / 1  to 2 doses if you smoke cigarettes or if you have certain conditions.  Meningococcal  vaccine.** / 1 dose if you are age 62 to 94 years and a Market researcher living in a residence hall, or have one of several medical conditions, you need to get vaccinated against meningococcal disease. You may also need additional booster doses.  Hepatitis A vaccine.** / Consult your health care provider.  Hepatitis B vaccine.** / Consult your health care provider.  Haemophilus influenzae type b (Hib) vaccine.** / Consult your health care provider. Ages 36 to 83 years  Blood pressure check.** / Every 1 to 2 years.  Lipid and cholesterol check.** / Every 5 years beginning at age 24 years.  Lung cancer screening. / Every year if you are aged 45-80 years and have a 30-pack-year history of smoking and currently smoke or have quit within the past 15 years. Yearly screening is stopped once you have quit smoking for at least 15 years or develop a health problem that would prevent you from having lung cancer treatment.  Clinical breast exam.** / Every year after age 67 years.  BRCA-related cancer risk assessment.** / For women who have family members with a BRCA-related cancer (breast, ovarian, tubal, or peritoneal cancers).  Mammogram.** / Every year beginning at age 5 years and continuing for as long as you are in good health. Consult with your health care provider.  Pap test.** / Every 3 years starting at age 10 years through age 68 or 64 years with a history of 3 consecutive normal Pap tests.  HPV screening.** / Every 3 years from ages 40 years through ages 63 to 54 years with a history of 3 consecutive normal Pap tests.  Fecal occult blood test (FOBT) of stool. / Every year beginning at age 75 years and continuing until age 26 years. You may not need to do this test if you get a colonoscopy every 10 years.  Flexible sigmoidoscopy or colonoscopy.** / Every 5 years for a flexible sigmoidoscopy or every 10 years for a colonoscopy beginning at age 43 years and continuing until age 31  years.  Hepatitis C blood test.** / For all people born from [birth year] through 1965 and any individual with known risks for hepatitis C.  Skin self-exam. / Monthly.  Influenza vaccine. / Every year.  Tetanus, diphtheria, and acellular pertussis (Tdap/Td) vaccine.** / Consult your health care provider. Pregnant women should receive 1 dose of Tdap vaccine during each pregnancy. 1 dose of Td every 10 years.  Varicella vaccine.** / Consult your health care provider. Pregnant females who do not have evidence of immunity should receive the first dose after pregnancy.  Zoster vaccine.** / 1 dose for adults aged 52 years or older.  Measles, mumps, rubella (MMR) vaccine.** / You need at least 1 dose of MMR if you were born in 1957 or later. You may also need a 2nd dose. For females of childbearing age, rubella immunity should be determined. If there is no evidence of immunity, females who are not pregnant should be vaccinated. If there is no evidence of immunity, females who are pregnant should delay immunization until after pregnancy.  Pneumococcal 13-valent conjugate (PCV13) vaccine.** / Consult your health care provider.  Pneumococcal polysaccharide (PPSV23) vaccine.** / 1 to 2 doses if you smoke cigarettes or if you have certain conditions.  Meningococcal vaccine.** / Consult your health care provider.  Hepatitis A vaccine.** / Consult your health care provider.  Hepatitis B vaccine.** / Consult your health  care provider.  Haemophilus influenzae type b (Hib) vaccine.** / Consult your health care provider. Ages 50 years and over  Blood pressure check.** / Every 1 to 2 years.  Lipid and cholesterol check.** / Every 5 years beginning at age 38 years.  Lung cancer screening. / Every year if you are aged 4-80 years and have a 30-pack-year history of smoking and currently smoke or have quit within the past 15 years. Yearly screening is stopped once you have quit smoking for at least 15 years or  develop a health problem that would prevent you from having lung cancer treatment.  Clinical breast exam.** / Every year after age 47 years.  BRCA-related cancer risk assessment.** / For women who have family members with a BRCA-related cancer (breast, ovarian, tubal, or peritoneal cancers).  Mammogram.** / Every year beginning at age 78 years and continuing for as long as you are in good health. Consult with your health care provider.  Pap test.** / Every 3 years starting at age 78 years through age 31 or 87 years with 3 consecutive normal Pap tests. Testing can be stopped between 65 and 70 years with 3 consecutive normal Pap tests and no abnormal Pap or HPV tests in the past 10 years.  HPV screening.** / Every 3 years from ages 65 years through ages 5 or 78 years with a history of 3 consecutive normal Pap tests. Testing can be stopped between 65 and 70 years with 3 consecutive normal Pap tests and no abnormal Pap or HPV tests in the past 10 years.  Fecal occult blood test (FOBT) of stool. / Every year beginning at age 80 years and continuing until age 18 years. You may not need to do this test if you get a colonoscopy every 10 years.  Flexible sigmoidoscopy or colonoscopy.** / Every 5 years for a flexible sigmoidoscopy or every 10 years for a colonoscopy beginning at age 38 years and continuing until age 51 years.  Hepatitis C blood test.** / For all people born from [birth year] through 1963 and any individual with known risks for hepatitis C.  Osteoporosis screening.** / A one-time screening for women ages 57 years and over and women at risk for fractures or osteoporosis.  Skin self-exam. / Monthly.  Influenza vaccine. / Every year.  Tetanus, diphtheria, and acellular pertussis (Tdap/Td) vaccine.** / 1 dose of Td every 10 years.  Varicella vaccine.** / Consult your health care provider.  Zoster vaccine.** / 1 dose for adults aged 65 years or older.  Pneumococcal 13-valent conjugate  (PCV13) vaccine.** / Consult your health care provider.  Pneumococcal polysaccharide (PPSV23) vaccine.** / 1 dose for all adults aged 14 years and older.  Meningococcal vaccine.** / Consult your health care provider.  Hepatitis A vaccine.** / Consult your health care provider.  Hepatitis B vaccine.** / Consult your health care provider.  Haemophilus influenzae type b (Hib) vaccine.** / Consult your health care provider. ** Family history and personal history of risk and conditions may change your health care provider's recommendations. Document Released: 02/13/2001 Document Revised: 05/04/2013 Document Reviewed: 05/15/2010 [facility] Patient Information 2015 [place], [place]. This information is not intended to replace advice given to you by your health care provider. Make sure you discuss any questions you have with your health care provider.

## 2014-02-05 NOTE — Progress Notes (Signed)
[name] [name]  [id number] 04-Apr-1924 02/05/2014      Progress Note-Follow Up  Subjective  Chief Complaint  Chief Complaint  Patient presents with  . Medicare Wellness    HPI  Patient is a 81 y.o. female in today for routine medical care. Patient is in today for anal exam. She is generally doing well but she has had an increase in headaches recently most notably on the right. No fevers or chills. No significant congestion and no other neurologic complaints such as numbness or weakness. No falls. She is doing well at home. She is preparing her own meals and able to perform self-care such as bathing and dressing. She denies any depression or anxiety that is debilitating. Notes her blood pressures at home are ranging from 120s to 140s over 60s to 90. No polyuria or polydipsia. Denies CP/palp/SOB/HA/congestion/fevers/GI or GU c/o. Taking meds as prescribed  Past Medical History  Diagnosis Date  . Paroxysmal a-fib   . Mitral and aortic regurgitation   . Mitral valve prolapse     hx of  . Hypertension   . Hyperlipidemia   . Vitamin D deficiency   . Pneumonia   . Tracheitis   . Gait difficulty   . Anemia   . Melena   . IBS (irritable bowel syndrome)   . Incontinence   . GERD (gastroesophageal reflux disease)   . Osteopenia   . Thyroid disease     hypo  . Personal history of colonic polyps   . Current use of long term anticoagulation   . Cough   . Diarrhea   . Pneumonia     organism unspecified  . Constipation   . UTI (lower urinary tract infection)   . Unspecified adverse effect of other drug, medicinal and biological substance(995.29)   . History of mammogram 2009  . Diverticulosis   . Hyperlipidemia   . Atrial fibrillation   . Arthritis     hips, knees  . Headache(784.0) 06/22/2012  . Renal insufficiency 06/28/2012  . Hyperkalemia 06/28/2012  . Anxiety   . Hypertension     Past Surgical History  Procedure Laterality Date  . Breast biopsy Left   . Cardiac  electrophysiology mapping and ablation    . Partial hysterectomy      ovaries left in place  . Cataract extraction, bilateral    . Esophagoscopy w/ botox injection    . Esophagogastroduodenoscopy N/A 09/03/2012    Procedure: ESOPHAGOGASTRODUODENOSCOPY (EGD);  Surgeon: Inda Castle, MD;  Location: [facility];  Service: Endoscopy;  Laterality: N/A;  . Botox injection N/A 09/03/2012    Procedure: BOTOX INJECTION;  Surgeon: Inda Castle, MD;  Location: [facility];  Service: Endoscopy;  Laterality: N/A;  . I&d extremity Right 11/06/2012    Procedure: IRRIGATION AND DEBRIDEMENT EXTREMITY Right Ring Finger;  Surgeon: Tennis Must, MD;  Location: [facility];  Service: Orthopedics;  Laterality: Right;    Family History  Problem Relation Age of Onset  . Diabetes Mother   . CVA Mother   . COPD Father   . Heart disease Maternal Aunt   . Heart disease Maternal Uncle   . Allergies Daughter   . COPD Daughter   . Stroke Daughter   . COPD Daughter     previous smoker  . Stroke Maternal Grandfather   . Rheum arthritis Father   . Colon cancer Neg Hx   . Colon polyps Neg Hx   . Esophageal cancer Neg Hx   .  Gallbladder disease Neg Hx   . Kidney disease Neg Hx     History   Social History  . Marital Status: Widowed    Spouse Name: N/A    Number of Children: 91  . Years of Education: N/A   Occupational History  . retired    Social History Main Topics  . Smoking status: Never Smoker   . Smokeless tobacco: Never Used  . Alcohol Use: No  . Drug Use: No  . Sexual Activity: No   Other Topics Concern  . Not on file   Social History Narrative    Current Outpatient Prescriptions on File Prior to Visit  Medication Sig Dispense Refill  . ALPRAZolam (XANAX) 0.25 MG tablet TAKE 1 TABLET BY MOUTH 3 TIMES DAILY AS NEEDED FOR ANXIETY 90 tablet 1  . atorvastatin (LIPITOR) 10 MG tablet TAKE 1 TABLET (10 MG TOTAL) BY MOUTH DAILY AT 6 PM. 30 tablet 2  . Cholecalciferol (VITAMIN D) 2000 UNITS CAPS  Take 2,000 Units by mouth every other day.    . fluticasone (FLONASE) 50 MCG/ACT nasal spray PLACE 2 SPRAYS INTO BOTH NOSTRILS DAILY. 16 g 6  . metoprolol tartrate (LOPRESSOR) 25 MG tablet Take 0.5 tablets (12.5 mg total) by mouth 2 (two) times daily. bid 90 tablet 3  . pantoprazole (PROTONIX) 40 MG tablet Take 1 tablet (40 mg total) by mouth daily. 30 tablet 3  . ramipril (ALTACE) 10 MG capsule Take 1 capsule (10 mg total) by mouth daily. 90 capsule 3  . vitamin B-12 (CYANOCOBALAMIN) 1000 MCG tablet Take 1 tablet (1,000 mcg total) by mouth daily.    Marland Kitchen warfarin (COUMADIN) 2.5 MG tablet TAKE AS DIRECTED BY ANTICOAGULATION CLINIC 40 tablet 3   No current facility-administered medications on file prior to visit.    Allergies  Allergen Reactions  . Darifenacin Hydrobromide     REACTION: causes her dizziness  . Sulfonamide Derivatives   . Tramadol Other (See Comments)    Insomnia, anorexia    Review of Systems  Review of Systems  Constitutional: Negative for fever, chills and malaise/fatigue.  HENT: Negative for congestion, hearing loss and nosebleeds.   Eyes: Negative for discharge.  Respiratory: Negative for cough, sputum production, shortness of breath and wheezing.   Cardiovascular: Negative for chest pain, palpitations and leg swelling.  Gastrointestinal: Negative for heartburn, nausea, vomiting, abdominal pain, diarrhea, constipation and blood in stool.  Genitourinary: Negative for dysuria, urgency, frequency and hematuria.  Musculoskeletal: Negative for myalgias, back pain and falls.  Skin: Negative for rash.  Neurological: Negative for dizziness, tremors, sensory change, focal weakness, loss of consciousness, weakness and headaches.  Endo/Heme/Allergies: Negative for polydipsia. Does not bruise/bleed easily.  Psychiatric/Behavioral: Negative for depression and suicidal ideas. The patient is not nervous/anxious and does not have insomnia.     Objective  BP 172/80 mmHg  Pulse  79  Temp(Src) 98 F (36.7 C) (Oral)  Ht 4\' 11"  (1.499 m)  Wt 118 lb (53.524 kg)  BMI 23.82 kg/m2  SpO2 97%  Physical Exam  Physical Exam  Constitutional: She is oriented to person, place, and time and well-developed, well-nourished, and in no distress. No distress.  frail  HENT:  Head: Normocephalic and atraumatic.  Right Ear: External ear normal.  Left Ear: External ear normal.  Nose: Nose normal.  Mouth/Throat: Oropharynx is clear and moist. No oropharyngeal exudate.  Eyes: Conjunctivae are normal. Pupils are equal, round, and reactive to light. Right eye exhibits no discharge. Left eye exhibits no discharge.  No scleral icterus.  Neck: Normal range of motion. Neck supple. No thyromegaly present.  Cardiovascular: Normal rate, regular rhythm and intact distal pulses.   Murmur heard. Pulmonary/Chest: Effort normal and breath sounds normal. No respiratory distress. She has no wheezes. She has no rales.  Abdominal: Soft. Bowel sounds are normal. She exhibits no distension and no mass. There is no tenderness.  Musculoskeletal: Normal range of motion. She exhibits no edema or tenderness.  Lymphadenopathy:    She has no cervical adenopathy.  Neurological: She is alert and oriented to person, place, and time. She has normal reflexes. No cranial nerve deficit. Coordination normal.  Skin: Skin is warm and dry. No rash noted. She is not diaphoretic.  Psychiatric: Mood, memory and affect normal.    Lab Results  Component Value Date   TSH 2.757 05/08/2013   Lab Results  Component Value Date   WBC 10.3 12/03/2013   HGB 12.2 12/03/2013   HCT 36.1 12/03/2013   MCV 91.1 12/03/2013   PLT 173.0 12/03/2013   Lab Results  Component Value Date   CREATININE 1.10 09/18/2013   BUN 16 09/18/2013   NA 143 09/18/2013   K 4.2 09/18/2013   CL 103 09/18/2013   CO2 26 09/18/2013   Lab Results  Component Value Date   ALT 14 06/22/2013   AST 20 06/22/2013   ALKPHOS 23* 06/22/2013   BILITOT  0.2* 06/22/2013   Lab Results  Component Value Date   CHOL 131 05/08/2013   Lab Results  Component Value Date   HDL 36* 05/08/2013   Lab Results  Component Value Date   LDLCALC 65 05/08/2013   Lab Results  Component Value Date   TRIG 151* 05/08/2013   Lab Results  Component Value Date   CHOLHDL 3.6 05/08/2013     Assessment & Plan  Sinus bradycardia RRR today   Essential hypertension Well controlled, no changes to meds. Encouraged heart healthy diet such as the DASH diet and exercise as tolerated.    GERD Avoid offending foods, start probiotics. Do not eat large meals in late evening and consider raising head of bed.    Hypothyroidism On Levothyroxine, continue to monitor   Hyperlipidemia, mixed Tolerating statin, encouraged heart healthy diet, avoid trans fats, minimize simple carbs and saturated fats. Increase exercise as tolerated   UTI (lower urinary tract infection) Urinary frequency and nocturia noted but urine culture negative for infection   Medicare annual wellness visit, subsequent Patient denies any difficulties at home. No trouble with ADLs, depression or falls. No recent changes to vision or hearing. Is UTD with immunizations. Is UTD with screening. Discussed Advanced Directives, patient agrees to bring [place] copies of documents if can. Encouraged heart healthy diet, exercise as tolerated and adequate sleep. Allyson Sabal for Derm Dr [name] for gastroenterology Dr Lynnette Caffey, gynecology Dr [name] cardiology Dr Gwenette Greet pulmonology No further colonoscopy, MGM, paps for screening purposes

## 2014-02-05 NOTE — Progress Notes (Signed)
Pre visit review using our clinic review tool, if applicable. No additional management support is needed unless otherwise documented below in the visit note. 

## 2014-02-06 LAB — URINALYSIS
Bilirubin Urine: NEGATIVE
Glucose, UA: NEGATIVE mg/dL
HGB URINE DIPSTICK: NEGATIVE
Ketones, ur: NEGATIVE mg/dL
Nitrite: NEGATIVE
PROTEIN: NEGATIVE mg/dL
UROBILINOGEN UA: 0.2 mg/dL (ref 0.0–1.0)
pH: 6 (ref 5.0–8.0)

## 2014-02-06 LAB — TSH: TSH: 2.709 u[IU]/mL (ref 0.350–4.500)

## 2014-02-07 LAB — URINE CULTURE: Colony Count: 70000

## 2014-02-12 ENCOUNTER — Ambulatory Visit (INDEPENDENT_AMBULATORY_CARE_PROVIDER_SITE_OTHER): Payer: Medicare Other | Admitting: *Deleted

## 2014-02-12 DIAGNOSIS — Z7901 Long term (current) use of anticoagulants: Secondary | ICD-10-CM

## 2014-02-12 DIAGNOSIS — Z5181 Encounter for therapeutic drug level monitoring: Secondary | ICD-10-CM

## 2014-02-12 DIAGNOSIS — I4891 Unspecified atrial fibrillation: Secondary | ICD-10-CM

## 2014-02-12 LAB — POCT INR: INR: 2

## 2014-02-14 NOTE — Assessment & Plan Note (Signed)
Urinary frequency and nocturia noted but urine culture negative for infection

## 2014-02-14 NOTE — Assessment & Plan Note (Signed)
Avoid offending foods, start probiotics. Do not eat large meals in late evening and consider raising head of bed.  

## 2014-02-14 NOTE — Assessment & Plan Note (Signed)
Patient denies any difficulties at home. No trouble with ADLs, depression or falls. No recent changes to vision or hearing. Is UTD with immunizations. Is UTD with screening. Discussed Advanced Directives, patient agrees to bring [place] copies of documents if can. Encouraged heart healthy diet, exercise as tolerated and adequate sleep. Allyson Sabal for Derm Dr [name] for gastroenterology Dr Lynnette Caffey, gynecology Dr [name] cardiology Dr Gwenette Greet pulmonology No further colonoscopy, MGM, paps for screening purposes

## 2014-02-14 NOTE — Assessment & Plan Note (Signed)
Well controlled, no changes to meds. Encouraged heart healthy diet such as the DASH diet and exercise as tolerated.  °

## 2014-02-14 NOTE — Assessment & Plan Note (Signed)
RRR today 

## 2014-02-14 NOTE — Assessment & Plan Note (Signed)
On Levothyroxine, continue to monitor 

## 2014-02-14 NOTE — Assessment & Plan Note (Signed)
Tolerating statin, encouraged heart healthy diet, avoid trans fats, minimize simple carbs and saturated fats. Increase exercise as tolerated 

## 2014-02-26 ENCOUNTER — Ambulatory Visit (INDEPENDENT_AMBULATORY_CARE_PROVIDER_SITE_OTHER): Payer: Medicare Other | Admitting: Pharmacist

## 2014-02-26 DIAGNOSIS — Z5181 Encounter for therapeutic drug level monitoring: Secondary | ICD-10-CM

## 2014-02-26 DIAGNOSIS — I4891 Unspecified atrial fibrillation: Secondary | ICD-10-CM

## 2014-02-26 DIAGNOSIS — Z7901 Long term (current) use of anticoagulants: Secondary | ICD-10-CM

## 2014-02-26 LAB — POCT INR: INR: 2.9

## 2014-03-02 ENCOUNTER — Other Ambulatory Visit: Payer: Self-pay | Admitting: Family Medicine

## 2014-03-19 ENCOUNTER — Ambulatory Visit (INDEPENDENT_AMBULATORY_CARE_PROVIDER_SITE_OTHER): Payer: Medicare Other

## 2014-03-19 DIAGNOSIS — I4891 Unspecified atrial fibrillation: Secondary | ICD-10-CM

## 2014-03-19 DIAGNOSIS — Z7901 Long term (current) use of anticoagulants: Secondary | ICD-10-CM

## 2014-03-19 DIAGNOSIS — Z5181 Encounter for therapeutic drug level monitoring: Secondary | ICD-10-CM

## 2014-03-19 LAB — POCT INR: INR: 3.7

## 2014-03-28 ENCOUNTER — Other Ambulatory Visit: Payer: Self-pay | Admitting: Internal Medicine

## 2014-03-29 ENCOUNTER — Other Ambulatory Visit: Payer: Self-pay | Admitting: Internal Medicine

## 2014-04-02 ENCOUNTER — Ambulatory Visit (INDEPENDENT_AMBULATORY_CARE_PROVIDER_SITE_OTHER): Payer: Medicare Other

## 2014-04-02 DIAGNOSIS — Z5181 Encounter for therapeutic drug level monitoring: Secondary | ICD-10-CM

## 2014-04-02 DIAGNOSIS — Z7901 Long term (current) use of anticoagulants: Secondary | ICD-10-CM | POA: Diagnosis not present

## 2014-04-02 DIAGNOSIS — I4891 Unspecified atrial fibrillation: Secondary | ICD-10-CM | POA: Diagnosis not present

## 2014-04-02 LAB — POCT INR: INR: 2.4

## 2014-04-06 ENCOUNTER — Other Ambulatory Visit: Payer: Self-pay | Admitting: *Deleted

## 2014-04-06 MED ORDER — METOPROLOL TARTRATE 25 MG PO TABS: 12.5000 mg | ORAL_TABLET | Freq: Two times a day (BID) | ORAL | Status: DC

## 2014-04-06 NOTE — Telephone Encounter (Signed)
Rx sent to the pharmacy by e-script.//AB/CMA 

## 2014-04-16 ENCOUNTER — Telehealth: Payer: Self-pay | Admitting: Family Medicine

## 2014-04-16 NOTE — Telephone Encounter (Signed)
atient Name: [name] [name]  DOB: 10-10-1924    Initial Comment Caller States she is has been spitting up blood for the last 2 days. call could be transerfer over from the answering service, advised the service to let the pt know that a nurse will be calling her back right away.   Nurse Assessment  Nurse: Raphael Gibney, RN, Vanita Ingles Date/Time (Eastern Time): 04/16/2014 11:28:42 AM  Confirm and document reason for call. If symptomatic, describe symptoms. ---Caller states she has coughed up blood 2 mornings in a row. She coughs at night and not during the day. No congestion. No sore throat. No abd pain. She takes Coumadin.  Has the patient traveled out of the country within the last 30 days? ---Not Applicable  Does the patient require triage? ---Yes  Related visit to physician within the last 2 weeks? ---No  Does the PT have any chronic conditions? (i.e. diabetes, asthma, etc.) ---Yes  List chronic conditions. ---acid reflux; heart problems.     Guidelines    Guideline Title Affirmed Question Affirmed Notes  Coughing Up Blood Taking Coumadin (warfarin) or other strong blood thinner, or known bleeding disorder (e.g., thrombocytopenia)    Final Disposition User   See Physician within 24 Hours [place], Therapist, sports, [place]    Comments  No appts available in [employer] or [place]. Pt does not want to go to another clinic and refused appt. Advised she needs to be seen within 24 hrs and she can go to urgent care or ER. states she feels fine. advised call back for difficulty breathing or coughing up more than a tbsp of blood. Verbalized understanding.

## 2014-04-19 ENCOUNTER — Other Ambulatory Visit: Payer: Self-pay | Admitting: Family Medicine

## 2014-04-19 NOTE — Telephone Encounter (Signed)
Called patient at [phone] [facility]) *Preferred* to follow-up and left message asking patient to return call.

## 2014-04-19 NOTE — Telephone Encounter (Signed)
Last refill 02/05/14  #90 with 1 refill Last Office visit 02/05/14 Next Office visit 08/13/14  Advise on Alprazolam 0.25   #90 Refill.

## 2014-04-20 NOTE — Telephone Encounter (Signed)
Faxed hardcopy for Alprazolam to [facility] [place] 

## 2014-04-23 ENCOUNTER — Ambulatory Visit (INDEPENDENT_AMBULATORY_CARE_PROVIDER_SITE_OTHER): Payer: Medicare Other | Admitting: *Deleted

## 2014-04-23 DIAGNOSIS — Z7901 Long term (current) use of anticoagulants: Secondary | ICD-10-CM

## 2014-04-23 DIAGNOSIS — I4891 Unspecified atrial fibrillation: Secondary | ICD-10-CM | POA: Diagnosis not present

## 2014-04-23 DIAGNOSIS — Z5181 Encounter for therapeutic drug level monitoring: Secondary | ICD-10-CM

## 2014-04-23 LAB — POCT INR: INR: 3.1

## 2014-04-29 ENCOUNTER — Ambulatory Visit (HOSPITAL_BASED_OUTPATIENT_CLINIC_OR_DEPARTMENT_OTHER)
Admission: RE | Admit: 2014-04-29 | Discharge: 2014-04-29 | Disposition: A | Discharge status: Home / Self Care | Payer: Medicare Other | Source: Ambulatory Visit | Attending: Medical | Admitting: Medical

## 2014-04-29 ENCOUNTER — Encounter: Payer: Self-pay | Admitting: Medical

## 2014-04-29 ENCOUNTER — Ambulatory Visit (INDEPENDENT_AMBULATORY_CARE_PROVIDER_SITE_OTHER): Payer: Medicare Other | Admitting: Medical

## 2014-04-29 VITALS — BP 150/80 | HR 107 | Temp 98.2°F | Ht 59.0 in | Wt 115.2 lb

## 2014-04-29 DIAGNOSIS — K449 Diaphragmatic hernia without obstruction or gangrene: Secondary | ICD-10-CM | POA: Insufficient documentation

## 2014-04-29 DIAGNOSIS — R0781 Pleurodynia: Secondary | ICD-10-CM

## 2014-04-29 NOTE — Assessment & Plan Note (Signed)
Xray of rt rib and cxr. Tylenol for pain during the day. At night 1/2 tab of norco which she has at home. Make sure it is the 5mg  tab and not the 10 mg.   If pain changes location or new signs or symptoms please notify us.  Follow up in 7-10 days or as needed

## 2014-04-29 NOTE — Progress Notes (Signed)
Subjective:    Patient ID: [name] [name], female    DOB: 1924-06-09, 81 y.o.   MRN: [id number]  HPI  Pt in with some rt side thoracic pain. Pain is faint at rest. But increase with movement. Pt states pain is about 3 wks. Pt states at first thought muscle. But pain getting a little bit worse. At rest no movement 1/10. Level 5-6 with movement. No fall or trauma. No hx of this before.  Pt took tylenol for the pain.   No fever and no chills. Rare cough assoicated with reflux. Not worse recently. No sob and no wheezing.No pain breathing.    Review of Systems  Constitutional: Negative for fever, chills and fatigue.  Cardiovascular: Negative for chest pain and palpitations.  Gastrointestinal: Negative for abdominal pain.  Musculoskeletal:       Rt side thoracic pain over ribs.  Neurological: Negative for dizziness, seizures, syncope, weakness and headaches.  Hematological: Negative for adenopathy. Does not bruise/bleed easily.     Past Medical History  Diagnosis Date  . Paroxysmal a-fib   . Mitral and aortic regurgitation   . Mitral valve prolapse     hx of  . Hypertension   . Hyperlipidemia   . Vitamin D deficiency   . Pneumonia   . Tracheitis   . Gait difficulty   . Anemia   . Melena   . IBS (irritable bowel syndrome)   . Incontinence   . GERD (gastroesophageal reflux disease)   . Osteopenia   . Thyroid disease     hypo  . Personal history of colonic polyps   . Current use of long term anticoagulation   . Cough   . Diarrhea   . Pneumonia     organism unspecified  . Constipation   . UTI (lower urinary tract infection)   . Unspecified adverse effect of other drug, medicinal and biological substance(995.29)   . History of mammogram 2009  . Diverticulosis   . Hyperlipidemia   . Atrial fibrillation   . Arthritis     hips, knees  . Headache(784.0) 06/22/2012  . Renal insufficiency 06/28/2012  . Hyperkalemia 06/28/2012  . Anxiety   . Hypertension   . Medicare  annual wellness visit, subsequent 02/05/2014    History   Social History  . Marital Status: Widowed    Spouse Name: N/A  . Number of Children: 5  . Years of Education: N/A   Occupational History  . retired    Social History Main Topics  . Smoking status: Never Smoker   . Smokeless tobacco: Never Used  . Alcohol Use: No  . Drug Use: No  . Sexual Activity: No     Comment: lives with Daughter, grandson and his family. no dietary restricitons.   Other Topics Concern  . Not on file   Social History Narrative    Past Surgical History  Procedure Laterality Date  . Breast biopsy Left   . Cardiac electrophysiology mapping and ablation    . Partial hysterectomy      ovaries left in place  . Cataract extraction, bilateral    . Esophagoscopy w/ botox injection    . Esophagogastroduodenoscopy N/A 09/03/2012    Procedure: ESOPHAGOGASTRODUODENOSCOPY (EGD);  Surgeon: Inda Castle, MD;  Location: [facility];  Service: Endoscopy;  Laterality: N/A;  . Botox injection N/A 09/03/2012    Procedure: BOTOX INJECTION;  Surgeon: Inda Castle, MD;  Location: [facility];  Service: Endoscopy;  Laterality: N/A;  . I&d  extremity Right 11/06/2012    Procedure: IRRIGATION AND DEBRIDEMENT EXTREMITY Right Ring Finger;  Surgeon: Tennis Must, MD;  Location: [facility];  Service: Orthopedics;  Laterality: Right;    Family History  Problem Relation Age of Onset  . Diabetes Mother   . CVA Mother   . COPD Father   . Rheum arthritis Father   . Heart disease Maternal Aunt   . Heart disease Maternal Uncle   . Allergies Daughter   . COPD Daughter   . Stroke Daughter   . COPD Daughter     previous smoker  . Stroke Maternal Grandfather   . Colon cancer Neg Hx   . Colon polyps Neg Hx   . Esophageal cancer Neg Hx   . Gallbladder disease Neg Hx   . Kidney disease Neg Hx     Allergies  Allergen Reactions  . Darifenacin Hydrobromide     REACTION: causes her dizziness  . Sulfonamide Derivatives   .  Tramadol Other (See Comments)    Insomnia, anorexia    Current Outpatient Prescriptions on File Prior to Visit  Medication Sig Dispense Refill  . ALPRAZolam (XANAX) 0.25 MG tablet TAKE 1 TABLET BY MOUTH 3 TIMES A DAY AS NEEDED FOR ANXIETY 90 tablet 1  . atorvastatin (LIPITOR) 10 MG tablet TAKE 1 TABLET (10 MG TOTAL) BY MOUTH DAILY AT 6 PM. 30 tablet 4  . Cholecalciferol (VITAMIN D) 2000 UNITS CAPS Take 2,000 Units by mouth every other day.    . fluticasone (FLONASE) 50 MCG/ACT nasal spray PLACE 2 SPRAYS INTO BOTH NOSTRILS DAILY. 16 g 6  . metoprolol tartrate (LOPRESSOR) 25 MG tablet Take 0.5 tablets (12.5 mg total) by mouth 2 (two) times daily. bid 90 tablet 1  . pantoprazole (PROTONIX) 40 MG tablet TAKE 1 TABLET (40 MG TOTAL) BY MOUTH DAILY. 30 tablet 8  . ramipril (ALTACE) 10 MG capsule Take 1 capsule (10 mg total) by mouth daily. 90 capsule 3  . vitamin B-12 (CYANOCOBALAMIN) 1000 MCG tablet Take 1 tablet (1,000 mcg total) by mouth daily.    Marland Kitchen warfarin (COUMADIN) 2.5 MG tablet TAKE AS DIRECTED BY ANTICOAGULATION CLINIC 40 tablet 3   No current facility-administered medications on file prior to visit.    BP 150/80 mmHg  Pulse 107  Temp(Src) 98.2 F (36.8 C) (Oral)  Ht 4\' 11"  (1.499 m)  Wt 115 lb 3.2 oz (52.254 kg)  BMI 23.25 kg/m2  SpO2 96%       Objective:   Physical Exam  General- No acute distress. Pleasant patient. Neck- Full range of motion, no jvd Lungs- Clear, even and unlabored. Heart- regular rate and rhythm. Neurologic- CNII- XII grossly intact. Thorax exam- rt posterior rib mild tender to palpation. On review no rash on skin in region of pain. Back- no cva tenderness. No mid thorax pain.      Assessment & Plan:

## 2014-04-29 NOTE — Patient Instructions (Addendum)
Rib pain on right side Xray of rt rib and cxr. Tylenol for pain during the day. At night 1/2 tab of norco which she has at home. Make sure it is the 5mg  tab and not the 10 mg.   If pain changes location or new signs or symptoms please notify us.  Follow up in 7-10 days or as needed

## 2014-04-29 NOTE — Progress Notes (Signed)
Pre visit review using our clinic review tool, if applicable. No additional management support is needed unless otherwise documented below in the visit note. 

## 2014-05-14 ENCOUNTER — Ambulatory Visit (INDEPENDENT_AMBULATORY_CARE_PROVIDER_SITE_OTHER): Payer: Medicare Other | Admitting: *Deleted

## 2014-05-14 DIAGNOSIS — I4891 Unspecified atrial fibrillation: Secondary | ICD-10-CM | POA: Diagnosis not present

## 2014-05-14 DIAGNOSIS — Z5181 Encounter for therapeutic drug level monitoring: Secondary | ICD-10-CM | POA: Diagnosis not present

## 2014-05-14 DIAGNOSIS — Z7901 Long term (current) use of anticoagulants: Secondary | ICD-10-CM | POA: Diagnosis not present

## 2014-05-14 LAB — POCT INR: INR: 2.9

## 2014-06-04 ENCOUNTER — Encounter: Payer: Self-pay | Admitting: Internal Medicine

## 2014-06-04 ENCOUNTER — Ambulatory Visit (INDEPENDENT_AMBULATORY_CARE_PROVIDER_SITE_OTHER): Payer: Medicare Other | Admitting: Internal Medicine

## 2014-06-04 VITALS — BP 118/76 | HR 67 | Temp 97.9°F | Ht 59.0 in | Wt 114.0 lb

## 2014-06-04 DIAGNOSIS — J069 Acute upper respiratory infection, unspecified: Secondary | ICD-10-CM

## 2014-06-04 MED ORDER — CEFUROXIME AXETIL 500 MG PO TABS: 500.0000 mg | ORAL_TABLET | Freq: Two times a day (BID) | ORAL | Status: DC

## 2014-06-04 NOTE — Progress Notes (Signed)
Subjective:    Patient ID: [name] [name], female    DOB: [birth year], 81 y.o.   MRN: [id number]  DOS:  06/04/2014 Type of visit - description : acute Interval history: Symptoms started a few days ago with cough with yellowish sputum, nasal congestion, blowing greenish discharge. She is concerned because often times in the past, URIs turned into pneumonias. She is taking Coricidin HBP with good control of her symptoms   Review of Systems Denies fever chills No sinus pain or congestion No difficulty breathing. No chest congestion or hemoptysis  Past Medical History  Diagnosis Date  . Paroxysmal a-fib   . Mitral and aortic regurgitation   . Mitral valve prolapse     hx of  . Hypertension   . Hyperlipidemia   . Vitamin D deficiency   . Pneumonia   . Tracheitis   . Gait difficulty   . Anemia   . Melena   . IBS (irritable bowel syndrome)   . Incontinence   . GERD (gastroesophageal reflux disease)   . Osteopenia   . Thyroid disease     hypo  . Personal history of colonic polyps   . Current use of long term anticoagulation   . Cough   . Diarrhea   . Pneumonia     organism unspecified  . Constipation   . UTI (lower urinary tract infection)   . Unspecified adverse effect of other drug, medicinal and biological substance(995.29)   . History of mammogram 2009  . Diverticulosis   . Hyperlipidemia   . Atrial fibrillation   . Arthritis     hips, knees  . Headache(784.0) 06/22/2012  . Renal insufficiency 06/28/2012  . Hyperkalemia 06/28/2012  . Anxiety   . Hypertension   . Medicare annual wellness visit, subsequent 02/05/2014    Past Surgical History  Procedure Laterality Date  . Breast biopsy Left   . Cardiac electrophysiology mapping and ablation    . Partial hysterectomy      ovaries left in place  . Cataract extraction, bilateral    . Esophagoscopy w/ botox injection    . Esophagogastroduodenoscopy N/A 09/03/2012    Procedure: ESOPHAGOGASTRODUODENOSCOPY (EGD);   Surgeon: Inda Castle, MD;  Location: [facility];  Service: Endoscopy;  Laterality: N/A;  . Botox injection N/A 09/03/2012    Procedure: BOTOX INJECTION;  Surgeon: Inda Castle, MD;  Location: [facility];  Service: Endoscopy;  Laterality: N/A;  . I&d extremity Right 11/06/2012    Procedure: IRRIGATION AND DEBRIDEMENT EXTREMITY Right Ring Finger;  Surgeon: Tennis Must, MD;  Location: [facility];  Service: Orthopedics;  Laterality: Right;    History   Social History  . Marital Status: Widowed    Spouse Name: N/A  . Number of Children: 5  . Years of Education: N/A   Occupational History  . retired    Social History Main Topics  . Smoking status: Never Smoker   . Smokeless tobacco: Never Used  . Alcohol Use: No  . Drug Use: No  . Sexual Activity: No     Comment: lives with Daughter, grandson and his family. no dietary restricitons.   Other Topics Concern  . Not on file   Social History Narrative        Medication List       This list is accurate as of: 06/04/14 11:59 PM.  Always use your most recent med list.               ALPRAZolam  0.25 MG tablet  Commonly known as:  XANAX  TAKE 1 TABLET BY MOUTH 3 TIMES A DAY AS NEEDED FOR ANXIETY     atorvastatin 10 MG tablet  Commonly known as:  LIPITOR  TAKE 1 TABLET (10 MG TOTAL) BY MOUTH DAILY AT 6 PM.     cefUROXime 500 MG tablet  Commonly known as:  CEFTIN  Take 1 tablet (500 mg total) by mouth 2 (two) times daily with a meal.     fluticasone 50 MCG/ACT nasal spray  Commonly known as:  FLONASE  PLACE 2 SPRAYS INTO BOTH NOSTRILS DAILY.     metoprolol tartrate 25 MG tablet  Commonly known as:  LOPRESSOR  Take 0.5 tablets (12.5 mg total) by mouth 2 (two) times daily. bid     pantoprazole 40 MG tablet  Commonly known as:  PROTONIX  TAKE 1 TABLET (40 MG TOTAL) BY MOUTH DAILY.     ramipril 10 MG capsule  Commonly known as:  ALTACE  Take 1 capsule (10 mg total) by mouth daily.     vitamin B-12 1000 MCG tablet    Commonly known as:  CYANOCOBALAMIN  Take 1 tablet (1,000 mcg total) by mouth daily.     Vitamin D 2000 UNITS Caps  Take 2,000 Units by mouth every other day.     warfarin 2.5 MG tablet  Commonly known as:  COUMADIN  TAKE AS DIRECTED BY ANTICOAGULATION CLINIC           Objective:   Physical Exam BP 118/76 mmHg  Pulse 67  Temp(Src) 97.9 F (36.6 C) (Oral)  Ht 4\' 11"  (1.499 m)  Wt 114 lb (51.71 kg)  BMI 23.01 kg/m2  SpO2 96%  General:   Well developed, well nourished . NAD.  HEENT:  Normocephalic . Face symmetric, atraumatic. TMs normal, throat symmetric, sinuses not TTP Lungs:  CTA B Normal respiratory effort, no intercostal retractions, no accessory muscle use. Heart: Irregular rate and rhythm  No pretibial edema bilaterally  Skin: Not pale. Not jaundice Neurologic:  alert & oriented X3.  Speech normal, gait appropriate for age and unassisted Psych--  Cognition and judgment appear intact.  Cooperative with normal attention span and concentration.  Behavior appropriate. No anxious or depressed appearing.       Assessment & Plan:    URI, Symptoms consistent with URI, continue with Coricidin HBP which is already helping, also use Flonase. If she is not improving in the next few days okay to start a round of abx. See instructions.

## 2014-06-04 NOTE — Progress Notes (Signed)
Pre visit review using our clinic review tool, if applicable. No additional management support is needed unless otherwise documented below in the visit note. 

## 2014-06-04 NOTE — Patient Instructions (Signed)
Rest, fluids , tylenol Continue Coricidin HBP as needed    Flonase : 2 nasal sprays on each side of the nose daily until you feel better   Take the antibiotic as prescribed  (ceftin) only if no better in 2-4 days  Call if not gradually better over the next  10 days Call anytime if the symptoms are severe

## 2014-06-09 ENCOUNTER — Encounter: Payer: Self-pay | Admitting: Family Medicine

## 2014-06-11 ENCOUNTER — Ambulatory Visit (INDEPENDENT_AMBULATORY_CARE_PROVIDER_SITE_OTHER): Payer: Medicare Other | Admitting: *Deleted

## 2014-06-11 DIAGNOSIS — Z7901 Long term (current) use of anticoagulants: Secondary | ICD-10-CM

## 2014-06-11 DIAGNOSIS — Z5181 Encounter for therapeutic drug level monitoring: Secondary | ICD-10-CM

## 2014-06-11 DIAGNOSIS — I4891 Unspecified atrial fibrillation: Secondary | ICD-10-CM | POA: Diagnosis not present

## 2014-06-11 LAB — POCT INR: INR: 3.1

## 2014-06-17 ENCOUNTER — Other Ambulatory Visit: Payer: Self-pay | Admitting: Family Medicine

## 2014-06-17 NOTE — Telephone Encounter (Signed)
Requesting:  ALPRAZOLAM Contract---  SIGNED  02/08/14 UDS---    GIVEN ON 02/08/14---  LOW RISK Last OV---   02/05/14   AND NEXT SCHEDULED OV WITH PCP IS ON 07/30/14 Last Refill---   #90 WITH 1 REFILL ON 04/19/14  Please Advise ON ALPRAZOLAM REFILL

## 2014-06-17 NOTE — Telephone Encounter (Signed)
Faxed hardcopy for Alprazolam to [facility] 

## 2014-06-21 ENCOUNTER — Ambulatory Visit (INDEPENDENT_AMBULATORY_CARE_PROVIDER_SITE_OTHER): Payer: Medicare Other | Admitting: Family

## 2014-06-21 ENCOUNTER — Encounter: Payer: Self-pay | Admitting: Family

## 2014-06-21 VITALS — BP 158/68 | HR 59 | Temp 97.7°F | Resp 16 | Ht 59.0 in | Wt 114.0 lb

## 2014-06-21 DIAGNOSIS — N6452 Nipple discharge: Secondary | ICD-10-CM | POA: Insufficient documentation

## 2014-06-21 NOTE — Progress Notes (Signed)
Subjective:    Patient ID: [name] [name], female    DOB: 05/13/1924, 81 y.o.   MRN: [id number]  HPI  [name] [name] is an 81 yr old female who presents today with chief complaint of discharge from the right breast. Reports bloody discharge which was on her shirt.  No pain.  Occurred again last night.  Last mammogram was 2012.    Review of Systems See HPI  Past Medical History  Diagnosis Date  . Paroxysmal a-fib   . Mitral and aortic regurgitation   . Mitral valve prolapse     hx of  . Hypertension   . Hyperlipidemia   . Vitamin D deficiency   . Pneumonia   . Tracheitis   . Gait difficulty   . Anemia   . Melena   . IBS (irritable bowel syndrome)   . Incontinence   . GERD (gastroesophageal reflux disease)   . Osteopenia   . Thyroid disease     hypo  . Personal history of colonic polyps   . Current use of long term anticoagulation   . Cough   . Diarrhea   . Pneumonia     organism unspecified  . Constipation   . UTI (lower urinary tract infection)   . Unspecified adverse effect of other drug, medicinal and biological substance(995.29)   . History of mammogram 2009  . Diverticulosis   . Hyperlipidemia   . Atrial fibrillation   . Arthritis     hips, knees  . Headache(784.0) 06/22/2012  . Renal insufficiency 06/28/2012  . Hyperkalemia 06/28/2012  . Anxiety   . Hypertension   . Medicare annual wellness visit, subsequent 02/05/2014    History   Social History  . Marital Status: Widowed    Spouse Name: N/A  . Number of Children: 5  . Years of Education: N/A   Occupational History  . retired    Social History Main Topics  . Smoking status: Never Smoker   . Smokeless tobacco: Never Used  . Alcohol Use: No  . Drug Use: No  . Sexual Activity: No     Comment: lives with Daughter, grandson and his family. no dietary restricitons.   Other Topics Concern  . Not on file   Social History Narrative    Past Surgical History  Procedure Laterality Date  . Breast  biopsy Left   . Cardiac electrophysiology mapping and ablation    . Partial hysterectomy      ovaries left in place  . Cataract extraction, bilateral    . Esophagoscopy w/ botox injection    . Esophagogastroduodenoscopy N/A 09/03/2012    Procedure: ESOPHAGOGASTRODUODENOSCOPY (EGD);  Surgeon: Inda Castle, MD;  Location: [facility];  Service: Endoscopy;  Laterality: N/A;  . Botox injection N/A 09/03/2012    Procedure: BOTOX INJECTION;  Surgeon: Inda Castle, MD;  Location: [facility];  Service: Endoscopy;  Laterality: N/A;  . I&d extremity Right 11/06/2012    Procedure: IRRIGATION AND DEBRIDEMENT EXTREMITY Right Ring Finger;  Surgeon: Tennis Must, MD;  Location: [facility];  Service: Orthopedics;  Laterality: Right;    Family History  Problem Relation Age of Onset  . Diabetes Mother   . CVA Mother   . COPD Father   . Rheum arthritis Father   . Heart disease Maternal Aunt   . Heart disease Maternal Uncle   . Allergies Daughter   . COPD Daughter   . Stroke Daughter   . COPD Daughter  previous smoker  . Stroke Maternal Grandfather   . Colon cancer Neg Hx   . Colon polyps Neg Hx   . Esophageal cancer Neg Hx   . Gallbladder disease Neg Hx   . Kidney disease Neg Hx     Allergies  Allergen Reactions  . Darifenacin Hydrobromide     REACTION: causes her dizziness  . Sulfonamide Derivatives   . Tramadol Other (See Comments)    Insomnia, anorexia    Current Outpatient Prescriptions on File Prior to Visit  Medication Sig Dispense Refill  . ALPRAZolam (XANAX) 0.25 MG tablet TAKE 1 TABLET BY MOUTH 3 TIMES A DAY AS NEEDED FOR ANXIETY 90 tablet 1  . atorvastatin (LIPITOR) 10 MG tablet TAKE 1 TABLET (10 MG TOTAL) BY MOUTH DAILY AT 6 PM. 30 tablet 4  . cefUROXime (CEFTIN) 500 MG tablet Take 1 tablet (500 mg total) by mouth 2 (two) times daily with a meal. 14 tablet 0  . Cholecalciferol (VITAMIN D) 2000 UNITS CAPS Take 2,000 Units by mouth every other day.    . fluticasone  (FLONASE) 50 MCG/ACT nasal spray PLACE 2 SPRAYS INTO BOTH NOSTRILS DAILY. 16 g 6  . metoprolol tartrate (LOPRESSOR) 25 MG tablet Take 0.5 tablets (12.5 mg total) by mouth 2 (two) times daily. bid 90 tablet 1  . pantoprazole (PROTONIX) 40 MG tablet TAKE 1 TABLET (40 MG TOTAL) BY MOUTH DAILY. 30 tablet 8  . ramipril (ALTACE) 10 MG capsule Take 1 capsule (10 mg total) by mouth daily. 90 capsule 3  . vitamin B-12 (CYANOCOBALAMIN) 1000 MCG tablet Take 1 tablet (1,000 mcg total) by mouth daily.    Marland Kitchen warfarin (COUMADIN) 2.5 MG tablet TAKE AS DIRECTED BY ANTICOAGULATION CLINIC 40 tablet 3   No current facility-administered medications on file prior to visit.    BP 158/68 mmHg  Pulse 59  Temp(Src) 97.7 F (36.5 C) (Oral)  Resp 16  Ht 4\' 11"  (1.499 m)  Wt 114 lb (51.71 kg)  BMI 23.01 kg/m2  SpO2 97%       Objective:   Physical Exam  Constitutional: She appears well-developed and well-nourished.  Pulmonary/Chest:  Right nipple with a scant amount of dried blood noted at tip of nipple. No retractions, no palpable breast masses noted.    Psychiatric: She has a normal mood and affect. Her behavior is normal. Judgment and thought content normal.          Assessment & Plan:

## 2014-06-21 NOTE — Progress Notes (Signed)
Pre visit review using our clinic review tool, if applicable. No additional management support is needed unless otherwise documented below in the visit note. 

## 2014-06-21 NOTE — Assessment & Plan Note (Signed)
Will check prolactin and diagnostic mammogram.

## 2014-06-21 NOTE — Patient Instructions (Signed)
Please complete lab work prior to leaving. You will be contacted about your mammogram- please let me know if you have not heard back about this referral in 1 week.

## 2014-06-22 ENCOUNTER — Encounter: Payer: Self-pay | Admitting: Family

## 2014-06-22 LAB — PROLACTIN: Prolactin: 7.1 ng/mL

## 2014-06-23 ENCOUNTER — Other Ambulatory Visit: Payer: Self-pay | Admitting: Family

## 2014-06-23 DIAGNOSIS — N6452 Nipple discharge: Secondary | ICD-10-CM

## 2014-06-25 ENCOUNTER — Other Ambulatory Visit: Payer: Self-pay | Admitting: Family

## 2014-06-25 ENCOUNTER — Ambulatory Visit
Admission: RE | Admit: 2014-06-25 | Discharge: 2014-06-25 | Disposition: A | Discharge status: Home / Self Care | Payer: Medicare Other | Source: Ambulatory Visit | Attending: Family | Admitting: Family

## 2014-06-25 DIAGNOSIS — N6452 Nipple discharge: Secondary | ICD-10-CM

## 2014-06-28 ENCOUNTER — Ambulatory Visit (INDEPENDENT_AMBULATORY_CARE_PROVIDER_SITE_OTHER): Payer: Medicare Other | Admitting: Family

## 2014-06-28 ENCOUNTER — Encounter: Payer: Self-pay | Admitting: Family

## 2014-06-28 VITALS — BP 150/84 | HR 72 | Temp 98.2°F | Ht 60.0 in | Wt 115.0 lb

## 2014-06-28 DIAGNOSIS — N6452 Nipple discharge: Secondary | ICD-10-CM | POA: Diagnosis not present

## 2014-06-28 NOTE — Assessment & Plan Note (Signed)
Advised pt to keep her upcoming appointment for right ductogram.

## 2014-06-28 NOTE — Patient Instructions (Signed)
Please keep upcoming appointment at the [facility].

## 2014-06-28 NOTE — Progress Notes (Signed)
Pre visit review using our clinic review tool, if applicable. No additional management support is needed unless otherwise documented below in the visit note. 

## 2014-06-28 NOTE — Progress Notes (Signed)
Subjective:    Patient ID: [name] [name], female    DOB: [birth year], 81 y.o.   MRN: [id number]  HPI  [name] is an 81 yr old female who presents today in follow up. She presented last visit with complaint of bloody discharge from the right nipple.  She underwent mammogram and attempted right ductogram but the ductogram was unsuccessful. She is scheduled for a 2 week follow up ductogram to re-attempt. She reports that she has had no further obvious blood from her right breast.   Review of Systems See HPI  Past Medical History  Diagnosis Date  . Paroxysmal a-fib   . Mitral and aortic regurgitation   . Mitral valve prolapse     hx of  . Hypertension   . Hyperlipidemia   . Vitamin D deficiency   . Pneumonia   . Tracheitis   . Gait difficulty   . Anemia   . Melena   . IBS (irritable bowel syndrome)   . Incontinence   . GERD (gastroesophageal reflux disease)   . Osteopenia   . Thyroid disease     hypo  . Personal history of colonic polyps   . Current use of long term anticoagulation   . Cough   . Diarrhea   . Pneumonia     organism unspecified  . Constipation   . UTI (lower urinary tract infection)   . Unspecified adverse effect of other drug, medicinal and biological substance(995.29)   . History of mammogram 2009  . Diverticulosis   . Hyperlipidemia   . Atrial fibrillation   . Arthritis     hips, knees  . Headache(784.0) 06/22/2012  . Renal insufficiency 06/28/2012  . Hyperkalemia 06/28/2012  . Anxiety   . Hypertension   . Medicare annual wellness visit, subsequent 02/05/2014    History   Social History  . Marital Status: Widowed    Spouse Name: N/A  . Number of Children: 5  . Years of Education: N/A   Occupational History  . retired    Social History Main Topics  . Smoking status: Never Smoker   . Smokeless tobacco: Never Used  . Alcohol Use: No  . Drug Use: No  . Sexual Activity: No     Comment: lives with Daughter, grandson and his family. no  dietary restricitons.   Other Topics Concern  . Not on file   Social History Narrative    Past Surgical History  Procedure Laterality Date  . Breast biopsy Left   . Cardiac electrophysiology mapping and ablation    . Partial hysterectomy      ovaries left in place  . Cataract extraction, bilateral    . Esophagoscopy w/ botox injection    . Esophagogastroduodenoscopy N/A 09/03/2012    Procedure: ESOPHAGOGASTRODUODENOSCOPY (EGD);  Surgeon: Inda Castle, MD;  Location: [facility];  Service: Endoscopy;  Laterality: N/A;  . Botox injection N/A 09/03/2012    Procedure: BOTOX INJECTION;  Surgeon: Inda Castle, MD;  Location: [facility];  Service: Endoscopy;  Laterality: N/A;  . I&d extremity Right 11/06/2012    Procedure: IRRIGATION AND DEBRIDEMENT EXTREMITY Right Ring Finger;  Surgeon: Tennis Must, MD;  Location: [facility];  Service: Orthopedics;  Laterality: Right;    Family History  Problem Relation Age of Onset  . Diabetes Mother   . CVA Mother   . COPD Father   . Rheum arthritis Father   . Heart disease Maternal Aunt   . Heart disease Maternal Uncle   .  Allergies Daughter   . COPD Daughter   . Stroke Daughter   . COPD Daughter     previous smoker  . Stroke Maternal Grandfather   . Colon cancer Neg Hx   . Colon polyps Neg Hx   . Esophageal cancer Neg Hx   . Gallbladder disease Neg Hx   . Kidney disease Neg Hx     Allergies  Allergen Reactions  . Darifenacin Hydrobromide     REACTION: causes her dizziness  . Sulfonamide Derivatives   . Tramadol Other (See Comments)    Insomnia, anorexia    Current Outpatient Prescriptions on File Prior to Visit  Medication Sig Dispense Refill  . ALPRAZolam (XANAX) 0.25 MG tablet TAKE 1 TABLET BY MOUTH 3 TIMES A DAY AS NEEDED FOR ANXIETY 90 tablet 1  . atorvastatin (LIPITOR) 10 MG tablet TAKE 1 TABLET (10 MG TOTAL) BY MOUTH DAILY AT 6 PM. 30 tablet 4  . cefUROXime (CEFTIN) 500 MG tablet Take 1 tablet (500 mg total) by mouth 2  (two) times daily with a meal. 14 tablet 0  . Cholecalciferol (VITAMIN D) 2000 UNITS CAPS Take 2,000 Units by mouth every other day.    . fluticasone (FLONASE) 50 MCG/ACT nasal spray PLACE 2 SPRAYS INTO BOTH NOSTRILS DAILY. 16 g 6  . metoprolol tartrate (LOPRESSOR) 25 MG tablet Take 0.5 tablets (12.5 mg total) by mouth 2 (two) times daily. bid 90 tablet 1  . pantoprazole (PROTONIX) 40 MG tablet TAKE 1 TABLET (40 MG TOTAL) BY MOUTH DAILY. 30 tablet 8  . ramipril (ALTACE) 10 MG capsule Take 1 capsule (10 mg total) by mouth daily. 90 capsule 3  . vitamin B-12 (CYANOCOBALAMIN) 1000 MCG tablet Take 1 tablet (1,000 mcg total) by mouth daily.    Marland Kitchen warfarin (COUMADIN) 2.5 MG tablet TAKE AS DIRECTED BY ANTICOAGULATION CLINIC 40 tablet 3   No current facility-administered medications on file prior to visit.    BP 150/84 mmHg  Pulse 72  Temp(Src) 98.2 F (36.8 C) (Oral)  Ht 5' (1.524 m)  Wt 115 lb (52.164 kg)  BMI 22.46 kg/m2  SpO2 97%       Objective:   Physical Exam  Constitutional: She is oriented to person, place, and time. She appears well-developed and well-nourished.  Neurological: She is alert and oriented to person, place, and time.  Psychiatric: She has a normal mood and affect. Her behavior is normal. Judgment and thought content normal.          Assessment & Plan:

## 2014-07-09 ENCOUNTER — Other Ambulatory Visit: Payer: Self-pay | Admitting: Family

## 2014-07-09 ENCOUNTER — Ambulatory Visit (INDEPENDENT_AMBULATORY_CARE_PROVIDER_SITE_OTHER): Payer: Medicare Other | Admitting: *Deleted

## 2014-07-09 ENCOUNTER — Ambulatory Visit
Admission: RE | Admit: 2014-07-09 | Discharge: 2014-07-09 | Disposition: A | Discharge status: Home / Self Care | Payer: Medicare Other | Source: Ambulatory Visit | Attending: Family | Admitting: Family

## 2014-07-09 DIAGNOSIS — I4891 Unspecified atrial fibrillation: Secondary | ICD-10-CM | POA: Diagnosis not present

## 2014-07-09 DIAGNOSIS — Z7901 Long term (current) use of anticoagulants: Secondary | ICD-10-CM | POA: Diagnosis not present

## 2014-07-09 DIAGNOSIS — Z5181 Encounter for therapeutic drug level monitoring: Secondary | ICD-10-CM | POA: Diagnosis not present

## 2014-07-09 DIAGNOSIS — N6452 Nipple discharge: Secondary | ICD-10-CM

## 2014-07-09 LAB — POCT INR: INR: 2

## 2014-07-09 NOTE — Addendum Note (Signed)
Addended by: Derrel Nip B on: 07/09/2014 11:07 AM   Modules accepted: Level of Service

## 2014-07-29 ENCOUNTER — Encounter: Payer: Self-pay | Admitting: Family Medicine

## 2014-07-29 ENCOUNTER — Ambulatory Visit (HOSPITAL_BASED_OUTPATIENT_CLINIC_OR_DEPARTMENT_OTHER)
Admission: RE | Admit: 2014-07-29 | Discharge: 2014-07-29 | Disposition: A | Discharge status: Home / Self Care | Payer: Medicare Other | Source: Ambulatory Visit | Attending: Medical | Admitting: Medical

## 2014-07-29 ENCOUNTER — Encounter: Payer: Self-pay | Admitting: Medical

## 2014-07-29 ENCOUNTER — Ambulatory Visit (INDEPENDENT_AMBULATORY_CARE_PROVIDER_SITE_OTHER): Payer: Medicare Other | Admitting: Medical

## 2014-07-29 VITALS — BP 163/67 | HR 63 | Temp 97.8°F | Ht 60.0 in | Wt 113.4 lb

## 2014-07-29 DIAGNOSIS — M79674 Pain in right toe(s): Secondary | ICD-10-CM

## 2014-07-29 DIAGNOSIS — L989 Disorder of the skin and subcutaneous tissue, unspecified: Secondary | ICD-10-CM | POA: Diagnosis not present

## 2014-07-29 DIAGNOSIS — S92911A Unspecified fracture of right toe(s), initial encounter for closed fracture: Secondary | ICD-10-CM | POA: Insufficient documentation

## 2014-07-29 DIAGNOSIS — M25571 Pain in right ankle and joints of right foot: Secondary | ICD-10-CM

## 2014-07-29 DIAGNOSIS — X58XXXA Exposure to other specified factors, initial encounter: Secondary | ICD-10-CM | POA: Diagnosis not present

## 2014-07-29 MED ORDER — CEPHALEXIN 500 MG PO CAPS: 500.0000 mg | ORAL_CAPSULE | Freq: Two times a day (BID) | ORAL | Status: DC

## 2014-07-29 NOTE — Progress Notes (Signed)
Pre visit review using our clinic review tool, if applicable. No additional management support is needed unless otherwise documented below in the visit note. 

## 2014-07-29 NOTE — Progress Notes (Signed)
Subjective:    Patient ID: [name] [name], female    DOB: [birth year], 81 y.o.   MRN: [id number]  HPI   Pt has swollen rt great toe. She jammed her toe. This happened 4 weeks ago. Pt has pain on walking. Mild decrease in swelling last week. No fever, no chills or sweats.   Small abrasion at onset of injury at tip of her toe. It was bruised on first day. Gradually bruise has improved.   Review of Systems  Constitutional: Negative for fever, chills and fatigue.  Respiratory: Negative for cough, chest tightness and shortness of breath.   Cardiovascular: Negative for chest pain and palpitations.  Musculoskeletal:       Rt foot and toe pain.  Hematological: Negative for adenopathy. Does not bruise/bleed easily.    Past Medical History  Diagnosis Date  . Paroxysmal a-fib   . Mitral and aortic regurgitation   . Mitral valve prolapse     hx of  . Hypertension   . Hyperlipidemia   . Vitamin D deficiency   . Pneumonia   . Tracheitis   . Gait difficulty   . Anemia   . Melena   . IBS (irritable bowel syndrome)   . Incontinence   . GERD (gastroesophageal reflux disease)   . Osteopenia   . Thyroid disease     hypo  . Personal history of colonic polyps   . Current use of long term anticoagulation   . Cough   . Diarrhea   . Pneumonia     organism unspecified  . Constipation   . UTI (lower urinary tract infection)   . Unspecified adverse effect of other drug, medicinal and biological substance(995.29)   . History of mammogram 2009  . Diverticulosis   . Hyperlipidemia   . Atrial fibrillation   . Arthritis     hips, knees  . Headache(784.0) 06/22/2012  . Renal insufficiency 06/28/2012  . Hyperkalemia 06/28/2012  . Anxiety   . Hypertension   . Medicare annual wellness visit, subsequent 02/05/2014    History   Social History  . Marital Status: Widowed    Spouse Name: N/A  . Number of Children: 5  . Years of Education: N/A   Occupational History  . retired    Social  History Main Topics  . Smoking status: Never Smoker   . Smokeless tobacco: Never Used  . Alcohol Use: No  . Drug Use: No  . Sexual Activity: No     Comment: lives with Daughter, grandson and his family. no dietary restricitons.   Other Topics Concern  . Not on file   Social History Narrative    Past Surgical History  Procedure Laterality Date  . Breast biopsy Left   . Cardiac electrophysiology mapping and ablation    . Partial hysterectomy      ovaries left in place  . Cataract extraction, bilateral    . Esophagoscopy w/ botox injection    . Esophagogastroduodenoscopy N/A 09/03/2012    Procedure: ESOPHAGOGASTRODUODENOSCOPY (EGD);  Surgeon: Inda Castle, MD;  Location: [facility];  Service: Endoscopy;  Laterality: N/A;  . Botox injection N/A 09/03/2012    Procedure: BOTOX INJECTION;  Surgeon: Inda Castle, MD;  Location: [facility];  Service: Endoscopy;  Laterality: N/A;  . I&d extremity Right 11/06/2012    Procedure: IRRIGATION AND DEBRIDEMENT EXTREMITY Right Ring Finger;  Surgeon: Tennis Must, MD;  Location: [facility];  Service: Orthopedics;  Laterality: Right;    Family History  Problem Relation Age of Onset  . Diabetes Mother   . CVA Mother   . COPD Father   . Rheum arthritis Father   . Heart disease Maternal Aunt   . Heart disease Maternal Uncle   . Allergies Daughter   . COPD Daughter   . Stroke Daughter   . COPD Daughter     previous smoker  . Stroke Maternal Grandfather   . Colon cancer Neg Hx   . Colon polyps Neg Hx   . Esophageal cancer Neg Hx   . Gallbladder disease Neg Hx   . Kidney disease Neg Hx     Allergies  Allergen Reactions  . Darifenacin Hydrobromide     REACTION: causes her dizziness  . Sulfonamide Derivatives   . Tramadol Other (See Comments)    Insomnia, anorexia    Current Outpatient Prescriptions on File Prior to Visit  Medication Sig Dispense Refill  . ALPRAZolam (XANAX) 0.25 MG tablet TAKE 1 TABLET BY MOUTH 3 TIMES A DAY AS  NEEDED FOR ANXIETY 90 tablet 1  . atorvastatin (LIPITOR) 10 MG tablet TAKE 1 TABLET (10 MG TOTAL) BY MOUTH DAILY AT 6 PM. 30 tablet 4  . cefUROXime (CEFTIN) 500 MG tablet Take 1 tablet (500 mg total) by mouth 2 (two) times daily with a meal. 14 tablet 0  . Cholecalciferol (VITAMIN D) 2000 UNITS CAPS Take 2,000 Units by mouth every other day.    . metoprolol tartrate (LOPRESSOR) 25 MG tablet Take 0.5 tablets (12.5 mg total) by mouth 2 (two) times daily. bid 90 tablet 1  . pantoprazole (PROTONIX) 40 MG tablet TAKE 1 TABLET (40 MG TOTAL) BY MOUTH DAILY. 30 tablet 8  . ramipril (ALTACE) 10 MG capsule Take 1 capsule (10 mg total) by mouth daily. 90 capsule 3  . vitamin B-12 (CYANOCOBALAMIN) 1000 MCG tablet Take 1 tablet (1,000 mcg total) by mouth daily.    Marland Kitchen warfarin (COUMADIN) 2.5 MG tablet TAKE AS DIRECTED BY ANTICOAGULATION CLINIC 40 tablet 3  . fluticasone (FLONASE) 50 MCG/ACT nasal spray PLACE 2 SPRAYS INTO BOTH NOSTRILS DAILY. 16 g 6   No current facility-administered medications on file prior to visit.    BP 163/67 mmHg  Pulse 63  Temp(Src) 97.8 F (36.6 C) (Oral)  Ht 5' (1.524 m)  Wt 113 lb 6.4 oz (51.438 kg)  BMI 22.15 kg/m2  SpO2 97%       Objective:   Physical Exam  General- No acute distress. Pleasant patient. Lungs- Clear, even and unlabored. Heart- regular rate and rhythm. Neurologic- CNII- XII grossly intact.  Rt foot- mild swollen. But moderate-severe swelling of 2nd toe. Pain on palpation. And pain over 2nd metatarsal. Very aint warmth. No redness or bruise.  Rt elbow- lateral aspect. 2.0 cm seborrheic keratosis type lesion. Mild tender.     Assessment & Plan:  Xray of rt foot today.  Tylenol for pain. Rx for rt foot post op shoe.  If any redness, and warmth over weekend then start cephalexin. But otherwise not to use.  If shows fracture may refer to sport medicine.  Follow up 7 days or as needed

## 2014-07-29 NOTE — Patient Instructions (Addendum)
Xray of rt foot today.  Tylenol for pain. Rx for rt foot post op shoe.  If any redness, and warmth over weekend then start cephalexin. But otherwise not to use.  If shows fracture may refer to sport medicine.  Follow up 7 days or as needed  End noted rt elbow lesion. Just as giving her avs. Will refer to dermatologist.

## 2014-07-30 ENCOUNTER — Other Ambulatory Visit: Payer: Self-pay | Admitting: Surgery

## 2014-07-30 ENCOUNTER — Ambulatory Visit: Payer: Medicare Other | Admitting: Family Medicine

## 2014-08-04 ENCOUNTER — Encounter: Payer: Self-pay | Admitting: Medical

## 2014-08-04 ENCOUNTER — Ambulatory Visit (INDEPENDENT_AMBULATORY_CARE_PROVIDER_SITE_OTHER): Payer: Medicare Other | Admitting: Medical

## 2014-08-04 VITALS — BP 140/60 | HR 80 | Temp 97.7°F | Ht 60.0 in | Wt 116.2 lb

## 2014-08-04 DIAGNOSIS — S92911S Unspecified fracture of right toe(s), sequela: Secondary | ICD-10-CM

## 2014-08-04 NOTE — Patient Instructions (Addendum)
Fx now approaching 5 wks. Doing much better. Continue to wear shoe 1-3 weeks. At least 1 more week but up to 3 weeks if any pain when ambulates without shoe.  Can restart antibiotic since toe is mild red and you noted saw difference immediately with keflex.   Follow up as needed.

## 2014-08-04 NOTE — Progress Notes (Signed)
Pre visit review using our clinic review tool, if applicable. No additional management support is needed unless otherwise documented below in the visit note. 

## 2014-08-04 NOTE — Progress Notes (Signed)
   Subjective:    Patient ID: [name] [name], female    DOB: 05-29-1924, 81 y.o.   MRN: [id number]  HPI  Pt toe stopped hurting since I last saw her. She had tiny fx. base of middle 2nd toe. 5 wks since the injury.   Pt did get post op shoe as I advsised. Pt started keflex since I thought she may have had infection from abrasion at time of accident. She only took 2 days worth and then stopped. Toe still felt better with antibiotic. Then pt stopped. She wants to know if should restart.  Review of Systems  Musculoskeletal:       Toe pain much better. No pain walking. Only faint pain on palpation mid aspect.       Objective:   Physical Exam  General- No acute distress. Pleasant patient. Lungs- Clear, even and unlabored. Heart- regular rate and rhythm. Neurologic- CNII- XII grossly intact.  Rt second toe- no longer tender at the base. Faint pink appearance mid toe and still mild swollen.      Assessment & Plan:  Fx now approaching 5 wks. Doing much better. Continue to wear shoe 1-3 weeks. At least 1 more week but up to 3 weeks if any pain when ambulates without shoe.  Can restart antibiotic since toe is mild red and you noted saw difference immediately with keflex.   Follow up as needed.

## 2014-08-06 ENCOUNTER — Ambulatory Visit (INDEPENDENT_AMBULATORY_CARE_PROVIDER_SITE_OTHER): Payer: Medicare Other | Admitting: *Deleted

## 2014-08-06 DIAGNOSIS — Z5181 Encounter for therapeutic drug level monitoring: Secondary | ICD-10-CM

## 2014-08-06 DIAGNOSIS — Z7901 Long term (current) use of anticoagulants: Secondary | ICD-10-CM | POA: Diagnosis not present

## 2014-08-06 DIAGNOSIS — I4891 Unspecified atrial fibrillation: Secondary | ICD-10-CM

## 2014-08-06 LAB — POCT INR: INR: 2.7

## 2014-08-07 ENCOUNTER — Other Ambulatory Visit: Payer: Self-pay | Admitting: Cardiology

## 2014-08-09 ENCOUNTER — Telehealth: Payer: Self-pay | Admitting: Pharmacist

## 2014-08-09 NOTE — Telephone Encounter (Signed)
Pt is scheduled to have a right breast lumpectomy on 8/17.  She needs clearance to hold Coumadin x 4 days prior to procedure.  Pt has a CHADS score of 2 and no history of CVA.  Per protocol, okay to hold Coumadin.

## 2014-08-10 NOTE — Telephone Encounter (Signed)
Telephoned pt and made her aware to take last dose on 08/13/14 for lumpectomy on 08/18/14 and restart post procedure per surgeons instructions. Pt verbalized instructions.

## 2014-08-13 ENCOUNTER — Ambulatory Visit: Payer: Medicare Other | Admitting: Family Medicine

## 2014-08-16 ENCOUNTER — Encounter (HOSPITAL_BASED_OUTPATIENT_CLINIC_OR_DEPARTMENT_OTHER): Payer: Self-pay | Admitting: *Deleted

## 2014-08-17 ENCOUNTER — Encounter (HOSPITAL_BASED_OUTPATIENT_CLINIC_OR_DEPARTMENT_OTHER): Payer: Self-pay | Admitting: *Deleted

## 2014-08-17 ENCOUNTER — Encounter (HOSPITAL_BASED_OUTPATIENT_CLINIC_OR_DEPARTMENT_OTHER)
Admission: RE | Admit: 2014-08-17 | Discharge: 2014-08-17 | Disposition: A | Discharge status: Home / Self Care | Payer: Medicare Other | Source: Ambulatory Visit | Attending: Surgery | Admitting: Surgery

## 2014-08-17 ENCOUNTER — Other Ambulatory Visit: Payer: Self-pay | Admitting: Family Medicine

## 2014-08-17 ENCOUNTER — Other Ambulatory Visit: Payer: Self-pay

## 2014-08-17 DIAGNOSIS — I10 Essential (primary) hypertension: Secondary | ICD-10-CM | POA: Diagnosis not present

## 2014-08-17 DIAGNOSIS — I4891 Unspecified atrial fibrillation: Secondary | ICD-10-CM | POA: Diagnosis not present

## 2014-08-17 DIAGNOSIS — Z792 Long term (current) use of antibiotics: Secondary | ICD-10-CM | POA: Diagnosis not present

## 2014-08-17 DIAGNOSIS — Z79899 Other long term (current) drug therapy: Secondary | ICD-10-CM | POA: Diagnosis not present

## 2014-08-17 DIAGNOSIS — Z7901 Long term (current) use of anticoagulants: Secondary | ICD-10-CM | POA: Diagnosis not present

## 2014-08-17 DIAGNOSIS — N6452 Nipple discharge: Secondary | ICD-10-CM | POA: Diagnosis present

## 2014-08-17 DIAGNOSIS — D241 Benign neoplasm of right breast: Secondary | ICD-10-CM | POA: Diagnosis not present

## 2014-08-17 DIAGNOSIS — F419 Anxiety disorder, unspecified: Secondary | ICD-10-CM | POA: Diagnosis not present

## 2014-08-17 DIAGNOSIS — K219 Gastro-esophageal reflux disease without esophagitis: Secondary | ICD-10-CM | POA: Diagnosis not present

## 2014-08-17 LAB — BASIC METABOLIC PANEL
ANION GAP: 9 (ref 5–15)
BUN: 14 mg/dL (ref 6–20)
CHLORIDE: 104 mmol/L (ref 101–111)
CO2: 28 mmol/L (ref 22–32)
Calcium: 9.2 mg/dL (ref 8.9–10.3)
Creatinine, Ser: 1.25 mg/dL — ABNORMAL HIGH (ref 0.44–1.00)
GFR calc Af Amer: 43 mL/min — ABNORMAL LOW (ref >60)
GFR calc non Af Amer: 37 mL/min — ABNORMAL LOW (ref >60)
GLUCOSE: 97 mg/dL (ref 65–99)
Potassium: 4.3 mmol/L (ref 3.5–5.1)
Sodium: 141 mmol/L (ref 135–145)

## 2014-08-17 LAB — PROTIME-INR
INR: 1.4 (ref 0.00–1.49)
PROTHROMBIN TIME: 17.3 s — AB (ref 11.6–15.2)

## 2014-08-17 NOTE — Telephone Encounter (Signed)
Faxed hardcopy for [facility] to [facility]

## 2014-08-17 NOTE — Telephone Encounter (Signed)
Requesting:   ALPRAZOLAM Contract   SIGNED ON 02/05/14 UDS    LOW RISK 02/05/14 Last OV   02/05/14 Last Refill    #90 WITH 1 REFILL ON 06/17/14   NEXT SCHEDULED APPOINTMENT WITH PCP IS August 31, 2014  Please Advise

## 2014-08-17 NOTE — H&P (Signed)
[name] [name] 07/30/2014 9:06 AM Location: [facility] Patient #: [id number] DOB: 01-Nov-1924 Widowed / Language: Cleophus [name] / Race: White Female  History of Present Illness (Enrigue Hashimi A. Ninfa Linden MD; 07/30/2014 9:20 AM) Patient words: new-bloody nipple discharge.  The patient is a 81 year old female who presents with a complaint of Breast problems. This is a pleasant female referred by Dr. Penni Homans and Dr. Pamelia Hoit for evaluation of bloody nipple discharge. The patient reports starting to have spontaneous bloody nipple discharge from the right breast approximately 1 month ago. She has no previous history of nipple discharge. She has had a benign mass removed from the left breast in the 1960s. She does frequent self examinations. She is otherwise without complaints. She had a mammogram which was unremarkable. She had an ultrasound demonstrating dilated ducts. A ductogram showed dilation of ducts at the 12 o'clock position of the right breast but no obvious intraductal mass. Excisional biopsy has therefore been recommended. She has no other complaints. She is on Coumadin because of A. fib.   Other Problems Malachi Bonds; 07/30/2014 9:10 AM) Atrial Fibrillation Diverticulosis Gastroesophageal Reflux Disease  Past Surgical History (Chemira Jones; 07/30/2014 9:10 AM) Cataract Surgery Bilateral. Hysterectomy (not due to cancer) - Partial  Diagnostic Studies History Malachi Bonds; 07/30/2014 9:10 AM) Colonoscopy 5-10 years ago Mammogram 1-3 years ago Pap Smear >5 years ago  Allergies Malachi Bonds; 07/30/2014 9:07 AM) Darifenacin Hydrobromide ER *URINARY ANTISPASMODICS* SulfADIAZINE *SULFONAMIDES* TraMADol HCl *ANALGESICS - OPIOID*  Medication History (Chemira Jones; 07/30/2014 9:10 AM) ALPRAZolam (0.25MG  Tablet, Oral) Active. Lipitor (10MG  Tablet, Oral) Active. Ceftin (500MG  Tablet, Oral) Active. Vitamin D (2000UNIT Capsule, Oral) Active. Flonase  (50MCG/ACT Suspension, Nasal) Active. Metoprolol Tartrate (25MG  Tablet, Oral) Active. Pantoprazole Sodium (40MG  Tablet DR, Oral) Active. Ramipril (10MG  Capsule, Oral) Active. Vitamin B-12 (1000MCG Tablet, Oral) Active. Warfarin Sodium (2.5MG  Tablet, Oral) Active.  Social History Malachi Bonds; 07/30/2014 9:10 AM) No alcohol use No drug use Tobacco use Never smoker.  Family History Malachi Bonds; 07/30/2014 9:10 AM) Alcohol Abuse Brother. Arthritis Father. Breast Cancer Daughter. Cerebrovascular Accident Daughter, Mother. Diabetes Mellitus Mother.  Pregnancy / Birth History Malachi Bonds; 07/30/2014 9:10 AM) Age at menarche 61 years. Age of menopause <47 Gravida 5 Maternal age 39-20 Para 5  Review of Systems Malachi Bonds; 07/30/2014 9:10 AM) General Present- Weight Loss. Not Present- Appetite Loss, Chills, Fatigue, Fever, Night Sweats and Weight Gain. Skin Present- Dryness. Not Present- Change in Wart/Mole, Hives, Jaundice, New Lesions, Non-Healing Wounds, Rash and Ulcer. HEENT Present- Wears glasses/contact lenses. Not Present- Earache, Hearing Loss, Hoarseness, Nose Bleed, Oral Ulcers, Ringing in the Ears, Seasonal Allergies, Sinus Pain, Sore Throat, Visual Disturbances and Yellow Eyes. Respiratory Not Present- Bloody sputum, Chronic Cough, Difficulty Breathing, Snoring and Wheezing. Breast Present- Nipple Discharge. Not Present- Breast Mass, Breast Pain and Skin Changes. Cardiovascular Present- Leg Cramps and Palpitations. Not Present- Chest Pain, Difficulty Breathing Lying Down, Rapid Heart Rate, Shortness of Breath and Swelling of Extremities. Gastrointestinal Present- Difficulty Swallowing. Not Present- Abdominal Pain, Bloating, Bloody Stool, Change in Bowel Habits, Chronic diarrhea, Constipation, Excessive gas, Gets full quickly at meals, Hemorrhoids, Indigestion, Nausea, Rectal Pain and Vomiting. Female Genitourinary Present- Frequency. Not Present-  Nocturia, Painful Urination, Pelvic Pain and Urgency. Musculoskeletal Not Present- Back Pain, Joint Pain, Joint Stiffness, Muscle Pain, Muscle Weakness and Swelling of Extremities. Neurological Present- Headaches. Not Present- Decreased Memory, Fainting, Numbness, Seizures, Tingling, Tremor, Trouble walking and Weakness. Psychiatric Present- Anxiety. Not Present- Bipolar, Change in Sleep Pattern, Depression, Fearful and  Frequent crying. Endocrine Present- Hot flashes. Not Present- Cold Intolerance, Excessive Hunger, Hair Changes, Heat Intolerance and New Diabetes. Hematology Present- Easy Bruising. Not Present- Excessive bleeding, Gland problems, HIV and Persistent Infections.   Vitals Malachi Bonds; 07/30/2014 9:06 AM) 07/30/2014 9:06 AM Weight: 113.2 lb Height: 59in Body Surface Area: 1.46 m Body Mass Index: 22.86 kg/m Temp.: 97.82F(Oral)  Pulse: 66 (Regular)  BP: 112/80 (Sitting, Left Arm, Standard)    Physical Exam (Naiya Corral A. Ninfa Linden MD; 07/30/2014 9:20 AM) General Mental Status-Alert. General Appearance-Consistent with stated age. Hydration-Well hydrated. Voice-Normal.  Head and Neck Head-normocephalic, atraumatic with no lesions or palpable masses. Trachea-midline. Thyroid Gland Characteristics - normal size and consistency.  Eye Eyeball - Bilateral-Extraocular movements intact. Sclera/Conjunctiva - Bilateral-No scleral icterus.  Chest and Lung Exam Chest and lung exam reveals -quiet, even and easy respiratory effort with no use of accessory muscles and on auscultation, normal breath sounds, no adventitious sounds and normal vocal resonance. Inspection Chest Wall - Normal. Back - normal.  Breast Breast - Left-Symmetric, Non Tender, No Biopsy scars, no Dimpling, No Inflammation, No Lumpectomy scars, No Mastectomy scars, No Peau d' Orange. Breast - Right-Symmetric, Non Tender, No Biopsy scars, no Dimpling, No Inflammation, No  Lumpectomy scars, No Mastectomy scars, No Peau d' Orange. Breast Lump-No Palpable Breast Mass.  Cardiovascular Cardiovascular examination reveals -normal pedal pulses bilaterally. Auscultation Rhythm - Irregular.  Abdomen Inspection Inspection of the abdomen reveals - No Hernias. Skin - Scar - no surgical scars. Palpation/Percussion Palpation and Percussion of the abdomen reveal - Soft, Non Tender, No Rebound tenderness, No Rigidity (guarding) and No hepatosplenomegaly. Auscultation Auscultation of the abdomen reveals - Bowel sounds normal.  Neurologic Neurologic evaluation reveals -alert and oriented x 3 with no impairment of recent or remote memory. Mental Status-Normal.  Musculoskeletal Normal Exam - Left-Upper Extremity Strength Normal and Lower Extremity Strength Normal. Normal Exam - Right-Upper Extremity Strength Normal and Lower Extremity Strength Normal.  Lymphatic Head & Neck  General Head & Neck Lymphatics: Bilateral - Description - Normal. Axillary  General Axillary Region: Bilateral - Description - Normal. Tenderness - Non Tender. Femoral & Inguinal  Generalized Femoral & Inguinal Lymphatics: Bilateral - Description - Normal. Tenderness - Non Tender.    Assessment & Plan (Chin Wachter A. Ninfa Linden MD; 07/30/2014 9:22 AM) BLOODY DISCHARGE FROM RIGHT NIPPLE (611.79  N64.52) Impression: Given the findings on ductogram, excisional biopsy of the right breast tissue underneath the areola at the 12 o'clock position is recommended to rule out malignancy. I have discussed the reasons for this with the patient in detail. I discussed the risk of surgery which includes but is not limited to bleeding, infection, injury to surrounding structures, need for further surgery if malignancy is present, cardiopulmonary issues, etc. She will need to be off her Coumadin 5 days preoperatively. We will need to get cardiac clearance for this. Easily we did this under an LMA general  anesthetic.

## 2014-08-17 NOTE — Progress Notes (Signed)
Dr Teresa Pelton reviewed ekg.  Spoke with Dr [name] cardiologist for pt.  Same as last/.

## 2014-08-17 NOTE — Progress Notes (Signed)
Dr Ninfa Linden notified of pt results no change in orders

## 2014-08-17 NOTE — Telephone Encounter (Signed)
[facility] 

## 2014-08-18 ENCOUNTER — Ambulatory Visit (HOSPITAL_BASED_OUTPATIENT_CLINIC_OR_DEPARTMENT_OTHER)
Admission: RE | Admit: 2014-08-18 | Discharge: 2014-08-18 | Disposition: A | Discharge status: Home / Self Care | Payer: Medicare Other | Source: Ambulatory Visit | Attending: Surgery | Admitting: Surgery

## 2014-08-18 ENCOUNTER — Encounter (HOSPITAL_BASED_OUTPATIENT_CLINIC_OR_DEPARTMENT_OTHER): Payer: Self-pay | Admitting: *Deleted

## 2014-08-18 ENCOUNTER — Ambulatory Visit (HOSPITAL_BASED_OUTPATIENT_CLINIC_OR_DEPARTMENT_OTHER): Payer: Medicare Other | Admitting: Certified Registered"

## 2014-08-18 ENCOUNTER — Encounter (HOSPITAL_BASED_OUTPATIENT_CLINIC_OR_DEPARTMENT_OTHER)
Admission: RE | Disposition: A | Discharge status: Home / Self Care | Payer: Self-pay | Source: Ambulatory Visit | Attending: Surgery

## 2014-08-18 DIAGNOSIS — D241 Benign neoplasm of right breast: Secondary | ICD-10-CM | POA: Diagnosis not present

## 2014-08-18 DIAGNOSIS — Z79899 Other long term (current) drug therapy: Secondary | ICD-10-CM | POA: Insufficient documentation

## 2014-08-18 DIAGNOSIS — N6452 Nipple discharge: Secondary | ICD-10-CM | POA: Diagnosis not present

## 2014-08-18 DIAGNOSIS — Z792 Long term (current) use of antibiotics: Secondary | ICD-10-CM | POA: Insufficient documentation

## 2014-08-18 DIAGNOSIS — I4891 Unspecified atrial fibrillation: Secondary | ICD-10-CM | POA: Diagnosis not present

## 2014-08-18 DIAGNOSIS — F419 Anxiety disorder, unspecified: Secondary | ICD-10-CM | POA: Insufficient documentation

## 2014-08-18 DIAGNOSIS — Z7901 Long term (current) use of anticoagulants: Secondary | ICD-10-CM | POA: Insufficient documentation

## 2014-08-18 DIAGNOSIS — K219 Gastro-esophageal reflux disease without esophagitis: Secondary | ICD-10-CM | POA: Diagnosis not present

## 2014-08-18 DIAGNOSIS — I10 Essential (primary) hypertension: Secondary | ICD-10-CM | POA: Insufficient documentation

## 2014-08-18 HISTORY — PX: BREAST LUMPECTOMY: SHX2

## 2014-08-18 LAB — POCT HEMOGLOBIN-HEMACUE: Hemoglobin: 11.5 g/dL — ABNORMAL LOW (ref 12.0–15.0)

## 2014-08-18 SURGERY — BREAST LUMPECTOMY
Anesthesia: General | Site: Breast | Laterality: Right

## 2014-08-18 MED ORDER — FENTANYL CITRATE (PF) 100 MCG/2ML IJ SOLN
25.0000 ug | INTRAMUSCULAR | Status: DC | PRN
Start: 2014-08-18 — End: 2014-08-18

## 2014-08-18 MED ORDER — MIDAZOLAM HCL 2 MG/2ML IJ SOLN: 1.0000 mg | INTRAMUSCULAR | Status: DC | PRN

## 2014-08-18 MED ORDER — SODIUM CHLORIDE 0.9 % IJ SOLN: 3.0000 mL | INTRAMUSCULAR | Status: DC | PRN

## 2014-08-18 MED ORDER — LACTATED RINGERS IV SOLN: INTRAVENOUS | Status: DC

## 2014-08-18 MED ORDER — LIDOCAINE HCL (PF) 1 % IJ SOLN
INTRAMUSCULAR | Status: AC
  Filled 2014-08-18: qty 30

## 2014-08-18 MED ORDER — FENTANYL CITRATE (PF) 100 MCG/2ML IJ SOLN
50.0000 ug | INTRAMUSCULAR | Status: DC | PRN
  Administered 2014-08-18: 50 ug via INTRAVENOUS

## 2014-08-18 MED ORDER — FENTANYL CITRATE (PF) 100 MCG/2ML IJ SOLN
INTRAMUSCULAR | Status: AC
  Filled 2014-08-18: qty 4

## 2014-08-18 MED ORDER — GLYCOPYRROLATE 0.2 MG/ML IJ SOLN: 0.2000 mg | Freq: Once | INTRAMUSCULAR | Status: DC | PRN

## 2014-08-18 MED ORDER — LIDOCAINE HCL (PF) 1 % IJ SOLN
INTRAMUSCULAR | Status: DC | PRN
  Administered 2014-08-18 (×2): 10 mL

## 2014-08-18 MED ORDER — ACETAMINOPHEN 650 MG RE SUPP: 650.0000 mg | RECTAL | Status: DC | PRN

## 2014-08-18 MED ORDER — ONDANSETRON HCL 4 MG/2ML IJ SOLN
INTRAMUSCULAR | Status: DC | PRN
  Administered 2014-08-18: 4 mg via INTRAVENOUS

## 2014-08-18 MED ORDER — HYDROCODONE-ACETAMINOPHEN 5-325 MG PO TABS: 1.0000 | ORAL_TABLET | Freq: Four times a day (QID) | ORAL | Status: DC | PRN

## 2014-08-18 MED ORDER — CEFAZOLIN SODIUM-DEXTROSE 2-3 GM-% IV SOLR
INTRAVENOUS | Status: AC
  Filled 2014-08-18: qty 50

## 2014-08-18 MED ORDER — ACETAMINOPHEN 325 MG PO TABS: 650.0000 mg | ORAL_TABLET | ORAL | Status: DC | PRN

## 2014-08-18 MED ORDER — LIDOCAINE HCL (CARDIAC) 20 MG/ML IV SOLN
INTRAVENOUS | Status: DC | PRN
  Administered 2014-08-18: 60 mg via INTRAVENOUS

## 2014-08-18 MED ORDER — PROPOFOL INFUSION 10 MG/ML OPTIME
INTRAVENOUS | Status: DC | PRN
  Administered 2014-08-18: 75 ug/kg/min via INTRAVENOUS

## 2014-08-18 MED ORDER — CEFAZOLIN SODIUM-DEXTROSE 2-3 GM-% IV SOLR
2.0000 g | INTRAVENOUS | Status: AC
  Administered 2014-08-18: 2 g via INTRAVENOUS

## 2014-08-18 MED ORDER — FENTANYL CITRATE (PF) 100 MCG/2ML IJ SOLN: 25.0000 ug | INTRAMUSCULAR | Status: DC | PRN

## 2014-08-18 MED ORDER — SODIUM CHLORIDE 0.9 % IJ SOLN: 3.0000 mL | Freq: Two times a day (BID) | INTRAMUSCULAR | Status: DC

## 2014-08-18 MED ORDER — SODIUM CHLORIDE 0.9 % IV SOLN: 250.0000 mL | INTRAVENOUS | Status: DC | PRN

## 2014-08-18 MED ORDER — OXYCODONE HCL 5 MG PO TABS: 5.0000 mg | ORAL_TABLET | ORAL | Status: DC | PRN

## 2014-08-18 MED ORDER — SCOPOLAMINE 1 MG/3DAYS TD PT72: 1.0000 | MEDICATED_PATCH | Freq: Once | TRANSDERMAL | Status: DC | PRN

## 2014-08-18 MED ORDER — LACTATED RINGERS IV SOLN
INTRAVENOUS | Status: DC
  Administered 2014-08-18: 11:00:00 via INTRAVENOUS

## 2014-08-18 SURGICAL SUPPLY — 46 items
BLADE HEX COATED 2.75 (ELECTRODE) ×3 IMPLANT
BLADE SURG 15 STRL LF DISP TIS (BLADE) ×1 IMPLANT
BLADE SURG 15 STRL SS (BLADE) ×3
CANISTER SUCT 1200ML W/VALVE (MISCELLANEOUS) IMPLANT
CHLORAPREP W/TINT 26ML (MISCELLANEOUS) ×3 IMPLANT
CLIP TI WIDE RED SMALL 6 (CLIP) IMPLANT
CLOSURE WOUND 1/2 X4 (GAUZE/BANDAGES/DRESSINGS)
COVER BACK TABLE 60X90IN (DRAPES) ×3 IMPLANT
COVER MAYO STAND STRL (DRAPES) ×3 IMPLANT
DECANTER SPIKE VIAL GLASS SM (MISCELLANEOUS) IMPLANT
DEVICE DUBIN W/COMP PLATE 8390 (MISCELLANEOUS) IMPLANT
DRAPE LAPAROTOMY 100X72 PEDS (DRAPES) ×3 IMPLANT
DRAPE UTILITY XL STRL (DRAPES) ×3 IMPLANT
DRSG TEGADERM 4X4.75 (GAUZE/BANDAGES/DRESSINGS) ×1 IMPLANT
ELECT REM PT RETURN 9FT ADLT (ELECTROSURGICAL) ×3
ELECTRODE REM PT RTRN 9FT ADLT (ELECTROSURGICAL) ×1 IMPLANT
GLOVE BIO SURGEON STRL SZ 6.5 (GLOVE) ×2 IMPLANT
GLOVE BIO SURGEONS STRL SZ 6.5 (GLOVE) ×2
GLOVE BIOGEL PI IND STRL 7.0 (GLOVE) IMPLANT
GLOVE BIOGEL PI INDICATOR 7.0 (GLOVE) ×4
GLOVE SURG SIGNA 7.5 PF LTX (GLOVE) ×3 IMPLANT
GOWN STRL REUS W/ TWL LRG LVL3 (GOWN DISPOSABLE) ×1 IMPLANT
GOWN STRL REUS W/ TWL XL LVL3 (GOWN DISPOSABLE) ×1 IMPLANT
GOWN STRL REUS W/TWL LRG LVL3 (GOWN DISPOSABLE) ×6
GOWN STRL REUS W/TWL XL LVL3 (GOWN DISPOSABLE) ×6
KIT MARKER MARGIN INK (KITS) ×3 IMPLANT
LIQUID BAND (GAUZE/BANDAGES/DRESSINGS) ×3 IMPLANT
NDL HYPO 25X1 1.5 SAFETY (NEEDLE) ×1 IMPLANT
NEEDLE HYPO 25X1 1.5 SAFETY (NEEDLE) ×3 IMPLANT
NS IRRIG 1000ML POUR BTL (IV SOLUTION) ×1 IMPLANT
PACK BASIN DAY SURGERY FS (CUSTOM PROCEDURE TRAY) ×3 IMPLANT
PENCIL BUTTON HOLSTER BLD 10FT (ELECTRODE) ×3 IMPLANT
SLEEVE SCD COMPRESS KNEE MED (MISCELLANEOUS) ×2 IMPLANT
SPONGE GAUZE 4X4 12PLY STER LF (GAUZE/BANDAGES/DRESSINGS) ×1 IMPLANT
SPONGE LAP 4X18 X RAY DECT (DISPOSABLE) ×3 IMPLANT
STRIP CLOSURE SKIN 1/2X4 (GAUZE/BANDAGES/DRESSINGS) ×1 IMPLANT
SUT MNCRL AB 4-0 PS2 18 (SUTURE) ×3 IMPLANT
SUT SILK 2 0 SH (SUTURE) ×3 IMPLANT
SUT VIC AB 3-0 SH 27 (SUTURE) ×3
SUT VIC AB 3-0 SH 27X BRD (SUTURE) ×1 IMPLANT
SYR CONTROL 10ML LL (SYRINGE) ×3 IMPLANT
TOWEL OR 17X24 6PK STRL BLUE (TOWEL DISPOSABLE) ×3 IMPLANT
TOWEL OR NON WOVEN STRL DISP B (DISPOSABLE) ×3 IMPLANT
TUBE CONNECTING 20'X1/4 (TUBING)
TUBE CONNECTING 20X1/4 (TUBING) IMPLANT
YANKAUER SUCT BULB TIP NO VENT (SUCTIONS) IMPLANT

## 2014-08-18 NOTE — Anesthesia Procedure Notes (Signed)
Procedure Name: MAC Date/Time: 08/18/2014 11:45 AM Performed by: Zailee Vallely D Pre-anesthesia Checklist: Patient identified, Emergency Drugs available, Suction available, Patient being monitored and Timeout performed Patient Re-evaluated:Patient Re-evaluated prior to inductionOxygen Delivery Method: Simple face mask

## 2014-08-18 NOTE — Anesthesia Postprocedure Evaluation (Signed)
  Anesthesia Post-op Note  Patient: [name] [name]  Procedure(s) Performed: Procedure(s): RIGHT BREAST LUMPECTOMY (Right)  Patient Location: PACU  Anesthesia Type:General  Level of Consciousness: awake and alert   Airway and Oxygen Therapy: Patient Spontanous Breathing  Post-op Pain: Controlled  Post-op Assessment: Post-op Vital signs reviewed, Patient's Cardiovascular Status Stable and Respiratory Function Stable  Post-op Vital Signs: Reviewed  Filed Vitals:   08/18/14 1256  BP:   Pulse: 60  Temp:   Resp: 16    Complications: No apparent anesthesia complications

## 2014-08-18 NOTE — Interval H&P Note (Signed)
History and Physical Interval Note: no change in H and P  08/18/2014 11:20 AM  [name] [name]  has presented today for surgery, with the diagnosis of Right Breast Nipple Discharge  The various methods of treatment have been discussed with the patient and family. After consideration of risks, benefits and other options for treatment, the patient has consented to  Procedure(s): RIGHT BREAST LUMPECTOMY (Right) as a surgical intervention .  The patient's history has been reviewed, patient examined, no change in status, stable for surgery.  I have reviewed the patient's chart and labs.  Questions were answered to the patient's satisfaction.     Desyre Calma A

## 2014-08-18 NOTE — Op Note (Signed)
RIGHT BREAST LUMPECTOMY  Procedure Note  [name] [name] 08/18/2014   Pre-op Diagnosis: Right Breast Nipple Discharge     Post-op Diagnosis: same  Procedure(s): RIGHT BREAST LUMPECTOMY  Surgeon(s): Coralie Keens, MD  Anesthesia: General  Staff:  Circulator: Maurene Capes, RN Relief Circulator: Glenna Fellows, RN Relief Scrub: Glenna Fellows, RN Scrub Person: Mickie Bail, CST  Estimated Blood Loss: Minimal               Specimens: sent to path          [facility] A   Date: 08/18/2014  Time: 12:13 PM

## 2014-08-18 NOTE — Anesthesia Preprocedure Evaluation (Signed)
Anesthesia Evaluation   Patient awake    Reviewed: Allergy & Precautions, NPO status , Patient's Chart, lab work & pertinent test results  Airway Mallampati: I       Dental  (+) Edentulous Upper, Edentulous Lower   Pulmonary  breath sounds clear to auscultation        Cardiovascular hypertension, Pt. on medications + dysrhythmias Atrial Fibrillation Rhythm:Irregular     Neuro/Psych Anxiety    GI/Hepatic GERD-  Medicated and Poorly Controlled,  Endo/Other  Hypothyroidism   Renal/GU CRFRenal disease  negative genitourinary   Musculoskeletal   Abdominal   Peds negative pediatric ROS (+)  Hematology   Anesthesia Other Findings   Reproductive/Obstetrics                             Anesthesia Physical Anesthesia Plan  ASA: III  Anesthesia Plan: General   Post-op Pain Management:    Induction: Intravenous  Airway Management Planned: Oral ETT  Additional Equipment:   Intra-op Plan:   Post-operative Plan: Extubation in OR  Informed Consent: I have reviewed the patients History and Physical, chart, labs and discussed the procedure including the risks, benefits and alternatives for the proposed anesthesia with the patient or authorized representative who has indicated his/her understanding and acceptance.   Dental advisory given  Plan Discussed with: CRNA  Anesthesia Plan Comments:         Anesthesia Quick Evaluation

## 2014-08-18 NOTE — Discharge Instructions (Signed)
[facility] °Office Phone Number [phone] ° °BREAST BIOPSY/ PARTIAL MASTECTOMY: POST OP INSTRUCTIONS ° °Always review your discharge instruction sheet given to you by the facility where your surgery was performed. ° °IF YOU HAVE DISABILITY OR FAMILY LEAVE FORMS, YOU MUST BRING THEM TO THE OFFICE FOR PROCESSING.  DO NOT GIVE THEM TO YOUR DOCTOR. ° °1. A prescription for pain medication may be given to you upon discharge.  Take your pain medication as prescribed, if needed.  If narcotic pain medicine is not needed, then you may take acetaminophen (Tylenol) or ibuprofen (Advil) as needed. °2. Take your usually prescribed medications unless otherwise directed °3. If you need a refill on your pain medication, please contact your pharmacy.  They will contact our office to request authorization.  Prescriptions will not be filled after 5pm or on week-ends. °4. You should eat very light the first 24 hours after surgery, such as soup, crackers, pudding, etc.  Resume your normal diet the day after surgery. °5. Most patients will experience some swelling and bruising in the breast.  Ice packs and a good support bra will help.  Swelling and bruising can take several days to resolve.  °6. It is common to experience some constipation if taking pain medication after surgery.  Increasing fluid intake and taking a stool softener will usually help or prevent this problem from occurring.  A mild laxative (Milk of Magnesia or Miralax) should be taken according to package directions if there are no bowel movements after 48 hours. °7. Unless discharge instructions indicate otherwise, you may remove your bandages 24-48 hours after surgery, and you may shower at that time.  You may have steri-strips (small skin tapes) in place directly over the incision.  These strips should be left on the skin for 7-10 days.  If your surgeon used skin glue on the incision, you may shower in 24 hours.  The glue will flake off over the  next 2-3 weeks.  Any sutures or staples will be removed at the office during your follow-up visit. °8. ACTIVITIES:  You may resume regular daily activities (gradually increasing) beginning the next day.  Wearing a good support bra or sports bra minimizes pain and swelling.  You may have sexual intercourse when it is comfortable. °a. You may drive when you no longer are taking prescription pain medication, you can comfortably wear a seatbelt, and you can safely maneuver your car and apply brakes. °b. RETURN TO WORK:  ______________________________________________________________________________________ °9. You should see your doctor in the office for a follow-up appointment approximately two weeks after your surgery.  Your doctor’s nurse will typically make your follow-up appointment when she calls you with your pathology report.  Expect your pathology report 2-3 business days after your surgery.  You may call to check if you do not hear from us after three days. °10. OTHER INSTRUCTIONS: _______________________________________________________________________________________________ _____________________________________________________________________________________________________________________________________ °_____________________________________________________________________________________________________________________________________ °_____________________________________________________________________________________________________________________________________ ° °WHEN TO CALL YOUR DOCTOR: °1. Fever over 101.0 °2. Nausea and/or vomiting. °3. Extreme swelling or bruising. °4. Continued bleeding from incision. °5. Increased pain, redness, or drainage from the incision. ° °The clinic staff is available to answer your questions during regular business hours.  Please don’t hesitate to call and ask to speak to one of the nurses for clinical concerns.  If you have a medical emergency, go to the nearest  emergency room or call 911.  A surgeon from [facility] is always on call at the hospital. ° °For further questions, please visit [email or web address]  ° ° ° °  Post Anesthesia Home Care Instructions ° °Activity: °Get plenty of rest for the remainder of the day. A responsible adult should stay with you for 24 hours following the procedure.  °For the next 24 hours, DO NOT: °-Drive a car °-Operate machinery °-Drink alcoholic beverages °-Take any medication unless instructed by your physician °-Make any legal decisions or sign important papers. ° °Meals: °Start with liquid foods such as gelatin or soup. Progress to regular foods as tolerated. Avoid greasy, spicy, heavy foods. If nausea and/or vomiting occur, drink only clear liquids until the nausea and/or vomiting subsides. Call your physician if vomiting continues. ° °Special Instructions/Symptoms: °Your throat may feel dry or sore from the anesthesia or the breathing tube placed in your throat during surgery. If this causes discomfort, gargle with warm salt water. The discomfort should disappear within 24 hours. ° °If you had a scopolamine patch placed behind your ear for the management of post- operative nausea and/or vomiting: ° °1. The medication in the patch is effective for 72 hours, after which it should be removed.  Wrap patch in a tissue and discard in the trash. Wash hands thoroughly with soap and water. °2. You may remove the patch earlier than 72 hours if you experience unpleasant side effects which may include dry mouth, dizziness or visual disturbances. °3. Avoid touching the patch. Wash your hands with soap and water after contact with the patch. °  ° °

## 2014-08-18 NOTE — Transfer of Care (Signed)
Immediate Anesthesia Transfer of Care Note  Patient: [name] [name]  Procedure(s) Performed: Procedure(s): RIGHT BREAST LUMPECTOMY (Right)  Patient Location: PACU  Anesthesia Type:MAC  Level of Consciousness: awake, alert , oriented and patient cooperative  Airway & Oxygen Therapy: Patient Spontanous Breathing and Patient connected to face mask oxygen  Post-op Assessment: Report given to RN and Post -op Vital signs reviewed and stable  Post vital signs: Reviewed and stable  Last Vitals:  Filed Vitals:   08/18/14 1030  BP: 154/60  Pulse: 67  Temp: 36.6 C  Resp: 20    Complications: No apparent anesthesia complications

## 2014-08-19 ENCOUNTER — Encounter (HOSPITAL_BASED_OUTPATIENT_CLINIC_OR_DEPARTMENT_OTHER): Payer: Self-pay | Admitting: Surgery

## 2014-08-19 NOTE — Op Note (Signed)
NAMEJEANITA, CARNEIRO                ACCOUNT NO.:  [id number]  MEDICAL RECORD NO.:  [id number]  LOCATION:                               FACILITY:  [facility]  PHYSICIAN:  Coralie Keens, M.D. DATE OF BIRTH:  January 25, 1924  DATE OF PROCEDURE:  08/18/2014 DATE OF DISCHARGE:  08/18/2014                              OPERATIVE REPORT   PREOPERATIVE DIAGNOSIS:  Right breast bloody nipple discharge.  POSTOPERATIVE DIAGNOSIS:  Right breast bloody nipple discharge.  PROCEDURE:  Right breast lumpectomy.  SURGEON:  Coralie Keens, MD  ANESTHESIA:  1% lidocaine with monitored anesthesia care.  ESTIMATED BLOOD LOSS:  Minimal.  INDICATIONS:  This is an 81 year old female who presented with bloody nipple discharge.  She has been on Coumadin.  She had an ultrasound and mammogram performed.  There were no abnormalities seen on mammogram. The ultrasound did show dilated ducts.  No papilloma was identified.  At this point, decision was made to proceed with an excisional biopsy via a right breast lumpectomy to rule out malignancy.  PROCEDURE IN DETAIL:  The patient was brought to operating room, identified as Boyce Medici.  She was placed supine on the operating room table and anesthesia was induced.  Her right breast was then prepped and draped in usual sterile fashion.  I anesthetized the skin at the 12 o'clock position of the areola with lidocaine and then made a circumareolar incision with a scalpel.  I took this down to the breast tissue at the 12 o'clock position where the dilated ducts were located with the cautery.  I then performed a lumpectomy going slightly underneath the upper portion of the areola and down to the breast tissue.  Several large dilated ducts were identified with small amount of what appeared to be blood.  I performed a lumpectomy removing all this tissue.  Once the lumpectomy was performed, I marked all margins of the specimen with marker pen and sent to Pathology for  evaluation. Hemostasis was then achieved with the cautery.  I anesthetized the wound further with lidocaine.  I then closed subcutaneous tissue with interrupted 3-0 Vicryl sutures and closed the skin with a running 4-0 Monocryl.  Skin glue was then applied.  The patient tolerated the procedure well.  All the counts were correct at the end of procedure. The patient was then taken in a stable condition from the operating room to the recovery room.    Coralie Keens, M.D.    DB/MEDQ  D:  08/18/2014  T:  08/19/2014  Job:  [id number]

## 2014-08-31 ENCOUNTER — Ambulatory Visit (INDEPENDENT_AMBULATORY_CARE_PROVIDER_SITE_OTHER): Payer: Medicare Other | Admitting: Family Medicine

## 2014-08-31 ENCOUNTER — Encounter: Payer: Self-pay | Admitting: Family Medicine

## 2014-08-31 VITALS — BP 130/80 | HR 72 | Temp 97.9°F | Ht 59.0 in | Wt 114.0 lb

## 2014-08-31 DIAGNOSIS — Z23 Encounter for immunization: Secondary | ICD-10-CM | POA: Diagnosis not present

## 2014-08-31 DIAGNOSIS — E782 Mixed hyperlipidemia: Secondary | ICD-10-CM | POA: Diagnosis not present

## 2014-08-31 DIAGNOSIS — D649 Anemia, unspecified: Secondary | ICD-10-CM

## 2014-08-31 DIAGNOSIS — I10 Essential (primary) hypertension: Secondary | ICD-10-CM | POA: Diagnosis not present

## 2014-08-31 MED ORDER — ZOSTER VACCINE LIVE 19400 UNT/0.65ML ~~LOC~~ SOLR: 0.6500 mL | Freq: Once | SUBCUTANEOUS | Status: DC

## 2014-08-31 NOTE — Patient Instructions (Signed)

## 2014-08-31 NOTE — Progress Notes (Signed)
Pre visit review using our clinic review tool, if applicable. No additional management support is needed unless otherwise documented below in the visit note. 

## 2014-09-03 ENCOUNTER — Ambulatory Visit (INDEPENDENT_AMBULATORY_CARE_PROVIDER_SITE_OTHER): Payer: Medicare Other | Admitting: Pharmacist

## 2014-09-03 DIAGNOSIS — Z5181 Encounter for therapeutic drug level monitoring: Secondary | ICD-10-CM | POA: Diagnosis not present

## 2014-09-03 DIAGNOSIS — Z7901 Long term (current) use of anticoagulants: Secondary | ICD-10-CM | POA: Diagnosis not present

## 2014-09-03 DIAGNOSIS — I4891 Unspecified atrial fibrillation: Secondary | ICD-10-CM | POA: Diagnosis not present

## 2014-09-03 LAB — POCT INR: INR: 1.9

## 2014-09-16 ENCOUNTER — Other Ambulatory Visit: Payer: Self-pay | Admitting: Dermatology

## 2014-09-19 NOTE — Assessment & Plan Note (Signed)
Tolerating statin, encouraged heart healthy diet, avoid trans fats, minimize simple carbs and saturated fats. Increase exercise as tolerated 

## 2014-09-19 NOTE — Assessment & Plan Note (Signed)
Well controlled, no changes to meds. Encouraged heart healthy diet such as the DASH diet and exercise as tolerated.  °

## 2014-09-19 NOTE — Progress Notes (Signed)
Subjective:    Patient ID: [name] [name], female    DOB: November 06, 1924, 81 y.o.   MRN: [id number]  Chief Complaint  Patient presents with  . Follow-up    HPI Patient is in today for follow up. She is feeling fairly well. No recent illness. Is willing to take the flu shot today. Reports good appetite and activity levels. Denies CP/palp/SOB/HA/congestion/fevers/GI or GU c/o. Taking meds as prescribed  Past Medical History  Diagnosis Date  . Paroxysmal a-fib   . Mitral and aortic regurgitation   . Mitral valve prolapse     hx of  . Hypertension   . Hyperlipidemia   . Vitamin D deficiency   . Pneumonia   . Tracheitis   . Gait difficulty   . Anemia   . Melena   . IBS (irritable bowel syndrome)   . Incontinence   . GERD (gastroesophageal reflux disease)   . Osteopenia   . Thyroid disease     hypo  . Personal history of colonic polyps   . Current use of long term anticoagulation   . Cough   . Diarrhea   . Pneumonia     organism unspecified  . Constipation   . UTI (lower urinary tract infection)   . Unspecified adverse effect of other drug, medicinal and biological substance(995.29)   . History of mammogram 2009  . Diverticulosis   . Hyperlipidemia   . Atrial fibrillation   . Arthritis     hips, knees  . Headache(784.0) 06/22/2012  . Renal insufficiency 06/28/2012  . Hyperkalemia 06/28/2012  . Anxiety   . Hypertension   . Medicare annual wellness visit, subsequent 02/05/2014  . Dysrhythmia     a-fib    Past Surgical History  Procedure Laterality Date  . Breast biopsy Left   . Cardiac electrophysiology mapping and ablation    . Partial hysterectomy      ovaries left in place  . Cataract extraction, bilateral    . Esophagoscopy w/ botox injection    . Esophagogastroduodenoscopy N/A 09/03/2012    Procedure: ESOPHAGOGASTRODUODENOSCOPY (EGD);  Surgeon: Inda Castle, MD;  Location: [facility];  Service: Endoscopy;  Laterality: N/A;  . Botox injection N/A 09/03/2012    Procedure: BOTOX INJECTION;  Surgeon: Inda Castle, MD;  Location: [facility];  Service: Endoscopy;  Laterality: N/A;  . I&d extremity Right 11/06/2012    Procedure: IRRIGATION AND DEBRIDEMENT EXTREMITY Right Ring Finger;  Surgeon: Tennis Must, MD;  Location: [facility];  Service: Orthopedics;  Laterality: Right;  . Breast lumpectomy Right 08/18/2014    Procedure: RIGHT BREAST LUMPECTOMY;  Surgeon: Coralie Keens, MD;  Location: [place];  Service: General;  Laterality: Right;    Family History  Problem Relation Age of Onset  . Diabetes Mother   . CVA Mother   . COPD Father   . Rheum arthritis Father   . Heart disease Maternal Aunt   . Heart disease Maternal Uncle   . Allergies Daughter   . COPD Daughter   . Stroke Daughter   . COPD Daughter     previous smoker  . Stroke Maternal Grandfather   . Colon cancer Neg Hx   . Colon polyps Neg Hx   . Esophageal cancer Neg Hx   . Gallbladder disease Neg Hx   . Kidney disease Neg Hx     Social History   Social History  . Marital Status: Widowed    Spouse Name: N/A  . Number  of Children: 5  . Years of Education: N/A   Occupational History  . retired    Social History Main Topics  . Smoking status: Never Smoker   . Smokeless tobacco: Never Used  . Alcohol Use: No  . Drug Use: No  . Sexual Activity: No     Comment: lives with Daughter, grandson and his family. no dietary restricitons.   Other Topics Concern  . Not on file   Social History Narrative    Outpatient Prescriptions Prior to Visit  Medication Sig Dispense Refill  . ALPRAZolam (XANAX) 0.25 MG tablet TAKE 1 TABLET BY MOUTH 3 TIMES A DAY AS NEEDED ANXIETY 90 tablet 1  . atorvastatin (LIPITOR) 10 MG tablet TAKE 1 TABLET (10 MG TOTAL) BY MOUTH DAILY AT 6 PM. 30 tablet 4  . Cholecalciferol (VITAMIN D) 2000 UNITS CAPS Take 2,000 Units by mouth every other day.    . metoprolol tartrate (LOPRESSOR) 25 MG tablet Take 0.5 tablets (12.5 mg total) by  mouth 2 (two) times daily. bid 90 tablet 1  . pantoprazole (PROTONIX) 40 MG tablet TAKE 1 TABLET (40 MG TOTAL) BY MOUTH DAILY. 30 tablet 8  . ramipril (ALTACE) 10 MG capsule Take 1 capsule (10 mg total) by mouth daily. 90 capsule 3  . vitamin B-12 (CYANOCOBALAMIN) 1000 MCG tablet Take 1 tablet (1,000 mcg total) by mouth daily.    Marland Kitchen warfarin (COUMADIN) 2.5 MG tablet TAKE AS DIRECTED BY ANTICOAGULATION CLINIC 40 tablet 3  . fluticasone (FLONASE) 50 MCG/ACT nasal spray PLACE 2 SPRAYS INTO BOTH NOSTRILS DAILY. (Patient not taking: Reported on 08/31/2014) 16 g 6  . HYDROcodone-acetaminophen (NORCO) 5-325 MG per tablet Take 1 tablet by mouth every 6 (six) hours as needed for severe pain. 20 tablet 0   No facility-administered medications prior to visit.    Allergies  Allergen Reactions  . Darifenacin Hydrobromide     REACTION: causes her dizziness  . Sulfonamide Derivatives   . Tramadol Other (See Comments)    Insomnia, anorexia    Review of Systems  Constitutional: Negative for fever and malaise/fatigue.  HENT: Negative for congestion.   Eyes: Negative for discharge.  Respiratory: Negative for shortness of breath.   Cardiovascular: Negative for chest pain, palpitations and leg swelling.  Gastrointestinal: Negative for nausea and abdominal pain.  Genitourinary: Negative for dysuria.  Musculoskeletal: Negative for falls.  Skin: Negative for rash.  Neurological: Negative for loss of consciousness and headaches.  Endo/Heme/Allergies: Negative for environmental allergies.  Psychiatric/Behavioral: Negative for depression. The patient is not nervous/anxious.        Objective:    Physical Exam  Constitutional: She is oriented to person, place, and time. She appears well-developed and well-nourished. No distress.  HENT:  Head: Normocephalic and atraumatic.  Nose: Nose normal.  Eyes: Right eye exhibits no discharge. Left eye exhibits no discharge.  Neck: Normal range of motion. Neck  supple.  Cardiovascular: Normal rate and regular rhythm.   No murmur heard. Pulmonary/Chest: Effort normal and breath sounds normal.  Abdominal: Soft. Bowel sounds are normal. There is no tenderness.  Musculoskeletal: She exhibits no edema.  Neurological: She is alert and oriented to person, place, and time.  Skin: Skin is warm and dry.  Psychiatric: She has a normal mood and affect.  Nursing note and vitals reviewed.   BP 130/80 mmHg  Pulse 72  Temp(Src) 97.9 F (36.6 C) (Oral)  Ht 4\' 11"  (1.499 m)  Wt 114 lb (51.71 kg)  BMI 23.01 kg/m2  SpO2 97% Wt Readings from Last 3 Encounters:  08/31/14 114 lb (51.71 kg)  08/18/14 111 lb (50.349 kg)  08/04/14 116 lb 3.2 oz (52.708 kg)     Lab Results  Component Value Date   WBC 5.3 02/05/2014   HGB 11.5* 08/18/2014   HCT 36.8 02/05/2014   PLT 209 02/05/2014   GLUCOSE 97 08/17/2014   CHOL 117 02/05/2014   TRIG 171* 02/05/2014   HDL 32* 02/05/2014   LDLDIRECT 72.1 01/19/2009   LDLCALC 51 02/05/2014   ALT 9 02/05/2014   AST 15 02/05/2014   NA 141 08/17/2014   K 4.3 08/17/2014   CL 104 08/17/2014   CREATININE 1.25* 08/17/2014   BUN 14 08/17/2014   CO2 28 08/17/2014   TSH 2.709 02/05/2014   INR 1.9 09/03/2014   HGBA1C 6.2 08/29/2010    Lab Results  Component Value Date   TSH 2.709 02/05/2014   Lab Results  Component Value Date   WBC 5.3 02/05/2014   HGB 11.5* 08/18/2014   HCT 36.8 02/05/2014   MCV 90.2 02/05/2014   PLT 209 02/05/2014   Lab Results  Component Value Date   NA 141 08/17/2014   K 4.3 08/17/2014   CO2 28 08/17/2014   GLUCOSE 97 08/17/2014   BUN 14 08/17/2014   CREATININE 1.25* 08/17/2014   BILITOT 0.5 02/05/2014   ALKPHOS 16* 02/05/2014   AST 15 02/05/2014   ALT 9 02/05/2014   PROT 6.5 02/05/2014   ALBUMIN 3.7 02/05/2014   CALCIUM 9.2 08/17/2014   ANIONGAP 9 08/17/2014   GFR 46.49* 03/20/2013   Lab Results  Component Value Date   CHOL 117 02/05/2014   Lab Results  Component Value Date    HDL 32* 02/05/2014   Lab Results  Component Value Date   LDLCALC 51 02/05/2014   Lab Results  Component Value Date   TRIG 171* 02/05/2014   Lab Results  Component Value Date   CHOLHDL 3.7 02/05/2014   Lab Results  Component Value Date   HGBA1C 6.2 08/29/2010       Assessment & Plan:   Problem List Items Addressed This Visit    Hyperlipidemia, mixed    Tolerating statin, encouraged heart healthy diet, avoid trans fats, minimize simple carbs and saturated fats. Increase exercise as tolerated      Essential hypertension    Well controlled, no changes to meds. Encouraged heart healthy diet such as the DASH diet and exercise as tolerated.       Anemia    Increase leafy greens, consider increased lean red meat and using cast iron cookware. Continue to monitor, report any concerns       Other Visit Diagnoses    Need for viral immunization    -  Primary    Relevant Medications    zoster vaccine live, PF, (ZOSTAVAX) 91478 UNT/0.65ML injection    Encounter for immunization        Need for vaccination with 13-polyvalent pneumococcal conjugate vaccine        Relevant Orders    Pneumococcal conjugate vaccine 13-valent (Completed)       I have discontinued [name] [name]'s HYDROcodone-acetaminophen. I am also having her start on zoster vaccine live (PF). Additionally, I am having her maintain her vitamin B-12, Vitamin D, fluticasone, ramipril, pantoprazole, atorvastatin, metoprolol tartrate, warfarin, and ALPRAZolam.  Meds ordered this encounter  Medications  . zoster vaccine live, PF, (ZOSTAVAX) 29562 UNT/0.65ML injection    Sig: Inject 19,400 Units into the skin  once.    Dispense:  1 each    Refill:  0     Penni Homans, MD

## 2014-09-19 NOTE — Assessment & Plan Note (Signed)
Increase leafy greens, consider increased lean red meat and using cast iron cookware. Continue to monitor, report any concerns 

## 2014-09-28 ENCOUNTER — Other Ambulatory Visit: Payer: Self-pay | Admitting: Family Medicine

## 2014-09-28 MED ORDER — PANTOPRAZOLE SODIUM 40 MG PO TBEC: DELAYED_RELEASE_TABLET | ORAL | Status: DC

## 2014-09-30 ENCOUNTER — Other Ambulatory Visit: Payer: Self-pay | Admitting: Family Medicine

## 2014-09-30 MED ORDER — PANTOPRAZOLE SODIUM 40 MG PO TBEC: DELAYED_RELEASE_TABLET | ORAL | Status: DC

## 2014-09-30 MED ORDER — ATORVASTATIN CALCIUM 10 MG PO TABS: ORAL_TABLET | ORAL | Status: DC

## 2014-10-05 ENCOUNTER — Ambulatory Visit (INDEPENDENT_AMBULATORY_CARE_PROVIDER_SITE_OTHER): Payer: Medicare Other | Admitting: *Deleted

## 2014-10-05 DIAGNOSIS — Z5181 Encounter for therapeutic drug level monitoring: Secondary | ICD-10-CM

## 2014-10-05 DIAGNOSIS — Z7901 Long term (current) use of anticoagulants: Secondary | ICD-10-CM | POA: Diagnosis not present

## 2014-10-05 DIAGNOSIS — I4891 Unspecified atrial fibrillation: Secondary | ICD-10-CM | POA: Diagnosis not present

## 2014-10-05 LAB — POCT INR: INR: 3

## 2014-10-08 ENCOUNTER — Ambulatory Visit (INDEPENDENT_AMBULATORY_CARE_PROVIDER_SITE_OTHER): Payer: Medicare Other | Admitting: Cardiology

## 2014-10-08 ENCOUNTER — Encounter: Payer: Self-pay | Admitting: Cardiology

## 2014-10-08 VITALS — BP 150/70 | HR 79 | Ht 59.0 in | Wt 113.0 lb

## 2014-10-08 DIAGNOSIS — I10 Essential (primary) hypertension: Secondary | ICD-10-CM

## 2014-10-08 DIAGNOSIS — E785 Hyperlipidemia, unspecified: Secondary | ICD-10-CM | POA: Diagnosis not present

## 2014-10-08 DIAGNOSIS — I483 Typical atrial flutter: Secondary | ICD-10-CM

## 2014-10-08 NOTE — Patient Instructions (Signed)
Medication Instructions:  Your physician recommends that you continue on your current medications as directed. Please refer to the Current Medication list given to you today.   Labwork: NONE  Testing/Procedures: NONE  Follow-Up: Your physician wants you to follow-up in: [place] will receive a reminder letter in the mail two months in advance. If you don't receive a letter, please call our office to schedule the follow-up appointment.  Any Other Special Instructions Will Be Listed Below (If Applicable).

## 2014-10-08 NOTE — Progress Notes (Signed)
Patient ID: [name] [name], female   DOB: March 08, 1924, 81 y.o.   MRN: [id number]      Patient Name: [name] [name] Date of Encounter: 10/08/2014  Primary Care Provider:  Penni Homans, MD Primary Cardiologist:  Dorothy Spark  Patient Profile  1 year follow up  Problem List   Past Medical History  Diagnosis Date  . Paroxysmal a-fib ([place])   . Mitral and aortic regurgitation   . Mitral valve prolapse     hx of  . Hypertension   . Hyperlipidemia   . Vitamin D deficiency   . Pneumonia   . Tracheitis   . Gait difficulty   . Anemia   . Melena   . IBS (irritable bowel syndrome)   . Incontinence   . GERD (gastroesophageal reflux disease)   . Osteopenia   . Thyroid disease     hypo  . Personal history of colonic polyps   . Current use of long term anticoagulation   . Cough   . Diarrhea   . Pneumonia     organism unspecified  . Constipation   . UTI (lower urinary tract infection)   . Unspecified adverse effect of other drug, medicinal and biological substance(995.29)   . History of mammogram 2009  . Diverticulosis   . Hyperlipidemia   . Atrial fibrillation ([place])   . Arthritis     hips, knees  . Headache(784.0) 06/22/2012  . Renal insufficiency 06/28/2012  . Hyperkalemia 06/28/2012  . Anxiety   . Hypertension   . Medicare annual wellness visit, subsequent 02/05/2014  . Dysrhythmia     a-fib   Past Surgical History  Procedure Laterality Date  . Breast biopsy Left   . Cardiac electrophysiology mapping and ablation    . Partial hysterectomy      ovaries left in place  . Cataract extraction, bilateral    . Esophagoscopy w/ botox injection    . Esophagogastroduodenoscopy N/A 09/03/2012    Procedure: ESOPHAGOGASTRODUODENOSCOPY (EGD);  Surgeon: Inda Castle, MD;  Location: [facility];  Service: Endoscopy;  Laterality: N/A;  . Botox injection N/A 09/03/2012    Procedure: BOTOX INJECTION;  Surgeon: Inda Castle, MD;  Location: [facility];  Service: Endoscopy;   Laterality: N/A;  . I&d extremity Right 11/06/2012    Procedure: IRRIGATION AND DEBRIDEMENT EXTREMITY Right Ring Finger;  Surgeon: Tennis Must, MD;  Location: [facility];  Service: Orthopedics;  Laterality: Right;  . Breast lumpectomy Right 08/18/2014    Procedure: RIGHT BREAST LUMPECTOMY;  Surgeon: Coralie Keens, MD;  Location: [facility];  Service: General;  Laterality: Right;    Allergies  Allergies  Allergen Reactions  . Darifenacin Hydrobromide     REACTION: causes her dizziness  . Sulfonamide Derivatives   . Tramadol Other (See Comments)    Insomnia, anorexia    HPI  [name] [name] returns today for evaluation and management of her history of paroxysmal A. fib, chronic persistent atrial flutter on chronic anticoagulation, history of mild aortic and mitral regurgitation, and history of mitral valve prolapse.  She is a former patient of Dr Verl Blalock, this is her follow up after 1 year. She feels very well, she is still very independent, driving, shopping. She only noticed getting more fatigued with exertion. No chest pain, shortness of breath, palpitations or syncope.  10/08/14 - 1 year follow up, echocardiogram in 2014 showed normal left ventricular function. There is in fact no aortic or mitral regurgitation. She was previously diagnosed with  those but they are not seen anymore. There is moderate tricuspid regurgitation and mild pulmonary hypertension.   She is still very independent and denies chest pain or SOB, no palpitations or syncope. Occasional dizziness. She developed right nipple bleeding and underwent breast biopsy that showed no cancer. Complaint with her meds including warfain, falls. She has mild LE edema B/L, no orthopnea or PND.    Home Medications  Prior to Admission medications   Medication Sig Start Date End Date Taking? Authorizing Provider  warfarin (COUMADIN) 2.5 MG tablet Take 2.5 mg by mouth as directed. Mon, wed, Friday 1.5 tab 08/15/12  Yes Renella Cunas, MD  ALPRAZolam Duanne Moron) 0.25 MG tablet Take 1 tablet (0.25 mg total) by mouth 3 (three) times daily. For stress 06/20/12 06/20/13  Mosie Lukes, MD  atorvastatin (LIPITOR) 10 MG tablet Take 1 tablet (10 mg total) by mouth daily. 05/11/12   Mosie Lukes, MD  calcium acetate (PHOSLO) 667 MG tablet Take 1 tablet by mouth 2 (two) times daily. 10/08/12   Mosie Lukes, MD  Cholecalciferol (VITAMIN D) 2000 UNITS CAPS Take 2,000 Units by mouth every other day. 02/20/12   Mosie Lukes, MD  metoprolol tartrate (LOPRESSOR) 25 MG tablet Take 0.5 tablets (12.5 mg total) by mouth 2 (two) times daily. bid 02/20/12   Mosie Lukes, MD  omeprazole-sodium bicarbonate (ZEGERID) 40-1100 MG per capsule Take one tab at bedtime 08/01/12   Inda Castle, MD  ramipril (ALTACE) 10 MG capsule Take 1 capsule (10 mg total) by mouth daily. 09/25/12   Mosie Lukes, MD  vitamin B-12 (CYANOCOBALAMIN) 1000 MCG tablet Take 1 tablet (1,000 mcg total) by mouth daily. 02/20/12   Mosie Lukes, MD    Family History  Family History  Problem Relation Age of Onset  . Diabetes Mother   . CVA Mother   . COPD Father   . Rheum arthritis Father   . Heart disease Maternal Aunt   . Heart disease Maternal Uncle   . Allergies Daughter   . COPD Daughter   . Stroke Daughter   . COPD Daughter     previous smoker  . Stroke Maternal Grandfather   . Colon cancer Neg Hx   . Colon polyps Neg Hx   . Esophageal cancer Neg Hx   . Gallbladder disease Neg Hx   . Kidney disease Neg Hx   . Stroke Mother   . Heart attack Neg Hx   . Hypertension Neg Hx     Social History  Social History   Social History  . Marital Status: Widowed    Spouse Name: N/A  . Number of Children: 5  . Years of Education: N/A   Occupational History  . retired    Social History Main Topics  . Smoking status: Never Smoker   . Smokeless tobacco: Never Used  . Alcohol Use: No  . Drug Use: No  . Sexual Activity: No     Comment: lives with Daughter,  grandson and his family. no dietary restricitons.   Other Topics Concern  . Not on file   Social History Narrative     Review of Systems General:  No chills, fever, night sweats or weight changes.  Cardiovascular:  No chest pain, dyspnea on exertion, edema, orthopnea, palpitations, paroxysmal nocturnal dyspnea. Dermatological: No rash, lesions/masses Respiratory: No cough, dyspnea Urologic: No hematuria, dysuria Abdominal:   No nausea, vomiting, diarrhea, bright red blood per rectum, melena, or hematemesis Neurologic:  No visual changes, wkns, changes in mental status. All other systems reviewed and are otherwise negative except as noted above.  Physical Exam  BP 156/70, HR 60 General: Pleasant, NAD Psych: Normal affect. Neuro: Alert and oriented X 3. Moves all extremities spontaneously. HEENT: Normal  Neck: Supple without bruits or JVD. Lungs:  Resp regular and unlabored, CTA. Heart: RRR no s3, s4, 2/6 systolic murmurs. Abdomen: Soft, non-tender, non-distended, BS + x 4.  Extremities: No clubbing, cyanosis, mild B/L LE edema around ankles. DP/PT/Radials 2+ and equal bilaterally.  Accessory Clinical Findings  ECG - A-fib, 60 BPM  Lipid Panel     Component Value Date/Time   CHOL 117 02/05/2014 1501   TRIG 171* 02/05/2014 1501   HDL 32* 02/05/2014 1501   CHOLHDL 3.7 02/05/2014 1501   VLDL 34 02/05/2014 1501   LDLCALC 51 02/05/2014 1501   ECHO: 10/27/2013 Study Conclusions  - Left ventricle: The cavity size was normal. Wall thickness was normal. Systolic function was normal. The estimated ejection fraction was in the range of 55% to 60%. Wall motion was normal; there were no regional wall motion abnormalities. - Left atrium: The atrium was mildly dilated. - Right atrium: The atrium was mildly dilated. - Tricuspid valve: Moderate regurgitation. - Pulmonary arteries: Systolic pressure was mildly increased. PA peak pressure: 59mm Hg (S).  ECG: Atrial flutter with  3:1 conduction, unchanged fom 10/09/2013    Assessment & Plan  81 year old female with h/o HTN, AI and MR, mitral valve prolapse  1. Chronic persistent atrial flutter - rate controlled, asymptomatic, on warfarin, metoprolol  2. Atrial fibrillation - paroxysmal, on low dose metoprolol, warfarin - no side effects, no falls, she is very independent, we will continue for now and reevaluate the next year.   3. Hypertension - repeat 140, ok for her age  79. Lipid profile - low HDL, high TAG, we will increase Crestor to 20 mg QHS  5. Moderate TR, mild pulmonary HTN - asymptomatic  Follow up in 1 year.    Dorothy Spark, MD 10/08/2014, 9:59 AM

## 2014-10-10 ENCOUNTER — Other Ambulatory Visit: Payer: Self-pay | Admitting: Family Medicine

## 2014-10-15 ENCOUNTER — Ambulatory Visit: Payer: Medicare Other | Admitting: Cardiology

## 2014-10-26 ENCOUNTER — Other Ambulatory Visit: Payer: Self-pay | Admitting: Family Medicine

## 2014-10-26 NOTE — Telephone Encounter (Signed)
Requesting--  alprazolam Contract--   Signed 02/05/14 UDS--   Done low risk Last OV--   08/31/14 Last Refill--     #90 with 1 refill on 08/17/14  Please Advise

## 2014-10-26 NOTE — Telephone Encounter (Signed)
Faxed hardcopy for Alprazolam to [facility] [place] 

## 2014-11-05 ENCOUNTER — Ambulatory Visit (INDEPENDENT_AMBULATORY_CARE_PROVIDER_SITE_OTHER): Payer: Medicare Other | Admitting: *Deleted

## 2014-11-05 DIAGNOSIS — Z5181 Encounter for therapeutic drug level monitoring: Secondary | ICD-10-CM

## 2014-11-05 DIAGNOSIS — I4891 Unspecified atrial fibrillation: Secondary | ICD-10-CM | POA: Diagnosis not present

## 2014-11-05 DIAGNOSIS — Z7901 Long term (current) use of anticoagulants: Secondary | ICD-10-CM

## 2014-11-05 LAB — POCT INR: INR: 2.5

## 2014-11-05 IMAGING — US US EXTREM LOW VENOUS*L*
1 series · 13 of 24 positions shown · non-contrast
Comparison: None.

CLINICAL DATA: Patient completed a course of Levaquin for pneumonia
atrial fibrillation. She presents now with acute left calf pain and
swelling.



[Series 1: us extrem low venous*left* · 0.07mm/px · 13 of 41 slices shown]
[im 1/41]
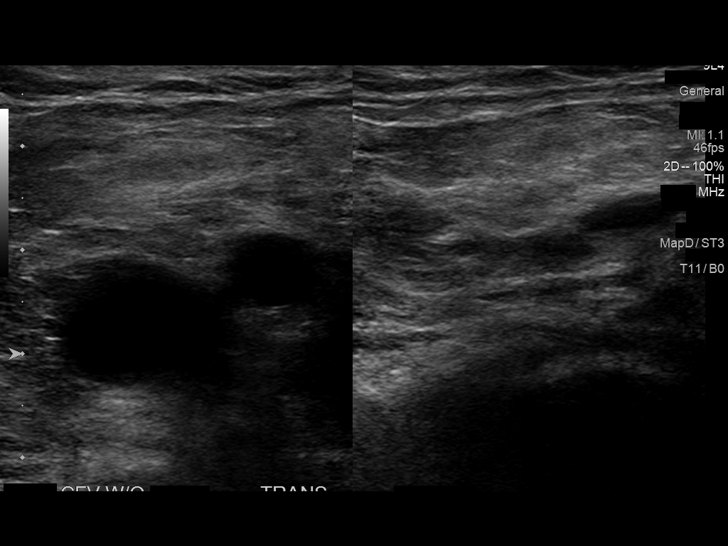
[im 4/41]
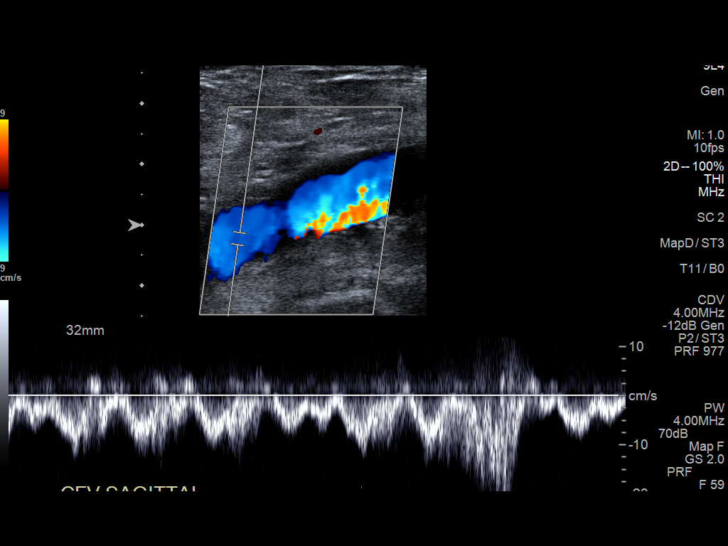
[im 7/41]
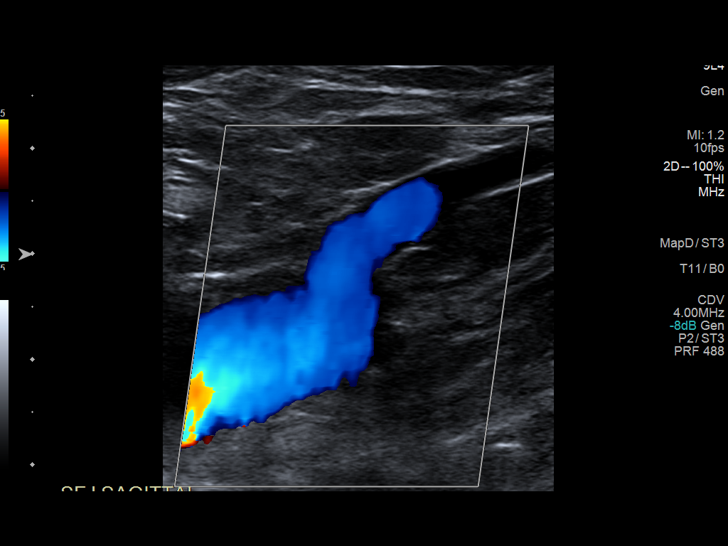
[im 11/41]
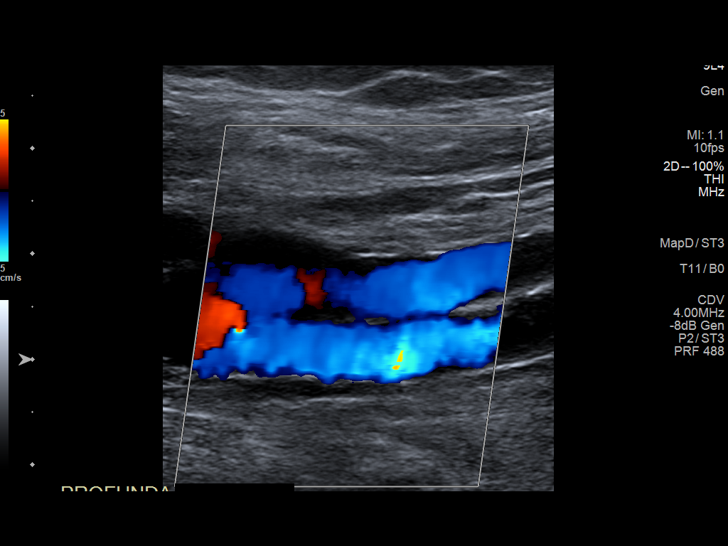
[im 14/41]
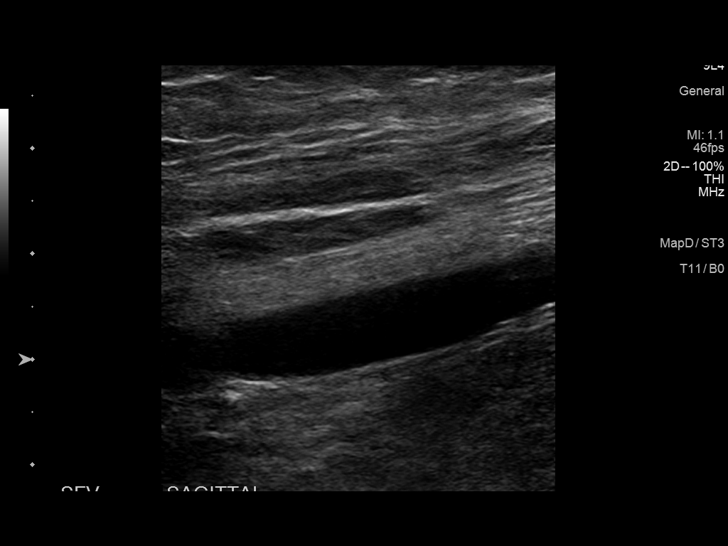
[im 18/41]
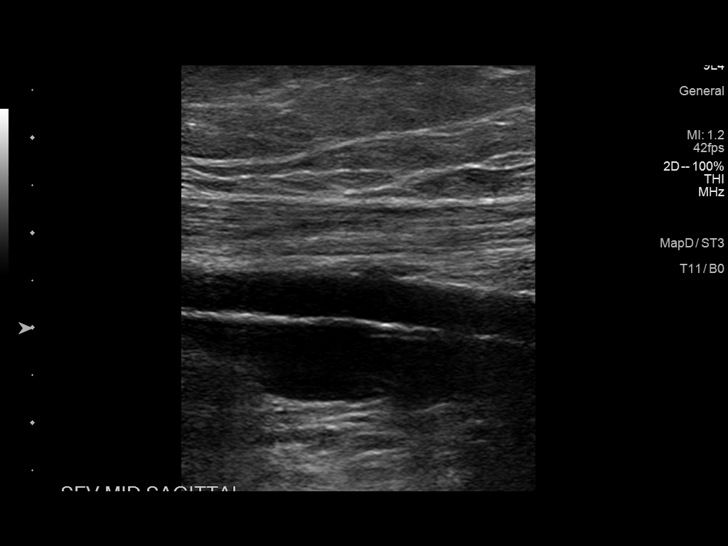
[im 21/41]
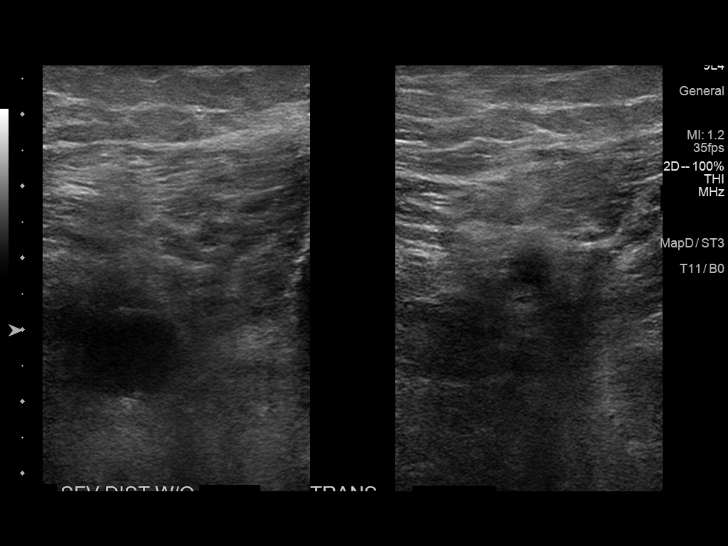
[im 23/41]
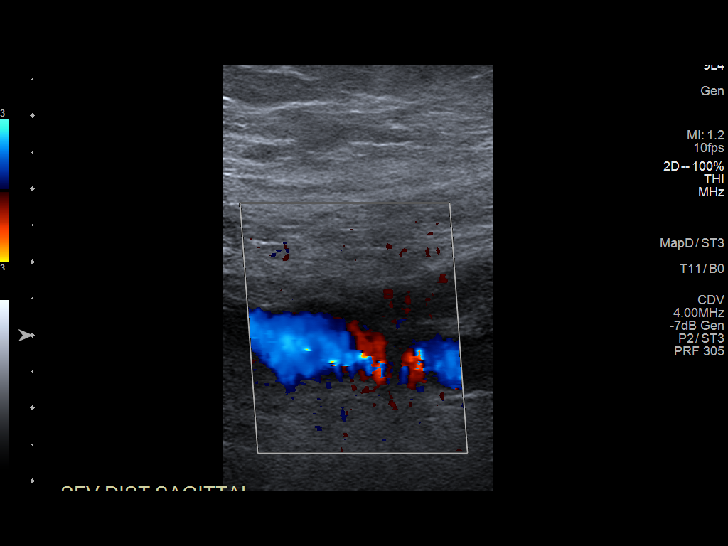
[im 27/41]
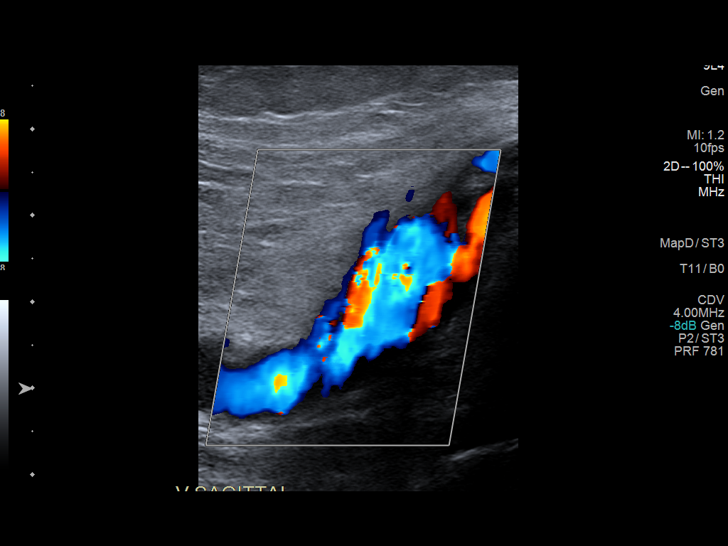
[im 30/41]
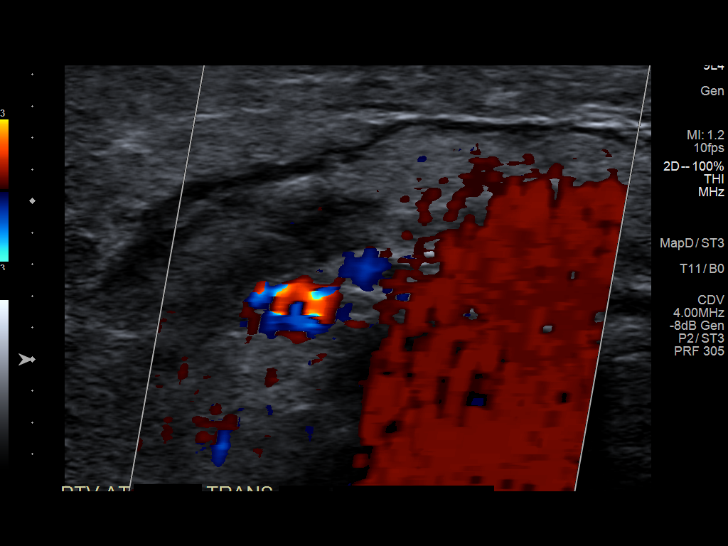
[im 34/41]
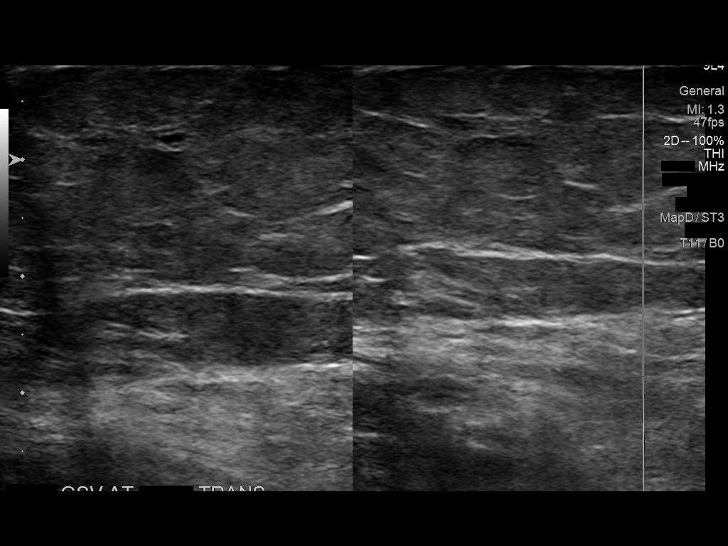
[im 37/41]
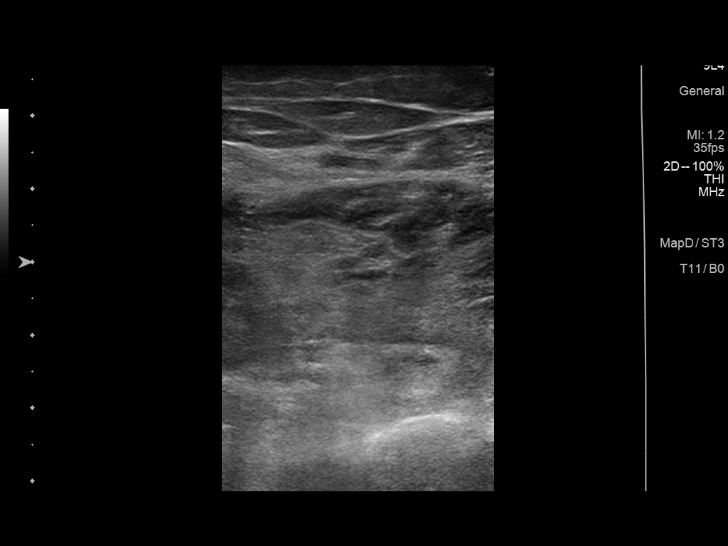
[im 41/41]
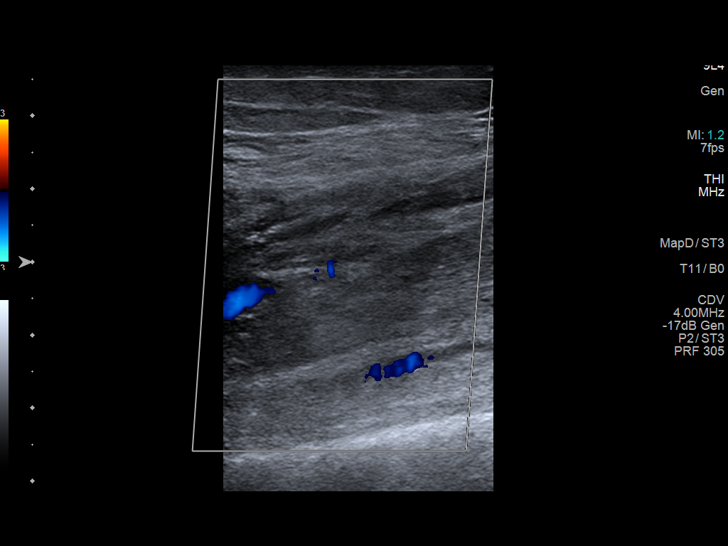

[13 of 24 positions shown; findings below may reference images not displayed]

FINDINGS: Common Femoral Vein: No evidence of thrombus. Normal
compressibility, respiratory phasicity and response to augmentation.

Saphenofemoral Junction: No evidence of thrombus. Normal
compressibility and flow on color Doppler imaging.

Profunda Femoral Vein: No evidence of thrombus. Normal
compressibility and flow on color Doppler imaging.

Femoral Vein: No evidence of thrombus. Normal compressibility,
respiratory phasicity and response to augmentation.

Popliteal Vein: No evidence of thrombus. Normal compressibility,
respiratory phasicity and response to augmentation.

Calf Veins: No evidence of thrombus. Normal compressibility and flow
on color Doppler imaging.

Superficial Great Saphenous Vein: No evidence of thrombus. Normal
compressibility.

Venous Reflux:  Not examined.

Other Findings:  None.
IMPRESSION: No evidence of left lower extremity DVT.

## 2014-11-15 ENCOUNTER — Ambulatory Visit (INDEPENDENT_AMBULATORY_CARE_PROVIDER_SITE_OTHER): Payer: Medicare Other | Admitting: Physician Assistant

## 2014-11-15 ENCOUNTER — Encounter: Payer: Self-pay | Admitting: Physician Assistant

## 2014-11-15 VITALS — BP 148/84 | HR 72 | Temp 98.1°F | Resp 14 | Ht 59.0 in | Wt 113.4 lb

## 2014-11-15 DIAGNOSIS — M546 Pain in thoracic spine: Secondary | ICD-10-CM | POA: Insufficient documentation

## 2014-11-15 NOTE — Patient Instructions (Signed)
Please avoid heavy lifting or overexertion. You can take 2 regular tylenol (325 mg tablets) three times daily as needed for pain. Do not take more than that. Apply Salon Pas patch to side. You can also use a heating pad a couple of times per day.  Call or return to clinic if symptoms are not improving.

## 2014-11-15 NOTE — Progress Notes (Signed)
Patient presents to clinic today c/o 2 weeks of right-sided thoracic back pain has been episode of heavy lifting. Patient denies trauma. Patient denies pain radiating up his pain become constant over the past 3 days after another episode of heavy lifting.  Patient endorses pain is only present with movement. Patient denies pleuritic chest pain. Denies shortness of breath, palpitations, lightheadedness or dizziness. Patient denies dysuria, urinary urgency, urinary frequency, or hematuria. Endorses good bowel output  Past Medical History  Diagnosis Date  . Paroxysmal a-fib ([place])   . Mitral and aortic regurgitation   . Mitral valve prolapse     hx of  . Hypertension   . Hyperlipidemia   . Vitamin D deficiency   . Pneumonia   . Tracheitis   . Gait difficulty   . Anemia   . Melena   . IBS (irritable bowel syndrome)   . Incontinence   . GERD (gastroesophageal reflux disease)   . Osteopenia   . Thyroid disease     hypo  . Personal history of colonic polyps   . Current use of long term anticoagulation   . Cough   . Diarrhea   . Pneumonia     organism unspecified  . Constipation   . UTI (lower urinary tract infection)   . Unspecified adverse effect of other drug, medicinal and biological substance(995.29)   . History of mammogram 2009  . Diverticulosis   . Hyperlipidemia   . Atrial fibrillation ([facility])   . Arthritis     hips, knees  . Headache(784.0) 06/22/2012  . Renal insufficiency 06/28/2012  . Hyperkalemia 06/28/2012  . Anxiety   . Hypertension   . Medicare annual wellness visit, subsequent 02/05/2014  . Dysrhythmia     a-fib    Current Outpatient Prescriptions on File Prior to Visit  Medication Sig Dispense Refill  . ALPRAZolam (XANAX) 0.25 MG tablet TAKE 1 TABLET BY MOUTH 3 TIMES A DAY AS NEEDED 90 tablet 1  . atorvastatin (LIPITOR) 10 MG tablet TAKE 1 TABLET (10 MG TOTAL) BY MOUTH DAILY AT 6 PM. 90 tablet 2  . Cholecalciferol (VITAMIN D) 2000 UNITS CAPS Take 2,000 Units  by mouth every other day.    . fluticasone (FLONASE) 50 MCG/ACT nasal spray PLACE 2 SPRAYS INTO BOTH NOSTRILS DAILY. 16 g 6  . metoprolol tartrate (LOPRESSOR) 25 MG tablet Take 12.5 mg by mouth 2 (two) times daily.    . pantoprazole (PROTONIX) 40 MG tablet TAKE 1 TABLET (40 MG TOTAL) BY MOUTH DAILY. 90 tablet 3  . ramipril (ALTACE) 10 MG capsule Take 1 capsule (10 mg total) by mouth daily. 90 capsule 3  . vitamin B-12 (CYANOCOBALAMIN) 1000 MCG tablet Take 1 tablet (1,000 mcg total) by mouth daily.    Marland Kitchen warfarin (COUMADIN) 2.5 MG tablet TAKE AS DIRECTED BY ANTICOAGULATION CLINIC 40 tablet 3  . zoster vaccine live, PF, (ZOSTAVAX) 91478 UNT/0.65ML injection Inject 19,400 Units into the skin once. 1 each 0   No current facility-administered medications on file prior to visit.    Allergies  Allergen Reactions  . Darifenacin Hydrobromide     REACTION: causes her dizziness  . Sulfonamide Derivatives   . Tramadol Other (See Comments)    Insomnia, anorexia    Family History  Problem Relation Age of Onset  . Diabetes Mother   . CVA Mother   . COPD Father   . Rheum arthritis Father   . Heart disease Maternal Aunt   . Heart disease Maternal Uncle   .  Allergies Daughter   . COPD Daughter   . Stroke Daughter   . COPD Daughter     previous smoker  . Stroke Maternal Grandfather   . Colon cancer Neg Hx   . Colon polyps Neg Hx   . Esophageal cancer Neg Hx   . Gallbladder disease Neg Hx   . Kidney disease Neg Hx   . Stroke Mother   . Heart attack Neg Hx   . Hypertension Neg Hx     Social History   Social History  . Marital Status: Widowed    Spouse Name: N/A  . Number of Children: 5  . Years of Education: N/A   Occupational History  . retired    Social History Main Topics  . Smoking status: Never Smoker   . Smokeless tobacco: Never Used  . Alcohol Use: No  . Drug Use: No  . Sexual Activity: No     Comment: lives with Daughter, grandson and his family. no dietary  restricitons.   Other Topics Concern  . None   Social History Narrative   Review of Systems - See HPI.  All other ROS are negative.  BP 148/84 mmHg  Pulse 72  Temp(Src) 98.1 F (36.7 C) (Oral)  Resp 14  Ht 4\' 11"  (1.499 m)  Wt 113 lb 6 oz (51.427 kg)  BMI 22.89 kg/m2  SpO2 98%  Physical Exam  Constitutional: She is oriented to person, place, and time and well-developed, well-nourished, and in no distress.  HENT:  Head: Normocephalic and atraumatic.  Eyes: Conjunctivae are normal.  Cardiovascular: Normal rate, regular rhythm, normal heart sounds and intact distal pulses.   Pulmonary/Chest: Effort normal and breath sounds normal. No respiratory distress. She has no wheezes. She has no rales. She exhibits no tenderness.  Musculoskeletal:       Thoracic back: She exhibits tenderness and pain. She exhibits normal range of motion, no bony tenderness, no spasm and normal pulse.  Pain reproduced with range of motion.  Neurological: She is alert and oriented to person, place, and time.  Skin: Skin is warm and dry. No rash noted.  Psychiatric: Affect normal.  Vitals reviewed.   Recent Results (from the past 2160 hour(s))  Hemoglobin-hemacue, POC     Status: Abnormal   Collection Time: 08/18/14 10:49 AM  Result Value Ref Range   Hemoglobin 11.5 (L) 12.0 - 15.0 g/dL  POCT INR     Status: None   Collection Time: 09/03/14 10:24 AM  Result Value Ref Range   INR 1.9   POCT INR     Status: None   Collection Time: 10/05/14 11:18 AM  Result Value Ref Range   INR 3.0   POCT INR     Status: None   Collection Time: 11/05/14 11:17 AM  Result Value Ref Range   INR 2.5     Assessment/Plan: Right-sided thoracic back pain Patient currently on Coumadin therapy. Proper use of Tylenol discussed with patient. Regimen given. Recommended topical salon pas patch and heating pads. Avoid heavy lifting or over exertion. Follow-up if symptoms are not resolving.

## 2014-11-15 NOTE — Assessment & Plan Note (Signed)
Patient currently on Coumadin therapy. Proper use of Tylenol discussed with patient. Regimen given. Recommended topical salon pas patch and heating pads. Avoid heavy lifting or over exertion. Follow-up if symptoms are not resolving.

## 2014-11-24 ENCOUNTER — Other Ambulatory Visit: Payer: Self-pay | Admitting: Cardiology

## 2014-12-03 ENCOUNTER — Ambulatory Visit (INDEPENDENT_AMBULATORY_CARE_PROVIDER_SITE_OTHER): Payer: Medicare Other | Admitting: *Deleted

## 2014-12-03 DIAGNOSIS — I4891 Unspecified atrial fibrillation: Secondary | ICD-10-CM | POA: Diagnosis not present

## 2014-12-03 DIAGNOSIS — Z5181 Encounter for therapeutic drug level monitoring: Secondary | ICD-10-CM | POA: Diagnosis not present

## 2014-12-03 DIAGNOSIS — Z7901 Long term (current) use of anticoagulants: Secondary | ICD-10-CM

## 2014-12-03 LAB — POCT INR: INR: 2.7

## 2015-01-10 ENCOUNTER — Other Ambulatory Visit: Payer: Self-pay | Admitting: Family Medicine

## 2015-01-10 NOTE — Telephone Encounter (Signed)
Faxed hardcopy for Alprazolam to [facility] [place] 

## 2015-01-14 ENCOUNTER — Ambulatory Visit (INDEPENDENT_AMBULATORY_CARE_PROVIDER_SITE_OTHER): Payer: Medicare Other | Admitting: *Deleted

## 2015-01-14 DIAGNOSIS — Z5181 Encounter for therapeutic drug level monitoring: Secondary | ICD-10-CM | POA: Diagnosis not present

## 2015-01-14 DIAGNOSIS — Z7901 Long term (current) use of anticoagulants: Secondary | ICD-10-CM | POA: Diagnosis not present

## 2015-01-14 DIAGNOSIS — I4891 Unspecified atrial fibrillation: Secondary | ICD-10-CM | POA: Diagnosis not present

## 2015-01-14 LAB — POCT INR: INR: 2.3

## 2015-01-23 ENCOUNTER — Other Ambulatory Visit: Payer: Self-pay | Admitting: Family Medicine

## 2015-01-26 ENCOUNTER — Emergency Department (HOSPITAL_BASED_OUTPATIENT_CLINIC_OR_DEPARTMENT_OTHER)
Admission: EM | Admit: 2015-01-26 | Discharge: 2015-01-27 | Disposition: A | Discharge status: Home / Self Care | Payer: Medicare Other | Attending: Emergency Medicine | Admitting: Emergency Medicine

## 2015-01-26 ENCOUNTER — Emergency Department (HOSPITAL_BASED_OUTPATIENT_CLINIC_OR_DEPARTMENT_OTHER): Payer: Medicare Other

## 2015-01-26 ENCOUNTER — Encounter (HOSPITAL_BASED_OUTPATIENT_CLINIC_OR_DEPARTMENT_OTHER): Payer: Self-pay

## 2015-01-26 DIAGNOSIS — F419 Anxiety disorder, unspecified: Secondary | ICD-10-CM | POA: Insufficient documentation

## 2015-01-26 DIAGNOSIS — Z79899 Other long term (current) drug therapy: Secondary | ICD-10-CM | POA: Insufficient documentation

## 2015-01-26 DIAGNOSIS — Z8701 Personal history of pneumonia (recurrent): Secondary | ICD-10-CM | POA: Insufficient documentation

## 2015-01-26 DIAGNOSIS — E559 Vitamin D deficiency, unspecified: Secondary | ICD-10-CM | POA: Diagnosis not present

## 2015-01-26 DIAGNOSIS — K219 Gastro-esophageal reflux disease without esophagitis: Secondary | ICD-10-CM | POA: Diagnosis not present

## 2015-01-26 DIAGNOSIS — I10 Essential (primary) hypertension: Secondary | ICD-10-CM | POA: Diagnosis not present

## 2015-01-26 DIAGNOSIS — E785 Hyperlipidemia, unspecified: Secondary | ICD-10-CM | POA: Diagnosis not present

## 2015-01-26 DIAGNOSIS — Z7951 Long term (current) use of inhaled steroids: Secondary | ICD-10-CM | POA: Diagnosis not present

## 2015-01-26 DIAGNOSIS — I480 Paroxysmal atrial fibrillation: Secondary | ICD-10-CM | POA: Insufficient documentation

## 2015-01-26 DIAGNOSIS — Z87448 Personal history of other diseases of urinary system: Secondary | ICD-10-CM | POA: Insufficient documentation

## 2015-01-26 DIAGNOSIS — D649 Anemia, unspecified: Secondary | ICD-10-CM | POA: Diagnosis not present

## 2015-01-26 DIAGNOSIS — K5792 Diverticulitis of intestine, part unspecified, without perforation or abscess without bleeding: Secondary | ICD-10-CM | POA: Diagnosis not present

## 2015-01-26 DIAGNOSIS — Z86010 Personal history of colonic polyps: Secondary | ICD-10-CM | POA: Insufficient documentation

## 2015-01-26 DIAGNOSIS — R1032 Left lower quadrant pain: Secondary | ICD-10-CM | POA: Diagnosis present

## 2015-01-26 DIAGNOSIS — M1990 Unspecified osteoarthritis, unspecified site: Secondary | ICD-10-CM | POA: Insufficient documentation

## 2015-01-26 DIAGNOSIS — Z87440 Personal history of urinary (tract) infections: Secondary | ICD-10-CM | POA: Diagnosis not present

## 2015-01-26 LAB — CBC WITH DIFFERENTIAL/PLATELET
BASOS ABS: 0 10*3/uL (ref 0.0–0.1)
Basophils Relative: 0 %
EOS ABS: 0 10*3/uL (ref 0.0–0.7)
Eosinophils Relative: 0 %
HCT: 35.5 % — ABNORMAL LOW (ref 36.0–46.0)
HEMOGLOBIN: 11.4 g/dL — AB (ref 12.0–15.0)
LYMPHS ABS: 1.7 10*3/uL (ref 0.7–4.0)
LYMPHS PCT: 19 %
MCH: 29.6 pg (ref 26.0–34.0)
MCHC: 32.1 g/dL (ref 30.0–36.0)
MCV: 92.2 fL (ref 78.0–100.0)
Monocytes Absolute: 0.7 10*3/uL (ref 0.1–1.0)
Monocytes Relative: 8 %
NEUTROS PCT: 73 %
Neutro Abs: 6.6 10*3/uL (ref 1.7–7.7)
Platelets: 160 10*3/uL (ref 150–400)
RBC: 3.85 MIL/uL — AB (ref 3.87–5.11)
RDW: 14.2 % (ref 11.5–15.5)
WBC: 9.1 10*3/uL (ref 4.0–10.5)

## 2015-01-26 LAB — COMPREHENSIVE METABOLIC PANEL
ALK PHOS: 15 U/L — AB (ref 38–126)
ALT: 10 U/L — AB (ref 14–54)
AST: 19 U/L (ref 15–41)
Albumin: 3.4 g/dL — ABNORMAL LOW (ref 3.5–5.0)
Anion gap: 9 (ref 5–15)
BUN: 16 mg/dL (ref 6–20)
CALCIUM: 8.6 mg/dL — AB (ref 8.9–10.3)
CHLORIDE: 106 mmol/L (ref 101–111)
CO2: 26 mmol/L (ref 22–32)
CREATININE: 1.06 mg/dL — AB (ref 0.44–1.00)
GFR calc non Af Amer: 45 mL/min — ABNORMAL LOW (ref >60)
GFR, EST AFRICAN AMERICAN: 52 mL/min — AB (ref >60)
Glucose, Bld: 122 mg/dL — ABNORMAL HIGH (ref 65–99)
Potassium: 3.7 mmol/L (ref 3.5–5.1)
SODIUM: 141 mmol/L (ref 135–145)
Total Bilirubin: 0.6 mg/dL (ref 0.3–1.2)
Total Protein: 6.5 g/dL (ref 6.5–8.1)

## 2015-01-26 LAB — URINALYSIS, ROUTINE W REFLEX MICROSCOPIC
GLUCOSE, UA: NEGATIVE mg/dL
Hgb urine dipstick: NEGATIVE
KETONES UR: NEGATIVE mg/dL
Nitrite: NEGATIVE
PH: 5.5 (ref 5.0–8.0)
PROTEIN: NEGATIVE mg/dL
Specific Gravity, Urine: 1.027 (ref 1.005–1.030)

## 2015-01-26 LAB — LIPASE, BLOOD: Lipase: 20 U/L (ref 11–51)

## 2015-01-26 LAB — URINE MICROSCOPIC-ADD ON

## 2015-01-26 LAB — PROTIME-INR
INR: 2.54 — ABNORMAL HIGH (ref 0.00–1.49)
PROTHROMBIN TIME: 27 s — AB (ref 11.6–15.2)

## 2015-01-26 MED ORDER — SODIUM CHLORIDE 0.9 % IV SOLN
INTRAVENOUS | Status: DC
  Administered 2015-01-27: 02:00:00 via INTRAVENOUS

## 2015-01-26 NOTE — ED Notes (Signed)
Pt attempted to give CCUA-unsuccessful-given CCUA kit for ED WR

## 2015-01-26 NOTE — ED Provider Notes (Signed)
By signing my name below, I, [name] [name], attest that this documentation has been prepared under the direction and in the presence of [name], DO. Electronically Signed: Doran [name], ED Scribe. 01/26/2015. 11:32 PM.  TIME SEEN: 11:08 PM  CHIEF COMPLAINT:  Chief Complaint  Patient presents with  . Abdominal Pain    HPI: HPI Comments: [name] [name] is a 82 y.o. female  with a h/o of HTN, HLD, A-fib on coumadin, rectocele who presents to the Emergency Department complaining of constant, unchanged LLQ abdominal pain that began last night. She reports her pain radiates across her abdomen. Pt had a similar episode 2 weeks ago but was not evaluated at that time. Pt denies any fevers, chills, nausea, vomiting, diarrhea, hematuria, dysuria, vaginal bleeding, vaginal discharge, blood in the stool, or melena.  Pt has a colonoscopy and found a polyp and diverticulosis. She has never had diverticulitis that she is aware of. Status post hysterectomy, BTL. No sick contacts or recent travel. Her gastroenterologist is Dr. [name] with [name].  Pt is being followed by Dr. [name] who is her PCP with [facility].  ROS: See HPI Constitutional: no fever  Eyes: no drainage  ENT: no runny nose   Cardiovascular:  no chest pain  Resp: no SOB  GI: no vomiting GU: no dysuria Integumentary: no rash  Allergy: no hives  Musculoskeletal: no leg swelling  Neurological: no slurred speech ROS otherwise negative  PAST MEDICAL HISTORY/PAST SURGICAL HISTORY:  Past Medical History  Diagnosis Date  . Paroxysmal a-fib ([place])   . Mitral and aortic regurgitation   . Mitral valve prolapse     hx of  . Hypertension   . Hyperlipidemia   . Vitamin D deficiency   . Pneumonia   . Tracheitis   . Gait difficulty   . Anemia   . Melena   . IBS (irritable bowel syndrome)   . Incontinence   . GERD (gastroesophageal reflux disease)   . Osteopenia   . Thyroid disease     hypo  . Personal history of colonic polyps    . Current use of long term anticoagulation   . Cough   . Diarrhea   . Pneumonia     organism unspecified  . Constipation   . UTI (lower urinary tract infection)   . Unspecified adverse effect of other drug, medicinal and biological substance(995.29)   . History of mammogram 2009  . Diverticulosis   . Hyperlipidemia   . Atrial fibrillation ([place])   . Arthritis     hips, knees  . Headache(784.0) 06/22/2012  . Renal insufficiency 06/28/2012  . Hyperkalemia 06/28/2012  . Anxiety   . Hypertension   . Medicare annual wellness visit, subsequent 02/05/2014  . Dysrhythmia     a-fib    MEDICATIONS:  Prior to Admission medications   Medication Sig Start Date End Date Taking? Authorizing Provider  acetaminophen (TYLENOL) 325 MG tablet Take 650 mg by mouth every 6 (six) hours as needed.    Historical Provider, MD  ALPRAZolam Duanne Moron) 0.25 MG tablet TAKE ONE TABLET BY MOUTH THREE TIMES A DAY AS NEEDED 01/10/15   Mosie Lukes, MD  atorvastatin (LIPITOR) 10 MG tablet TAKE 1 TABLET (10 MG TOTAL) BY MOUTH DAILY AT 6 PM. 09/30/14   Mosie Lukes, MD  Cholecalciferol (VITAMIN D) 2000 UNITS CAPS Take 2,000 Units by mouth every other day. 02/20/12   Mosie Lukes, MD  fluticasone (FLONASE) 50 MCG/ACT nasal spray PLACE 2 SPRAYS INTO  BOTH NOSTRILS DAILY. 09/12/13   Brunetta Jeans, PA-C  metoprolol tartrate (LOPRESSOR) 25 MG tablet Take 12.5 mg by mouth 2 (two) times daily.    Historical Provider, MD  pantoprazole (PROTONIX) 40 MG tablet TAKE 1 TABLET (40 MG TOTAL) BY MOUTH DAILY. 09/30/14   Mosie Lukes, MD  ramipril (ALTACE) 10 MG capsule TAKE 1 CAPSULE (10 MG TOTAL) BY MOUTH DAILY. 01/23/15   Mosie Lukes, MD  vitamin B-12 (CYANOCOBALAMIN) 1000 MCG tablet Take 1 tablet (1,000 mcg total) by mouth daily. 02/20/12   Mosie Lukes, MD  warfarin (COUMADIN) 2.5 MG tablet TAKE AS DIRECTED BY ANTICOAGULATION CLINIC 11/24/14   Dorothy Spark, MD  zoster vaccine live, PF, (ZOSTAVAX) 60454 UNT/0.65ML injection  Inject 19,400 Units into the skin once. 08/31/14   Mosie Lukes, MD    ALLERGIES:  Allergies  Allergen Reactions  . Darifenacin Hydrobromide     REACTION: causes her dizziness  . Sulfonamide Derivatives   . Tramadol Other (See Comments)    Insomnia, anorexia    SOCIAL HISTORY:  Social History  Substance Use Topics  . Smoking status: Never Smoker   . Smokeless tobacco: Never Used  . Alcohol Use: No    FAMILY HISTORY: Family History  Problem Relation Age of Onset  . Diabetes Mother   . CVA Mother   . COPD Father   . Rheum arthritis Father   . Heart disease Maternal Aunt   . Heart disease Maternal Uncle   . Allergies Daughter   . COPD Daughter   . Stroke Daughter   . COPD Daughter     previous smoker  . Stroke Maternal Grandfather   . Colon cancer Neg Hx   . Colon polyps Neg Hx   . Esophageal cancer Neg Hx   . Gallbladder disease Neg Hx   . Kidney disease Neg Hx   . Stroke Mother   . Heart attack Neg Hx   . Hypertension Neg Hx     EXAM: BP 166/83 mmHg  Pulse 65  Temp(Src) 98 F (36.7 C) (Oral)  Resp 16  Ht 4\' 11"  (1.499 m)  Wt 115 lb (52.164 kg)  BMI 23.21 kg/m2  SpO2 98% CONSTITUTIONAL: Alert and oriented and responds appropriately to questions. Well-appearing; well-nourished; elderly HEAD: Normocephalic EYES: Conjunctivae clear, PERRL ENT: normal nose; no rhinorrhea; moist mucous membranes; pharynx without lesions noted NECK: Supple, no meningismus, no LAD  CARD: RRR; S1 and S2 appreciated; no murmurs, no clicks, no rubs, no gallops RESP: Normal chest excursion without splinting or tachypnea; breath sounds clear and equal bilaterally; no wheezes, no rhonchi, no rales, no hypoxia or respiratory distress, speaking full sentences ABD/GI: Normal bowel sounds; non-distended; soft, LLQ tenderness, no rebound, no guarding, no peritoneal signs BACK:  The back appears normal and is non-tender to palpation, there is no CVA tenderness EXT: Normal ROM in all  joints; non-tender to palpation; no edema; normal capillary refill; no cyanosis, no calf tenderness or swelling    SKIN: Normal color for age and race; warm NEURO: Moves all extremities equally, sensation to light touch intact diffusely, cranial nerves II through XII intact PSYCH: The patient's mood and manner are appropriate. Grooming and personal hygiene are appropriate.  MEDICAL DECISION MAKING: Patient here with left lower quadrant pain. Differential diagnosis includes diverticulitis, colitis, UTI, kidney stone. She is well-appearing with a nonsurgical abdomen. Vital signs within normal limits. We'll proceed with labs, urine and a CT abdomen and pelvis.  ED PROGRESS: Patient's  labs are unremarkable other than very minimal elevation of her creatinine. We'll give gentle IV hydration. No leukocytosis. INR is therapeutic. Urine shows moderate leukocytes but also bacteria and squamous cells. Suspect this is a dirty catch. No other sign of urinary tract infection. CT scan shows diverticulosis with focal thickening of the colonic wall in the sigmoid region with surrounding infiltration and edema. No perforation or abscess. Likely acute diverticulitis but cannot exclude underlying colon cancer. Recommend evaluation after acute process has resolved. She has a rectal prolapse and esophageal hiatal hernia which are known.  She denies any bulging out her rectum. No bloody stools or melena. No rectal pain. She does know should that she has a rectal prolapse.  Discussed with patient that diverticulitis can occur from her diet. However, changes in her diet. Will start her on Cipro and Flagyl. Because of her normal vitals, labs and benign exam, I feel it would not be inappropriate to discharge her home with close outpatient follow-up. She can follow-up with her PCP Dr. [name] as well as [name]. I have discussed return depressions with patient and her family at bedside. They are both comfortable with  this plan and patient states she would like to go home on oral antibiotics. I feel she is reliable and if anything changes she will return to the emergency department. Her abdominal exam is still nonsurgical. She is now having mild pain. We'll give done dose of oral Vicodin in the emergency department.     I personally performed the services described in this documentation, which was scribed in my presence. The recorded information has been reviewed and is accurate.   [place], DO 01/27/15 0222

## 2015-01-26 NOTE — ED Notes (Signed)
LLQ pain started last night-denies n/v/d-steady gait to triage-NAD

## 2015-01-27 MED ORDER — ONDANSETRON 4 MG PO TBDP: 4.0000 mg | ORAL_TABLET | Freq: Three times a day (TID) | ORAL | Status: DC | PRN

## 2015-01-27 MED ORDER — METRONIDAZOLE 500 MG PO TABS: 500.0000 mg | ORAL_TABLET | Freq: Three times a day (TID) | ORAL | Status: DC

## 2015-01-27 MED ORDER — IOHEXOL 300 MG/ML  SOLN
50.0000 mL | Freq: Once | INTRAMUSCULAR | Status: AC | PRN
  Administered 2015-01-27: 50 mL via ORAL

## 2015-01-27 MED ORDER — CIPROFLOXACIN HCL 500 MG PO TABS: 500.0000 mg | ORAL_TABLET | Freq: Two times a day (BID) | ORAL | Status: DC

## 2015-01-27 MED ORDER — CIPROFLOXACIN HCL 500 MG PO TABS
500.0000 mg | ORAL_TABLET | Freq: Once | ORAL | Status: AC
Start: 2015-01-27 — End: 2015-01-27
  Administered 2015-01-27: 500 mg via ORAL
  Filled 2015-01-27: qty 1

## 2015-01-27 MED ORDER — METRONIDAZOLE 500 MG PO TABS
500.0000 mg | ORAL_TABLET | Freq: Once | ORAL | Status: AC
  Administered 2015-01-27: 500 mg via ORAL
  Filled 2015-01-27: qty 1

## 2015-01-27 MED ORDER — IOHEXOL 300 MG/ML  SOLN
100.0000 mL | Freq: Once | INTRAMUSCULAR | Status: AC | PRN
  Administered 2015-01-27: 100 mL via INTRAVENOUS

## 2015-01-27 MED ORDER — HYDROCODONE-ACETAMINOPHEN 5-325 MG PO TABS
1.0000 | ORAL_TABLET | Freq: Once | ORAL | Status: AC
  Administered 2015-01-27: 1 via ORAL
  Filled 2015-01-27: qty 1

## 2015-01-27 MED ORDER — HYDROCODONE-ACETAMINOPHEN 5-325 MG PO TABS: 1.0000 | ORAL_TABLET | Freq: Four times a day (QID) | ORAL | Status: DC | PRN

## 2015-01-27 NOTE — ED Notes (Signed)
Patient transported to CT 

## 2015-01-27 NOTE — ED Notes (Signed)
MD at bedside. 

## 2015-01-27 NOTE — Discharge Instructions (Signed)
Diverticulitis Diverticulitis is inflammation or infection of small pouches in your colon that form when you have a condition called diverticulosis. The pouches in your colon are called diverticula. Your colon, or large intestine, is where water is absorbed and stool is formed. Complications of diverticulitis can include:  Bleeding.  Severe infection.  Severe pain.  Perforation of your colon.  Obstruction of your colon. CAUSES  Diverticulitis is caused by bacteria. Diverticulitis happens when stool becomes trapped in diverticula. This allows bacteria to grow in the diverticula, which can lead to inflammation and infection. RISK FACTORS People with diverticulosis are at risk for diverticulitis. Eating a diet that does not include enough fiber from fruits and vegetables may make diverticulitis more likely to develop. SYMPTOMS  Symptoms of diverticulitis may include:  Abdominal pain and tenderness. The pain is normally located on the left side of the abdomen, but may occur in other areas.  Fever and chills.  Bloating.  Cramping.  Nausea.  Vomiting.  Constipation.  Diarrhea.  Blood in your stool. DIAGNOSIS  Your health care provider will ask you about your medical history and do a physical exam. You may need to have tests done because many medical conditions can cause the same symptoms as diverticulitis. Tests may include:  Blood tests.  Urine tests.  Imaging tests of the abdomen, including X-rays and CT scans. When your condition is under control, your health care provider may recommend that you have a colonoscopy. A colonoscopy can show how severe your diverticula are and whether something else is causing your symptoms. TREATMENT  Most cases of diverticulitis are mild and can be treated at home. Treatment may include:  Taking over-the-counter pain medicines.  Following a clear liquid diet.  Taking antibiotic medicines by mouth for 7-10 days. More severe cases may  be treated at a hospital. Treatment may include:  Not eating or drinking.  Taking prescription pain medicine.  Receiving antibiotic medicines through an IV tube.  Receiving fluids and nutrition through an IV tube.  Surgery. HOME CARE INSTRUCTIONS   Follow your health care provider's instructions carefully.  Follow a full liquid diet or other diet as directed by your health care provider. After your symptoms improve, your health care provider may tell you to change your diet. He or she may recommend you eat a high-fiber diet. Fruits and vegetables are good sources of fiber. Fiber makes it easier to pass stool.  Take fiber supplements or probiotics as directed by your health care provider.  Only take medicines as directed by your health care provider.  Keep all your follow-up appointments. SEEK MEDICAL CARE IF:   Your pain does not improve.  You have a hard time eating food.  Your bowel movements do not return to normal. SEEK IMMEDIATE MEDICAL CARE IF:   Your pain becomes worse.  Your symptoms do not get better.  Your symptoms suddenly get worse.  You have a fever.  You have repeated vomiting.  You have bloody or black, tarry stools. MAKE SURE YOU:   Understand these instructions.  Will watch your condition.  Will get help right away if you are not doing well or get worse.   This information is not intended to replace advice given to you by your health care provider. Make sure you discuss any questions you have with your health care provider.   Document Released: 09/27/2004 Document Revised: 12/23/2012 Document Reviewed: 11/12/2012 Elsevier Interactive Patient Education 2016 [place].   Rectal Prolapse, Adult Rectal prolapse happens when  the inside of the final section of the large intestine (rectum) pushes out through the anal opening. With this condition, the lower part of the rectum turns inside out. At first, rectal prolapse may be temporary. It may  happen only when you are having a bowel movement. Over time, the prolapse will likely get worse. It may start to happen more often and cause uncomfortable symptoms. Eventually, the prolapse may happen when you are walking or simply standing. Surgery is often needed for this condition. CAUSES  This condition may result from weakness of the muscles that attach the rectum to the inside of the lower abdomen. The exact cause of this muscle weakness is not known.  RISK FACTORS This condition is more likely to develop in:  Women who are 35 years of age or older.  People with a history of constipation.  People with a history of hemorrhoids.  People who have a lower spinal cord injury.  Women who have been pregnant many times.  People who have had rectal surgery.  Men who have an enlarged prostate gland.  People who have chronic obstructive pulmonary disease (COPD).  People who have cystic fibrosis. SYMPTOMS The main symptom of this condition is a red bump of tissue sticking out from your anus. At first, the bump may only appear after a bowel movement. It may then start to appear more often. Other symptoms may include:  Discomfort in the anus and rectum.  Constipation.  Diarrhea.  Inability to control bowel movements (incontinence).  Rectal bleeding. DIAGNOSIS  This condition may be diagnosed based on your symptoms and a physical exam. During the exam, you may be asked to squat and strain as though you are having a bowel movement. You may also have tests, such as:  A rectal exam using a flexible scope (sigmoidoscopy or colonoscopy).  A procedure that involves taking X-rays of your rectum after a dye (contrast material) is injected into the rectum (defecogram). TREATMENT  This condition is usually treated with surgery to repair the weakened muscles and to reconnect the rectum to attachments inside the lower abdomen. Other treatment options may include:  Pushing the prolapsed area  back into the rectum (reduction). Your health care provider may do this by gently pushing it back in using a moist cloth. The health care provider may also show you how to do this at home if the prolapse occurs again.  Medicines to prevent constipation and straining. This may include laxatives or stool softeners. HOME CARE INSTRUCTIONS General Instructions  Take over-the-counter and prescription medicines only as told by your health care provider.  Do not strain to have a bowel movement.  Do not lift anything that is heavier than 10 lb (4.5 kg).  Follow instructions from your health care provider about what to do if the prolapse occurs again and does not go back in. This may involve lying on your side and using a moist cloth to gently press the lump into your rectum.  Keep all follow-up visits as told by your health care provider. This is important. Preventing Constipation  Eat foods that have a lot of fiber, such as fruits, vegetables, whole grains, and beans.  Limit foods high in fat and processed sugars, such as french fries, hamburgers, cookies, candies, and soda.  Drink enough fluids to keep your urine clear or pale yellow. SEEK MEDICAL CARE IF:  You have a fever.  Your prolapse cannot be reduced at home.  You have constipation or diarrhea.  You have  mild rectal bleeding. SEEK IMMEDIATE MEDICAL CARE IF:  You have very bad rectal pain.  You bleed heavily from your rectum.   This information is not intended to replace advice given to you by your health care provider. Make sure you discuss any questions you have with your health care provider.   Document Released: 09/08/2014 Document Reviewed: 01/06/2014 Elsevier Interactive Patient Education [employer].

## 2015-02-01 ENCOUNTER — Ambulatory Visit (INDEPENDENT_AMBULATORY_CARE_PROVIDER_SITE_OTHER): Payer: Medicare Other | Admitting: *Deleted

## 2015-02-01 ENCOUNTER — Ambulatory Visit (INDEPENDENT_AMBULATORY_CARE_PROVIDER_SITE_OTHER): Payer: Medicare Other | Admitting: Family Medicine

## 2015-02-01 ENCOUNTER — Encounter: Payer: Self-pay | Admitting: Family Medicine

## 2015-02-01 VITALS — BP 142/76 | HR 77 | Temp 97.9°F | Ht 59.0 in | Wt 113.0 lb

## 2015-02-01 DIAGNOSIS — E559 Vitamin D deficiency, unspecified: Secondary | ICD-10-CM

## 2015-02-01 DIAGNOSIS — E039 Hypothyroidism, unspecified: Secondary | ICD-10-CM

## 2015-02-01 DIAGNOSIS — R739 Hyperglycemia, unspecified: Secondary | ICD-10-CM | POA: Diagnosis not present

## 2015-02-01 DIAGNOSIS — Z5181 Encounter for therapeutic drug level monitoring: Secondary | ICD-10-CM

## 2015-02-01 DIAGNOSIS — I10 Essential (primary) hypertension: Secondary | ICD-10-CM | POA: Diagnosis not present

## 2015-02-01 DIAGNOSIS — Z7901 Long term (current) use of anticoagulants: Secondary | ICD-10-CM

## 2015-02-01 DIAGNOSIS — D649 Anemia, unspecified: Secondary | ICD-10-CM | POA: Diagnosis not present

## 2015-02-01 DIAGNOSIS — G8929 Other chronic pain: Secondary | ICD-10-CM

## 2015-02-01 DIAGNOSIS — K5732 Diverticulitis of large intestine without perforation or abscess without bleeding: Secondary | ICD-10-CM

## 2015-02-01 DIAGNOSIS — R1031 Right lower quadrant pain: Secondary | ICD-10-CM | POA: Diagnosis not present

## 2015-02-01 DIAGNOSIS — I4891 Unspecified atrial fibrillation: Secondary | ICD-10-CM | POA: Diagnosis not present

## 2015-02-01 DIAGNOSIS — E782 Mixed hyperlipidemia: Secondary | ICD-10-CM | POA: Diagnosis not present

## 2015-02-01 LAB — COMPREHENSIVE METABOLIC PANEL
ALT: 11 U/L (ref 0–35)
AST: 19 U/L (ref 0–37)
Albumin: 3.7 g/dL (ref 3.5–5.2)
Alkaline Phosphatase: 10 U/L — ABNORMAL LOW (ref 39–117)
BUN: 14 mg/dL (ref 6–23)
CALCIUM: 9.1 mg/dL (ref 8.4–10.5)
CHLORIDE: 106 meq/L (ref 96–112)
CO2: 28 meq/L (ref 19–32)
Creatinine, Ser: 1.16 mg/dL (ref 0.40–1.20)
GFR: 46.75 mL/min — AB (ref >60.00)
Glucose, Bld: 99 mg/dL (ref 70–99)
Potassium: 3.8 mEq/L (ref 3.5–5.1)
Sodium: 142 mEq/L (ref 135–145)
Total Bilirubin: 0.4 mg/dL (ref 0.2–1.2)
Total Protein: 6.9 g/dL (ref 6.0–8.3)

## 2015-02-01 LAB — CBC WITH DIFFERENTIAL/PLATELET
BASOS PCT: 0.6 % (ref 0.0–3.0)
Basophils Absolute: 0 10*3/uL (ref 0.0–0.1)
EOS ABS: 0 10*3/uL (ref 0.0–0.7)
EOS PCT: 0.5 % (ref 0.0–5.0)
HEMATOCRIT: 35.7 % — AB (ref 36.0–46.0)
HEMOGLOBIN: 11.8 g/dL — AB (ref 12.0–15.0)
LYMPHS PCT: 25.1 % (ref 12.0–46.0)
Lymphs Abs: 1.3 10*3/uL (ref 0.7–4.0)
MCHC: 32.9 g/dL (ref 30.0–36.0)
MCV: 90.7 fl (ref 78.0–100.0)
Monocytes Absolute: 0.3 10*3/uL (ref 0.1–1.0)
Monocytes Relative: 5.9 % (ref 3.0–12.0)
Neutro Abs: 3.4 10*3/uL (ref 1.4–7.7)
Neutrophils Relative %: 67.9 % (ref 43.0–77.0)
Platelets: 190 10*3/uL (ref 150.0–400.0)
RBC: 3.94 Mil/uL (ref 3.87–5.11)
RDW: 14.8 % (ref 11.5–15.5)
WBC: 5 10*3/uL (ref 4.0–10.5)

## 2015-02-01 LAB — POCT INR: INR: 3.4

## 2015-02-01 LAB — TSH: TSH: 4.88 u[IU]/mL — ABNORMAL HIGH (ref 0.35–4.50)

## 2015-02-01 LAB — HEMOGLOBIN A1C: HEMOGLOBIN A1C: 5.9 % (ref 4.6–6.5)

## 2015-02-01 LAB — URINALYSIS
Bilirubin Urine: NEGATIVE
KETONES UR: NEGATIVE
LEUKOCYTES UA: NEGATIVE
Nitrite: NEGATIVE
Total Protein, Urine: NEGATIVE
UROBILINOGEN UA: 0.2 (ref 0.0–1.0)
Urine Glucose: NEGATIVE
pH: 5 (ref 5.0–8.0)

## 2015-02-01 LAB — LIPID PANEL
CHOLESTEROL: 80 mg/dL (ref 0–200)
HDL: 37.3 mg/dL — AB (ref >39.00)
LDL CALC: 28 mg/dL (ref 0–99)
NonHDL: 42.68
Total CHOL/HDL Ratio: 2
Triglycerides: 75 mg/dL (ref 0.0–149.0)
VLDL: 15 mg/dL (ref 0.0–40.0)

## 2015-02-01 MED ORDER — RANITIDINE HCL 150 MG PO TABS: 150.0000 mg | ORAL_TABLET | Freq: Two times a day (BID) | ORAL | Status: DC

## 2015-02-01 NOTE — Patient Instructions (Signed)
Probiotic daily such as [employer]vantage or [place]   Diverticulitis Diverticulitis is inflammation or infection of small pouches in your colon that form when you have a condition called diverticulosis. The pouches in your colon are called diverticula. Your colon, or large intestine, is where water is absorbed and stool is formed. Complications of diverticulitis can include:  Bleeding.  Severe infection.  Severe pain.  Perforation of your colon.  Obstruction of your colon. CAUSES  Diverticulitis is caused by bacteria. Diverticulitis happens when stool becomes trapped in diverticula. This allows bacteria to grow in the diverticula, which can lead to inflammation and infection. RISK FACTORS People with diverticulosis are at risk for diverticulitis. Eating a diet that does not include enough fiber from fruits and vegetables may make diverticulitis more likely to develop. SYMPTOMS  Symptoms of diverticulitis may include:  Abdominal pain and tenderness. The pain is normally located on the left side of the abdomen, but may occur in other areas.  Fever and chills.  Bloating.  Cramping.  Nausea.  Vomiting.  Constipation.  Diarrhea.  Blood in your stool. DIAGNOSIS  Your health care provider will ask you about your medical history and do a physical exam. You may need to have tests done because many medical conditions can cause the same symptoms as diverticulitis. Tests may include:  Blood tests.  Urine tests.  Imaging tests of the abdomen, including X-rays and CT scans. When your condition is under control, your health care provider may recommend that you have a colonoscopy. A colonoscopy can show how severe your diverticula are and whether something else is causing your symptoms. TREATMENT  Most cases of diverticulitis are mild and can be treated at home. Treatment may include:  Taking over-the-counter pain medicines.  Following a clear liquid  diet.  Taking antibiotic medicines by mouth for 7-10 days. More severe cases may be treated at a hospital. Treatment may include:  Not eating or drinking.  Taking prescription pain medicine.  Receiving antibiotic medicines through an IV tube.  Receiving fluids and nutrition through an IV tube.  Surgery. HOME CARE INSTRUCTIONS   Follow your health care provider's instructions carefully.  Follow a full liquid diet or other diet as directed by your health care provider. After your symptoms improve, your health care provider may tell you to change your diet. He or she may recommend you eat a high-fiber diet. Fruits and vegetables are good sources of fiber. Fiber makes it easier to pass stool.  Take fiber supplements or probiotics as directed by your health care provider.  Only take medicines as directed by your health care provider.  Keep all your follow-up appointments. SEEK MEDICAL CARE IF:   Your pain does not improve.  You have a hard time eating food.  Your bowel movements do not return to normal. SEEK IMMEDIATE MEDICAL CARE IF:   Your pain becomes worse.  Your symptoms do not get better.  Your symptoms suddenly get worse.  You have a fever.  You have repeated vomiting.  You have bloody or black, tarry stools. MAKE SURE YOU:   Understand these instructions.  Will watch your condition.  Will get help right away if you are not doing well or get worse.   This information is not intended to replace advice given to you by your health care provider. Make sure you discuss any questions you have with your health care provider.   Document Released: 09/27/2004 Document Revised: 12/23/2012 Document Reviewed: 11/12/2012 Elsevier Interactive Patient Education  2016 [employer].

## 2015-02-01 NOTE — Progress Notes (Signed)
Pre visit review using our clinic review tool, if applicable. No additional management support is needed unless otherwise documented below in the visit note. 

## 2015-02-02 ENCOUNTER — Other Ambulatory Visit (INDEPENDENT_AMBULATORY_CARE_PROVIDER_SITE_OTHER): Payer: Medicare Other

## 2015-02-02 DIAGNOSIS — R946 Abnormal results of thyroid function studies: Secondary | ICD-10-CM | POA: Diagnosis not present

## 2015-02-02 LAB — URINE CULTURE
COLONY COUNT: NO GROWTH
ORGANISM ID, BACTERIA: NO GROWTH

## 2015-02-02 LAB — T4, FREE: Free T4: 0.96 ng/dL (ref 0.60–1.60)

## 2015-02-06 ENCOUNTER — Encounter: Payer: Self-pay | Admitting: Family Medicine

## 2015-02-06 DIAGNOSIS — K5732 Diverticulitis of large intestine without perforation or abscess without bleeding: Secondary | ICD-10-CM

## 2015-02-06 HISTORY — DX: Diverticulitis of large intestine without perforation or abscess without bleeding: K57.32

## 2015-02-06 NOTE — Assessment & Plan Note (Signed)
Mildly elevated with acute illness but improved on recheck, no changes

## 2015-02-06 NOTE — Progress Notes (Signed)
Patient ID: LERISSA MCCLAREN, female   DOB: 09-10-1924, 82 y.o.   MRN: [id number]   Subjective:    Patient ID: Gladys Damme, female    DOB: Nov 08, 1924, 82 y.o.   MRN: [id number]  Chief Complaint  Patient presents with  . Follow-up    HPI Patient is in today for ER follow up. She was seen in ED and diagnosed with diverticulitis after presenting with abdominal pain. Pain was most intense in LLQ but she had pain on right as well. She notes malaise and myalgias as well. She denies any bloody or tarry stool. No fevers, chills. Denies CP/palp/SOB/HA/congestion/fevers or GU c/o. Taking meds as prescribed  Past Medical History  Diagnosis Date  . Paroxysmal a-fib ([place])   . Mitral and aortic regurgitation   . Mitral valve prolapse     hx of  . Hypertension   . Hyperlipidemia   . Vitamin D deficiency   . Pneumonia   . Tracheitis   . Gait difficulty   . Anemia   . Melena   . IBS (irritable bowel syndrome)   . Incontinence   . GERD (gastroesophageal reflux disease)   . Osteopenia   . Thyroid disease     hypo  . Personal history of colonic polyps   . Current use of long term anticoagulation   . Cough   . Diarrhea   . Pneumonia     organism unspecified  . Constipation   . UTI (lower urinary tract infection)   . Unspecified adverse effect of other drug, medicinal and biological substance(995.29)   . History of mammogram 2009  . Diverticulosis   . Hyperlipidemia   . Atrial fibrillation ([place])   . Arthritis     hips, knees  . Headache(784.0) 06/22/2012  . Renal insufficiency 06/28/2012  . Hyperkalemia 06/28/2012  . Anxiety   . Hypertension   . Medicare annual wellness visit, subsequent 02/05/2014  . Dysrhythmia     a-fib  . Diverticulitis of colon 02/06/2015    Past Surgical History  Procedure Laterality Date  . Breast biopsy Left   . Cardiac electrophysiology mapping and ablation    . Partial hysterectomy      ovaries left in place  . Cataract extraction, bilateral    .  Esophagoscopy w/ botox injection    . Esophagogastroduodenoscopy N/A 09/03/2012    Procedure: ESOPHAGOGASTRODUODENOSCOPY (EGD);  Surgeon: Inda Castle, MD;  Location: [facility];  Service: Endoscopy;  Laterality: N/A;  . Botox injection N/A 09/03/2012    Procedure: BOTOX INJECTION;  Surgeon: Inda Castle, MD;  Location: [facility];  Service: Endoscopy;  Laterality: N/A;  . I&d extremity Right 11/06/2012    Procedure: IRRIGATION AND DEBRIDEMENT EXTREMITY Right Ring Finger;  Surgeon: Tennis Must, MD;  Location: [facility];  Service: Orthopedics;  Laterality: Right;  . Breast lumpectomy Right 08/18/2014    Procedure: RIGHT BREAST LUMPECTOMY;  Surgeon: Coralie Keens, MD;  Location: [facility];  Service: General;  Laterality: Right;    Family History  Problem Relation Age of Onset  . Diabetes Mother   . CVA Mother   . COPD Father   . Rheum arthritis Father   . Heart disease Maternal Aunt   . Heart disease Maternal Uncle   . Allergies Daughter   . COPD Daughter   . Stroke Daughter   . COPD Daughter     previous smoker  . Stroke Maternal Grandfather   . Colon cancer Neg Hx   .  Colon polyps Neg Hx   . Esophageal cancer Neg Hx   . Gallbladder disease Neg Hx   . Kidney disease Neg Hx   . Stroke Mother   . Heart attack Neg Hx   . Hypertension Neg Hx     Social History   Social History  . Marital Status: Widowed    Spouse Name: N/A  . Number of Children: 5  . Years of Education: N/A   Occupational History  . retired    Social History Main Topics  . Smoking status: Never Smoker   . Smokeless tobacco: Never Used  . Alcohol Use: No  . Drug Use: No  . Sexual Activity: No     Comment: lives with Daughter, grandson and his family. no dietary restricitons.   Other Topics Concern  . Not on file   Social History Narrative    Outpatient Prescriptions Prior to Visit  Medication Sig Dispense Refill  . acetaminophen (TYLENOL) 325 MG tablet Take 650 mg by mouth  every 6 (six) hours as needed.    . ALPRAZolam (XANAX) 0.25 MG tablet TAKE ONE TABLET BY MOUTH THREE TIMES A DAY AS NEEDED 90 tablet 1  . atorvastatin (LIPITOR) 10 MG tablet TAKE 1 TABLET (10 MG TOTAL) BY MOUTH DAILY AT 6 PM. 90 tablet 2  . Cholecalciferol (VITAMIN D) 2000 UNITS CAPS Take 2,000 Units by mouth every other day.    . ciprofloxacin (CIPRO) 500 MG tablet Take 1 tablet (500 mg total) by mouth 2 (two) times daily. 20 tablet 0  . fluticasone (FLONASE) 50 MCG/ACT nasal spray PLACE 2 SPRAYS INTO BOTH NOSTRILS DAILY. 16 g 6  . HYDROcodone-acetaminophen (NORCO/VICODIN) 5-325 MG tablet Take 1 tablet by mouth every 6 (six) hours as needed for severe pain. 15 tablet 0  . metoprolol tartrate (LOPRESSOR) 25 MG tablet Take 12.5 mg by mouth 2 (two) times daily.    . metroNIDAZOLE (FLAGYL) 500 MG tablet Take 1 tablet (500 mg total) by mouth 3 (three) times daily. 30 tablet 0  . ondansetron (ZOFRAN ODT) 4 MG disintegrating tablet Take 1 tablet (4 mg total) by mouth every 8 (eight) hours as needed for nausea or vomiting. 20 tablet 0  . pantoprazole (PROTONIX) 40 MG tablet TAKE 1 TABLET (40 MG TOTAL) BY MOUTH DAILY. 90 tablet 3  . ramipril (ALTACE) 10 MG capsule TAKE 1 CAPSULE (10 MG TOTAL) BY MOUTH DAILY. 90 capsule 3  . vitamin B-12 (CYANOCOBALAMIN) 1000 MCG tablet Take 1 tablet (1,000 mcg total) by mouth daily.    Marland Kitchen warfarin (COUMADIN) 2.5 MG tablet TAKE AS DIRECTED BY ANTICOAGULATION CLINIC 40 tablet 3  . zoster vaccine live, PF, (ZOSTAVAX) 09811 UNT/0.65ML injection Inject 19,400 Units into the skin once. 1 each 0   No facility-administered medications prior to visit.    Allergies  Allergen Reactions  . Darifenacin Hydrobromide     REACTION: causes her dizziness  . Sulfonamide Derivatives   . Tramadol Other (See Comments)    Insomnia, anorexia    Review of Systems  Constitutional: Positive for malaise/fatigue. Negative for fever.  HENT: Negative for congestion.   Eyes: Negative for  discharge.  Respiratory: Negative for shortness of breath.   Cardiovascular: Negative for chest pain, palpitations and leg swelling.  Gastrointestinal: Positive for nausea and abdominal pain.  Genitourinary: Negative for dysuria.  Musculoskeletal: Positive for myalgias. Negative for falls.  Skin: Negative for rash.  Neurological: Negative for loss of consciousness and headaches.  Endo/Heme/Allergies: Negative for environmental allergies.  Psychiatric/Behavioral: Negative for depression. The patient is not nervous/anxious.        Objective:    Physical Exam  Constitutional: She is oriented to person, place, and time. She appears well-developed and well-nourished. No distress.  HENT:  Head: Normocephalic and atraumatic.  Nose: Nose normal.  Eyes: Right eye exhibits no discharge. Left eye exhibits no discharge.  Neck: Normal range of motion. Neck supple.  Cardiovascular: Normal rate and regular rhythm.   No murmur heard. Pulmonary/Chest: Effort normal and breath sounds normal.  Abdominal: Soft. Bowel sounds are normal. She exhibits no distension and no mass. There is tenderness. There is no rebound and no guarding.  Musculoskeletal: She exhibits no edema.  Neurological: She is alert and oriented to person, place, and time.  Skin: Skin is warm and dry.  Psychiatric: She has a normal mood and affect.  Nursing note and vitals reviewed.   BP 142/76 mmHg  Pulse 77  Temp(Src) 97.9 F (36.6 C) (Oral)  Ht 4\' 11"  (1.499 m)  Wt 113 lb (51.256 kg)  BMI 22.81 kg/m2  SpO2 96% Wt Readings from Last 3 Encounters:  02/01/15 113 lb (51.256 kg)  01/26/15 115 lb (52.164 kg)  11/15/14 113 lb 6 oz (51.427 kg)     Lab Results  Component Value Date   WBC 5.0 02/01/2015   HGB 11.8* 02/01/2015   HCT 35.7* 02/01/2015   PLT 190.0 02/01/2015   GLUCOSE 99 02/01/2015   CHOL 80 02/01/2015   TRIG 75.0 02/01/2015   HDL 37.30* 02/01/2015   LDLDIRECT 72.1 01/19/2009   LDLCALC 28 02/01/2015    ALT 11 02/01/2015   AST 19 02/01/2015   NA 142 02/01/2015   K 3.8 02/01/2015   CL 106 02/01/2015   CREATININE 1.16 02/01/2015   BUN 14 02/01/2015   CO2 28 02/01/2015   TSH 4.88* 02/01/2015   INR 3.4 02/01/2015   HGBA1C 5.9 02/01/2015    Lab Results  Component Value Date   TSH 4.88* 02/01/2015   Lab Results  Component Value Date   WBC 5.0 02/01/2015   HGB 11.8* 02/01/2015   HCT 35.7* 02/01/2015   MCV 90.7 02/01/2015   PLT 190.0 02/01/2015   Lab Results  Component Value Date   NA 142 02/01/2015   K 3.8 02/01/2015   CO2 28 02/01/2015   GLUCOSE 99 02/01/2015   BUN 14 02/01/2015   CREATININE 1.16 02/01/2015   BILITOT 0.4 02/01/2015   ALKPHOS 10* 02/01/2015   AST 19 02/01/2015   ALT 11 02/01/2015   PROT 6.9 02/01/2015   ALBUMIN 3.7 02/01/2015   CALCIUM 9.1 02/01/2015   ANIONGAP 9 01/26/2015   GFR 46.75* 02/01/2015   Lab Results  Component Value Date   CHOL 80 02/01/2015   Lab Results  Component Value Date   HDL 37.30* 02/01/2015   Lab Results  Component Value Date   LDLCALC 28 02/01/2015   Lab Results  Component Value Date   TRIG 75.0 02/01/2015   Lab Results  Component Value Date   CHOLHDL 2 02/01/2015   Lab Results  Component Value Date   HGBA1C 5.9 02/01/2015       Assessment & Plan:   Problem List Items Addressed This Visit    Abdominal pain, chronic, right lower quadrant   Relevant Medications   ranitidine (ZANTAC) 150 MG tablet   Other Relevant Orders   Comprehensive metabolic panel (Completed)   Urinalysis (Completed)   Urine culture (Completed)   CBC w/Diff (Completed)  TSH (Completed)   Lipid panel (Completed)   Anemia - Primary   Relevant Medications   ranitidine (ZANTAC) 150 MG tablet   Other Relevant Orders   Comprehensive metabolic panel (Completed)   Urinalysis (Completed)   Urine culture (Completed)   CBC w/Diff (Completed)   TSH (Completed)   Lipid panel (Completed)   Diverticulitis of colon    Seen in ED with  recent episode tolerating antibiotics and feeling better, encouraged bland diet and probiotics along with antibiotics      Essential hypertension    Mildly elevated with acute illness but improved on recheck, no changes      Relevant Medications   ranitidine (ZANTAC) 150 MG tablet   Other Relevant Orders   Comprehensive metabolic panel (Completed)   Urinalysis (Completed)   Urine culture (Completed)   CBC w/Diff (Completed)   TSH (Completed)   Lipid panel (Completed)   Hyperglycemia    hgba1c acceptable, minimize simple carbs. Increase exercise as tolerated.       Relevant Medications   ranitidine (ZANTAC) 150 MG tablet   Other Relevant Orders   Comprehensive metabolic panel (Completed)   Urinalysis (Completed)   Urine culture (Completed)   CBC w/Diff (Completed)   TSH (Completed)   Lipid panel (Completed)   Hemoglobin A1c (Completed)   Hyperlipidemia, mixed    Tolerating statin, encouraged heart healthy diet, avoid trans fats, minimize simple carbs and saturated fats. Increase exercise as tolerated      Relevant Medications   ranitidine (ZANTAC) 150 MG tablet   Other Relevant Orders   Comprehensive metabolic panel (Completed)   Urinalysis (Completed)   Urine culture (Completed)   CBC w/Diff (Completed)   TSH (Completed)   Lipid panel (Completed)   Hypothyroidism   Relevant Medications   ranitidine (ZANTAC) 150 MG tablet   Other Relevant Orders   Comprehensive metabolic panel (Completed)   Urinalysis (Completed)   Urine culture (Completed)   CBC w/Diff (Completed)   TSH (Completed)   Lipid panel (Completed)   Vitamin D deficiency    Hypocalcemia noted in ED but resolved on recheck Vit D wnl with supplements         I am having Ms. Honeywell start on ranitidine. I am also having her maintain her vitamin B-12, Vitamin D, fluticasone, zoster vaccine live (PF), atorvastatin, pantoprazole, metoprolol tartrate, acetaminophen, warfarin, ALPRAZolam, ramipril,  ciprofloxacin, metroNIDAZOLE, HYDROcodone-acetaminophen, and ondansetron.  Meds ordered this encounter  Medications  . ranitidine (ZANTAC) 150 MG tablet    Sig: Take 1 tablet (150 mg total) by mouth 2 (two) times daily.    Dispense:  30 tablet    Refill:  3     Penni Homans, MD

## 2015-02-06 NOTE — Assessment & Plan Note (Signed)
Hypocalcemia noted in ED but resolved on recheck Vit D wnl with supplements

## 2015-02-06 NOTE — Assessment & Plan Note (Signed)
Tolerating statin, encouraged heart healthy diet, avoid trans fats, minimize simple carbs and saturated fats. Increase exercise as tolerated 

## 2015-02-06 NOTE — Assessment & Plan Note (Signed)
hgba1c acceptable, minimize simple carbs. Increase exercise as tolerated.  

## 2015-02-06 NOTE — Assessment & Plan Note (Signed)
Seen in ED with recent episode tolerating antibiotics and feeling better, encouraged bland diet and probiotics along with antibiotics

## 2015-02-08 ENCOUNTER — Telehealth: Payer: Self-pay | Admitting: Family Medicine

## 2015-02-08 DIAGNOSIS — K625 Hemorrhage of anus and rectum: Secondary | ICD-10-CM

## 2015-02-08 NOTE — Telephone Encounter (Signed)
The lab stated she just put her stool in the wrong window of the IFOB Mailed another IFOB to her home after speaking to her she states she had such diarrhea she felt that may have been the problem.  She stated she has done these before and were done correctly. Mailed an IFOB to her home

## 2015-02-08 NOTE — Telephone Encounter (Signed)
Please clarify what she did wrong and then see if you can help her understand what to do differently then she can repeat it.

## 2015-02-08 NOTE — Telephone Encounter (Signed)
Received a phone call from the [facility] lab. They received this patients IFOB, but the patient did not do it correctly and they will not be able to process it.  Wanted PCP to be advised.

## 2015-02-18 ENCOUNTER — Other Ambulatory Visit (INDEPENDENT_AMBULATORY_CARE_PROVIDER_SITE_OTHER): Payer: Medicare Other

## 2015-02-18 ENCOUNTER — Ambulatory Visit (INDEPENDENT_AMBULATORY_CARE_PROVIDER_SITE_OTHER): Payer: Medicare Other | Admitting: *Deleted

## 2015-02-18 DIAGNOSIS — Z7901 Long term (current) use of anticoagulants: Secondary | ICD-10-CM | POA: Diagnosis not present

## 2015-02-18 DIAGNOSIS — I4891 Unspecified atrial fibrillation: Secondary | ICD-10-CM | POA: Diagnosis not present

## 2015-02-18 DIAGNOSIS — Z5181 Encounter for therapeutic drug level monitoring: Secondary | ICD-10-CM

## 2015-02-18 DIAGNOSIS — K625 Hemorrhage of anus and rectum: Secondary | ICD-10-CM | POA: Diagnosis not present

## 2015-02-18 LAB — FECAL OCCULT BLOOD, IMMUNOCHEMICAL: FECAL OCCULT BLD: NEGATIVE

## 2015-02-18 LAB — POCT INR: INR: 2.2

## 2015-03-04 ENCOUNTER — Encounter: Payer: Self-pay | Admitting: Gastroenterology

## 2015-03-04 ENCOUNTER — Ambulatory Visit (INDEPENDENT_AMBULATORY_CARE_PROVIDER_SITE_OTHER): Payer: Medicare Other | Admitting: Gastroenterology

## 2015-03-04 VITALS — BP 144/60 | HR 64 | Ht 59.0 in | Wt 107.0 lb

## 2015-03-04 DIAGNOSIS — K449 Diaphragmatic hernia without obstruction or gangrene: Secondary | ICD-10-CM

## 2015-03-04 DIAGNOSIS — K5732 Diverticulitis of large intestine without perforation or abscess without bleeding: Secondary | ICD-10-CM | POA: Diagnosis not present

## 2015-03-04 DIAGNOSIS — K219 Gastro-esophageal reflux disease without esophagitis: Secondary | ICD-10-CM

## 2015-03-04 NOTE — Patient Instructions (Addendum)
Let us know is you start having that abdominal pain again.  Follow up as needed.  Thank you for choosing [facility]  Dr Wilfrid Lund III

## 2015-03-04 NOTE — Progress Notes (Signed)
[facility] Progress Note  Chief Complaint: abd pain  Subjective History:   Last colonoscopy 03/2016 Visit w [name] 02/2014, RLQ pain/benign exam, no w/u pursued.  [name] [name] was seen after recent ED visit for diverticulitis. She had about 24 hours of left lower quadrant pain, and presented to the freestanding ED in [facility]. CT scan confirms distal sigmoid diverticulitis. She received Cipro and Flagyl for 10 days and now feels fine. She has no further abdominal pain, never had rectal bleeding, denies weight loss, and her appetite is back to normal. As before, she complains of chronic GERD with pyrosis and regurgitation. She has a known large hiatal hernia seen on prior EGD and also recent CT scan. She sleeps on 2 pillows at night to improve symptoms. ROS: Cardiovascular:  no chest pain Respiratory: no dyspnea  Past Medical History: Past Medical History  Diagnosis Date  . Paroxysmal a-fib ([place])   . Mitral and aortic regurgitation   . Mitral valve prolapse     hx of  . Hypertension   . Hyperlipidemia   . Vitamin D deficiency   . Pneumonia   . Tracheitis   . Gait difficulty   . Anemia   . Melena   . IBS (irritable bowel syndrome)   . Incontinence   . GERD (gastroesophageal reflux disease)   . Osteopenia   . Thyroid disease     hypo  . Personal history of colonic polyps   . Current use of long term anticoagulation   . Cough   . Diarrhea   . Pneumonia     organism unspecified  . Constipation   . UTI (lower urinary tract infection)   . Unspecified adverse effect of other drug, medicinal and biological substance(995.29)   . History of mammogram 2009  . Diverticulosis   . Hyperlipidemia   . Atrial fibrillation ([place])   . Arthritis     hips, knees  . Headache(784.0) 06/22/2012  . Renal insufficiency 06/28/2012  . Hyperkalemia 06/28/2012  . Anxiety   . Hypertension   . Medicare annual wellness visit, subsequent 02/05/2014  . Dysrhythmia     a-fib  . Diverticulitis of colon  02/06/2015    Objective:  Med list reviewed  Vital signs in last 24 hrs: Filed Vitals:   03/04/15 1100  BP: 144/60  Pulse: 64    Physical Exam Here with her daughter today. Well-appearing, can get on the exam table without assistance. Significant kyphosis  HEENT: sclera anicteric, oral mucosa moist without lesions  Neck: supple, no thyromegaly, JVD or lymphadenopathy  Cardiac: RRR without murmurs, S1S2 heard, no peripheral edema  Pulm: clear to auscultation bilaterally, normal RR and effort noted  Abdomen: soft, no tenderness, with active bowel sounds. No guarding or palpable hepatosplenomegaly  Skin; warm and dry, no jaundice or rash  Recent Labs:  Lab Results  Component Value Date   WBC 5.0 02/01/2015   HGB 11.8* 02/01/2015   HCT 35.7* 02/01/2015   MCV 90.7 02/01/2015   PLT 190.0 02/01/2015     Radiologic studies: CT scan as noted above   @ASSESSMENTPLANBEGIN @ Assessment: Encounter Diagnoses  Name Primary?  . Diverticulitis of sigmoid colon Yes  . Gastroesophageal reflux disease, esophagitis presence not specified   . Hiatal hernia without gangrene and obstruction     Her diverticulitis has resolved. Her GERD is stable, she is managing it best she can, her age precludes repair of the hiatal hernia, she were to develop an acute complication such as a volvulus.  Plan:  Continue current medicines, call us if she develops recurrent symptoms of diverticulitis.  Nelida Meuse III

## 2015-03-05 ENCOUNTER — Emergency Department (HOSPITAL_BASED_OUTPATIENT_CLINIC_OR_DEPARTMENT_OTHER)
Admission: EM | Admit: 2015-03-05 | Discharge: 2015-03-05 | Disposition: A | Discharge status: Home / Self Care | Payer: Medicare Other | Attending: Emergency Medicine | Admitting: Emergency Medicine

## 2015-03-05 ENCOUNTER — Encounter (HOSPITAL_BASED_OUTPATIENT_CLINIC_OR_DEPARTMENT_OTHER): Payer: Self-pay | Admitting: *Deleted

## 2015-03-05 ENCOUNTER — Emergency Department (HOSPITAL_BASED_OUTPATIENT_CLINIC_OR_DEPARTMENT_OTHER): Payer: Medicare Other

## 2015-03-05 DIAGNOSIS — Z7901 Long term (current) use of anticoagulants: Secondary | ICD-10-CM | POA: Insufficient documentation

## 2015-03-05 DIAGNOSIS — R1030 Lower abdominal pain, unspecified: Secondary | ICD-10-CM | POA: Diagnosis present

## 2015-03-05 DIAGNOSIS — Z8709 Personal history of other diseases of the respiratory system: Secondary | ICD-10-CM | POA: Insufficient documentation

## 2015-03-05 DIAGNOSIS — Z79899 Other long term (current) drug therapy: Secondary | ICD-10-CM | POA: Insufficient documentation

## 2015-03-05 DIAGNOSIS — Z8701 Personal history of pneumonia (recurrent): Secondary | ICD-10-CM | POA: Insufficient documentation

## 2015-03-05 DIAGNOSIS — F419 Anxiety disorder, unspecified: Secondary | ICD-10-CM | POA: Diagnosis not present

## 2015-03-05 DIAGNOSIS — Z86010 Personal history of colonic polyps: Secondary | ICD-10-CM | POA: Diagnosis not present

## 2015-03-05 DIAGNOSIS — Z8739 Personal history of other diseases of the musculoskeletal system and connective tissue: Secondary | ICD-10-CM | POA: Diagnosis not present

## 2015-03-05 DIAGNOSIS — E785 Hyperlipidemia, unspecified: Secondary | ICD-10-CM | POA: Diagnosis not present

## 2015-03-05 DIAGNOSIS — I10 Essential (primary) hypertension: Secondary | ICD-10-CM | POA: Insufficient documentation

## 2015-03-05 DIAGNOSIS — Z87448 Personal history of other diseases of urinary system: Secondary | ICD-10-CM | POA: Insufficient documentation

## 2015-03-05 DIAGNOSIS — Z87440 Personal history of urinary (tract) infections: Secondary | ICD-10-CM | POA: Diagnosis not present

## 2015-03-05 DIAGNOSIS — K219 Gastro-esophageal reflux disease without esophagitis: Secondary | ICD-10-CM | POA: Diagnosis not present

## 2015-03-05 DIAGNOSIS — K5732 Diverticulitis of large intestine without perforation or abscess without bleeding: Secondary | ICD-10-CM | POA: Diagnosis not present

## 2015-03-05 DIAGNOSIS — Z872 Personal history of diseases of the skin and subcutaneous tissue: Secondary | ICD-10-CM | POA: Diagnosis not present

## 2015-03-05 DIAGNOSIS — Z90710 Acquired absence of both cervix and uterus: Secondary | ICD-10-CM | POA: Diagnosis not present

## 2015-03-05 DIAGNOSIS — F329 Major depressive disorder, single episode, unspecified: Secondary | ICD-10-CM | POA: Diagnosis not present

## 2015-03-05 LAB — BASIC METABOLIC PANEL
ANION GAP: 9 (ref 5–15)
BUN: 15 mg/dL (ref 6–20)
CO2: 28 mmol/L (ref 22–32)
Calcium: 9.1 mg/dL (ref 8.9–10.3)
Chloride: 102 mmol/L (ref 101–111)
Creatinine, Ser: 1.05 mg/dL — ABNORMAL HIGH (ref 0.44–1.00)
GFR calc Af Amer: 53 mL/min — ABNORMAL LOW (ref >60)
GFR, EST NON AFRICAN AMERICAN: 46 mL/min — AB (ref >60)
GLUCOSE: 107 mg/dL — AB (ref 65–99)
POTASSIUM: 3.7 mmol/L (ref 3.5–5.1)
Sodium: 139 mmol/L (ref 135–145)

## 2015-03-05 LAB — URINALYSIS, ROUTINE W REFLEX MICROSCOPIC
Bilirubin Urine: NEGATIVE
GLUCOSE, UA: NEGATIVE mg/dL
Hgb urine dipstick: NEGATIVE
Ketones, ur: NEGATIVE mg/dL
LEUKOCYTES UA: NEGATIVE
Nitrite: NEGATIVE
PH: 7.5 (ref 5.0–8.0)
PROTEIN: NEGATIVE mg/dL
Specific Gravity, Urine: 1.017 (ref 1.005–1.030)

## 2015-03-05 LAB — CBC WITH DIFFERENTIAL/PLATELET
BASOS ABS: 0 10*3/uL (ref 0.0–0.1)
Basophils Relative: 0 %
Eosinophils Absolute: 0 10*3/uL (ref 0.0–0.7)
Eosinophils Relative: 0 %
HEMATOCRIT: 36.1 % (ref 36.0–46.0)
HEMOGLOBIN: 12.1 g/dL (ref 12.0–15.0)
LYMPHS PCT: 9 %
Lymphs Abs: 0.9 10*3/uL (ref 0.7–4.0)
MCH: 30.6 pg (ref 26.0–34.0)
MCHC: 33.5 g/dL (ref 30.0–36.0)
MCV: 91.2 fL (ref 78.0–100.0)
MONO ABS: 0.6 10*3/uL (ref 0.1–1.0)
Monocytes Relative: 6 %
NEUTROS ABS: 8.1 10*3/uL — AB (ref 1.7–7.7)
Neutrophils Relative %: 85 %
Platelets: 158 10*3/uL (ref 150–400)
RBC: 3.96 MIL/uL (ref 3.87–5.11)
RDW: 14.9 % (ref 11.5–15.5)
WBC: 9.6 10*3/uL (ref 4.0–10.5)

## 2015-03-05 LAB — PROTIME-INR
INR: 2.37 — ABNORMAL HIGH (ref 0.00–1.49)
Prothrombin Time: 25.6 seconds — ABNORMAL HIGH (ref 11.6–15.2)

## 2015-03-05 MED ORDER — IOHEXOL 300 MG/ML  SOLN
75.0000 mL | Freq: Once | INTRAMUSCULAR | Status: AC | PRN
  Administered 2015-03-05: 75 mL via INTRAVENOUS

## 2015-03-05 MED ORDER — ONDANSETRON HCL 4 MG/2ML IJ SOLN
4.0000 mg | Freq: Once | INTRAMUSCULAR | Status: AC
  Administered 2015-03-05: 4 mg via INTRAVENOUS
  Filled 2015-03-05: qty 2

## 2015-03-05 MED ORDER — METRONIDAZOLE 500 MG PO TABS: 500.0000 mg | ORAL_TABLET | Freq: Three times a day (TID) | ORAL | Status: DC

## 2015-03-05 MED ORDER — IOHEXOL 300 MG/ML  SOLN
25.0000 mL | Freq: Once | INTRAMUSCULAR | Status: AC | PRN
  Administered 2015-03-05: 25 mL via ORAL

## 2015-03-05 MED ORDER — CIPROFLOXACIN HCL 500 MG PO TABS
500.0000 mg | ORAL_TABLET | Freq: Once | ORAL | Status: AC
  Administered 2015-03-05: 500 mg via ORAL
  Filled 2015-03-05: qty 1

## 2015-03-05 MED ORDER — CIPROFLOXACIN HCL 500 MG PO TABS: 500.0000 mg | ORAL_TABLET | Freq: Two times a day (BID) | ORAL | Status: DC

## 2015-03-05 MED ORDER — METRONIDAZOLE 500 MG PO TABS
500.0000 mg | ORAL_TABLET | Freq: Once | ORAL | Status: AC
  Administered 2015-03-05: 500 mg via ORAL
  Filled 2015-03-05: qty 1

## 2015-03-05 NOTE — ED Notes (Signed)
Presents with lower abd pain, onset this am, has had some nausea, but no vomiting, no diarrhea. Appetite poor today.

## 2015-03-05 NOTE — ED Notes (Signed)
Pt reports low abd pain and constipation since this morning. Last BM yesterday. Hx of diverticulitis but states this pain "feels different"

## 2015-03-05 NOTE — Discharge Instructions (Signed)
Diverticulitis °Diverticulitis is inflammation or infection of small pouches in your colon that form when you have a condition called diverticulosis. The pouches in your colon are called diverticula. Your colon, or large intestine, is where water is absorbed and stool is formed. °Complications of diverticulitis can include: °· Bleeding. °· Severe infection. °· Severe pain. °· Perforation of your colon. °· Obstruction of your colon. °CAUSES  °Diverticulitis is caused by bacteria. °Diverticulitis happens when stool becomes trapped in diverticula. This allows bacteria to grow in the diverticula, which can lead to inflammation and infection. °RISK FACTORS °People with diverticulosis are at risk for diverticulitis. Eating a diet that does not include enough fiber from fruits and vegetables may make diverticulitis more likely to develop. °SYMPTOMS  °Symptoms of diverticulitis may include: °· Abdominal pain and tenderness. The pain is normally located on the left side of the abdomen, but may occur in other areas. °· Fever and chills. °· Bloating. °· Cramping. °· Nausea. °· Vomiting. °· Constipation. °· Diarrhea. °· Blood in your stool. °DIAGNOSIS  °Your health care provider will ask you about your medical history and do a physical exam. You may need to have tests done because many medical conditions can cause the same symptoms as diverticulitis. Tests may include: °· Blood tests. °· Urine tests. °· Imaging tests of the abdomen, including X-rays and CT scans. °When your condition is under control, your health care provider may recommend that you have a colonoscopy. A colonoscopy can show how severe your diverticula are and whether something else is causing your symptoms. °TREATMENT  °Most cases of diverticulitis are mild and can be treated at home. Treatment may include: °· Taking over-the-counter pain medicines. °· Following a clear liquid diet. °· Taking antibiotic medicines by mouth for 7-10 days. °More severe cases may  be treated at a hospital. Treatment may include: °· Not eating or drinking. °· Taking prescription pain medicine. °· Receiving antibiotic medicines through an IV tube. °· Receiving fluids and nutrition through an IV tube. °· Surgery. °HOME CARE INSTRUCTIONS  °· Follow your health care provider's instructions carefully. °· Follow a full liquid diet or other diet as directed by your health care provider. After your symptoms improve, your health care provider may tell you to change your diet. He or she may recommend you eat a high-fiber diet. Fruits and vegetables are good sources of fiber. Fiber makes it easier to pass stool. °· Take fiber supplements or probiotics as directed by your health care provider. °· Only take medicines as directed by your health care provider. °· Keep all your follow-up appointments. °SEEK MEDICAL CARE IF:  °· Your pain does not improve. °· You have a hard time eating food. °· Your bowel movements do not return to normal. °SEEK IMMEDIATE MEDICAL CARE IF:  °· Your pain becomes worse. °· Your symptoms do not get better. °· Your symptoms suddenly get worse. °· You have a fever. °· You have repeated vomiting. °· You have bloody or black, tarry stools. °MAKE SURE YOU:  °· Understand these instructions. °· Will watch your condition. °· Will get help right away if you are not doing well or get worse. °  °This information is not intended to replace advice given to you by your health care provider. Make sure you discuss any questions you have with your health care provider. °  °Document Released: 09/27/2004 Document Revised: 12/23/2012 Document Reviewed: 11/12/2012 °Elsevier Interactive Patient Education ©2016 [employer] ° °

## 2015-03-05 NOTE — ED Provider Notes (Signed)
CSN: [id number]     Arrival date & time 03/05/15  1554 History  By signing my name below, I, Altamease Oiler, attest that this documentation has been prepared under the direction and in the presence of Malvin Johns, MD. Electronically Signed: Altamease Oiler, ED Scribe. 03/05/2015. 4:49 PM   Chief Complaint  Patient presents with  . Abdominal Pain   The history is provided by the patient. No language interpreter was used.   [name] [name] is a 82 y.o. female with history of IBS and diverticulitis who presents to the Emergency Department complaining of constant, waxing and waning, 5/10 in severity,  lower abdominal pain with onset this morning. Associated symptoms include decreased appetite and intermittent nausea. Pt denies fever, dysuria, difficulty urinating, diarrhea, constipation, cough, and congestion. She states that she was treated for diverticulitis in January of this year but those symptoms have mostly resolved. She did finish the abx that she was prescribed at that time. She was seen by her GI doctor yesterday but was not having the pain at that time. Past surgical history includes partial hysterectomy. PCP is Dr. Charlett Blake.   Past Medical History  Diagnosis Date  . Paroxysmal a-fib ([place])   . Mitral and aortic regurgitation   . Mitral valve prolapse     hx of  . Hypertension   . Hyperlipidemia   . Vitamin D deficiency   . Pneumonia   . Tracheitis   . Gait difficulty   . Anemia   . Melena   . IBS (irritable bowel syndrome)   . Incontinence   . GERD (gastroesophageal reflux disease)   . Osteopenia   . Thyroid disease     hypo  . Personal history of colonic polyps   . Current use of long term anticoagulation   . Cough   . Diarrhea   . Pneumonia     organism unspecified  . Constipation   . UTI (lower urinary tract infection)   . Unspecified adverse effect of other drug, medicinal and biological substance(995.29)   . History of mammogram 2009  . Diverticulosis   .  Hyperlipidemia   . Atrial fibrillation ([place])   . Arthritis     hips, knees  . Headache(784.0) 06/22/2012  . Renal insufficiency 06/28/2012  . Hyperkalemia 06/28/2012  . Anxiety   . Hypertension   . Medicare annual wellness visit, subsequent 02/05/2014  . Dysrhythmia     a-fib  . Diverticulitis of colon 02/06/2015   Past Surgical History  Procedure Laterality Date  . Breast biopsy Left   . Cardiac electrophysiology mapping and ablation    . Partial hysterectomy      ovaries left in place  . Cataract extraction, bilateral    . Esophagoscopy w/ botox injection    . Esophagogastroduodenoscopy N/A 09/03/2012    Procedure: ESOPHAGOGASTRODUODENOSCOPY (EGD);  Surgeon: Inda Castle, MD;  Location: [facility];  Service: Endoscopy;  Laterality: N/A;  . Botox injection N/A 09/03/2012    Procedure: BOTOX INJECTION;  Surgeon: Inda Castle, MD;  Location: [facility];  Service: Endoscopy;  Laterality: N/A;  . I&d extremity Right 11/06/2012    Procedure: IRRIGATION AND DEBRIDEMENT EXTREMITY Right Ring Finger;  Surgeon: Tennis Must, MD;  Location: [facility];  Service: Orthopedics;  Laterality: Right;  . Breast lumpectomy Right 08/18/2014    Procedure: RIGHT BREAST LUMPECTOMY;  Surgeon: Coralie Keens, MD;  Location: [facility];  Service: General;  Laterality: Right;   Family History  Problem Relation Age  of Onset  . Diabetes Mother   . CVA Mother   . COPD Father   . Rheum arthritis Father   . Heart disease Maternal Aunt   . Heart disease Maternal Uncle   . Allergies Daughter   . COPD Daughter   . Stroke Daughter   . COPD Daughter     previous smoker  . Stroke Maternal Grandfather   . Colon cancer Neg Hx   . Colon polyps Neg Hx   . Esophageal cancer Neg Hx   . Gallbladder disease Neg Hx   . Kidney disease Neg Hx   . Stroke Mother   . Heart attack Neg Hx   . Hypertension Neg Hx    Social History  Substance Use Topics  . Smoking status: Never Smoker   . Smokeless  tobacco: Never Used  . Alcohol Use: No   OB History    No data available     Review of Systems  Constitutional: Positive for appetite change. Negative for fever, chills, diaphoresis and fatigue.  HENT: Negative for congestion, rhinorrhea and sneezing.   Eyes: Negative.   Respiratory: Negative for cough, chest tightness and shortness of breath.   Cardiovascular: Negative for chest pain and leg swelling.  Gastrointestinal: Positive for nausea and abdominal pain. Negative for vomiting, diarrhea, constipation and blood in stool.  Genitourinary: Negative for dysuria, frequency, hematuria, flank pain and difficulty urinating.  Musculoskeletal: Negative for back pain and arthralgias.  Skin: Negative for rash.  Neurological: Negative for dizziness, speech difficulty, weakness, numbness and headaches.    Allergies  Darifenacin hydrobromide; Sulfonamide derivatives; and Tramadol  Home Medications   Prior to Admission medications   Medication Sig Start Date End Date Taking? Authorizing Provider  ALPRAZolam Duanne Moron) 0.25 MG tablet TAKE ONE TABLET BY MOUTH THREE TIMES A DAY AS NEEDED 01/10/15  Yes Mosie Lukes, MD  atorvastatin (LIPITOR) 10 MG tablet TAKE 1 TABLET (10 MG TOTAL) BY MOUTH DAILY AT 6 PM. 09/30/14  Yes Mosie Lukes, MD  Cholecalciferol (VITAMIN D) 2000 UNITS CAPS Take 2,000 Units by mouth every other day. 02/20/12  Yes Mosie Lukes, MD  metoprolol tartrate (LOPRESSOR) 25 MG tablet Take 12.5 mg by mouth 2 (two) times daily.   Yes Historical Provider, MD  pantoprazole (PROTONIX) 40 MG tablet TAKE 1 TABLET (40 MG TOTAL) BY MOUTH DAILY. 09/30/14  Yes Mosie Lukes, MD  ramipril (ALTACE) 10 MG capsule TAKE 1 CAPSULE (10 MG TOTAL) BY MOUTH DAILY. 01/23/15  Yes Mosie Lukes, MD  ranitidine (ZANTAC) 150 MG tablet Take 1 tablet (150 mg total) by mouth 2 (two) times daily. 02/01/15  Yes Mosie Lukes, MD  vitamin B-12 (CYANOCOBALAMIN) 1000 MCG tablet Take 1 tablet (1,000 mcg total) by mouth  daily. 02/20/12  Yes Mosie Lukes, MD  warfarin (COUMADIN) 2.5 MG tablet TAKE AS DIRECTED BY ANTICOAGULATION CLINIC 11/24/14  Yes Dorothy Spark, MD  zoster vaccine live, PF, (ZOSTAVAX) 29562 UNT/0.65ML injection Inject 19,400 Units into the skin once. 08/31/14  Yes Mosie Lukes, MD  ciprofloxacin (CIPRO) 500 MG tablet Take 1 tablet (500 mg total) by mouth 2 (two) times daily. One po bid x 7 days 03/05/15   Malvin Johns, MD  metroNIDAZOLE (FLAGYL) 500 MG tablet Take 1 tablet (500 mg total) by mouth 3 (three) times daily. One po bid x 7 days 03/05/15   Malvin Johns, MD   BP 187/77 mmHg  Pulse 68  Temp(Src) 98.8 F (37.1 C) (Oral)  Resp 18  Ht 4\' 11"  (1.499 m)  Wt 106 lb (48.081 kg)  BMI 21.40 kg/m2  SpO2 97% Physical Exam  Constitutional: She is oriented to person, place, and time. She appears well-developed and well-nourished. No distress.  HENT:  Head: Normocephalic and atraumatic.  Eyes: EOM are normal. Pupils are equal, round, and reactive to light.  Neck: Normal range of motion. Neck supple.  Cardiovascular: Normal rate, regular rhythm and normal heart sounds.   Pulmonary/Chest: Effort normal and breath sounds normal. No respiratory distress. She has no wheezes. She has no rales. She exhibits no tenderness.  Abdominal: Soft. Bowel sounds are normal. She exhibits no distension. There is tenderness. There is no rebound and no guarding.  TTP across the lower abdomen and at the left mid and lower abdomen  Musculoskeletal: Normal range of motion. She exhibits no edema.  Lymphadenopathy:    She has no cervical adenopathy.  Neurological: She is alert and oriented to person, place, and time.  Skin: Skin is warm and dry. No rash noted.  Psychiatric: She has a normal mood and affect. Judgment normal.  Nursing note and vitals reviewed.   ED Course  Procedures (including critical care time) DIAGNOSTIC STUDIES: Oxygen Saturation is 97% on RA,  normal by my interpretation.     COORDINATION OF CARE: 4:38 PM Discussed treatment plan which includes lab work and CT A/P with contrast with pt at bedside and pt agreed to plan.  Labs Review Labs Reviewed  URINALYSIS, ROUTINE W REFLEX MICROSCOPIC (NOT AT [facility]) - Abnormal; Notable for the following:    APPearance CLOUDY (*)    All other components within normal limits  BASIC METABOLIC PANEL - Abnormal; Notable for the following:    Glucose, Bld 107 (*)    Creatinine, Ser 1.05 (*)    GFR calc non Af Amer 46 (*)    GFR calc Af Amer 53 (*)    All other components within normal limits  CBC WITH DIFFERENTIAL/PLATELET - Abnormal; Notable for the following:    Neutro Abs 8.1 (*)    All other components within normal limits  PROTIME-INR - Abnormal; Notable for the following:    Prothrombin Time 25.6 (*)    INR 2.37 (*)    All other components within normal limits    Imaging Review Ct Abdomen Pelvis W Contrast  03/05/2015  CLINICAL DATA:  Lower abdominal pain. Left lower quadrant pain. History of diverticulitis. Constipation. EXAM: CT ABDOMEN AND PELVIS WITH CONTRAST TECHNIQUE: Multidetector CT imaging of the abdomen and pelvis was performed using the standard protocol following bolus administration of intravenous contrast. CONTRAST:  2mL OMNIPAQUE IOHEXOL 300 MG/ML SOLN, 83mL OMNIPAQUE IOHEXOL 300 MG/ML SOLN COMPARISON:  01/27/2015. FINDINGS: Lower chest: Clear lung bases. Mild cardiomegaly. No pericardial or pleural effusion. A moderate hiatal hernia, incompletely imaged. Hepatobiliary: Normal liver. Normal gallbladder, without biliary ductal dilatation. Pancreas: Normal, without mass or ductal dilatation. Spleen: Subcentimeter low-density splenic lesions are of doubtful clinical significance. Adrenals/Urinary Tract: Normal adrenal glands. Too small to characterize lesions in both kidneys. No hydronephrosis. Normal urinary bladder. Stomach/Bowel: Normal remainder of the stomach. Extensive colonic diverticulosis. Moderate  sigmoid wall thickening with mild surrounding inflammation. No free perforation or drainable abscess. Normal small bowel. Vascular/Lymphatic: Aortic and branch vessel atherosclerosis. No abdominopelvic adenopathy. Reproductive: Hysterectomy.  No adnexal mass. Other: No significant free fluid. Significant pelvic floor laxity with enterocele and rectal prolapse. Musculoskeletal: Disc bulge at L4-5. IMPRESSION: 1. Non complicated sigmoid diverticulitis. 2. Pelvic floor laxity with enterocele  and rectal prolapse. 3. Moderate to large hiatal hernia, incompletely imaged. Electronically Signed   By: Abigail Miyamoto M.D.   On: 03/05/2015 18:24   I have personally reviewed and evaluated these images and lab results as part of my medical decision-making.   EKG Interpretation None      MDM   Final diagnoses:  Diverticulitis large intestine w/o perforation or abscess w/o bleeding    Patient presents with lower abdominal pain as well as left-sided abdominal pain. Her CT scan shows evidence of diverticulitis. There is no evidence of perforation or abscess. Her urinalysis is clear without evidence of infection. Her creatinine is mildly elevated but similar to baseline values. Her other labs are unremarkable. She was discharged home in good condition. She is tolerating by mouth fluids. She was started on Cipro and Flagyl. She was encouraged to follow-up with her gastroenterologist if her symptoms are not improving within the next 2-3 days. She was advised to return here if she has any worsening symptoms. She is on Coumadin and was advised to notify her Coumadin clinic that she has started the antibiotics so they can more closely monitor her INR.  I personally performed the services described in this documentation, which was scribed in my presence.  The recorded information has been reviewed and considered.    Malvin Johns, MD 03/05/15 [phone]

## 2015-03-05 NOTE — ED Notes (Signed)
DC instructions reviewed with pt, also discussed the two Rx as written by EDP, discussed s/s to return to ED. Opportunity for questions provided. Family member with pt

## 2015-03-05 NOTE — ED Notes (Signed)
Patient transported to CT via stretcher, sr x 2 up  

## 2015-03-06 ENCOUNTER — Telehealth: Payer: Self-pay | Admitting: *Deleted

## 2015-03-06 NOTE — Telephone Encounter (Signed)
metroNIDAZOLE (FLAGYL) 500 MG tablet    Sig: Take 1 tablet (500 mg total) by mouth 3 (three) times daily. One po bid x 7 days     Start: 03/05/15    Quantity: 21 tablet Refills: 0           After reviewing the Rx clarified that it should read 1 tablet by mouth 3 times daily #21 tablets. No further questions.

## 2015-03-08 ENCOUNTER — Ambulatory Visit: Payer: Medicare Other

## 2015-03-08 ENCOUNTER — Ambulatory Visit (INDEPENDENT_AMBULATORY_CARE_PROVIDER_SITE_OTHER): Payer: Medicare Other | Admitting: *Deleted

## 2015-03-08 DIAGNOSIS — I4891 Unspecified atrial fibrillation: Secondary | ICD-10-CM

## 2015-03-08 DIAGNOSIS — Z5181 Encounter for therapeutic drug level monitoring: Secondary | ICD-10-CM | POA: Diagnosis not present

## 2015-03-08 DIAGNOSIS — Z7901 Long term (current) use of anticoagulants: Secondary | ICD-10-CM | POA: Diagnosis not present

## 2015-03-08 LAB — POCT INR: INR: 3.3

## 2015-03-10 ENCOUNTER — Telehealth: Payer: Self-pay | Admitting: Gastroenterology

## 2015-03-10 ENCOUNTER — Encounter (HOSPITAL_BASED_OUTPATIENT_CLINIC_OR_DEPARTMENT_OTHER): Payer: Self-pay | Admitting: *Deleted

## 2015-03-10 ENCOUNTER — Emergency Department (HOSPITAL_BASED_OUTPATIENT_CLINIC_OR_DEPARTMENT_OTHER)
Admission: EM | Admit: 2015-03-10 | Discharge: 2015-03-11 | Disposition: A | Discharge status: Home / Self Care | Payer: Medicare Other | Attending: Emergency Medicine | Admitting: Emergency Medicine

## 2015-03-10 DIAGNOSIS — Z79899 Other long term (current) drug therapy: Secondary | ICD-10-CM | POA: Diagnosis not present

## 2015-03-10 DIAGNOSIS — T368X5A Adverse effect of other systemic antibiotics, initial encounter: Secondary | ICD-10-CM | POA: Insufficient documentation

## 2015-03-10 DIAGNOSIS — K219 Gastro-esophageal reflux disease without esophagitis: Secondary | ICD-10-CM | POA: Insufficient documentation

## 2015-03-10 DIAGNOSIS — Z8709 Personal history of other diseases of the respiratory system: Secondary | ICD-10-CM | POA: Insufficient documentation

## 2015-03-10 DIAGNOSIS — Z7901 Long term (current) use of anticoagulants: Secondary | ICD-10-CM | POA: Insufficient documentation

## 2015-03-10 DIAGNOSIS — Z862 Personal history of diseases of the blood and blood-forming organs and certain disorders involving the immune mechanism: Secondary | ICD-10-CM | POA: Diagnosis not present

## 2015-03-10 DIAGNOSIS — Z87440 Personal history of urinary (tract) infections: Secondary | ICD-10-CM | POA: Insufficient documentation

## 2015-03-10 DIAGNOSIS — F419 Anxiety disorder, unspecified: Secondary | ICD-10-CM | POA: Insufficient documentation

## 2015-03-10 DIAGNOSIS — I10 Essential (primary) hypertension: Secondary | ICD-10-CM | POA: Insufficient documentation

## 2015-03-10 DIAGNOSIS — R110 Nausea: Secondary | ICD-10-CM | POA: Diagnosis present

## 2015-03-10 DIAGNOSIS — Z8701 Personal history of pneumonia (recurrent): Secondary | ICD-10-CM | POA: Insufficient documentation

## 2015-03-10 DIAGNOSIS — M8580 Other specified disorders of bone density and structure, unspecified site: Secondary | ICD-10-CM | POA: Diagnosis not present

## 2015-03-10 DIAGNOSIS — I480 Paroxysmal atrial fibrillation: Secondary | ICD-10-CM | POA: Insufficient documentation

## 2015-03-10 DIAGNOSIS — E785 Hyperlipidemia, unspecified: Secondary | ICD-10-CM | POA: Diagnosis not present

## 2015-03-10 DIAGNOSIS — Z87448 Personal history of other diseases of urinary system: Secondary | ICD-10-CM | POA: Diagnosis not present

## 2015-03-10 DIAGNOSIS — Z86010 Personal history of colonic polyps: Secondary | ICD-10-CM | POA: Diagnosis not present

## 2015-03-10 LAB — CBC WITH DIFFERENTIAL/PLATELET
BASOS ABS: 0 10*3/uL (ref 0.0–0.1)
BASOS PCT: 0 %
EOS ABS: 0 10*3/uL (ref 0.0–0.7)
EOS PCT: 1 %
HEMATOCRIT: 37.6 % (ref 36.0–46.0)
Hemoglobin: 12.5 g/dL (ref 12.0–15.0)
Lymphocytes Relative: 16 %
Lymphs Abs: 1 10*3/uL (ref 0.7–4.0)
MCH: 30.5 pg (ref 26.0–34.0)
MCHC: 33.2 g/dL (ref 30.0–36.0)
MCV: 91.7 fL (ref 78.0–100.0)
MONO ABS: 0.5 10*3/uL (ref 0.1–1.0)
MONOS PCT: 9 %
NEUTROS ABS: 4.5 10*3/uL (ref 1.7–7.7)
Neutrophils Relative %: 74 %
PLATELETS: 177 10*3/uL (ref 150–400)
RBC: 4.1 MIL/uL (ref 3.87–5.11)
RDW: 14.8 % (ref 11.5–15.5)
WBC: 6 10*3/uL (ref 4.0–10.5)

## 2015-03-10 MED ORDER — SODIUM CHLORIDE 0.9 % IV BOLUS (SEPSIS)
1000.0000 mL | Freq: Once | INTRAVENOUS | Status: AC
  Administered 2015-03-10: 1000 mL via INTRAVENOUS

## 2015-03-10 MED ORDER — ONDANSETRON HCL 8 MG PO TABS: 8.0000 mg | ORAL_TABLET | Freq: Three times a day (TID) | ORAL | Status: DC | PRN

## 2015-03-10 MED ORDER — DICYCLOMINE HCL 10 MG PO CAPS
10.0000 mg | ORAL_CAPSULE | Freq: Once | ORAL | Status: AC
  Administered 2015-03-10: 10 mg via ORAL
  Filled 2015-03-10: qty 1

## 2015-03-10 MED ORDER — DICYCLOMINE HCL 10 MG PO CAPS: 20.0000 mg | ORAL_CAPSULE | Freq: Once | ORAL | Status: DC

## 2015-03-10 MED ORDER — ONDANSETRON HCL 4 MG/2ML IJ SOLN
4.0000 mg | Freq: Once | INTRAMUSCULAR | Status: AC
  Administered 2015-03-10: 4 mg via INTRAVENOUS
  Filled 2015-03-10: qty 2

## 2015-03-10 NOTE — ED Provider Notes (Signed)
CSN: [id number]     Arrival date & time 03/10/15  2126 History  By signing my name below, I, [name] [name], attest that this documentation has been prepared under the direction and in the presence of [name] Balls, MD. Electronically Signed: Jolayne [name], Scribe. 03/10/2015. 11:58 PM.  Chief Complaint  Patient presents with  . Nausea   The history is provided by the patient. No language interpreter was used.    HPI Comments: TENNIA [name] is a 82 y.o. female who presents to the Emergency Department complaining of sudden onset, constant nausea which began immediately after she took her first dose of diverticulitis medication earlier this evening, which she was prescribed earlier today after diverticulitis was found on her CT scan. She also reports associated soft stool after taking her medication and notes that she was prescribed nausea medication, which she took earlier with brief, mild relief. She also notes mild abdominal pain, which is mainly due to the fact that she has been unable to eat very much today. Pt denies vomiting, diarrhea, chest pain, and SOB.    Past Medical History  Diagnosis Date  . Paroxysmal a-fib ([place])   . Mitral and aortic regurgitation   . Mitral valve prolapse     hx of  . Hypertension   . Hyperlipidemia   . Vitamin D deficiency   . Pneumonia   . Tracheitis   . Gait difficulty   . Anemia   . Melena   . IBS (irritable bowel syndrome)   . Incontinence   . GERD (gastroesophageal reflux disease)   . Osteopenia   . Thyroid disease     hypo  . Personal history of colonic polyps   . Current use of long term anticoagulation   . Cough   . Diarrhea   . Pneumonia     organism unspecified  . Constipation   . UTI (lower urinary tract infection)   . Unspecified adverse effect of other drug, medicinal and biological substance(995.29)   . History of mammogram 2009  . Diverticulosis   . Hyperlipidemia   . Atrial fibrillation ([place])   . Arthritis      hips, knees  . Headache(784.0) 06/22/2012  . Renal insufficiency 06/28/2012  . Hyperkalemia 06/28/2012  . Anxiety   . Hypertension   . Medicare annual wellness visit, subsequent 02/05/2014  . Dysrhythmia     a-fib  . Diverticulitis of colon 02/06/2015   Past Surgical History  Procedure Laterality Date  . Breast biopsy Left   . Cardiac electrophysiology mapping and ablation    . Partial hysterectomy      ovaries left in place  . Cataract extraction, bilateral    . Esophagoscopy w/ botox injection    . Esophagogastroduodenoscopy N/A 09/03/2012    Procedure: ESOPHAGOGASTRODUODENOSCOPY (EGD);  Surgeon: Inda Castle, MD;  Location: [facility];  Service: Endoscopy;  Laterality: N/A;  . Botox injection N/A 09/03/2012    Procedure: BOTOX INJECTION;  Surgeon: Inda Castle, MD;  Location: [facility];  Service: Endoscopy;  Laterality: N/A;  . I&d extremity Right 11/06/2012    Procedure: IRRIGATION AND DEBRIDEMENT EXTREMITY Right Ring Finger;  Surgeon: Tennis Must, MD;  Location: [facility];  Service: Orthopedics;  Laterality: Right;  . Breast lumpectomy Right 08/18/2014    Procedure: RIGHT BREAST LUMPECTOMY;  Surgeon: Coralie Keens, MD;  Location: [facility];  Service: General;  Laterality: Right;   Family History  Problem Relation Age of Onset  . Diabetes  Mother   . CVA Mother   . COPD Father   . Rheum arthritis Father   . Heart disease Maternal Aunt   . Heart disease Maternal Uncle   . Allergies Daughter   . COPD Daughter   . Stroke Daughter   . COPD Daughter     previous smoker  . Stroke Maternal Grandfather   . Colon cancer Neg Hx   . Colon polyps Neg Hx   . Esophageal cancer Neg Hx   . Gallbladder disease Neg Hx   . Kidney disease Neg Hx   . Stroke Mother   . Heart attack Neg Hx   . Hypertension Neg Hx    Social History  Substance Use Topics  . Smoking status: Never Smoker   . Smokeless tobacco: Never Used  . Alcohol Use: No   OB History    No data  available     Review of Systems A complete 10 system review of systems was obtained and all systems are negative except as noted in the HPI and PMH.   Allergies  Darifenacin hydrobromide; Sulfonamide derivatives; and Tramadol  Home Medications   Prior to Admission medications   Medication Sig Start Date End Date Taking? Authorizing Provider  ALPRAZolam Duanne Moron) 0.25 MG tablet TAKE ONE TABLET BY MOUTH THREE TIMES A DAY AS NEEDED 01/10/15   Mosie Lukes, MD  atorvastatin (LIPITOR) 10 MG tablet TAKE 1 TABLET (10 MG TOTAL) BY MOUTH DAILY AT 6 PM. 09/30/14   Mosie Lukes, MD  Cholecalciferol (VITAMIN D) 2000 UNITS CAPS Take 2,000 Units by mouth every other day. 02/20/12   Mosie Lukes, MD  ciprofloxacin (CIPRO) 500 MG tablet Take 1 tablet (500 mg total) by mouth 2 (two) times daily. One po bid x 7 days 03/05/15   Malvin Johns, MD  metoprolol tartrate (LOPRESSOR) 25 MG tablet Take 12.5 mg by mouth 2 (two) times daily.    Historical Provider, MD  metroNIDAZOLE (FLAGYL) 500 MG tablet Take 1 tablet (500 mg total) by mouth 3 (three) times daily. One po bid x 7 days 03/05/15   Malvin Johns, MD  ondansetron (ZOFRAN) 8 MG tablet Take 1 tablet (8 mg total) by mouth every 8 (eight) hours as needed for nausea or vomiting. 03/10/15   Nelida Meuse III, MD  pantoprazole (PROTONIX) 40 MG tablet TAKE 1 TABLET (40 MG TOTAL) BY MOUTH DAILY. 09/30/14   Mosie Lukes, MD  ramipril (ALTACE) 10 MG capsule TAKE 1 CAPSULE (10 MG TOTAL) BY MOUTH DAILY. 01/23/15   Mosie Lukes, MD  ranitidine (ZANTAC) 150 MG tablet Take 1 tablet (150 mg total) by mouth 2 (two) times daily. 02/01/15   Mosie Lukes, MD  vitamin B-12 (CYANOCOBALAMIN) 1000 MCG tablet Take 1 tablet (1,000 mcg total) by mouth daily. 02/20/12   Mosie Lukes, MD  warfarin (COUMADIN) 2.5 MG tablet TAKE AS DIRECTED BY ANTICOAGULATION CLINIC 11/24/14   Dorothy Spark, MD  zoster vaccine live, PF, (ZOSTAVAX) 16109 UNT/0.65ML injection Inject 19,400 Units into the  skin once. 08/31/14   Mosie Lukes, MD   BP 164/75 mmHg  Pulse 60  Temp(Src) 97.8 F (36.6 C) (Oral)  Resp 20  Ht 4\' 11"  (1.499 m)  Wt 106 lb (48.081 kg)  BMI 21.40 kg/m2  SpO2 98% Physical Exam  Constitutional: She is oriented to person, place, and time. She appears well-developed and well-nourished. No distress.  HENT:  Head: Normocephalic and atraumatic.  Nose: Nose normal.  Mouth/Throat: Oropharynx is clear and moist. No oropharyngeal exudate.  Eyes: Conjunctivae and EOM are normal. Pupils are equal, round, and reactive to light. No scleral icterus.  Neck: Normal range of motion. Neck supple. No JVD present. No tracheal deviation present. No thyromegaly present.  Cardiovascular: Normal rate, regular rhythm and normal heart sounds.  Exam reveals no gallop and no friction rub.   No murmur heard. Pulmonary/Chest: Effort normal and breath sounds normal. No respiratory distress. She has no wheezes. She exhibits no tenderness.  Abdominal: Soft. Bowel sounds are normal. She exhibits no distension and no mass. There is no tenderness. There is no rebound and no guarding.  Musculoskeletal: Normal range of motion. She exhibits no edema or tenderness.  Lymphadenopathy:    She has no cervical adenopathy.  Neurological: She is alert and oriented to person, place, and time. No cranial nerve deficit. She exhibits normal muscle tone.  Skin: Skin is warm and dry. No rash noted. No erythema. No pallor.  Nursing note and vitals reviewed.   ED Course  Procedures  DIAGNOSTIC STUDIES:    Oxygen Saturation is 98% on RA, normal by my interpretation.  COORDINATION OF CARE:  11:22 PM Will review pt's CT scan. Will administer pt IV fluids. Will order blood work and urinalysis. Discussed treatment plan with pt at bedside and pt agreed to plan.   Labs Review Labs Reviewed  COMPREHENSIVE METABOLIC PANEL - Abnormal; Notable for the following:    Glucose, Bld 126 (*)    BUN 22 (*)    Creatinine,  Ser 1.65 (*)    Calcium 8.8 (*)    AST 14 (*)    ALT 10 (*)    Alkaline Phosphatase 12 (*)    GFR calc non Af Amer 26 (*)    GFR calc Af Amer 31 (*)    All other components within normal limits  CBC WITH DIFFERENTIAL/PLATELET  LIPASE, BLOOD  URINALYSIS, ROUTINE W REFLEX MICROSCOPIC (NOT AT [facility])   I have personally reviewed and evaluated these images and lab results as part of my medical decision-making.  MDM   Final diagnoses:  None    Patient presents to the ED for nausea and upset stomach.  This is in the setting of taking ciprofloxacin and Flagyl for recent diverticulitis diagnosis. She states her abdominal pain is gone away in her bowel movements are normal. She has persistent nausea now. She denies any vomiting. She was given Zofran and Bentyl for relief. We'll obtain laboratory studies. I do not believe repeat CT scan is warranted as she just had one 4 days ago. We'll PO challenge in the emergency department.  Upon repeat evaluation, patient states she feels better and is able to tolerate food.  She has some AKI and was given IVF.  I advised her to DC abx and see her PCP within 3 days for close follow up.  She appears well and in NAD.  VS remain within her normal limits and she is safe for DC.   I personally performed the services described in this documentation, which was scribed in my presence. The recorded information has been reviewed and is accurate.       [name] Balls, MD 03/11/15 [phone]

## 2015-03-10 NOTE — Telephone Encounter (Signed)
Sorry to hear that she had more trouble with the diverticulitis. Nausea is a frequent side effect of the metronidazole, which she needs for this diverticulitis. I am afraid that Zofran is really the only safe nausea medicine to take at her age.  Please send her a Rx for increased dose of 8 mg every 8 hours prn.    Please also make sure she was given a total antibiotic course of at least 10 days

## 2015-03-10 NOTE — Telephone Encounter (Signed)
Left message on machine to call back  

## 2015-03-10 NOTE — Telephone Encounter (Signed)
Prescription has been sent to the pharmacy, Left message on machine to call back

## 2015-03-10 NOTE — Telephone Encounter (Signed)
Pt aware that the prescription for zofran has been sent to the pharmacy and she has a full 10 day course of abx, she was advised to finish all of the medication and to stay on clear liquids until she feels better and she can then gradually increase her diet as tolerated.  She will call back tomorrow if she has no improvement or worsens.

## 2015-03-10 NOTE — Telephone Encounter (Signed)
Patient daughter [name] [name] returned phone call. [phone]

## 2015-03-10 NOTE — ED Notes (Addendum)
Nausea since starting on new medication. She was seen 5 days ago and given medication for Diverticulitis. No vomiting.

## 2015-03-10 NOTE — Telephone Encounter (Signed)
Pt was seen in the ED on Saturday with diverticulitis, she was put on flagyl and cipro.  Since Saturday she has become nauseated.  No pain since saturday, no fever, no change in bowels. She was on the same medication regimen in January and also became sick.  Would like something for nausea.  She was given zofran 4 mg ODT every 8 hours in the ED but is not helping.  Please advise.

## 2015-03-11 ENCOUNTER — Ambulatory Visit (INDEPENDENT_AMBULATORY_CARE_PROVIDER_SITE_OTHER): Payer: Medicare Other | Admitting: Surgery

## 2015-03-11 DIAGNOSIS — I4891 Unspecified atrial fibrillation: Secondary | ICD-10-CM | POA: Diagnosis not present

## 2015-03-11 DIAGNOSIS — Z5181 Encounter for therapeutic drug level monitoring: Secondary | ICD-10-CM | POA: Diagnosis not present

## 2015-03-11 DIAGNOSIS — Z7901 Long term (current) use of anticoagulants: Secondary | ICD-10-CM

## 2015-03-11 LAB — COMPREHENSIVE METABOLIC PANEL
ALBUMIN: 3.8 g/dL (ref 3.5–5.0)
ALT: 10 U/L — ABNORMAL LOW (ref 14–54)
ANION GAP: 11 (ref 5–15)
AST: 14 U/L — AB (ref 15–41)
Alkaline Phosphatase: 12 U/L — ABNORMAL LOW (ref 38–126)
BILIRUBIN TOTAL: 0.4 mg/dL (ref 0.3–1.2)
BUN: 22 mg/dL — AB (ref 6–20)
CHLORIDE: 101 mmol/L (ref 101–111)
CO2: 26 mmol/L (ref 22–32)
Calcium: 8.8 mg/dL — ABNORMAL LOW (ref 8.9–10.3)
Creatinine, Ser: 1.65 mg/dL — ABNORMAL HIGH (ref 0.44–1.00)
GFR calc Af Amer: 31 mL/min — ABNORMAL LOW (ref >60)
GFR, EST NON AFRICAN AMERICAN: 26 mL/min — AB (ref >60)
Glucose, Bld: 126 mg/dL — ABNORMAL HIGH (ref 65–99)
POTASSIUM: 3.5 mmol/L (ref 3.5–5.1)
Sodium: 138 mmol/L (ref 135–145)
TOTAL PROTEIN: 7.3 g/dL (ref 6.5–8.1)

## 2015-03-11 LAB — URINALYSIS, ROUTINE W REFLEX MICROSCOPIC
Bilirubin Urine: NEGATIVE
Glucose, UA: NEGATIVE mg/dL
Hgb urine dipstick: NEGATIVE
KETONES UR: NEGATIVE mg/dL
LEUKOCYTES UA: NEGATIVE
NITRITE: NEGATIVE
PROTEIN: NEGATIVE mg/dL
Specific Gravity, Urine: 1.012 (ref 1.005–1.030)
pH: 5 (ref 5.0–8.0)

## 2015-03-11 LAB — LIPASE, BLOOD: LIPASE: 20 U/L (ref 11–51)

## 2015-03-11 LAB — POCT INR: INR: 4.9

## 2015-03-11 MED ORDER — PROMETHAZINE HCL 25 MG/ML IJ SOLN
12.5000 mg | Freq: Once | INTRAMUSCULAR | Status: AC
Start: 2015-03-11 — End: 2015-03-11
  Administered 2015-03-11: 12.5 mg via INTRAVENOUS

## 2015-03-11 MED ORDER — PROMETHAZINE HCL 25 MG/ML IJ SOLN
INTRAMUSCULAR | Status: AC
  Filled 2015-03-11: qty 1

## 2015-03-11 NOTE — ED Notes (Signed)
Pt sipping ginger ale prior to phenergan and stated she wasn't feeling good drinking it. EDP informed. Family at bedside. NAD noted.

## 2015-03-11 NOTE — Discharge Instructions (Signed)
Nausea, [name] [name], your blood work here shows some dehydration.  Be sure to drink plenty of fluids and see your primary care doctor within 3 days for close follow up.  Do not take your antibiotics anymore.  Take zofran as needed for your nausea.  If any symptoms worsen, come back to the ED immediately. Thank you. Nausea means you feel sick to your stomach or need to throw up (vomit). It may be a sign of a more serious problem. If nausea gets worse, you may throw up. If you throw up a lot, you may lose too much body fluid (dehydration). HOME CARE   Get plenty of rest.  Ask your doctor how to replace body fluid losses (rehydrate).  Eat small amounts of food. Sip liquids more often.  Take all medicines as told by your doctor. GET HELP RIGHT AWAY IF:  You have a fever.  You pass out (faint).  You keep throwing up or have blood in your throw up.  You are very weak, have dry lips or a dry mouth, or you are very thirsty (dehydrated).  You have dark or bloody poop (stool).  You have very bad chest or belly (abdominal) pain.  You do not get better after 2 days, or you get worse.  You have a headache. MAKE SURE YOU:  Understand these instructions.  Will watch your condition.  Will get help right away if you are not doing well or get worse.   This information is not intended to replace advice given to you by your health care provider. Make sure you discuss any questions you have with your health care provider.   Document Released: 12/07/2010 Document Revised: 03/12/2011 Document Reviewed: 12/07/2010 Elsevier Interactive Patient Education [employer].

## 2015-03-16 ENCOUNTER — Ambulatory Visit (INDEPENDENT_AMBULATORY_CARE_PROVIDER_SITE_OTHER): Payer: Medicare Other | Admitting: Physician Assistant

## 2015-03-16 ENCOUNTER — Encounter: Payer: Self-pay | Admitting: Physician Assistant

## 2015-03-16 VITALS — BP 140/70 | HR 51 | Temp 97.7°F | Ht 59.0 in | Wt 104.2 lb

## 2015-03-16 DIAGNOSIS — N289 Disorder of kidney and ureter, unspecified: Secondary | ICD-10-CM

## 2015-03-16 DIAGNOSIS — K5732 Diverticulitis of large intestine without perforation or abscess without bleeding: Secondary | ICD-10-CM

## 2015-03-16 LAB — BASIC METABOLIC PANEL
BUN: 18 mg/dL (ref 6–23)
CHLORIDE: 103 meq/L (ref 96–112)
CO2: 33 mEq/L — ABNORMAL HIGH (ref 19–32)
CREATININE: 1.49 mg/dL — AB (ref 0.40–1.20)
Calcium: 9.2 mg/dL (ref 8.4–10.5)
GFR: 35.01 mL/min — ABNORMAL LOW (ref >60.00)
Glucose, Bld: 96 mg/dL (ref 70–99)
Potassium: 3.7 mEq/L (ref 3.5–5.1)
SODIUM: 144 meq/L (ref 135–145)

## 2015-03-16 NOTE — Assessment & Plan Note (Signed)
Mild elevation in BUN and Creatinine (1.65) in ER. Will repeat BMP today now that patient is off of antibiotics and eating/hydrating well.

## 2015-03-16 NOTE — Assessment & Plan Note (Signed)
Clinically resolved. CBC in ER within normal limits. No recurrence of symptoms after stopping antibiotic. Supportive and dietary measures reviewed. Follow-up with Dr. Charlett Blake as scheduled.  Return immediately or go to ER if anything recurs

## 2015-03-16 NOTE — Progress Notes (Signed)
Pre visit review using our clinic review tool, if applicable. No additional management support is needed unless otherwise documented below in the visit note. 

## 2015-03-16 NOTE — Patient Instructions (Signed)
I am glad you are doing better! Please resume your solid foods. I would recommend a Boost or Ensure daily as well. Don't forget to stop by the lab for blood work.  If you notice any recurrence of abdominal pain or nausea, come see [place] immediately.   Follow-up with Dr. Charlett Blake as scheduled.  Diverticulosis Diverticulosis is the condition that develops when small pouches (diverticula) form in the wall of your colon. Your colon, or large intestine, is where water is absorbed and stool is formed. The pouches form when the inside layer of your colon pushes through weak spots in the outer layers of your colon. CAUSES  No one knows exactly what causes diverticulosis. RISK FACTORS  Being older than 56. Your risk for this condition increases with age. Diverticulosis is rare in people younger than 40 years. By age 35, almost everyone has it.  Eating a low-fiber diet.  Being frequently constipated.  Being overweight.  Not getting enough exercise.  Smoking.  Taking over-the-counter pain medicines, like aspirin and ibuprofen. SYMPTOMS  Most people with diverticulosis do not have symptoms. DIAGNOSIS  Because diverticulosis often has no symptoms, health care providers often discover the condition during an exam for other colon problems. In many cases, a health care provider will diagnose diverticulosis while using a flexible scope to examine the colon (colonoscopy). TREATMENT  If you have never developed an infection related to diverticulosis, you may not need treatment. If you have had an infection before, treatment may include:  Eating more fruits, vegetables, and grains.  Taking a fiber supplement.  Taking a live bacteria supplement (probiotic).  Taking medicine to relax your colon. HOME CARE INSTRUCTIONS   Drink at least 6-8 glasses of water each day to prevent constipation.  Try not to strain when you have a bowel movement.  Keep all follow-up appointments. If you have had an  infection before:  Increase the fiber in your diet as directed by your health care provider or dietitian.  Take a dietary fiber supplement if your health care provider approves.  Only take medicines as directed by your health care provider. SEEK MEDICAL CARE IF:   You have abdominal pain.  You have bloating.  You have cramps.  You have not gone to the bathroom in 3 days. SEEK IMMEDIATE MEDICAL CARE IF:   Your pain gets worse.  Yourbloating becomes very bad.  You have a fever or chills, and your symptoms suddenly get worse.  You begin vomiting.  You have bowel movements that are bloody or black. MAKE SURE YOU:  Understand these instructions.  Will watch your condition.  Will get help right away if you are not doing well or get worse.   This information is not intended to replace advice given to you by your health care provider. Make sure you discuss any questions you have with your health care provider.   Document Released: 09/15/2003 Document Revised: 12/23/2012 Document Reviewed: 11/12/2012 Elsevier Interactive Patient Education [employer].

## 2015-03-16 NOTE — Progress Notes (Signed)
Patient presents to clinic today for ER follow-up of nausea from antibiotics for acute diverticulitis. Patient was diagnosed with diverticulitis on 03/04/15.  Was started on Cipro and Flagyl which helped to resolve symptoms but made patient very nauseas to the point she could not keep down liquids. ER assessment unremarkable overall. No repeat imaging was felt to be necessary. Diverticular symptoms had resolved and patient responded well to zofran and IV fluids. Of note renal function mildly decreased from baseline. Antibiotics were discontinued and patient giving strict return and follow-up precautions.  Since ER visit patient endorses doing very well.  Denies any residual nausea, vomiting, abdominal pain, fever, chills, etc. Endorses good bowel output, good hydration and adequate nutrition.  Denies tenesmus, melena or hematochezia.   Past Medical History  Diagnosis Date  . Paroxysmal a-fib ([place])   . Mitral and aortic regurgitation   . Mitral valve prolapse     hx of  . Hypertension   . Hyperlipidemia   . Vitamin D deficiency   . Pneumonia   . Tracheitis   . Gait difficulty   . Anemia   . Melena   . IBS (irritable bowel syndrome)   . Incontinence   . GERD (gastroesophageal reflux disease)   . Osteopenia   . Thyroid disease     hypo  . Personal history of colonic polyps   . Current use of long term anticoagulation   . Cough   . Diarrhea   . Pneumonia     organism unspecified  . Constipation   . UTI (lower urinary tract infection)   . Unspecified adverse effect of other drug, medicinal and biological substance(995.29)   . History of mammogram 2009  . Diverticulosis   . Hyperlipidemia   . Atrial fibrillation ([place])   . Arthritis     hips, knees  . Headache(784.0) 06/22/2012  . Renal insufficiency 06/28/2012  . Hyperkalemia 06/28/2012  . Anxiety   . Hypertension   . Medicare annual wellness visit, subsequent 02/05/2014  . Dysrhythmia     a-fib  . Diverticulitis of colon  02/06/2015    Current Outpatient Prescriptions on File Prior to Visit  Medication Sig Dispense Refill  . ALPRAZolam (XANAX) 0.25 MG tablet TAKE ONE TABLET BY MOUTH THREE TIMES A DAY AS NEEDED 90 tablet 1  . atorvastatin (LIPITOR) 10 MG tablet TAKE 1 TABLET (10 MG TOTAL) BY MOUTH DAILY AT 6 PM. 90 tablet 2  . Cholecalciferol (VITAMIN D) 2000 UNITS CAPS Take 2,000 Units by mouth every other day.    . metoprolol tartrate (LOPRESSOR) 25 MG tablet Take 12.5 mg by mouth 2 (two) times daily.    . pantoprazole (PROTONIX) 40 MG tablet TAKE 1 TABLET (40 MG TOTAL) BY MOUTH DAILY. 90 tablet 3  . ramipril (ALTACE) 10 MG capsule TAKE 1 CAPSULE (10 MG TOTAL) BY MOUTH DAILY. 90 capsule 3  . ranitidine (ZANTAC) 150 MG tablet Take 1 tablet (150 mg total) by mouth 2 (two) times daily. 30 tablet 3  . vitamin B-12 (CYANOCOBALAMIN) 1000 MCG tablet Take 1 tablet (1,000 mcg total) by mouth daily.    Marland Kitchen warfarin (COUMADIN) 2.5 MG tablet TAKE AS DIRECTED BY ANTICOAGULATION CLINIC 40 tablet 3  . zoster vaccine live, PF, (ZOSTAVAX) 89211 UNT/0.65ML injection Inject 19,400 Units into the skin once. 1 each 0  . ondansetron (ZOFRAN) 8 MG tablet Take 1 tablet (8 mg total) by mouth every 8 (eight) hours as needed for nausea or vomiting. (Patient not taking: Reported on 03/16/2015)  30 tablet 0   No current facility-administered medications on file prior to visit.    Allergies  Allergen Reactions  . Darifenacin Hydrobromide     REACTION: causes her dizziness  . Sulfonamide Derivatives   . Tramadol Other (See Comments)    Insomnia, anorexia    Family History  Problem Relation Age of Onset  . Diabetes Mother   . CVA Mother   . COPD Father   . Rheum arthritis Father   . Heart disease Maternal Aunt   . Heart disease Maternal Uncle   . Allergies Daughter   . COPD Daughter   . Stroke Daughter   . COPD Daughter     previous smoker  . Stroke Maternal Grandfather   . Colon cancer Neg Hx   . Colon polyps Neg Hx   .  Esophageal cancer Neg Hx   . Gallbladder disease Neg Hx   . Kidney disease Neg Hx   . Stroke Mother   . Heart attack Neg Hx   . Hypertension Neg Hx     Social History   Social History  . Marital Status: Widowed    Spouse Name: N/A  . Number of Children: 5  . Years of Education: N/A   Occupational History  . retired    Social History Main Topics  . Smoking status: Never Smoker   . Smokeless tobacco: Never Used  . Alcohol Use: No  . Drug Use: No  . Sexual Activity: No     Comment: lives with Daughter, grandson and his family. no dietary restricitons.   Other Topics Concern  . None   Social History Narrative   Review of Systems - See HPI.  All other ROS are negative.  BP 140/70 mmHg  Pulse 51  Temp(Src) 97.7 F (36.5 C) (Oral)  Ht 4' 11"  (1.499 m)  Wt 104 lb 3.2 oz (47.265 kg)  BMI 21.03 kg/m2  SpO2 97%  Physical Exam  Constitutional: She is oriented to person, place, and time and well-developed, well-nourished, and in no distress.  HENT:  Head: Normocephalic and atraumatic.  Eyes: Conjunctivae are normal.  Neck: Neck supple.  Cardiovascular: Normal rate, regular rhythm, normal heart sounds and intact distal pulses.   Pulmonary/Chest: Effort normal and breath sounds normal. No respiratory distress. She has no wheezes. She has no rales. She exhibits no tenderness.  Abdominal: Soft. Bowel sounds are normal. She exhibits no distension and no mass. There is no tenderness. There is no rebound and no guarding.  Neurological: She is alert and oriented to person, place, and time.  Skin: Skin is warm and dry. No rash noted.  Psychiatric: Affect normal.  Vitals reviewed.   Recent Results (from the past 2160 hour(s))  POCT INR     Status: None   Collection Time: 01/14/15 11:30 AM  Result Value Ref Range   INR 2.3   Urinalysis, Routine w reflex microscopic (not at [facility])     Status: Abnormal   Collection Time: 01/26/15 10:00 PM  Result Value Ref Range   Color, Urine  YELLOW YELLOW   APPearance CLOUDY (A) CLEAR   Specific Gravity, Urine 1.027 1.005 - 1.030   pH 5.5 5.0 - 8.0   Glucose, UA NEGATIVE NEGATIVE mg/dL   Hgb urine dipstick NEGATIVE NEGATIVE   Bilirubin Urine SMALL (A) NEGATIVE   Ketones, ur NEGATIVE NEGATIVE mg/dL   Protein, ur NEGATIVE NEGATIVE mg/dL   Nitrite NEGATIVE NEGATIVE   Leukocytes, UA MODERATE (A) NEGATIVE  Urine microscopic-add on  Status: Abnormal   Collection Time: 01/26/15 10:00 PM  Result Value Ref Range   Squamous Epithelial / LPF 6-30 (A) NONE SEEN   WBC, UA 6-30 0 - 5 WBC/hpf   RBC / HPF 0-5 0 - 5 RBC/hpf   Bacteria, UA FEW (A) NONE SEEN  CBC with Differential     Status: Abnormal   Collection Time: 01/26/15 10:33 PM  Result Value Ref Range   WBC 9.1 4.0 - 10.5 K/uL   RBC 3.85 (L) 3.87 - 5.11 MIL/uL   Hemoglobin 11.4 (L) 12.0 - 15.0 g/dL   HCT 35.5 (L) 36.0 - 46.0 %   MCV 92.2 78.0 - 100.0 fL   MCH 29.6 26.0 - 34.0 pg   MCHC 32.1 30.0 - 36.0 g/dL   RDW 14.2 11.5 - 15.5 %   Platelets 160 150 - 400 K/uL   Neutrophils Relative % 73 %   Neutro Abs 6.6 1.7 - 7.7 K/uL   Lymphocytes Relative 19 %   Lymphs Abs 1.7 0.7 - 4.0 K/uL   Monocytes Relative 8 %   Monocytes Absolute 0.7 0.1 - 1.0 K/uL   Eosinophils Relative 0 %   Eosinophils Absolute 0.0 0.0 - 0.7 K/uL   Basophils Relative 0 %   Basophils Absolute 0.0 0.0 - 0.1 K/uL  Comprehensive metabolic panel     Status: Abnormal   Collection Time: 01/26/15 10:33 PM  Result Value Ref Range   Sodium 141 135 - 145 mmol/L   Potassium 3.7 3.5 - 5.1 mmol/L   Chloride 106 101 - 111 mmol/L   CO2 26 22 - 32 mmol/L   Glucose, Bld 122 (H) 65 - 99 mg/dL   BUN 16 6 - 20 mg/dL   Creatinine, Ser 1.06 (H) 0.44 - 1.00 mg/dL   Calcium 8.6 (L) 8.9 - 10.3 mg/dL   Total Protein 6.5 6.5 - 8.1 g/dL   Albumin 3.4 (L) 3.5 - 5.0 g/dL   AST 19 15 - 41 U/L   ALT 10 (L) 14 - 54 U/L   Alkaline Phosphatase 15 (L) 38 - 126 U/L   Total Bilirubin 0.6 0.3 - 1.2 mg/dL   GFR calc non Af  Amer 45 (L) >60 mL/min   GFR calc Af Amer 52 (L) >60 mL/min    Comment: (NOTE) The eGFR has been calculated using the CKD EPI equation. This calculation has not been validated in all clinical situations. eGFR's persistently <60 mL/min signify possible Chronic Kidney Disease.    Anion gap 9 5 - 15  Lipase, blood     Status: None   Collection Time: 01/26/15 10:33 PM  Result Value Ref Range   Lipase 20 11 - 51 U/L  Protime-INR     Status: Abnormal   Collection Time: 01/26/15 10:33 PM  Result Value Ref Range   Prothrombin Time 27.0 (H) 11.6 - 15.2 seconds   INR 2.54 (H) 0.00 - 1.49  Comprehensive metabolic panel     Status: Abnormal   Collection Time: 02/01/15  8:56 AM  Result Value Ref Range   Sodium 142 135 - 145 mEq/L   Potassium 3.8 3.5 - 5.1 mEq/L   Chloride 106 96 - 112 mEq/L   CO2 28 19 - 32 mEq/L   Glucose, Bld 99 70 - 99 mg/dL   BUN 14 6 - 23 mg/dL   Creatinine, Ser 1.16 0.40 - 1.20 mg/dL   Total Bilirubin 0.4 0.2 - 1.2 mg/dL   Alkaline Phosphatase 10 (L) 39 - 117  U/L   AST 19 0 - 37 U/L   ALT 11 0 - 35 U/L   Total Protein 6.9 6.0 - 8.3 g/dL   Albumin 3.7 3.5 - 5.2 g/dL   Calcium 9.1 8.4 - 10.5 mg/dL   GFR 46.75 (L) >60.00 mL/min  Urinalysis     Status: Abnormal   Collection Time: 02/01/15  8:56 AM  Result Value Ref Range   Color, Urine YELLOW Yellow;Lt. Yellow   APPearance CLEAR Clear   Specific Gravity, Urine >=1.030 (A) 1.000 - 1.030   pH 5.0 5.0 - 8.0   Total Protein, Urine NEGATIVE Negative   Urine Glucose NEGATIVE Negative   Ketones, ur NEGATIVE Negative   Bilirubin Urine NEGATIVE Negative   Hgb urine dipstick TRACE-INTACT (A) Negative   Urobilinogen, UA 0.2 0.0 - 1.0   Leukocytes, UA NEGATIVE Negative   Nitrite NEGATIVE Negative  Urine culture     Status: None   Collection Time: 02/01/15  8:56 AM  Result Value Ref Range   Colony Count NO GROWTH    Organism ID, Bacteria NO GROWTH   CBC w/Diff     Status: Abnormal   Collection Time: 02/01/15  8:56 AM   Result Value Ref Range   WBC 5.0 4.0 - 10.5 K/uL   RBC 3.94 3.87 - 5.11 Mil/uL   Hemoglobin 11.8 (L) 12.0 - 15.0 g/dL   HCT 35.7 (L) 36.0 - 46.0 %   MCV 90.7 78.0 - 100.0 fl   MCHC 32.9 30.0 - 36.0 g/dL   RDW 14.8 11.5 - 15.5 %   Platelets 190.0 150.0 - 400.0 K/uL   Neutrophils Relative % 67.9 43.0 - 77.0 %   Lymphocytes Relative 25.1 12.0 - 46.0 %   Monocytes Relative 5.9 3.0 - 12.0 %   Eosinophils Relative 0.5 0.0 - 5.0 %   Basophils Relative 0.6 0.0 - 3.0 %   Neutro Abs 3.4 1.4 - 7.7 K/uL   Lymphs Abs 1.3 0.7 - 4.0 K/uL   Monocytes Absolute 0.3 0.1 - 1.0 K/uL   Eosinophils Absolute 0.0 0.0 - 0.7 K/uL   Basophils Absolute 0.0 0.0 - 0.1 K/uL  TSH     Status: Abnormal   Collection Time: 02/01/15  8:56 AM  Result Value Ref Range   TSH 4.88 (H) 0.35 - 4.50 uIU/mL  Lipid panel     Status: Abnormal   Collection Time: 02/01/15  8:56 AM  Result Value Ref Range   Cholesterol 80 0 - 200 mg/dL    Comment: ATP III Classification       Desirable:  < 200 mg/dL               Borderline High:  200 - 239 mg/dL          High:  > = 240 mg/dL   Triglycerides 75.0 0.0 - 149.0 mg/dL    Comment: Normal:  <150 mg/dLBorderline High:  150 - 199 mg/dL   HDL 37.30 (L) >39.00 mg/dL   VLDL 15.0 0.0 - 40.0 mg/dL   LDL Cholesterol 28 0 - 99 mg/dL   Total CHOL/HDL Ratio 2     Comment:                Men          Women1/2 Average Risk     3.4          3.3Average Risk          5.0  4.42X Average Risk          9.6          7.13X Average Risk          15.0          11.0                       NonHDL 42.68     Comment: NOTE:  Non-HDL goal should be 30 mg/dL higher than patient's LDL goal (i.e. LDL goal of < 70 mg/dL, would have non-HDL goal of < 100 mg/dL)  Hemoglobin A1c     Status: None   Collection Time: 02/01/15  8:56 AM  Result Value Ref Range   Hgb A1c MFr Bld 5.9 4.6 - 6.5 %    Comment: Glycemic Control Guidelines for People with Diabetes:Non Diabetic:  <6%Goal of Therapy: <7%Additional Action  Suggested:  >8%   POCT INR     Status: None   Collection Time: 02/01/15  2:52 PM  Result Value Ref Range   INR 3.4   T4, free     Status: None   Collection Time: 02/02/15 10:02 AM  Result Value Ref Range   Free T4 0.96 0.60 - 1.60 ng/dL  Fecal occult blood, imunochemical     Status: None   Collection Time: 02/18/15  8:16 AM  Result Value Ref Range   Fecal Occult Bld Negative Negative  POCT INR     Status: None   Collection Time: 02/18/15 11:03 AM  Result Value Ref Range   INR 2.2   Urinalysis, Routine w reflex microscopic (not at [facility])     Status: Abnormal   Collection Time: 03/05/15  4:50 PM  Result Value Ref Range   Color, Urine YELLOW YELLOW   APPearance CLOUDY (A) CLEAR   Specific Gravity, Urine 1.017 1.005 - 1.030   pH 7.5 5.0 - 8.0   Glucose, UA NEGATIVE NEGATIVE mg/dL   Hgb urine dipstick NEGATIVE NEGATIVE   Bilirubin Urine NEGATIVE NEGATIVE   Ketones, ur NEGATIVE NEGATIVE mg/dL   Protein, ur NEGATIVE NEGATIVE mg/dL   Nitrite NEGATIVE NEGATIVE   Leukocytes, UA NEGATIVE NEGATIVE    Comment: MICROSCOPIC NOT DONE ON URINES WITH NEGATIVE PROTEIN, BLOOD, LEUKOCYTES, NITRITE, OR GLUCOSE <1000 mg/dL.  Basic metabolic panel     Status: Abnormal   Collection Time: 03/05/15  5:00 PM  Result Value Ref Range   Sodium 139 135 - 145 mmol/L   Potassium 3.7 3.5 - 5.1 mmol/L   Chloride 102 101 - 111 mmol/L   CO2 28 22 - 32 mmol/L   Glucose, Bld 107 (H) 65 - 99 mg/dL   BUN 15 6 - 20 mg/dL   Creatinine, Ser 1.05 (H) 0.44 - 1.00 mg/dL   Calcium 9.1 8.9 - 10.3 mg/dL   GFR calc non Af Amer 46 (L) >60 mL/min   GFR calc Af Amer 53 (L) >60 mL/min    Comment: (NOTE) The eGFR has been calculated using the CKD EPI equation. This calculation has not been validated in all clinical situations. eGFR's persistently <60 mL/min signify possible Chronic Kidney Disease.    Anion gap 9 5 - 15  CBC with Differential     Status: Abnormal   Collection Time: 03/05/15  5:00 PM  Result Value Ref  Range   WBC 9.6 4.0 - 10.5 K/uL   RBC 3.96 3.87 - 5.11 MIL/uL   Hemoglobin 12.1 12.0 - 15.0 g/dL   HCT 36.1 36.0 -  46.0 %   MCV 91.2 78.0 - 100.0 fL   MCH 30.6 26.0 - 34.0 pg   MCHC 33.5 30.0 - 36.0 g/dL   RDW 14.9 11.5 - 15.5 %   Platelets 158 150 - 400 K/uL   Neutrophils Relative % 85 %   Neutro Abs 8.1 (H) 1.7 - 7.7 K/uL   Lymphocytes Relative 9 %   Lymphs Abs 0.9 0.7 - 4.0 K/uL   Monocytes Relative 6 %   Monocytes Absolute 0.6 0.1 - 1.0 K/uL   Eosinophils Relative 0 %   Eosinophils Absolute 0.0 0.0 - 0.7 K/uL   Basophils Relative 0 %   Basophils Absolute 0.0 0.0 - 0.1 K/uL  Protime-INR     Status: Abnormal   Collection Time: 03/05/15  5:00 PM  Result Value Ref Range   Prothrombin Time 25.6 (H) 11.6 - 15.2 seconds   INR 2.37 (H) 0.00 - 1.49  POCT INR     Status: None   Collection Time: 03/08/15 11:23 AM  Result Value Ref Range   INR 3.3   CBC with Differential/Platelet     Status: None   Collection Time: 03/10/15 11:50 PM  Result Value Ref Range   WBC 6.0 4.0 - 10.5 K/uL   RBC 4.10 3.87 - 5.11 MIL/uL   Hemoglobin 12.5 12.0 - 15.0 g/dL   HCT 37.6 36.0 - 46.0 %   MCV 91.7 78.0 - 100.0 fL   MCH 30.5 26.0 - 34.0 pg   MCHC 33.2 30.0 - 36.0 g/dL   RDW 14.8 11.5 - 15.5 %   Platelets 177 150 - 400 K/uL   Neutrophils Relative % 74 %   Neutro Abs 4.5 1.7 - 7.7 K/uL   Lymphocytes Relative 16 %   Lymphs Abs 1.0 0.7 - 4.0 K/uL   Monocytes Relative 9 %   Monocytes Absolute 0.5 0.1 - 1.0 K/uL   Eosinophils Relative 1 %   Eosinophils Absolute 0.0 0.0 - 0.7 K/uL   Basophils Relative 0 %   Basophils Absolute 0.0 0.0 - 0.1 K/uL  Comprehensive metabolic panel     Status: Abnormal   Collection Time: 03/10/15 11:50 PM  Result Value Ref Range   Sodium 138 135 - 145 mmol/L   Potassium 3.5 3.5 - 5.1 mmol/L   Chloride 101 101 - 111 mmol/L   CO2 26 22 - 32 mmol/L   Glucose, Bld 126 (H) 65 - 99 mg/dL   BUN 22 (H) 6 - 20 mg/dL   Creatinine, Ser 1.65 (H) 0.44 - 1.00 mg/dL   Calcium  8.8 (L) 8.9 - 10.3 mg/dL   Total Protein 7.3 6.5 - 8.1 g/dL   Albumin 3.8 3.5 - 5.0 g/dL   AST 14 (L) 15 - 41 U/L   ALT 10 (L) 14 - 54 U/L   Alkaline Phosphatase 12 (L) 38 - 126 U/L   Total Bilirubin 0.4 0.3 - 1.2 mg/dL   GFR calc non Af Amer 26 (L) >60 mL/min   GFR calc Af Amer 31 (L) >60 mL/min    Comment: (NOTE) The eGFR has been calculated using the CKD EPI equation. This calculation has not been validated in all clinical situations. eGFR's persistently <60 mL/min signify possible Chronic Kidney Disease.    Anion gap 11 5 - 15  Lipase, blood     Status: None   Collection Time: 03/10/15 11:50 PM  Result Value Ref Range   Lipase 20 11 - 51 U/L  Urinalysis, Routine w reflex microscopic (  not at [facility])     Status: None   Collection Time: 03/11/15  1:13 AM  Result Value Ref Range   Color, Urine YELLOW YELLOW   APPearance CLEAR CLEAR   Specific Gravity, Urine 1.012 1.005 - 1.030   pH 5.0 5.0 - 8.0   Glucose, UA NEGATIVE NEGATIVE mg/dL   Hgb urine dipstick NEGATIVE NEGATIVE   Bilirubin Urine NEGATIVE NEGATIVE   Ketones, ur NEGATIVE NEGATIVE mg/dL   Protein, ur NEGATIVE NEGATIVE mg/dL   Nitrite NEGATIVE NEGATIVE   Leukocytes, UA NEGATIVE NEGATIVE    Comment: MICROSCOPIC NOT DONE ON URINES WITH NEGATIVE PROTEIN, BLOOD, LEUKOCYTES, NITRITE, OR GLUCOSE <1000 mg/dL.  POCT INR     Status: None   Collection Time: 03/11/15 11:51 AM  Result Value Ref Range   INR 4.9     Assessment/Plan: Renal insufficiency Mild elevation in BUN and Creatinine (1.65) in ER. Will repeat BMP today now that patient is off of antibiotics and eating/hydrating well.  Diverticulitis of colon Clinically resolved. CBC in ER within normal limits. No recurrence of symptoms after stopping antibiotic. Supportive and dietary measures reviewed. Follow-up with Dr. Charlett Blake as scheduled.  Return immediately or go to ER if anything recurs

## 2015-03-17 ENCOUNTER — Other Ambulatory Visit: Payer: Self-pay | Admitting: Family Medicine

## 2015-03-17 ENCOUNTER — Other Ambulatory Visit: Payer: Self-pay | Admitting: Cardiology

## 2015-03-17 DIAGNOSIS — R7989 Other specified abnormal findings of blood chemistry: Secondary | ICD-10-CM

## 2015-03-18 ENCOUNTER — Ambulatory Visit (INDEPENDENT_AMBULATORY_CARE_PROVIDER_SITE_OTHER): Payer: Medicare Other | Admitting: Pharmacist

## 2015-03-18 DIAGNOSIS — I4891 Unspecified atrial fibrillation: Secondary | ICD-10-CM | POA: Diagnosis not present

## 2015-03-18 DIAGNOSIS — Z5181 Encounter for therapeutic drug level monitoring: Secondary | ICD-10-CM

## 2015-03-18 DIAGNOSIS — Z7901 Long term (current) use of anticoagulants: Secondary | ICD-10-CM | POA: Diagnosis not present

## 2015-03-18 LAB — POCT INR: INR: 2.2

## 2015-03-21 ENCOUNTER — Telehealth: Payer: Self-pay | Admitting: Gastroenterology

## 2015-03-21 MED ORDER — AMOXICILLIN-POT CLAVULANATE 875-125 MG PO TABS: 1.0000 | ORAL_TABLET | Freq: Two times a day (BID) | ORAL | Status: DC

## 2015-03-21 NOTE — Telephone Encounter (Signed)
Pt has a history of diverticulitis and has started having lower left side abd pain since she woke up this morning.  Constipation with small hard stool, last bowel movement this morning.  She states this pain is the same as when she had diverticulitis in the past.  She has not taken any thing for the pain.   Please advise

## 2015-03-21 NOTE — Telephone Encounter (Signed)
Dr Loletha Carrow is off this week, I spoke with Dr Hilarie Fredrickson the Doc of the day and he recommended cipro and flagyl but the pt states she gets sick when she takes that combination.  I spoke again with Dr Hilarie Fredrickson and Augmentin 875 twice daily for 7 days was recommended.  She will also begin miralax for her constipation.  The pt has been made aware of the prescription Augmentin and to begin OTC miralax.  She was advised to call back if no better by Friday.

## 2015-03-24 ENCOUNTER — Telehealth: Payer: Self-pay | Admitting: Family Medicine

## 2015-03-24 NOTE — Telephone Encounter (Signed)
Need to know how long has she been sick and any fevers? Am likely to allow Doxycyline 100 mg po bid x 7 days

## 2015-03-24 NOTE — Telephone Encounter (Signed)
Pharmacy: [id number] - [place], [place] - [address] AT [address]  Reason for call: pt has a head cold with dry cough, runny nose, and sneezing. She is wanting something called in for her. She stated she cannot come in for appt. Advised likely would not call in RX but may recommend OTC meds.

## 2015-03-25 MED ORDER — DOXYCYCLINE HYCLATE 100 MG PO TABS: 100.0000 mg | ORAL_TABLET | Freq: Two times a day (BID) | ORAL | Status: DC

## 2015-03-25 NOTE — Telephone Encounter (Signed)
Spoke to the patient and she has been sick since Monday.  Has had no fevers, a little cough and congestion.  Just wants to know OTC to take as already on amoxicillin from [facility] for diverticulitis OTC suggestions please.

## 2015-03-25 NOTE — Telephone Encounter (Signed)
Mucinex twice daily, Elderberry caps, Vitamin C 500 mg daily. Drink plenty of fluids and come in if not improving.

## 2015-03-25 NOTE — Telephone Encounter (Signed)
Spoke to the patient informed of PCP instructions.

## 2015-03-26 NOTE — Telephone Encounter (Signed)
Patty,    I think this patient has had smoldering diverticulitis for the last 2 months. Therefore, please change this to a 14 day course of Augmentin, and have her start some Align once a day at bedtime to decrease the chance of diarrhea and C. Difficile. Please call today to see how she is doing.  If the augmentin is causing diarrhea (which is common), she does not need the miralax while on the antibiotics.

## 2015-03-28 MED ORDER — AMOXICILLIN-POT CLAVULANATE 875-125 MG PO TABS: 1.0000 | ORAL_TABLET | Freq: Two times a day (BID) | ORAL | Status: DC

## 2015-03-28 NOTE — Telephone Encounter (Signed)
The pt is aware of the instructions and will pick up the abx today.  She will also begin align at bedtime.  She will stop the miralax if she begins to have diarrhea

## 2015-04-01 ENCOUNTER — Other Ambulatory Visit (INDEPENDENT_AMBULATORY_CARE_PROVIDER_SITE_OTHER): Payer: Medicare Other

## 2015-04-01 ENCOUNTER — Ambulatory Visit (INDEPENDENT_AMBULATORY_CARE_PROVIDER_SITE_OTHER): Payer: Medicare Other | Admitting: *Deleted

## 2015-04-01 DIAGNOSIS — R748 Abnormal levels of other serum enzymes: Secondary | ICD-10-CM

## 2015-04-01 DIAGNOSIS — I4891 Unspecified atrial fibrillation: Secondary | ICD-10-CM

## 2015-04-01 DIAGNOSIS — R7989 Other specified abnormal findings of blood chemistry: Secondary | ICD-10-CM

## 2015-04-01 DIAGNOSIS — Z5181 Encounter for therapeutic drug level monitoring: Secondary | ICD-10-CM | POA: Diagnosis not present

## 2015-04-01 DIAGNOSIS — Z7901 Long term (current) use of anticoagulants: Secondary | ICD-10-CM

## 2015-04-01 LAB — BASIC METABOLIC PANEL
BUN: 18 mg/dL (ref 6–23)
CALCIUM: 9.5 mg/dL (ref 8.4–10.5)
CO2: 30 mEq/L (ref 19–32)
CREATININE: 1.11 mg/dL (ref 0.40–1.20)
Chloride: 103 mEq/L (ref 96–112)
GFR: 49.17 mL/min — AB (ref >60.00)
Glucose, Bld: 102 mg/dL — ABNORMAL HIGH (ref 70–99)
Potassium: 3.9 mEq/L (ref 3.5–5.1)
Sodium: 141 mEq/L (ref 135–145)

## 2015-04-01 LAB — POCT INR: INR: 2

## 2015-04-03 ENCOUNTER — Other Ambulatory Visit: Payer: Self-pay | Admitting: Family Medicine

## 2015-04-07 ENCOUNTER — Other Ambulatory Visit: Payer: Self-pay | Admitting: Family Medicine

## 2015-04-07 NOTE — Telephone Encounter (Signed)
Faxed hardcopy for [facility] to [facility] in [place]

## 2015-04-15 ENCOUNTER — Ambulatory Visit (INDEPENDENT_AMBULATORY_CARE_PROVIDER_SITE_OTHER): Payer: Medicare Other | Admitting: *Deleted

## 2015-04-15 DIAGNOSIS — Z5181 Encounter for therapeutic drug level monitoring: Secondary | ICD-10-CM

## 2015-04-15 DIAGNOSIS — Z7901 Long term (current) use of anticoagulants: Secondary | ICD-10-CM

## 2015-04-15 DIAGNOSIS — I4891 Unspecified atrial fibrillation: Secondary | ICD-10-CM | POA: Diagnosis not present

## 2015-04-15 LAB — POCT INR: INR: 2.7

## 2015-05-13 ENCOUNTER — Ambulatory Visit (INDEPENDENT_AMBULATORY_CARE_PROVIDER_SITE_OTHER): Payer: Medicare Other | Admitting: Surgery

## 2015-05-13 DIAGNOSIS — Z7901 Long term (current) use of anticoagulants: Secondary | ICD-10-CM

## 2015-05-13 DIAGNOSIS — Z5181 Encounter for therapeutic drug level monitoring: Secondary | ICD-10-CM | POA: Diagnosis not present

## 2015-05-13 DIAGNOSIS — I4891 Unspecified atrial fibrillation: Secondary | ICD-10-CM | POA: Diagnosis not present

## 2015-05-13 LAB — POCT INR: INR: 2.5

## 2015-05-27 ENCOUNTER — Encounter: Payer: Self-pay | Admitting: Family Medicine

## 2015-05-27 ENCOUNTER — Ambulatory Visit (INDEPENDENT_AMBULATORY_CARE_PROVIDER_SITE_OTHER): Payer: Medicare Other | Admitting: Family Medicine

## 2015-05-27 VITALS — BP 142/70 | HR 63 | Temp 97.9°F | Ht 59.0 in | Wt 102.2 lb

## 2015-05-27 DIAGNOSIS — I10 Essential (primary) hypertension: Secondary | ICD-10-CM

## 2015-05-27 DIAGNOSIS — R634 Abnormal weight loss: Secondary | ICD-10-CM

## 2015-05-27 DIAGNOSIS — E782 Mixed hyperlipidemia: Secondary | ICD-10-CM | POA: Diagnosis not present

## 2015-05-27 DIAGNOSIS — R739 Hyperglycemia, unspecified: Secondary | ICD-10-CM

## 2015-05-27 DIAGNOSIS — R001 Bradycardia, unspecified: Secondary | ICD-10-CM

## 2015-05-27 DIAGNOSIS — K219 Gastro-esophageal reflux disease without esophagitis: Secondary | ICD-10-CM

## 2015-05-27 NOTE — Patient Instructions (Signed)
Benefiber powder twice daily in liquid NOW probiotic 1 cap daily at [place] 64 oz of fluids daily and protein with each meals   Diverticulitis Diverticulitis is inflammation or infection of small pouches in your colon that form when you have a condition called diverticulosis. The pouches in your colon are called diverticula. Your colon, or large intestine, is where water is absorbed and stool is formed. Complications of diverticulitis can include:  Bleeding.  Severe infection.  Severe pain.  Perforation of your colon.  Obstruction of your colon. CAUSES  Diverticulitis is caused by bacteria. Diverticulitis happens when stool becomes trapped in diverticula. This allows bacteria to grow in the diverticula, which can lead to inflammation and infection. RISK FACTORS People with diverticulosis are at risk for diverticulitis. Eating a diet that does not include enough fiber from fruits and vegetables may make diverticulitis more likely to develop. SYMPTOMS  Symptoms of diverticulitis may include:  Abdominal pain and tenderness. The pain is normally located on the left side of the abdomen, but may occur in other areas.  Fever and chills.  Bloating.  Cramping.  Nausea.  Vomiting.  Constipation.  Diarrhea.  Blood in your stool. DIAGNOSIS  Your health care provider will ask you about your medical history and do a physical exam. You may need to have tests done because many medical conditions can cause the same symptoms as diverticulitis. Tests may include:  Blood tests.  Urine tests.  Imaging tests of the abdomen, including X-rays and CT scans. When your condition is under control, your health care provider may recommend that you have a colonoscopy. A colonoscopy can show how severe your diverticula are and whether something else is causing your symptoms. TREATMENT  Most cases of diverticulitis are mild and can be treated at home. Treatment may include:  Taking  over-the-counter pain medicines.  Following a clear liquid diet.  Taking antibiotic medicines by mouth for 7-10 days. More severe cases may be treated at a hospital. Treatment may include:  Not eating or drinking.  Taking prescription pain medicine.  Receiving antibiotic medicines through an IV tube.  Receiving fluids and nutrition through an IV tube.  Surgery. HOME CARE INSTRUCTIONS   Follow your health care provider's instructions carefully.  Follow a full liquid diet or other diet as directed by your health care provider. After your symptoms improve, your health care provider may tell you to change your diet. He or she may recommend you eat a high-fiber diet. Fruits and vegetables are good sources of fiber. Fiber makes it easier to pass stool.  Take fiber supplements or probiotics as directed by your health care provider.  Only take medicines as directed by your health care provider.  Keep all your follow-up appointments. SEEK MEDICAL CARE IF:   Your pain does not improve.  You have a hard time eating food.  Your bowel movements do not return to normal. SEEK IMMEDIATE MEDICAL CARE IF:   Your pain becomes worse.  Your symptoms do not get better.  Your symptoms suddenly get worse.  You have a fever.  You have repeated vomiting.  You have bloody or black, tarry stools. MAKE SURE YOU:   Understand these instructions.  Will watch your condition.  Will get help right away if you are not doing well or get worse.   This information is not intended to replace advice given to you by your health care provider. Make sure you discuss any questions you have with your health care provider.  Document Released: 09/27/2004 Document Revised: 12/23/2012 Document Reviewed: 11/12/2012 Elsevier Interactive Patient Education [employer].

## 2015-05-27 NOTE — Progress Notes (Signed)
Pre visit review using our clinic review tool, if applicable. No additional management support is needed unless otherwise documented below in the visit note. 

## 2015-06-05 ENCOUNTER — Encounter: Payer: Self-pay | Admitting: Family Medicine

## 2015-06-05 DIAGNOSIS — R634 Abnormal weight loss: Secondary | ICD-10-CM

## 2015-06-05 HISTORY — DX: Abnormal weight loss: R63.4

## 2015-06-05 NOTE — Progress Notes (Signed)
Patient ID: [name] [name], female   DOB: 06-15-1924, 82 y.o.   MRN: [id number]   Subjective:    Patient ID: [name] [name], female    DOB: [birth year], 82 y.o.   MRN: [id number]  Chief Complaint  Patient presents with  . Follow-up    HPI Patient is in today for follow up. She feels well today but she is noting some weight loss and decreased appetite since her flare up of diverticulitis. No nausea or diarrhea at this time. No fevers. She notes her blood pressure has been running from 110 to 130s at home. Encouraged increased hydration, 64 ounces of clear fluids daily. Minimize alcohol and caffeine. Eat small frequent meals with lean proteins and complex carbs. Avoid high and low blood sugars. Get adequate sleep, 7-8 hours a night. Needs exercise daily preferably in the morning.  Past Medical History  Diagnosis Date  . Paroxysmal a-fib ([place])   . Mitral and aortic regurgitation   . Mitral valve prolapse     hx of  . Hypertension   . Hyperlipidemia   . Vitamin D deficiency   . Pneumonia   . Tracheitis   . Gait difficulty   . Anemia   . Melena   . IBS (irritable bowel syndrome)   . Incontinence   . GERD (gastroesophageal reflux disease)   . Osteopenia   . Thyroid disease     hypo  . Personal history of colonic polyps   . Current use of long term anticoagulation   . Cough   . Diarrhea   . Pneumonia     organism unspecified  . Constipation   . UTI (lower urinary tract infection)   . Unspecified adverse effect of other drug, medicinal and biological substance(995.29)   . History of mammogram 2009  . Diverticulosis   . Hyperlipidemia   . Atrial fibrillation ([place])   . Arthritis     hips, knees  . Headache(784.0) 06/22/2012  . Renal insufficiency 06/28/2012  . Hyperkalemia 06/28/2012  . Anxiety   . Hypertension   . Medicare annual wellness visit, subsequent 02/05/2014  . Dysrhythmia     a-fib  . Diverticulitis of colon 02/06/2015  . Loss of weight 06/05/2015    Past Surgical  History  Procedure Laterality Date  . Breast biopsy Left   . Cardiac electrophysiology mapping and ablation    . Partial hysterectomy      ovaries left in place  . Cataract extraction, bilateral    . Esophagoscopy w/ botox injection    . Esophagogastroduodenoscopy N/A 09/03/2012    Procedure: ESOPHAGOGASTRODUODENOSCOPY (EGD);  Surgeon: Inda Castle, MD;  Location: [facility];  Service: Endoscopy;  Laterality: N/A;  . Botox injection N/A 09/03/2012    Procedure: BOTOX INJECTION;  Surgeon: Inda Castle, MD;  Location: [facility];  Service: Endoscopy;  Laterality: N/A;  . I&d extremity Right 11/06/2012    Procedure: IRRIGATION AND DEBRIDEMENT EXTREMITY Right Ring Finger;  Surgeon: Tennis Must, MD;  Location: [place];  Service: Orthopedics;  Laterality: Right;  . Breast lumpectomy Right 08/18/2014    Procedure: RIGHT BREAST LUMPECTOMY;  Surgeon: Coralie Keens, MD;  Location: [place];  Service: General;  Laterality: Right;    Family History  Problem Relation Age of Onset  . Diabetes Mother   . CVA Mother   . COPD Father   . Rheum arthritis Father   . Heart disease Maternal Aunt   . Heart disease Maternal Uncle   .  Allergies Daughter   . COPD Daughter   . Stroke Daughter   . COPD Daughter     previous smoker  . Stroke Maternal Grandfather   . Colon cancer Neg Hx   . Colon polyps Neg Hx   . Esophageal cancer Neg Hx   . Gallbladder disease Neg Hx   . Kidney disease Neg Hx   . Stroke Mother   . Heart attack Neg Hx   . Hypertension Neg Hx     Social History   Social History  . Marital Status: Widowed    Spouse Name: N/A  . Number of Children: 5  . Years of Education: N/A   Occupational History  . retired    Social History Main Topics  . Smoking status: Never Smoker   . Smokeless tobacco: Never Used  . Alcohol Use: No  . Drug Use: No  . Sexual Activity: No     Comment: lives with Daughter, grandson and his family. no dietary restricitons.    Other Topics Concern  . Not on file   Social History Narrative    Outpatient Prescriptions Prior to Visit  Medication Sig Dispense Refill  . ALPRAZolam (XANAX) 0.25 MG tablet TAKE 1 TABLET BY MOUTH 3 TIMES A DAY AS NEEDED 90 tablet 1  . atorvastatin (LIPITOR) 10 MG tablet TAKE 1 TABLET (10 MG TOTAL) BY MOUTH DAILY AT 6 PM. 30 tablet 4  . Cholecalciferol (VITAMIN D) 2000 UNITS CAPS Take 2,000 Units by mouth every other day.    Marland Kitchen HYDROcodone-acetaminophen (NORCO/VICODIN) 5-325 MG tablet TAKE ONE TABLET BY MOUTH EVERY 6 HOURS AS NEEDED FOR SEVERE PAIN.  0  . metoprolol tartrate (LOPRESSOR) 25 MG tablet Take 12.5 mg by mouth 2 (two) times daily.    . metoprolol tartrate (LOPRESSOR) 25 MG tablet TAKE 0.5 TABLETS (12.5 MG TOTAL) BY MOUTH 2 TIMES DAILY. 90 tablet 1  . ondansetron (ZOFRAN) 8 MG tablet Take 1 tablet (8 mg total) by mouth every 8 (eight) hours as needed for nausea or vomiting. 30 tablet 0  . pantoprazole (PROTONIX) 40 MG tablet TAKE 1 TABLET (40 MG TOTAL) BY MOUTH DAILY. 90 tablet 3  . ramipril (ALTACE) 10 MG capsule TAKE 1 CAPSULE (10 MG TOTAL) BY MOUTH DAILY. 90 capsule 3  . ranitidine (ZANTAC) 150 MG tablet Take 1 tablet (150 mg total) by mouth 2 (two) times daily. 30 tablet 3  . vitamin B-12 (CYANOCOBALAMIN) 1000 MCG tablet Take 1 tablet (1,000 mcg total) by mouth daily.    Marland Kitchen warfarin (COUMADIN) 2.5 MG tablet TAKE AS DIRECTED BY ANTICOAGULATION CLINIC 40 tablet 3  . zoster vaccine live, PF, (ZOSTAVAX) 60454 UNT/0.65ML injection Inject 19,400 Units into the skin once. 1 each 0  . amoxicillin-clavulanate (AUGMENTIN) 875-125 MG tablet Take 1 tablet by mouth 2 (two) times daily. 14 tablet 0   No facility-administered medications prior to visit.    Allergies  Allergen Reactions  . Darifenacin Hydrobromide     REACTION: causes her dizziness  . Sulfonamide Derivatives   . Tramadol Other (See Comments)    Insomnia, anorexia    Review of Systems  Constitutional: Negative for  fever and malaise/fatigue.  HENT: Negative for congestion.   Eyes: Negative for blurred vision.  Respiratory: Negative for shortness of breath.   Cardiovascular: Negative for chest pain, palpitations and leg swelling.  Gastrointestinal: Negative for nausea, abdominal pain and blood in stool.  Genitourinary: Negative for dysuria and frequency.  Musculoskeletal: Negative for falls.  Skin: Negative for  rash.  Neurological: Negative for dizziness, loss of consciousness and headaches.  Endo/Heme/Allergies: Negative for environmental allergies.  Psychiatric/Behavioral: Negative for depression. The patient is not nervous/anxious.        Objective:    Physical Exam  Constitutional: She is oriented to person, place, and time. She appears well-developed and well-nourished. No distress.  HENT:  Head: Normocephalic and atraumatic.  Eyes: Conjunctivae are normal.  Neck: Neck supple. No thyromegaly present.  Cardiovascular: Normal rate and regular rhythm.   Murmur heard. Pulmonary/Chest: Effort normal and breath sounds normal. No respiratory distress.  Abdominal: Soft. Bowel sounds are normal. She exhibits no distension and no mass. There is no tenderness.  Musculoskeletal: She exhibits no edema.  Lymphadenopathy:    She has no cervical adenopathy.  Neurological: She is alert and oriented to person, place, and time.  Skin: Skin is warm and dry.  Psychiatric: She has a normal mood and affect. Her behavior is normal.    BP 142/70 mmHg  Pulse 63  Temp(Src) 97.9 F (36.6 C) (Oral)  Ht 4\' 11"  (1.499 m)  Wt 102 lb 4 oz (46.38 kg)  BMI 20.64 kg/m2  SpO2 97% Wt Readings from Last 3 Encounters:  05/27/15 102 lb 4 oz (46.38 kg)  03/16/15 104 lb 3.2 oz (47.265 kg)  03/10/15 106 lb (48.081 kg)     Lab Results  Component Value Date   WBC 6.0 03/10/2015   HGB 12.5 03/10/2015   HCT 37.6 03/10/2015   PLT 177 03/10/2015   GLUCOSE 102* 04/01/2015   CHOL 80 02/01/2015   TRIG 75.0 02/01/2015     HDL 37.30* 02/01/2015   LDLDIRECT 72.1 01/19/2009   LDLCALC 28 02/01/2015   ALT 10* 03/10/2015   AST 14* 03/10/2015   NA 141 04/01/2015   K 3.9 04/01/2015   CL 103 04/01/2015   CREATININE 1.11 04/01/2015   BUN 18 04/01/2015   CO2 30 04/01/2015   TSH 4.88* 02/01/2015   INR 2.5 05/13/2015   HGBA1C 5.9 02/01/2015    Lab Results  Component Value Date   TSH 4.88* 02/01/2015   Lab Results  Component Value Date   WBC 6.0 03/10/2015   HGB 12.5 03/10/2015   HCT 37.6 03/10/2015   MCV 91.7 03/10/2015   PLT 177 03/10/2015   Lab Results  Component Value Date   NA 141 04/01/2015   K 3.9 04/01/2015   CO2 30 04/01/2015   GLUCOSE 102* 04/01/2015   BUN 18 04/01/2015   CREATININE 1.11 04/01/2015   BILITOT 0.4 03/10/2015   ALKPHOS 12* 03/10/2015   AST 14* 03/10/2015   ALT 10* 03/10/2015   PROT 7.3 03/10/2015   ALBUMIN 3.8 03/10/2015   CALCIUM 9.5 04/01/2015   ANIONGAP 11 03/10/2015   GFR 49.17* 04/01/2015   Lab Results  Component Value Date   CHOL 80 02/01/2015   Lab Results  Component Value Date   HDL 37.30* 02/01/2015   Lab Results  Component Value Date   LDLCALC 28 02/01/2015   Lab Results  Component Value Date   TRIG 75.0 02/01/2015   Lab Results  Component Value Date   CHOLHDL 2 02/01/2015   Lab Results  Component Value Date   HGBA1C 5.9 02/01/2015       Assessment & Plan:   Problem List Items Addressed This Visit    Sinus bradycardia    RRR today      Loss of weight    S/p an episode of diverticultis. Is feeling better but  appetite is still suppressed. She feels it is improving. Encouraged to increase protein intake and will monitor      Hyperlipidemia, mixed    Tolerating statin, encouraged heart healthy diet, avoid trans fats, minimize simple carbs and saturated fats. Increase exercise as tolerated      Hyperglycemia - Primary    minimize simple carbs. Increase exercise as tolerated.       GERD    Avoid offending foods, start  probiotics. Do not eat large meals in late evening and consider raising head of bed. Continue Pantoprazole      Essential hypertension    Mild elevation today but is noting systolic BP is between [id number] to 130 at home. No changes.         I have discontinued Ms. Stolze's amoxicillin-clavulanate. I am also having her maintain her vitamin B-12, Vitamin D, zoster vaccine live (PF), pantoprazole, metoprolol tartrate, ramipril, ranitidine, ondansetron, HYDROcodone-acetaminophen, atorvastatin, warfarin, metoprolol tartrate, and ALPRAZolam.  No orders of the defined types were placed in this encounter.     Penni Homans, MD

## 2015-06-05 NOTE — Assessment & Plan Note (Signed)
Encouraged to get adequate exercise, calcium and vitamin d intake 

## 2015-06-05 NOTE — Assessment & Plan Note (Signed)
Mild elevation today but is noting systolic BP is between A999333 to 130 at home. No changes.

## 2015-06-05 NOTE — Assessment & Plan Note (Signed)
S/p an episode of diverticultis. Is feeling better but appetite is still suppressed. She feels it is improving. Encouraged to increase protein intake and will monitor

## 2015-06-05 NOTE — Assessment & Plan Note (Addendum)
minimize simple carbs. Increase exercise as tolerated.  

## 2015-06-05 NOTE — Assessment & Plan Note (Signed)
Avoid offending foods, start probiotics. Do not eat large meals in late evening and consider raising head of bed. Continue Pantoprazole  

## 2015-06-05 NOTE — Assessment & Plan Note (Signed)
RRR today 

## 2015-06-05 NOTE — Assessment & Plan Note (Signed)
Tolerating statin, encouraged heart healthy diet, avoid trans fats, minimize simple carbs and saturated fats. Increase exercise as tolerated 

## 2015-06-09 ENCOUNTER — Encounter (HOSPITAL_BASED_OUTPATIENT_CLINIC_OR_DEPARTMENT_OTHER): Payer: Self-pay

## 2015-06-09 ENCOUNTER — Emergency Department (HOSPITAL_BASED_OUTPATIENT_CLINIC_OR_DEPARTMENT_OTHER): Payer: Medicare Other

## 2015-06-09 ENCOUNTER — Emergency Department (HOSPITAL_BASED_OUTPATIENT_CLINIC_OR_DEPARTMENT_OTHER)
Admission: EM | Admit: 2015-06-09 | Discharge: 2015-06-09 | Disposition: A | Discharge status: Home / Self Care | Payer: Medicare Other | Attending: Emergency Medicine | Admitting: Emergency Medicine

## 2015-06-09 DIAGNOSIS — M1990 Unspecified osteoarthritis, unspecified site: Secondary | ICD-10-CM | POA: Diagnosis not present

## 2015-06-09 DIAGNOSIS — N23 Unspecified renal colic: Secondary | ICD-10-CM

## 2015-06-09 DIAGNOSIS — R1012 Left upper quadrant pain: Secondary | ICD-10-CM | POA: Diagnosis present

## 2015-06-09 DIAGNOSIS — E785 Hyperlipidemia, unspecified: Secondary | ICD-10-CM | POA: Diagnosis not present

## 2015-06-09 DIAGNOSIS — I480 Paroxysmal atrial fibrillation: Secondary | ICD-10-CM | POA: Diagnosis not present

## 2015-06-09 DIAGNOSIS — I10 Essential (primary) hypertension: Secondary | ICD-10-CM | POA: Insufficient documentation

## 2015-06-09 DIAGNOSIS — N200 Calculus of kidney: Secondary | ICD-10-CM | POA: Insufficient documentation

## 2015-06-09 LAB — URINE MICROSCOPIC-ADD ON

## 2015-06-09 LAB — COMPREHENSIVE METABOLIC PANEL
ALBUMIN: 4.1 g/dL (ref 3.5–5.0)
ALT: 17 U/L (ref 14–54)
ANION GAP: 8 (ref 5–15)
AST: 21 U/L (ref 15–41)
Alkaline Phosphatase: 17 U/L — ABNORMAL LOW (ref 38–126)
BUN: 17 mg/dL (ref 6–20)
CHLORIDE: 102 mmol/L (ref 101–111)
CO2: 27 mmol/L (ref 22–32)
Calcium: 9.1 mg/dL (ref 8.9–10.3)
Creatinine, Ser: 0.92 mg/dL (ref 0.44–1.00)
GFR calc Af Amer: >60 mL/min (ref >60)
GFR calc non Af Amer: 54 mL/min — ABNORMAL LOW (ref >60)
GLUCOSE: 99 mg/dL (ref 65–99)
POTASSIUM: 3.8 mmol/L (ref 3.5–5.1)
SODIUM: 137 mmol/L (ref 135–145)
TOTAL PROTEIN: 7.4 g/dL (ref 6.5–8.1)
Total Bilirubin: 0.6 mg/dL (ref 0.3–1.2)

## 2015-06-09 LAB — URINALYSIS, ROUTINE W REFLEX MICROSCOPIC
BILIRUBIN URINE: NEGATIVE
Glucose, UA: NEGATIVE mg/dL
Hgb urine dipstick: NEGATIVE
KETONES UR: NEGATIVE mg/dL
NITRITE: NEGATIVE
PROTEIN: NEGATIVE mg/dL
Specific Gravity, Urine: 1.01 (ref 1.005–1.030)
pH: 6.5 (ref 5.0–8.0)

## 2015-06-09 LAB — CBC
HEMATOCRIT: 36.2 % (ref 36.0–46.0)
HEMOGLOBIN: 11.9 g/dL — AB (ref 12.0–15.0)
MCH: 30.8 pg (ref 26.0–34.0)
MCHC: 32.9 g/dL (ref 30.0–36.0)
MCV: 93.8 fL (ref 78.0–100.0)
Platelets: 171 10*3/uL (ref 150–400)
RBC: 3.86 MIL/uL — ABNORMAL LOW (ref 3.87–5.11)
RDW: 15.2 % (ref 11.5–15.5)
WBC: 5 10*3/uL (ref 4.0–10.5)

## 2015-06-09 NOTE — ED Provider Notes (Signed)
CSN: [id number]     Arrival date & time 06/09/15  1811 History  By signing my name below, I, Gwenlyn Fudge, attest that this documentation has been prepared under the direction and in the presence of Tanna Furry, MD. Electronically Signed: Gwenlyn Fudge, ED Scribe. 06/09/2015. 7:45 PM.   Chief Complaint  Patient presents with  . Abdominal Pain    The history is provided by the patient. No language interpreter was used.   HPI Comments: [name] [name] is a 82 y.o. female with hx of diverticulitis, who presents to the Emergency Department complaining of sudden onset, severe LUQ pain onset this evening at [facility]. Pt reports that she was getting clothed when she first experienced the pain. Pt reports that she took Hydrocodone with significant relief to the pain. Pt was last treated for diverticulitis in 04/2015, and reports her current pain is not similar. Pt denies hx of kidney stone. Pt denies pain radiation to flank or back, nausea, or diaphoresis.  Past Medical History  Diagnosis Date  . Paroxysmal a-fib ([place])   . Mitral and aortic regurgitation   . Mitral valve prolapse     hx of  . Hypertension   . Hyperlipidemia   . Vitamin D deficiency   . Pneumonia   . Tracheitis   . Gait difficulty   . Anemia   . Melena   . IBS (irritable bowel syndrome)   . Incontinence   . GERD (gastroesophageal reflux disease)   . Osteopenia   . Thyroid disease     hypo  . Personal history of colonic polyps   . Current use of long term anticoagulation   . Cough   . Diarrhea   . Pneumonia     organism unspecified  . Constipation   . UTI (lower urinary tract infection)   . Unspecified adverse effect of other drug, medicinal and biological substance(995.29)   . History of mammogram 2009  . Diverticulosis   . Hyperlipidemia   . Atrial fibrillation ([place])   . Arthritis     hips, knees  . Headache(784.0) 06/22/2012  . Renal insufficiency 06/28/2012  . Hyperkalemia 06/28/2012  . Anxiety   . Hypertension   .  Medicare annual wellness visit, subsequent 02/05/2014  . Dysrhythmia     a-fib  . Diverticulitis of colon 02/06/2015  . Loss of weight 06/05/2015   Past Surgical History  Procedure Laterality Date  . Breast biopsy Left   . Cardiac electrophysiology mapping and ablation    . Partial hysterectomy      ovaries left in place  . Cataract extraction, bilateral    . Esophagoscopy w/ botox injection    . Esophagogastroduodenoscopy N/A 09/03/2012    Procedure: ESOPHAGOGASTRODUODENOSCOPY (EGD);  Surgeon: Inda Castle, MD;  Location: [facility];  Service: Endoscopy;  Laterality: N/A;  . Botox injection N/A 09/03/2012    Procedure: BOTOX INJECTION;  Surgeon: Inda Castle, MD;  Location: [facility];  Service: Endoscopy;  Laterality: N/A;  . I&d extremity Right 11/06/2012    Procedure: IRRIGATION AND DEBRIDEMENT EXTREMITY Right Ring Finger;  Surgeon: Tennis Must, MD;  Location: [place];  Service: Orthopedics;  Laterality: Right;  . Breast lumpectomy Right 08/18/2014    Procedure: RIGHT BREAST LUMPECTOMY;  Surgeon: Coralie Keens, MD;  Location: [place];  Service: General;  Laterality: Right;   Family History  Problem Relation Age of Onset  . Diabetes Mother   . CVA Mother   . COPD Father   .  Rheum arthritis Father   . Heart disease Maternal Aunt   . Heart disease Maternal Uncle   . Allergies Daughter   . COPD Daughter   . Stroke Daughter   . COPD Daughter     previous smoker  . Stroke Maternal Grandfather   . Colon cancer Neg Hx   . Colon polyps Neg Hx   . Esophageal cancer Neg Hx   . Gallbladder disease Neg Hx   . Kidney disease Neg Hx   . Stroke Mother   . Heart attack Neg Hx   . Hypertension Neg Hx    Social History  Substance Use Topics  . Smoking status: Never Smoker   . Smokeless tobacco: Never Used  . Alcohol Use: No   OB History    No data available     Review of Systems  Constitutional: Negative for fever, chills, diaphoresis, appetite change and  fatigue.  HENT: Negative for mouth sores, sore throat and trouble swallowing.   Eyes: Negative for visual disturbance.  Respiratory: Negative for cough, chest tightness, shortness of breath and wheezing.   Cardiovascular: Negative for chest pain.  Gastrointestinal: Positive for abdominal pain. Negative for nausea, vomiting, diarrhea and abdominal distention.  Endocrine: Negative for polydipsia, polyphagia and polyuria.  Genitourinary: Negative for dysuria, frequency, hematuria and flank pain.  Musculoskeletal: Negative for back pain and gait problem.  Skin: Negative for color change, pallor and rash.  Neurological: Negative for dizziness, syncope, light-headedness and headaches.  Hematological: Does not bruise/bleed easily.  Psychiatric/Behavioral: Negative for behavioral problems and confusion.   Allergies  Darifenacin hydrobromide; Sulfonamide derivatives; and Tramadol  Home Medications   Prior to Admission medications   Medication Sig Start Date End Date Taking? Authorizing Provider  ALPRAZolam Duanne Moron) 0.25 MG tablet TAKE 1 TABLET BY MOUTH 3 TIMES A DAY AS NEEDED 04/07/15   Mosie Lukes, MD  atorvastatin (LIPITOR) 10 MG tablet TAKE 1 TABLET (10 MG TOTAL) BY MOUTH DAILY AT 6 PM. 03/17/15   Mosie Lukes, MD  Cholecalciferol (VITAMIN D) 2000 UNITS CAPS Take 2,000 Units by mouth every other day. 02/20/12   Mosie Lukes, MD  HYDROcodone-acetaminophen (NORCO/VICODIN) 5-325 MG tablet TAKE ONE TABLET BY MOUTH EVERY 6 HOURS AS NEEDED FOR SEVERE PAIN. 01/27/15   Historical Provider, MD  metoprolol tartrate (LOPRESSOR) 25 MG tablet Take 12.5 mg by mouth 2 (two) times daily.    Historical Provider, MD  metoprolol tartrate (LOPRESSOR) 25 MG tablet TAKE 0.5 TABLETS (12.5 MG TOTAL) BY MOUTH 2 TIMES DAILY. 04/03/15   Mosie Lukes, MD  ondansetron (ZOFRAN) 8 MG tablet Take 1 tablet (8 mg total) by mouth every 8 (eight) hours as needed for nausea or vomiting. 03/10/15   Nelida Meuse III, MD   pantoprazole (PROTONIX) 40 MG tablet TAKE 1 TABLET (40 MG TOTAL) BY MOUTH DAILY. 09/30/14   Mosie Lukes, MD  ramipril (ALTACE) 10 MG capsule TAKE 1 CAPSULE (10 MG TOTAL) BY MOUTH DAILY. 01/23/15   Mosie Lukes, MD  ranitidine (ZANTAC) 150 MG tablet Take 1 tablet (150 mg total) by mouth 2 (two) times daily. 02/01/15   Mosie Lukes, MD  vitamin B-12 (CYANOCOBALAMIN) 1000 MCG tablet Take 1 tablet (1,000 mcg total) by mouth daily. 02/20/12   Mosie Lukes, MD  warfarin (COUMADIN) 2.5 MG tablet TAKE AS DIRECTED BY ANTICOAGULATION CLINIC 03/17/15   Dorothy Spark, MD  zoster vaccine live, PF, (ZOSTAVAX) 24235 UNT/0.65ML injection Inject 19,400 Units into the skin once.  08/31/14   Mosie Lukes, MD   BP 182/70 mmHg  Pulse 93  Temp(Src) 98.3 F (36.8 C) (Oral)  Resp 18  Ht 5' 3"  (1.6 m)  Wt 102 lb (46.267 kg)  BMI 18.07 kg/m2  SpO2 98% Physical Exam  Constitutional: She is oriented to person, place, and time. She appears well-developed and well-nourished. No distress.  HENT:  Head: Normocephalic.  Eyes: Conjunctivae are normal. Pupils are equal, round, and reactive to light. No scleral icterus.  Neck: Normal range of motion. Neck supple. No thyromegaly present.  Cardiovascular: Normal rate and regular rhythm.  Exam reveals no gallop and no friction rub.   No murmur heard. Pulmonary/Chest: Effort normal and breath sounds normal. No respiratory distress. She has no wheezes. She has no rales.  Abdominal: Soft. Bowel sounds are normal. She exhibits no distension. There is tenderness. There is no rebound.  Very Slight tenderness in LLQ  Musculoskeletal: Normal range of motion.  Neurological: She is alert and oriented to person, place, and time.  Skin: Skin is warm and dry. No rash noted.  Psychiatric: She has a normal mood and affect. Her behavior is normal.  Nursing note and vitals reviewed.   ED Course  Procedures (including critical care time) DIAGNOSTIC STUDIES: Oxygen Saturation  is 99% on RA, normal by my interpretation.    COORDINATION OF CARE: 7:22 PM Discussed treatment plan with pt at bedside which includes CT Renal and lab work and pt agreed to plan.  Labs Review Labs Reviewed  URINALYSIS, ROUTINE W REFLEX MICROSCOPIC (NOT AT [facility]) - Abnormal; Notable for the following:    APPearance CLOUDY (*)    Leukocytes, UA TRACE (*)    All other components within normal limits  COMPREHENSIVE METABOLIC PANEL - Abnormal; Notable for the following:    Alkaline Phosphatase 17 (*)    GFR calc non Af Amer 54 (*)    All other components within normal limits  CBC - Abnormal; Notable for the following:    RBC 3.86 (*)    Hemoglobin 11.9 (*)    All other components within normal limits  URINE MICROSCOPIC-ADD ON - Abnormal; Notable for the following:    Squamous Epithelial / LPF 6-30 (*)    Bacteria, UA FEW (*)    All other components within normal limits    Imaging Review Ct Renal Stone Study  06/09/2015  CLINICAL DATA:  Left flank pain since yesterday. EXAM: CT ABDOMEN AND PELVIS WITHOUT CONTRAST TECHNIQUE: Multidetector CT imaging of the abdomen and pelvis was performed following the standard protocol without IV contrast. COMPARISON:  CT abdomen dated 03/05/2015 and CT abdomen dated 01/27/2015. Comparison also made to CT abdomen dated 04/20/2005. FINDINGS: Lower chest: No acute findings. Mild scarring/atelectasis at each lung base. Hiatal hernia, incompletely imaged, at least moderate in size. Hepatobiliary: Liver and gallbladder appear normal. Pancreas: No mass or inflammatory process identified on this un-enhanced exam. Spleen: Within normal limits in size. Adrenals/Urinary Tract: 1 mm stone within the left renal pelvis. Mild left-sided hydronephrosis. No ureteral or bladder calculi identified. Scattered calcifications within the pelvis, along the course of the left ureter, were seen on earlier exams an appear to be extra-ureteral phleboliths. Stomach/Bowel: Bowel is normal in  caliber. Fairly extensive diverticulosis again noted within the sigmoid colon but no inflammatory change to suggest acute diverticulitis at this time. No evidence of acute bowel wall inflammation. Moderate-sized hiatal hernia is incompletely imaged at the upper aspects of the exam. Stomach appears otherwise normal. Appendix is  not seen but there are no inflammatory changes about the cecum to suggest acute appendicitis. Vascular/Lymphatic: Atherosclerotic changes throughout the normal- caliber abdominal aorta and pelvic vasculature. Additional atherosclerotic changes within the aortic branch vessels. No enlarged lymph nodes seen within the abdomen or pelvis. Reproductive: Status post hysterectomy. Adnexal regions are unremarkable. Other: No free fluid or abscess collection seen. No free intraperitoneal air. Musculoskeletal: Scattered small lucent lesions within the lumbar spine, too small to definitively characterize, best seen on sagittal reconstructions series 6, images 52 and 53, possibly focal osteopenia. Mild degenerative change noted within the lumbar spine. Superficial soft tissues are unremarkable. IMPRESSION: 1. Punctate left renal stone. Mild left-sided hydronephrosis. No obstructing renal or ureteral calculi identified. Findings may be due to recently passed stone. 2. Colonic diverticulosis without evidence of acute diverticulitis. 3. Tiny lucent foci within the L1, L4 and L5 vertebral bodies. These are too small to definitively characterize, possibly localized osteopenia or bone cysts. These foci are new, however, compared to older studies raising the possibility of neoplastic process such as multiple myeloma. Consider lumbar spine MRI with contrast for further characterization. Electronically Signed   By: Franki Cabot M.D.   On: 06/09/2015 19:54   I have personally reviewed and evaluated these images and lab results as part of my medical decision-making.   EKG Interpretation None      MDM    Final diagnoses:  Ureteral colic  Kidney stone    Patient remains asymptomatic. CT scan shows dilatation of the left collecting system without obvious radiopaque stone. She is symptom-free. Only trace of RBCs in her urine. I think she very likely passed ureteral stone. She did not have stones evident on her CT scan from a few months ago. Has a solitary left renal calculus visible today. It made her aware of this. Recheck here with any recurrence of symptoms.  I discussed the abnormality is her lumbar spine and made recommendation that she follow up with her primary care physician with consideration of lumbar spine MRI.  I personally performed the services described in this documentation, which was scribed in my presence. The recorded information has been reviewed and is accurate.    Tanna Furry, MD 06/09/15 [phone]

## 2015-06-09 NOTE — Discharge Instructions (Signed)
You passed a kidney stone tonight. No additional treatment is necessary. You have another kidney stone in your left kidney. It will not cause you pain unless it passes. Return to emergency room with recurrence of symptoms. You have some abnormalities of your lumbar spine. Radiologist recommends an MRI. Please follow-up with your primary care physician within the next few weeks to discuss this.  Kidney Stones Kidney stones (urolithiasis) are deposits that form inside your kidneys. The intense pain is caused by the stone moving through the urinary tract. When the stone moves, the ureter goes into spasm around the stone. The stone is usually passed in the urine.  CAUSES   A disorder that makes certain neck glands produce too much parathyroid hormone (primary hyperparathyroidism).  A buildup of uric acid crystals, similar to gout in your joints.  Narrowing (stricture) of the ureter.  A kidney obstruction present at birth (congenital obstruction).  Previous surgery on the kidney or ureters.  Numerous kidney infections. SYMPTOMS   Feeling sick to your stomach (nauseous).  Throwing up (vomiting).  Blood in the urine (hematuria).  Pain that usually spreads (radiates) to the groin.  Frequency or urgency of urination. DIAGNOSIS   Taking a history and physical exam.  Blood or urine tests.  CT scan.  Occasionally, an examination of the inside of the urinary bladder (cystoscopy) is performed. TREATMENT   Observation.  Increasing your fluid intake.  Extracorporeal shock wave lithotripsy--This is a noninvasive procedure that uses shock waves to break up kidney stones.  Surgery may be needed if you have severe pain or persistent obstruction. There are various surgical procedures. Most of the procedures are performed with the use of small instruments. Only small incisions are needed to accommodate these instruments, so recovery time is minimized. The size, location, and chemical  composition are all important variables that will determine the proper choice of action for you. Talk to your health care provider to better understand your situation so that you will minimize the risk of injury to yourself and your kidney.  HOME CARE INSTRUCTIONS   Drink enough water and fluids to keep your urine clear or pale yellow. This will help you to pass the stone or stone fragments.  Strain all urine through the provided strainer. Keep all particulate matter and stones for your health care provider to see. The stone causing the pain may be as small as a grain of salt. It is very important to use the strainer each and every time you pass your urine. The collection of your stone will allow your health care provider to analyze it and verify that a stone has actually passed. The stone analysis will often identify what you can do to reduce the incidence of recurrences.  Only take over-the-counter or prescription medicines for pain, discomfort, or fever as directed by your health care provider.  Keep all follow-up visits as told by your health care provider. This is important.  Get follow-up X-rays if required. The absence of pain does not always mean that the stone has passed. It may have only stopped moving. If the urine remains completely obstructed, it can cause loss of kidney function or even complete destruction of the kidney. It is your responsibility to make sure X-rays and follow-ups are completed. Ultrasounds of the kidney can show blockages and the status of the kidney. Ultrasounds are not associated with any radiation and can be performed easily in a matter of minutes.  Make changes to your daily diet as told by  your health care provider. You may be told to:  Limit the amount of salt that you eat.  Eat 5 or more servings of fruits and vegetables each day.  Limit the amount of meat, poultry, fish, and eggs that you eat.  Collect a 24-hour urine sample as told by your health care  provider.You may need to collect another urine sample every 6-12 months. SEEK MEDICAL CARE IF:  You experience pain that is progressive and unresponsive to any pain medicine you have been prescribed. SEEK IMMEDIATE MEDICAL CARE IF:   Pain cannot be controlled with the prescribed medicine.  You have a fever or shaking chills.  The severity or intensity of pain increases over 18 hours and is not relieved by pain medicine.  You develop a new onset of abdominal pain.  You feel faint or pass out.  You are unable to urinate.   This information is not intended to replace advice given to you by your health care provider. Make sure you discuss any questions you have with your health care provider.   Document Released: 12/18/2004 Document Revised: 09/08/2014 Document Reviewed: 05/21/2012 Elsevier Interactive Patient Education [employer].

## 2015-06-09 NOTE — ED Notes (Signed)
LLQ pain x 1 hour-denies n/v/d-reports hx diverticulitis-took hydrocodone PTA and denies pain-presents to triage in w/c with daughter-NAD

## 2015-06-20 ENCOUNTER — Ambulatory Visit (INDEPENDENT_AMBULATORY_CARE_PROVIDER_SITE_OTHER): Payer: Medicare Other | Admitting: Family Medicine

## 2015-06-20 ENCOUNTER — Encounter: Payer: Self-pay | Admitting: Family Medicine

## 2015-06-20 VITALS — BP 140/78 | HR 66 | Temp 98.2°F | Ht 59.0 in | Wt 104.5 lb

## 2015-06-20 DIAGNOSIS — R739 Hyperglycemia, unspecified: Secondary | ICD-10-CM | POA: Diagnosis not present

## 2015-06-20 DIAGNOSIS — R350 Frequency of micturition: Secondary | ICD-10-CM

## 2015-06-20 DIAGNOSIS — N200 Calculus of kidney: Secondary | ICD-10-CM

## 2015-06-20 DIAGNOSIS — M899 Disorder of bone, unspecified: Secondary | ICD-10-CM

## 2015-06-20 DIAGNOSIS — M949 Disorder of cartilage, unspecified: Secondary | ICD-10-CM

## 2015-06-20 DIAGNOSIS — I10 Essential (primary) hypertension: Secondary | ICD-10-CM | POA: Diagnosis not present

## 2015-06-20 NOTE — Progress Notes (Signed)
Pre visit review using our clinic review tool, if applicable. No additional management support is needed unless otherwise documented below in the visit note. 

## 2015-06-20 NOTE — Patient Instructions (Signed)
Kidney Stones °Kidney stones (urolithiasis) are deposits that form inside your kidneys. The intense pain is caused by the stone moving through the urinary tract. When the stone moves, the ureter goes into spasm around the stone. The stone is usually passed in the urine.  °CAUSES  °· A disorder that makes certain neck glands produce too much parathyroid hormone (primary hyperparathyroidism). °· A buildup of uric acid crystals, similar to gout in your joints. °· Narrowing (stricture) of the ureter. °· A kidney obstruction present at birth (congenital obstruction). °· Previous surgery on the kidney or ureters. °· Numerous kidney infections. °SYMPTOMS  °· Feeling sick to your stomach (nauseous). °· Throwing up (vomiting). °· Blood in the urine (hematuria). °· Pain that usually spreads (radiates) to the groin. °· Frequency or urgency of urination. °DIAGNOSIS  °· Taking a history and physical exam. °· Blood or urine tests. °· CT scan. °· Occasionally, an examination of the inside of the urinary bladder (cystoscopy) is performed. °TREATMENT  °· Observation. °· Increasing your fluid intake. °· Extracorporeal shock wave lithotripsy--This is a noninvasive procedure that uses shock waves to break up kidney stones. °· Surgery may be needed if you have severe pain or persistent obstruction. There are various surgical procedures. Most of the procedures are performed with the use of small instruments. Only small incisions are needed to accommodate these instruments, so recovery time is minimized. °The size, location, and chemical composition are all important variables that will determine the proper choice of action for you. Talk to your health care provider to better understand your situation so that you will minimize the risk of injury to yourself and your kidney.  °HOME CARE INSTRUCTIONS  °· Drink enough water and fluids to keep your urine clear or pale yellow. This will help you to pass the stone or stone fragments. °· Strain  all urine through the provided strainer. Keep all particulate matter and stones for your health care provider to see. The stone causing the pain may be as small as a grain of salt. It is very important to use the strainer each and every time you pass your urine. The collection of your stone will allow your health care provider to analyze it and verify that a stone has actually passed. The stone analysis will often identify what you can do to reduce the incidence of recurrences. °· Only take over-the-counter or prescription medicines for pain, discomfort, or fever as directed by your health care provider. °· Keep all follow-up visits as told by your health care provider. This is important. °· Get follow-up X-rays if required. The absence of pain does not always mean that the stone has passed. It may have only stopped moving. If the urine remains completely obstructed, it can cause loss of kidney function or even complete destruction of the kidney. It is your responsibility to make sure X-rays and follow-ups are completed. Ultrasounds of the kidney can show blockages and the status of the kidney. Ultrasounds are not associated with any radiation and can be performed easily in a matter of minutes. °· Make changes to your daily diet as told by your health care provider. You may be told to: °¨ Limit the amount of salt that you eat. °¨ Eat 5 or more servings of fruits and vegetables each day. °¨ Limit the amount of meat, poultry, fish, and eggs that you eat. °· Collect a 24-hour urine sample as told by your health care provider. You may need to collect another urine sample every 6-12   months. °SEEK MEDICAL CARE IF: °· You experience pain that is progressive and unresponsive to any pain medicine you have been prescribed. °SEEK IMMEDIATE MEDICAL CARE IF:  °· Pain cannot be controlled with the prescribed medicine. °· You have a fever or shaking chills. °· The severity or intensity of pain increases over 18 hours and is not  relieved by pain medicine. °· You develop a new onset of abdominal pain. °· You feel faint or pass out. °· You are unable to urinate. °  °This information is not intended to replace advice given to you by your health care provider. Make sure you discuss any questions you have with your health care provider. °  °Document Released: 12/18/2004 Document Revised: 09/08/2014 Document Reviewed: 05/21/2012 °Elsevier Interactive Patient Education ©2016 Elsevier Inc. ° °

## 2015-06-21 LAB — URINALYSIS
Bilirubin Urine: NEGATIVE
Hgb urine dipstick: NEGATIVE
KETONES UR: NEGATIVE
Leukocytes, UA: NEGATIVE
Nitrite: NEGATIVE
PH: 6 (ref 5.0–8.0)
SPECIFIC GRAVITY, URINE: 1.015 (ref 1.000–1.030)
TOTAL PROTEIN, URINE-UPE24: NEGATIVE
URINE GLUCOSE: NEGATIVE
Urobilinogen, UA: 0.2 (ref 0.0–1.0)

## 2015-06-21 LAB — URINE CULTURE
Colony Count: NO GROWTH
Organism ID, Bacteria: NO GROWTH

## 2015-06-24 ENCOUNTER — Ambulatory Visit (INDEPENDENT_AMBULATORY_CARE_PROVIDER_SITE_OTHER): Payer: Medicare Other | Admitting: *Deleted

## 2015-06-24 DIAGNOSIS — Z5181 Encounter for therapeutic drug level monitoring: Secondary | ICD-10-CM | POA: Diagnosis not present

## 2015-06-24 DIAGNOSIS — I4891 Unspecified atrial fibrillation: Secondary | ICD-10-CM | POA: Diagnosis not present

## 2015-06-24 DIAGNOSIS — Z7901 Long term (current) use of anticoagulants: Secondary | ICD-10-CM

## 2015-06-24 LAB — POCT INR: INR: 2.2

## 2015-06-26 ENCOUNTER — Encounter: Payer: Self-pay | Admitting: Family Medicine

## 2015-06-26 DIAGNOSIS — N200 Calculus of kidney: Secondary | ICD-10-CM

## 2015-06-26 HISTORY — DX: Calculus of kidney: N20.0

## 2015-06-26 NOTE — Assessment & Plan Note (Signed)
Well controlled, no changes to meds. Encouraged heart healthy diet such as the DASH diet and exercise as tolerated.  °

## 2015-06-26 NOTE — Assessment & Plan Note (Signed)
Here for hospital follow up after presenting to ER and being diagnosed with kidney stones. She is feeling well today except that she is noting some urinary frequency. No dysuria and urinalysis is unremarkable.

## 2015-06-26 NOTE — Progress Notes (Signed)
Patient ID: [name] [name], female   DOB: 1924-09-14, 82 y.o.   MRN: [id number]   Subjective:    Patient ID: [name] [name], female    DOB: 1924-06-22, 82 y.o.   MRN: [id number]  Chief Complaint  Patient presents with  . Follow-up    kidney stone    HPI Patient is in today for ER follow up. She was seen in ER and diagnosed with kidney stones. She is feeling better today. Only notes some urinary frequency but no incontinence, hematuria or dysuria. Otherwise feels well at this time. Denies CP/palp/SOB/HA/congestion/fevers/GI c/o. Taking meds as prescribed  Past Medical History  Diagnosis Date  . Paroxysmal a-fib ([place])   . Mitral and aortic regurgitation   . Mitral valve prolapse     hx of  . Hypertension   . Hyperlipidemia   . Vitamin D deficiency   . Pneumonia   . Tracheitis   . Gait difficulty   . Anemia   . Melena   . IBS (irritable bowel syndrome)   . Incontinence   . GERD (gastroesophageal reflux disease)   . Osteopenia   . Thyroid disease     hypo  . Personal history of colonic polyps   . Current use of long term anticoagulation   . Cough   . Diarrhea   . Pneumonia     organism unspecified  . Constipation   . UTI (lower urinary tract infection)   . Unspecified adverse effect of other drug, medicinal and biological substance(995.29)   . History of mammogram 2009  . Diverticulosis   . Hyperlipidemia   . Atrial fibrillation ([place])   . Arthritis     hips, knees  . Headache(784.0) 06/22/2012  . Renal insufficiency 06/28/2012  . Hyperkalemia 06/28/2012  . Anxiety   . Hypertension   . Medicare annual wellness visit, subsequent 02/05/2014  . Dysrhythmia     a-fib  . Diverticulitis of colon 02/06/2015  . Loss of weight 06/05/2015  . Renal lithiasis 06/26/2015    Past Surgical History  Procedure Laterality Date  . Breast biopsy Left   . Cardiac electrophysiology mapping and ablation    . Partial hysterectomy      ovaries left in place  . Cataract extraction, bilateral     . Esophagoscopy w/ botox injection    . Esophagogastroduodenoscopy N/A 09/03/2012    Procedure: ESOPHAGOGASTRODUODENOSCOPY (EGD);  Surgeon: Inda Castle, MD;  Location: [facility];  Service: Endoscopy;  Laterality: N/A;  . Botox injection N/A 09/03/2012    Procedure: BOTOX INJECTION;  Surgeon: Inda Castle, MD;  Location: [facility];  Service: Endoscopy;  Laterality: N/A;  . I&d extremity Right 11/06/2012    Procedure: IRRIGATION AND DEBRIDEMENT EXTREMITY Right Ring Finger;  Surgeon: Tennis Must, MD;  Location: [facility];  Service: Orthopedics;  Laterality: Right;  . Breast lumpectomy Right 08/18/2014    Procedure: RIGHT BREAST LUMPECTOMY;  Surgeon: Coralie Keens, MD;  Location: [facility];  Service: General;  Laterality: Right;    Family History  Problem Relation Age of Onset  . Diabetes Mother   . CVA Mother   . COPD Father   . Rheum arthritis Father   . Heart disease Maternal Aunt   . Heart disease Maternal Uncle   . Allergies Daughter   . COPD Daughter   . Stroke Daughter   . COPD Daughter     previous smoker  . Stroke Maternal Grandfather   . Colon cancer Neg Hx   .  Colon polyps Neg Hx   . Esophageal cancer Neg Hx   . Gallbladder disease Neg Hx   . Kidney disease Neg Hx   . Stroke Mother   . Heart attack Neg Hx   . Hypertension Neg Hx     Social History   Social History  . Marital Status: Widowed    Spouse Name: N/A  . Number of Children: 5  . Years of Education: N/A   Occupational History  . retired    Social History Main Topics  . Smoking status: Never Smoker   . Smokeless tobacco: Never Used  . Alcohol Use: No  . Drug Use: No  . Sexual Activity: Not on file     Comment: lives with Daughter, grandson and his family. no dietary restricitons.   Other Topics Concern  . Not on file   Social History Narrative    Outpatient Prescriptions Prior to Visit  Medication Sig Dispense Refill  . ALPRAZolam (XANAX) 0.25 MG tablet TAKE 1  TABLET BY MOUTH 3 TIMES A DAY AS NEEDED 90 tablet 1  . atorvastatin (LIPITOR) 10 MG tablet TAKE 1 TABLET (10 MG TOTAL) BY MOUTH DAILY AT 6 PM. 30 tablet 4  . Cholecalciferol (VITAMIN D) 2000 UNITS CAPS Take 2,000 Units by mouth every other day.    Marland Kitchen HYDROcodone-acetaminophen (NORCO/VICODIN) 5-325 MG tablet TAKE ONE TABLET BY MOUTH EVERY 6 HOURS AS NEEDED FOR SEVERE PAIN.  0  . metoprolol tartrate (LOPRESSOR) 25 MG tablet Take 12.5 mg by mouth 2 (two) times daily.    . metoprolol tartrate (LOPRESSOR) 25 MG tablet TAKE 0.5 TABLETS (12.5 MG TOTAL) BY MOUTH 2 TIMES DAILY. 90 tablet 1  . ondansetron (ZOFRAN) 8 MG tablet Take 1 tablet (8 mg total) by mouth every 8 (eight) hours as needed for nausea or vomiting. 30 tablet 0  . pantoprazole (PROTONIX) 40 MG tablet TAKE 1 TABLET (40 MG TOTAL) BY MOUTH DAILY. 90 tablet 3  . ramipril (ALTACE) 10 MG capsule TAKE 1 CAPSULE (10 MG TOTAL) BY MOUTH DAILY. 90 capsule 3  . ranitidine (ZANTAC) 150 MG tablet Take 1 tablet (150 mg total) by mouth 2 (two) times daily. 30 tablet 3  . vitamin B-12 (CYANOCOBALAMIN) 1000 MCG tablet Take 1 tablet (1,000 mcg total) by mouth daily.    Marland Kitchen warfarin (COUMADIN) 2.5 MG tablet TAKE AS DIRECTED BY ANTICOAGULATION CLINIC 40 tablet 3  . zoster vaccine live, PF, (ZOSTAVAX) 57846 UNT/0.65ML injection Inject 19,400 Units into the skin once. 1 each 0   No facility-administered medications prior to visit.    Allergies  Allergen Reactions  . Darifenacin Hydrobromide     REACTION: causes her dizziness  . Sulfonamide Derivatives   . Tramadol Other (See Comments)    Insomnia, anorexia    Review of Systems  Constitutional: Negative for fever and malaise/fatigue.  HENT: Negative for congestion.   Eyes: Negative for blurred vision.  Respiratory: Negative for shortness of breath.   Cardiovascular: Negative for chest pain, palpitations and leg swelling.  Gastrointestinal: Negative for nausea, abdominal pain and blood in stool.    Genitourinary: Positive for frequency. Negative for dysuria.  Musculoskeletal: Negative for falls.  Skin: Negative for rash.  Neurological: Positive for weakness. Negative for dizziness, loss of consciousness and headaches.  Endo/Heme/Allergies: Negative for environmental allergies.  Psychiatric/Behavioral: Negative for depression. The patient is not nervous/anxious.        Objective:    Physical Exam  Constitutional: She is oriented to person, place, and time.  She appears well-developed and well-nourished. No distress.  HENT:  Head: Normocephalic and atraumatic.  Nose: Nose normal.  Eyes: Right eye exhibits no discharge. Left eye exhibits no discharge.  Neck: Normal range of motion. Neck supple.  Cardiovascular: Normal rate and regular rhythm.   No murmur heard. Pulmonary/Chest: Effort normal and breath sounds normal.  Abdominal: Soft. Bowel sounds are normal. There is no tenderness.  Musculoskeletal: She exhibits no edema.  Neurological: She is alert and oriented to person, place, and time.  Skin: Skin is warm and dry.  Psychiatric: She has a normal mood and affect.  Nursing note and vitals reviewed.   BP 140/78 mmHg  Pulse 66  Temp(Src) 98.2 F (36.8 C) (Oral)  Ht 4\' 11"  (1.499 m)  Wt 104 lb 8 oz (47.401 kg)  BMI 21.10 kg/m2  SpO2 97% Wt Readings from Last 3 Encounters:  06/20/15 104 lb 8 oz (47.401 kg)  06/09/15 102 lb (46.267 kg)  05/27/15 102 lb 4 oz (46.38 kg)     Lab Results  Component Value Date   WBC 5.0 06/09/2015   HGB 11.9* 06/09/2015   HCT 36.2 06/09/2015   PLT 171 06/09/2015   GLUCOSE 99 06/09/2015   CHOL 80 02/01/2015   TRIG 75.0 02/01/2015   HDL 37.30* 02/01/2015   LDLDIRECT 72.1 01/19/2009   LDLCALC 28 02/01/2015   ALT 17 06/09/2015   AST 21 06/09/2015   NA 137 06/09/2015   K 3.8 06/09/2015   CL 102 06/09/2015   CREATININE 0.92 06/09/2015   BUN 17 06/09/2015   CO2 27 06/09/2015   TSH 4.88* 02/01/2015   INR 2.2 06/24/2015   HGBA1C  5.9 02/01/2015    Lab Results  Component Value Date   TSH 4.88* 02/01/2015   Lab Results  Component Value Date   WBC 5.0 06/09/2015   HGB 11.9* 06/09/2015   HCT 36.2 06/09/2015   MCV 93.8 06/09/2015   PLT 171 06/09/2015   Lab Results  Component Value Date   NA 137 06/09/2015   K 3.8 06/09/2015   CO2 27 06/09/2015   GLUCOSE 99 06/09/2015   BUN 17 06/09/2015   CREATININE 0.92 06/09/2015   BILITOT 0.6 06/09/2015   ALKPHOS 17* 06/09/2015   AST 21 06/09/2015   ALT 17 06/09/2015   PROT 7.4 06/09/2015   ALBUMIN 4.1 06/09/2015   CALCIUM 9.1 06/09/2015   ANIONGAP 8 06/09/2015   GFR 49.17* 04/01/2015   Lab Results  Component Value Date   CHOL 80 02/01/2015   Lab Results  Component Value Date   HDL 37.30* 02/01/2015   Lab Results  Component Value Date   LDLCALC 28 02/01/2015   Lab Results  Component Value Date   TRIG 75.0 02/01/2015   Lab Results  Component Value Date   CHOLHDL 2 02/01/2015   Lab Results  Component Value Date   HGBA1C 5.9 02/01/2015       Assessment & Plan:   Problem List Items Addressed This Visit    Renal lithiasis    Here for hospital follow up after presenting to ER and being diagnosed with kidney stones. She is feeling well today except that she is noting some urinary frequency. No dysuria and urinalysis is unremarkable.       Hyperglycemia    minimize simple carbs. Increase exercise as tolerated      Essential hypertension    Well controlled, no changes to meds. Encouraged heart healthy diet such as the DASH diet and exercise as  tolerated.       Disorder of bone and cartilage    Recent CT scan showed osteopenia vs bone cysts vs less likely neoplastic process. She is asymptomatic and declines further work up, family is with her and is supportive.        Other Visit Diagnoses    Urinary frequency    -  Primary    Relevant Orders    Urinalysis (Completed)    Urine culture (Completed)       I am having Ms. Foor maintain  her vitamin B-12, Vitamin D, zoster vaccine live (PF), pantoprazole, metoprolol tartrate, ramipril, ranitidine, ondansetron, HYDROcodone-acetaminophen, atorvastatin, warfarin, metoprolol tartrate, and ALPRAZolam.  No orders of the defined types were placed in this encounter.     Penni Homans, MD

## 2015-06-26 NOTE — Assessment & Plan Note (Signed)
minimize simple carbs. Increase exercise as tolerated.  

## 2015-06-26 NOTE — Assessment & Plan Note (Signed)
Recent CT scan showed osteopenia vs bone cysts vs less likely neoplastic process. She is asymptomatic and declines further work up, family is with her and is supportive.

## 2015-06-29 ENCOUNTER — Other Ambulatory Visit: Payer: Self-pay | Admitting: Family Medicine

## 2015-06-30 NOTE — Telephone Encounter (Signed)
Faxed hardcopy for Alprazolam to [facility] in [facility]

## 2015-07-15 ENCOUNTER — Encounter: Payer: Self-pay | Admitting: Family Medicine

## 2015-07-15 ENCOUNTER — Ambulatory Visit (INDEPENDENT_AMBULATORY_CARE_PROVIDER_SITE_OTHER): Payer: Medicare Other | Admitting: Family Medicine

## 2015-07-15 VITALS — BP 132/70 | HR 66 | Temp 98.3°F | Ht 59.0 in | Wt 102.1 lb

## 2015-07-15 DIAGNOSIS — E782 Mixed hyperlipidemia: Secondary | ICD-10-CM

## 2015-07-15 DIAGNOSIS — D649 Anemia, unspecified: Secondary | ICD-10-CM | POA: Diagnosis not present

## 2015-07-15 DIAGNOSIS — E039 Hypothyroidism, unspecified: Secondary | ICD-10-CM

## 2015-07-15 DIAGNOSIS — R739 Hyperglycemia, unspecified: Secondary | ICD-10-CM

## 2015-07-15 DIAGNOSIS — I10 Essential (primary) hypertension: Secondary | ICD-10-CM

## 2015-07-15 LAB — COMPREHENSIVE METABOLIC PANEL
ALK PHOS: 15 U/L — AB (ref 39–117)
ALT: 17 U/L (ref 0–35)
AST: 19 U/L (ref 0–37)
Albumin: 3.9 g/dL (ref 3.5–5.2)
BILIRUBIN TOTAL: 0.5 mg/dL (ref 0.2–1.2)
BUN: 20 mg/dL (ref 6–23)
CALCIUM: 9.3 mg/dL (ref 8.4–10.5)
CO2: 29 mEq/L (ref 19–32)
CREATININE: 1.1 mg/dL (ref 0.40–1.20)
Chloride: 104 mEq/L (ref 96–112)
GFR: 49.65 mL/min — AB (ref >60.00)
GLUCOSE: 83 mg/dL (ref 70–99)
Potassium: 3.7 mEq/L (ref 3.5–5.1)
Sodium: 142 mEq/L (ref 135–145)
TOTAL PROTEIN: 7.2 g/dL (ref 6.0–8.3)

## 2015-07-15 LAB — TSH: TSH: 4.05 u[IU]/mL (ref 0.35–4.50)

## 2015-07-15 LAB — LIPID PANEL
CHOLESTEROL: 121 mg/dL (ref 0–200)
HDL: 38.1 mg/dL — ABNORMAL LOW (ref >39.00)
LDL Cholesterol: 63 mg/dL (ref 0–99)
NonHDL: 82.67
Total CHOL/HDL Ratio: 3
Triglycerides: 99 mg/dL (ref 0.0–149.0)
VLDL: 19.8 mg/dL (ref 0.0–40.0)

## 2015-07-15 LAB — CBC
HEMATOCRIT: 35.5 % — AB (ref 36.0–46.0)
HEMOGLOBIN: 11.9 g/dL — AB (ref 12.0–15.0)
MCHC: 33.7 g/dL (ref 30.0–36.0)
MCV: 90.7 fl (ref 78.0–100.0)
Platelets: 160 10*3/uL (ref 150.0–400.0)
RBC: 3.91 Mil/uL (ref 3.87–5.11)
RDW: 14.7 % (ref 11.5–15.5)
WBC: 6.1 10*3/uL (ref 4.0–10.5)

## 2015-07-15 LAB — VITAMIN B12: Vitamin B-12: 1144 pg/mL — ABNORMAL HIGH (ref 211–911)

## 2015-07-15 NOTE — Assessment & Plan Note (Signed)
Eating well, no complaints. Minimize simple carbs

## 2015-07-15 NOTE — Progress Notes (Signed)
Patient ID: [name] [name], female   DOB: 01/25/1924, 82 y.o.   MRN: [id number]   Subjective:    Patient ID: [name] [name], female    DOB: 28-Aug-1924, 82 y.o.   MRN: [id number]  Chief Complaint  Patient presents with  . Follow-up    HPI Patient is in today for follow up accompanied by family. She continues to do well. No recent illness or acute concerns. Denies CP/palp/SOB/HA/congestion/fevers/GI or GU c/o. Taking meds as prescribed  Past Medical History  Diagnosis Date  . Paroxysmal a-fib ([place])   . Mitral and aortic regurgitation   . Mitral valve prolapse     hx of  . Hypertension   . Hyperlipidemia   . Vitamin D deficiency   . Pneumonia   . Tracheitis   . Gait difficulty   . Anemia   . Melena   . IBS (irritable bowel syndrome)   . Incontinence   . GERD (gastroesophageal reflux disease)   . Osteopenia   . Thyroid disease     hypo  . Personal history of colonic polyps   . Current use of long term anticoagulation   . Cough   . Diarrhea   . Pneumonia     organism unspecified  . Constipation   . UTI (lower urinary tract infection)   . Unspecified adverse effect of other drug, medicinal and biological substance(995.29)   . History of mammogram 2009  . Diverticulosis   . Hyperlipidemia   . Atrial fibrillation ([place])   . Arthritis     hips, knees  . Headache(784.0) 06/22/2012  . Renal insufficiency 06/28/2012  . Hyperkalemia 06/28/2012  . Anxiety   . Hypertension   . Medicare annual wellness visit, subsequent 02/05/2014  . Dysrhythmia     a-fib  . Diverticulitis of colon 02/06/2015  . Loss of weight 06/05/2015  . Renal lithiasis 06/26/2015    Past Surgical History  Procedure Laterality Date  . Breast biopsy Left   . Cardiac electrophysiology mapping and ablation    . Partial hysterectomy      ovaries left in place  . Cataract extraction, bilateral    . Esophagoscopy w/ botox injection    . Esophagogastroduodenoscopy N/A 09/03/2012    Procedure:  ESOPHAGOGASTRODUODENOSCOPY (EGD);  Surgeon: Inda Castle, MD;  Location: [facility];  Service: Endoscopy;  Laterality: N/A;  . Botox injection N/A 09/03/2012    Procedure: BOTOX INJECTION;  Surgeon: Inda Castle, MD;  Location: [facility];  Service: Endoscopy;  Laterality: N/A;  . I&d extremity Right 11/06/2012    Procedure: IRRIGATION AND DEBRIDEMENT EXTREMITY Right Ring Finger;  Surgeon: Tennis Must, MD;  Location: [facility];  Service: Orthopedics;  Laterality: Right;  . Breast lumpectomy Right 08/18/2014    Procedure: RIGHT BREAST LUMPECTOMY;  Surgeon: Coralie Keens, MD;  Location: [facility];  Service: General;  Laterality: Right;    Family History  Problem Relation Age of Onset  . Diabetes Mother   . CVA Mother   . COPD Father   . Rheum arthritis Father   . Heart disease Maternal Aunt   . Heart disease Maternal Uncle   . Allergies Daughter   . COPD Daughter   . Stroke Daughter   . COPD Daughter     previous smoker  . Stroke Maternal Grandfather   . Colon cancer Neg Hx   . Colon polyps Neg Hx   . Esophageal cancer Neg Hx   . Gallbladder disease Neg Hx   .  Kidney disease Neg Hx   . Stroke Mother   . Heart attack Neg Hx   . Hypertension Neg Hx     Social History   Social History  . Marital Status: Widowed    Spouse Name: N/A  . Number of Children: 5  . Years of Education: N/A   Occupational History  . retired    Social History Main Topics  . Smoking status: Never Smoker   . Smokeless tobacco: Never Used  . Alcohol Use: No  . Drug Use: No  . Sexual Activity: Not on file     Comment: lives with Daughter, grandson and his family. no dietary restricitons.   Other Topics Concern  . Not on file   Social History Narrative    Outpatient Prescriptions Prior to Visit  Medication Sig Dispense Refill  . ALPRAZolam (XANAX) 0.25 MG tablet TAKE1 TABLET BY MOUTH THREE TIMES DAILY 90 tablet 1  . atorvastatin (LIPITOR) 10 MG tablet TAKE 1 TABLET (10  MG TOTAL) BY MOUTH DAILY AT 6 PM. 30 tablet 4  . Cholecalciferol (VITAMIN D) 2000 UNITS CAPS Take 2,000 Units by mouth every other day.    Marland Kitchen HYDROcodone-acetaminophen (NORCO/VICODIN) 5-325 MG tablet TAKE ONE TABLET BY MOUTH EVERY 6 HOURS AS NEEDED FOR SEVERE PAIN.  0  . metoprolol tartrate (LOPRESSOR) 25 MG tablet Take 12.5 mg by mouth 2 (two) times daily.    . metoprolol tartrate (LOPRESSOR) 25 MG tablet TAKE 0.5 TABLETS (12.5 MG TOTAL) BY MOUTH 2 TIMES DAILY. 90 tablet 1  . ondansetron (ZOFRAN) 8 MG tablet Take 1 tablet (8 mg total) by mouth every 8 (eight) hours as needed for nausea or vomiting. 30 tablet 0  . pantoprazole (PROTONIX) 40 MG tablet TAKE 1 TABLET (40 MG TOTAL) BY MOUTH DAILY. 90 tablet 3  . ramipril (ALTACE) 10 MG capsule TAKE 1 CAPSULE (10 MG TOTAL) BY MOUTH DAILY. 90 capsule 3  . ranitidine (ZANTAC) 150 MG tablet Take 1 tablet (150 mg total) by mouth 2 (two) times daily. 30 tablet 3  . vitamin B-12 (CYANOCOBALAMIN) 1000 MCG tablet Take 1 tablet (1,000 mcg total) by mouth daily.    Marland Kitchen warfarin (COUMADIN) 2.5 MG tablet TAKE AS DIRECTED BY ANTICOAGULATION CLINIC 40 tablet 3  . zoster vaccine live, PF, (ZOSTAVAX) 16109 UNT/0.65ML injection Inject 19,400 Units into the skin once. 1 each 0   No facility-administered medications prior to visit.    Allergies  Allergen Reactions  . Darifenacin Hydrobromide     REACTION: causes her dizziness  . Sulfonamide Derivatives   . Tramadol Other (See Comments)    Insomnia, anorexia    Review of Systems  Constitutional: Negative for fever, chills and malaise/fatigue.  HENT: Negative for congestion and hearing loss.   Eyes: Negative for discharge.  Respiratory: Negative for cough, sputum production and shortness of breath.   Cardiovascular: Negative for chest pain, palpitations and leg swelling.  Gastrointestinal: Negative for heartburn, nausea, vomiting, abdominal pain, diarrhea, constipation and blood in stool.  Genitourinary: Negative  for dysuria, urgency, frequency and hematuria.  Musculoskeletal: Negative for myalgias, back pain and falls.  Skin: Negative for rash.  Neurological: Negative for dizziness, sensory change, loss of consciousness, weakness and headaches.  Endo/Heme/Allergies: Negative for environmental allergies. Does not bruise/bleed easily.  Psychiatric/Behavioral: Negative for depression and suicidal ideas. The patient is not nervous/anxious and does not have insomnia.        Objective:    Physical Exam  Constitutional: She is oriented to person, place,  and time. She appears well-developed and well-nourished. No distress.  HENT:  Head: Normocephalic and atraumatic.  Nose: Nose normal.  Eyes: Right eye exhibits no discharge. Left eye exhibits no discharge.  Neck: Normal range of motion. Neck supple.  Cardiovascular: Normal rate.   No murmur heard. irregularly irregular  Pulmonary/Chest: Effort normal and breath sounds normal.  Abdominal: Soft. Bowel sounds are normal. There is no tenderness.  Musculoskeletal: She exhibits no edema.  Neurological: She is alert and oriented to person, place, and time.  Skin: Skin is warm and dry.  Psychiatric: She has a normal mood and affect.  Nursing note and vitals reviewed.   BP 132/70 mmHg  Pulse 66  Temp(Src) 98.3 F (36.8 C) (Oral)  Ht 4\' 11"  (1.499 m)  Wt 102 lb 2 oz (46.324 kg)  BMI 20.62 kg/m2  SpO2 99% Wt Readings from Last 3 Encounters:  07/15/15 102 lb 2 oz (46.324 kg)  06/20/15 104 lb 8 oz (47.401 kg)  06/09/15 102 lb (46.267 kg)     Lab Results  Component Value Date   WBC 5.0 06/09/2015   HGB 11.9* 06/09/2015   HCT 36.2 06/09/2015   PLT 171 06/09/2015   GLUCOSE 99 06/09/2015   CHOL 80 02/01/2015   TRIG 75.0 02/01/2015   HDL 37.30* 02/01/2015   LDLDIRECT 72.1 01/19/2009   LDLCALC 28 02/01/2015   ALT 17 06/09/2015   AST 21 06/09/2015   NA 137 06/09/2015   K 3.8 06/09/2015   CL 102 06/09/2015   CREATININE 0.92 06/09/2015   BUN  17 06/09/2015   CO2 27 06/09/2015   TSH 4.88* 02/01/2015   INR 2.2 06/24/2015   HGBA1C 5.9 02/01/2015    Lab Results  Component Value Date   TSH 4.88* 02/01/2015   Lab Results  Component Value Date   WBC 5.0 06/09/2015   HGB 11.9* 06/09/2015   HCT 36.2 06/09/2015   MCV 93.8 06/09/2015   PLT 171 06/09/2015   Lab Results  Component Value Date   NA 137 06/09/2015   K 3.8 06/09/2015   CO2 27 06/09/2015   GLUCOSE 99 06/09/2015   BUN 17 06/09/2015   CREATININE 0.92 06/09/2015   BILITOT 0.6 06/09/2015   ALKPHOS 17* 06/09/2015   AST 21 06/09/2015   ALT 17 06/09/2015   PROT 7.4 06/09/2015   ALBUMIN 4.1 06/09/2015   CALCIUM 9.1 06/09/2015   ANIONGAP 8 06/09/2015   GFR 49.17* 04/01/2015   Lab Results  Component Value Date   CHOL 80 02/01/2015   Lab Results  Component Value Date   HDL 37.30* 02/01/2015   Lab Results  Component Value Date   LDLCALC 28 02/01/2015   Lab Results  Component Value Date   TRIG 75.0 02/01/2015   Lab Results  Component Value Date   CHOLHDL 2 02/01/2015   Lab Results  Component Value Date   HGBA1C 5.9 02/01/2015       Assessment & Plan:   Problem List Items Addressed This Visit    Hypothyroidism    On Levothyroxine, continue to monitor      Relevant Orders   Lipid panel   CBC   TSH   Comprehensive metabolic panel   Vitamin 123456   Hyperlipidemia, mixed    Tolerating statin, encouraged heart healthy diet, avoid trans fats, minimize simple carbs and saturated fats. Increase exercise as tolerated      Relevant Orders   Lipid panel   CBC   TSH   Comprehensive metabolic  panel   Vitamin B12   Anemia - Primary   Relevant Orders   Lipid panel   CBC   TSH   Comprehensive metabolic panel   Vitamin 123456   Essential hypertension    Well controlled, no changes to meds. Encouraged heart healthy diet such as the DASH diet and exercise as tolerated.       Relevant Orders   Lipid panel   CBC   TSH   Comprehensive metabolic  panel   Vitamin B12   Hyperglycemia    Eating well, no complaints. Minimize simple carbs      Relevant Orders   Lipid panel   CBC   TSH   Comprehensive metabolic panel   Vitamin 123456      I am having Ms. Marston maintain her vitamin B-12, Vitamin D, zoster vaccine live (PF), pantoprazole, metoprolol tartrate, ramipril, ranitidine, ondansetron, HYDROcodone-acetaminophen, atorvastatin, warfarin, metoprolol tartrate, and ALPRAZolam.  No orders of the defined types were placed in this encounter.     Penni Homans, MD

## 2015-07-15 NOTE — Assessment & Plan Note (Signed)
On Levothyroxine, continue to monitor 

## 2015-07-15 NOTE — Assessment & Plan Note (Signed)
Well controlled, no changes to meds. Encouraged heart healthy diet such as the DASH diet and exercise as tolerated.  °

## 2015-07-15 NOTE — Patient Instructions (Signed)

## 2015-07-15 NOTE — Progress Notes (Signed)
Pre visit review using our clinic review tool, if applicable. No additional management support is needed unless otherwise documented below in the visit note. 

## 2015-07-15 NOTE — Assessment & Plan Note (Signed)
Tolerating statin, encouraged heart healthy diet, avoid trans fats, minimize simple carbs and saturated fats. Increase exercise as tolerated 

## 2015-08-04 ENCOUNTER — Other Ambulatory Visit: Payer: Self-pay | Admitting: Family Medicine

## 2015-08-05 ENCOUNTER — Ambulatory Visit (INDEPENDENT_AMBULATORY_CARE_PROVIDER_SITE_OTHER): Payer: Medicare Other

## 2015-08-05 DIAGNOSIS — Z5181 Encounter for therapeutic drug level monitoring: Secondary | ICD-10-CM | POA: Diagnosis not present

## 2015-08-05 DIAGNOSIS — I4891 Unspecified atrial fibrillation: Secondary | ICD-10-CM

## 2015-08-05 LAB — POCT INR: INR: 2.3

## 2015-08-27 ENCOUNTER — Emergency Department (HOSPITAL_BASED_OUTPATIENT_CLINIC_OR_DEPARTMENT_OTHER)
Admission: EM | Admit: 2015-08-27 | Discharge: 2015-08-27 | Disposition: A | Discharge status: Home / Self Care | Payer: Medicare Other | Attending: Emergency Medicine | Admitting: Emergency Medicine

## 2015-08-27 ENCOUNTER — Emergency Department (HOSPITAL_BASED_OUTPATIENT_CLINIC_OR_DEPARTMENT_OTHER): Payer: Medicare Other

## 2015-08-27 DIAGNOSIS — M549 Dorsalgia, unspecified: Secondary | ICD-10-CM

## 2015-08-27 DIAGNOSIS — I4891 Unspecified atrial fibrillation: Secondary | ICD-10-CM | POA: Insufficient documentation

## 2015-08-27 DIAGNOSIS — Z7901 Long term (current) use of anticoagulants: Secondary | ICD-10-CM | POA: Diagnosis not present

## 2015-08-27 DIAGNOSIS — Z79899 Other long term (current) drug therapy: Secondary | ICD-10-CM | POA: Diagnosis not present

## 2015-08-27 DIAGNOSIS — E039 Hypothyroidism, unspecified: Secondary | ICD-10-CM | POA: Diagnosis not present

## 2015-08-27 DIAGNOSIS — I10 Essential (primary) hypertension: Secondary | ICD-10-CM | POA: Insufficient documentation

## 2015-08-27 LAB — BASIC METABOLIC PANEL
Anion gap: 9 (ref 5–15)
BUN: 22 mg/dL — AB (ref 6–20)
CO2: 27 mmol/L (ref 22–32)
CREATININE: 1.15 mg/dL — AB (ref 0.44–1.00)
Calcium: 9.1 mg/dL (ref 8.9–10.3)
Chloride: 106 mmol/L (ref 101–111)
GFR calc Af Amer: 47 mL/min — ABNORMAL LOW (ref >60)
GFR, EST NON AFRICAN AMERICAN: 41 mL/min — AB (ref >60)
GLUCOSE: 109 mg/dL — AB (ref 65–99)
Potassium: 4 mmol/L (ref 3.5–5.1)
SODIUM: 142 mmol/L (ref 135–145)

## 2015-08-27 LAB — CBC WITH DIFFERENTIAL/PLATELET
BASOS ABS: 0 10*3/uL (ref 0.0–0.1)
Basophils Relative: 0 %
EOS ABS: 0 10*3/uL (ref 0.0–0.7)
EOS PCT: 1 %
HCT: 39.2 % (ref 36.0–46.0)
Hemoglobin: 13.2 g/dL (ref 12.0–15.0)
LYMPHS PCT: 26 %
Lymphs Abs: 1.9 10*3/uL (ref 0.7–4.0)
MCH: 31 pg (ref 26.0–34.0)
MCHC: 33.7 g/dL (ref 30.0–36.0)
MCV: 92 fL (ref 78.0–100.0)
MONO ABS: 0.4 10*3/uL (ref 0.1–1.0)
Monocytes Relative: 6 %
Neutro Abs: 4.7 10*3/uL (ref 1.7–7.7)
Neutrophils Relative %: 67 %
PLATELETS: 157 10*3/uL (ref 150–400)
RBC: 4.26 MIL/uL (ref 3.87–5.11)
RDW: 14.4 % (ref 11.5–15.5)
WBC: 7.1 10*3/uL (ref 4.0–10.5)

## 2015-08-27 LAB — TROPONIN I: Troponin I: <0.03 ng/mL (ref <0.03)

## 2015-08-27 LAB — PROTIME-INR
INR: 1.83
Prothrombin Time: 21.4 seconds — ABNORMAL HIGH (ref 11.4–15.2)

## 2015-08-27 NOTE — ED Notes (Signed)
Pt given a warm blanket 

## 2015-08-27 NOTE — ED Provider Notes (Signed)
[facility] Provider Note   CSN: [id number] Arrival date & time: 08/27/15  0004     History   Chief Complaint Chief Complaint  Patient presents with  . Back Pain    HPI CHRISHAWN BELLE is a 82 y.o. female.  The history is provided by the patient. No language interpreter was used.  Back Pain     ATTISON GIRON is a 82 y.o. female who presents to the Emergency Department complaining of back pain.  She reports 2-3 days of upper back pain. The pain is located across her posterior shoulders and scapulas. This in a minute in nature and spreads from one side to the other. Episodes last about 5 minutes to time and is vague and mild in nature. She noticed that it was much more intense tonight when she went to lay down and that brought her into the emergency department. She denies any fevers, chest pain, shortness of breath, cough, vomiting, nausea, diarrhea. No prior similar symptoms. She has a history of atrial fibrillation and reflux. No history of heart disease.  Past Medical History:  Diagnosis Date  . Anemia   . Anxiety   . Arthritis    hips, knees  . Atrial fibrillation ([place])   . Constipation   . Cough   . Current use of long term anticoagulation   . Diarrhea   . Diverticulitis of colon 02/06/2015  . Diverticulosis   . Dysrhythmia    a-fib  . Gait difficulty   . GERD (gastroesophageal reflux disease)   . Headache(784.0) 06/22/2012  . History of mammogram 2009  . Hyperkalemia 06/28/2012  . Hyperlipidemia   . Hyperlipidemia   . Hypertension   . Hypertension   . IBS (irritable bowel syndrome)   . Incontinence   . Loss of weight 06/05/2015  . Medicare annual wellness visit, subsequent 02/05/2014  . Melena   . Mitral and aortic regurgitation   . Mitral valve prolapse    hx of  . Osteopenia   . Paroxysmal a-fib ([place])   . Personal history of colonic polyps   . Pneumonia   . Pneumonia    organism unspecified  . Renal insufficiency 06/28/2012  . Renal lithiasis  06/26/2015  . Thyroid disease    hypo  . Tracheitis   . Unspecified adverse effect of other drug, medicinal and biological substance(995.29)   . UTI (lower urinary tract infection)   . Vitamin D deficiency     Patient Active Problem List   Diagnosis Date Noted  . Renal lithiasis 06/26/2015  . Loss of weight 06/05/2015  . Diverticulitis of colon 02/06/2015  . Right-sided thoracic back pain 11/15/2014  . Nipple discharge 06/21/2014  . Rib pain on right side 04/29/2014  . Medicare annual wellness visit, subsequent 02/05/2014  . Abdominal pain, chronic, right lower quadrant 02/03/2014  . Rectal bleeding 12/03/2013  . Prolapse of female pelvic organs 12/03/2013  . Leukocytes in urine 11/25/2013  . Abnormal chest x-ray 10/23/2013  . Pulmonary hypertension ([place]) 10/09/2013  . Tricuspid regurgitation 10/09/2013  . Chronic anticoagulation 10/09/2013  . CAP (community acquired pneumonia) 09/08/2013  . Hyperglycemia 08/24/2013  . Encounter for therapeutic drug monitoring 03/13/2013  . Renal insufficiency 06/28/2012  . Hyperkalemia 06/28/2012  . Headache(784.0) 06/22/2012  . Sinus bradycardia 09/20/2011  . Increased urinary frequency 02/13/2011  . Arthritis 11/13/2010  . Long term (current) use of anticoagulants 03/31/2010  . DIZZINESS 08/03/2009  . ACHALASIA 05/18/2009  . DYSPHAGIA UNSPECIFIED 04/11/2009  . Anemia  01/19/2009  . ESOPHAGEAL STRICTURE 01/19/2009  . ANXIETY 09/15/2008  . MITRAL REGURGITATION, MILD 08/20/2008  . PAROXYSMAL ATRIAL FIBRILLATION 08/20/2008  . MITRAL VALVE PROLAPSE, HX OF 08/20/2008  . Vitamin D deficiency 01/15/2008  . IBS 02/03/2007  . Hypothyroidism 01/29/2007  . Hyperlipidemia, mixed 01/29/2007  . Essential hypertension 01/29/2007  . GERD 01/29/2007  . Disorder of bone and cartilage 01/29/2007  . COLONIC POLYPS, HX OF 01/29/2007    Past Surgical History:  Procedure Laterality Date  . BOTOX INJECTION N/A 09/03/2012   Procedure: BOTOX INJECTION;   Surgeon: Inda Castle, MD;  Location: [facility];  Service: Endoscopy;  Laterality: N/A;  . BREAST BIOPSY Left   . BREAST LUMPECTOMY Right 08/18/2014   Procedure: RIGHT BREAST LUMPECTOMY;  Surgeon: Coralie Keens, MD;  Location: [facility];  Service: General;  Laterality: Right;  . CARDIAC ELECTROPHYSIOLOGY MAPPING AND ABLATION    . CATARACT EXTRACTION, BILATERAL    . ESOPHAGOGASTRODUODENOSCOPY N/A 09/03/2012   Procedure: ESOPHAGOGASTRODUODENOSCOPY (EGD);  Surgeon: Inda Castle, MD;  Location: [facility];  Service: Endoscopy;  Laterality: N/A;  . ESOPHAGOSCOPY W/ BOTOX INJECTION    . I&D EXTREMITY Right 11/06/2012   Procedure: IRRIGATION AND DEBRIDEMENT EXTREMITY Right Ring Finger;  Surgeon: Tennis Must, MD;  Location: [facility];  Service: Orthopedics;  Laterality: Right;  . PARTIAL HYSTERECTOMY     ovaries left in place    OB History    No data available       Home Medications    Prior to Admission medications   Medication Sig Start Date End Date Taking? Authorizing Provider  ALPRAZolam Duanne Moron) 0.25 MG tablet TAKE1 TABLET BY MOUTH THREE TIMES DAILY 06/29/15   Mosie Lukes, MD  atorvastatin (LIPITOR) 10 MG tablet TAKE 1 TABLET (10 MG TOTAL) BY MOUTH DAILY AT 6 PM. 08/04/15   Mosie Lukes, MD  Cholecalciferol (VITAMIN D) 2000 UNITS CAPS Take 2,000 Units by mouth every other day. 02/20/12   Mosie Lukes, MD  HYDROcodone-acetaminophen (NORCO/VICODIN) 5-325 MG tablet TAKE ONE TABLET BY MOUTH EVERY 6 HOURS AS NEEDED FOR SEVERE PAIN. 01/27/15   Historical Provider, MD  metoprolol tartrate (LOPRESSOR) 25 MG tablet Take 12.5 mg by mouth 2 (two) times daily.    Historical Provider, MD  metoprolol tartrate (LOPRESSOR) 25 MG tablet TAKE 0.5 TABLETS (12.5 MG TOTAL) BY MOUTH 2 TIMES DAILY. 04/03/15   Mosie Lukes, MD  ondansetron (ZOFRAN) 8 MG tablet Take 1 tablet (8 mg total) by mouth every 8 (eight) hours as needed for nausea or vomiting. 03/10/15   Nelida Meuse III, MD    pantoprazole (PROTONIX) 40 MG tablet TAKE 1 TABLET (40 MG TOTAL) BY MOUTH DAILY. 09/30/14   Mosie Lukes, MD  ramipril (ALTACE) 10 MG capsule TAKE 1 CAPSULE (10 MG TOTAL) BY MOUTH DAILY. 01/23/15   Mosie Lukes, MD  ranitidine (ZANTAC) 150 MG tablet Take 1 tablet (150 mg total) by mouth 2 (two) times daily. 02/01/15   Mosie Lukes, MD  vitamin B-12 (CYANOCOBALAMIN) 1000 MCG tablet Take 1 tablet (1,000 mcg total) by mouth daily. 02/20/12   Mosie Lukes, MD  warfarin (COUMADIN) 2.5 MG tablet TAKE AS DIRECTED BY ANTICOAGULATION CLINIC 03/17/15   Dorothy Spark, MD  zoster vaccine live, PF, (ZOSTAVAX) 65784 UNT/0.65ML injection Inject 19,400 Units into the skin once. 08/31/14   Mosie Lukes, MD    Family History Family History  Problem Relation Age of Onset  . Diabetes  Mother   . CVA Mother   . COPD Father   . Rheum arthritis Father   . Heart disease Maternal Aunt   . Heart disease Maternal Uncle   . Allergies Daughter   . COPD Daughter   . Stroke Daughter   . COPD Daughter     previous smoker  . Stroke Maternal Grandfather   . Colon cancer Neg Hx   . Colon polyps Neg Hx   . Esophageal cancer Neg Hx   . Gallbladder disease Neg Hx   . Kidney disease Neg Hx   . Stroke Mother   . Heart attack Neg Hx   . Hypertension Neg Hx     Social History Social History  Substance Use Topics  . Smoking status: Never Smoker  . Smokeless tobacco: Never Used  . Alcohol use No     Allergies   Darifenacin hydrobromide; Sulfonamide derivatives; and Tramadol   Review of Systems Review of Systems  Musculoskeletal: Positive for back pain.  All other systems reviewed and are negative.    Physical Exam Updated Vital Signs BP 164/75   Pulse 66   Temp 97.7 F (36.5 C) (Oral)   Resp 13   Wt 100 lb (45.4 kg)   SpO2 97%   BMI 20.20 kg/m   Physical Exam  Constitutional: She is oriented to person, place, and time. She appears well-developed and well-nourished.  HENT:  Head:  Normocephalic and atraumatic.  Cardiovascular:  No murmur heard. Irregular rhythm  Pulmonary/Chest: Effort normal. No respiratory distress.  Faint intermittent crackles in the left lung base  Abdominal: Soft. There is no tenderness. There is no rebound and no guarding.  Musculoskeletal: She exhibits no edema or tenderness.  Kyphosis. No tenderness to palpation throughout the back. Upper back pain is reproducible when she lay supine.  Neurological: She is alert and oriented to person, place, and time.  Skin: Skin is warm and dry.  Psychiatric: She has a normal mood and affect. Her behavior is normal.  Nursing note and vitals reviewed.    ED Treatments / Results  Labs (all labs ordered are listed, but only abnormal results are displayed) Labs Reviewed  BASIC METABOLIC PANEL - Abnormal; Notable for the following:       Result Value   Glucose, Bld 109 (*)    BUN 22 (*)    Creatinine, Ser 1.15 (*)    GFR calc non Af Amer 41 (*)    GFR calc Af Amer 47 (*)    All other components within normal limits  PROTIME-INR - Abnormal; Notable for the following:    Prothrombin Time 21.4 (*)    All other components within normal limits  CBC WITH DIFFERENTIAL/PLATELET  TROPONIN I  TROPONIN I    EKG  EKG Interpretation  Date/Time:  Saturday August 27 2015 02:39:58 EDT Ventricular Rate:  80 PR Interval:    QRS Duration: 98 QT Interval:  386 QTC Calculation: 446 R Axis:   -4 Text Interpretation:  Atrial fibrillation Anteroseptal infarct, old Borderline repolarization abnormality Confirmed by [place]le Coca [id number]) on 08/27/2015 3:03:16 AM       Radiology Dg Chest 2 View  Result Date: 08/27/2015 CLINICAL DATA:  Back pain.  Acute on chronic pain.  No know injury. EXAM: CHEST  2 VIEW COMPARISON:  Chest radiograph 04/29/2014 FINDINGS: Heart is normal in size. Unchanged mediastinal contours with prominent bilateral hila likely secondary prominent pulmonary arteries. Atherosclerosis of the  thoracic aorta. Moderate to large retrocardiac hiatal hernia  is again seen. No pleural effusion, focal airspace disease, or pneumothorax. Mild biapical pleural parenchymal scarring. Exaggerated thoracic kyphosis with degenerative disc disease. No compression deformity or evidence of acute osseous abnormality. IMPRESSION: Stable exam, no explanation for back pain. Retrocardiac hiatal hernia again seen. Electronically Signed   By: Jeb Levering M.D.   On: 08/27/2015 02:46    Procedures Procedures (including critical care time)  Medications Ordered in ED Medications - No data to display   Initial Impression / Assessment and Plan / ED Course  I have reviewed the triage vital signs and the nursing notes.  Pertinent labs & imaging results that were available during my care of the patient were reviewed by me and considered in my medical decision making (see chart for details).  Clinical Course    Patient here for evaluation of upper back pain that is worse with lying supine. Pain is reproducible the emergency department when she lay supine. No evidence of acute pneumonia. Presentation is not consistent with ACS or PE. Discussed with patient musculoskeletal back pain. Recommend treatment with Tylenol with outpatient follow-up. Return precautions discussed.  Final Clinical Impressions(s) / ED Diagnoses   Final diagnoses:  Upper back pain    New Prescriptions Discharge Medication List as of 08/27/2015  5:44 AM       Quintella Reichert, MD 08/27/15 [phone]

## 2015-08-27 NOTE — ED Triage Notes (Signed)
Pt reports back pain chronic in nature that has progressively worsen lately

## 2015-08-29 ENCOUNTER — Ambulatory Visit (INDEPENDENT_AMBULATORY_CARE_PROVIDER_SITE_OTHER): Payer: Medicare Other | Admitting: Pharmacist

## 2015-08-29 DIAGNOSIS — Z5181 Encounter for therapeutic drug level monitoring: Secondary | ICD-10-CM

## 2015-08-29 DIAGNOSIS — I4891 Unspecified atrial fibrillation: Secondary | ICD-10-CM

## 2015-09-08 ENCOUNTER — Other Ambulatory Visit: Payer: Self-pay | Admitting: Cardiology

## 2015-09-15 ENCOUNTER — Ambulatory Visit (INDEPENDENT_AMBULATORY_CARE_PROVIDER_SITE_OTHER): Payer: Medicare Other | Admitting: Family Medicine

## 2015-09-15 ENCOUNTER — Other Ambulatory Visit: Payer: Self-pay | Admitting: Family Medicine

## 2015-09-15 ENCOUNTER — Emergency Department (HOSPITAL_BASED_OUTPATIENT_CLINIC_OR_DEPARTMENT_OTHER)
Admission: EM | Admit: 2015-09-15 | Discharge: 2015-09-15 | Disposition: A | Discharge status: Home / Self Care | Payer: Medicare Other | Attending: Emergency Medicine | Admitting: Emergency Medicine

## 2015-09-15 ENCOUNTER — Encounter (HOSPITAL_BASED_OUTPATIENT_CLINIC_OR_DEPARTMENT_OTHER): Payer: Self-pay

## 2015-09-15 VITALS — BP 170/68 | HR 86 | Temp 98.4°F | Ht 59.0 in | Wt 104.6 lb

## 2015-09-15 DIAGNOSIS — R350 Frequency of micturition: Secondary | ICD-10-CM | POA: Diagnosis not present

## 2015-09-15 DIAGNOSIS — R5382 Chronic fatigue, unspecified: Secondary | ICD-10-CM | POA: Diagnosis not present

## 2015-09-15 DIAGNOSIS — I10 Essential (primary) hypertension: Secondary | ICD-10-CM | POA: Insufficient documentation

## 2015-09-15 DIAGNOSIS — R531 Weakness: Secondary | ICD-10-CM | POA: Diagnosis not present

## 2015-09-15 DIAGNOSIS — I951 Orthostatic hypotension: Secondary | ICD-10-CM | POA: Diagnosis not present

## 2015-09-15 DIAGNOSIS — Z79899 Other long term (current) drug therapy: Secondary | ICD-10-CM | POA: Diagnosis not present

## 2015-09-15 DIAGNOSIS — R5383 Other fatigue: Secondary | ICD-10-CM | POA: Diagnosis present

## 2015-09-15 LAB — POC URINALSYSI DIPSTICK (AUTOMATED)
BILIRUBIN UA: NEGATIVE
Blood, UA: NEGATIVE
Glucose, UA: NEGATIVE
KETONES UA: NEGATIVE
Leukocytes, UA: NEGATIVE
Nitrite, UA: NEGATIVE
PH UA: 7
PROTEIN UA: NEGATIVE
Spec Grav, UA: 1.015
Urobilinogen, UA: NEGATIVE

## 2015-09-15 LAB — COMPREHENSIVE METABOLIC PANEL
ALBUMIN: 4.2 g/dL (ref 3.5–5.0)
ALK PHOS: 16 U/L — AB (ref 38–126)
ALT: 19 U/L (ref 14–54)
ANION GAP: 9 (ref 5–15)
AST: 28 U/L (ref 15–41)
BILIRUBIN TOTAL: 0.7 mg/dL (ref 0.3–1.2)
BUN: 16 mg/dL (ref 6–20)
CALCIUM: 9.3 mg/dL (ref 8.9–10.3)
CO2: 27 mmol/L (ref 22–32)
Chloride: 104 mmol/L (ref 101–111)
Creatinine, Ser: 1.09 mg/dL — ABNORMAL HIGH (ref 0.44–1.00)
GFR calc non Af Amer: 44 mL/min — ABNORMAL LOW (ref >60)
GFR, EST AFRICAN AMERICAN: 51 mL/min — AB (ref >60)
GLUCOSE: 110 mg/dL — AB (ref 65–99)
POTASSIUM: 3.9 mmol/L (ref 3.5–5.1)
SODIUM: 140 mmol/L (ref 135–145)
TOTAL PROTEIN: 7.5 g/dL (ref 6.5–8.1)

## 2015-09-15 LAB — CBC WITH DIFFERENTIAL/PLATELET
BASOS PCT: 0 %
Basophils Absolute: 0 10*3/uL (ref 0.0–0.1)
EOS ABS: 0 10*3/uL (ref 0.0–0.7)
Eosinophils Relative: 0 %
HEMATOCRIT: 37.3 % (ref 36.0–46.0)
Hemoglobin: 12.6 g/dL (ref 12.0–15.0)
LYMPHS PCT: 13 %
Lymphs Abs: 1.1 10*3/uL (ref 0.7–4.0)
MCH: 31.2 pg (ref 26.0–34.0)
MCHC: 33.8 g/dL (ref 30.0–36.0)
MCV: 92.3 fL (ref 78.0–100.0)
MONO ABS: 0.5 10*3/uL (ref 0.1–1.0)
Monocytes Relative: 7 %
NEUTROS ABS: 6.6 10*3/uL (ref 1.7–7.7)
NEUTROS PCT: 80 %
Platelets: 171 10*3/uL (ref 150–400)
RBC: 4.04 MIL/uL (ref 3.87–5.11)
RDW: 14.4 % (ref 11.5–15.5)
WBC: 8.3 10*3/uL (ref 4.0–10.5)

## 2015-09-15 NOTE — Patient Instructions (Signed)
Please do to the ER downstairs for further evaluation.  I hope that you feel better soon!

## 2015-09-15 NOTE — ED Notes (Signed)
Attempted IV access x 1 without success due to infiltration.

## 2015-09-15 NOTE — ED Notes (Signed)
Daughter at bedside however she is leaving to go to work and patient other daughter will be coming shortly.

## 2015-09-15 NOTE — ED Triage Notes (Signed)
Pt c/o "just not feeling well" x today-denies pain-c/o urinary freq (was advised by PCP office "urine is fine)-sent from PCP office in the building for evaluation-NAD-steady gait

## 2015-09-15 NOTE — ED Provider Notes (Addendum)
[facility] DEPT [facility] Provider Note   CSN: [id number] Arrival date & time: 09/15/15  1157     History   Chief Complaint Chief Complaint  Patient presents with  . Fatigue    HPI [name] [name] is a 82 y.o. female.  HPI Patient was sent down from her primary care office for evaluation and blood work.  Patient had episodes last night where she was going to the bathroom frequently.  She denies any thirst.  She denies dysuria.  Denies fever or flank pain.  Patient felt fatigued and with generalized weakness.  Said she "just doesn't feel good".  Patient has history of diverticulosis but has had no diarrhea or blood in her stool. Past Medical History:  Diagnosis Date  . Anemia   . Anxiety   . Arthritis    hips, knees  . Atrial fibrillation ([place])   . Constipation   . Cough   . Current use of long term anticoagulation   . Diarrhea   . Diverticulitis of colon 02/06/2015  . Diverticulosis   . Dysrhythmia    a-fib  . Gait difficulty   . GERD (gastroesophageal reflux disease)   . Headache(784.0) 06/22/2012  . History of mammogram 2009  . Hyperkalemia 06/28/2012  . Hyperlipidemia   . Hyperlipidemia   . Hypertension   . Hypertension   . IBS (irritable bowel syndrome)   . Incontinence   . Loss of weight 06/05/2015  . Medicare annual wellness visit, subsequent 02/05/2014  . Melena   . Mitral and aortic regurgitation   . Mitral valve prolapse    hx of  . Osteopenia   . Paroxysmal a-fib ([place])   . Personal history of colonic polyps   . Pneumonia   . Pneumonia    organism unspecified  . Renal insufficiency 06/28/2012  . Renal lithiasis 06/26/2015  . Thyroid disease    hypo  . Tracheitis   . Unspecified adverse effect of other drug, medicinal and biological substance(995.29)   . UTI (lower urinary tract infection)   . Vitamin D deficiency     Patient Active Problem List   Diagnosis Date Noted  . Renal lithiasis 06/26/2015  . Loss of weight 06/05/2015  . Diverticulitis of  colon 02/06/2015  . Right-sided thoracic back pain 11/15/2014  . Nipple discharge 06/21/2014  . Rib pain on right side 04/29/2014  . Medicare annual wellness visit, subsequent 02/05/2014  . Abdominal pain, chronic, right lower quadrant 02/03/2014  . Rectal bleeding 12/03/2013  . Prolapse of female pelvic organs 12/03/2013  . Leukocytes in urine 11/25/2013  . Abnormal chest x-ray 10/23/2013  . Pulmonary hypertension ([place]) 10/09/2013  . Tricuspid regurgitation 10/09/2013  . Chronic anticoagulation 10/09/2013  . CAP (community acquired pneumonia) 09/08/2013  . Hyperglycemia 08/24/2013  . Encounter for therapeutic drug monitoring 03/13/2013  . Renal insufficiency 06/28/2012  . Hyperkalemia 06/28/2012  . Headache(784.0) 06/22/2012  . Sinus bradycardia 09/20/2011  . Increased urinary frequency 02/13/2011  . Arthritis 11/13/2010  . Long term (current) use of anticoagulants 03/31/2010  . DIZZINESS 08/03/2009  . ACHALASIA 05/18/2009  . DYSPHAGIA UNSPECIFIED 04/11/2009  . Anemia 01/19/2009  . ESOPHAGEAL STRICTURE 01/19/2009  . ANXIETY 09/15/2008  . MITRAL REGURGITATION, MILD 08/20/2008  . PAROXYSMAL ATRIAL FIBRILLATION 08/20/2008  . MITRAL VALVE PROLAPSE, HX OF 08/20/2008  . Vitamin D deficiency 01/15/2008  . IBS 02/03/2007  . Hypothyroidism 01/29/2007  . Hyperlipidemia, mixed 01/29/2007  . Essential hypertension 01/29/2007  . GERD 01/29/2007  . Disorder of bone and cartilage  01/29/2007  . COLONIC POLYPS, HX OF 01/29/2007    Past Surgical History:  Procedure Laterality Date  . BOTOX INJECTION N/A 09/03/2012   Procedure: BOTOX INJECTION;  Surgeon: Inda Castle, MD;  Location: [facility];  Service: Endoscopy;  Laterality: N/A;  . BREAST BIOPSY Left   . BREAST LUMPECTOMY Right 08/18/2014   Procedure: RIGHT BREAST LUMPECTOMY;  Surgeon: Coralie Keens, MD;  Location: [facility];  Service: General;  Laterality: Right;  . CARDIAC ELECTROPHYSIOLOGY MAPPING AND  ABLATION    . CATARACT EXTRACTION, BILATERAL    . ESOPHAGOGASTRODUODENOSCOPY N/A 09/03/2012   Procedure: ESOPHAGOGASTRODUODENOSCOPY (EGD);  Surgeon: Inda Castle, MD;  Location: [facility];  Service: Endoscopy;  Laterality: N/A;  . ESOPHAGOSCOPY W/ BOTOX INJECTION    . I&D EXTREMITY Right 11/06/2012   Procedure: IRRIGATION AND DEBRIDEMENT EXTREMITY Right Ring Finger;  Surgeon: Tennis Must, MD;  Location: [facility];  Service: Orthopedics;  Laterality: Right;  . PARTIAL HYSTERECTOMY     ovaries left in place    OB History    No data available       Home Medications    Prior to Admission medications   Medication Sig Start Date End Date Taking? Authorizing Provider  ALPRAZolam Duanne Moron) 0.25 MG tablet TAKE1 TABLET BY MOUTH THREE TIMES DAILY 06/29/15   Mosie Lukes, MD  atorvastatin (LIPITOR) 10 MG tablet TAKE 1 TABLET (10 MG TOTAL) BY MOUTH DAILY AT 6 PM. 08/04/15   Mosie Lukes, MD  Cholecalciferol (VITAMIN D) 2000 UNITS CAPS Take 2,000 Units by mouth every other day. 02/20/12   Mosie Lukes, MD  HYDROcodone-acetaminophen (NORCO/VICODIN) 5-325 MG tablet TAKE ONE TABLET BY MOUTH EVERY 6 HOURS AS NEEDED FOR SEVERE PAIN. 01/27/15   Historical Provider, MD  metoprolol tartrate (LOPRESSOR) 25 MG tablet Take 12.5 mg by mouth 2 (two) times daily.    Historical Provider, MD  metoprolol tartrate (LOPRESSOR) 25 MG tablet TAKE 0.5 TABLETS (12.5 MG TOTAL) BY MOUTH 2 TIMES DAILY. 04/03/15   Mosie Lukes, MD  ondansetron (ZOFRAN) 8 MG tablet Take 1 tablet (8 mg total) by mouth every 8 (eight) hours as needed for nausea or vomiting. 03/10/15   Nelida Meuse III, MD  pantoprazole (PROTONIX) 40 MG tablet TAKE 1 TABLET (40 MG TOTAL) BY MOUTH DAILY. 09/15/15   Mosie Lukes, MD  ramipril (ALTACE) 10 MG capsule TAKE 1 CAPSULE (10 MG TOTAL) BY MOUTH DAILY. 01/23/15   Mosie Lukes, MD  ranitidine (ZANTAC) 150 MG tablet Take 1 tablet (150 mg total) by mouth 2 (two) times daily. 02/01/15   Mosie Lukes, MD    vitamin B-12 (CYANOCOBALAMIN) 1000 MCG tablet Take 1 tablet (1,000 mcg total) by mouth daily. 02/20/12   Mosie Lukes, MD  warfarin (COUMADIN) 2.5 MG tablet TAKE AS DIRECTED BY ANTICOAGULATION CLINIC 09/08/15   Dorothy Spark, MD    Family History Family History  Problem Relation Age of Onset  . Diabetes Mother   . CVA Mother   . Stroke Mother   . COPD Father   . Rheum arthritis Father   . Allergies Daughter   . COPD Daughter   . Stroke Daughter   . COPD Daughter     previous smoker  . Stroke Maternal Grandfather   . Heart disease Maternal Aunt   . Heart disease Maternal Uncle   . Colon cancer Neg Hx   . Colon polyps Neg Hx   . Esophageal cancer Neg Hx   .  Gallbladder disease Neg Hx   . Kidney disease Neg Hx   . Heart attack Neg Hx   . Hypertension Neg Hx     Social History Social History  Substance Use Topics  . Smoking status: Never Smoker  . Smokeless tobacco: Never Used  . Alcohol use No     Allergies   Darifenacin hydrobromide; Sulfonamide derivatives; and Tramadol   Review of Systems Review of Systems  Constitutional: Negative for fever.  Musculoskeletal: Negative for back pain, neck pain and neck stiffness.  Neurological: Negative for speech difficulty and headaches.   All other systems reviewed and are negative  Physical Exam Updated Vital Signs BP 157/71 (BP Location: Left Arm)   Pulse 82   Temp 97.9 F (36.6 C) (Oral)   Resp 16   Ht 4\' 11"  (1.499 m)   Wt 104 lb (47.2 kg)   SpO2 95%   BMI 21.01 kg/m   Physical Exam Physical Exam  Nursing note and vitals reviewed. Constitutional: She is oriented to person, place, and time. She appears well-developed and well-nourished. No distress.  HENT:  Head: Normocephalic and atraumatic.  Eyes: Pupils are equal, round, and reactive to light.  Neck: Normal range of motion.  Cardiovascular: Normal rate and intact distal pulses.   Pulmonary/Chest: No respiratory distress.  Abdominal: Normal  appearance. She exhibits no distension.  Musculoskeletal: Normal range of motion.  Neurological: She is alert and oriented to person, place, and time. No cranial nerve deficit.  Skin: Skin is warm and dry. No rash noted.  Psychiatric: She has a normal mood and affect. Her behavior is normal.    ED Treatments / Results  Labs (all labs ordered are listed, but only abnormal results are displayed) Labs Reviewed  COMPREHENSIVE METABOLIC PANEL - Abnormal; Notable for the following:       Result Value   Glucose, Bld 110 (*)    Creatinine, Ser 1.09 (*)    Alkaline Phosphatase 16 (*)    GFR calc non Af Amer 44 (*)    GFR calc Af Amer 51 (*)    All other components within normal limits  CBC WITH DIFFERENTIAL/PLATELET    EKG  Date: 09/15/2015  Rate: 79  Rhythm: Atrial fibrillation  QRS Axis: normal  Intervals: normal  ST/T Wave abnormalities: Probable old anteroseptal infarct with minimal ST depression  Conduction Disutrbances: none  Narrative Interpretation: Abnormal ECG        Radiology No results found.  Procedures Procedures (including critical care time)  Medications Ordered in ED Medications - No data to display   Initial Impression / Assessment and Plan / ED Course  I have reviewed the triage vital signs and the nursing notes.  Pertinent labs & imaging results that were available during my care of the patient were reviewed by me and considered in my medical decision making (see chart for details).  Clinical Course   Patient stable for discharge and will return to see her primary care doctor or return here if new symptoms occur.   Final Clinical Impressions(s) / ED Diagnoses   Final diagnoses:  Chronic fatigue  Generalized weakness    New Prescriptions New Prescriptions   No medications on file         Leonard Schwartz, MD 09/15/15 1515

## 2015-09-15 NOTE — Progress Notes (Addendum)
[facility] at [facility] [address], [place], [place] [address] [id number] [phone]  Date:  09/15/2015   Name:  [name] [name]   DOB:  1924-04-22   MRN:  [id number]  PCP:  Penni Homans, MD    Chief Complaint: Urinary Frequency (c/o increased urinary frequency. pt states that sx's started around 4am this morning and pt has had to urinate at least 6 -7 times this morning. )   History of Present Illness:  [name] [name] is a 82 y.o. very pleasant female patient who presents with the following:  Here today with concern about urinary frequency - she first noted this sx early this morning. States that she was urinating every hour since about 3am.  No dysuria or urgency really.  She did notice some pain in her belly last night, but this is now resolved. States that she does not feel well.  She has a hard time describing this but states she just does not feel like she normally does. She feels weak and tired.   She did have a bit of a cough this am, but she thinks this may be due to her reflux. However she also notes a history of "frequent pneumonia."  Also, this spring she had a prolonged problem with diverticulitis and was on abx for "4 months." She notes that "when I get up and move around my heart will race."  No hematuria   No fever or vomiting noted.   She feels that she is eating and drinking ok but admits that she does not drink a whole lot of fluid  Patient Active Problem List   Diagnosis Date Noted  . Renal lithiasis 06/26/2015  . Loss of weight 06/05/2015  . Diverticulitis of colon 02/06/2015  . Right-sided thoracic back pain 11/15/2014  . Nipple discharge 06/21/2014  . Rib pain on right side 04/29/2014  . Medicare annual wellness visit, subsequent 02/05/2014  . Abdominal pain, chronic, right lower quadrant 02/03/2014  . Rectal bleeding 12/03/2013  . Prolapse of female pelvic organs 12/03/2013  . Leukocytes in urine 11/25/2013  .  Abnormal chest x-ray 10/23/2013  . Pulmonary hypertension ([place]) 10/09/2013  . Tricuspid regurgitation 10/09/2013  . Chronic anticoagulation 10/09/2013  . CAP (community acquired pneumonia) 09/08/2013  . Hyperglycemia 08/24/2013  . Encounter for therapeutic drug monitoring 03/13/2013  . Renal insufficiency 06/28/2012  . Hyperkalemia 06/28/2012  . Headache(784.0) 06/22/2012  . Sinus bradycardia 09/20/2011  . Increased urinary frequency 02/13/2011  . Arthritis 11/13/2010  . Long term (current) use of anticoagulants 03/31/2010  . DIZZINESS 08/03/2009  . ACHALASIA 05/18/2009  . DYSPHAGIA UNSPECIFIED 04/11/2009  . Anemia 01/19/2009  . ESOPHAGEAL STRICTURE 01/19/2009  . ANXIETY 09/15/2008  . MITRAL REGURGITATION, MILD 08/20/2008  . PAROXYSMAL ATRIAL FIBRILLATION 08/20/2008  . MITRAL VALVE PROLAPSE, HX OF 08/20/2008  . Vitamin D deficiency 01/15/2008  . IBS 02/03/2007  . Hypothyroidism 01/29/2007  . Hyperlipidemia, mixed 01/29/2007  . Essential hypertension 01/29/2007  . GERD 01/29/2007  . Disorder of bone and cartilage 01/29/2007  . COLONIC POLYPS, HX OF 01/29/2007    Past Medical History:  Diagnosis Date  . Anemia   . Anxiety   . Arthritis    hips, knees  . Atrial fibrillation ([place])   . Constipation   . Cough   . Current use of long term anticoagulation   . Diarrhea   . Diverticulitis of colon 02/06/2015  . Diverticulosis   . Dysrhythmia  a-fib  . Gait difficulty   . GERD (gastroesophageal reflux disease)   . Headache(784.0) 06/22/2012  . History of mammogram 2009  . Hyperkalemia 06/28/2012  . Hyperlipidemia   . Hyperlipidemia   . Hypertension   . Hypertension   . IBS (irritable bowel syndrome)   . Incontinence   . Loss of weight 06/05/2015  . Medicare annual wellness visit, subsequent 02/05/2014  . Melena   . Mitral and aortic regurgitation   . Mitral valve prolapse    hx of  . Osteopenia   . Paroxysmal a-fib ([place])   . Personal history of colonic polyps   .  Pneumonia   . Pneumonia    organism unspecified  . Renal insufficiency 06/28/2012  . Renal lithiasis 06/26/2015  . Thyroid disease    hypo  . Tracheitis   . Unspecified adverse effect of other drug, medicinal and biological substance(995.29)   . UTI (lower urinary tract infection)   . Vitamin D deficiency     Past Surgical History:  Procedure Laterality Date  . BOTOX INJECTION N/A 09/03/2012   Procedure: BOTOX INJECTION;  Surgeon: Inda Castle, MD;  Location: [facility];  Service: Endoscopy;  Laterality: N/A;  . BREAST BIOPSY Left   . BREAST LUMPECTOMY Right 08/18/2014   Procedure: RIGHT BREAST LUMPECTOMY;  Surgeon: Coralie Keens, MD;  Location: [facility];  Service: General;  Laterality: Right;  . CARDIAC ELECTROPHYSIOLOGY MAPPING AND ABLATION    . CATARACT EXTRACTION, BILATERAL    . ESOPHAGOGASTRODUODENOSCOPY N/A 09/03/2012   Procedure: ESOPHAGOGASTRODUODENOSCOPY (EGD);  Surgeon: Inda Castle, MD;  Location: [facility];  Service: Endoscopy;  Laterality: N/A;  . ESOPHAGOSCOPY W/ BOTOX INJECTION    . I&D EXTREMITY Right 11/06/2012   Procedure: IRRIGATION AND DEBRIDEMENT EXTREMITY Right Ring Finger;  Surgeon: Tennis Must, MD;  Location: [facility];  Service: Orthopedics;  Laterality: Right;  . PARTIAL HYSTERECTOMY     ovaries left in place    Social History  Substance Use Topics  . Smoking status: Never Smoker  . Smokeless tobacco: Never Used  . Alcohol use No    Family History  Problem Relation Age of Onset  . Diabetes Mother   . CVA Mother   . COPD Father   . Rheum arthritis Father   . Heart disease Maternal Aunt   . Heart disease Maternal Uncle   . Allergies Daughter   . COPD Daughter   . Stroke Daughter   . COPD Daughter     previous smoker  . Stroke Maternal Grandfather   . Colon cancer Neg Hx   . Colon polyps Neg Hx   . Esophageal cancer Neg Hx   . Gallbladder disease Neg Hx   . Kidney disease Neg Hx   . Stroke Mother   . Heart attack  Neg Hx   . Hypertension Neg Hx     Allergies  Allergen Reactions  . Darifenacin Hydrobromide     REACTION: causes her dizziness  . Sulfonamide Derivatives   . Tramadol Other (See Comments)    Insomnia, anorexia    Medication list has been reviewed and updated.  Current Outpatient Prescriptions on File Prior to Visit  Medication Sig Dispense Refill  . ALPRAZolam (XANAX) 0.25 MG tablet TAKE1 TABLET BY MOUTH THREE TIMES DAILY 90 tablet 1  . atorvastatin (LIPITOR) 10 MG tablet TAKE 1 TABLET (10 MG TOTAL) BY MOUTH DAILY AT 6 PM. 30 tablet 4  . Cholecalciferol (VITAMIN D) 2000 UNITS CAPS Take 2,000  Units by mouth every other day.    Marland Kitchen HYDROcodone-acetaminophen (NORCO/VICODIN) 5-325 MG tablet TAKE ONE TABLET BY MOUTH EVERY 6 HOURS AS NEEDED FOR SEVERE PAIN.  0  . metoprolol tartrate (LOPRESSOR) 25 MG tablet Take 12.5 mg by mouth 2 (two) times daily.    . metoprolol tartrate (LOPRESSOR) 25 MG tablet TAKE 0.5 TABLETS (12.5 MG TOTAL) BY MOUTH 2 TIMES DAILY. 90 tablet 1  . ondansetron (ZOFRAN) 8 MG tablet Take 1 tablet (8 mg total) by mouth every 8 (eight) hours as needed for nausea or vomiting. 30 tablet 0  . pantoprazole (PROTONIX) 40 MG tablet TAKE 1 TABLET (40 MG TOTAL) BY MOUTH DAILY. 90 tablet 3  . ramipril (ALTACE) 10 MG capsule TAKE 1 CAPSULE (10 MG TOTAL) BY MOUTH DAILY. 90 capsule 3  . ranitidine (ZANTAC) 150 MG tablet Take 1 tablet (150 mg total) by mouth 2 (two) times daily. 30 tablet 3  . vitamin B-12 (CYANOCOBALAMIN) 1000 MCG tablet Take 1 tablet (1,000 mcg total) by mouth daily.    Marland Kitchen warfarin (COUMADIN) 2.5 MG tablet TAKE AS DIRECTED BY ANTICOAGULATION CLINIC 40 tablet 3  . zoster vaccine live, PF, (ZOSTAVAX) 16109 UNT/0.65ML injection Inject 19,400 Units into the skin once. 1 each 0   No current facility-administered medications on file prior to visit.     Review of Systems:  As per HPI- otherwise negative.   Physical Examination: Vitals:   09/15/15 1119  BP: (!) 155/71   Pulse: 86  Temp: 98.4 F (36.9 C)   Vitals:   09/15/15 1119  Weight: 104 lb 9.6 oz (47.4 kg)  Height: 4\' 11"  (1.499 m)   Body mass index is 21.13 kg/m. Ideal Body Weight: Weight in (lb) to have BMI = 25: 123.5  GEN: WDWN, NAD, Non-toxic, A & O x 3, petite build, looks well HEENT: Atraumatic, Normocephalic. Neck supple. No masses, No LAD.  Bilateral TM wnl, oropharynx normal.  PEERL,EOMI.   Ears and Nose: No external deformity. CV: RRR, No M/G/R. No JVD. No thrill. No extra heart sounds. PULM: CTA B, no wheezes, crackles, rhonchi. No retractions. No resp. distress. No accessory muscle use. ABD: S, NT, ND, +BS. No rebound. No HSM. Benign belly, no CVA tenderness  EXTR: No c/c/e NEURO Normal gait.  PSYCH: Normally interactive. Conversant. Not depressed or anxious appearing.  Calm demeanor.   Laying 160/72, 88   Sitting 160/72, 87     Standing 125/ 70, 87  UA is clear   Assessment and Plan: Urinary frequency - Plan: Urine culture  Orthostatic hypotension  Elderly lady with non -specific malaise and orthostatic hypotension Her urine looks completely clear.  Will send for a culture from my office, but cannot assume that she has a UTI to explain her sx She will proceed to the ER for further eval, labs, possible IVF.  Pt is ok with this plan  Appreciate ER care of this nice patient.    Signed Lamar Blinks, MD  Received her urine culture and gave her a call 9/16- negative urine.  No answer, could not LMOM> will send her a letter

## 2015-09-16 ENCOUNTER — Ambulatory Visit (INDEPENDENT_AMBULATORY_CARE_PROVIDER_SITE_OTHER): Payer: Medicare Other | Admitting: Pharmacist

## 2015-09-16 DIAGNOSIS — I4891 Unspecified atrial fibrillation: Secondary | ICD-10-CM

## 2015-09-16 DIAGNOSIS — Z5181 Encounter for therapeutic drug level monitoring: Secondary | ICD-10-CM

## 2015-09-16 LAB — POCT INR: INR: 2

## 2015-09-17 ENCOUNTER — Encounter: Payer: Self-pay | Admitting: Family Medicine

## 2015-09-17 LAB — URINE CULTURE: ORGANISM ID, BACTERIA: NO GROWTH

## 2015-09-19 ENCOUNTER — Other Ambulatory Visit: Payer: Self-pay | Admitting: Family Medicine

## 2015-09-19 NOTE — Telephone Encounter (Signed)
Faxed hardcopy for Alprazolam to [facility] in [facility].

## 2015-09-19 NOTE — Telephone Encounter (Signed)
Requesting:  Alprazolam Contract   02/05/2014 UDS   Low risk Last OV   07/15/2015 Last Refill    #90 with 1 refill on 06/29/2015  Please Advise

## 2015-09-23 ENCOUNTER — Other Ambulatory Visit: Payer: Self-pay | Admitting: Family Medicine

## 2015-10-25 ENCOUNTER — Encounter: Payer: Self-pay | Admitting: Cardiology

## 2015-10-26 ENCOUNTER — Other Ambulatory Visit: Payer: Self-pay | Admitting: Family Medicine

## 2015-10-26 NOTE — Telephone Encounter (Signed)
Last seen 09/15/15 Last filled #[phone]- 1 rf Please advise

## 2015-10-27 NOTE — Telephone Encounter (Signed)
Faxed hardcopy to [facility] in [place] [place].

## 2015-10-28 ENCOUNTER — Ambulatory Visit (INDEPENDENT_AMBULATORY_CARE_PROVIDER_SITE_OTHER): Payer: Medicare Other | Admitting: *Deleted

## 2015-10-28 DIAGNOSIS — Z5181 Encounter for therapeutic drug level monitoring: Secondary | ICD-10-CM

## 2015-10-28 DIAGNOSIS — I4891 Unspecified atrial fibrillation: Secondary | ICD-10-CM

## 2015-10-28 LAB — POCT INR: INR: 3.1

## 2015-11-04 ENCOUNTER — Encounter: Payer: Self-pay | Admitting: Cardiology

## 2015-11-04 ENCOUNTER — Ambulatory Visit (INDEPENDENT_AMBULATORY_CARE_PROVIDER_SITE_OTHER): Payer: Medicare Other | Admitting: Cardiology

## 2015-11-04 VITALS — BP 152/70 | HR 48 | Ht 59.0 in | Wt 106.0 lb

## 2015-11-04 DIAGNOSIS — I481 Persistent atrial fibrillation: Secondary | ICD-10-CM | POA: Diagnosis not present

## 2015-11-04 DIAGNOSIS — Z7901 Long term (current) use of anticoagulants: Secondary | ICD-10-CM

## 2015-11-04 DIAGNOSIS — I4819 Other persistent atrial fibrillation: Secondary | ICD-10-CM

## 2015-11-04 DIAGNOSIS — E782 Mixed hyperlipidemia: Secondary | ICD-10-CM | POA: Diagnosis not present

## 2015-11-04 DIAGNOSIS — I10 Essential (primary) hypertension: Secondary | ICD-10-CM

## 2015-11-04 NOTE — Patient Instructions (Signed)

## 2015-11-04 NOTE — Progress Notes (Signed)
Patient ID: ANAVAE RACY, female   DOB: 09-24-1924, 82 y.o.   MRN: [id number]      Patient Name: [name] [name] Date of Encounter: 11/04/2015  Primary Care Provider:  Penni Homans, MD Primary Cardiologist:  Ena Dawley  Patient Profile  1 year follow up  Problem List   Past Medical History:  Diagnosis Date  . Anemia   . Anxiety   . Arthritis    hips, knees  . Atrial fibrillation ([place])   . Constipation   . Cough   . Current use of long term anticoagulation   . Diarrhea   . Diverticulitis of colon 02/06/2015  . Diverticulosis   . Dysrhythmia    a-fib  . Gait difficulty   . GERD (gastroesophageal reflux disease)   . Headache(784.0) 06/22/2012  . History of mammogram 2009  . Hyperkalemia 06/28/2012  . Hyperlipidemia   . Hyperlipidemia   . Hypertension   . Hypertension   . IBS (irritable bowel syndrome)   . Incontinence   . Loss of weight 06/05/2015  . Medicare annual wellness visit, subsequent 02/05/2014  . Melena   . Mitral and aortic regurgitation   . Mitral valve prolapse    hx of  . Osteopenia   . Paroxysmal a-fib ([place])   . Personal history of colonic polyps   . Pneumonia   . Pneumonia    organism unspecified  . Renal insufficiency 06/28/2012  . Renal lithiasis 06/26/2015  . Thyroid disease    hypo  . Tracheitis   . Unspecified adverse effect of other drug, medicinal and biological substance(995.29)   . UTI (lower urinary tract infection)   . Vitamin D deficiency    Past Surgical History:  Procedure Laterality Date  . BOTOX INJECTION N/A 09/03/2012   Procedure: BOTOX INJECTION;  Surgeon: Inda Castle, MD;  Location: [facility];  Service: Endoscopy;  Laterality: N/A;  . BREAST BIOPSY Left   . BREAST LUMPECTOMY Right 08/18/2014   Procedure: RIGHT BREAST LUMPECTOMY;  Surgeon: Coralie Keens, MD;  Location: [facility];  Service: General;  Laterality: Right;  . CARDIAC ELECTROPHYSIOLOGY MAPPING AND ABLATION    . CATARACT EXTRACTION,  BILATERAL    . ESOPHAGOGASTRODUODENOSCOPY N/A 09/03/2012   Procedure: ESOPHAGOGASTRODUODENOSCOPY (EGD);  Surgeon: Inda Castle, MD;  Location: [facility];  Service: Endoscopy;  Laterality: N/A;  . ESOPHAGOSCOPY W/ BOTOX INJECTION    . I&D EXTREMITY Right 11/06/2012   Procedure: IRRIGATION AND DEBRIDEMENT EXTREMITY Right Ring Finger;  Surgeon: Tennis Must, MD;  Location: [facility];  Service: Orthopedics;  Laterality: Right;  . PARTIAL HYSTERECTOMY     ovaries left in place    Allergies  Allergies  Allergen Reactions  . Darifenacin Hydrobromide     REACTION: causes her dizziness  . Sulfonamide Derivatives   . Tramadol Other (See Comments)    Insomnia, anorexia    HPI  [name] [name] returns today for evaluation and management of her history of paroxysmal A. fib, chronic persistent atrial flutter on chronic anticoagulation, history of mild aortic and mitral regurgitation, and history of mitral valve prolapse.  She is a former patient of Dr Verl Blalock, this is her follow up after 1 year. She feels very well, she is still very independent, driving, shopping. She only noticed getting more fatigued with exertion. No chest pain, shortness of breath, palpitations or syncope.  Echocardiogram in 2014 showed normal left ventricular function. There is in fact no aortic or mitral regurgitation. She was previously diagnosed with  those but they are not seen anymore. There is moderate tricuspid regurgitation and mild pulmonary hypertension.   11/04/2015, the patient remains very independent and she walks without a walker or cane, she denies any palpitations syncope no chest pain or shortness of breath. Her major problem in the last year has been diverticulitis with recurrent antibiotic use and significant weight loss now down to 106 pounds. She has no lower extremity edema, she is tolerating atorvastatin well. She denies any dizziness or falls.   Home Medications  Prior to Admission medications   Medication  Sig Start Date End Date Taking? Authorizing Provider  warfarin (COUMADIN) 2.5 MG tablet Take 2.5 mg by mouth as directed. Mon, wed, Friday 1.5 tab 08/15/12  Yes Renella Cunas, MD  ALPRAZolam Duanne Moron) 0.25 MG tablet Take 1 tablet (0.25 mg total) by mouth 3 (three) times daily. For stress 06/20/12 06/20/13  Mosie Lukes, MD  atorvastatin (LIPITOR) 10 MG tablet Take 1 tablet (10 mg total) by mouth daily. 05/11/12   Mosie Lukes, MD  calcium acetate (PHOSLO) 667 MG tablet Take 1 tablet by mouth 2 (two) times daily. 10/08/12   Mosie Lukes, MD  Cholecalciferol (VITAMIN D) 2000 UNITS CAPS Take 2,000 Units by mouth every other day. 02/20/12   Mosie Lukes, MD  metoprolol tartrate (LOPRESSOR) 25 MG tablet Take 0.5 tablets (12.5 mg total) by mouth 2 (two) times daily. bid 02/20/12   Mosie Lukes, MD  omeprazole-sodium bicarbonate (ZEGERID) 40-1100 MG per capsule Take one tab at bedtime 08/01/12   Inda Castle, MD  ramipril (ALTACE) 10 MG capsule Take 1 capsule (10 mg total) by mouth daily. 09/25/12   Mosie Lukes, MD  vitamin B-12 (CYANOCOBALAMIN) 1000 MCG tablet Take 1 tablet (1,000 mcg total) by mouth daily. 02/20/12   Mosie Lukes, MD    Family History  Family History  Problem Relation Age of Onset  . Diabetes Mother   . CVA Mother   . Stroke Mother   . COPD Father   . Rheum arthritis Father   . Allergies Daughter   . COPD Daughter   . Stroke Daughter   . COPD Daughter     previous smoker  . Stroke Maternal Grandfather   . Heart disease Maternal Aunt   . Heart disease Maternal Uncle   . Colon cancer Neg Hx   . Colon polyps Neg Hx   . Esophageal cancer Neg Hx   . Gallbladder disease Neg Hx   . Kidney disease Neg Hx   . Heart attack Neg Hx   . Hypertension Neg Hx    Social History  Social History   Social History  . Marital status: Widowed    Spouse name: N/A  . Number of children: 5  . Years of education: N/A   Occupational History  . retired Retired   Social History  Main Topics  . Smoking status: Never Smoker  . Smokeless tobacco: Never Used  . Alcohol use No  . Drug use: No  . Sexual activity: Not on file     Comment: lives with Daughter, grandson and his family. no dietary restricitons.   Other Topics Concern  . Not on file   Social History Narrative  . No narrative on file    Review of Systems General:  No chills, fever, night sweats or weight changes.  Cardiovascular:  No chest pain, dyspnea on exertion, edema, orthopnea, palpitations, paroxysmal nocturnal dyspnea. Dermatological: No rash, lesions/masses Respiratory: No  cough, dyspnea Urologic: No hematuria, dysuria Abdominal:   No nausea, vomiting, diarrhea, bright red blood per rectum, melena, or hematemesis Neurologic:  No visual changes, wkns, changes in mental status. All other systems reviewed and are otherwise negative except as noted above.  Physical Exam  BP 142/70, heart rate 48. Weight 106 lbs General: Pleasant, NAD Psych: Normal affect. Neuro: Alert and oriented X 3. Moves all extremities spontaneously. HEENT: Normal  Neck: Supple without bruits or JVD. Lungs:  Resp regular and unlabored, CTA. Heart: RRR no s3, s4, 2/6 systolic murmurs. Abdomen: Soft, non-tender, non-distended, BS + x 4.  Extremities: No clubbing, cyanosis, mild B/L LE edema around ankles. DP/PT/Radials 2+ and equal bilaterally.  Accessory Clinical Findings  Lipid Panel     Component Value Date/Time   CHOL 121 07/15/2015 0938   TRIG 99.0 07/15/2015 0938   HDL 38.10 (L) 07/15/2015 0938   CHOLHDL 3 07/15/2015 0938   VLDL 19.8 07/15/2015 0938   LDLCALC 63 07/15/2015 0938   ECHO: 10/27/2013 Study Conclusions  - Left ventricle: The cavity size was normal. Wall thickness was normal. Systolic function was normal. The estimated ejection fraction was in the range of 55% to 60%. Wall motion was normal; there were no regional wall motion abnormalities. - Left atrium: The atrium was mildly dilated. -  Right atrium: The atrium was mildly dilated. - Tricuspid valve: Moderate regurgitation. - Pulmonary arteries: Systolic pressure was mildly increased. PA peak pressure: 82mm Hg (S).  ECG: Atrial flutter with 3:1 conduction, unchanged fom 10/09/2013    Assessment & Plan  82 year old female with h/o HTN, AI and MR, mitral valve prolapse  1. Chronic persistent atrial fibrillation - rate controlled, asymptomatic, on warfarin, metoprolol, no bleeding with relatively slow heart rate however she states that at times can go up and she has no dizziness will continue the same dose.  3. Hypertension - repeat 142, ok for her age.  4. Lipid profile - on atorvastatin 10 mg daily, no memory impairment will continue.  5. Moderate TR, mild pulmonary HTN - asymptomatic  Follow up in 1 year.    Ena Dawley, MD 11/04/2015, 11:22 AM

## 2015-11-10 ENCOUNTER — Encounter (HOSPITAL_BASED_OUTPATIENT_CLINIC_OR_DEPARTMENT_OTHER): Payer: Self-pay | Admitting: Emergency Medicine

## 2015-11-10 ENCOUNTER — Emergency Department (HOSPITAL_BASED_OUTPATIENT_CLINIC_OR_DEPARTMENT_OTHER)
Admission: EM | Admit: 2015-11-10 | Discharge: 2015-11-10 | Disposition: A | Discharge status: Home / Self Care | Payer: Medicare Other | Attending: Emergency Medicine | Admitting: Emergency Medicine

## 2015-11-10 ENCOUNTER — Emergency Department (HOSPITAL_BASED_OUTPATIENT_CLINIC_OR_DEPARTMENT_OTHER): Payer: Medicare Other

## 2015-11-10 DIAGNOSIS — Z79899 Other long term (current) drug therapy: Secondary | ICD-10-CM | POA: Insufficient documentation

## 2015-11-10 DIAGNOSIS — R002 Palpitations: Secondary | ICD-10-CM | POA: Diagnosis present

## 2015-11-10 DIAGNOSIS — I10 Essential (primary) hypertension: Secondary | ICD-10-CM | POA: Diagnosis not present

## 2015-11-10 DIAGNOSIS — I491 Atrial premature depolarization: Secondary | ICD-10-CM | POA: Insufficient documentation

## 2015-11-10 DIAGNOSIS — Z7901 Long term (current) use of anticoagulants: Secondary | ICD-10-CM | POA: Insufficient documentation

## 2015-11-10 LAB — BASIC METABOLIC PANEL
ANION GAP: 10 (ref 5–15)
BUN: 15 mg/dL (ref 6–20)
CO2: 28 mmol/L (ref 22–32)
Calcium: 9.5 mg/dL (ref 8.9–10.3)
Chloride: 104 mmol/L (ref 101–111)
Creatinine, Ser: 1.05 mg/dL — ABNORMAL HIGH (ref 0.44–1.00)
GFR calc Af Amer: 53 mL/min — ABNORMAL LOW (ref >60)
GFR, EST NON AFRICAN AMERICAN: 46 mL/min — AB (ref >60)
GLUCOSE: 111 mg/dL — AB (ref 65–99)
POTASSIUM: 3.9 mmol/L (ref 3.5–5.1)
SODIUM: 142 mmol/L (ref 135–145)

## 2015-11-10 LAB — PROTIME-INR
INR: 2.06
PROTHROMBIN TIME: 23.5 s — AB (ref 11.4–15.2)

## 2015-11-10 LAB — CBC WITH DIFFERENTIAL/PLATELET
BASOS ABS: 0 10*3/uL (ref 0.0–0.1)
Basophils Relative: 0 %
EOS PCT: 1 %
Eosinophils Absolute: 0.1 10*3/uL (ref 0.0–0.7)
HCT: 39 % (ref 36.0–46.0)
HEMOGLOBIN: 13.1 g/dL (ref 12.0–15.0)
LYMPHS PCT: 26 %
Lymphs Abs: 1.8 10*3/uL (ref 0.7–4.0)
MCH: 31.4 pg (ref 26.0–34.0)
MCHC: 33.6 g/dL (ref 30.0–36.0)
MCV: 93.5 fL (ref 78.0–100.0)
Monocytes Absolute: 0.5 10*3/uL (ref 0.1–1.0)
Monocytes Relative: 7 %
NEUTROS PCT: 66 %
Neutro Abs: 4.5 10*3/uL (ref 1.7–7.7)
PLATELETS: 182 10*3/uL (ref 150–400)
RBC: 4.17 MIL/uL (ref 3.87–5.11)
RDW: 13.9 % (ref 11.5–15.5)
WBC: 6.9 10*3/uL (ref 4.0–10.5)

## 2015-11-10 LAB — TROPONIN I

## 2015-11-10 NOTE — ED Notes (Signed)
Patient transported to X-ray 

## 2015-11-10 NOTE — ED Notes (Signed)
Warm blanket given

## 2015-11-10 NOTE — ED Triage Notes (Signed)
Pt c/o pain in area of left shoulder blade. Pt reports congestion and cough x 2 days. Denies fever.

## 2015-11-10 NOTE — ED Provider Notes (Signed)
[facility] Provider Note: Georgena Spurling, MD, FACEP  CSN: [id number] MRN: [id number] ARRIVAL: 11/10/15 at 0312 ROOM: MH06/MH06   CHIEF COMPLAINT  Palpitations   HISTORY OF PRESENT ILLNESS  [name] [name] is a 82 y.o. female with a history of paroxysmal atrial fibrillation. She had the onset of a rapid, irregular heartbeat yesterday evening about 8 PM. This was associated with a discomfort in her left scapular region. There was no associated chest pain, shortness of breath, nausea or diaphoresis. Her symptoms improved after a couple of hours and she is only having an occasional palpitation at this time. There are no specific mitigating or exacerbating factors. She has had an occasional cough recently but no fever.   Past Medical History:  Diagnosis Date  . Anemia   . Anxiety   . Arthritis    hips, knees  . Atrial fibrillation ([place])   . Constipation   . Cough   . Current use of long term anticoagulation   . Diarrhea   . Diverticulitis of colon 02/06/2015  . Diverticulosis   . Dysrhythmia    a-fib  . Gait difficulty   . GERD (gastroesophageal reflux disease)   . Headache(784.0) 06/22/2012  . History of mammogram 2009  . Hyperkalemia 06/28/2012  . Hyperlipidemia   . Hyperlipidemia   . Hypertension   . Hypertension   . IBS (irritable bowel syndrome)   . Incontinence   . Loss of weight 06/05/2015  . Medicare annual wellness visit, subsequent 02/05/2014  . Melena   . Mitral and aortic regurgitation   . Mitral valve prolapse    hx of  . Osteopenia   . Paroxysmal a-fib ([place])   . Personal history of colonic polyps   . Pneumonia   . Pneumonia    organism unspecified  . Renal insufficiency 06/28/2012  . Renal lithiasis 06/26/2015  . Thyroid disease    hypo  . Tracheitis   . Unspecified adverse effect of other drug, medicinal and biological substance(995.29)   . UTI (lower urinary tract infection)   . Vitamin D deficiency     Past Surgical History:  Procedure  Laterality Date  . BOTOX INJECTION N/A 09/03/2012   Procedure: BOTOX INJECTION;  Surgeon: Inda Castle, MD;  Location: [facility];  Service: Endoscopy;  Laterality: N/A;  . BREAST BIOPSY Left   . BREAST LUMPECTOMY Right 08/18/2014   Procedure: RIGHT BREAST LUMPECTOMY;  Surgeon: Coralie Keens, MD;  Location: [facility];  Service: General;  Laterality: Right;  . CARDIAC ELECTROPHYSIOLOGY MAPPING AND ABLATION    . CATARACT EXTRACTION, BILATERAL    . ESOPHAGOGASTRODUODENOSCOPY N/A 09/03/2012   Procedure: ESOPHAGOGASTRODUODENOSCOPY (EGD);  Surgeon: Inda Castle, MD;  Location: [facility];  Service: Endoscopy;  Laterality: N/A;  . ESOPHAGOSCOPY W/ BOTOX INJECTION    . I&D EXTREMITY Right 11/06/2012   Procedure: IRRIGATION AND DEBRIDEMENT EXTREMITY Right Ring Finger;  Surgeon: Tennis Must, MD;  Location: [facility];  Service: Orthopedics;  Laterality: Right;  . PARTIAL HYSTERECTOMY     ovaries left in place    Family History  Problem Relation Age of Onset  . Diabetes Mother   . CVA Mother   . Stroke Mother   . COPD Father   . Rheum arthritis Father   . Allergies Daughter   . COPD Daughter   . Stroke Daughter   . COPD Daughter     previous smoker  . Stroke Maternal Grandfather   . Heart disease Maternal Aunt   .  Heart disease Maternal Uncle   . Colon cancer Neg Hx   . Colon polyps Neg Hx   . Esophageal cancer Neg Hx   . Gallbladder disease Neg Hx   . Kidney disease Neg Hx   . Heart attack Neg Hx   . Hypertension Neg Hx     Social History  Substance Use Topics  . Smoking status: Never Smoker  . Smokeless tobacco: Never Used  . Alcohol use No    Prior to Admission medications   Medication Sig Start Date End Date Taking? Authorizing Provider  ALPRAZolam Duanne Moron) 0.25 MG tablet TAKE 1 TABLET BY MOUTH 3 TIMES A DAY 10/26/15   Mosie Lukes, MD  atorvastatin (LIPITOR) 10 MG tablet TAKE 1 TABLET (10 MG TOTAL) BY MOUTH DAILY AT 6 PM. 08/04/15   Mosie Lukes, MD    Cholecalciferol (VITAMIN D) 2000 UNITS CAPS Take 2,000 Units by mouth every other day. 02/20/12   Mosie Lukes, MD  Lactobacillus-Inulin (PROBIOTIC DIGESTIVE SUPPORT PO) Take 1 capsule by mouth daily.    Historical Provider, MD  metoprolol tartrate (LOPRESSOR) 25 MG tablet TAKE 0.5 TABLETS (12.5 MG TOTAL) BY MOUTH 2 TIMES DAILY. 09/23/15   Mosie Lukes, MD  pantoprazole (PROTONIX) 40 MG tablet TAKE 1 TABLET (40 MG TOTAL) BY MOUTH DAILY. 09/15/15   Mosie Lukes, MD  ramipril (ALTACE) 10 MG capsule TAKE 1 CAPSULE (10 MG TOTAL) BY MOUTH DAILY. 01/23/15   Mosie Lukes, MD  vitamin B-12 (CYANOCOBALAMIN) 1000 MCG tablet Take 1 tablet (1,000 mcg total) by mouth daily. 02/20/12   Mosie Lukes, MD  warfarin (COUMADIN) 2.5 MG tablet TAKE AS DIRECTED BY ANTICOAGULATION CLINIC 09/08/15   Dorothy Spark, MD    Allergies Darifenacin hydrobromide; Sulfonamide derivatives; and Tramadol   REVIEW OF SYSTEMS  Negative except as noted here or in the History of Present Illness.   PHYSICAL EXAMINATION  Initial Vital Signs Blood pressure (!) 201/94, pulse 85, temperature 98.2 F (36.8 C), temperature source Oral, resp. rate 18, height 4\' 11"  (1.499 m), weight 106 lb (48.1 kg), SpO2 97 %.  Examination General: Well-developed, well-nourished female in no acute distress; appearance consistent with age of record HENT: normocephalic; atraumatic Eyes: pupils equal, round and reactive to light; extraocular muscles intact; Lens implants Neck: supple Heart: regular rate and rhythm; no murmur; occasional PACs Lungs: clear to auscultation bilaterally Abdomen: soft; nondistended; nontender; no masses or hepatosplenomegaly; bowel sounds present Extremities: No deformity; full range of motion; pulses normal Neurologic: Awake, alert and oriented; motor function intact in all extremities and symmetric; no facial droop Skin: Warm and dry Psychiatric: Normal mood and affect   RESULTS  Summary of this visit's  results, reviewed by myself:   EKG Interpretation  Date/Time:  Thursday November 10 2015 03:23:00 EST Ventricular Rate:  77 PR Interval:    QRS Duration: 97 QT Interval:  387 QTC Calculation: 438 R Axis:   -7 Text Interpretation:  Sinus rhythm Probable anteroseptal infarct, old Borderline ST depression, anterolateral leads Baseline wander in lead(s) V3 No significant change was found Confirmed by Mehran Guderian  MD, Jenny Reichmann ([id number]) on 11/10/2015 3:33:17 AM      Laboratory Studies: Results for orders placed or performed during the hospital encounter of 11/10/15 (from the past 24 hour(s))  CBC with Differential/Platelet     Status: None   Collection Time: 11/10/15  3:20 AM  Result Value Ref Range   WBC 6.9 4.0 - 10.5 K/uL   RBC 4.17  3.87 - 5.11 MIL/uL   Hemoglobin 13.1 12.0 - 15.0 g/dL   HCT 39.0 36.0 - 46.0 %   MCV 93.5 78.0 - 100.0 fL   MCH 31.4 26.0 - 34.0 pg   MCHC 33.6 30.0 - 36.0 g/dL   RDW 13.9 11.5 - 15.5 %   Platelets 182 150 - 400 K/uL   Neutrophils Relative % 66 %   Neutro Abs 4.5 1.7 - 7.7 K/uL   Lymphocytes Relative 26 %   Lymphs Abs 1.8 0.7 - 4.0 K/uL   Monocytes Relative 7 %   Monocytes Absolute 0.5 0.1 - 1.0 K/uL   Eosinophils Relative 1 %   Eosinophils Absolute 0.1 0.0 - 0.7 K/uL   Basophils Relative 0 %   Basophils Absolute 0.0 0.0 - 0.1 K/uL  Basic metabolic panel     Status: Abnormal   Collection Time: 11/10/15  3:20 AM  Result Value Ref Range   Sodium 142 135 - 145 mmol/L   Potassium 3.9 3.5 - 5.1 mmol/L   Chloride 104 101 - 111 mmol/L   CO2 28 22 - 32 mmol/L   Glucose, Bld 111 (H) 65 - 99 mg/dL   BUN 15 6 - 20 mg/dL   Creatinine, Ser 1.05 (H) 0.44 - 1.00 mg/dL   Calcium 9.5 8.9 - 10.3 mg/dL   GFR calc non Af Amer 46 (L) >60 mL/min   GFR calc Af Amer 53 (L) >60 mL/min   Anion gap 10 5 - 15  Protime-INR     Status: Abnormal   Collection Time: 11/10/15  3:20 AM  Result Value Ref Range   Prothrombin Time 23.5 (H) 11.4 - 15.2 seconds   INR 2.06   Troponin  I     Status: None   Collection Time: 11/10/15  3:20 AM  Result Value Ref Range   Troponin I <0.03 <0.03 ng/mL   Imaging Studies: Dg Chest 2 View  Result Date: 11/10/2015 CLINICAL DATA:  Coughing congestion for 2 days. Left scapular pain. History of atrial fibrillation. Nonsmoker. EXAM: CHEST  2 VIEW COMPARISON:  08/27/2015 FINDINGS: Normal heart size and pulmonary vascularity. Calcified and tortuous aorta. Large esophageal hiatal hernia behind the heart. Emphysematous changes in the lungs with fibrosis and peribronchial thickening consistent with chronic bronchitis. No blunting of costophrenic angles. No pneumothorax. No focal airspace disease or consolidation. IMPRESSION: Emphysematous changes and chronic bronchitic changes in the lungs. Large esophageal hiatal hernia. No evidence of active pulmonary disease. Electronically Signed   By: Lucienne Capers M.D.   On: 11/10/2015 03:54    ED COURSE  Nursing notes and initial vitals signs, including pulse oximetry, reviewed.  Vitals:   11/10/15 0318 11/10/15 0320  BP:  (!) 201/94  Pulse:  85  Resp:  18  Temp:  98.2 F (36.8 C)  TempSrc:  Oral  SpO2:  97%  Weight: 106 lb (48.1 kg)   Height: 4\' 11"  (1.499 m)    4:01 AM The patient has remained in sinus rhythm with occasional PACs throughout her ED stay. I suspect she had an episode of atrial fibrillation with rapid ventricular response that spontaneously abated. She has a history of the same.  PROCEDURES    ED DIAGNOSES     ICD-9-CM ICD-10-CM   1. Premature atrial contractions 427.61 I49.1        Shanon Rosser, MD 11/10/15 0401

## 2015-12-05 ENCOUNTER — Other Ambulatory Visit: Payer: Self-pay | Admitting: Family Medicine

## 2015-12-05 NOTE — Telephone Encounter (Signed)
Faxed hardcopy for alprazolam to [facility] in [place]

## 2015-12-05 NOTE — Telephone Encounter (Signed)
Requesting:   alprazolam Contract     02/05/2014 UDS     08/06/2014 Last OV    07/15/2015 Last Refill    #[id number] with 0 refills on 1025/2017  Please Advise

## 2015-12-09 ENCOUNTER — Ambulatory Visit (INDEPENDENT_AMBULATORY_CARE_PROVIDER_SITE_OTHER): Payer: Medicare Other | Admitting: Pharmacist

## 2015-12-09 DIAGNOSIS — I4891 Unspecified atrial fibrillation: Secondary | ICD-10-CM

## 2015-12-09 DIAGNOSIS — Z5181 Encounter for therapeutic drug level monitoring: Secondary | ICD-10-CM

## 2015-12-09 LAB — POCT INR: INR: 2.8

## 2016-01-02 ENCOUNTER — Other Ambulatory Visit: Payer: Self-pay | Admitting: Family Medicine

## 2016-01-02 ENCOUNTER — Other Ambulatory Visit: Payer: Self-pay | Admitting: Cardiology

## 2016-01-10 ENCOUNTER — Other Ambulatory Visit: Payer: Self-pay | Admitting: Family Medicine

## 2016-01-11 ENCOUNTER — Other Ambulatory Visit: Payer: Self-pay | Admitting: Family Medicine

## 2016-01-20 ENCOUNTER — Ambulatory Visit (INDEPENDENT_AMBULATORY_CARE_PROVIDER_SITE_OTHER): Payer: Medicare Other | Admitting: *Deleted

## 2016-01-20 ENCOUNTER — Encounter: Payer: Medicare Other | Admitting: Family Medicine

## 2016-01-20 DIAGNOSIS — Z5181 Encounter for therapeutic drug level monitoring: Secondary | ICD-10-CM

## 2016-01-20 DIAGNOSIS — I4891 Unspecified atrial fibrillation: Secondary | ICD-10-CM

## 2016-01-20 LAB — POCT INR: INR: 3.1

## 2016-02-19 ENCOUNTER — Other Ambulatory Visit: Payer: Self-pay | Admitting: Family Medicine

## 2016-02-20 NOTE — Telephone Encounter (Signed)
Last seen 09/15/15  Last filled 01/11/16 [id number]  Please advise  Pc

## 2016-02-20 NOTE — Telephone Encounter (Signed)
I have signed but she may need a contract and a UDS

## 2016-02-21 ENCOUNTER — Emergency Department (HOSPITAL_BASED_OUTPATIENT_CLINIC_OR_DEPARTMENT_OTHER): Payer: Medicare Other

## 2016-02-21 ENCOUNTER — Telehealth: Payer: Self-pay | Admitting: Family Medicine

## 2016-02-21 ENCOUNTER — Encounter (HOSPITAL_BASED_OUTPATIENT_CLINIC_OR_DEPARTMENT_OTHER): Payer: Self-pay

## 2016-02-21 ENCOUNTER — Emergency Department (HOSPITAL_BASED_OUTPATIENT_CLINIC_OR_DEPARTMENT_OTHER)
Admission: EM | Admit: 2016-02-21 | Discharge: 2016-02-21 | Disposition: A | Discharge status: Home / Self Care | Payer: Medicare Other | Attending: Emergency Medicine | Admitting: Emergency Medicine

## 2016-02-21 DIAGNOSIS — E039 Hypothyroidism, unspecified: Secondary | ICD-10-CM | POA: Diagnosis not present

## 2016-02-21 DIAGNOSIS — I951 Orthostatic hypotension: Secondary | ICD-10-CM | POA: Insufficient documentation

## 2016-02-21 DIAGNOSIS — E860 Dehydration: Secondary | ICD-10-CM | POA: Diagnosis not present

## 2016-02-21 DIAGNOSIS — I10 Essential (primary) hypertension: Secondary | ICD-10-CM | POA: Insufficient documentation

## 2016-02-21 DIAGNOSIS — R42 Dizziness and giddiness: Secondary | ICD-10-CM | POA: Diagnosis present

## 2016-02-21 DIAGNOSIS — Z79899 Other long term (current) drug therapy: Secondary | ICD-10-CM | POA: Insufficient documentation

## 2016-02-21 LAB — CBC WITH DIFFERENTIAL/PLATELET
BASOS PCT: 0 %
Basophils Absolute: 0 10*3/uL (ref 0.0–0.1)
EOS ABS: 0 10*3/uL (ref 0.0–0.7)
EOS PCT: 0 %
HCT: 37.9 % (ref 36.0–46.0)
Hemoglobin: 12.7 g/dL (ref 12.0–15.0)
LYMPHS ABS: 1.1 10*3/uL (ref 0.7–4.0)
Lymphocytes Relative: 14 %
MCH: 31.1 pg (ref 26.0–34.0)
MCHC: 33.5 g/dL (ref 30.0–36.0)
MCV: 92.9 fL (ref 78.0–100.0)
MONO ABS: 0.5 10*3/uL (ref 0.1–1.0)
MONOS PCT: 6 %
Neutro Abs: 6.4 10*3/uL (ref 1.7–7.7)
Neutrophils Relative %: 80 %
PLATELETS: 158 10*3/uL (ref 150–400)
RBC: 4.08 MIL/uL (ref 3.87–5.11)
RDW: 14.1 % (ref 11.5–15.5)
WBC: 8 10*3/uL (ref 4.0–10.5)

## 2016-02-21 LAB — COMPREHENSIVE METABOLIC PANEL
ALBUMIN: 3.9 g/dL (ref 3.5–5.0)
ALT: 16 U/L (ref 14–54)
ANION GAP: 7 (ref 5–15)
AST: 25 U/L (ref 15–41)
Alkaline Phosphatase: 16 U/L — ABNORMAL LOW (ref 38–126)
BUN: 11 mg/dL (ref 6–20)
CALCIUM: 9.1 mg/dL (ref 8.9–10.3)
CO2: 29 mmol/L (ref 22–32)
Chloride: 103 mmol/L (ref 101–111)
Creatinine, Ser: 0.95 mg/dL (ref 0.44–1.00)
GFR calc non Af Amer: 51 mL/min — ABNORMAL LOW (ref >60)
GFR, EST AFRICAN AMERICAN: 59 mL/min — AB (ref >60)
GLUCOSE: 106 mg/dL — AB (ref 65–99)
POTASSIUM: 3.6 mmol/L (ref 3.5–5.1)
SODIUM: 139 mmol/L (ref 135–145)
Total Bilirubin: 0.8 mg/dL (ref 0.3–1.2)
Total Protein: 7.4 g/dL (ref 6.5–8.1)

## 2016-02-21 LAB — MAGNESIUM: Magnesium: 1.8 mg/dL (ref 1.7–2.4)

## 2016-02-21 LAB — URINALYSIS, ROUTINE W REFLEX MICROSCOPIC
BILIRUBIN URINE: NEGATIVE
Glucose, UA: NEGATIVE mg/dL
Hgb urine dipstick: NEGATIVE
KETONES UR: NEGATIVE mg/dL
Leukocytes, UA: NEGATIVE
NITRITE: NEGATIVE
Protein, ur: NEGATIVE mg/dL
Specific Gravity, Urine: 1.008 (ref 1.005–1.030)
pH: 7.5 (ref 5.0–8.0)

## 2016-02-21 LAB — TROPONIN I

## 2016-02-21 MED ORDER — SODIUM CHLORIDE 0.9 % IV BOLUS (SEPSIS)
1000.0000 mL | Freq: Once | INTRAVENOUS | Status: AC
  Administered 2016-02-21: 1000 mL via INTRAVENOUS

## 2016-02-21 NOTE — ED Triage Notes (Addendum)
C/o dizziness x 3 days-denies CP-HA "everyday"-NAD-steady gait but states dizziness is worse with ambulation

## 2016-02-21 NOTE — Telephone Encounter (Signed)
Patient Name: [name] [name] DOB: November 09, 1924 Initial Comment caller states she has dizziness Nurse Assessment Nurse: Ronnald Ramp, RN, Miranda Date/Time (Eastern Time): 02/21/2016 10:23:23 AM Confirm and document reason for call. If symptomatic, describe symptoms. ---Caller states has been having dizziness when she stands/walks since Sunday. BP 175/98 when she woke up. Does the patient have any new or worsening symptoms? ---Yes Will a triage be completed? ---Yes Related visit to physician within the last 2 weeks? ---No Does the PT have any chronic conditions? (i.e. diabetes, asthma, etc.) ---Yes List chronic conditions. ---HTN, Anxiety Is this a behavioral health or substance abuse call? ---No Guidelines Guideline Title Affirmed Question Affirmed Notes Dizziness - Vertigo [1] Dizziness (vertigo) present now AND [2] one or more stroke risk factors (i.e., hypertension, diabetes, prior stroke/TIA/ heart attack) (Exception: prior physician evaluation for this AND no different/worse than usual) Final Disposition User Go to ED Now (or PCP triage) Ronnald Ramp, RN, Miranda Comments No appt available at Primary care office or [facility]. Caller states they will go to UC or ED to be seen. Referrals GO TO FACILITY UNDECIDED Disagree/Comply: Comply

## 2016-02-21 NOTE — ED Provider Notes (Signed)
[facility] DEPT Provider Note   CSN: [id number] Arrival date & time: 02/21/16  1106     History   Chief Complaint Chief Complaint  Patient presents with  . Dizziness    HPI [name] [name] is a 83 y.o. female.  HPI  83 year old female with past medical history as below presents with mild dizziness which has improved today. The patient states over the last several days, she has had increased urinary frequency. She has not been drinking as much water as she normally does. Throughout the day yesterday, she notes dizziness and a sensation of lightheadedness. This only occurred with standing and resolved with sitting down. It also occurred with transitioning from lying to standing. Denies any other significant complaints. Specifically, she denies any associated chest pain, shortness of breath, palpitations, or other complaints. No recent fevers or chills. No flank pain. She has not recently changed any of her medications.   Past Medical History:  Diagnosis Date  . Anemia   . Anxiety   . Arthritis    hips, knees  . Atrial fibrillation ([place])   . Constipation   . Cough   . Current use of long term anticoagulation   . Diarrhea   . Diverticulitis of colon 02/06/2015  . Diverticulosis   . Dysrhythmia    a-fib  . Gait difficulty   . GERD (gastroesophageal reflux disease)   . Headache(784.0) 06/22/2012  . History of mammogram 2009  . Hyperkalemia 06/28/2012  . Hyperlipidemia   . Hyperlipidemia   . Hypertension   . Hypertension   . IBS (irritable bowel syndrome)   . Incontinence   . Loss of weight 06/05/2015  . Medicare annual wellness visit, subsequent 02/05/2014  . Melena   . Mitral and aortic regurgitation   . Mitral valve prolapse    hx of  . Osteopenia   . Paroxysmal a-fib ([place])   . Personal history of colonic polyps   . Pneumonia   . Pneumonia    organism unspecified  . Renal insufficiency 06/28/2012  . Renal lithiasis 06/26/2015  . Thyroid disease    hypo  .  Tracheitis   . Unspecified adverse effect of other drug, medicinal and biological substance(995.29)   . UTI (lower urinary tract infection)   . Vitamin D deficiency     Patient Active Problem List   Diagnosis Date Noted  . Renal lithiasis 06/26/2015  . Loss of weight 06/05/2015  . Diverticulitis of colon 02/06/2015  . Right-sided thoracic back pain 11/15/2014  . Nipple discharge 06/21/2014  . Rib pain on right side 04/29/2014  . Medicare annual wellness visit, subsequent 02/05/2014  . Abdominal pain, chronic, right lower quadrant 02/03/2014  . Rectal bleeding 12/03/2013  . Prolapse of female pelvic organs 12/03/2013  . Leukocytes in urine 11/25/2013  . Abnormal chest x-ray 10/23/2013  . Pulmonary hypertension 10/09/2013  . Tricuspid regurgitation 10/09/2013  . Chronic anticoagulation 10/09/2013  . CAP (community acquired pneumonia) 09/08/2013  . Hyperglycemia 08/24/2013  . Encounter for therapeutic drug monitoring 03/13/2013  . Renal insufficiency 06/28/2012  . Hyperkalemia 06/28/2012  . Headache(784.0) 06/22/2012  . Sinus bradycardia 09/20/2011  . Increased urinary frequency 02/13/2011  . Arthritis 11/13/2010  . Long term (current) use of anticoagulants 03/31/2010  . DIZZINESS 08/03/2009  . ACHALASIA 05/18/2009  . DYSPHAGIA UNSPECIFIED 04/11/2009  . Anemia 01/19/2009  . ESOPHAGEAL STRICTURE 01/19/2009  . ANXIETY 09/15/2008  . MITRAL REGURGITATION, MILD 08/20/2008  . PAROXYSMAL ATRIAL FIBRILLATION 08/20/2008  . MITRAL VALVE PROLAPSE, HX OF  08/20/2008  . Vitamin D deficiency 01/15/2008  . IBS 02/03/2007  . Hypothyroidism 01/29/2007  . Hyperlipidemia, mixed 01/29/2007  . Essential hypertension 01/29/2007  . GERD 01/29/2007  . Disorder of bone and cartilage 01/29/2007  . COLONIC POLYPS, HX OF 01/29/2007    Past Surgical History:  Procedure Laterality Date  . BOTOX INJECTION N/A 09/03/2012   Procedure: BOTOX INJECTION;  Surgeon: Inda Castle, MD;  Location: [facility]  ENDOSCOPY;  Service: Endoscopy;  Laterality: N/A;  . BREAST BIOPSY Left   . BREAST LUMPECTOMY Right 08/18/2014   Procedure: RIGHT BREAST LUMPECTOMY;  Surgeon: Coralie Keens, MD;  Location: [facility];  Service: General;  Laterality: Right;  . CARDIAC ELECTROPHYSIOLOGY MAPPING AND ABLATION    . CATARACT EXTRACTION, BILATERAL    . ESOPHAGOGASTRODUODENOSCOPY N/A 09/03/2012   Procedure: ESOPHAGOGASTRODUODENOSCOPY (EGD);  Surgeon: Inda Castle, MD;  Location: [facility];  Service: Endoscopy;  Laterality: N/A;  . ESOPHAGOSCOPY W/ BOTOX INJECTION    . I&D EXTREMITY Right 11/06/2012   Procedure: IRRIGATION AND DEBRIDEMENT EXTREMITY Right Ring Finger;  Surgeon: Tennis Must, MD;  Location: [facility];  Service: Orthopedics;  Laterality: Right;  . PARTIAL HYSTERECTOMY     ovaries left in place    OB History    No data available       Home Medications    Prior to Admission medications   Medication Sig Start Date End Date Taking? Authorizing Provider  ALPRAZolam Duanne Moron) 0.25 MG tablet TAKE 1 TABLET BY MOUTH 3 TIMES A DAY 01/11/16   Mosie Lukes, MD  atorvastatin (LIPITOR) 10 MG tablet TAKE 1 TABLET (10 MG TOTAL) BY MOUTH DAILY AT 6 PM. 01/03/16   Mosie Lukes, MD  Cholecalciferol (VITAMIN D) 2000 UNITS CAPS Take 2,000 Units by mouth every other day. 02/20/12   Mosie Lukes, MD  Lactobacillus-Inulin (PROBIOTIC DIGESTIVE SUPPORT PO) Take 1 capsule by mouth daily.    Historical Provider, MD  metoprolol tartrate (LOPRESSOR) 25 MG tablet TAKE 0.5 TABLETS (12.5 MG TOTAL) BY MOUTH 2 TIMES DAILY. 09/23/15   Mosie Lukes, MD  pantoprazole (PROTONIX) 40 MG tablet TAKE 1 TABLET (40 MG TOTAL) BY MOUTH DAILY. 09/15/15   Mosie Lukes, MD  ramipril (ALTACE) 10 MG capsule TAKE 1 CAPSULE (10 MG TOTAL) BY MOUTH DAILY. 01/10/16   Mosie Lukes, MD  vitamin B-12 (CYANOCOBALAMIN) 1000 MCG tablet Take 1 tablet (1,000 mcg total) by mouth daily. 02/20/12   Mosie Lukes, MD  warfarin (COUMADIN) 2.5 MG  tablet TAKE AS DIRECTED BY ANTICOAGULATION CLINIC 01/03/16   Dorothy Spark, MD    Family History Family History  Problem Relation Age of Onset  . Diabetes Mother   . CVA Mother   . Stroke Mother   . COPD Father   . Rheum arthritis Father   . Allergies Daughter   . COPD Daughter   . Stroke Daughter   . COPD Daughter     previous smoker  . Stroke Maternal Grandfather   . Heart disease Maternal Aunt   . Heart disease Maternal Uncle   . Colon cancer Neg Hx   . Colon polyps Neg Hx   . Esophageal cancer Neg Hx   . Gallbladder disease Neg Hx   . Kidney disease Neg Hx   . Heart attack Neg Hx   . Hypertension Neg Hx     Social History Social History  Substance Use Topics  . Smoking status: Never Smoker  . Smokeless tobacco:  Never Used  . Alcohol use No     Allergies   Darifenacin hydrobromide; Sulfonamide derivatives; and Tramadol   Review of Systems Review of Systems  Constitutional: Negative for chills, fatigue and fever.  HENT: Negative for congestion and rhinorrhea.   Eyes: Negative for visual disturbance.  Respiratory: Negative for cough, shortness of breath and wheezing.   Cardiovascular: Negative for chest pain and leg swelling.  Gastrointestinal: Negative for abdominal pain, diarrhea, nausea and vomiting.  Genitourinary: Negative for dysuria and flank pain.  Musculoskeletal: Negative for neck pain and neck stiffness.  Skin: Negative for rash and wound.  Allergic/Immunologic: Negative for immunocompromised state.  Neurological: Positive for dizziness and light-headedness. Negative for syncope, weakness and headaches.  All other systems reviewed and are negative.    Physical Exam Updated Vital Signs BP 172/77 (BP Location: Right Arm)   Pulse 80   Temp 97.9 F (36.6 C) (Oral)   Resp 18   Ht 4\' 11"  (1.499 m)   Wt 106 lb (48.1 kg)   SpO2 95%   BMI 21.41 kg/m   Physical Exam  Constitutional: She is oriented to person, place, and time. She appears  well-developed and well-nourished. No distress.  HENT:  Head: Normocephalic and atraumatic.  Mouth/Throat: Oropharynx is clear and moist.  Eyes: Conjunctivae are normal.  Neck: Neck supple.  Cardiovascular: Normal rate, regular rhythm and normal heart sounds.  Exam reveals no friction rub.   No murmur heard. Pulmonary/Chest: Effort normal and breath sounds normal. No respiratory distress. She has no wheezes. She has no rales.  Abdominal: She exhibits no distension.  Musculoskeletal: She exhibits no edema.  Neurological: She is alert and oriented to person, place, and time. She has normal strength. No cranial nerve deficit or sensory deficit. She exhibits normal muscle tone. Coordination and gait normal. GCS eye subscore is 4. GCS verbal subscore is 5. GCS motor subscore is 6.  Skin: Skin is warm. Capillary refill takes less than 2 seconds.  Psychiatric: She has a normal mood and affect.  Nursing note and vitals reviewed.    ED Treatments / Results  Labs (all labs ordered are listed, but only abnormal results are displayed) Labs Reviewed  COMPREHENSIVE METABOLIC PANEL - Abnormal; Notable for the following:       Result Value   Glucose, Bld 106 (*)    Alkaline Phosphatase 16 (*)    GFR calc non Af Amer 51 (*)    GFR calc Af Amer 59 (*)    All other components within normal limits  CBC WITH DIFFERENTIAL/PLATELET  MAGNESIUM  TROPONIN I  URINALYSIS, ROUTINE W REFLEX MICROSCOPIC    EKG  EKG Interpretation  Date/Time:  Tuesday February 21 2016 11:32:33 EST Ventricular Rate:  83 PR Interval:    QRS Duration: 92 QT Interval:  390 QTC Calculation: 459 R Axis:   11 Text Interpretation:  Sinus rhythm Prolonged PR interval No significant change since last tracing Confirmed by Braelynne Garinger MD, Lysbeth Galas [phone]) on 02/21/2016 5:06:03 PM       Radiology Dg Chest 2 View  Result Date: 02/21/2016 CLINICAL DATA:  Three days of dizziness. Patient ports daily headaches. History of atrial  fibrillation and hyperlipidemia and mitral valve disease. EXAM: CHEST  2 VIEW COMPARISON:  Chest x-ray of November 10, 2015 FINDINGS: The lungs remain hyperinflated. There is hazy increased density in the right upper lobe which is not entirely new. The heart and pulmonary vascularity are normal. There is a large hiatal hernia. There is calcification  in the wall of the thoracic aorta. There is no pleural effusion. There is prominent thoracic kyphosis. IMPRESSION: COPD. Chronically increased density in the right upper lobe. No definite pneumonia. Thoracic aortic atherosclerosis. Large hiatal hernia. Electronically Signed   By: David  Martinique M.D.   On: 02/21/2016 12:26   Ct Head Wo Contrast  Result Date: 02/21/2016 CLINICAL DATA:  Dizziness for 3 days.  Headache. EXAM: CT HEAD WITHOUT CONTRAST TECHNIQUE: Contiguous axial images were obtained from the base of the skull through the vertex without intravenous contrast. COMPARISON:  None. FINDINGS: Brain: No evidence of acute infarction, hemorrhage, hydrocephalus, extra-axial collection or mass lesion/mass effect. There is moderate brain parenchymal volume loss and deep white matter microangiopathy, including within the pons. Vascular: Calcific atherosclerotic disease at the skullbase. Skull: Normal. Negative for fracture or focal lesion. Sinuses/Orbits: No acute finding. Other: None. IMPRESSION: No acute intracranial abnormality. Atrophy, chronic microvascular disease. Electronically Signed   By: Fidela Salisbury M.D.   On: 02/21/2016 12:27    Procedures Procedures (including critical care time)  Medications Ordered in ED Medications  sodium chloride 0.9 % bolus 1,000 mL (0 mLs Intravenous Stopped 02/21/16 1418)     Initial Impression / Assessment and Plan / ED Course  I have reviewed the triage vital signs and the nursing notes.  Pertinent labs & imaging results that were available during my care of the patient were reviewed by me and considered in my  medical decision making (see chart for details).     83 year old female with asthma medical history as above who presents with dizziness upon standing for 2 days in the setting of decreased by mouth intake. Suspect patient's symptoms are secondary to orthostasis. On arrival, she does have a greater than 20 point drop in her blood pressure upon standing. She also states her symptoms are improved today, suspect this was likely worse yesterday. Otherwise, no recent medication changes. She has no evidence of arrhythmia or ACS on her EKG. Her screening lab work is unremarkable with no evidence of leukocytosis, anemia, or electrolyte abnormality. Urinalysis is also negative. CT head shows no evidence of stroke and she has a completely normal neurological exam with no evidence of cerebellar dysfunction at this time. Given resolution of symptoms with fluids and history consistent with orthostasis, will advise patient to encourage fluids. She is hypertensive here but will hold on starting any medications as she is already orthostatic. Will discharge with outpatient follow-up.  Final Clinical Impressions(s) / ED Diagnoses   Final diagnoses:  Orthostasis  Dehydration    New Prescriptions Discharge Medication List as of 02/21/2016  2:29 PM       Duffy Bruce, MD 02/22/16 [id number]

## 2016-02-21 NOTE — ED Notes (Signed)
BP cuff not working will try to get othrostatics after she returns from ct

## 2016-02-21 NOTE — ED Notes (Signed)
Ambulated around nurses station tolerated well.

## 2016-02-21 NOTE — Discharge Instructions (Signed)
-   Drink at least 8 glasses of water or juice for the next several days - Call your doctor to discuss your ED visit and set up a follow-up appointment - If dizziness returns and does not resolve with sitting down, return to the ED

## 2016-02-23 ENCOUNTER — Telehealth: Payer: Self-pay | Admitting: Family Medicine

## 2016-02-23 NOTE — Telephone Encounter (Signed)
Relation to [id number] Call back number:[phone] Pharmacy: [facility] [address] [place], [place] [address] [phone]   Reason for call:  Patient requesting a refill ALPRAZolam (XANAX) 0.25 MG tablet

## 2016-02-23 NOTE — Telephone Encounter (Signed)
Requesting:   alprazolam Contract   02/15/2014 UDS     Low risk--due Last OV  07/15/2015---next scheduled appt. Is 03/16/2016 Last Refill    #90 with 0 refills on 01/11/2016  Please Advise

## 2016-02-23 NOTE — Telephone Encounter (Signed)
Can have one last refill but have her update uds and contract

## 2016-02-24 ENCOUNTER — Encounter: Payer: Self-pay | Admitting: Family Medicine

## 2016-02-24 MED ORDER — ALPRAZOLAM 0.25 MG PO TABS: 0.2500 mg | ORAL_TABLET | Freq: Three times a day (TID) | ORAL | 0 refills | Status: DC

## 2016-02-24 NOTE — Telephone Encounter (Signed)
Printed prescription as instructed/contract/attached uds---patient informed to pickup hardcopy, complete contract and UDS.

## 2016-03-02 ENCOUNTER — Ambulatory Visit (INDEPENDENT_AMBULATORY_CARE_PROVIDER_SITE_OTHER): Payer: Medicare Other | Admitting: *Deleted

## 2016-03-02 DIAGNOSIS — Z5181 Encounter for therapeutic drug level monitoring: Secondary | ICD-10-CM | POA: Diagnosis not present

## 2016-03-02 DIAGNOSIS — I4891 Unspecified atrial fibrillation: Secondary | ICD-10-CM | POA: Diagnosis not present

## 2016-03-02 LAB — POCT INR: INR: 2.6

## 2016-03-16 ENCOUNTER — Encounter: Payer: Self-pay | Admitting: Family Medicine

## 2016-03-16 ENCOUNTER — Ambulatory Visit (INDEPENDENT_AMBULATORY_CARE_PROVIDER_SITE_OTHER): Payer: Medicare Other | Admitting: Family Medicine

## 2016-03-16 VITALS — BP 154/64 | HR 54 | Temp 97.7°F | Ht 59.0 in | Wt 106.4 lb

## 2016-03-16 DIAGNOSIS — I4819 Other persistent atrial fibrillation: Secondary | ICD-10-CM

## 2016-03-16 DIAGNOSIS — R739 Hyperglycemia, unspecified: Secondary | ICD-10-CM | POA: Diagnosis not present

## 2016-03-16 DIAGNOSIS — E782 Mixed hyperlipidemia: Secondary | ICD-10-CM | POA: Diagnosis not present

## 2016-03-16 DIAGNOSIS — Z0000 Encounter for general adult medical examination without abnormal findings: Secondary | ICD-10-CM | POA: Diagnosis not present

## 2016-03-16 DIAGNOSIS — E039 Hypothyroidism, unspecified: Secondary | ICD-10-CM | POA: Diagnosis not present

## 2016-03-16 DIAGNOSIS — E559 Vitamin D deficiency, unspecified: Secondary | ICD-10-CM

## 2016-03-16 DIAGNOSIS — E538 Deficiency of other specified B group vitamins: Secondary | ICD-10-CM | POA: Diagnosis not present

## 2016-03-16 DIAGNOSIS — I481 Persistent atrial fibrillation: Secondary | ICD-10-CM

## 2016-03-16 HISTORY — DX: Deficiency of other specified B group vitamins: E53.8

## 2016-03-16 LAB — COMPREHENSIVE METABOLIC PANEL
ALBUMIN: 3.9 g/dL (ref 3.5–5.2)
ALK PHOS: 17 U/L — AB (ref 39–117)
ALT: 13 U/L (ref 0–35)
AST: 21 U/L (ref 0–37)
BILIRUBIN TOTAL: 0.5 mg/dL (ref 0.2–1.2)
BUN: 11 mg/dL (ref 6–23)
CO2: 30 meq/L (ref 19–32)
CREATININE: 0.99 mg/dL (ref 0.40–1.20)
Calcium: 9.5 mg/dL (ref 8.4–10.5)
Chloride: 102 mEq/L (ref 96–112)
GFR: 55.98 mL/min — AB (ref >60.00)
GLUCOSE: 91 mg/dL (ref 70–99)
POTASSIUM: 4.1 meq/L (ref 3.5–5.1)
Sodium: 138 mEq/L (ref 135–145)
TOTAL PROTEIN: 7.1 g/dL (ref 6.0–8.3)

## 2016-03-16 LAB — LIPID PANEL
CHOL/HDL RATIO: 3
Cholesterol: 113 mg/dL (ref 0–200)
HDL: 38.5 mg/dL — ABNORMAL LOW (ref >39.00)
LDL CALC: 56 mg/dL (ref 0–99)
NonHDL: 74.73
Triglycerides: 92 mg/dL (ref 0.0–149.0)
VLDL: 18.4 mg/dL (ref 0.0–40.0)

## 2016-03-16 LAB — CBC
HEMATOCRIT: 37 % (ref 36.0–46.0)
HEMOGLOBIN: 12.6 g/dL (ref 12.0–15.0)
MCHC: 34 g/dL (ref 30.0–36.0)
MCV: 90.4 fl (ref 78.0–100.0)
PLATELETS: 218 10*3/uL (ref 150.0–400.0)
RBC: 4.09 Mil/uL (ref 3.87–5.11)
RDW: 14.5 % (ref 11.5–15.5)
WBC: 5.5 10*3/uL (ref 4.0–10.5)

## 2016-03-16 LAB — HEMOGLOBIN A1C: HEMOGLOBIN A1C: 5.9 % (ref 4.6–6.5)

## 2016-03-16 LAB — VITAMIN B12: Vitamin B-12: 889 pg/mL (ref 211–911)

## 2016-03-16 LAB — TSH: TSH: 2.75 u[IU]/mL (ref 0.35–4.50)

## 2016-03-16 LAB — VITAMIN D 25 HYDROXY (VIT D DEFICIENCY, FRACTURES): VITD: 48.17 ng/mL (ref 30.00–100.00)

## 2016-03-16 MED ORDER — RANITIDINE HCL 150 MG PO TABS: 150.0000 mg | ORAL_TABLET | Freq: Every day | ORAL | 5 refills | Status: DC

## 2016-03-16 MED ORDER — ALPRAZOLAM 0.25 MG PO TABS: 0.1250 mg | ORAL_TABLET | Freq: Two times a day (BID) | ORAL | 0 refills | Status: DC | PRN

## 2016-03-16 NOTE — Assessment & Plan Note (Signed)
Tolerating statin, encouraged heart healthy diet, avoid trans fats, minimize simple carbs and saturated fats. Increase exercise as tolerated 

## 2016-03-16 NOTE — Patient Instructions (Signed)
Preventive Care 65 Years and Older, Female Preventive care refers to lifestyle choices and visits with your health care provider that can promote health and wellness. What does preventive care include?  A yearly physical exam. This is also called an annual well check.  Dental exams once or twice a year.  Routine eye exams. Ask your health care provider how often you should have your eyes checked.  Personal lifestyle choices, including:  Daily care of your teeth and gums.  Regular physical activity.  Eating a healthy diet.  Avoiding tobacco and drug use.  Limiting alcohol use.  Practicing safe sex.  Taking low-dose aspirin every day.  Taking vitamin and mineral supplements as recommended by your health care provider. What happens during an annual well check? The services and screenings done by your health care provider during your annual well check will depend on your age, overall health, lifestyle risk factors, and family history of disease. Counseling  Your health care provider may ask you questions about your:  Alcohol use.  Tobacco use.  Drug use.  Emotional well-being.  Home and relationship well-being.  Sexual activity.  Eating habits.  History of falls.  Memory and ability to understand (cognition).  Work and work environment.  Reproductive health. Screening  You may have the following tests or measurements:  Height, weight, and BMI.  Blood pressure.  Lipid and cholesterol levels. These may be checked every 5 years, or more frequently if you are over 50 years old.  Skin check.  Lung cancer screening. You may have this screening every year starting at age 55 if you have a 30-pack-year history of smoking and currently smoke or have quit within the past 15 years.  Fecal occult blood test (FOBT) of the stool. You may have this test every year starting at age 50.  Flexible sigmoidoscopy or colonoscopy. You may have a sigmoidoscopy every 5 years or  a colonoscopy every 10 years starting at age 50.  Hepatitis C blood test.  Hepatitis B blood test.  Sexually transmitted disease (STD) testing.  Diabetes screening. This is done by checking your blood sugar (glucose) after you have not eaten for a while (fasting). You may have this done every 1-3 years.  Bone density scan. This is done to screen for osteoporosis. You may have this done starting at age 65.  Mammogram. This may be done every 1-2 years. Talk to your health care provider about how often you should have regular mammograms. Talk with your health care provider about your test results, treatment options, and if necessary, the need for more tests. Vaccines  Your health care provider may recommend certain vaccines, such as:  Influenza vaccine. This is recommended every year.  Tetanus, diphtheria, and acellular pertussis (Tdap, Td) vaccine. You may need a Td booster every 10 years.  Varicella vaccine. You may need this if you have not been vaccinated.  Zoster vaccine. You may need this after age 60.  Measles, mumps, and rubella (MMR) vaccine. You may need at least one dose of MMR if you were born in 1957 or later. You may also need a second dose.  Pneumococcal 13-valent conjugate (PCV13) vaccine. One dose is recommended after age 65.  Pneumococcal polysaccharide (PPSV23) vaccine. One dose is recommended after age 65.  Meningococcal vaccine. You may need this if you have certain conditions.  Hepatitis A vaccine. You may need this if you have certain conditions or if you travel or work in places where you may be exposed to   hepatitis A.  Hepatitis B vaccine. You may need this if you have certain conditions or if you travel or work in places where you may be exposed to hepatitis B.  Haemophilus influenzae type b (Hib) vaccine. You may need this if you have certain conditions. Talk to your health care provider about which screenings and vaccines you need and how often you need  them. This information is not intended to replace advice given to you by your health care provider. Make sure you discuss any questions you have with your health care provider. Document Released: 01/14/2015 Document Revised: 09/07/2015 Document Reviewed: 10/19/2014 Elsevier Interactive Patient Education  2017 [employer].  

## 2016-03-16 NOTE — Assessment & Plan Note (Signed)
Taking the Vitamin b12 every other day will recheck level today

## 2016-03-16 NOTE — Assessment & Plan Note (Signed)
Has intermittent episodes of palpitations when lying in bed at night, only lasts a short time and no associated symptoms will let us know if worsens

## 2016-03-16 NOTE — Assessment & Plan Note (Signed)
hgba1c acceptable, minimize simple carbs. Increase exercise as tolerated.  

## 2016-03-16 NOTE — Assessment & Plan Note (Signed)
Takes every other day will check level

## 2016-03-16 NOTE — Progress Notes (Signed)
Patient ID: [name] [name], female   DOB: Feb 03, 1924, 83 y.o.   MRN: [id number]   Subjective:  I acted as a Education administrator for Penni Homans, [place], [place]   Patient ID: [name] [name], female    DOB: 01/07/1924, 83 y.o.   MRN: [id number]  Chief Complaint  Patient presents with  . Annual Exam  . Anemia  . Hyperglycemia  . Gastroesophageal Reflux    Anemia  Presents for follow-up visit. Symptoms include malaise/fatigue. There has been no fever or palpitations.  Hyperglycemia  This is a chronic problem. Pertinent negatives include no chest pain, congestion, coughing, fever, headaches, rash or vomiting.  Gastroesophageal Reflux  She reports no chest pain or no coughing. This is a chronic problem.    Patient is in today for an annual examination. Patient states that she had an syncope episode on 02/21/2016. States that sometimes he vision gets a little blurry, but thinks it may be because of her doing a lot of reading. Patient has a Hx of hyperglycemia, anemia, GERD. Patient has no additional concerns noted at this time. She is here today accompanied by family and they report she is doing well. She does endorse feeling light headed at times upon arising but this is infrequent. He is eating well most days but does not always hydrate well. Denies CP/palp/SOB/HA/congestion/fevers/GI or GU c/o. Taking meds as prescribed  Patient Care Team: Mosie Lukes, MD as PCP - General (Family Medicine)   Past Medical History:  Diagnosis Date  . Anemia   . Anxiety   . Arthritis    hips, knees  . Atrial fibrillation ([place])   . Constipation   . Cough   . Current use of long term anticoagulation   . Diarrhea   . Diverticulitis of colon 02/06/2015  . Diverticulosis   . Dysrhythmia    a-fib  . Gait difficulty   . GERD (gastroesophageal reflux disease)   . Headache(784.0) 06/22/2012  . History of mammogram 2009  . Hyperkalemia 06/28/2012  . Hyperlipidemia   . Hyperlipidemia   . Hypertension   .  Hypertension   . IBS (irritable bowel syndrome)   . Incontinence   . Loss of weight 06/05/2015  . Medicare annual wellness visit, subsequent 02/05/2014  . Melena   . Mitral and aortic regurgitation   . Mitral valve prolapse    hx of  . Osteopenia   . Paroxysmal a-fib ([place])   . Personal history of colonic polyps   . Pneumonia   . Pneumonia    organism unspecified  . Preventative health care 03/18/2016  . Renal insufficiency 06/28/2012  . Renal lithiasis 06/26/2015  . Thyroid disease    hypo  . Tracheitis   . Unspecified adverse effect of other drug, medicinal and biological substance(995.29)   . UTI (lower urinary tract infection)   . Vitamin B12 deficiency 03/16/2016  . Vitamin D deficiency     Past Surgical History:  Procedure Laterality Date  . BOTOX INJECTION N/A 09/03/2012   Procedure: BOTOX INJECTION;  Surgeon: Inda Castle, MD;  Location: [facility];  Service: Endoscopy;  Laterality: N/A;  . BREAST BIOPSY Left   . BREAST LUMPECTOMY Right 08/18/2014   Procedure: RIGHT BREAST LUMPECTOMY;  Surgeon: Coralie Keens, MD;  Location: [facility];  Service: General;  Laterality: Right;  . CARDIAC ELECTROPHYSIOLOGY MAPPING AND ABLATION    . CATARACT EXTRACTION, BILATERAL    . ESOPHAGOGASTRODUODENOSCOPY N/A 09/03/2012   Procedure: ESOPHAGOGASTRODUODENOSCOPY (EGD);  Surgeon: Herbie Baltimore  Shaaron Adler, MD;  Location: [facility];  Service: Endoscopy;  Laterality: N/A;  . ESOPHAGOSCOPY W/ BOTOX INJECTION    . I&D EXTREMITY Right 11/06/2012   Procedure: IRRIGATION AND DEBRIDEMENT EXTREMITY Right Ring Finger;  Surgeon: Tennis Must, MD;  Location: [facility];  Service: Orthopedics;  Laterality: Right;  . PARTIAL HYSTERECTOMY     ovaries left in place    Family History  Problem Relation Age of Onset  . Diabetes Mother   . CVA Mother   . Stroke Mother   . COPD Father   . Rheum arthritis Father   . Allergies Daughter   . COPD Daughter   . Stroke Daughter   . COPD Daughter      previous smoker  . Stroke Maternal Grandfather   . Heart disease Maternal Aunt   . Heart disease Maternal Uncle   . Colon cancer Neg Hx   . Colon polyps Neg Hx   . Esophageal cancer Neg Hx   . Gallbladder disease Neg Hx   . Kidney disease Neg Hx   . Heart attack Neg Hx   . Hypertension Neg Hx     Social History   Social History  . Marital status: Widowed    Spouse name: N/A  . Number of children: 5  . Years of education: N/A   Occupational History  . retired Retired   Social History Main Topics  . Smoking status: Never Smoker  . Smokeless tobacco: Never Used  . Alcohol use No  . Drug use: No  . Sexual activity: Not on file     Comment: lives with Daughter, grandson and his family. no dietary restricitons.   Other Topics Concern  . Not on file   Social History Narrative  . No narrative on file    Outpatient Medications Prior to Visit  Medication Sig Dispense Refill  . atorvastatin (LIPITOR) 10 MG tablet TAKE 1 TABLET (10 MG TOTAL) BY MOUTH DAILY AT 6 PM. 30 tablet 4  . Cholecalciferol (VITAMIN D) 2000 UNITS CAPS Take 2,000 Units by mouth every other day.    . Lactobacillus-Inulin (PROBIOTIC DIGESTIVE SUPPORT PO) Take 1 capsule by mouth daily.    . metoprolol tartrate (LOPRESSOR) 25 MG tablet TAKE 0.5 TABLETS (12.5 MG TOTAL) BY MOUTH 2 TIMES DAILY. 90 tablet 1  . pantoprazole (PROTONIX) 40 MG tablet TAKE 1 TABLET (40 MG TOTAL) BY MOUTH DAILY. 90 tablet 3  . ramipril (ALTACE) 10 MG capsule TAKE 1 CAPSULE (10 MG TOTAL) BY MOUTH DAILY. 90 capsule 1  . vitamin B-12 (CYANOCOBALAMIN) 1000 MCG tablet Take 1 tablet (1,000 mcg total) by mouth daily.    Marland Kitchen warfarin (COUMADIN) 2.5 MG tablet TAKE AS DIRECTED BY ANTICOAGULATION CLINIC 40 tablet 3  . ALPRAZolam (XANAX) 0.25 MG tablet Take 1 tablet (0.25 mg total) by mouth 3 (three) times daily. 90 tablet 0   No facility-administered medications prior to visit.     Allergies  Allergen Reactions  . Darifenacin Hydrobromide      REACTION: causes her dizziness  . Sulfonamide Derivatives   . Tramadol Other (See Comments)    Insomnia, anorexia    Review of Systems  Constitutional: Positive for malaise/fatigue. Negative for fever.  HENT: Negative for congestion.   Eyes: Positive for blurred vision.  Respiratory: Negative for cough and shortness of breath.   Cardiovascular: Negative for chest pain, palpitations and leg swelling.  Gastrointestinal: Negative for vomiting.  Musculoskeletal: Negative for back pain.  Skin: Negative for rash.  Neurological: Positive for dizziness. Negative for loss of consciousness and headaches.       Syncope.       Objective:    Physical Exam  Constitutional: She is oriented to person, place, and time. She appears well-developed and well-nourished. No distress.  HENT:  Head: Normocephalic and atraumatic.  Eyes: Conjunctivae are normal.  Neck: Normal range of motion. No thyromegaly present.  Cardiovascular: Normal rate.   Pulmonary/Chest: Effort normal and breath sounds normal. She has no wheezes.  Abdominal: Soft. Bowel sounds are normal. She exhibits no distension and no mass. There is no tenderness. There is no rebound and no guarding.  Musculoskeletal: She exhibits no edema or deformity.  Neurological: She is alert and oriented to person, place, and time.  Skin: Skin is warm and dry. She is not diaphoretic.  Psychiatric: She has a normal mood and affect.    BP (!) 154/64   Pulse (!) 54   Temp 97.7 F (36.5 C) (Oral)   Ht 4\' 11"  (1.499 m)   Wt 106 lb 6.4 oz (48.3 kg)   SpO2 98% Comment: RA  BMI 21.49 kg/m  Wt Readings from Last 3 Encounters:  03/16/16 106 lb 6.4 oz (48.3 kg)  02/21/16 106 lb (48.1 kg)  11/10/15 106 lb (48.1 kg)      Immunization History  Administered Date(s) Administered  . Influenza Whole 09/18/2007, 10/02/2010, 09/15/2012  . Influenza,inj,Quad PF,36+ Mos 08/24/2013, 08/31/2014  . Pneumococcal Conjugate-13 08/31/2014  . Pneumococcal  Polysaccharide-23 02/20/2006  . Tdap 10/30/2010    Health Maintenance  Topic Date Due  . Samul Dada  10/29/2020  . INFLUENZA VACCINE  Addressed  . DEXA SCAN  Completed  . PNA vac Low Risk Adult  Completed    Lab Results  Component Value Date   WBC 5.5 03/16/2016   HGB 12.6 03/16/2016   HCT 37.0 03/16/2016   PLT 218.0 03/16/2016   GLUCOSE 91 03/16/2016   CHOL 113 03/16/2016   TRIG 92.0 03/16/2016   HDL 38.50 (L) 03/16/2016   LDLDIRECT 72.1 01/19/2009   LDLCALC 56 03/16/2016   ALT 13 03/16/2016   AST 21 03/16/2016   NA 138 03/16/2016   K 4.1 03/16/2016   CL 102 03/16/2016   CREATININE 0.99 03/16/2016   BUN 11 03/16/2016   CO2 30 03/16/2016   TSH 2.75 03/16/2016   INR 2.6 03/02/2016   HGBA1C 5.9 03/16/2016    Lab Results  Component Value Date   TSH 2.75 03/16/2016   Lab Results  Component Value Date   WBC 5.5 03/16/2016   HGB 12.6 03/16/2016   HCT 37.0 03/16/2016   MCV 90.4 03/16/2016   PLT 218.0 03/16/2016   Lab Results  Component Value Date   NA 138 03/16/2016   K 4.1 03/16/2016   CO2 30 03/16/2016   GLUCOSE 91 03/16/2016   BUN 11 03/16/2016   CREATININE 0.99 03/16/2016   BILITOT 0.5 03/16/2016   ALKPHOS 17 (L) 03/16/2016   AST 21 03/16/2016   ALT 13 03/16/2016   PROT 7.1 03/16/2016   ALBUMIN 3.9 03/16/2016   CALCIUM 9.5 03/16/2016   ANIONGAP 7 02/21/2016   GFR 55.98 (L) 03/16/2016   Lab Results  Component Value Date   CHOL 113 03/16/2016   Lab Results  Component Value Date   HDL 38.50 (L) 03/16/2016   Lab Results  Component Value Date   LDLCALC 56 03/16/2016   Lab Results  Component Value Date   TRIG 92.0 03/16/2016   Lab  Results  Component Value Date   CHOLHDL 3 03/16/2016   Lab Results  Component Value Date   HGBA1C 5.9 03/16/2016         Assessment & Plan:   Problem List Items Addressed This Visit    Hypothyroidism    On Levothyroxine, continue to monitor      Relevant Orders   TSH (Completed)   Vitamin D  deficiency    Takes every other day will check level      Relevant Orders   VITAMIN D 25 Hydroxy (Vit-D Deficiency, Fractures) (Completed)   Hyperlipidemia, mixed    Tolerating statin, encouraged heart healthy diet, avoid trans fats, minimize simple carbs and saturated fats. Increase exercise as tolerated      Relevant Orders   Lipid panel (Completed)   PAROXYSMAL ATRIAL FIBRILLATION    Has intermittent episodes of palpitations when lying in bed at night, only lasts a short time and no associated symptoms will let us know if worsens      Relevant Orders   CBC (Completed)   Comprehensive metabolic panel (Completed)   Hyperglycemia    hgba1c acceptable, minimize simple carbs. Increase exercise as tolerated.       Relevant Orders   Hemoglobin A1c (Completed)   Vitamin B12 deficiency    Taking the Vitamin b12 every other day will recheck level today      Relevant Orders   Vitamin B12 (Completed)   Preventative health care    Patient encouraged to maintain heart healthy diet, regular exercise, adequate sleep. Consider daily probiotics. Take medications as prescribed         I have changed Ms. Steven's ALPRAZolam. I am also having her start on ranitidine. Additionally, I am having her maintain her vitamin B-12, Vitamin D, pantoprazole, metoprolol tartrate, Lactobacillus-Inulin (PROBIOTIC DIGESTIVE SUPPORT PO), warfarin, atorvastatin, and ramipril.  Meds ordered this encounter  Medications  . ranitidine (ZANTAC) 150 MG tablet    Sig: Take 1 tablet (150 mg total) by mouth at bedtime.    Dispense:  30 tablet    Refill:  5  . ALPRAZolam (XANAX) 0.25 MG tablet    Sig: Take 0.5-1 tablets (0.125-0.25 mg total) by mouth 2 (two) times daily as needed for anxiety.    Dispense:  90 tablet    Refill:  0    Not to exceed 5 additional fills before 06/02/2016    CMA served as scribe during this visit. History, Physical and Plan performed by medical provider. Documentation and orders  reviewed and attested to.  Penni Homans, MD

## 2016-03-16 NOTE — Progress Notes (Signed)
Pre visit review using our clinic review tool, if applicable. No additional management support is needed unless otherwise documented below in the visit note. 

## 2016-03-16 NOTE — Assessment & Plan Note (Signed)
On Levothyroxine, continue to monitor 

## 2016-03-18 ENCOUNTER — Encounter: Payer: Self-pay | Admitting: Family Medicine

## 2016-03-18 DIAGNOSIS — Z0000 Encounter for general adult medical examination without abnormal findings: Secondary | ICD-10-CM | POA: Insufficient documentation

## 2016-03-18 NOTE — Assessment & Plan Note (Signed)
Patient encouraged to maintain heart healthy diet, regular exercise, adequate sleep. Consider daily probiotics. Take medications as prescribed 

## 2016-03-29 ENCOUNTER — Other Ambulatory Visit: Payer: Self-pay | Admitting: Family Medicine

## 2016-03-29 MED ORDER — METOPROLOL TARTRATE 25 MG PO TABS: ORAL_TABLET | ORAL | 1 refills | Status: DC

## 2016-04-13 ENCOUNTER — Ambulatory Visit (INDEPENDENT_AMBULATORY_CARE_PROVIDER_SITE_OTHER): Payer: Medicare Other

## 2016-04-13 DIAGNOSIS — Z5181 Encounter for therapeutic drug level monitoring: Secondary | ICD-10-CM | POA: Diagnosis not present

## 2016-04-13 DIAGNOSIS — I4891 Unspecified atrial fibrillation: Secondary | ICD-10-CM | POA: Diagnosis not present

## 2016-04-13 LAB — POCT INR: INR: 2.9

## 2016-05-09 ENCOUNTER — Other Ambulatory Visit: Payer: Self-pay | Admitting: *Deleted

## 2016-05-09 MED ORDER — WARFARIN SODIUM 2.5 MG PO TABS: ORAL_TABLET | ORAL | 3 refills | Status: DC

## 2016-05-09 NOTE — Telephone Encounter (Signed)
Refill done as requested 

## 2016-05-10 ENCOUNTER — Other Ambulatory Visit: Payer: Self-pay | Admitting: *Deleted

## 2016-05-10 MED ORDER — WARFARIN SODIUM 2.5 MG PO TABS: ORAL_TABLET | ORAL | 3 refills | Status: DC

## 2016-05-25 ENCOUNTER — Ambulatory Visit (INDEPENDENT_AMBULATORY_CARE_PROVIDER_SITE_OTHER): Payer: Medicare Other | Admitting: *Deleted

## 2016-05-25 DIAGNOSIS — I4891 Unspecified atrial fibrillation: Secondary | ICD-10-CM

## 2016-05-25 DIAGNOSIS — Z5181 Encounter for therapeutic drug level monitoring: Secondary | ICD-10-CM | POA: Diagnosis not present

## 2016-05-25 LAB — POCT INR: INR: 4.2

## 2016-06-03 ENCOUNTER — Other Ambulatory Visit: Payer: Self-pay | Admitting: Family Medicine

## 2016-06-07 ENCOUNTER — Ambulatory Visit (INDEPENDENT_AMBULATORY_CARE_PROVIDER_SITE_OTHER): Payer: Medicare Other

## 2016-06-07 DIAGNOSIS — I4891 Unspecified atrial fibrillation: Secondary | ICD-10-CM

## 2016-06-07 DIAGNOSIS — Z5181 Encounter for therapeutic drug level monitoring: Secondary | ICD-10-CM

## 2016-06-07 LAB — POCT INR: INR: 2.8

## 2016-06-08 ENCOUNTER — Encounter: Payer: Self-pay | Admitting: Cardiology

## 2016-06-14 ENCOUNTER — Other Ambulatory Visit: Payer: Self-pay | Admitting: Family Medicine

## 2016-06-14 NOTE — Telephone Encounter (Signed)
Faxed to [facility] in [place]

## 2016-06-14 NOTE — Telephone Encounter (Signed)
Refill Request: Alprazolam   Last RX:03/16/16 Last OV:03/16/16 Next OV:06/22/16 UDS:02/24/16 CSC:02/24/16

## 2016-06-14 NOTE — Telephone Encounter (Signed)
OK to reill with 1 rf

## 2016-06-22 ENCOUNTER — Encounter: Payer: Self-pay | Admitting: Family Medicine

## 2016-06-22 ENCOUNTER — Ambulatory Visit (INDEPENDENT_AMBULATORY_CARE_PROVIDER_SITE_OTHER): Payer: Medicare Other | Admitting: Family Medicine

## 2016-06-22 VITALS — BP 140/72 | HR 80 | Temp 97.8°F | Ht 59.0 in | Wt 103.2 lb

## 2016-06-22 DIAGNOSIS — E782 Mixed hyperlipidemia: Secondary | ICD-10-CM | POA: Diagnosis not present

## 2016-06-22 DIAGNOSIS — I10 Essential (primary) hypertension: Secondary | ICD-10-CM

## 2016-06-22 DIAGNOSIS — D649 Anemia, unspecified: Secondary | ICD-10-CM

## 2016-06-22 DIAGNOSIS — E538 Deficiency of other specified B group vitamins: Secondary | ICD-10-CM

## 2016-06-22 DIAGNOSIS — E559 Vitamin D deficiency, unspecified: Secondary | ICD-10-CM | POA: Diagnosis not present

## 2016-06-22 DIAGNOSIS — R739 Hyperglycemia, unspecified: Secondary | ICD-10-CM

## 2016-06-22 DIAGNOSIS — R001 Bradycardia, unspecified: Secondary | ICD-10-CM

## 2016-06-22 MED ORDER — VITAMIN B-12 500 MCG SL SUBL: 500.0000 ug | SUBLINGUAL_TABLET | Freq: Every day | SUBLINGUAL | Status: DC

## 2016-06-22 NOTE — Assessment & Plan Note (Signed)
hgba1c acceptable, minimize simple carbs. Increase exercise as tolerated.  

## 2016-06-22 NOTE — Assessment & Plan Note (Signed)
Taking daily supplements recheck at next visit

## 2016-06-22 NOTE — Patient Instructions (Signed)

## 2016-06-22 NOTE — Assessment & Plan Note (Signed)
Tolerating statin, encouraged heart healthy diet, avoid trans fats, minimize simple carbs and saturated fats. Increase exercise as tolerated 

## 2016-06-22 NOTE — Assessment & Plan Note (Signed)
Resolved, recheck with next visit.

## 2016-06-22 NOTE — Assessment & Plan Note (Signed)
Taking 500 mcg SL daily recheck with next visit.

## 2016-06-24 NOTE — Assessment & Plan Note (Signed)
Regular rate today

## 2016-06-24 NOTE — Progress Notes (Signed)
Subjective:    Patient ID: [name] [name], female    DOB: 02-Jul-1924, 83 y.o.   MRN: [id number]  Chief Complaint  Patient presents with  . Follow-up    Pt here for 3 month f/u visit.     HPI Patient is in today for follow up. She feels well today but has been struggling with painful new teeth and so has lost some weight. She is a light eater at baseline but she does get hungry and eat each day. No recent febrile illness or hospitalization. Denies CP/palp/SOB/HA/congestion/fevers/GI or GU c/o. Taking meds as prescribed  Past Medical History:  Diagnosis Date  . Anemia   . Anxiety   . Arthritis    hips, knees  . Atrial fibrillation ([place])   . Constipation   . Cough   . Current use of long term anticoagulation   . Diarrhea   . Diverticulitis of colon 02/06/2015  . Diverticulosis   . Dysrhythmia    a-fib  . Gait difficulty   . GERD (gastroesophageal reflux disease)   . Headache(784.0) 06/22/2012  . History of mammogram 2009  . Hyperkalemia 06/28/2012  . Hyperlipidemia   . Hyperlipidemia   . Hypertension   . Hypertension   . IBS (irritable bowel syndrome)   . Incontinence   . Loss of weight 06/05/2015  . Medicare annual wellness visit, subsequent 02/05/2014  . Melena   . Mitral and aortic regurgitation   . Mitral valve prolapse    hx of  . Osteopenia   . Paroxysmal A-fib ([place])   . Personal history of colonic polyps   . Pneumonia   . Pneumonia    organism unspecified  . Preventative health care 03/18/2016  . Renal insufficiency 06/28/2012  . Renal lithiasis 06/26/2015  . Thyroid disease    hypo  . Tracheitis   . Unspecified adverse effect of other drug, medicinal and biological substance(995.29)   . UTI (lower urinary tract infection)   . Vitamin B12 deficiency 03/16/2016  . Vitamin D deficiency     Past Surgical History:  Procedure Laterality Date  . BOTOX INJECTION N/A 09/03/2012   Procedure: BOTOX INJECTION;  Surgeon: Inda Castle, MD;  Location: [facility];   Service: Endoscopy;  Laterality: N/A;  . BREAST BIOPSY Left   . BREAST LUMPECTOMY Right 08/18/2014   Procedure: RIGHT BREAST LUMPECTOMY;  Surgeon: Coralie Keens, MD;  Location: [facility];  Service: General;  Laterality: Right;  . CARDIAC ELECTROPHYSIOLOGY MAPPING AND ABLATION    . CATARACT EXTRACTION, BILATERAL    . ESOPHAGOGASTRODUODENOSCOPY N/A 09/03/2012   Procedure: ESOPHAGOGASTRODUODENOSCOPY (EGD);  Surgeon: Inda Castle, MD;  Location: [facility];  Service: Endoscopy;  Laterality: N/A;  . ESOPHAGOSCOPY W/ BOTOX INJECTION    . I&D EXTREMITY Right 11/06/2012   Procedure: IRRIGATION AND DEBRIDEMENT EXTREMITY Right Ring Finger;  Surgeon: Tennis Must, MD;  Location: [facility];  Service: Orthopedics;  Laterality: Right;  . PARTIAL HYSTERECTOMY     ovaries left in place    Family History  Problem Relation Age of Onset  . Diabetes Mother   . CVA Mother   . Stroke Mother   . COPD Father   . Rheum arthritis Father   . Allergies Daughter   . COPD Daughter   . Stroke Daughter   . COPD Daughter        previous smoker  . Stroke Maternal Grandfather   . Heart disease Maternal Aunt   . Heart disease Maternal Uncle   .  Colon cancer Neg Hx   . Colon polyps Neg Hx   . Esophageal cancer Neg Hx   . Gallbladder disease Neg Hx   . Kidney disease Neg Hx   . Heart attack Neg Hx   . Hypertension Neg Hx     Social History   Social History  . Marital status: Widowed    Spouse name: N/A  . Number of children: 5  . Years of education: N/A   Occupational History  . retired Retired   Social History Main Topics  . Smoking status: Never Smoker  . Smokeless tobacco: Never Used  . Alcohol use No  . Drug use: No  . Sexual activity: Not on file     Comment: lives with Daughter, grandson and his family. no dietary restricitons.   Other Topics Concern  . Not on file   Social History Narrative  . No narrative on file    Outpatient Medications Prior to Visit    Medication Sig Dispense Refill  . ALPRAZolam (XANAX) 0.25 MG tablet TAKE 1/2 TO 1 TABLET BY MOUTH TWICE A DAY AS NEEDED FOR ANXIETY 90 tablet 0  . atorvastatin (LIPITOR) 10 MG tablet TAKE 1 TABLET (10 MG TOTAL) BY MOUTH DAILY AT 6 PM. 30 tablet 2  . Cholecalciferol (VITAMIN D) 2000 UNITS CAPS Take 2,000 Units by mouth every other day.    . Lactobacillus-Inulin (PROBIOTIC DIGESTIVE SUPPORT PO) Take 1 capsule by mouth daily.    . metoprolol tartrate (LOPRESSOR) 25 MG tablet Take 0.5 tablets (12.5 mg total) by mouth 2 times daily 90 tablet 1  . pantoprazole (PROTONIX) 40 MG tablet TAKE 1 TABLET (40 MG TOTAL) BY MOUTH DAILY. 90 tablet 3  . ramipril (ALTACE) 10 MG capsule TAKE 1 CAPSULE (10 MG TOTAL) BY MOUTH DAILY. 90 capsule 1  . ranitidine (ZANTAC) 150 MG tablet Take 1 tablet (150 mg total) by mouth at bedtime. 30 tablet 5  . warfarin (COUMADIN) 2.5 MG tablet Take as directed by coumadin clinic 40 tablet 3  . vitamin B-12 (CYANOCOBALAMIN) 1000 MCG tablet Take 1 tablet (1,000 mcg total) by mouth daily.     No facility-administered medications prior to visit.     Allergies  Allergen Reactions  . Darifenacin Hydrobromide     REACTION: causes her dizziness  . Sulfonamide Derivatives   . Tramadol Other (See Comments)    Insomnia, anorexia    Review of Systems  Constitutional: Negative for fever and malaise/fatigue.  HENT: Negative for congestion.   Eyes: Negative for blurred vision.  Respiratory: Negative for shortness of breath.   Cardiovascular: Negative for chest pain, palpitations and leg swelling.  Gastrointestinal: Negative for abdominal pain, blood in stool and nausea.  Genitourinary: Negative for dysuria and frequency.  Musculoskeletal: Negative for falls.  Skin: Negative for rash.  Neurological: Negative for dizziness, loss of consciousness and headaches.  Endo/Heme/Allergies: Negative for environmental allergies.  Psychiatric/Behavioral: Negative for depression. The patient  is not nervous/anxious.        Objective:    Physical Exam  Constitutional: She is oriented to person, place, and time. She appears well-developed and well-nourished. No distress.  HENT:  Head: Normocephalic and atraumatic.  Nose: Nose normal.  Eyes: Right eye exhibits no discharge. Left eye exhibits no discharge.  Neck: Normal range of motion. Neck supple.  Cardiovascular: Normal rate.   No murmur heard. Pulmonary/Chest: Effort normal and breath sounds normal.  Abdominal: Soft. Bowel sounds are normal. There is no tenderness.  Musculoskeletal: She exhibits  no edema.  Neurological: She is alert and oriented to person, place, and time.  Skin: Skin is warm and dry.  Psychiatric: She has a normal mood and affect.  Nursing note and vitals reviewed.   BP 140/72   Pulse 80   Temp 97.8 F (36.6 C) (Oral)   Ht 4\' 11"  (1.499 m)   Wt 103 lb 3.2 oz (46.8 kg)   SpO2 98%   BMI 20.84 kg/m  Wt Readings from Last 3 Encounters:  06/22/16 103 lb 3.2 oz (46.8 kg)  03/16/16 106 lb 6.4 oz (48.3 kg)  02/21/16 106 lb (48.1 kg)     Lab Results  Component Value Date   WBC 5.5 03/16/2016   HGB 12.6 03/16/2016   HCT 37.0 03/16/2016   PLT 218.0 03/16/2016   GLUCOSE 91 03/16/2016   CHOL 113 03/16/2016   TRIG 92.0 03/16/2016   HDL 38.50 (L) 03/16/2016   LDLDIRECT 72.1 01/19/2009   LDLCALC 56 03/16/2016   ALT 13 03/16/2016   AST 21 03/16/2016   NA 138 03/16/2016   K 4.1 03/16/2016   CL 102 03/16/2016   CREATININE 0.99 03/16/2016   BUN 11 03/16/2016   CO2 30 03/16/2016   TSH 2.75 03/16/2016   INR 2.8 06/07/2016   HGBA1C 5.9 03/16/2016    Lab Results  Component Value Date   TSH 2.75 03/16/2016   Lab Results  Component Value Date   WBC 5.5 03/16/2016   HGB 12.6 03/16/2016   HCT 37.0 03/16/2016   MCV 90.4 03/16/2016   PLT 218.0 03/16/2016   Lab Results  Component Value Date   NA 138 03/16/2016   K 4.1 03/16/2016   CO2 30 03/16/2016   GLUCOSE 91 03/16/2016   BUN 11  03/16/2016   CREATININE 0.99 03/16/2016   BILITOT 0.5 03/16/2016   ALKPHOS 17 (L) 03/16/2016   AST 21 03/16/2016   ALT 13 03/16/2016   PROT 7.1 03/16/2016   ALBUMIN 3.9 03/16/2016   CALCIUM 9.5 03/16/2016   ANIONGAP 7 02/21/2016   GFR 55.98 (L) 03/16/2016   Lab Results  Component Value Date   CHOL 113 03/16/2016   Lab Results  Component Value Date   HDL 38.50 (L) 03/16/2016   Lab Results  Component Value Date   LDLCALC 56 03/16/2016   Lab Results  Component Value Date   TRIG 92.0 03/16/2016   Lab Results  Component Value Date   CHOLHDL 3 03/16/2016   Lab Results  Component Value Date   HGBA1C 5.9 03/16/2016       Assessment & Plan:   Problem List Items Addressed This Visit    Vitamin D deficiency - Primary    Taking daily supplements recheck at next visit      Relevant Orders   VITAMIN D 25 Hydroxy (Vit-D Deficiency, Fractures)   Hyperlipidemia, mixed    Tolerating statin, encouraged heart healthy diet, avoid trans fats, minimize simple carbs and saturated fats. Increase exercise as tolerated      Relevant Orders   Lipid panel   Anemia    Resolved, recheck with next visit.      Relevant Medications   Cyanocobalamin (VITAMIN B-12) 500 MCG SUBL   Essential hypertension   Relevant Orders   CBC   Comprehensive metabolic panel   TSH   Sinus bradycardia    Regular rate today      Hyperglycemia    hgba1c acceptable, minimize simple carbs. Increase exercise as tolerated.  Relevant Orders   Hemoglobin A1c   Vitamin B12 deficiency    Taking 500 mcg SL daily recheck with next visit.      Relevant Orders   Vitamin B12      I have discontinued Ms. Cheyney's vitamin B-12. I am also having her start on Vitamin B-12. Additionally, I am having her maintain her Vitamin D, pantoprazole, Lactobacillus-Inulin (PROBIOTIC DIGESTIVE SUPPORT PO), ramipril, ranitidine, metoprolol tartrate, warfarin, atorvastatin, and ALPRAZolam.  Meds ordered this  encounter  Medications  . Cyanocobalamin (VITAMIN B-12) 500 MCG SUBL    Sig: Place 1 tablet (500 mcg total) under the tongue daily at 2 PM.    Dispense:  150 tablet    Penni Homans, MD

## 2016-06-27 ENCOUNTER — Ambulatory Visit (INDEPENDENT_AMBULATORY_CARE_PROVIDER_SITE_OTHER): Payer: Medicare Other | Admitting: *Deleted

## 2016-06-27 DIAGNOSIS — Z5181 Encounter for therapeutic drug level monitoring: Secondary | ICD-10-CM

## 2016-06-27 DIAGNOSIS — I4891 Unspecified atrial fibrillation: Secondary | ICD-10-CM | POA: Diagnosis not present

## 2016-06-27 LAB — POCT INR: INR: 2.8

## 2016-06-28 ENCOUNTER — Ambulatory Visit: Payer: Medicare Other | Admitting: Cardiology

## 2016-07-16 ENCOUNTER — Other Ambulatory Visit: Payer: Self-pay | Admitting: Cardiology

## 2016-07-17 ENCOUNTER — Other Ambulatory Visit: Payer: Self-pay | Admitting: Family Medicine

## 2016-07-19 ENCOUNTER — Encounter: Payer: Self-pay | Admitting: Cardiology

## 2016-07-19 ENCOUNTER — Ambulatory Visit (INDEPENDENT_AMBULATORY_CARE_PROVIDER_SITE_OTHER): Payer: Medicare Other

## 2016-07-19 ENCOUNTER — Ambulatory Visit (INDEPENDENT_AMBULATORY_CARE_PROVIDER_SITE_OTHER): Payer: Medicare Other | Admitting: Cardiology

## 2016-07-19 VITALS — BP 160/80 | HR 76 | Ht 59.0 in | Wt 102.8 lb

## 2016-07-19 DIAGNOSIS — I482 Chronic atrial fibrillation, unspecified: Secondary | ICD-10-CM

## 2016-07-19 DIAGNOSIS — I4891 Unspecified atrial fibrillation: Secondary | ICD-10-CM

## 2016-07-19 DIAGNOSIS — Z5181 Encounter for therapeutic drug level monitoring: Secondary | ICD-10-CM

## 2016-07-19 LAB — POCT INR: INR: 3.1

## 2016-07-19 MED ORDER — METOPROLOL TARTRATE 25 MG PO TABS: 25.0000 mg | ORAL_TABLET | Freq: Two times a day (BID) | ORAL | 1 refills | Status: DC

## 2016-07-19 NOTE — Patient Instructions (Signed)
Medication Instructions:  Your physician has recommended you make the following change in your medication: increase your metoprolol to 25 mg twice a day.     Labwork: None Ordered   Testing/Procedures: None Ordered   Follow-Up: Your physician recommends that you schedule a follow-up appointment in: same day as next coumadin visit patient needs nurse visit for BP and heart rate check.   Any Other Special Instructions Will Be Listed Below (If Applicable).  Please call us if your top blood pressure number goes below 100 or if your heart rate is below 60   If you need a refill on your cardiac medications before your next appointment, please call your pharmacy.

## 2016-07-19 NOTE — Progress Notes (Signed)
07/19/2016 [name] [name]   02/11/1924  [id number]  Primary Physician Mosie Lukes, MD Primary Cardiologist: Dr. [name]    Reason for Visit/CC: F/u for Atrial Fibrillation   HPI:  [name] [name] is a 83 y.o. female who is being seen today for f/u for chronic atrial fibrillation. She is followed by Dr. [name] and is on chronic a/c with coumadin. Her INRs are followed in our coumadin clinic. She also has a h/o mild AI and mild MR w/ MVP. Her last 2D echo in 2014 showed normal LVEF at 55-60%. She also has a h/o treated HTN and HLD. Her last OV with Dr. [name] was 11/2016. She was stable and instructed to f/u in 1 year.  She presents to clinic a few months early. She has concerns regarding increase in HR and more frequent palpitations. She is currently asymptomatic. HF in the mid 70s. She reports full compliance with metoprolol but is on low dose, 12.5 mg BID. Her BP is elevated at 160/80. She avoids caffeine. No ETOH. She admits that she does not drink plenty of fluids.   She denies CP, dyspnea, dizziness, syncope./ near syncope, abnormal bleeding and falls.   Current Meds  Medication Sig  . ALPRAZolam (XANAX) 0.25 MG tablet TAKE 1/2 TO 1 TABLET BY MOUTH TWICE A DAY AS NEEDED FOR ANXIETY  . atorvastatin (LIPITOR) 10 MG tablet TAKE 1 TABLET (10 MG TOTAL) BY MOUTH DAILY AT 6 PM.  . Cholecalciferol (VITAMIN D) 2000 UNITS CAPS Take 2,000 Units by mouth every other day.  . Cyanocobalamin (VITAMIN B-12) 500 MCG SUBL Place 1 tablet (500 mcg total) under the tongue daily at 2 PM.  . Lactobacillus-Inulin (PROBIOTIC DIGESTIVE SUPPORT PO) Take 1 capsule by mouth daily.  . metoprolol tartrate (LOPRESSOR) 25 MG tablet Take 1 tablet (25 mg total) by mouth 2 (two) times daily.  . pantoprazole (PROTONIX) 40 MG tablet TAKE 1 TABLET (40 MG TOTAL) BY MOUTH DAILY.  . ramipril (ALTACE) 10 MG capsule TAKE 1 CAPSULE (10 MG TOTAL) BY MOUTH DAILY.  . ranitidine (ZANTAC) 150 MG tablet Take 1 tablet (150 mg total)  by mouth at bedtime.  Marland Kitchen warfarin (COUMADIN) 2.5 MG tablet TAKE AS DIRECTED BY ANTICOAGULATION CLINIC. A 30 DAY SUPPLY  . [DISCONTINUED] metoprolol tartrate (LOPRESSOR) 25 MG tablet Take 0.5 tablets (12.5 mg total) by mouth 2 times daily   Allergies  Allergen Reactions  . Darifenacin Hydrobromide     REACTION: causes her dizziness  . Sulfonamide Derivatives   . Tramadol Other (See Comments)    Insomnia, anorexia   Past Medical History:  Diagnosis Date  . Anemia   . Anxiety   . Arthritis    hips, knees  . Atrial fibrillation ([place])   . Constipation   . Cough   . Current use of long term anticoagulation   . Diarrhea   . Diverticulitis of colon 02/06/2015  . Diverticulosis   . Dysrhythmia    a-fib  . Gait difficulty   . GERD (gastroesophageal reflux disease)   . Headache(784.0) 06/22/2012  . History of mammogram 2009  . Hyperkalemia 06/28/2012  . Hyperlipidemia   . Hyperlipidemia   . Hypertension   . Hypertension   . IBS (irritable bowel syndrome)   . Incontinence   . Loss of weight 06/05/2015  . Medicare annual wellness visit, subsequent 02/05/2014  . Melena   . Mitral and aortic regurgitation   . Mitral valve prolapse    hx of  . Osteopenia   .  Paroxysmal A-fib ([place])   . Personal history of colonic polyps   . Pneumonia   . Pneumonia    organism unspecified  . Preventative health care 03/18/2016  . Renal insufficiency 06/28/2012  . Renal lithiasis 06/26/2015  . Thyroid disease    hypo  . Tracheitis   . Unspecified adverse effect of other drug, medicinal and biological substance(995.29)   . UTI (lower urinary tract infection)   . Vitamin B12 deficiency 03/16/2016  . Vitamin D deficiency    Family History  Problem Relation Age of Onset  . Diabetes Mother   . CVA Mother   . Stroke Mother   . COPD Father   . Rheum arthritis Father   . Allergies Daughter   . COPD Daughter   . Stroke Daughter   . COPD Daughter        previous smoker  . Stroke Maternal Grandfather     . Heart disease Maternal Aunt   . Heart disease Maternal Uncle   . Colon cancer Neg Hx   . Colon polyps Neg Hx   . Esophageal cancer Neg Hx   . Gallbladder disease Neg Hx   . Kidney disease Neg Hx   . Heart attack Neg Hx   . Hypertension Neg Hx    Past Surgical History:  Procedure Laterality Date  . BOTOX INJECTION N/A 09/03/2012   Procedure: BOTOX INJECTION;  Surgeon: Inda Castle, MD;  Location: [facility];  Service: Endoscopy;  Laterality: N/A;  . BREAST BIOPSY Left   . BREAST LUMPECTOMY Right 08/18/2014   Procedure: RIGHT BREAST LUMPECTOMY;  Surgeon: Coralie Keens, MD;  Location: [facility];  Service: General;  Laterality: Right;  . CARDIAC ELECTROPHYSIOLOGY MAPPING AND ABLATION    . CATARACT EXTRACTION, BILATERAL    . ESOPHAGOGASTRODUODENOSCOPY N/A 09/03/2012   Procedure: ESOPHAGOGASTRODUODENOSCOPY (EGD);  Surgeon: Inda Castle, MD;  Location: [facility];  Service: Endoscopy;  Laterality: N/A;  . ESOPHAGOSCOPY W/ BOTOX INJECTION    . I&D EXTREMITY Right 11/06/2012   Procedure: IRRIGATION AND DEBRIDEMENT EXTREMITY Right Ring Finger;  Surgeon: Tennis Must, MD;  Location: [facility];  Service: Orthopedics;  Laterality: Right;  . PARTIAL HYSTERECTOMY     ovaries left in place   Social History   Social History  . Marital status: Widowed    Spouse name: N/A  . Number of children: 5  . Years of education: N/A   Occupational History  . retired Retired   Social History Main Topics  . Smoking status: Never Smoker  . Smokeless tobacco: Never Used  . Alcohol use No  . Drug use: No  . Sexual activity: Not on file     Comment: lives with Daughter, grandson and his family. no dietary restricitons.   Other Topics Concern  . Not on file   Social History Narrative  . No narrative on file     Review of Systems: General: negative for chills, fever, night sweats or weight changes.  Cardiovascular: negative for chest pain, dyspnea on exertion, edema,  orthopnea, palpitations, paroxysmal nocturnal dyspnea or shortness of breath Dermatological: negative for rash Respiratory: negative for cough or wheezing Urologic: negative for hematuria Abdominal: negative for nausea, vomiting, diarrhea, bright red blood per rectum, melena, or hematemesis Neurologic: negative for visual changes, syncope, or dizziness All other systems reviewed and are otherwise negative except as noted above.   Physical Exam:  Blood pressure (!) 160/80, pulse 76, height 4\' 11"  (1.499 m), weight 102 lb 12.8 oz (  46.6 kg).  General appearance: alert, cooperative and no distress Neck: no carotid bruit and no JVD Lungs: clear to auscultation bilaterally Heart: irregularly irregular rhythm and regular rate Extremities: extremities normal, atraumatic, no cyanosis or edema Pulses: 2+ and symmetric Skin: Skin color, texture, turgor normal. No rashes or lesions Neurologic: Grossly normal  EKG not performed -- personally reviewed   ASSESSMENT AND PLAN:   1. Chronic Afib/Increased Palpitations: rate is currently controlled, However patient reports recent increase in heart rate and more frequent tachycardia palpitations. She denies dizziness, syncope/near syncope or falls. No CP or dyspnea. She is fully compliant with beta blocker therapy but is on low-dose Metoprolol, 12.5 mg twice a day. Her blood pressure is elevated at 160/80. Given her increased symptoms/heart rate and elevated blood pressure, will increase her Metroprolol to a y, 25 mg twice a day. She was also encouraged to stay well hydrated with fluids. Patient will monitor her blood pressure and heart rate closely. We will have her follow-up for nursing care visit in 3 weeks when she returns for her Coumadin clinic appointment. Continue Coumadin for anticoagulation.  2. HTN: elevated. Increase metoprolol to 25 mg BID.   Follow-Up w/ Dr. [name] in 6 months.   Temeca Somma Ladoris Gene, MHS [facility] 07/19/2016 3:32  PM

## 2016-07-24 ENCOUNTER — Encounter (HOSPITAL_BASED_OUTPATIENT_CLINIC_OR_DEPARTMENT_OTHER): Payer: Self-pay | Admitting: Emergency Medicine

## 2016-07-24 ENCOUNTER — Emergency Department (HOSPITAL_BASED_OUTPATIENT_CLINIC_OR_DEPARTMENT_OTHER)
Admission: EM | Admit: 2016-07-24 | Discharge: 2016-07-24 | Disposition: A | Discharge status: Home / Self Care | Payer: Medicare Other | Attending: Emergency Medicine | Admitting: Emergency Medicine

## 2016-07-24 DIAGNOSIS — Z79899 Other long term (current) drug therapy: Secondary | ICD-10-CM | POA: Diagnosis not present

## 2016-07-24 DIAGNOSIS — E079 Disorder of thyroid, unspecified: Secondary | ICD-10-CM | POA: Diagnosis not present

## 2016-07-24 DIAGNOSIS — R531 Weakness: Secondary | ICD-10-CM | POA: Diagnosis present

## 2016-07-24 DIAGNOSIS — I10 Essential (primary) hypertension: Secondary | ICD-10-CM | POA: Diagnosis not present

## 2016-07-24 DIAGNOSIS — R350 Frequency of micturition: Secondary | ICD-10-CM | POA: Diagnosis not present

## 2016-07-24 DIAGNOSIS — Z7901 Long term (current) use of anticoagulants: Secondary | ICD-10-CM | POA: Diagnosis not present

## 2016-07-24 DIAGNOSIS — E039 Hypothyroidism, unspecified: Secondary | ICD-10-CM | POA: Diagnosis not present

## 2016-07-24 LAB — CBC WITH DIFFERENTIAL/PLATELET
Basophils Absolute: 0 10*3/uL (ref 0.0–0.1)
Basophils Relative: 0 %
EOS ABS: 0 10*3/uL (ref 0.0–0.7)
EOS PCT: 0 %
HCT: 36.9 % (ref 36.0–46.0)
HEMOGLOBIN: 12.6 g/dL (ref 12.0–15.0)
LYMPHS ABS: 0.8 10*3/uL (ref 0.7–4.0)
Lymphocytes Relative: 8 %
MCH: 30.7 pg (ref 26.0–34.0)
MCHC: 34.1 g/dL (ref 30.0–36.0)
MCV: 90 fL (ref 78.0–100.0)
MONOS PCT: 5 %
Monocytes Absolute: 0.5 10*3/uL (ref 0.1–1.0)
NEUTROS PCT: 87 %
Neutro Abs: 8.6 10*3/uL — ABNORMAL HIGH (ref 1.7–7.7)
Platelets: 156 10*3/uL (ref 150–400)
RBC: 4.1 MIL/uL (ref 3.87–5.11)
RDW: 14.4 % (ref 11.5–15.5)
WBC: 10 10*3/uL (ref 4.0–10.5)

## 2016-07-24 LAB — COMPREHENSIVE METABOLIC PANEL
ALBUMIN: 3.7 g/dL (ref 3.5–5.0)
ALT: 16 U/L (ref 14–54)
AST: 26 U/L (ref 15–41)
Alkaline Phosphatase: 16 U/L — ABNORMAL LOW (ref 38–126)
Anion gap: 10 (ref 5–15)
BUN: 14 mg/dL (ref 6–20)
CHLORIDE: 100 mmol/L — AB (ref 101–111)
CO2: 28 mmol/L (ref 22–32)
Calcium: 8.9 mg/dL (ref 8.9–10.3)
Creatinine, Ser: 0.99 mg/dL (ref 0.44–1.00)
GFR calc Af Amer: 56 mL/min — ABNORMAL LOW (ref >60)
GFR calc non Af Amer: 49 mL/min — ABNORMAL LOW (ref >60)
GLUCOSE: 150 mg/dL — AB (ref 65–99)
Potassium: 3.6 mmol/L (ref 3.5–5.1)
SODIUM: 138 mmol/L (ref 135–145)
Total Bilirubin: 0.6 mg/dL (ref 0.3–1.2)
Total Protein: 6.8 g/dL (ref 6.5–8.1)

## 2016-07-24 LAB — URINALYSIS, ROUTINE W REFLEX MICROSCOPIC
Bilirubin Urine: NEGATIVE
GLUCOSE, UA: NEGATIVE mg/dL
HGB URINE DIPSTICK: NEGATIVE
Ketones, ur: NEGATIVE mg/dL
Leukocytes, UA: NEGATIVE
Nitrite: NEGATIVE
PROTEIN: NEGATIVE mg/dL
Specific Gravity, Urine: 1.009 (ref 1.005–1.030)
pH: 7.5 (ref 5.0–8.0)

## 2016-07-24 LAB — PROTIME-INR
INR: 2.83
Prothrombin Time: 30.3 seconds — ABNORMAL HIGH (ref 11.4–15.2)

## 2016-07-24 LAB — LIPASE, BLOOD: Lipase: 23 U/L (ref 11–51)

## 2016-07-24 NOTE — Discharge Instructions (Signed)
RETURN TO ER IF YOU HAVE ANY ABDOMINAL PAIN, VOMITING, FEVER, BLOODY STOOLS, OR WORSENING SYMPTOMS.

## 2016-07-24 NOTE — ED Triage Notes (Signed)
Patient states that she woke up this am with generalized weakness and frequency in urine

## 2016-07-24 NOTE — ED Provider Notes (Signed)
[facility] Provider Note   CSN: [id number] Arrival date & time: 07/24/16  1014     History   Chief Complaint Chief Complaint  Patient presents with  . Weakness    HPI SHAKETTA RILL is a 83 y.o. female.  83yo F w/ PMH including A fib, GERD, diverticulitis who p/w multiple complaints. She woke up in the night feeling cold. Since this morning, she has had a "jumping feeling" in her left lower abdomen that is not painful. No fevers, N/V, diarrhea, constipation, bloody or black stools, urinary symptoms, cough/cold symptoms, chest pain, shortness of breath. She was recently increased on her metoprolol dose, no other medication changes or new medicines.   She reported weakness and urinary frequency to triage nurse but did not report these symptoms to me.   The history is provided by the patient.  Weakness     Past Medical History:  Diagnosis Date  . Anemia   . Anxiety   . Arthritis    hips, knees  . Atrial fibrillation ([place])   . Constipation   . Cough   . Current use of long term anticoagulation   . Diarrhea   . Diverticulitis of colon 02/06/2015  . Diverticulosis   . Dysrhythmia    a-fib  . Gait difficulty   . GERD (gastroesophageal reflux disease)   . Headache(784.0) 06/22/2012  . History of mammogram 2009  . Hyperkalemia 06/28/2012  . Hyperlipidemia   . Hyperlipidemia   . Hypertension   . Hypertension   . IBS (irritable bowel syndrome)   . Incontinence   . Loss of weight 06/05/2015  . Medicare annual wellness visit, subsequent 02/05/2014  . Melena   . Mitral and aortic regurgitation   . Mitral valve prolapse    hx of  . Osteopenia   . Paroxysmal A-fib ([place])   . Personal history of colonic polyps   . Pneumonia   . Pneumonia    organism unspecified  . Preventative health care 03/18/2016  . Renal insufficiency 06/28/2012  . Renal lithiasis 06/26/2015  . Thyroid disease    hypo  . Tracheitis   . Unspecified adverse effect of other drug, medicinal  and biological substance(995.29)   . UTI (lower urinary tract infection)   . Vitamin B12 deficiency 03/16/2016  . Vitamin D deficiency     Patient Active Problem List   Diagnosis Date Noted  . Preventative health care 03/18/2016  . Vitamin B12 deficiency 03/16/2016  . Renal lithiasis 06/26/2015  . Loss of weight 06/05/2015  . Diverticulitis of colon 02/06/2015  . Right-sided thoracic back pain 11/15/2014  . Nipple discharge 06/21/2014  . Rib pain on right side 04/29/2014  . Medicare annual wellness visit, subsequent 02/05/2014  . Abdominal pain, chronic, right lower quadrant 02/03/2014  . Rectal bleeding 12/03/2013  . Prolapse of female pelvic organs 12/03/2013  . Leukocytes in urine 11/25/2013  . Abnormal chest x-ray 10/23/2013  . Pulmonary hypertension ([place]) 10/09/2013  . Tricuspid regurgitation 10/09/2013  . Chronic anticoagulation 10/09/2013  . CAP (community acquired pneumonia) 09/08/2013  . Hyperglycemia 08/24/2013  . Encounter for therapeutic drug monitoring 03/13/2013  . Renal insufficiency 06/28/2012  . Hyperkalemia 06/28/2012  . Headache(784.0) 06/22/2012  . Sinus bradycardia 09/20/2011  . Increased urinary frequency 02/13/2011  . Arthritis 11/13/2010  . Long term (current) use of anticoagulants 03/31/2010  . DIZZINESS 08/03/2009  . ACHALASIA 05/18/2009  . DYSPHAGIA UNSPECIFIED 04/11/2009  . Anemia 01/19/2009  . ESOPHAGEAL STRICTURE 01/19/2009  . ANXIETY 09/15/2008  .  MITRAL REGURGITATION, MILD 08/20/2008  . PAROXYSMAL ATRIAL FIBRILLATION 08/20/2008  . MITRAL VALVE PROLAPSE, HX OF 08/20/2008  . Vitamin D deficiency 01/15/2008  . IBS 02/03/2007  . Hypothyroidism 01/29/2007  . Hyperlipidemia, mixed 01/29/2007  . Essential hypertension 01/29/2007  . GERD 01/29/2007  . Disorder of bone and cartilage 01/29/2007  . COLONIC POLYPS, HX OF 01/29/2007    Past Surgical History:  Procedure Laterality Date  . BOTOX INJECTION N/A 09/03/2012   Procedure: BOTOX  INJECTION;  Surgeon: Inda Castle, MD;  Location: [facility];  Service: Endoscopy;  Laterality: N/A;  . BREAST BIOPSY Left   . BREAST LUMPECTOMY Right 08/18/2014   Procedure: RIGHT BREAST LUMPECTOMY;  Surgeon: Coralie Keens, MD;  Location: [facility];  Service: General;  Laterality: Right;  . CARDIAC ELECTROPHYSIOLOGY MAPPING AND ABLATION    . CATARACT EXTRACTION, BILATERAL    . ESOPHAGOGASTRODUODENOSCOPY N/A 09/03/2012   Procedure: ESOPHAGOGASTRODUODENOSCOPY (EGD);  Surgeon: Inda Castle, MD;  Location: [facility];  Service: Endoscopy;  Laterality: N/A;  . ESOPHAGOSCOPY W/ BOTOX INJECTION    . I&D EXTREMITY Right 11/06/2012   Procedure: IRRIGATION AND DEBRIDEMENT EXTREMITY Right Ring Finger;  Surgeon: Tennis Must, MD;  Location: [facility];  Service: Orthopedics;  Laterality: Right;  . PARTIAL HYSTERECTOMY     ovaries left in place    OB History    No data available       Home Medications    Prior to Admission medications   Medication Sig Start Date End Date Taking? Authorizing Provider  ALPRAZolam (XANAX) 0.25 MG tablet TAKE 1/2 TO 1 TABLET BY MOUTH TWICE A DAY AS NEEDED FOR ANXIETY 06/14/16   Mosie Lukes, MD  atorvastatin (LIPITOR) 10 MG tablet TAKE 1 TABLET (10 MG TOTAL) BY MOUTH DAILY AT 6 PM. 06/04/16   Mosie Lukes, MD  Cholecalciferol (VITAMIN D) 2000 UNITS CAPS Take 2,000 Units by mouth every other day. 02/20/12   Mosie Lukes, MD  Cyanocobalamin (VITAMIN B-12) 500 MCG SUBL Place 1 tablet (500 mcg total) under the tongue daily at 2 PM. 06/22/16   Mosie Lukes, MD  Lactobacillus-Inulin (PROBIOTIC DIGESTIVE SUPPORT PO) Take 1 capsule by mouth daily.    [provider]  metoprolol tartrate (LOPRESSOR) 25 MG tablet Take 1 tablet (25 mg total) by mouth 2 (two) times daily. 07/19/16   Dorothy Spark, MD  pantoprazole (PROTONIX) 40 MG tablet TAKE 1 TABLET (40 MG TOTAL) BY MOUTH DAILY. 09/15/15   Mosie Lukes, MD  ramipril (ALTACE) 10 MG  capsule TAKE 1 CAPSULE (10 MG TOTAL) BY MOUTH DAILY. 07/17/16   Mosie Lukes, MD  ranitidine (ZANTAC) 150 MG tablet Take 1 tablet (150 mg total) by mouth at bedtime. 03/16/16   Mosie Lukes, MD  warfarin (COUMADIN) 2.5 MG tablet TAKE AS DIRECTED BY ANTICOAGULATION CLINIC. A 30 DAY SUPPLY 07/16/16   Dorothy Spark, MD    Family History Family History  Problem Relation Age of Onset  . Diabetes Mother   . CVA Mother   . Stroke Mother   . COPD Father   . Rheum arthritis Father   . Allergies Daughter   . COPD Daughter   . Stroke Daughter   . COPD Daughter        previous smoker  . Stroke Maternal Grandfather   . Heart disease Maternal Aunt   . Heart disease Maternal Uncle   . Colon cancer Neg Hx   . Colon polyps Neg  Hx   . Esophageal cancer Neg Hx   . Gallbladder disease Neg Hx   . Kidney disease Neg Hx   . Heart attack Neg Hx   . Hypertension Neg Hx     Social History Social History  Substance Use Topics  . Smoking status: Never Smoker  . Smokeless tobacco: Never Used  . Alcohol use No     Allergies   Darifenacin hydrobromide; Sulfonamide derivatives; and Tramadol   Review of Systems Review of Systems  Neurological: Positive for weakness.  All other systems reviewed and are negative except that which was mentioned in HPI    Physical Exam Updated Vital Signs BP (!) 160/86 (BP Location: Right Arm)   Pulse 79   Temp 98.2 F (36.8 C) (Oral)   Resp 18   Ht 4\' 11"  (1.499 m)   Wt 46.7 kg (103 lb)   SpO2 96%   BMI 20.80 kg/m   Physical Exam  Constitutional: She is oriented to person, place, and time. She appears well-developed and well-nourished. No distress.  HENT:  Head: Normocephalic and atraumatic.  Mouth/Throat: Oropharynx is clear and moist.  Moist mucous membranes  Eyes: Pupils are equal, round, and reactive to light. Conjunctivae are normal.  Neck: Neck supple.  Cardiovascular: Normal rate, regular rhythm and normal heart sounds.   No murmur  heard. Pulmonary/Chest: Effort normal and breath sounds normal.  Abdominal: Soft. Bowel sounds are normal. She exhibits no distension. There is tenderness (mild LLQ to deep palpation). There is no rebound and no guarding.  Musculoskeletal: She exhibits no edema.  Neurological: She is alert and oriented to person, place, and time.  Fluent speech  Skin: Skin is warm and dry.  Psychiatric: She has a normal mood and affect. Judgment normal.  Nursing note and vitals reviewed.    ED Treatments / Results  Labs (all labs ordered are listed, but only abnormal results are displayed) Labs Reviewed - No data to display  EKG  EKG Interpretation None       Radiology No results found.  Procedures Procedures (including critical care time)  Medications Ordered in ED Medications - No data to display   Initial Impression / Assessment and Plan / ED Course  I have reviewed the triage vital signs and the nursing notes.  Pertinent labs that were available during my care of the patient were reviewed by me and considered in my medical decision making (see chart for details).    Pt w/ report of generalized weakness, urinary frequency and "jumping feeling" in abdomen. She was Well-appearing on exam with normal vital signs. She denied any abdominal pain. She had very mild left lower quadrant tenderness to deep palpation on initial exam but denied any abdominal pain at rest. Obtained above labs which shows normal UA, reassuring CMP, normal CBC. INR therapeutic at 2.8. On reexamination, she denied any complaints. Repeat abdominal exam showed no areas of focal tenderness. She denied any pain. Because of her reassuring exam and lab work, I do not feel she needs any further workup at this time but I have extensively reviewed return precautions regarding any abdominal pain, vomiting, fever, bloody stools, or other alarming symptoms. Patient voiced understanding and was discharged in satisfactory  condition.  Final Clinical Impressions(s) / ED Diagnoses   Final diagnoses:  Weakness    New Prescriptions New Prescriptions   No medications on file     Loisann Roach, Wenda Overland, MD 07/24/16 1357

## 2016-08-09 ENCOUNTER — Telehealth: Payer: Self-pay | Admitting: *Deleted

## 2016-08-09 ENCOUNTER — Other Ambulatory Visit: Payer: Self-pay | Admitting: *Deleted

## 2016-08-09 ENCOUNTER — Ambulatory Visit (INDEPENDENT_AMBULATORY_CARE_PROVIDER_SITE_OTHER): Payer: Medicare Other | Admitting: *Deleted

## 2016-08-09 VITALS — BP 168/80 | HR 74 | Ht 59.0 in | Wt 102.0 lb

## 2016-08-09 DIAGNOSIS — Z5181 Encounter for therapeutic drug level monitoring: Secondary | ICD-10-CM | POA: Diagnosis not present

## 2016-08-09 DIAGNOSIS — I10 Essential (primary) hypertension: Secondary | ICD-10-CM

## 2016-08-09 DIAGNOSIS — I4891 Unspecified atrial fibrillation: Secondary | ICD-10-CM | POA: Diagnosis not present

## 2016-08-09 LAB — POCT INR: INR: 2.8

## 2016-08-09 MED ORDER — METOPROLOL TARTRATE 25 MG PO TABS: 37.5000 mg | ORAL_TABLET | ORAL | 1 refills | Status: DC

## 2016-08-09 NOTE — Telephone Encounter (Signed)
Detailed message left for pt to continue with same medicines no changes and continue to monitor b/p per Dr Nelson./cy

## 2016-08-09 NOTE — Patient Instructions (Signed)
Per pt did not take am meds today. B/p readings Aug 1  134/65  Hr 78         3  131/61 hr 76         5 151/68  Hr 79       Pm 134/73 hr 77 Pt could not tolerate 50 mg qd  Decreased to 37.5 mg and doing okay Discussed with Dr [name] continue with meds no changes ./cy

## 2016-08-13 ENCOUNTER — Telehealth: Payer: Self-pay | Admitting: Family Medicine

## 2016-08-13 NOTE — Telephone Encounter (Signed)
Caller name:Bellow,Peggy Relation to pt: daughter  Call back number:[phone]   Reason for call:  Patient requesting referral to [facility] [phone]

## 2016-08-14 ENCOUNTER — Other Ambulatory Visit: Payer: Self-pay | Admitting: Family Medicine

## 2016-08-14 NOTE — Telephone Encounter (Signed)
Requesting:   alprazolam Contract  02/24/2016 UDS   Low risk on 08/23/2016 Last OV    06/22/2016-----future appt is on 10/01/2016 Last Refill    #90 no refills  On 06/14/2016  Please Advise

## 2016-08-16 ENCOUNTER — Other Ambulatory Visit: Payer: Self-pay | Admitting: Family Medicine

## 2016-08-16 DIAGNOSIS — H9190 Unspecified hearing loss, unspecified ear: Secondary | ICD-10-CM

## 2016-08-16 NOTE — Telephone Encounter (Signed)
Please advise    PC 

## 2016-08-16 NOTE — Telephone Encounter (Signed)
rx faxed to [facility].

## 2016-08-16 NOTE — Telephone Encounter (Signed)
Referral placedpri

## 2016-08-28 ENCOUNTER — Other Ambulatory Visit: Payer: Self-pay | Admitting: Family Medicine

## 2016-08-29 ENCOUNTER — Other Ambulatory Visit: Payer: Self-pay | Admitting: Family Medicine

## 2016-09-05 ENCOUNTER — Ambulatory Visit (INDEPENDENT_AMBULATORY_CARE_PROVIDER_SITE_OTHER): Payer: Medicare Other

## 2016-09-05 DIAGNOSIS — I4891 Unspecified atrial fibrillation: Secondary | ICD-10-CM | POA: Diagnosis not present

## 2016-09-05 DIAGNOSIS — Z5181 Encounter for therapeutic drug level monitoring: Secondary | ICD-10-CM

## 2016-09-05 LAB — POCT INR: INR: 2.6

## 2016-09-08 ENCOUNTER — Other Ambulatory Visit: Payer: Self-pay | Admitting: Family Medicine

## 2016-09-10 NOTE — Telephone Encounter (Signed)
On 8.9.18 #90+1 was faxed/thx dmf

## 2016-09-11 NOTE — Telephone Encounter (Signed)
Pt says that provider increased her Rx to twice a day for metoprolol tartrate  which has caused her to give out sooner.   Pt says that she will be out.   Please assist further.

## 2016-09-12 ENCOUNTER — Telehealth: Payer: Self-pay | Admitting: Medical

## 2016-09-12 MED ORDER — METOPROLOL TARTRATE 25 MG PO TABS: 37.5000 mg | ORAL_TABLET | ORAL | 0 refills | Status: DC

## 2016-09-12 NOTE — Telephone Encounter (Signed)
Patient is running out of her metoprolol. We investigated the last cardiology note and refill the medication accordingly. Only 30 tabs given and asked the nurse to have patient follow-up in 2 weeks with her PCP or with myself.
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
## 2016-09-12 NOTE — Telephone Encounter (Signed)
Pt's daughter [name] called in to follow up. She said that pt is completely out of medication. She said that she need it as soon as possible.    Due to pt being completely out I forwarded message to [facility] for assistance.   Please advise.

## 2016-09-19 ENCOUNTER — Telehealth: Payer: Self-pay | Admitting: Family Medicine

## 2016-09-20 ENCOUNTER — Other Ambulatory Visit: Payer: Self-pay

## 2016-09-21 ENCOUNTER — Other Ambulatory Visit: Payer: Self-pay | Admitting: Family Medicine

## 2016-09-21 NOTE — Telephone Encounter (Signed)
Requesting: Xanax Contract:yes UDS:low risk nxt scrn 08/23/16 Last OV:06/22/16 Next OV:10/01/16 Last Refill:08/14/16  #[id number]  Please advise

## 2016-09-24 ENCOUNTER — Other Ambulatory Visit (INDEPENDENT_AMBULATORY_CARE_PROVIDER_SITE_OTHER): Payer: Medicare Other

## 2016-09-24 DIAGNOSIS — I10 Essential (primary) hypertension: Secondary | ICD-10-CM

## 2016-09-24 DIAGNOSIS — R739 Hyperglycemia, unspecified: Secondary | ICD-10-CM | POA: Diagnosis not present

## 2016-09-24 DIAGNOSIS — E559 Vitamin D deficiency, unspecified: Secondary | ICD-10-CM

## 2016-09-24 DIAGNOSIS — E538 Deficiency of other specified B group vitamins: Secondary | ICD-10-CM | POA: Diagnosis not present

## 2016-09-24 DIAGNOSIS — E782 Mixed hyperlipidemia: Secondary | ICD-10-CM

## 2016-09-24 LAB — COMPREHENSIVE METABOLIC PANEL
ALBUMIN: 4.1 g/dL (ref 3.5–5.2)
ALK PHOS: 15 U/L — AB (ref 39–117)
ALT: 11 U/L (ref 0–35)
AST: 17 U/L (ref 0–37)
BILIRUBIN TOTAL: 0.5 mg/dL (ref 0.2–1.2)
BUN: 14 mg/dL (ref 6–23)
CALCIUM: 9.4 mg/dL (ref 8.4–10.5)
CHLORIDE: 103 meq/L (ref 96–112)
CO2: 31 mEq/L (ref 19–32)
CREATININE: 0.92 mg/dL (ref 0.40–1.20)
GFR: 60.86 mL/min (ref >60.00)
Glucose, Bld: 111 mg/dL — ABNORMAL HIGH (ref 70–99)
Potassium: 3.6 mEq/L (ref 3.5–5.1)
SODIUM: 143 meq/L (ref 135–145)
TOTAL PROTEIN: 7.3 g/dL (ref 6.0–8.3)

## 2016-09-24 LAB — CBC
HEMATOCRIT: 39.3 % (ref 36.0–46.0)
HEMOGLOBIN: 13.1 g/dL (ref 12.0–15.0)
MCHC: 33.5 g/dL (ref 30.0–36.0)
MCV: 93.1 fl (ref 78.0–100.0)
Platelets: 177 10*3/uL (ref 150.0–400.0)
RBC: 4.22 Mil/uL (ref 3.87–5.11)
RDW: 14.7 % (ref 11.5–15.5)
WBC: 4.8 10*3/uL (ref 4.0–10.5)

## 2016-09-24 LAB — LIPID PANEL
CHOLESTEROL: 125 mg/dL (ref 0–200)
HDL: 42.8 mg/dL (ref >39.00)
LDL CALC: 61 mg/dL (ref 0–99)
NonHDL: 81.71
TRIGLYCERIDES: 102 mg/dL (ref 0.0–149.0)
Total CHOL/HDL Ratio: 3
VLDL: 20.4 mg/dL (ref 0.0–40.0)

## 2016-09-24 LAB — TSH: TSH: 4.61 u[IU]/mL — AB (ref 0.35–4.50)

## 2016-09-24 LAB — VITAMIN D 25 HYDROXY (VIT D DEFICIENCY, FRACTURES): VITD: 44.87 ng/mL (ref 30.00–100.00)

## 2016-09-24 LAB — VITAMIN B12: VITAMIN B 12: 655 pg/mL (ref 211–911)

## 2016-09-24 LAB — HEMOGLOBIN A1C: Hgb A1c MFr Bld: 5.7 % (ref 4.6–6.5)

## 2016-09-24 NOTE — Telephone Encounter (Signed)
Pt tel  [phone]  Pt is wanting to know status of rx and also wanting to know the right amount of meds that she needs to take of this rx each day. Please advise.

## 2016-09-24 NOTE — Telephone Encounter (Signed)
rx sent to pharmacy.  PC

## 2016-10-01 ENCOUNTER — Ambulatory Visit (INDEPENDENT_AMBULATORY_CARE_PROVIDER_SITE_OTHER): Payer: Medicare Other | Admitting: Family Medicine

## 2016-10-01 ENCOUNTER — Encounter: Payer: Self-pay | Admitting: Family Medicine

## 2016-10-01 VITALS — BP 160/84 | HR 76 | Temp 97.9°F | Resp 18 | Wt 102.0 lb

## 2016-10-01 DIAGNOSIS — E559 Vitamin D deficiency, unspecified: Secondary | ICD-10-CM | POA: Diagnosis not present

## 2016-10-01 DIAGNOSIS — I10 Essential (primary) hypertension: Secondary | ICD-10-CM | POA: Diagnosis not present

## 2016-10-01 DIAGNOSIS — Z23 Encounter for immunization: Secondary | ICD-10-CM

## 2016-10-01 DIAGNOSIS — E782 Mixed hyperlipidemia: Secondary | ICD-10-CM | POA: Diagnosis not present

## 2016-10-01 DIAGNOSIS — R600 Localized edema: Secondary | ICD-10-CM

## 2016-10-01 HISTORY — DX: Localized edema: R60.0

## 2016-10-01 MED ORDER — HYDROCHLOROTHIAZIDE 12.5 MG PO CAPS: 12.5000 mg | ORAL_CAPSULE | Freq: Every day | ORAL | 3 refills | Status: DC

## 2016-10-01 NOTE — Patient Instructions (Signed)
Elevate feet. Minimize sodium and compression   Hypertension Hypertension, commonly called high blood pressure, is when the force of blood pumping through the arteries is too strong. The arteries are the blood vessels that carry blood from the heart throughout the body. Hypertension forces the heart to work harder to pump blood and may cause arteries to become narrow or stiff. Having untreated or uncontrolled hypertension can cause heart attacks, strokes, kidney disease, and other problems. A blood pressure reading consists of a higher number over a lower number. Ideally, your blood pressure should be below 120/80. The first ("top") number is called the systolic pressure. It is a measure of the pressure in your arteries as your heart beats. The second ("bottom") number is called the diastolic pressure. It is a measure of the pressure in your arteries as the heart relaxes. What are the causes? The cause of this condition is not known. What increases the risk? Some risk factors for high blood pressure are under your control. Others are not. Factors you can change  Smoking.  Having type 2 diabetes mellitus, high cholesterol, or both.  Not getting enough exercise or physical activity.  Being overweight.  Having too much fat, sugar, calories, or salt (sodium) in your diet.  Drinking too much alcohol. Factors that are difficult or impossible to change  Having chronic kidney disease.  Having a family history of high blood pressure.  Age. Risk increases with age.  Race. You may be at higher risk if you are African-American.  Gender. Men are at higher risk than women before age 81. After age 31, women are at higher risk than men.  Having obstructive sleep apnea.  Stress. What are the signs or symptoms? Extremely high blood pressure (hypertensive crisis) may cause:  Headache.  Anxiety.  Shortness of breath.  Nosebleed.  Nausea and vomiting.  Severe chest pain.  Jerky  movements you cannot control (seizures).  How is this diagnosed? This condition is diagnosed by measuring your blood pressure while you are seated, with your arm resting on a surface. The cuff of the blood pressure monitor will be placed directly against the skin of your upper arm at the level of your heart. It should be measured at least twice using the same arm. Certain conditions can cause a difference in blood pressure between your right and left arms. Certain factors can cause blood pressure readings to be lower or higher than normal (elevated) for a short period of time:  When your blood pressure is higher when you are in a health care provider's office than when you are at home, this is called white coat hypertension. Most people with this condition do not need medicines.  When your blood pressure is higher at home than when you are in a health care provider's office, this is called masked hypertension. Most people with this condition may need medicines to control blood pressure.  If you have a high blood pressure reading during one visit or you have normal blood pressure with other risk factors:  You may be asked to return on a different day to have your blood pressure checked again.  You may be asked to monitor your blood pressure at home for 1 week or longer.  If you are diagnosed with hypertension, you may have other blood or imaging tests to help your health care provider understand your overall risk for other conditions. How is this treated? This condition is treated by making healthy lifestyle changes, such as eating healthy foods,  exercising more, and reducing your alcohol intake. Your health care provider may prescribe medicine if lifestyle changes are not enough to get your blood pressure under control, and if:  Your systolic blood pressure is above 130.  Your diastolic blood pressure is above 80.  Your personal target blood pressure may vary depending on your medical  conditions, your age, and other factors. Follow these instructions at home: Eating and drinking  Eat a diet that is high in fiber and potassium, and low in sodium, added sugar, and fat. An example eating plan is called the DASH (Dietary Approaches to Stop Hypertension) diet. To eat this way: ? Eat plenty of fresh fruits and vegetables. Try to fill half of your plate at each meal with fruits and vegetables. ? Eat whole grains, such as whole wheat pasta, brown rice, or whole grain bread. Fill about one quarter of your plate with whole grains. ? Eat or drink low-fat dairy products, such as skim milk or low-fat yogurt. ? Avoid fatty cuts of meat, processed or cured meats, and poultry with skin. Fill about one quarter of your plate with lean proteins, such as fish, chicken without skin, beans, eggs, and tofu. ? Avoid premade and processed foods. These tend to be higher in sodium, added sugar, and fat.  Reduce your daily sodium intake. Most people with hypertension should eat less than 1,500 mg of sodium a day.  Limit alcohol intake to no more than 1 drink a day for nonpregnant women and 2 drinks a day for men. One drink equals 12 oz of beer, 5 oz of wine, or 1 oz of hard liquor. Lifestyle  Work with your health care provider to maintain a healthy body weight or to lose weight. Ask what an ideal weight is for you.  Get at least 30 minutes of exercise that causes your heart to beat faster (aerobic exercise) most days of the week. Activities may include walking, swimming, or biking.  Include exercise to strengthen your muscles (resistance exercise), such as pilates or lifting weights, as part of your weekly exercise routine. Try to do these types of exercises for 30 minutes at least 3 days a week.  Do not use any products that contain nicotine or tobacco, such as cigarettes and e-cigarettes. If you need help quitting, ask your health care provider.  Monitor your blood pressure at home as told by  your health care provider.  Keep all follow-up visits as told by your health care provider. This is important. Medicines  Take over-the-counter and prescription medicines only as told by your health care provider. Follow directions carefully. Blood pressure medicines must be taken as prescribed.  Do not skip doses of blood pressure medicine. Doing this puts you at risk for problems and can make the medicine less effective.  Ask your health care provider about side effects or reactions to medicines that you should watch for. Contact a health care provider if:  You think you are having a reaction to a medicine you are taking.  You have headaches that keep coming back (recurring).  You feel dizzy.  You have swelling in your ankles.  You have trouble with your vision. Get help right away if:  You develop a severe headache or confusion.  You have unusual weakness or numbness.  You feel faint.  You have severe pain in your chest or abdomen.  You vomit repeatedly.  You have trouble breathing. Summary  Hypertension is when the force of blood pumping through your arteries  is too strong. If this condition is not controlled, it may put you at risk for serious complications.  Your personal target blood pressure may vary depending on your medical conditions, your age, and other factors. For most people, a normal blood pressure is less than 120/80.  Hypertension is treated with lifestyle changes, medicines, or a combination of both. Lifestyle changes include weight loss, eating a healthy, low-sodium diet, exercising more, and limiting alcohol. This information is not intended to replace advice given to you by your health care provider. Make sure you discuss any questions you have with your health care provider. Document Released: 12/18/2004 Document Revised: 11/16/2015 Document Reviewed: 11/16/2015 Elsevier Interactive Patient Education  [employer].

## 2016-10-01 NOTE — Assessment & Plan Note (Signed)
Swelling with ankle swelling for a couple weeks. Elevate feet, minimize sodium compression hose.

## 2016-10-01 NOTE — Progress Notes (Signed)
Subjective:  I acted as a Education administrator for Dr. Charlett Blake. [place], [place]  Patient ID: [name] [name], female    DOB: 1924-11-19, 83 y.o.   MRN: [id number]  No chief complaint on file.   HPI  Patient is in today for a 3 month. Overall she is doing well. She notes some mild swelling in her ankles recently but it does not rise up her legs. No increased shortness of breath or chest pain. She has noted an occasional headache but this is her baseline. No recent febrile illness or hospitalizations. Denies CP/palp/SOB/HA/congestion/fevers/GI or GU c/o. Taking meds as prescribed   Patient Care Team: Mosie Lukes, MD as PCP - General (Family Medicine)   Past Medical History:  Diagnosis Date  . Anemia   . Anxiety   . Arthritis    hips, knees  . Atrial fibrillation ([place])   . Constipation   . Cough   . Current use of long term anticoagulation   . Diarrhea   . Diverticulitis of colon 02/06/2015  . Diverticulosis   . Dysrhythmia    a-fib  . Gait difficulty   . GERD (gastroesophageal reflux disease)   . Headache(784.0) 06/22/2012  . History of mammogram 2009  . Hyperkalemia 06/28/2012  . Hyperlipidemia   . Hyperlipidemia   . Hypertension   . Hypertension   . IBS (irritable bowel syndrome)   . Incontinence   . Loss of weight 06/05/2015  . Medicare annual wellness visit, subsequent 02/05/2014  . Melena   . Mitral and aortic regurgitation   . Mitral valve prolapse    hx of  . Osteopenia   . Paroxysmal A-fib ([place])   . Pedal edema 10/01/2016  . Personal history of colonic polyps   . Pneumonia   . Pneumonia    organism unspecified  . Preventative health care 03/18/2016  . Renal insufficiency 06/28/2012  . Renal lithiasis 06/26/2015  . Thyroid disease    hypo  . Tracheitis   . Unspecified adverse effect of other drug, medicinal and biological substance(995.29)   . UTI (lower urinary tract infection)   . Vitamin B12 deficiency 03/16/2016  . Vitamin D deficiency     Past Surgical History:    Procedure Laterality Date  . BOTOX INJECTION N/A 09/03/2012   Procedure: BOTOX INJECTION;  Surgeon: Inda Castle, MD;  Location: [facility];  Service: Endoscopy;  Laterality: N/A;  . BREAST BIOPSY Left   . BREAST LUMPECTOMY Right 08/18/2014   Procedure: RIGHT BREAST LUMPECTOMY;  Surgeon: Coralie Keens, MD;  Location: [facility];  Service: General;  Laterality: Right;  . CARDIAC ELECTROPHYSIOLOGY MAPPING AND ABLATION    . CATARACT EXTRACTION, BILATERAL    . ESOPHAGOGASTRODUODENOSCOPY N/A 09/03/2012   Procedure: ESOPHAGOGASTRODUODENOSCOPY (EGD);  Surgeon: Inda Castle, MD;  Location: [facility];  Service: Endoscopy;  Laterality: N/A;  . ESOPHAGOSCOPY W/ BOTOX INJECTION    . I&D EXTREMITY Right 11/06/2012   Procedure: IRRIGATION AND DEBRIDEMENT EXTREMITY Right Ring Finger;  Surgeon: Tennis Must, MD;  Location: [facility];  Service: Orthopedics;  Laterality: Right;  . PARTIAL HYSTERECTOMY     ovaries left in place    Family History  Problem Relation Age of Onset  . Diabetes Mother   . CVA Mother   . Stroke Mother   . COPD Father   . Rheum arthritis Father   . Allergies Daughter   . COPD Daughter   . Stroke Daughter   . COPD Daughter  previous smoker  . Stroke Maternal Grandfather   . Heart disease Maternal Aunt   . Heart disease Maternal Uncle   . Colon cancer Neg Hx   . Colon polyps Neg Hx   . Esophageal cancer Neg Hx   . Gallbladder disease Neg Hx   . Kidney disease Neg Hx   . Heart attack Neg Hx   . Hypertension Neg Hx     Social History   Social History  . Marital status: Widowed    Spouse name: N/A  . Number of children: 5  . Years of education: N/A   Occupational History  . retired Retired   Social History Main Topics  . Smoking status: Never Smoker  . Smokeless tobacco: Never Used  . Alcohol use No  . Drug use: No  . Sexual activity: Not on file     Comment: lives with Daughter, grandson and his family. no dietary restricitons.    Other Topics Concern  . Not on file   Social History Narrative  . No narrative on file    Outpatient Medications Prior to Visit  Medication Sig Dispense Refill  . ALPRAZolam (XANAX) 0.25 MG tablet TAKE 1/2-1 TABLET BY MOUTH TWICE DAILY AS NEEDED 90 tablet 0  . atorvastatin (LIPITOR) 10 MG tablet Take 1 tablet (10 mg total) by mouth daily at 6 PM. 30 tablet 2  . Cholecalciferol (VITAMIN D) 2000 UNITS CAPS Take 2,000 Units by mouth every other day.    . Cyanocobalamin (VITAMIN B-12) 500 MCG SUBL Place 1 tablet (500 mcg total) under the tongue daily at 2 PM. 150 tablet   . Lactobacillus-Inulin (PROBIOTIC DIGESTIVE SUPPORT PO) Take 1 capsule by mouth daily.    . pantoprazole (PROTONIX) 40 MG tablet TAKE 1 TABLET (40 MG TOTAL) BY MOUTH DAILY. 90 tablet 1  . ramipril (ALTACE) 10 MG capsule TAKE 1 CAPSULE (10 MG TOTAL) BY MOUTH DAILY. 90 capsule 0  . ranitidine (ZANTAC) 150 MG tablet Take 1 tablet (150 mg total) by mouth at bedtime. 30 tablet 5  . warfarin (COUMADIN) 2.5 MG tablet TAKE AS DIRECTED BY ANTICOAGULATION CLINIC. A 30 DAY SUPPLY 40 tablet 3  . metoprolol tartrate (LOPRESSOR) 25 MG tablet Take 1.5 tablets (37.5 mg total) by mouth 1 day or 1 dose. 45 tablet 0   No facility-administered medications prior to visit.     Allergies  Allergen Reactions  . Darifenacin Hydrobromide     REACTION: causes her dizziness  . Sulfonamide Derivatives   . Tramadol Other (See Comments)    Insomnia, anorexia    Review of Systems  Constitutional: Negative for fever and malaise/fatigue.  HENT: Negative for congestion.   Eyes: Negative for blurred vision.  Respiratory: Negative for shortness of breath.   Cardiovascular: Positive for leg swelling. Negative for chest pain and palpitations.  Gastrointestinal: Negative for abdominal pain, blood in stool and nausea.  Genitourinary: Negative for dysuria and frequency.  Musculoskeletal: Negative for falls.  Skin: Negative for rash.  Neurological:  Positive for headaches. Negative for dizziness, loss of consciousness and weakness.  Endo/Heme/Allergies: Negative for environmental allergies.  Psychiatric/Behavioral: Negative for depression. The patient is not nervous/anxious.        Objective:    Physical Exam  Cardiovascular:  Murmur heard.   BP (!) 160/84   Pulse 76   Temp 97.9 F (36.6 C) (Oral)   Resp 18   Wt 102 lb (46.3 kg)   SpO2 98%   BMI 20.60 kg/m  Wt Readings  from Last 3 Encounters:  10/01/16 102 lb (46.3 kg)  08/09/16 102 lb (46.3 kg)  07/24/16 103 lb (46.7 kg)   BP Readings from Last 3 Encounters:  10/03/16 (!) 160/84  08/09/16 (!) 168/80  07/24/16 136/74     Immunization History  Administered Date(s) Administered  . Influenza Whole 09/18/2007, 10/02/2010, 09/15/2012  . Influenza, High Dose Seasonal PF 10/01/2016  . Influenza,inj,Quad PF,6+ Mos 08/24/2013, 08/31/2014  . Pneumococcal Conjugate-13 08/31/2014  . Pneumococcal Polysaccharide-23 02/20/2006  . Tdap 10/30/2010    Health Maintenance  Topic Date Due  . Samul Dada  10/29/2020  . INFLUENZA VACCINE  Addressed  . DEXA SCAN  Completed  . PNA vac Low Risk Adult  Completed    Lab Results  Component Value Date   WBC 4.8 09/24/2016   HGB 13.1 09/24/2016   HCT 39.3 09/24/2016   PLT 177.0 09/24/2016   GLUCOSE 111 (H) 09/24/2016   CHOL 125 09/24/2016   TRIG 102.0 09/24/2016   HDL 42.80 09/24/2016   LDLDIRECT 72.1 01/19/2009   LDLCALC 61 09/24/2016   ALT 11 09/24/2016   AST 17 09/24/2016   NA 143 09/24/2016   K 3.6 09/24/2016   CL 103 09/24/2016   CREATININE 0.92 09/24/2016   BUN 14 09/24/2016   CO2 31 09/24/2016   TSH 4.61 (H) 09/24/2016   INR 2.6 09/05/2016   HGBA1C 5.7 09/24/2016    Lab Results  Component Value Date   TSH 4.61 (H) 09/24/2016   Lab Results  Component Value Date   WBC 4.8 09/24/2016   HGB 13.1 09/24/2016   HCT 39.3 09/24/2016   MCV 93.1 09/24/2016   PLT 177.0 09/24/2016   Lab Results  Component  Value Date   NA 143 09/24/2016   K 3.6 09/24/2016   CO2 31 09/24/2016   GLUCOSE 111 (H) 09/24/2016   BUN 14 09/24/2016   CREATININE 0.92 09/24/2016   BILITOT 0.5 09/24/2016   ALKPHOS 15 (L) 09/24/2016   AST 17 09/24/2016   ALT 11 09/24/2016   PROT 7.3 09/24/2016   ALBUMIN 4.1 09/24/2016   CALCIUM 9.4 09/24/2016   ANIONGAP 10 07/24/2016   GFR 60.86 09/24/2016   Lab Results  Component Value Date   CHOL 125 09/24/2016   Lab Results  Component Value Date   HDL 42.80 09/24/2016   Lab Results  Component Value Date   LDLCALC 61 09/24/2016   Lab Results  Component Value Date   TRIG 102.0 09/24/2016   Lab Results  Component Value Date   CHOLHDL 3 09/24/2016   Lab Results  Component Value Date   HGBA1C 5.7 09/24/2016         Assessment & Plan:   Problem List Items Addressed This Visit    Vitamin D deficiency    Levels WNL       Hyperlipidemia, mixed    Encouraged heart healthy diet, increase exercise, avoid trans fats, consider a krill oil cap daily      Relevant Medications   hydrochlorothiazide (MICROZIDE) 12.5 MG capsule   Essential hypertension    Not well controlled Encouraged heart healthy diet such as the DASH diet and exercise as tolerated. Add Hctiz 12.5 daily, continue Enalapril      Relevant Medications   hydrochlorothiazide (MICROZIDE) 12.5 MG capsule   Pedal edema    Swelling with ankle swelling for a couple weeks. Elevate feet, minimize sodium compression hose.        Other Visit Diagnoses    Needs flu shot    -  Primary   Relevant Orders   Flu vaccine HIGH DOSE PF (Fluzone High dose) (Completed)      I am having Ms. Tri start on hydrochlorothiazide. I am also having her maintain her Vitamin D, Lactobacillus-Inulin (PROBIOTIC DIGESTIVE SUPPORT PO), Vitamin B-12, warfarin, ramipril, atorvastatin, ranitidine, metoprolol tartrate, ALPRAZolam, and pantoprazole.  Meds ordered this encounter  Medications  . hydrochlorothiazide  (MICROZIDE) 12.5 MG capsule    Sig: Take 1 capsule (12.5 mg total) by mouth daily.    Dispense:  30 capsule    Refill:  3    CMA served as scribe during this visit. History, Physical and Plan performed by medical provider. Documentation and orders reviewed and attested to.  Penni Homans, MD

## 2016-10-01 NOTE — Assessment & Plan Note (Signed)
Not well controlled Encouraged heart healthy diet such as the DASH diet and exercise as tolerated. Add Hctiz 12.5 daily, continue Enalapril

## 2016-10-01 NOTE — Assessment & Plan Note (Signed)
Encouraged heart healthy diet, increase exercise, avoid trans fats, consider a krill oil cap daily 

## 2016-10-03 NOTE — Assessment & Plan Note (Signed)
Levels WNL

## 2016-10-10 ENCOUNTER — Ambulatory Visit (INDEPENDENT_AMBULATORY_CARE_PROVIDER_SITE_OTHER): Payer: Medicare Other | Admitting: *Deleted

## 2016-10-10 DIAGNOSIS — Z5181 Encounter for therapeutic drug level monitoring: Secondary | ICD-10-CM | POA: Diagnosis not present

## 2016-10-10 DIAGNOSIS — I4891 Unspecified atrial fibrillation: Secondary | ICD-10-CM

## 2016-10-10 LAB — POCT INR: INR: 2

## 2016-10-12 ENCOUNTER — Other Ambulatory Visit: Payer: Self-pay | Admitting: Family Medicine

## 2016-10-13 IMAGING — CR DG CHEST 2V
2 series · 2 of 2 positions shown · non-contrast
Comparison: Chest radiograph 04/29/2014

CLINICAL DATA: Back pain.  Acute on chronic pain.  No know injury.

EXAM:
CHEST  2 VIEW

[w chest pa]
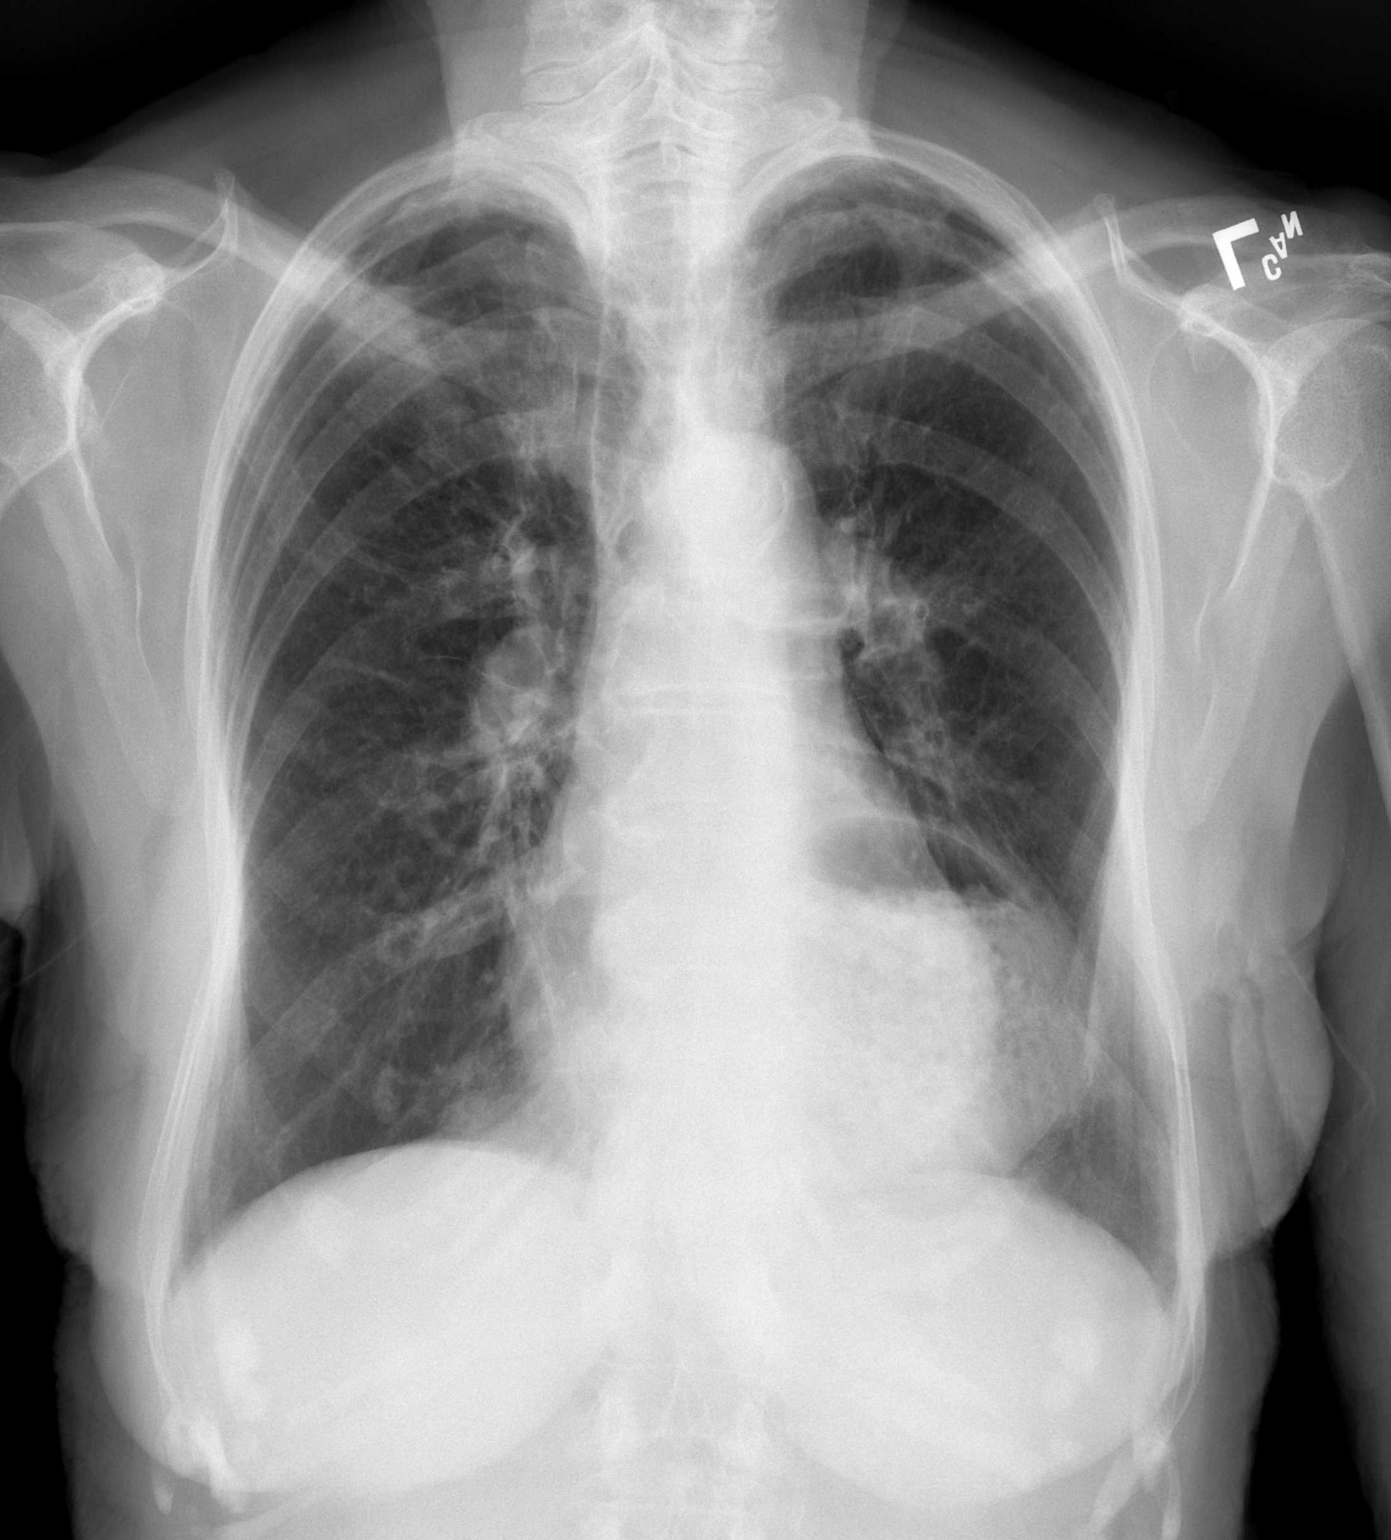

[w chest lat]
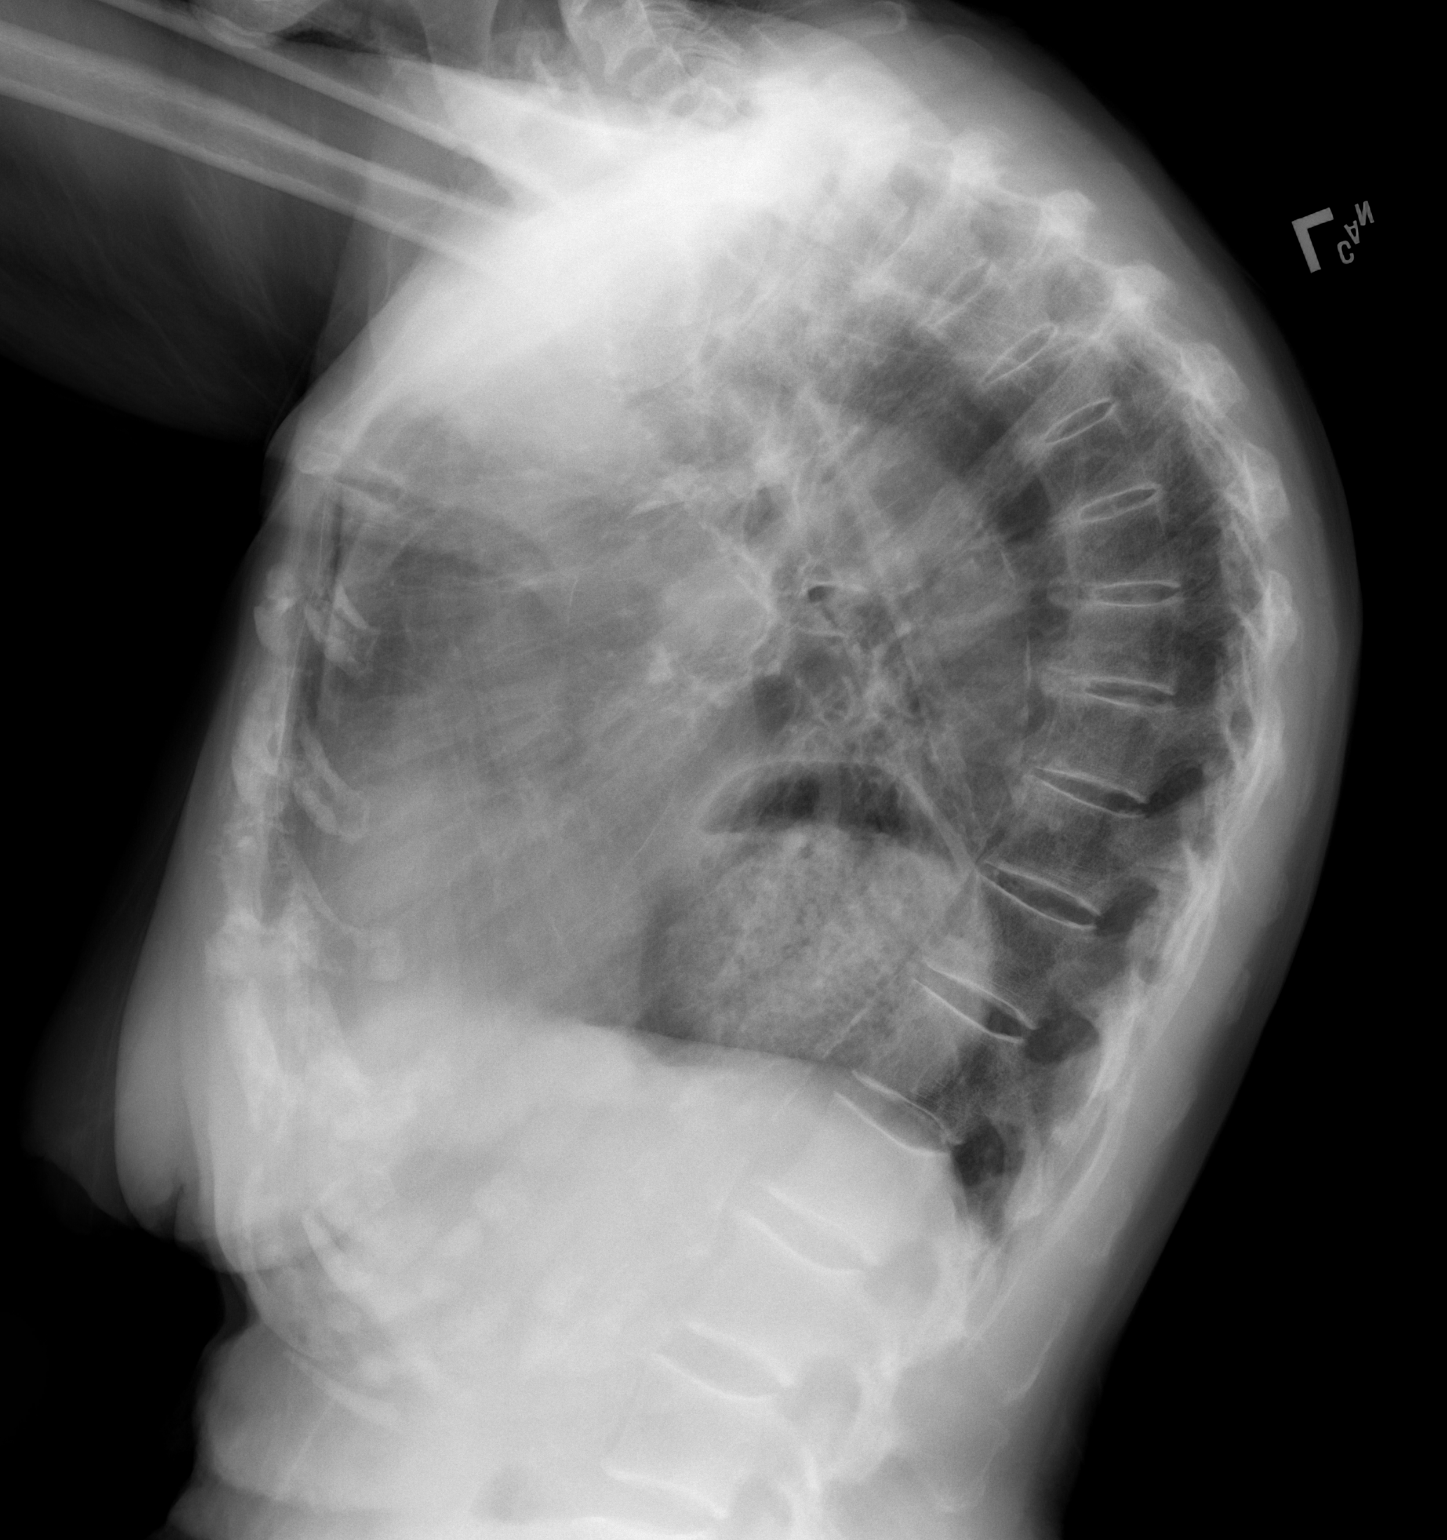

[2 of 2 positions shown; findings below may reference images not displayed]

FINDINGS: Heart is normal in size. Unchanged mediastinal contours with
prominent bilateral hila likely secondary prominent pulmonary
arteries. Atherosclerosis of the thoracic aorta. Moderate to large
retrocardiac hiatal hernia is again seen. No pleural effusion, focal
airspace disease, or pneumothorax. Mild biapical pleural parenchymal
scarring.

Exaggerated thoracic kyphosis with degenerative disc disease. No
compression deformity or evidence of acute osseous abnormality.
IMPRESSION: Stable exam, no explanation for back pain. Retrocardiac hiatal
hernia again seen.

## 2016-11-01 ENCOUNTER — Other Ambulatory Visit: Payer: Self-pay | Admitting: Emergency Medicine

## 2016-11-01 ENCOUNTER — Ambulatory Visit (INDEPENDENT_AMBULATORY_CARE_PROVIDER_SITE_OTHER): Payer: Medicare Other | Admitting: Family Medicine

## 2016-11-01 ENCOUNTER — Other Ambulatory Visit (INDEPENDENT_AMBULATORY_CARE_PROVIDER_SITE_OTHER): Payer: Medicare Other

## 2016-11-01 VITALS — BP 149/61 | HR 64

## 2016-11-01 DIAGNOSIS — I10 Essential (primary) hypertension: Secondary | ICD-10-CM | POA: Diagnosis not present

## 2016-11-01 LAB — COMPREHENSIVE METABOLIC PANEL
ALT: 11 U/L (ref 0–35)
AST: 16 U/L (ref 0–37)
Albumin: 3.9 g/dL (ref 3.5–5.2)
Alkaline Phosphatase: 13 U/L — ABNORMAL LOW (ref 39–117)
BUN: 13 mg/dL (ref 6–23)
CHLORIDE: 97 meq/L (ref 96–112)
CO2: 33 meq/L — AB (ref 19–32)
CREATININE: 0.99 mg/dL (ref 0.40–1.20)
Calcium: 9.3 mg/dL (ref 8.4–10.5)
GFR: 55.91 mL/min — ABNORMAL LOW (ref >60.00)
Glucose, Bld: 92 mg/dL (ref 70–99)
POTASSIUM: 3.8 meq/L (ref 3.5–5.1)
SODIUM: 137 meq/L (ref 135–145)
Total Bilirubin: 0.5 mg/dL (ref 0.2–1.2)
Total Protein: 6.9 g/dL (ref 6.0–8.3)

## 2016-11-01 LAB — TSH: TSH: 2.61 u[IU]/mL (ref 0.35–4.50)

## 2016-11-01 LAB — T4, FREE: FREE T4: 0.83 ng/dL (ref 0.60–1.60)

## 2016-11-01 NOTE — Progress Notes (Signed)
Noted. Agree with above.  [place], DO 11/01/16 12:27 PM

## 2016-11-01 NOTE — Patient Instructions (Signed)
Per Dr. Nani Ravens: Continue current medications & regimen. Check blood pressure two times a day, 1 minute apart, twice a week. Keep a log of readings. Return in 1 month for an office visit with PCP.

## 2016-11-01 NOTE — Progress Notes (Signed)
Pre visit review using our clinic review tool, if applicable. No additional management support is needed unless otherwise documented below in the visit note.  Patient presents in office for blood pressure check per OV note 10/01/16. She voices adherence to all medications. Patient denies chest pain, headaches, dizziness, lightheadedness & numbness/tingling in the limbs. RN obtained the following readings: BP 160/73 P 65 & BP 149/61 P 64.  Per Dr. Nani Ravens: Continue current medications & regimen. Check blood pressure two times a day, 1 minute apart, twice a week. Keep a log of readings. Return in 1 month for an office visit with PCP.  Patient was made aware of the provider's recommendations & verbalized understanding. Next appointment 12/06/16 at 11:00 AM.

## 2016-11-07 ENCOUNTER — Other Ambulatory Visit: Payer: Self-pay | Admitting: Cardiology

## 2016-11-21 ENCOUNTER — Ambulatory Visit (INDEPENDENT_AMBULATORY_CARE_PROVIDER_SITE_OTHER): Payer: Medicare Other | Admitting: *Deleted

## 2016-11-21 DIAGNOSIS — Z5181 Encounter for therapeutic drug level monitoring: Secondary | ICD-10-CM | POA: Diagnosis not present

## 2016-11-21 DIAGNOSIS — I4891 Unspecified atrial fibrillation: Secondary | ICD-10-CM | POA: Diagnosis not present

## 2016-11-21 LAB — POCT INR: INR: 2.4

## 2016-11-21 NOTE — Patient Instructions (Signed)
Continue taking 1 tablet daily except 1.5 tablets on Mondays. Recheck INR in 6 weeks. Call our office if you are placed on any new medications or if you have any concerns [phone].

## 2016-11-27 ENCOUNTER — Other Ambulatory Visit: Payer: Self-pay | Admitting: Family Medicine

## 2016-12-06 ENCOUNTER — Encounter: Payer: Self-pay | Admitting: Family Medicine

## 2016-12-06 ENCOUNTER — Ambulatory Visit: Payer: Medicare Other | Admitting: Family Medicine

## 2016-12-06 DIAGNOSIS — E039 Hypothyroidism, unspecified: Secondary | ICD-10-CM | POA: Diagnosis not present

## 2016-12-06 DIAGNOSIS — E559 Vitamin D deficiency, unspecified: Secondary | ICD-10-CM | POA: Diagnosis not present

## 2016-12-06 DIAGNOSIS — R739 Hyperglycemia, unspecified: Secondary | ICD-10-CM | POA: Diagnosis not present

## 2016-12-06 DIAGNOSIS — I10 Essential (primary) hypertension: Secondary | ICD-10-CM | POA: Diagnosis not present

## 2016-12-06 DIAGNOSIS — Z7901 Long term (current) use of anticoagulants: Secondary | ICD-10-CM

## 2016-12-06 NOTE — Assessment & Plan Note (Signed)
Daily supplements and check labs with next appt

## 2016-12-06 NOTE — Assessment & Plan Note (Signed)
TSH and free T4 have normalized no changes

## 2016-12-06 NOTE — Assessment & Plan Note (Signed)
Doing well and eating well, no concerns

## 2016-12-06 NOTE — Progress Notes (Signed)
Subjective:  I acted as a Education administrator for [employer]. Yancey Flemings, [place]   Patient ID: [name] [name], female    DOB: 10-30-1924, 83 y.o.   MRN: [id number]  No chief complaint on file.   HPI  Patient is in today for follow up visit and is accompanied by family. She is doing very well. No recent febrile illness or hospitalization. She is eating well and voiding well. Tolerating her meds well. She brings in a BP log from home which shows systolic BP of 010 to 932 over 60s and 70s. Denies CP/palp/SOB/HA/congestion/fevers/GI or GU c/o. Taking meds as prescribed  Patient Care Team: Mosie Lukes, MD as PCP - General (Family Medicine)   Past Medical History:  Diagnosis Date  . Anemia   . Anxiety   . Arthritis    hips, knees  . Atrial fibrillation ([place])   . Constipation   . Cough   . Current use of long term anticoagulation   . Diarrhea   . Diverticulitis of colon 02/06/2015  . Diverticulosis   . Dysrhythmia    a-fib  . Gait difficulty   . GERD (gastroesophageal reflux disease)   . Headache(784.0) 06/22/2012  . History of mammogram 2009  . Hyperkalemia 06/28/2012  . Hyperlipidemia   . Hyperlipidemia   . Hypertension   . Hypertension   . IBS (irritable bowel syndrome)   . Incontinence   . Loss of weight 06/05/2015  . Medicare annual wellness visit, subsequent 02/05/2014  . Melena   . Mitral and aortic regurgitation   . Mitral valve prolapse    hx of  . Osteopenia   . Paroxysmal A-fib ([place])   . Pedal edema 10/01/2016  . Personal history of colonic polyps   . Pneumonia   . Pneumonia    organism unspecified  . Preventative health care 03/18/2016  . Renal insufficiency 06/28/2012  . Renal lithiasis 06/26/2015  . Thyroid disease    hypo  . Tracheitis   . Unspecified adverse effect of other drug, medicinal and biological substance(995.29)   . UTI (lower urinary tract infection)   . Vitamin B12 deficiency 03/16/2016  . Vitamin D deficiency     Past Surgical History:  Procedure  Laterality Date  . BOTOX INJECTION N/A 09/03/2012   Procedure: BOTOX INJECTION;  Surgeon: Inda Castle, MD;  Location: [facility];  Service: Endoscopy;  Laterality: N/A;  . BREAST BIOPSY Left   . BREAST LUMPECTOMY Right 08/18/2014   Procedure: RIGHT BREAST LUMPECTOMY;  Surgeon: Coralie Keens, MD;  Location: [facility];  Service: General;  Laterality: Right;  . CARDIAC ELECTROPHYSIOLOGY MAPPING AND ABLATION    . CATARACT EXTRACTION, BILATERAL    . ESOPHAGOGASTRODUODENOSCOPY N/A 09/03/2012   Procedure: ESOPHAGOGASTRODUODENOSCOPY (EGD);  Surgeon: Inda Castle, MD;  Location: [facility];  Service: Endoscopy;  Laterality: N/A;  . ESOPHAGOSCOPY W/ BOTOX INJECTION    . I&D EXTREMITY Right 11/06/2012   Procedure: IRRIGATION AND DEBRIDEMENT EXTREMITY Right Ring Finger;  Surgeon: Tennis Must, MD;  Location: [facility];  Service: Orthopedics;  Laterality: Right;  . PARTIAL HYSTERECTOMY     ovaries left in place    Family History  Problem Relation Age of Onset  . Diabetes Mother   . CVA Mother   . Stroke Mother   . COPD Father   . Rheum arthritis Father   . Allergies Daughter   . COPD Daughter   . Stroke Daughter   . COPD Daughter  previous smoker  . Stroke Maternal Grandfather   . Heart disease Maternal Aunt   . Heart disease Maternal Uncle   . Colon cancer Neg Hx   . Colon polyps Neg Hx   . Esophageal cancer Neg Hx   . Gallbladder disease Neg Hx   . Kidney disease Neg Hx   . Heart attack Neg Hx   . Hypertension Neg Hx     Social History   Socioeconomic History  . Marital status: Widowed    Spouse name: Not on file  . Number of children: 5  . Years of education: Not on file  . Highest education level: Not on file  Social Needs  . Financial resource strain: Not on file  . Food insecurity - worry: Not on file  . Food insecurity - inability: Not on file  . Transportation needs - medical: Not on file  . Transportation needs - non-medical: Not on file    Occupational History  . Occupation: retired    Fish farm manager: RETIRED  Tobacco Use  . Smoking status: Never Smoker  . Smokeless tobacco: Never Used  Substance and Sexual Activity  . Alcohol use: No  . Drug use: No  . Sexual activity: Not on file    Comment: lives with Daughter, grandson and his family. no dietary restricitons.  Other Topics Concern  . Not on file  Social History Narrative  . Not on file    Outpatient Medications Prior to Visit  Medication Sig Dispense Refill  . ALPRAZolam (XANAX) 0.25 MG tablet TAKE 1/2-1 TABLET BY MOUTH TWICE DAILY AS NEEDED 90 tablet 0  . atorvastatin (LIPITOR) 10 MG tablet TAKE 1 TABLET BY MOUTH DAILY AT 6 PM. 30 tablet 2  . Cholecalciferol (VITAMIN D) 2000 UNITS CAPS Take 2,000 Units by mouth every other day.    . Cyanocobalamin (VITAMIN B-12) 500 MCG SUBL Place 1 tablet (500 mcg total) under the tongue daily at 2 PM. 150 tablet   . hydrochlorothiazide (MICROZIDE) 12.5 MG capsule Take 1 capsule (12.5 mg total) by mouth daily. 30 capsule 3  . Lactobacillus-Inulin (PROBIOTIC DIGESTIVE SUPPORT PO) Take 1 capsule by mouth daily.    . metoprolol tartrate (LOPRESSOR) 25 MG tablet Take 1.5 tablets (37.5 mg total) by mouth 1 day or 1 dose. (Patient taking differently: Take 25 mg by mouth 1 day or 1 dose. ) 45 tablet 0  . pantoprazole (PROTONIX) 40 MG tablet TAKE 1 TABLET (40 MG TOTAL) BY MOUTH DAILY. 90 tablet 1  . ramipril (ALTACE) 10 MG capsule TAKE 1 CAPSULE (10 MG TOTAL) BY MOUTH DAILY. 90 capsule 0  . ranitidine (ZANTAC) 150 MG tablet Take 1 tablet (150 mg total) by mouth at bedtime. 30 tablet 5  . warfarin (COUMADIN) 2.5 MG tablet TAKE AS DIRECTED BY ANTICOAGULATION CLINIC. A 30 DAY SUPPLY 40 tablet 3   No facility-administered medications prior to visit.     Allergies  Allergen Reactions  . Darifenacin Hydrobromide     REACTION: causes her dizziness  . Sulfonamide Derivatives   . Tramadol Other (See Comments)    Insomnia, anorexia    Review  of Systems  Constitutional: Negative for fever and malaise/fatigue.  HENT: Negative for congestion.   Eyes: Negative for blurred vision.  Respiratory: Negative for shortness of breath.   Cardiovascular: Negative for chest pain, palpitations and leg swelling.  Gastrointestinal: Negative for abdominal pain, blood in stool and nausea.  Genitourinary: Negative for dysuria and frequency.  Musculoskeletal: Negative for falls.  Skin:  Negative for rash.  Neurological: Negative for dizziness, loss of consciousness and headaches.  Endo/Heme/Allergies: Negative for environmental allergies.  Psychiatric/Behavioral: Negative for depression. The patient is not nervous/anxious.        Objective:    Physical Exam  Constitutional: She is oriented to person, place, and time. She appears well-developed and well-nourished. No distress.  HENT:  Head: Normocephalic and atraumatic.  Nose: Nose normal.  Eyes: Right eye exhibits no discharge. Left eye exhibits no discharge.  Neck: Normal range of motion. Neck supple.  Cardiovascular: Normal rate.  No murmur heard. Pulmonary/Chest: Effort normal and breath sounds normal.  Abdominal: Soft. Bowel sounds are normal. There is no tenderness.  Musculoskeletal: She exhibits no edema.  Neurological: She is alert and oriented to person, place, and time.  Skin: Skin is warm and dry.  Psychiatric: She has a normal mood and affect.  Nursing note and vitals reviewed.   BP (!) 158/77 (BP Location: Left Arm, Patient Position: Sitting, Cuff Size: Normal)   Pulse 80   Temp 98 F (36.7 C) (Oral)   Resp 16   Ht 4' 11.06" (1.5 m)   Wt 102 lb 6.4 oz (46.4 kg)   SpO2 97%   BMI 20.64 kg/m  Wt Readings from Last 3 Encounters:  12/06/16 102 lb 6.4 oz (46.4 kg)  10/01/16 102 lb (46.3 kg)  08/09/16 102 lb (46.3 kg)   BP Readings from Last 3 Encounters:  12/06/16 (!) 158/77  11/01/16 (!) 149/61  10/03/16 (!) 160/84     Immunization History  Administered Date(s)  Administered  . Influenza Whole 09/18/2007, 10/02/2010, 09/15/2012  . Influenza, High Dose Seasonal PF 10/01/2016  . Influenza,inj,Quad PF,6+ Mos 08/24/2013, 08/31/2014  . Pneumococcal Conjugate-13 08/31/2014  . Pneumococcal Polysaccharide-23 02/20/2006  . Tdap 10/30/2010    Health Maintenance  Topic Date Due  . Samul Dada  10/29/2020  . INFLUENZA VACCINE  Completed  . DEXA SCAN  Completed  . PNA vac Low Risk Adult  Completed    Lab Results  Component Value Date   WBC 4.8 09/24/2016   HGB 13.1 09/24/2016   HCT 39.3 09/24/2016   PLT 177.0 09/24/2016   GLUCOSE 92 11/01/2016   CHOL 125 09/24/2016   TRIG 102.0 09/24/2016   HDL 42.80 09/24/2016   LDLDIRECT 72.1 01/19/2009   LDLCALC 61 09/24/2016   ALT 11 11/01/2016   AST 16 11/01/2016   NA 137 11/01/2016   K 3.8 11/01/2016   CL 97 11/01/2016   CREATININE 0.99 11/01/2016   BUN 13 11/01/2016   CO2 33 (H) 11/01/2016   TSH 2.61 11/01/2016   INR 2.4 11/21/2016   HGBA1C 5.7 09/24/2016    Lab Results  Component Value Date   TSH 2.61 11/01/2016   Lab Results  Component Value Date   WBC 4.8 09/24/2016   HGB 13.1 09/24/2016   HCT 39.3 09/24/2016   MCV 93.1 09/24/2016   PLT 177.0 09/24/2016   Lab Results  Component Value Date   NA 137 11/01/2016   K 3.8 11/01/2016   CO2 33 (H) 11/01/2016   GLUCOSE 92 11/01/2016   BUN 13 11/01/2016   CREATININE 0.99 11/01/2016   BILITOT 0.5 11/01/2016   ALKPHOS 13 (L) 11/01/2016   AST 16 11/01/2016   ALT 11 11/01/2016   PROT 6.9 11/01/2016   ALBUMIN 3.9 11/01/2016   CALCIUM 9.3 11/01/2016   ANIONGAP 10 07/24/2016   GFR 55.91 (L) 11/01/2016   Lab Results  Component Value Date   CHOL  125 09/24/2016   Lab Results  Component Value Date   HDL 42.80 09/24/2016   Lab Results  Component Value Date   LDLCALC 61 09/24/2016   Lab Results  Component Value Date   TRIG 102.0 09/24/2016   Lab Results  Component Value Date   CHOLHDL 3 09/24/2016   Lab Results  Component  Value Date   HGBA1C 5.7 09/24/2016         Assessment & Plan:   Problem List Items Addressed This Visit    Hypothyroidism    TSH and free T4 have normalized no changes      Vitamin D deficiency    Daily supplements and check labs with next appt      Essential hypertension    Well controlled, no changes to meds. Encouraged heart healthy diet such as the DASH diet and exercise as tolerated.       Hyperglycemia    Doing well and eating well, no concerns      Chronic anticoagulation    Tolerating coumadin follows with coumadin clinic         I am having [name] [name] maintain her Vitamin D, Lactobacillus-Inulin (PROBIOTIC DIGESTIVE SUPPORT PO), Vitamin B-12, ranitidine, metoprolol tartrate, ALPRAZolam, pantoprazole, hydrochlorothiazide, ramipril, warfarin, and atorvastatin.  No orders of the defined types were placed in this encounter.   CMA served as Education administrator during this visit. History, Physical and Plan performed by medical provider. Documentation and orders reviewed and attested to.  Penni Homans, MD

## 2016-12-06 NOTE — Patient Instructions (Addendum)
Astelin is the nose medicine for a leaky nose Hypertension Hypertension is another name for high blood pressure. High blood pressure forces your heart to work harder to pump blood. This can cause problems over time. There are two numbers in a blood pressure reading. There is a top number (systolic) over a bottom number (diastolic). It is best to have a blood pressure below 120/80. Healthy choices can help lower your blood pressure. You may need medicine to help lower your blood pressure if:  Your blood pressure cannot be lowered with healthy choices.  Your blood pressure is higher than 130/80.  Follow these instructions at home: Eating and drinking  If directed, follow the DASH eating plan. This diet includes: ? Filling half of your plate at each meal with fruits and vegetables. ? Filling one quarter of your plate at each meal with whole grains. Whole grains include whole wheat pasta, brown rice, and whole grain bread. ? Eating or drinking low-fat dairy products, such as skim milk or low-fat yogurt. ? Filling one quarter of your plate at each meal with low-fat (lean) proteins. Low-fat proteins include fish, skinless chicken, eggs, beans, and tofu. ? Avoiding fatty meat, cured and processed meat, or chicken with skin. ? Avoiding premade or processed food.  Eat less than 1,500 mg of salt (sodium) a day.  Limit alcohol use to no more than 1 drink a day for nonpregnant women and 2 drinks a day for men. One drink equals 12 oz of beer, 5 oz of wine, or 1 oz of hard liquor. Lifestyle  Work with your doctor to stay at a healthy weight or to lose weight. Ask your doctor what the best weight is for you.  Get at least 30 minutes of exercise that causes your heart to beat faster (aerobic exercise) most days of the week. This may include walking, swimming, or biking.  Get at least 30 minutes of exercise that strengthens your muscles (resistance exercise) at least 3 days a week. This may include  lifting weights or pilates.  Do not use any products that contain nicotine or tobacco. This includes cigarettes and e-cigarettes. If you need help quitting, ask your doctor.  Check your blood pressure at home as told by your doctor.  Keep all follow-up visits as told by your doctor. This is important. Medicines  Take over-the-counter and prescription medicines only as told by your doctor. Follow directions carefully.  Do not skip doses of blood pressure medicine. The medicine does not work as well if you skip doses. Skipping doses also puts you at risk for problems.  Ask your doctor about side effects or reactions to medicines that you should watch for. Contact a doctor if:  You think you are having a reaction to the medicine you are taking.  You have headaches that keep coming back (recurring).  You feel dizzy.  You have swelling in your ankles.  You have trouble with your vision. Get help right away if:  You get a very bad headache.  You start to feel confused.  You feel weak or numb.  You feel faint.  You get very bad pain in your: ? Chest. ? Belly (abdomen).  You throw up (vomit) more than once.  You have trouble breathing. Summary  Hypertension is another name for high blood pressure.  Making healthy choices can help lower blood pressure. If your blood pressure cannot be controlled with healthy choices, you may need to take medicine. This information is not intended to  replace advice given to you by your health care provider. Make sure you discuss any questions you have with your health care provider. Document Released: 06/06/2007 Document Revised: 11/16/2015 Document Reviewed: 11/16/2015 Elsevier Interactive Patient Education  [employer].

## 2016-12-06 NOTE — Assessment & Plan Note (Signed)
Tolerating coumadin follows with coumadin clinic

## 2016-12-06 NOTE — Assessment & Plan Note (Signed)
Well controlled, no changes to meds. Encouraged heart healthy diet such as the DASH diet and exercise as tolerated.  °

## 2016-12-09 ENCOUNTER — Other Ambulatory Visit: Payer: Self-pay | Admitting: Cardiology

## 2016-12-10 ENCOUNTER — Encounter (HOSPITAL_COMMUNITY): Payer: Self-pay

## 2016-12-10 ENCOUNTER — Observation Stay (HOSPITAL_COMMUNITY)
Admission: EM | Admit: 2016-12-10 | Discharge: 2016-12-11 | Disposition: A | Discharge status: Home / Self Care | Payer: Medicare Other | Attending: Internal Medicine | Admitting: Internal Medicine

## 2016-12-10 ENCOUNTER — Emergency Department (HOSPITAL_COMMUNITY): Payer: Medicare Other

## 2016-12-10 ENCOUNTER — Other Ambulatory Visit: Payer: Self-pay

## 2016-12-10 ENCOUNTER — Observation Stay (HOSPITAL_BASED_OUTPATIENT_CLINIC_OR_DEPARTMENT_OTHER): Payer: Medicare Other

## 2016-12-10 DIAGNOSIS — F419 Anxiety disorder, unspecified: Secondary | ICD-10-CM | POA: Diagnosis not present

## 2016-12-10 DIAGNOSIS — E876 Hypokalemia: Secondary | ICD-10-CM | POA: Diagnosis not present

## 2016-12-10 DIAGNOSIS — N1831 Chronic kidney disease, stage 3a: Secondary | ICD-10-CM

## 2016-12-10 DIAGNOSIS — I129 Hypertensive chronic kidney disease with stage 1 through stage 4 chronic kidney disease, or unspecified chronic kidney disease: Secondary | ICD-10-CM | POA: Diagnosis not present

## 2016-12-10 DIAGNOSIS — I480 Paroxysmal atrial fibrillation: Secondary | ICD-10-CM | POA: Insufficient documentation

## 2016-12-10 DIAGNOSIS — I080 Rheumatic disorders of both mitral and aortic valves: Secondary | ICD-10-CM | POA: Insufficient documentation

## 2016-12-10 DIAGNOSIS — N183 Chronic kidney disease, stage 3 (moderate): Secondary | ICD-10-CM | POA: Insufficient documentation

## 2016-12-10 DIAGNOSIS — F411 Generalized anxiety disorder: Secondary | ICD-10-CM | POA: Diagnosis present

## 2016-12-10 DIAGNOSIS — R0789 Other chest pain: Secondary | ICD-10-CM | POA: Diagnosis not present

## 2016-12-10 DIAGNOSIS — Z7901 Long term (current) use of anticoagulants: Secondary | ICD-10-CM | POA: Diagnosis not present

## 2016-12-10 DIAGNOSIS — I361 Nonrheumatic tricuspid (valve) insufficiency: Secondary | ICD-10-CM | POA: Diagnosis not present

## 2016-12-10 DIAGNOSIS — E039 Hypothyroidism, unspecified: Secondary | ICD-10-CM | POA: Diagnosis present

## 2016-12-10 DIAGNOSIS — N289 Disorder of kidney and ureter, unspecified: Secondary | ICD-10-CM

## 2016-12-10 DIAGNOSIS — Z79899 Other long term (current) drug therapy: Secondary | ICD-10-CM | POA: Insufficient documentation

## 2016-12-10 DIAGNOSIS — K222 Esophageal obstruction: Secondary | ICD-10-CM

## 2016-12-10 DIAGNOSIS — Z8719 Personal history of other diseases of the digestive system: Secondary | ICD-10-CM | POA: Diagnosis not present

## 2016-12-10 DIAGNOSIS — E782 Mixed hyperlipidemia: Secondary | ICD-10-CM | POA: Diagnosis not present

## 2016-12-10 DIAGNOSIS — R079 Chest pain, unspecified: Secondary | ICD-10-CM | POA: Diagnosis present

## 2016-12-10 DIAGNOSIS — I10 Essential (primary) hypertension: Secondary | ICD-10-CM | POA: Diagnosis present

## 2016-12-10 DIAGNOSIS — K219 Gastro-esophageal reflux disease without esophagitis: Secondary | ICD-10-CM | POA: Diagnosis not present

## 2016-12-10 DIAGNOSIS — E559 Vitamin D deficiency, unspecified: Secondary | ICD-10-CM | POA: Diagnosis present

## 2016-12-10 DIAGNOSIS — D649 Anemia, unspecified: Secondary | ICD-10-CM | POA: Diagnosis present

## 2016-12-10 LAB — TROPONIN I
Troponin I: <0.03 ng/mL (ref <0.03)
Troponin I: <0.03 ng/mL (ref <0.03)
Troponin I: <0.03 ng/mL (ref <0.03)

## 2016-12-10 LAB — COMPREHENSIVE METABOLIC PANEL
ALT: 14 U/L (ref 14–54)
ANION GAP: 9 (ref 5–15)
AST: 23 U/L (ref 15–41)
Albumin: 3.6 g/dL (ref 3.5–5.0)
Alkaline Phosphatase: 16 U/L — ABNORMAL LOW (ref 38–126)
BILIRUBIN TOTAL: 0.8 mg/dL (ref 0.3–1.2)
BUN: 15 mg/dL (ref 6–20)
CHLORIDE: 103 mmol/L (ref 101–111)
CO2: 28 mmol/L (ref 22–32)
Calcium: 8.8 mg/dL — ABNORMAL LOW (ref 8.9–10.3)
Creatinine, Ser: 1.09 mg/dL — ABNORMAL HIGH (ref 0.44–1.00)
GFR, EST AFRICAN AMERICAN: 50 mL/min — AB (ref >60)
GFR, EST NON AFRICAN AMERICAN: 43 mL/min — AB (ref >60)
Glucose, Bld: 122 mg/dL — ABNORMAL HIGH (ref 65–99)
POTASSIUM: 3.3 mmol/L — AB (ref 3.5–5.1)
Sodium: 140 mmol/L (ref 135–145)
TOTAL PROTEIN: 6.7 g/dL (ref 6.5–8.1)

## 2016-12-10 LAB — LIPID PANEL
CHOL/HDL RATIO: 2.8 ratio
CHOLESTEROL: 106 mg/dL (ref 0–200)
HDL: 38 mg/dL — ABNORMAL LOW (ref >40)
LDL Cholesterol: 55 mg/dL (ref 0–99)
TRIGLYCERIDES: 64 mg/dL (ref <150)
VLDL: 13 mg/dL (ref 0–40)

## 2016-12-10 LAB — CBC WITH DIFFERENTIAL/PLATELET
BASOS ABS: 0 10*3/uL (ref 0.0–0.1)
Basophils Relative: 0 %
EOS PCT: 0 %
Eosinophils Absolute: 0 10*3/uL (ref 0.0–0.7)
HEMATOCRIT: 36 % (ref 36.0–46.0)
HEMOGLOBIN: 12.3 g/dL (ref 12.0–15.0)
LYMPHS ABS: 1 10*3/uL (ref 0.7–4.0)
LYMPHS PCT: 14 %
MCH: 30.9 pg (ref 26.0–34.0)
MCHC: 34.2 g/dL (ref 30.0–36.0)
MCV: 90.5 fL (ref 78.0–100.0)
Monocytes Absolute: 0.4 10*3/uL (ref 0.1–1.0)
Monocytes Relative: 6 %
NEUTROS ABS: 5.6 10*3/uL (ref 1.7–7.7)
NEUTROS PCT: 80 %
PLATELETS: 159 10*3/uL (ref 150–400)
RBC: 3.98 MIL/uL (ref 3.87–5.11)
RDW: 14.3 % (ref 11.5–15.5)
WBC: 7 10*3/uL (ref 4.0–10.5)

## 2016-12-10 LAB — PROTIME-INR
INR: 3
Prothrombin Time: 30.9 seconds — ABNORMAL HIGH (ref 11.4–15.2)

## 2016-12-10 LAB — ECHOCARDIOGRAM COMPLETE
Height: 59 in
Weight: 1632 oz

## 2016-12-10 MED ORDER — WARFARIN SODIUM 2.5 MG PO TABS
3.7500 mg | ORAL_TABLET | Freq: Once | ORAL | Status: AC
  Administered 2016-12-10: 3.75 mg via ORAL
  Filled 2016-12-10 (×3): qty 1

## 2016-12-10 MED ORDER — ALPRAZOLAM 0.25 MG PO TABS
0.2500 mg | ORAL_TABLET | Freq: Two times a day (BID) | ORAL | Status: DC | PRN
  Administered 2016-12-10 (×2): 0.25 mg via ORAL
  Filled 2016-12-10 (×2): qty 1

## 2016-12-10 MED ORDER — PANTOPRAZOLE SODIUM 40 MG PO TBEC: 40.0000 mg | DELAYED_RELEASE_TABLET | Freq: Every day | ORAL | Status: DC

## 2016-12-10 MED ORDER — HYDROCHLOROTHIAZIDE 12.5 MG PO CAPS
12.5000 mg | ORAL_CAPSULE | Freq: Every day | ORAL | Status: DC
  Administered 2016-12-10 – 2016-12-11 (×2): 12.5 mg via ORAL
  Filled 2016-12-10 (×2): qty 1

## 2016-12-10 MED ORDER — HYDROCHLOROTHIAZIDE 12.5 MG PO CAPS: 12.5000 mg | ORAL_CAPSULE | Freq: Every day | ORAL | Status: DC

## 2016-12-10 MED ORDER — VITAMIN B-12 500 MCG SL SUBL: 500.0000 ug | SUBLINGUAL_TABLET | Freq: Every day | SUBLINGUAL | Status: DC

## 2016-12-10 MED ORDER — VITAMIN D 50 MCG (2000 UT) PO CAPS: 2000.0000 [IU] | ORAL_CAPSULE | ORAL | Status: DC

## 2016-12-10 MED ORDER — ONDANSETRON HCL 4 MG/2ML IJ SOLN: 4.0000 mg | Freq: Four times a day (QID) | INTRAMUSCULAR | Status: DC | PRN

## 2016-12-10 MED ORDER — PANTOPRAZOLE SODIUM 40 MG PO TBEC
40.0000 mg | DELAYED_RELEASE_TABLET | Freq: Every day | ORAL | Status: DC
  Administered 2016-12-10 – 2016-12-11 (×2): 40 mg via ORAL
  Filled 2016-12-10 (×2): qty 1

## 2016-12-10 MED ORDER — METOPROLOL TARTRATE 25 MG PO TABS: 25.0000 mg | ORAL_TABLET | Freq: Every day | ORAL | Status: DC

## 2016-12-10 MED ORDER — GI COCKTAIL ~~LOC~~: 30.0000 mL | Freq: Four times a day (QID) | ORAL | Status: DC | PRN

## 2016-12-10 MED ORDER — VITAMIN D 1000 UNITS PO TABS
2000.0000 [IU] | ORAL_TABLET | ORAL | Status: DC
  Administered 2016-12-11: 2000 [IU] via ORAL
  Filled 2016-12-10: qty 2

## 2016-12-10 MED ORDER — RAMIPRIL 2.5 MG PO CAPS
10.0000 mg | ORAL_CAPSULE | Freq: Every day | ORAL | Status: DC
  Administered 2016-12-10 – 2016-12-11 (×2): 10 mg via ORAL
  Filled 2016-12-10: qty 4
  Filled 2016-12-10: qty 1

## 2016-12-10 MED ORDER — FAMOTIDINE 20 MG PO TABS: 20.0000 mg | ORAL_TABLET | Freq: Every day | ORAL | Status: DC

## 2016-12-10 MED ORDER — ATORVASTATIN CALCIUM 10 MG PO TABS
10.0000 mg | ORAL_TABLET | Freq: Every day | ORAL | Status: DC
  Administered 2016-12-10: 10 mg via ORAL
  Filled 2016-12-10 (×2): qty 1

## 2016-12-10 MED ORDER — WARFARIN - PHARMACIST DOSING INPATIENT
Freq: Every day | Status: DC
  Administered 2016-12-10: 21:00:00

## 2016-12-10 MED ORDER — WARFARIN SODIUM 2.5 MG PO TABS: 2.5000 mg | ORAL_TABLET | Freq: Every day | ORAL | Status: DC

## 2016-12-10 MED ORDER — VITAMIN B-12 1000 MCG PO TABS
500.0000 ug | ORAL_TABLET | Freq: Every day | ORAL | Status: DC
  Administered 2016-12-10 – 2016-12-11 (×2): 500 ug via ORAL
  Filled 2016-12-10 (×2): qty 1

## 2016-12-10 MED ORDER — ACETAMINOPHEN 325 MG PO TABS: 650.0000 mg | ORAL_TABLET | ORAL | Status: DC | PRN

## 2016-12-10 MED ORDER — METOPROLOL TARTRATE 25 MG PO TABS
25.0000 mg | ORAL_TABLET | Freq: Every day | ORAL | Status: DC
  Administered 2016-12-10: 25 mg via ORAL
  Filled 2016-12-10: qty 1

## 2016-12-10 MED ORDER — SODIUM CHLORIDE 0.9 % IV SOLN
INTRAVENOUS | Status: DC
  Administered 2016-12-10 – 2016-12-11 (×2): via INTRAVENOUS

## 2016-12-10 MED ORDER — METOPROLOL TARTRATE 25 MG PO TABS
25.0000 mg | ORAL_TABLET | Freq: Two times a day (BID) | ORAL | Status: DC
Start: 2016-12-11 — End: 2016-12-11
  Administered 2016-12-11: 25 mg via ORAL
  Filled 2016-12-10: qty 1

## 2016-12-10 MED ORDER — FAMOTIDINE 20 MG PO TABS
20.0000 mg | ORAL_TABLET | Freq: Every day | ORAL | Status: DC
  Administered 2016-12-10 – 2016-12-11 (×2): 20 mg via ORAL
  Filled 2016-12-10 (×2): qty 1

## 2016-12-10 NOTE — ED Notes (Signed)
Notified pharmacy patients's medications are not showing under pt name in pyxis.  Only PRN meds are showing

## 2016-12-10 NOTE — H&P (Signed)
History and Physical    [name] [name] GMW:[id number] DOB: 1924/06/28 DOA: 12/10/2016   PCP: Mosie Lukes, MD   Patient coming from:  Home    Chief Complaint: Chest pain  HPI: [name] [name] is a 83 y.o. female with medical history significant for HTN, HLD, hypothyroidism, anemia, B12 deficiency anxiety, mitral regurgitation, GERD, IBS, atrial fibrillation on chronic anticoagulation presenting with acute onset of back pain, eventually radiating to her chest, and worse with exertion.  Symptoms appear atypical in description.  She denies any history of chest pain or palpitations.  She denies any history of CAD, or MI.  Pain was described as a 6 out of 10, substernal, nonradiating to the jaw or to the arm.  She denies any diaphoresis, nausea or vomiting.  She denies any abdominal pain.  She denies any lower extremity swelling or calf pain.  Note, she reports that the symptoms are not similar to doses when she has GERD bouts.  She was given on transport aspirin 324 mg x1, with relief of her symptoms.  She did not receive nitroglycerin or morphine.  She denies any new herbal supplements.  She does not smoke, partake alcohol, or recreational drugs.  ED Course:  BP (!) 141/72   Pulse 78   Temp 98.2 F (36.8 C) (Oral)   Resp (!) 22   Ht 4\' 11"  (1.499 m)   Wt 46.3 kg (102 lb)   SpO2 97%   BMI 20.60 kg/m   Potassium 3.3 glucose 122 creatinine 1.09  alkaline phosphatase 16 GFR 43  PT 30.9  Troponin negative EKG atrial fibrillation without any acute changes Last echo in 2015 shows EF 44-03%, normal systolic.  Review of Systems:  As per HPI otherwise all other systems reviewed and are negative  Past Medical History:  Diagnosis Date  . Anemia   . Anxiety   . Arthritis    hips, knees  . Atrial fibrillation ([place])   . Constipation   . Cough   . Current use of long term anticoagulation   . Diarrhea   . Diverticulitis of colon 02/06/2015  . Diverticulosis   . Dysrhythmia    a-fib  .  Gait difficulty   . GERD (gastroesophageal reflux disease)   . Headache(784.0) 06/22/2012  . History of mammogram 2009  . Hyperkalemia 06/28/2012  . Hyperlipidemia   . Hyperlipidemia   . Hypertension   . Hypertension   . IBS (irritable bowel syndrome)   . Incontinence   . Loss of weight 06/05/2015  . Medicare annual wellness visit, subsequent 02/05/2014  . Melena   . Mitral and aortic regurgitation   . Mitral valve prolapse    hx of  . Osteopenia   . Paroxysmal A-fib ([place])   . Pedal edema 10/01/2016  . Personal history of colonic polyps   . Pneumonia   . Pneumonia    organism unspecified  . Preventative health care 03/18/2016  . Renal insufficiency 06/28/2012  . Renal lithiasis 06/26/2015  . Thyroid disease    hypo  . Tracheitis   . Unspecified adverse effect of other drug, medicinal and biological substance(995.29)   . UTI (lower urinary tract infection)   . Vitamin B12 deficiency 03/16/2016  . Vitamin D deficiency     Past Surgical History:  Procedure Laterality Date  . BOTOX INJECTION N/A 09/03/2012   Procedure: BOTOX INJECTION;  Surgeon: Inda Castle, MD;  Location: [facility];  Service: Endoscopy;  Laterality: N/A;  . BREAST BIOPSY Left   .  BREAST LUMPECTOMY Right 08/18/2014   Procedure: RIGHT BREAST LUMPECTOMY;  Surgeon: Coralie Keens, MD;  Location: [place];  Service: General;  Laterality: Right;  . CARDIAC ELECTROPHYSIOLOGY MAPPING AND ABLATION    . CATARACT EXTRACTION, BILATERAL    . ESOPHAGOGASTRODUODENOSCOPY N/A 09/03/2012   Procedure: ESOPHAGOGASTRODUODENOSCOPY (EGD);  Surgeon: Inda Castle, MD;  Location: [facility];  Service: Endoscopy;  Laterality: N/A;  . ESOPHAGOSCOPY W/ BOTOX INJECTION    . I&D EXTREMITY Right 11/06/2012   Procedure: IRRIGATION AND DEBRIDEMENT EXTREMITY Right Ring Finger;  Surgeon: Tennis Must, MD;  Location: [place];  Service: Orthopedics;  Laterality: Right;  . PARTIAL HYSTERECTOMY     ovaries left in place     Social History Social History   Socioeconomic History  . Marital status: Widowed    Spouse name: Not on file  . Number of children: 5  . Years of education: Not on file  . Highest education level: Not on file  Social Needs  . Financial resource strain: Not on file  . Food insecurity - worry: Not on file  . Food insecurity - inability: Not on file  . Transportation needs - medical: Not on file  . Transportation needs - non-medical: Not on file  Occupational History  . Occupation: retired    Fish farm manager: RETIRED  Tobacco Use  . Smoking status: Never Smoker  . Smokeless tobacco: Never Used  Substance and Sexual Activity  . Alcohol use: No  . Drug use: No  . Sexual activity: Not on file    Comment: lives with Daughter, grandson and his family. no dietary restricitons.  Other Topics Concern  . Not on file  Social History Narrative  . Not on file     Allergies  Allergen Reactions  . Darifenacin Hydrobromide     REACTION: causes her dizziness  . Sulfonamide Derivatives   . Tramadol Other (See Comments)    Insomnia, anorexia    Family History  Problem Relation Age of Onset  . Diabetes Mother   . CVA Mother   . Stroke Mother   . COPD Father   . Rheum arthritis Father   . Allergies Daughter   . COPD Daughter   . Stroke Daughter   . COPD Daughter        previous smoker  . Stroke Maternal Grandfather   . Heart disease Maternal Aunt   . Heart disease Maternal Uncle   . Colon cancer Neg Hx   . Colon polyps Neg Hx   . Esophageal cancer Neg Hx   . Gallbladder disease Neg Hx   . Kidney disease Neg Hx   . Heart attack Neg Hx   . Hypertension Neg Hx       Prior to Admission medications   Medication Sig Start Date End Date Taking? Authorizing Provider  ALPRAZolam Duanne Moron) 0.25 MG tablet TAKE 1/2-1 TABLET BY MOUTH TWICE DAILY AS NEEDED 09/23/16  Yes Mosie Lukes, MD  atorvastatin (LIPITOR) 10 MG tablet TAKE 1 TABLET BY MOUTH DAILY AT 6 PM. Patient taking  differently: TAKE 1 TABLET 10mg   BY MOUTH DAILY AT 6 PM. 11/27/16  Yes Ann Held, DO  Cholecalciferol (VITAMIN D) 2000 UNITS CAPS Take 2,000 Units by mouth every other day. 02/20/12  Yes Mosie Lukes, MD  Cyanocobalamin (VITAMIN B-12) 500 MCG SUBL Place 1 tablet (500 mcg total) under the tongue daily at 2 PM. 06/22/16  Yes Mosie Lukes, MD  hydrochlorothiazide (MICROZIDE) 12.5 MG capsule  Take 1 capsule (12.5 mg total) by mouth daily. 10/01/16  Yes Mosie Lukes, MD  Lactobacillus-Inulin (PROBIOTIC DIGESTIVE SUPPORT PO) Take 1 capsule by mouth daily.   Yes [provider]  metoprolol tartrate (LOPRESSOR) 25 MG tablet Take 1.5 tablets (37.5 mg total) by mouth 1 day or 1 dose. Patient taking differently: Take 25 mg by mouth 1 day or 1 dose.  09/12/16 12/10/16 Yes Saguier, Percell Miller, PA-C  pantoprazole (PROTONIX) 40 MG tablet TAKE 1 TABLET (40 MG TOTAL) BY MOUTH DAILY. 09/21/16  Yes Mosie Lukes, MD  ramipril (ALTACE) 10 MG capsule TAKE 1 CAPSULE (10 MG TOTAL) BY MOUTH DAILY. 10/12/16  Yes Mosie Lukes, MD  ranitidine (ZANTAC) 150 MG tablet Take 1 tablet (150 mg total) by mouth at bedtime. 08/29/16  Yes Mosie Lukes, MD  warfarin (COUMADIN) 2.5 MG tablet TAKE AS DIRECTED BY ANTICOAGULATION CLINIC. A 30 DAY SUPPLY 11/07/16  Yes Dorothy Spark, MD    Physical Exam:  Vitals:   12/10/16 1245 12/10/16 1300 12/10/16 1315 12/10/16 1330  BP: (!) 149/65 (!) 151/79 133/72 (!) 141/72  Pulse: 77 78 79 78  Resp: 15 16 18  (!) 22  Temp:      TempSrc:      SpO2: 98% 99% 99% 97%  Weight:      Height:       Constitutional: NAD, calm, comfortable .  Looks younger than stated age Eyes: PERRL, lids and conjunctivae normal ENMT: Mucous membranes are moist, without exudate or lesions.  Several missing teeth. Neck: normal, supple, no masses, no thyromegaly Respiratory: clear to auscultation bilaterally, no wheezing, no crackles. Normal respiratory effort .  Significant kyphosis  noted Cardiovascular: Irregularly irregular rate and rhythm, soft 1 out of 6 murmur, rubs or gallops. No extremity edema. 2+ pedal pulses. No carotid bruits.  Abdomen: Soft, non tender, No hepatosplenomegaly. Bowel sounds positive.  Musculoskeletal: no clubbing / cyanosis. Moves all extremities Skin: no jaundice, No lesions.  Neurologic: Sensation intact  Strength equal in all extremities Psychiatric:   Alert and oriented x 3. Normal mood.     Labs on Admission: I have personally reviewed following labs and imaging studies  CBC: Recent Labs  Lab 12/10/16 0942  WBC 7.0  NEUTROABS 5.6  HGB 12.3  HCT 36.0  MCV 90.5  PLT 546    Basic Metabolic Panel: Recent Labs  Lab 12/10/16 0942  NA 140  K 3.3*  CL 103  CO2 28  GLUCOSE 122*  BUN 15  CREATININE 1.09*  CALCIUM 8.8*    GFR: Estimated Creatinine Clearance: 23.4 mL/min (A) (by C-G formula based on SCr of 1.09 mg/dL (H)).  Liver Function Tests: Recent Labs  Lab 12/10/16 0942  AST 23  ALT 14  ALKPHOS 16*  BILITOT 0.8  PROT 6.7  ALBUMIN 3.6   No results for input(s): LIPASE, AMYLASE in the last 168 hours. No results for input(s): AMMONIA in the last 168 hours.  Coagulation Profile: Recent Labs  Lab 12/10/16 0942  INR 3.00    Cardiac Enzymes: Recent Labs  Lab 12/10/16 0942  TROPONINI <0.03    BNP (last 3 results) No results for input(s): PROBNP in the last 8760 hours.  HbA1C: No results for input(s): HGBA1C in the last 72 hours.  CBG: No results for input(s): GLUCAP in the last 168 hours.  Lipid Profile: No results for input(s): CHOL, HDL, LDLCALC, TRIG, CHOLHDL, LDLDIRECT in the last 72 hours.  Thyroid Function Tests: No results for input(s):  TSH, T4TOTAL, FREET4, T3FREE, THYROIDAB in the last 72 hours.  Anemia Panel: No results for input(s): VITAMINB12, FOLATE, FERRITIN, TIBC, IRON, RETICCTPCT in the last 72 hours.  Urine analysis:    Component Value Date/Time   COLORURINE YELLOW  07/24/2016 1230   APPEARANCEUR CLEAR 07/24/2016 1230   LABSPEC 1.009 07/24/2016 1230   PHURINE 7.5 07/24/2016 1230   GLUCOSEU NEGATIVE 07/24/2016 1230   GLUCOSEU NEGATIVE 06/20/2015 1617   HGBUR NEGATIVE 07/24/2016 1230   HGBUR trace-intact 01/19/2009 0929   BILIRUBINUR NEGATIVE 07/24/2016 1230   BILIRUBINUR negative 09/15/2015 1157   KETONESUR NEGATIVE 07/24/2016 1230   PROTEINUR NEGATIVE 07/24/2016 1230   UROBILINOGEN negative 09/15/2015 1157   UROBILINOGEN 0.2 06/20/2015 1617   NITRITE NEGATIVE 07/24/2016 1230   LEUKOCYTESUR NEGATIVE 07/24/2016 1230    Sepsis Labs: @LABRCNTIP (procalcitonin:4,lacticidven:4) )No results found for this or any previous visit (from the past 240 hour(s)).   Radiological Exams on Admission: Dg Chest 2 View  Result Date: 12/10/2016 CLINICAL DATA:  Back pain extending into the chest starting last night EXAM: CHEST  2 VIEW COMPARISON:  02/21/2016 FINDINGS: Moderate-sized hiatal hernia, overall stable appearance. Borderline enlargement of the cardiopericardial silhouette. Cephalization of blood flow favoring pulmonary venous hypertension. The patient is rotated to the right on today's radiograph, reducing diagnostic sensitivity and specificity. Thoracic spondylosis. Atherosclerotic calcification of the aortic arch. Thoracic kyphosis. No pleural effusion. Chronic biapical scarring. IMPRESSION: 1. Borderline enlargement of the cardiopericardial silhouette with cephalization of blood flow suggesting pulmonary venous hypertension, but without overt edema. 2.  Aortic Atherosclerosis (ICD10-I70.0). 3. Mild chronic biapical scarring. 4. Stable appearance of moderate-sized hiatal hernia. Electronically Signed   By: Van Clines M.D.   On: 12/10/2016 10:53    EKG: Independently reviewed.  Assessment/Plan Active Problems:   Chest pain   Hypothyroidism   Vitamin D deficiency   Hyperlipidemia, mixed   Anemia   Anxiety state   Essential hypertension    ESOPHAGEAL STRICTURE   GERD   Long term (current) use of anticoagulants   Renal insufficiency    Chest pain syndrome  HEART score is 4. Troponin negative to date, EKG without evidence of ACS. CP relieved by aspirin. CXR unrevealing.    Admit to Telemetry/ Observation Chest pain order set Cycle troponins EKG in am continue ASA, O2 and NTG as needed Statins  GI cocktail Check Lipid panel   GERD and history of esophageal stricture  Continue PPI GI cocktail   Atrial Fibrillation CHA2DS2-VASc score 4, on anticoagulation with Coumadin. Last echo in 2015 shows EF 02-40%, normal systolic.  Rate controlled Continue meds/ Coumadin    Hypertension BP 141/72   Pulse 78    Controlled Continue home anti-hypertensive medications   Hyperlipidemia Continue home statins Lipid panel   Chronic kidney disease stage 2-3     baseline creatinine 0.99, current at 1.09    Lab Results  Component Value Date   CREATININE 1.09 (H) 12/10/2016   CREATININE 0.99 11/01/2016   CREATININE 0.92 09/24/2016    Repeat CMET in am, no acute intervention indicated     Anxiety Continue home Xanax   Hypokalemia, K3.3, patient did not take her meds or had any food  today EKG atrial fibrillation without any acute changes Resume meds, including her ACE I.   CMET in am, if not improved will replenish     DVT prophylaxis: Coumadin as per pharmacy Code Status:    Full Family Communication:  Discussed with patient Disposition Plan: Expect patient to be discharged to  home after condition improves Consults called:    None Admission status: Telemetry observation   Sharene Butters, PA-C [facility]   12/10/2016, 1:46 PM

## 2016-12-10 NOTE — ED Notes (Signed)
Patient transported to X-ray 

## 2016-12-10 NOTE — Progress Notes (Signed)
ANTICOAGULATION CONSULT NOTE - Initial Consult  Pharmacy Consult for warfarin Indication: atrial fibrillation  Allergies  Allergen Reactions  . Darifenacin Hydrobromide     REACTION: causes her dizziness  . Sulfonamide Derivatives   . Tramadol Other (See Comments)    Insomnia, anorexia    Patient Measurements: Height: 4\' 11"  (149.9 cm) Weight: 102 lb (46.3 kg) IBW/kg (Calculated) : 43.2  Vital Signs: Temp: 98.2 F (36.8 C) (12/10 0939) Temp Source: Oral (12/10 0939) BP: 139/73 (12/10 1345) Pulse Rate: 78 (12/10 1345)  Labs: Recent Labs    12/10/16 0942  HGB 12.3  HCT 36.0  PLT 159  LABPROT 30.9*  INR 3.00  CREATININE 1.09*  TROPONINI <0.03    Estimated Creatinine Clearance: 23.4 mL/min (A) (by C-G formula based on SCr of 1.09 mg/dL (H)).   Medical History: Past Medical History:  Diagnosis Date  . Anemia   . Anxiety   . Arthritis    hips, knees  . Atrial fibrillation ([place])   . Constipation   . Cough   . Current use of long term anticoagulation   . Diarrhea   . Diverticulitis of colon 02/06/2015  . Diverticulosis   . Dysrhythmia    a-fib  . Gait difficulty   . GERD (gastroesophageal reflux disease)   . Headache(784.0) 06/22/2012  . History of mammogram 2009  . Hyperkalemia 06/28/2012  . Hyperlipidemia   . Hyperlipidemia   . Hypertension   . Hypertension   . IBS (irritable bowel syndrome)   . Incontinence   . Loss of weight 06/05/2015  . Medicare annual wellness visit, subsequent 02/05/2014  . Melena   . Mitral and aortic regurgitation   . Mitral valve prolapse    hx of  . Osteopenia   . Paroxysmal A-fib ([place])   . Pedal edema 10/01/2016  . Personal history of colonic polyps   . Pneumonia   . Pneumonia    organism unspecified  . Preventative health care 03/18/2016  . Renal insufficiency 06/28/2012  . Renal lithiasis 06/26/2015  . Thyroid disease    hypo  . Tracheitis   . Unspecified adverse effect of other drug, medicinal and biological  substance(995.29)   . UTI (lower urinary tract infection)   . Vitamin B12 deficiency 03/16/2016  . Vitamin D deficiency     Medications:  Scheduled:  . atorvastatin  10 mg Oral q1800  . [START ON 12/11/2016] cholecalciferol  2,000 Units Oral QODAY  . vitamin B-12  500 mcg Oral Daily  . famotidine  20 mg Oral Daily  . hydrochlorothiazide  12.5 mg Oral Daily  . metoprolol tartrate  25 mg Oral Daily  . pantoprazole  40 mg Oral Daily  . ramipril  10 mg Oral Daily  . warfarin  3.75 mg Oral ONCE-1800  . Warfarin - Pharmacist Dosing Inpatient   Does not apply q1800    Assessment: 47 YOF presenting with back and chest pain on anticoagulation PTA for Afib.  For anticoagulation she takes warfarin 2.5mg  tabs, one whole tab every day of the week except Monday where she takes 1.5 tabs; She states not taking her Monday dose before admission. INR of 3 at admission at goal, CBC wnl and no bleeding observed.     Goal of Therapy:  INR 2-3 Monitor platelets by anticoagulation protocol: Yes   Plan:  Warfarin 3.75mg  PO x 1 @1800  Monitor daily INR, CBC S/s bleeding  [name] [name] 12/10/2016,2:09 PM

## 2016-12-10 NOTE — Progress Notes (Signed)
  Echocardiogram 2D Echocardiogram has been performed.  Berlinda Farve T Alishah Schulte 12/10/2016, 2:31 PM

## 2016-12-10 NOTE — ED Triage Notes (Signed)
Pt arrived from home where she lives with daughter. C/O pain in back that started last night then came into her chest. Back pain has resolved and pt is CP free upon arrival. EMS gave 324 ASA PTA, no Nitro. Pt denies nausea or SOB. A&OX4.

## 2016-12-10 NOTE — ED Notes (Signed)
Spoke with [facility] to have medications verified

## 2016-12-10 NOTE — ED Provider Notes (Signed)
[name]ERGENCY DEPARTMENT Provider Note   CSN: [id number] Arrival date & time: 12/10/16  6789     History   Chief Complaint Chief Complaint  Patient presents with  . Chest Pain    HPI [name] [name] is a 83 y.o. female.  Pt comes in with c/o back pain that the came into her chest that started this morning upon waking and moving around. She states that when she stopped moving around the pain eased up and the pain resolved with getting asa from ems. Denies history of chest pain. She does have a history of a-fib but doesn't feel like her heart has been beating fast. Denies sob, nausea, vomiting, cough, fever. She is not currently having any chest pain. Does have a history of gerd but this doesn't feel similar      Past Medical History:  Diagnosis Date  . Anemia   . Anxiety   . Arthritis    hips, knees  . Atrial fibrillation ([place])   . Constipation   . Cough   . Current use of long term anticoagulation   . Diarrhea   . Diverticulitis of colon 02/06/2015  . Diverticulosis   . Dysrhythmia    a-fib  . Gait difficulty   . GERD (gastroesophageal reflux disease)   . Headache(784.0) 06/22/2012  . History of mammogram 2009  . Hyperkalemia 06/28/2012  . Hyperlipidemia   . Hyperlipidemia   . Hypertension   . Hypertension   . IBS (irritable bowel syndrome)   . Incontinence   . Loss of weight 06/05/2015  . Medicare annual wellness visit, subsequent 02/05/2014  . Melena   . Mitral and aortic regurgitation   . Mitral valve prolapse    hx of  . Osteopenia   . Paroxysmal A-fib ([place])   . Pedal edema 10/01/2016  . Personal history of colonic polyps   . Pneumonia   . Pneumonia    organism unspecified  . Preventative health care 03/18/2016  . Renal insufficiency 06/28/2012  . Renal lithiasis 06/26/2015  . Thyroid disease    hypo  . Tracheitis   . Unspecified adverse effect of other drug, medicinal and biological substance(995.29)   . UTI (lower urinary  tract infection)   . Vitamin B12 deficiency 03/16/2016  . Vitamin D deficiency     Patient Active Problem List   Diagnosis Date Noted  . Pedal edema 10/01/2016  . Preventative health care 03/18/2016  . Vitamin B12 deficiency 03/16/2016  . Renal lithiasis 06/26/2015  . Loss of weight 06/05/2015  . Diverticulitis of colon 02/06/2015  . Right-sided thoracic back pain 11/15/2014  . Nipple discharge 06/21/2014  . Rib pain on right side 04/29/2014  . Medicare annual wellness visit, subsequent 02/05/2014  . Abdominal pain, chronic, right lower quadrant 02/03/2014  . Rectal bleeding 12/03/2013  . Prolapse of female pelvic organs 12/03/2013  . Leukocytes in urine 11/25/2013  . Abnormal chest x-ray 10/23/2013  . Pulmonary hypertension ([place]) 10/09/2013  . Tricuspid regurgitation 10/09/2013  . Chronic anticoagulation 10/09/2013  . Hyperglycemia 08/24/2013  . Encounter for therapeutic drug monitoring 03/13/2013  . Renal insufficiency 06/28/2012  . Hyperkalemia 06/28/2012  . Headache(784.0) 06/22/2012  . Sinus bradycardia 09/20/2011  . Increased urinary frequency 02/13/2011  . Arthritis 11/13/2010  . Long term (current) use of anticoagulants 03/31/2010  . DIZZINESS 08/03/2009  . ACHALASIA 05/18/2009  . DYSPHAGIA UNSPECIFIED 04/11/2009  . Anemia 01/19/2009  . ESOPHAGEAL STRICTURE 01/19/2009  . ANXIETY 09/15/2008  . MITRAL REGURGITATION,  MILD 08/20/2008  . PAROXYSMAL ATRIAL FIBRILLATION 08/20/2008  . MITRAL VALVE PROLAPSE, HX OF 08/20/2008  . Vitamin D deficiency 01/15/2008  . IBS 02/03/2007  . Hypothyroidism 01/29/2007  . Hyperlipidemia, mixed 01/29/2007  . Essential hypertension 01/29/2007  . GERD 01/29/2007  . Disorder of bone and cartilage 01/29/2007  . COLONIC POLYPS, HX OF 01/29/2007    Past Surgical History:  Procedure Laterality Date  . BOTOX INJECTION N/A 09/03/2012   Procedure: BOTOX INJECTION;  Surgeon: Inda Castle, MD;  Location: [facility];  Service: Endoscopy;   Laterality: N/A;  . BREAST BIOPSY Left   . BREAST LUMPECTOMY Right 08/18/2014   Procedure: RIGHT BREAST LUMPECTOMY;  Surgeon: Coralie Keens, MD;  Location: [facility];  Service: General;  Laterality: Right;  . CARDIAC ELECTROPHYSIOLOGY MAPPING AND ABLATION    . CATARACT EXTRACTION, BILATERAL    . ESOPHAGOGASTRODUODENOSCOPY N/A 09/03/2012   Procedure: ESOPHAGOGASTRODUODENOSCOPY (EGD);  Surgeon: Inda Castle, MD;  Location: [facility];  Service: Endoscopy;  Laterality: N/A;  . ESOPHAGOSCOPY W/ BOTOX INJECTION    . I&D EXTREMITY Right 11/06/2012   Procedure: IRRIGATION AND DEBRIDEMENT EXTREMITY Right Ring Finger;  Surgeon: Tennis Must, MD;  Location: [facility];  Service: Orthopedics;  Laterality: Right;  . PARTIAL HYSTERECTOMY     ovaries left in place    OB History    No data available       Home Medications    Prior to Admission medications   Medication Sig Start Date End Date Taking? Authorizing Provider  ALPRAZolam Duanne Moron) 0.25 MG tablet TAKE 1/2-1 TABLET BY MOUTH TWICE DAILY AS NEEDED 09/23/16   Mosie Lukes, MD  atorvastatin (LIPITOR) 10 MG tablet TAKE 1 TABLET BY MOUTH DAILY AT 6 PM. 11/27/16   Ann Held, DO  Cholecalciferol (VITAMIN D) 2000 UNITS CAPS Take 2,000 Units by mouth every other day. 02/20/12   Mosie Lukes, MD  Cyanocobalamin (VITAMIN B-12) 500 MCG SUBL Place 1 tablet (500 mcg total) under the tongue daily at 2 PM. 06/22/16   Mosie Lukes, MD  hydrochlorothiazide (MICROZIDE) 12.5 MG capsule Take 1 capsule (12.5 mg total) by mouth daily. 10/01/16   Mosie Lukes, MD  Lactobacillus-Inulin (PROBIOTIC DIGESTIVE SUPPORT PO) Take 1 capsule by mouth daily.    [provider]  metoprolol tartrate (LOPRESSOR) 25 MG tablet Take 1.5 tablets (37.5 mg total) by mouth 1 day or 1 dose. Patient taking differently: Take 25 mg by mouth 1 day or 1 dose.  09/12/16 09/13/16  Saguier, Percell Miller, PA-C  pantoprazole (PROTONIX) 40 MG tablet TAKE 1  TABLET (40 MG TOTAL) BY MOUTH DAILY. 09/21/16   Mosie Lukes, MD  ramipril (ALTACE) 10 MG capsule TAKE 1 CAPSULE (10 MG TOTAL) BY MOUTH DAILY. 10/12/16   Mosie Lukes, MD  ranitidine (ZANTAC) 150 MG tablet Take 1 tablet (150 mg total) by mouth at bedtime. 08/29/16   Mosie Lukes, MD  warfarin (COUMADIN) 2.5 MG tablet TAKE AS DIRECTED BY ANTICOAGULATION CLINIC. A 30 DAY SUPPLY 11/07/16   Dorothy Spark, MD    Family History Family History  Problem Relation Age of Onset  . Diabetes Mother   . CVA Mother   . Stroke Mother   . COPD Father   . Rheum arthritis Father   . Allergies Daughter   . COPD Daughter   . Stroke Daughter   . COPD Daughter        previous smoker  . Stroke Maternal Grandfather   .  Heart disease Maternal Aunt   . Heart disease Maternal Uncle   . Colon cancer Neg Hx   . Colon polyps Neg Hx   . Esophageal cancer Neg Hx   . Gallbladder disease Neg Hx   . Kidney disease Neg Hx   . Heart attack Neg Hx   . Hypertension Neg Hx     Social History Social History   Tobacco Use  . Smoking status: Never Smoker  . Smokeless tobacco: Never Used  Substance Use Topics  . Alcohol use: No  . Drug use: No     Allergies   Darifenacin hydrobromide; Sulfonamide derivatives; and Tramadol   Review of Systems Review of Systems  All other systems reviewed and are negative.    Physical Exam Updated Vital Signs BP (!) 156/75 (BP Location: Right Arm)   Pulse 81   Temp 98.2 F (36.8 C) (Oral)   Resp 17   Ht 4\' 11"  (1.499 m)   Wt 46.3 kg (102 lb)   SpO2 98%   BMI 20.60 kg/m   Physical Exam  Constitutional: She appears well-developed and well-nourished.  HENT:  Head: Normocephalic and atraumatic.  Neck: Normal range of motion. Neck supple.  Cardiovascular: Normal rate, regular rhythm, intact distal pulses and normal pulses.  Pulmonary/Chest: Effort normal and breath sounds normal.  Abdominal: Soft. Bowel sounds are normal.  Musculoskeletal: Normal  range of motion.  Neurological: She is alert.  Skin: Skin is warm and dry.  Psychiatric: She has a normal mood and affect. Her behavior is normal.  Nursing note and vitals reviewed.    ED Treatments / Results  Labs (all labs ordered are listed, but only abnormal results are displayed) Labs Reviewed  COMPREHENSIVE METABOLIC PANEL - Abnormal; Notable for the following components:      Result Value   Potassium 3.3 (*)    Glucose, Bld 122 (*)    Creatinine, Ser 1.09 (*)    Calcium 8.8 (*)    Alkaline Phosphatase 16 (*)    GFR calc non Af Amer 43 (*)    GFR calc Af Amer 50 (*)    All other components within normal limits  PROTIME-INR - Abnormal; Notable for the following components:   Prothrombin Time 30.9 (*)    All other components within normal limits  CBC WITH DIFFERENTIAL/PLATELET  TROPONIN I    EKG  EKG Interpretation None       Radiology No results found.  Procedures Procedures (including critical care time)  Medications Ordered in ED Medications - No data to display   Initial Impression / Assessment and Plan / ED Course  I have reviewed the triage vital signs and the nursing notes.  Pertinent labs & imaging results that were available during my care of the patient were reviewed by me and considered in my medical decision making (see chart for details).     Trop not elevated and no acute finding on LXB:WIOMB pt needs to be admitted for a cp rule out   Final Clinical Impressions(s) / ED Diagnoses   Final diagnoses:  None    ED Discharge Orders    None       Glendell Docker, NP 12/10/16 1221    Pattricia Boss, MD 12/10/16 1616

## 2016-12-11 DIAGNOSIS — Z7901 Long term (current) use of anticoagulants: Secondary | ICD-10-CM | POA: Diagnosis not present

## 2016-12-11 DIAGNOSIS — K222 Esophageal obstruction: Secondary | ICD-10-CM

## 2016-12-11 DIAGNOSIS — R079 Chest pain, unspecified: Secondary | ICD-10-CM | POA: Diagnosis not present

## 2016-12-11 DIAGNOSIS — I480 Paroxysmal atrial fibrillation: Secondary | ICD-10-CM | POA: Diagnosis not present

## 2016-12-11 DIAGNOSIS — R0789 Other chest pain: Secondary | ICD-10-CM | POA: Diagnosis not present

## 2016-12-11 DIAGNOSIS — K219 Gastro-esophageal reflux disease without esophagitis: Secondary | ICD-10-CM | POA: Diagnosis not present

## 2016-12-11 DIAGNOSIS — I129 Hypertensive chronic kidney disease with stage 1 through stage 4 chronic kidney disease, or unspecified chronic kidney disease: Secondary | ICD-10-CM | POA: Diagnosis not present

## 2016-12-11 LAB — COMPREHENSIVE METABOLIC PANEL
ALK PHOS: 12 U/L — AB (ref 38–126)
ALT: 13 U/L — ABNORMAL LOW (ref 14–54)
ANION GAP: 10 (ref 5–15)
AST: 19 U/L (ref 15–41)
Albumin: 3.1 g/dL — ABNORMAL LOW (ref 3.5–5.0)
BUN: 12 mg/dL (ref 6–20)
CALCIUM: 8.5 mg/dL — AB (ref 8.9–10.3)
CHLORIDE: 104 mmol/L (ref 101–111)
CO2: 25 mmol/L (ref 22–32)
Creatinine, Ser: 0.89 mg/dL (ref 0.44–1.00)
GFR calc non Af Amer: 55 mL/min — ABNORMAL LOW (ref >60)
Glucose, Bld: 90 mg/dL (ref 65–99)
POTASSIUM: 3.1 mmol/L — AB (ref 3.5–5.1)
SODIUM: 139 mmol/L (ref 135–145)
Total Bilirubin: 0.5 mg/dL (ref 0.3–1.2)
Total Protein: 6.1 g/dL — ABNORMAL LOW (ref 6.5–8.1)

## 2016-12-11 LAB — CBC
HCT: 35.5 % — ABNORMAL LOW (ref 36.0–46.0)
Hemoglobin: 11.9 g/dL — ABNORMAL LOW (ref 12.0–15.0)
MCH: 30.4 pg (ref 26.0–34.0)
MCHC: 33.5 g/dL (ref 30.0–36.0)
MCV: 90.6 fL (ref 78.0–100.0)
PLATELETS: 159 10*3/uL (ref 150–400)
RBC: 3.92 MIL/uL (ref 3.87–5.11)
RDW: 14.5 % (ref 11.5–15.5)
WBC: 4.9 10*3/uL (ref 4.0–10.5)

## 2016-12-11 LAB — PROTIME-INR
INR: 2.7
Prothrombin Time: 28.4 seconds — ABNORMAL HIGH (ref 11.4–15.2)

## 2016-12-11 MED ORDER — POTASSIUM CHLORIDE CRYS ER 20 MEQ PO TBCR: 40.0000 meq | EXTENDED_RELEASE_TABLET | Freq: Once | ORAL | Status: DC

## 2016-12-11 MED ORDER — METOPROLOL TARTRATE 25 MG PO TABS: 25.0000 mg | ORAL_TABLET | ORAL | Status: DC

## 2016-12-11 MED ORDER — WARFARIN SODIUM 2.5 MG PO TABS: 2.5000 mg | ORAL_TABLET | Freq: Once | ORAL | Status: DC

## 2016-12-11 NOTE — Progress Notes (Signed)
ANTICOAGULATION CONSULT NOTE - Initial Consult  Pharmacy Consult for warfarin Indication: atrial fibrillation  Allergies  Allergen Reactions  . Darifenacin Hydrobromide     REACTION: causes her dizziness  . Sulfonamide Derivatives   . Tramadol Other (See Comments)    Insomnia, anorexia    Patient Measurements: Height: 4\' 11"  (149.9 cm) Weight: 98 lb 1.6 oz (44.5 kg)(scale b) IBW/kg (Calculated) : 43.2  Vital Signs: Temp: 98.1 F (36.7 C) (12/11 0753) Temp Source: Oral (12/11 0753) BP: 155/74 (12/11 0753) Pulse Rate: 78 (12/11 0753)  Labs: Recent Labs    12/10/16 0942 12/10/16 1402 12/10/16 1646 12/10/16 1925 12/11/16 0324 12/11/16 0834  HGB 12.3  --   --   --  11.9*  --   HCT 36.0  --   --   --  35.5*  --   PLT 159  --   --   --  159  --   LABPROT 30.9*  --   --   --   --  28.4*  INR 3.00  --   --   --   --  2.70  CREATININE 1.09*  --   --   --  0.89  --   TROPONINI <0.03 <0.03 <0.03 <0.03  --   --     Estimated Creatinine Clearance: 28.7 mL/min (by C-G formula based on SCr of 0.89 mg/dL).   Medical History: Past Medical History:  Diagnosis Date  . Anemia   . Anxiety   . Arthritis    hips, knees  . Atrial fibrillation ([place])   . Constipation   . Cough   . Current use of long term anticoagulation   . Diarrhea   . Diverticulitis of colon 02/06/2015  . Diverticulosis   . Dysrhythmia    a-fib  . Gait difficulty   . GERD (gastroesophageal reflux disease)   . Headache(784.0) 06/22/2012  . History of mammogram 2009  . Hyperkalemia 06/28/2012  . Hyperlipidemia   . Hyperlipidemia   . Hypertension   . Hypertension   . IBS (irritable bowel syndrome)   . Incontinence   . Loss of weight 06/05/2015  . Medicare annual wellness visit, subsequent 02/05/2014  . Melena   . Mitral and aortic regurgitation   . Mitral valve prolapse    hx of  . Osteopenia   . Paroxysmal A-fib ([place])   . Pedal edema 10/01/2016  . Personal history of colonic polyps   . Pneumonia   .  Pneumonia    organism unspecified  . Preventative health care 03/18/2016  . Renal insufficiency 06/28/2012  . Renal lithiasis 06/26/2015  . Thyroid disease    hypo  . Tracheitis   . Unspecified adverse effect of other drug, medicinal and biological substance(995.29)   . UTI (lower urinary tract infection)   . Vitamin B12 deficiency 03/16/2016  . Vitamin D deficiency     Medications:  Scheduled:  . atorvastatin  10 mg Oral q1800  . cholecalciferol  2,000 Units Oral QODAY  . vitamin B-12  500 mcg Oral Daily  . famotidine  20 mg Oral Daily  . metoprolol tartrate  25 mg Oral BID  . pantoprazole  40 mg Oral Daily  . potassium chloride  40 mEq Oral Once  . ramipril  10 mg Oral Daily  . Warfarin - Pharmacist Dosing Inpatient   Does not apply q1800    Assessment: 32 YOF presenting with back and chest pain on warfarin PTA for Afib. PTA warfarin regimen: warfarin 2.5mg   daily except Monday she takes 3.75mg ; INR=3 at admission  INR= 2.7, Hgb stable 11.9, PLT WNL, no bleeding noted   Goal of Therapy:  INR 2-3 Monitor platelets by anticoagulation protocol: Yes   Plan:  Warfarin 2.5mg  PO x 1 @1800  Monitor daily INR, CBC S/s bleeding  [name] [name] [name] 12/11/2016,10:15 AM

## 2016-12-11 NOTE — Discharge Summary (Signed)
Physician Discharge Summary  [name] [name] ZOX:[id number] DOB: [birth year] DOA: 12/10/2016  PCP: Mosie Lukes, MD  Admit date: 12/10/2016 Discharge date: 12/11/2016   Recommendations for Outpatient Follow-Up:   1. outpatinet GI follow up-- refused eval in hospital-- regurgitating food   Discharge Diagnosis:   Active Problems:   Hypothyroidism   Vitamin D deficiency   Hyperlipidemia, mixed   Anemia   Anxiety state   Essential hypertension   ESOPHAGEAL STRICTURE   GERD   Long term (current) use of anticoagulants   Renal insufficiency   Chest pain   Discharge disposition:  Home  Discharge Condition: Improved.  Diet recommendation: GERD  Wound care: None.   History of Present Illness:   [name] [name] is a 83 y.o. female with medical history significant for HTN, HLD, hypothyroidism, anemia, B12 deficiency anxiety, mitral regurgitation, GERD, IBS, atrial fibrillation on chronic anticoagulation presenting with acute onset of back pain, eventually radiating to her chest, and worse with exertion.  Symptoms appear atypical in description.  She denies any history of chest pain or palpitations.  She denies any history of CAD, or MI.  Pain was described as a 6 out of 10, substernal, nonradiating to the jaw or to the arm.  She denies any diaphoresis, nausea or vomiting.  She denies any abdominal pain.  She denies any lower extremity swelling or calf pain.  Note, she reports that the symptoms are not similar to doses when she has GERD bouts.  She was given on transport aspirin 324 mg x1, with relief of her symptoms.  She did not receive nitroglycerin or morphine.  She denies any new herbal supplements.  She does not smoke, partake alcohol, or recreational drugs.     Hospital Course by Problem:   Chest pain syndrome  -suspect more GI related -may need botox injection -patient prefers outpatient follow up with GI -CE negative  GERD and history of esophageal stricture    Continue PPI GI cocktail   Atrial Fibrillation CHA2DS2-VASc score 4, on anticoagulation with Coumadin. Last echo in 2015 shows EF 81-19%, normal systolic. Echo 12/18: - Left ventricle: The cavity size was normal. There was mild concentric hypertrophy. Systolic function was normal. The estimated ejection fraction was in the range of 55% to 60%. Wall motion was normal; there were no regional wall motion abnormalities. The study is not technically sufficient to allow evaluation of LV diastolic function.  Rate controlled Continue meds/ Coumadin    Hypertension BP  Continue home anti-hypertensive medications   Hyperlipidemia Continue home statins  Chronic kidney disease stage 2-3     -stable  Anxiety Continue home Xanax   Hypokalemia -repleted       Medical Consultants:    None.   Discharge Exam:   Vitals:   12/11/16 0428 12/11/16 0753  BP: 123/84 (!) 155/74  Pulse: 76 78  Resp: 18 18  Temp: 97.6 F (36.4 C) 98.1 F (36.7 C)  SpO2: 98% 100%   Vitals:   12/10/16 2030 12/11/16 0000 12/11/16 0428 12/11/16 0753  BP: 140/80 130/80 123/84 (!) 155/74  Pulse: 75  76 78  Resp:   18 18  Temp:   97.6 F (36.4 C) 98.1 F (36.7 C)  TempSrc:   Oral Oral  SpO2: 98%  98% 100%  Weight:   44.5 kg (98 lb 1.6 oz)   Height:        Gen:  NAD   The results of significant diagnostics from this hospitalization (including imaging,  microbiology, ancillary and laboratory) are listed below for reference.     Procedures and Diagnostic Studies:   Dg Chest 2 View  Result Date: 12/10/2016 CLINICAL DATA:  Back pain extending into the chest starting last night EXAM: CHEST  2 VIEW COMPARISON:  02/21/2016 FINDINGS: Moderate-sized hiatal hernia, overall stable appearance. Borderline enlargement of the cardiopericardial silhouette. Cephalization of blood flow favoring pulmonary venous hypertension. The patient is rotated to the right on today's radiograph, reducing diagnostic  sensitivity and specificity. Thoracic spondylosis. Atherosclerotic calcification of the aortic arch. Thoracic kyphosis. No pleural effusion. Chronic biapical scarring. IMPRESSION: 1. Borderline enlargement of the cardiopericardial silhouette with cephalization of blood flow suggesting pulmonary venous hypertension, but without overt edema. 2.  Aortic Atherosclerosis (ICD10-I70.0). 3. Mild chronic biapical scarring. 4. Stable appearance of moderate-sized hiatal hernia. Electronically Signed   By: Van Clines M.D.   On: 12/10/2016 10:53     Labs:   Basic Metabolic Panel: Recent Labs  Lab 12/10/16 0942 12/11/16 0324  NA 140 139  K 3.3* 3.1*  CL 103 104  CO2 28 25  GLUCOSE 122* 90  BUN 15 12  CREATININE 1.09* 0.89  CALCIUM 8.8* 8.5*   GFR Estimated Creatinine Clearance: 28.7 mL/min (by C-G formula based on SCr of 0.89 mg/dL). Liver Function Tests: Recent Labs  Lab 12/10/16 0942 12/11/16 0324  AST 23 19  ALT 14 13*  ALKPHOS 16* 12*  BILITOT 0.8 0.5  PROT 6.7 6.1*  ALBUMIN 3.6 3.1*   No results for input(s): LIPASE, AMYLASE in the last 168 hours. No results for input(s): AMMONIA in the last 168 hours. Coagulation profile Recent Labs  Lab 12/10/16 0942 12/11/16 0834  INR 3.00 2.70    CBC: Recent Labs  Lab 12/10/16 0942 12/11/16 0324  WBC 7.0 4.9  NEUTROABS 5.6  --   HGB 12.3 11.9*  HCT 36.0 35.5*  MCV 90.5 90.6  PLT 159 159   Cardiac Enzymes: Recent Labs  Lab 12/10/16 0942 12/10/16 1402 12/10/16 1646 12/10/16 1925  TROPONINI <0.03 <0.03 <0.03 <0.03   BNP: Invalid input(s): POCBNP CBG: No results for input(s): GLUCAP in the last 168 hours. D-Dimer No results for input(s): DDIMER in the last 72 hours. Hgb A1c No results for input(s): HGBA1C in the last 72 hours. Lipid Profile Recent Labs    12/10/16 1402  CHOL 106  HDL 38*  LDLCALC 55  TRIG 64  CHOLHDL 2.8   Thyroid function studies No results for input(s): TSH, T4TOTAL, T3FREE,  THYROIDAB in the last 72 hours.  Invalid input(s): FREET3 Anemia work up No results for input(s): VITAMINB12, FOLATE, FERRITIN, TIBC, IRON, RETICCTPCT in the last 72 hours. Microbiology No results found for this or any previous visit (from the past 240 hour(s)).   Discharge Instructions:   Discharge Instructions    Diet - low sodium heart healthy   Complete by:  As directed    Increase activity slowly   Complete by:  As directed      Allergies as of 12/11/2016      Reactions   Darifenacin Hydrobromide    REACTION: causes her dizziness   Sulfonamide Derivatives    Tramadol Other (See Comments)   Insomnia, anorexia      Medication List    STOP taking these medications   hydrochlorothiazide 12.5 MG capsule Commonly known as:  MICROZIDE     TAKE these medications   ALPRAZolam 0.25 MG tablet Commonly known as:  XANAX TAKE 1/2-1 TABLET BY MOUTH TWICE DAILY AS  NEEDED   atorvastatin 10 MG tablet Commonly known as:  LIPITOR TAKE 1 TABLET BY MOUTH DAILY AT 6 PM. What changed:  See the new instructions.   metoprolol tartrate 25 MG tablet Commonly known as:  LOPRESSOR Take 1 tablet (25 mg total) by mouth 1 day or 1 dose for 1 dose.   pantoprazole 40 MG tablet Commonly known as:  PROTONIX TAKE 1 TABLET (40 MG TOTAL) BY MOUTH DAILY.   PROBIOTIC DIGESTIVE SUPPORT PO Take 1 capsule by mouth daily.   ramipril 10 MG capsule Commonly known as:  ALTACE TAKE 1 CAPSULE (10 MG TOTAL) BY MOUTH DAILY.   ranitidine 150 MG tablet Commonly known as:  ZANTAC Take 1 tablet (150 mg total) by mouth at bedtime.   Vitamin B-12 500 MCG Subl Place 1 tablet (500 mcg total) under the tongue daily at 2 PM.   Vitamin D 2000 units Caps Take 2,000 Units by mouth every other day.   warfarin 2.5 MG tablet Commonly known as:  COUMADIN Take as directed. If you are unsure how to take this medication, talk to your nurse or doctor. Original instructions:  TAKE AS DIRECTED BY ANTICOAGULATION  CLINIC. A 30 DAY SUPPLY      Follow-up Information    Mosie Lukes, MD Follow up in 1 week(s).   Specialty:  Family Medicine Contact information: [address] [address] [place] [address] [phone]        [name], MD. Schedule an appointment as soon as possible for a visit.   Specialty:  Gastroenterology Why:  for need for EGD/botox again Contact information: [place] [address] [phone]        Dorothy Spark, MD Follow up in 1 month(s).   Specialty:  Cardiology Contact information: [place] [address] [phone]            Time coordinating discharge: 35 min  Signed:  Geradine [name]   [facility] 12/11/2016, 9:27 AM

## 2016-12-11 NOTE — Progress Notes (Signed)
Pt slept well during the night, Vitals stable, no any sign of SOB and distress noted, no any complain of pain, will continue to monitor the patient. 

## 2016-12-11 NOTE — Progress Notes (Signed)
Pt has a pause for less than a 2.18 sec at 8.30pm, pt was stable and asymptomatic, vitals stable, MD notified, will continue to monitor

## 2016-12-19 ENCOUNTER — Encounter: Payer: Self-pay | Admitting: Family Medicine

## 2016-12-19 ENCOUNTER — Ambulatory Visit (INDEPENDENT_AMBULATORY_CARE_PROVIDER_SITE_OTHER): Payer: Medicare Other | Admitting: Family Medicine

## 2016-12-19 VITALS — BP 138/70 | HR 75 | Temp 98.0°F | Ht 59.0 in | Wt 103.5 lb

## 2016-12-19 DIAGNOSIS — R0789 Other chest pain: Secondary | ICD-10-CM

## 2016-12-19 DIAGNOSIS — R1310 Dysphagia, unspecified: Secondary | ICD-10-CM

## 2016-12-19 DIAGNOSIS — E876 Hypokalemia: Secondary | ICD-10-CM

## 2016-12-19 LAB — BASIC METABOLIC PANEL
BUN: 13 mg/dL (ref 6–23)
CALCIUM: 8.9 mg/dL (ref 8.4–10.5)
CO2: 33 meq/L — AB (ref 19–32)
Chloride: 104 mEq/L (ref 96–112)
Creatinine, Ser: 1.04 mg/dL (ref 0.40–1.20)
GFR: 52.8 mL/min — AB (ref >60.00)
Glucose, Bld: 101 mg/dL — ABNORMAL HIGH (ref 70–99)
POTASSIUM: 3.9 meq/L (ref 3.5–5.1)
SODIUM: 142 meq/L (ref 135–145)

## 2016-12-19 MED ORDER — ALPRAZOLAM 0.25 MG PO TABS: ORAL_TABLET | ORAL | 0 refills | Status: DC

## 2016-12-19 NOTE — Progress Notes (Signed)
Chief Complaint  Patient presents with  . Hospitalization Follow-up    HPI [name] [name] is a 83 y.o. y.o. female who presents for a transition of care visit.  Pt was discharged from [facility] on 12/11/16.  Within 48 business hours of discharge our office contacted him via telephone to coordinate his care and needs.   Admitted for concerns for cardiac chest pain. On inpatient side, felt to be more related to GI etiology, pt refused workup while inpatient. She is feeling much better and has not had CP since D/C. Still having difficulty swallowing, but this is a chronic issue. She has appt with GI on 1/31. She does OK with breakfast and lunch, difficulty with both solids and liquids in evening. HCTZ d/c'd in hospital, potassium low on d/c, no supp given for home.   Social History   Socioeconomic History  . Marital status: Widowed  Occupational History  . Occupation: retired    Fish farm manager: RETIRED  Tobacco Use  . Smoking status: Never Smoker  . Smokeless tobacco: Never Used  Substance and Sexual Activity  . Alcohol use: No  . Drug use: No   Past Medical History:  Diagnosis Date  . Anemia   . Anxiety   . Arthritis    hips, knees  . Atrial fibrillation ([place])   . Current use of long term anticoagulation   . Diverticulitis of colon 02/06/2015  . Diverticulosis   . Gait difficulty   . GERD (gastroesophageal reflux disease)   . Headache(784.0) 06/22/2012  . Hyperlipidemia   . Hypertension   . IBS (irritable bowel syndrome)   . Incontinence   . Loss of weight 06/05/2015  . Medicare annual wellness visit, subsequent 02/05/2014  . Melena   . Mitral and aortic regurgitation   . Mitral valve prolapse    hx of  . Osteopenia   . Pedal edema 10/01/2016  . Personal history of colonic polyps   . Renal lithiasis 06/26/2015  . Thyroid disease    hypo  . Unspecified adverse effect of other drug, medicinal and biological substance(995.29)   . Vitamin B12 deficiency 03/16/2016  . Vitamin D deficiency      Family History  Problem Relation Age of Onset  . Diabetes Mother   . CVA Mother   . Stroke Mother   . COPD Father   . Rheum arthritis Father   . Allergies Daughter   . COPD Daughter   . Stroke Daughter   . COPD Daughter        previous smoker  . Stroke Maternal Grandfather   . Heart disease Maternal Aunt   . Heart disease Maternal Uncle   . Colon cancer Neg Hx   . Colon polyps Neg Hx   . Esophageal cancer Neg Hx   . Gallbladder disease Neg Hx   . Kidney disease Neg Hx   . Heart attack Neg Hx   . Hypertension Neg Hx    Allergies as of 12/19/2016      Reactions   Darifenacin Hydrobromide    REACTION: causes her dizziness   Sulfonamide Derivatives    Tramadol Other (See Comments)   Insomnia, anorexia      Medication List        Accurate as of 12/19/16 11:45 AM. Always use your most recent med list.          ALPRAZolam 0.25 MG tablet Commonly known as:  XANAX TAKE 1/2-1 TABLET BY MOUTH TWICE DAILY AS NEEDED   atorvastatin 10 MG  tablet Commonly known as:  LIPITOR TAKE 1 TABLET BY MOUTH DAILY AT 6 PM.   metoprolol tartrate 25 MG tablet Commonly known as:  LOPRESSOR TAKE 1 TABLET BY MOUTH TWICE A DAY ((CAN FILL 7-28))   metoprolol tartrate 25 MG tablet Commonly known as:  LOPRESSOR Take 1 tablet (25 mg total) by mouth 1 day or 1 dose for 1 dose.   pantoprazole 40 MG tablet Commonly known as:  PROTONIX TAKE 1 TABLET (40 MG TOTAL) BY MOUTH DAILY.   PROBIOTIC DIGESTIVE SUPPORT PO Take 1 capsule by mouth daily.   ramipril 10 MG capsule Commonly known as:  ALTACE TAKE 1 CAPSULE (10 MG TOTAL) BY MOUTH DAILY.   ranitidine 150 MG tablet Commonly known as:  ZANTAC Take 1 tablet (150 mg total) by mouth at bedtime.   Vitamin B-12 500 MCG Subl Place 1 tablet (500 mcg total) under the tongue daily at 2 PM.   Vitamin D 2000 units Caps Take 2,000 Units by mouth every other day.   warfarin 2.5 MG tablet Commonly known as:  COUMADIN Take as directed by the  anticoagulation clinic. If you are unsure how to take this medication, talk to your nurse or doctor. Original instructions:  TAKE AS DIRECTED BY ANTICOAGULATION CLINIC. A 30 DAY SUPPLY       ROS:  Constitutional: No fevers or chills, no weight loss HEENT: No headaches, hearing loss, or runny nose, no sore throat Heart: No chest pain, +intermitten palpitations. Lungs: No SOB, no cough Abd: As noted in HPI GU: No urinary complaints Neuro: No numbness, tingling or weakness Msk: No new joint or muscle pain  Objective BP 138/70 (BP Location: Left Arm, Patient Position: Sitting, Cuff Size: Normal)   Pulse 75   Temp 98 F (36.7 C) (Oral)   Ht 4\' 11"  (1.499 m)   Wt 103 lb 8 oz (46.9 kg)   SpO2 97%   BMI 20.90 kg/m  General Appearance:  awake, alert, oriented, in no acute distress and well developed, well nourished Skin:  there are no suspicious lesions or rashes of concern Head/face:  NCAT Eyes:  EOMI, PERRLA Ears:  canals and TMs NI Nose/Sinuses:  negative Mouth/Throat:  Mucosa moist, no lesions; pharynx without erythema, edema or exudate. Neck:  neck- supple, no mass, non-tender and no jvd Lungs: Clear to auscultation.  No rales, rhonchi, or wheezing. Normal effort, no accessory muscle use. Heart:  Heart sounds are normal.  Regular rate and rhythm, no le edema Abdomen:  BS+, soft, NT, ND, no masses or organomegaly Musculoskeletal:  No muscle group atrophy or asymmetry, gait slow Neurologic:  Alert and oriented x 3, gait normal., reflexes normal and symmetric, strength and sensation grossly normal Psych exam: Nml mood and affect, age appropriate judgment and insight  Atypical chest pain  Dysphagia, unspecified type  Hypokalemia - Plan: Basic metabolic panel  Discharge summary and medication list have been reviewed/reconciled.  Labs pending at the time of discharge have been reviewed or are still pending at the time of this visit.  Follow-up labs and appointments have been  ordered and/or coordinated appropriately. Educational materials regarding the patient's admitting diagnosis provided.  TRANSITIONAL CARE MANAGEMENT CERTIFICATION:  I certify the following are true:   1. Communication with the patient/care giver was made within 2 business days of discharge.  2. Complexity of Medical decision making is moderate.  3. Face to face visit occurred within 14 days of discharge.   Offered to change PPI, but pt would like  to wait for Dr. Loletha Carrow' recs. Let me know if she changes mind.  I will refill Xanax on behalf of reg pcp.  F/u as originally scheduled w reg pcp. The patient voiced understanding and agreement to the plan.  [place], DO 12/19/16 11:45 AM

## 2016-12-19 NOTE — Progress Notes (Signed)
Pre visit review using our clinic review tool, if applicable. No additional management support is needed unless otherwise documented below in the visit note. 

## 2016-12-19 NOTE — Patient Instructions (Signed)
If you change your mind about your GERD medicine, let me know.  Give [place] 2-3 business days to get the results of your labs back. If labs are normal, you will likely receive a letter in the mail unless you have MyChart. This can take longer than 2-3 business days.   Let us know if you need anything.

## 2016-12-21 ENCOUNTER — Other Ambulatory Visit: Payer: Self-pay

## 2016-12-21 MED ORDER — RANITIDINE HCL 150 MG PO TABS: 150.0000 mg | ORAL_TABLET | Freq: Every day | ORAL | 3 refills | Status: DC

## 2017-01-02 ENCOUNTER — Ambulatory Visit (INDEPENDENT_AMBULATORY_CARE_PROVIDER_SITE_OTHER): Payer: Medicare Other

## 2017-01-02 DIAGNOSIS — I4891 Unspecified atrial fibrillation: Secondary | ICD-10-CM

## 2017-01-02 DIAGNOSIS — Z5181 Encounter for therapeutic drug level monitoring: Secondary | ICD-10-CM | POA: Diagnosis not present

## 2017-01-02 LAB — POCT INR: INR: 2.6

## 2017-01-02 NOTE — Patient Instructions (Signed)
Continue on same dosage 1 tablet daily except 1.5 tablets on Mondays. Recheck INR in 6 weeks. Call our office if you are placed on any new medications or if you have any concerns [phone].

## 2017-01-08 ENCOUNTER — Other Ambulatory Visit: Payer: Self-pay | Admitting: Cardiology

## 2017-01-13 ENCOUNTER — Other Ambulatory Visit: Payer: Self-pay | Admitting: Family Medicine

## 2017-01-15 ENCOUNTER — Ambulatory Visit: Payer: Medicare Other | Admitting: Family Medicine

## 2017-01-21 ENCOUNTER — Other Ambulatory Visit: Payer: Self-pay | Admitting: Family Medicine

## 2017-01-30 NOTE — Progress Notes (Signed)
[facility] Consult Note:  History: [name] [name] 01/31/2017  Referring physician: Mosie Lukes, MD  Reason for consult/chief complaint: Gastroesophageal Reflux (worse at night) and Dysphagia (solids and liquids)   Subjective  HPI:  Last seen by me for diverticulitis in 03/2015  This is a 84 year old woman referred back by primary care after December 2018 hospitalization for chest pain that was felt to be atypical for cardiac cause. There aas some suggestion it may have been GI in nature.  She has a long-standing history of dysphagia and carries a diagnosis of achalasia made by Dr. Deatra [name], seen by him most recently in 2014 , and office note indicates a history of achalasia with a Botox injection of the LES in 2011.  Upper endoscopy in 2014 revealed a chronic large distal esophageal diverticulum with retained food, and Botox of the LES was again performed..  By the time of her primary care appointment shortly after discharge, she reported that the  and she was back to her chronic solid food dysphagia that seems to bother her more late in the day. She has atrial fibrillation on oral anticoagulation.  The episode of chest pain she had occurred in the middle of the night, unrelated to eating.  It had not happened in the past that she can recall, and has not recurred since then. She may have episodes of regurgitation during the day and also at night.  Neither she nor her daughter can recall how much improvement  [name] [name] had after the last Botox injection.   ROS:  Review of Systems  No dyspnea or dysuria She believes her weight has been stable Arthralgias Remainder of systems negative except as above  Past Medical History: Past Medical History:  Diagnosis Date  . Anemia   . Anxiety   . Arthritis    hips, knees  . Atrial fibrillation ([place])   . Current use of long term anticoagulation   . Diverticulitis of colon 02/06/2015  . Diverticulosis   . Gait difficulty    . GERD (gastroesophageal reflux disease)   . Headache(784.0) 06/22/2012  . Hyperlipidemia   . Hypertension   . IBS (irritable bowel syndrome)   . Incontinence   . Loss of weight 06/05/2015  . Medicare annual wellness visit, subsequent 02/05/2014  . Melena   . Mitral and aortic regurgitation   . Mitral valve prolapse    hx of  . Osteopenia   . Pedal edema 10/01/2016  . Personal history of colonic polyps   . Renal lithiasis 06/26/2015  . Thyroid disease    hypo  . Unspecified adverse effect of other drug, medicinal and biological substance(995.29)   . Vitamin B12 deficiency 03/16/2016  . Vitamin D deficiency      Past Surgical History: Past Surgical History:  Procedure Laterality Date  . BOTOX INJECTION N/A 09/03/2012   Procedure: BOTOX INJECTION;  Surgeon: [name] Castle, MD;  Location: [facility];  Service: Endoscopy;  Laterality: N/A;  . BREAST BIOPSY Left   . BREAST LUMPECTOMY Right 08/18/2014   Procedure: RIGHT BREAST LUMPECTOMY;  Surgeon: [name] Keens, MD;  Location: [facility];  Service: General;  Laterality: Right;  . CARDIAC ELECTROPHYSIOLOGY MAPPING AND ABLATION    . CATARACT EXTRACTION, BILATERAL    . ESOPHAGOGASTRODUODENOSCOPY N/A 09/03/2012   Procedure: ESOPHAGOGASTRODUODENOSCOPY (EGD);  Surgeon: [name] Castle, MD;  Location: [facility];  Service: Endoscopy;  Laterality: N/A;  . ESOPHAGOSCOPY W/ BOTOX INJECTION    . I&D EXTREMITY Right 11/06/2012  Procedure: IRRIGATION AND DEBRIDEMENT EXTREMITY Right Ring Finger;  Surgeon: [name] Must, MD;  Location: [facility];  Service: Orthopedics;  Laterality: Right;  . PARTIAL HYSTERECTOMY     ovaries left in place     Family History: Family History  Problem Relation Age of Onset  . Diabetes Mother   . CVA Mother   . Stroke Mother   . COPD Father   . Rheum arthritis Father   . Allergies Daughter   . COPD Daughter   . Stroke Daughter   . COPD Daughter        previous smoker  . Stroke Maternal  Grandfather   . Heart disease Maternal Aunt   . Heart disease Maternal Uncle   . Colon cancer Neg Hx   . Colon polyps Neg Hx   . Esophageal cancer Neg Hx   . Gallbladder disease Neg Hx   . Kidney disease Neg Hx   . Heart attack Neg Hx   . Hypertension Neg Hx     Social History: Social History   Socioeconomic History  . Marital status: Widowed    Spouse name: None  . Number of children: 5  . Years of education: None  . Highest education level: None  Social Needs  . Financial resource strain: None  . Food insecurity - worry: None  . Food insecurity - inability: None  . Transportation needs - medical: None  . Transportation needs - non-medical: None  Occupational History  . Occupation: retired    Fish farm manager: RETIRED  Tobacco Use  . Smoking status: Never Smoker  . Smokeless tobacco: Never Used  Substance and Sexual Activity  . Alcohol use: No  . Drug use: No  . Sexual activity: None    Comment: lives with Daughter, grandson and his family. no dietary restricitons.  Other Topics Concern  . None  Social History Narrative  . None    Allergies: Allergies  Allergen Reactions  . Darifenacin Hydrobromide     REACTION: causes her dizziness  . Sulfonamide Derivatives   . Tramadol Other (See Comments)    Insomnia, anorexia    Outpatient Meds: Current Outpatient Medications  Medication Sig Dispense Refill  . ALPRAZolam (XANAX) 0.25 MG tablet TAKE 1/2-1 TABLET BY MOUTH TWICE DAILY AS NEEDED 90 tablet 0  . atorvastatin (LIPITOR) 10 MG tablet TAKE 1 TABLET BY MOUTH DAILY AT 6 PM. (Patient taking differently: TAKE 1 TABLET 10mg   BY MOUTH DAILY AT 6 PM.) 30 tablet 2  . Cholecalciferol (VITAMIN D) 2000 UNITS CAPS Take 2,000 Units by mouth every other day.    . Cyanocobalamin (VITAMIN B-12) 500 MCG SUBL Place 1 tablet (500 mcg total) under the tongue daily at 2 PM. 150 tablet   . metoprolol tartrate (LOPRESSOR) 25 MG tablet TAKE 1 TABLET BY MOUTH TWICE A DAY 180 tablet 1  .  pantoprazole (PROTONIX) 40 MG tablet TAKE 1 TABLET (40 MG TOTAL) BY MOUTH DAILY. 90 tablet 1  . ramipril (ALTACE) 10 MG capsule TAKE 1 CAPSULE (10 MG TOTAL) BY MOUTH DAILY. 90 capsule 0  . ranitidine (ZANTAC) 150 MG tablet Take 1 tablet (150 mg total) by mouth at bedtime. 30 tablet 3  . warfarin (COUMADIN) 2.5 MG tablet TAKE AS DIRECTED BY ANTICOAGULATION CLINIC. A 30 DAY SUPPLY 40 tablet 3   No current facility-administered medications for this visit.       ___________________________________________________________________ Objective   Exam:  BP (!) 144/72   Pulse 74   Ht 4\' 11"  (1.499  m)   Wt 100 lb 6 oz (45.5 kg)   BMI 20.27 kg/m    General: this is a(n) Pleasant elderly woman, not acutely ill appearing.  Accompanied by her daughter.  Eyes: sclera anicteric, no redness  ENT: oral mucosa moist without lesions, no cervical or supraclavicular lymphadenopathy, good dentition  CV: RRR without murmur, S1/S2, no JVD, no peripheral edema  Resp: clear to auscultation bilaterally, normal RR and effort noted  GI: soft, no tenderness, with active bowel sounds. No guarding or palpable organomegaly noted.  Skin; warm and dry, no rash or jaundice noted  Neuro: awake, alert and oriented x 3. Normal gross motor function and fluent speech  Labs:  CBC Latest Ref Rng & Units 12/11/2016 12/10/2016 09/24/2016  WBC 4.0 - 10.5 K/uL 4.9 7.0 4.8  Hemoglobin 12.0 - 15.0 g/dL 11.9(L) 12.3 13.1  Hematocrit 36.0 - 46.0 % 35.5(L) 36.0 39.3  Platelets 150 - 400 K/uL 159 159 177.0     Radiologic Studies:  2011 Barium swallow: There is a large diverticulum in the distal esophagus projecting to the left.  This is estimated to measure 8 cm in diameter and fills with contrast.  There is a stricture in the distal esophagus below the diverticulum.  This appears to be a tight stricture.  A barium tablet was swallowed however when into the diverticulum rather than into the stomach.  The stricture  appears to be a smooth benign stricture which has progressed in severity since the prior study.   There is esophageal dysmotility.  There is a moderately large sliding hiatal hernia.  No reflux was identified.  2008 EGD report reveals large distal esophageal diverticulum as well as resistance passing scope with a reported stricture at the EG junction, dilation to 18 mm balloon was performed.  Assessment: Encounter Diagnoses  Name Primary?  . Esophageal dysphagia Yes  . Esophageal diverticulum   . Achalasia   . Other chest pain   . Persistent atrial fibrillation ([place])   . Long term (current) use of anticoagulants     It is very difficult to tell if the chest pain that brought her to the hospital was GI related.  She seems to have underlying achalasia, it is uncertain how much improvement in symptoms she had from Botox, perhaps because she also has a large distal esophageal diverticulum contributing to dysphagia.  I do not think she has GERD per se so much as she has regurgitation of contents that are hung up in the esophagus.  Therefore, I doubt she would get improvement on increased dosing of PPI.  We discussed the possibility of an upper endoscopy with Botox injection.  He would be an increased risk procedure due to her age and cardiac condition.  It would also likely be a more challenging procedure than usual due to the esophageal tortuosity and motility disorder as well as a large diverticulum.  In the end, she decided not to have upper endoscopy at this point.  She feels like she can manage as things are now she is keeping weight on.  If this changes significantly and she wishes to pursue the upper endoscopy, we would have to keep her off Coumadin several days prior which she understands presents a low but real risk of stroke.  We would need clearance from her cardiologist before doing so.  Therefore, I will wait to hear from her as needed.  Total 30-minute time, over half spent in  record review and discussion with [place] and her daughter  about the issues described above.  Thank you for the courtesy of this consult.  Please call me with any questions or concerns.  Nelida Meuse III  CC: [name] Lukes, MD

## 2017-01-31 ENCOUNTER — Ambulatory Visit: Payer: Medicare Other | Admitting: Gastroenterology

## 2017-01-31 ENCOUNTER — Encounter: Payer: Self-pay | Admitting: Cardiology

## 2017-01-31 ENCOUNTER — Encounter (INDEPENDENT_AMBULATORY_CARE_PROVIDER_SITE_OTHER): Payer: Self-pay

## 2017-01-31 VITALS — BP 144/72 | HR 74 | Ht 59.0 in | Wt 100.4 lb

## 2017-01-31 DIAGNOSIS — R1319 Other dysphagia: Secondary | ICD-10-CM

## 2017-01-31 DIAGNOSIS — R1310 Dysphagia, unspecified: Secondary | ICD-10-CM | POA: Diagnosis not present

## 2017-01-31 DIAGNOSIS — R0789 Other chest pain: Secondary | ICD-10-CM

## 2017-01-31 DIAGNOSIS — Q396 Congenital diverticulum of esophagus: Secondary | ICD-10-CM

## 2017-01-31 DIAGNOSIS — K220 Achalasia of cardia: Secondary | ICD-10-CM

## 2017-01-31 DIAGNOSIS — Z7901 Long term (current) use of anticoagulants: Secondary | ICD-10-CM

## 2017-01-31 DIAGNOSIS — I4819 Other persistent atrial fibrillation: Secondary | ICD-10-CM

## 2017-01-31 DIAGNOSIS — I481 Persistent atrial fibrillation: Secondary | ICD-10-CM

## 2017-01-31 NOTE — Patient Instructions (Signed)
Please follow up as needed 

## 2017-02-01 ENCOUNTER — Telehealth: Payer: Self-pay | Admitting: Family Medicine

## 2017-02-01 ENCOUNTER — Ambulatory Visit: Payer: Medicare Other | Admitting: Family Medicine

## 2017-02-01 VITALS — BP 118/68 | HR 59 | Temp 97.9°F | Resp 18 | Wt 106.0 lb

## 2017-02-01 DIAGNOSIS — K222 Esophageal obstruction: Secondary | ICD-10-CM | POA: Diagnosis not present

## 2017-02-01 DIAGNOSIS — D649 Anemia, unspecified: Secondary | ICD-10-CM | POA: Diagnosis not present

## 2017-02-01 DIAGNOSIS — R300 Dysuria: Secondary | ICD-10-CM

## 2017-02-01 DIAGNOSIS — I10 Essential (primary) hypertension: Secondary | ICD-10-CM | POA: Diagnosis not present

## 2017-02-01 LAB — CBC
HCT: 35.5 % — ABNORMAL LOW (ref 36.0–46.0)
Hemoglobin: 11.8 g/dL — ABNORMAL LOW (ref 12.0–15.0)
MCHC: 33.2 g/dL (ref 30.0–36.0)
MCV: 92.6 fl (ref 78.0–100.0)
PLATELETS: 173 10*3/uL (ref 150.0–400.0)
RBC: 3.83 Mil/uL — ABNORMAL LOW (ref 3.87–5.11)
RDW: 14.5 % (ref 11.5–15.5)
WBC: 5.6 10*3/uL (ref 4.0–10.5)

## 2017-02-01 LAB — POC URINALSYSI DIPSTICK (AUTOMATED)
BILIRUBIN UA: NEGATIVE
Blood, UA: POSITIVE
Glucose, UA: NEGATIVE
Ketones, UA: NEGATIVE
Nitrite, UA: NEGATIVE
PH UA: 6 (ref 5.0–8.0)
Protein, UA: 15
Spec Grav, UA: 1.015 (ref 1.010–1.025)
Urobilinogen, UA: 0.2 E.U./dL

## 2017-02-01 MED ORDER — AMOXICILLIN 500 MG PO CAPS: 500.0000 mg | ORAL_CAPSULE | Freq: Three times a day (TID) | ORAL | 0 refills | Status: DC

## 2017-02-01 MED ORDER — RANITIDINE HCL 150 MG PO CAPS: 150.0000 mg | ORAL_CAPSULE | Freq: Two times a day (BID) | ORAL | 1 refills | Status: DC

## 2017-02-01 MED ORDER — RANITIDINE HCL 300 MG PO CAPS: 300.0000 mg | ORAL_CAPSULE | Freq: Every evening | ORAL | 3 refills | Status: DC

## 2017-02-01 NOTE — Patient Instructions (Signed)

## 2017-02-01 NOTE — Progress Notes (Signed)
Subjective:  I acted as a Education administrator for Dr. Charlett Blake. [place], [place]  Patient ID: [name] [name], female    DOB: [birth year], 84 y.o.   MRN: [id number]  No chief complaint on file.   HPI  Patient is in today for a follow up on chronic medical concerns including hypertension and heartburn and overall she is doing well. She does note she has had some dysuria and urgency recently. She notes her epigastrium  No changes in bowel habits or blood in stool. .sny   Patient Care Team: Mosie Lukes, MD as PCP - General (Family Medicine)   Past Medical History:  Diagnosis Date  . Anemia   . Anxiety   . Arthritis    hips, knees  . Atrial fibrillation ([place])   . Current use of long term anticoagulation   . Diverticulitis of colon 02/06/2015  . Diverticulosis   . Gait difficulty   . GERD (gastroesophageal reflux disease)   . Headache(784.0) 06/22/2012  . Hyperlipidemia   . Hypertension   . IBS (irritable bowel syndrome)   . Incontinence   . Loss of weight 06/05/2015  . Medicare annual wellness visit, subsequent 02/05/2014  . Melena   . Mitral and aortic regurgitation   . Mitral valve prolapse    hx of  . Osteopenia   . Pedal edema 10/01/2016  . Personal history of colonic polyps   . Renal lithiasis 06/26/2015  . Thyroid disease    hypo  . Unspecified adverse effect of other drug, medicinal and biological substance(995.29)   . Vitamin B12 deficiency 03/16/2016  . Vitamin D deficiency     Past Surgical History:  Procedure Laterality Date  . BOTOX INJECTION N/A 09/03/2012   Procedure: BOTOX INJECTION;  Surgeon: Inda Castle, MD;  [facility];  Service: Endoscopy;  Laterality: N/A;  . BREAST BIOPSY Left   . BREAST LUMPECTOMY Right 08/18/2014   Procedure: RIGHT BREAST LUMPECTOMY;  Surgeon: Coralie Keens, MD;  Location: [facility];  Service: General;  Laterality: Right;  . CARDIAC ELECTROPHYSIOLOGY MAPPING AND ABLATION    . CATARACT EXTRACTION, BILATERAL    .  ESOPHAGOGASTRODUODENOSCOPY N/A 09/03/2012   Procedure: ESOPHAGOGASTRODUODENOSCOPY (EGD);  Surgeon: Inda Castle, MD;  Location: [facility];  Service: Endoscopy;  Laterality: N/A;  . ESOPHAGOSCOPY W/ BOTOX INJECTION    . I&D EXTREMITY Right 11/06/2012   Procedure: IRRIGATION AND DEBRIDEMENT EXTREMITY Right Ring Finger;  Surgeon: Tennis Must, MD;  Location: [facility];  Service: Orthopedics;  Laterality: Right;  . PARTIAL HYSTERECTOMY     ovaries left in place    Family History  Problem Relation Age of Onset  . Diabetes Mother   . CVA Mother   . Stroke Mother   . COPD Father   . Rheum arthritis Father   . Allergies Daughter   . COPD Daughter   . Stroke Daughter   . COPD Daughter        previous smoker  . Stroke Maternal Grandfather   . Heart disease Maternal Aunt   . Heart disease Maternal Uncle   . Colon cancer Neg Hx   . Colon polyps Neg Hx   . Esophageal cancer Neg Hx   . Gallbladder disease Neg Hx   . Kidney disease Neg Hx   . Heart attack Neg Hx   . Hypertension Neg Hx     Social History   Socioeconomic History  . Marital status: Widowed    Spouse name: Not on file  .  Number of children: 5  . Years of education: Not on file  . Highest education level: Not on file  Social Needs  . Financial resource strain: Not on file  . Food insecurity - worry: Not on file  . Food insecurity - inability: Not on file  . Transportation needs - medical: Not on file  . Transportation needs - non-medical: Not on file  Occupational History  . Occupation: retired    Fish farm manager: RETIRED  Tobacco Use  . Smoking status: Never Smoker  . Smokeless tobacco: Never Used  Substance and Sexual Activity  . Alcohol use: No  . Drug use: No  . Sexual activity: Not on file    Comment: lives with Daughter, grandson and his family. no dietary restricitons.  Other Topics Concern  . Not on file  Social History Narrative  . Not on file    Outpatient Medications Prior to Visit  Medication Sig  Dispense Refill  . ALPRAZolam (XANAX) 0.25 MG tablet TAKE 1/2-1 TABLET BY MOUTH TWICE DAILY AS NEEDED 90 tablet 0  . atorvastatin (LIPITOR) 10 MG tablet TAKE 1 TABLET BY MOUTH DAILY AT 6 PM. (Patient taking differently: TAKE 1 TABLET 10mg   BY MOUTH DAILY AT 6 PM.) 30 tablet 2  . Cholecalciferol (VITAMIN D) 2000 UNITS CAPS Take 2,000 Units by mouth every other day.    . Cyanocobalamin (VITAMIN B-12) 500 MCG SUBL Place 1 tablet (500 mcg total) under the tongue daily at 2 PM. 150 tablet   . metoprolol tartrate (LOPRESSOR) 25 MG tablet TAKE 1 TABLET BY MOUTH TWICE A DAY 180 tablet 1  . pantoprazole (PROTONIX) 40 MG tablet TAKE 1 TABLET (40 MG TOTAL) BY MOUTH DAILY. 90 tablet 1  . ramipril (ALTACE) 10 MG capsule TAKE 1 CAPSULE (10 MG TOTAL) BY MOUTH DAILY. 90 capsule 0  . warfarin (COUMADIN) 2.5 MG tablet TAKE AS DIRECTED BY ANTICOAGULATION CLINIC. A 30 DAY SUPPLY 40 tablet 3  . ranitidine (ZANTAC) 150 MG tablet Take 1 tablet (150 mg total) by mouth at bedtime. 30 tablet 3   No facility-administered medications prior to visit.     Allergies  Allergen Reactions  . Darifenacin Hydrobromide     REACTION: causes her dizziness  . Sulfonamide Derivatives   . Tramadol Other (See Comments)    Insomnia, anorexia    Review of Systems  Constitutional: Negative for fever and malaise/fatigue.  HENT: Negative for congestion.   Eyes: Negative for blurred vision.  Respiratory: Negative for cough and shortness of breath.   Cardiovascular: Negative for chest pain, palpitations and leg swelling.  Gastrointestinal: Negative for vomiting.  Genitourinary: Positive for dysuria and frequency.  Musculoskeletal: Negative for back pain.  Skin: Negative for rash.  Neurological: Negative for loss of consciousness and headaches.       Objective:    Physical Exam  Constitutional: She is oriented to person, place, and time. She appears well-developed and well-nourished. No distress.  HENT:  Head: Normocephalic  and atraumatic.  Eyes: Conjunctivae are normal.  Neck: Normal range of motion. No thyromegaly present.  Cardiovascular: Normal rate and regular rhythm.  Pulmonary/Chest: Effort normal and breath sounds normal. She has no wheezes.  Abdominal: Soft. Bowel sounds are normal. There is no tenderness.  Musculoskeletal: Normal range of motion. She exhibits no edema or deformity.  Neurological: She is alert and oriented to person, place, and time.  Skin: Skin is warm and dry. She is not diaphoretic.  Psychiatric: She has a normal mood and affect.  BP 118/68 (BP Location: Left Arm, Patient Position: Sitting, Cuff Size: Normal)   Pulse (!) 59   Temp 97.9 F (36.6 C) (Oral)   Resp 18   Wt 106 lb (48.1 kg)   SpO2 97%   BMI 21.41 kg/m  Wt Readings from Last 3 Encounters:  02/01/17 106 lb (48.1 kg)  01/31/17 100 lb 6 oz (45.5 kg)  12/19/16 103 lb 8 oz (46.9 kg)   BP Readings from Last 3 Encounters:  02/01/17 118/68  01/31/17 (!) 144/72  12/19/16 138/70     Immunization History  Administered Date(s) Administered  . Influenza Whole 09/18/2007, 10/02/2010, 09/15/2012  . Influenza, High Dose Seasonal PF 10/01/2016  . Influenza,inj,Quad PF,6+ Mos 08/24/2013, 08/31/2014  . Pneumococcal Conjugate-13 08/31/2014  . Pneumococcal Polysaccharide-23 02/20/2006  . Tdap 10/30/2010    Health Maintenance  Topic Date Due  . Samul Dada  10/29/2020  . INFLUENZA VACCINE  Completed  . DEXA SCAN  Completed  . PNA vac Low Risk Adult  Completed    Lab Results  Component Value Date   WBC 5.6 02/01/2017   HGB 11.8 (L) 02/01/2017   HCT 35.5 (L) 02/01/2017   PLT 173.0 02/01/2017   GLUCOSE 101 (H) 12/19/2016   CHOL 106 12/10/2016   TRIG 64 12/10/2016   HDL 38 (L) 12/10/2016   LDLDIRECT 72.1 01/19/2009   LDLCALC 55 12/10/2016   ALT 13 (L) 12/11/2016   AST 19 12/11/2016   NA 142 12/19/2016   K 3.9 12/19/2016   CL 104 12/19/2016   CREATININE 1.04 12/19/2016   BUN 13 12/19/2016   CO2 33 (H)  12/19/2016   TSH 2.61 11/01/2016   INR 2.6 01/02/2017   HGBA1C 5.7 09/24/2016    Lab Results  Component Value Date   TSH 2.61 11/01/2016   Lab Results  Component Value Date   WBC 5.6 02/01/2017   HGB 11.8 (L) 02/01/2017   HCT 35.5 (L) 02/01/2017   MCV 92.6 02/01/2017   PLT 173.0 02/01/2017   Lab Results  Component Value Date   NA 142 12/19/2016   K 3.9 12/19/2016   CO2 33 (H) 12/19/2016   GLUCOSE 101 (H) 12/19/2016   BUN 13 12/19/2016   CREATININE 1.04 12/19/2016   BILITOT 0.5 12/11/2016   ALKPHOS 12 (L) 12/11/2016   AST 19 12/11/2016   ALT 13 (L) 12/11/2016   PROT 6.1 (L) 12/11/2016   ALBUMIN 3.1 (L) 12/11/2016   CALCIUM 8.9 12/19/2016   ANIONGAP 10 12/11/2016   GFR 52.80 (L) 12/19/2016   Lab Results  Component Value Date   CHOL 106 12/10/2016   Lab Results  Component Value Date   HDL 38 (L) 12/10/2016   Lab Results  Component Value Date   LDLCALC 55 12/10/2016   Lab Results  Component Value Date   TRIG 64 12/10/2016   Lab Results  Component Value Date   CHOLHDL 2.8 12/10/2016   Lab Results  Component Value Date   HGBA1C 5.7 09/24/2016         Assessment & Plan:   Problem List Items Addressed This Visit    Anemia   Relevant Orders   CBC (Completed)   Essential hypertension    Well controlled, no changes to meds. Encouraged heart healthy diet such as the DASH diet and exercise as tolerated.       ESOPHAGEAL STRICTURE    Has chosen not to have a dilatation. She eats small bites, chews well and follows with a sip of liquid  with good results most days. Will consider GI consult if worsens. Increase Ranitidine to 300 mg po qhs prn       Other Visit Diagnoses    Dysuria    -  Primary   Relevant Orders   POCT Urinalysis Dipstick (Automated) (Completed)   Urine Culture (Completed)      I have discontinued Lynnett P. Cogan's ranitidine. I am also having her start on amoxicillin. Additionally, I am having her maintain her Vitamin D, Vitamin  B-12, pantoprazole, warfarin, atorvastatin, ALPRAZolam, metoprolol tartrate, and ramipril.  Meds ordered this encounter  Medications  . DISCONTD: ranitidine (ZANTAC) 300 MG capsule    Sig: Take 1 capsule (300 mg total) by mouth every evening.    Dispense:  30 capsule    Refill:  3  . amoxicillin (AMOXIL) 500 MG capsule    Sig: Take 1 capsule (500 mg total) by mouth 3 (three) times daily.    Dispense:  15 capsule    Refill:  0    CMA served as scribe during this visit. History, Physical and Plan performed by medical provider. Documentation and orders reviewed and attested to.  Penni Homans, MD

## 2017-02-01 NOTE — Telephone Encounter (Signed)
Pt.'s daughter reports the Zantac 300 mg cost $67/ month and pt cannot afford that. Please advise.

## 2017-02-01 NOTE — Telephone Encounter (Signed)
Copied from [place] [id number]. Topic: Quick Communication - See Telephone Encounter >> Feb 01, 2017  1:23 PM Corie Chiquito, [place] wrote: CRM for notification. Patients daughter calling because her mothers Ranitidine 300mg  medication is to much money, so they would like to know if she could have a different brand of the medication or could she stay on the Ranitidine but change the dose back to 150mg  and take the meds 2 times a day instead of 1 time a day like she is taking now. She would still like the new medication to be sent to [facility] [place] [phone]

## 2017-02-01 NOTE — Telephone Encounter (Signed)
Sent the original rx to pharmacy

## 2017-02-02 LAB — URINE CULTURE
MICRO NUMBER: 90139904
RESULT: NO GROWTH
SPECIMEN QUALITY:: ADEQUATE

## 2017-02-03 NOTE — Assessment & Plan Note (Signed)
Has chosen not to have a dilatation. She eats small bites, chews well and follows with a sip of liquid with good results most days. Will consider GI consult if worsens. Increase Ranitidine to 300 mg po qhs prn

## 2017-02-03 NOTE — Assessment & Plan Note (Signed)
Well controlled, no changes to meds. Encouraged heart healthy diet such as the DASH diet and exercise as tolerated.  °

## 2017-02-08 ENCOUNTER — Ambulatory Visit: Payer: Medicare Other | Admitting: Cardiology

## 2017-02-08 ENCOUNTER — Ambulatory Visit (INDEPENDENT_AMBULATORY_CARE_PROVIDER_SITE_OTHER): Payer: Medicare Other | Admitting: *Deleted

## 2017-02-08 ENCOUNTER — Encounter: Payer: Self-pay | Admitting: Cardiology

## 2017-02-08 VITALS — BP 150/70 | HR 50 | Ht 59.0 in | Wt 99.0 lb

## 2017-02-08 DIAGNOSIS — I480 Paroxysmal atrial fibrillation: Secondary | ICD-10-CM | POA: Diagnosis not present

## 2017-02-08 DIAGNOSIS — I10 Essential (primary) hypertension: Secondary | ICD-10-CM | POA: Diagnosis not present

## 2017-02-08 DIAGNOSIS — Z7901 Long term (current) use of anticoagulants: Secondary | ICD-10-CM

## 2017-02-08 DIAGNOSIS — Z5181 Encounter for therapeutic drug level monitoring: Secondary | ICD-10-CM | POA: Diagnosis not present

## 2017-02-08 DIAGNOSIS — E782 Mixed hyperlipidemia: Secondary | ICD-10-CM | POA: Diagnosis not present

## 2017-02-08 DIAGNOSIS — R600 Localized edema: Secondary | ICD-10-CM | POA: Diagnosis not present

## 2017-02-08 DIAGNOSIS — I4891 Unspecified atrial fibrillation: Secondary | ICD-10-CM

## 2017-02-08 LAB — POCT INR: INR: 1.7

## 2017-02-08 MED ORDER — FUROSEMIDE 20 MG PO TABS: 20.0000 mg | ORAL_TABLET | Freq: Every day | ORAL | 6 refills | Status: DC | PRN

## 2017-02-08 NOTE — Patient Instructions (Addendum)
Medication Instructions:  1) START LASIX 20 mg daily as needed  2) STOP LIPITOR  Labwork: None  Testing/Procedures: None  Follow-Up: You have an appointment scheduled with Dr. [name] on Wednesday, June 12 at 10:20AM.  Any Other Special Instructions Will Be Listed Below (If Applicable).     If you need a refill on your cardiac medications before your next appointment, please call your pharmacy.

## 2017-02-08 NOTE — Progress Notes (Signed)
02/08/2017 [name] [name]   1924-04-01  [id number]  Primary Physician Mosie Lukes, MD Primary Cardiologist: Dr. [name]    Reason for Visit/CC: F/u for Atrial Fibrillation   HPI:  [name] [name] is a 84 y.o. female who is being seen today for f/u for chronic atrial fibrillation. She is followed by Dr. [name] and is on chronic a/c with coumadin. Her INRs are followed in our coumadin clinic. She also has a h/o mild AI and mild MR w/ MVP. Her last 2D echo in 2014 showed normal LVEF at 55-60%. She also has a h/o treated HTN and HLD. Her last OV with Dr. [name] was 11/2016. She was stable and instructed to f/u in 1 year.  She presents to clinic a few months early. She has concerns regarding increase in HR and more frequent palpitations. She is currently asymptomatic. HF in the mid 70s. She reports full compliance with metoprolol but is on low dose, 12.5 mg BID. Her BP is elevated at 160/80. She avoids caffeine. No ETOH. She admits that she does not drink plenty of fluids.   2/8/9 - the patient is coming after 9 months, she looks great, she denies any chest pain or shortness of breath, she continues to walk without a walker. She has no orthopnea paroxysmal nocturnal dyspnea. She doesn't have any memory impairment. No bleeding with Coumadin. She gets occasional palpitation especially at night that are not associated with chest pain dizziness or syncope. She has no side effects from her medications but has noticed some mild memory impairment. She has noticed mild lower extremity edema around her ankles. She was in the hospital in December 2018 with chest pain, she was ruled out for acute coronary syndrome, her echocardiogram showed normal LV EF 60-65%, only mild mitral and tricuspid regurgitation only mild pulmonary hypertension.  Current Meds  Medication Sig  . ALPRAZolam (XANAX) 0.25 MG tablet Take 0.125-0.25 mg by mouth 2 (two) times daily as needed for anxiety.  . Cholecalciferol (VITAMIN D) 2000  UNITS CAPS Take 2,000 Units by mouth every other day.  . Cyanocobalamin (VITAMIN B-12) 500 MCG SUBL Place 1 tablet (500 mcg total) under the tongue daily at 2 PM.  . metoprolol tartrate (LOPRESSOR) 25 MG tablet TAKE 1 TABLET BY MOUTH TWICE A DAY  . pantoprazole (PROTONIX) 40 MG tablet TAKE 1 TABLET (40 MG TOTAL) BY MOUTH DAILY.  . ramipril (ALTACE) 10 MG capsule TAKE 1 CAPSULE (10 MG TOTAL) BY MOUTH DAILY.  . ranitidine (ZANTAC) 150 MG capsule Take 1 capsule (150 mg total) by mouth 2 (two) times daily.  Marland Kitchen warfarin (COUMADIN) 2.5 MG tablet TAKE AS DIRECTED BY ANTICOAGULATION CLINIC. A 30 DAY SUPPLY  . [DISCONTINUED] atorvastatin (LIPITOR) 10 MG tablet TAKE 1 TABLET BY MOUTH DAILY AT 6 PM. (Patient taking differently: TAKE 1 TABLET 10mg   BY MOUTH DAILY AT 6 PM.)   Allergies  Allergen Reactions  . Darifenacin Hydrobromide     REACTION: causes her dizziness  . Sulfonamide Derivatives Other (See Comments)    Pt thinks it caused dizziness  . Tramadol Other (See Comments)    Insomnia, anorexia   Past Medical History:  Diagnosis Date  . Anemia   . Anxiety   . Arthritis    hips, knees  . Atrial fibrillation ([place])   . Current use of long term anticoagulation   . Diverticulitis of colon 02/06/2015  . Diverticulosis   . Gait difficulty   . GERD (gastroesophageal reflux disease)   .  Headache(784.0) 06/22/2012  . Hyperlipidemia   . Hypertension   . IBS (irritable bowel syndrome)   . Incontinence   . Loss of weight 06/05/2015  . Medicare annual wellness visit, subsequent 02/05/2014  . Melena   . Mitral and aortic regurgitation   . Mitral valve prolapse    hx of  . Osteopenia   . Pedal edema 10/01/2016  . Personal history of colonic polyps   . Renal lithiasis 06/26/2015  . Thyroid disease    hypo  . Unspecified adverse effect of other drug, medicinal and biological substance(995.29)   . Vitamin B12 deficiency 03/16/2016  . Vitamin D deficiency    Family History  Problem Relation Age of  Onset  . Diabetes Mother   . CVA Mother   . Stroke Mother   . COPD Father   . Rheum arthritis Father   . Allergies Daughter   . COPD Daughter   . Stroke Daughter   . COPD Daughter        previous smoker  . Stroke Maternal Grandfather   . Heart disease Maternal Aunt   . Heart disease Maternal Uncle   . Colon cancer Neg Hx   . Colon polyps Neg Hx   . Esophageal cancer Neg Hx   . Gallbladder disease Neg Hx   . Kidney disease Neg Hx   . Heart attack Neg Hx   . Hypertension Neg Hx    Past Surgical History:  Procedure Laterality Date  . BOTOX INJECTION N/A 09/03/2012   Procedure: BOTOX INJECTION;  Surgeon: Inda Castle, MD;  [facility];  Service: Endoscopy;  Laterality: N/A;  . BREAST BIOPSY Left   . BREAST LUMPECTOMY Right 08/18/2014   Procedure: RIGHT BREAST LUMPECTOMY;  Surgeon: Coralie Keens, MD;  Location: [facility];  Service: General;  Laterality: Right;  . CARDIAC ELECTROPHYSIOLOGY MAPPING AND ABLATION    . CATARACT EXTRACTION, BILATERAL    . ESOPHAGOGASTRODUODENOSCOPY N/A 09/03/2012   Procedure: ESOPHAGOGASTRODUODENOSCOPY (EGD);  Surgeon: Inda Castle, MD;  Location: [facility];  Service: Endoscopy;  Laterality: N/A;  . ESOPHAGOSCOPY W/ BOTOX INJECTION    . I&D EXTREMITY Right 11/06/2012   Procedure: IRRIGATION AND DEBRIDEMENT EXTREMITY Right Ring Finger;  Surgeon: Tennis Must, MD;  Location: [facility];  Service: Orthopedics;  Laterality: Right;  . PARTIAL HYSTERECTOMY     ovaries left in place   Social History   Socioeconomic History  . Marital status: Widowed    Spouse name: Not on file  . Number of children: 5  . Years of education: Not on file  . Highest education level: Not on file  Social Needs  . Financial resource strain: Not on file  . Food insecurity - worry: Not on file  . Food insecurity - inability: Not on file  . Transportation needs - medical: Not on file  . Transportation needs - non-medical: Not on file    Occupational History  . Occupation: retired    Fish farm manager: RETIRED  Tobacco Use  . Smoking status: Never Smoker  . Smokeless tobacco: Never Used  Substance and Sexual Activity  . Alcohol use: No  . Drug use: No  . Sexual activity: Not on file    Comment: lives with Daughter, grandson and his family. no dietary restricitons.  Other Topics Concern  . Not on file  Social History Narrative  . Not on file     Review of Systems: General: negative for chills, fever, night sweats or weight changes.  Cardiovascular: negative for chest pain, dyspnea on exertion, edema, orthopnea, palpitations, paroxysmal nocturnal dyspnea or shortness of breath Dermatological: negative for rash Respiratory: negative for cough or wheezing Urologic: negative for hematuria Abdominal: negative for nausea, vomiting, diarrhea, bright red blood per rectum, melena, or hematemesis Neurologic: negative for visual changes, syncope, or dizziness All other systems reviewed and are otherwise negative except as noted above.   Physical Exam:  Blood pressure (!) 150/70, pulse (!) 50, height 4\' 11"  (1.499 m), weight 99 lb (44.9 kg).  General appearance: alert, cooperative and no distress Neck: no carotid bruit and no JVD Lungs: clear to auscultation bilaterally Heart: irregularly irregular rhythm and regular rate Extremities: extremities normal, atraumatic, no cyanosis, mild pitting bilateral edema around the ankles. Pulses: 2+ and symmetric Skin: Skin color, texture, turgor normal. No rashes or lesions Neurologic: Grossly normal  EKG not performed -- personally reviewed     ASSESSMENT AND PLAN:   1. Paroxysmal Afib/Increased Palpitations: Her last EKGs show sinus rhythm with first-degree AV block, she has occasional palpitations mostly at night but no dizziness or syncope. Her baseline rate is in 50s. Will continue the same management, she has no bleeding with Coumadin.  2. HTN:  Minimally elevated, I would  continue the same management considering her age and risk of fall.   3. Lipid profile - on atorvastatin 10 mg daily, mild memory impairment, I will discontinue.  4. Mild pulmonary hypertension - this is unchanged in several years and is asymptomatic.  5. Mild lower extremity edema - I will start Lasix 20 mg daily to be taken for the next 2 days afterwards as needed.  Follow-Up w/ Dr. [name] in 3 months.   Ena Dawley, MD [facility] 02/08/2017 1:11 PM

## 2017-02-08 NOTE — Patient Instructions (Signed)
Description   Today take 1.5 tablets then continue on same dosage 1 tablet daily except 1.5 tablets on Mondays. Recheck INR in 3 weeks. Call our office if you are placed on any new medications or if you have any concerns [phone].     

## 2017-02-11 ENCOUNTER — Telehealth: Payer: Self-pay | Admitting: Family Medicine

## 2017-02-11 NOTE — Telephone Encounter (Signed)
Zantac 150 mg refill Last OV: 02/01/17  Pharmacy:[facility]  New prescription will need to be sent with recommended dose change 300 mg qhs per last OV note 02/01/17.

## 2017-02-11 NOTE — Telephone Encounter (Signed)
Copied from [place]. Topic: Quick Communication - Rx Refill/Question >> Feb 11, 2017  3:22 PM Arletha Grippe wrote: Medication:ranitidine (ZANTAC) 150 MG capsule      Has the patient contacted their pharmacy? Yes.  Pharm called    (Agent: If no, request that the patient contact the pharmacy for the refill.)   Preferred Pharmacy (with phone number or street name): [facility] - she wants to know if medication dose should be 150 or 300mg .  Pt is asking for refill on 300 mg. Strength and 150 mg was sent in to pharmacy - call [phone] for clarification    Agent: Please be advised that RX refills may take up to 3 business days. We ask that you follow-up with your pharmacy.

## 2017-02-12 ENCOUNTER — Telehealth: Payer: Self-pay

## 2017-02-12 NOTE — Telephone Encounter (Signed)
PA denied for capsules. Preferred medications are cimetidine or ranitidine syrup. Please advise.

## 2017-02-12 NOTE — Telephone Encounter (Signed)
Let's try d/c the 150 mg caps and try and get Ranitidine 300 mg capsules, 1 cap po qhs prn reflux they might pay for the prescription strength

## 2017-02-14 MED ORDER — RANITIDINE HCL 150 MG PO CAPS: 300.0000 mg | ORAL_CAPSULE | Freq: Every day | ORAL | 1 refills | Status: DC

## 2017-02-14 NOTE — Telephone Encounter (Signed)
New rx sig sent in

## 2017-02-18 NOTE — Telephone Encounter (Signed)
Called left message to call back 

## 2017-02-19 ENCOUNTER — Other Ambulatory Visit: Payer: Self-pay | Admitting: Family Medicine

## 2017-02-28 ENCOUNTER — Ambulatory Visit (INDEPENDENT_AMBULATORY_CARE_PROVIDER_SITE_OTHER): Payer: Medicare Other | Admitting: *Deleted

## 2017-02-28 DIAGNOSIS — I4891 Unspecified atrial fibrillation: Secondary | ICD-10-CM

## 2017-02-28 DIAGNOSIS — I080 Rheumatic disorders of both mitral and aortic valves: Secondary | ICD-10-CM

## 2017-02-28 DIAGNOSIS — Z5181 Encounter for therapeutic drug level monitoring: Secondary | ICD-10-CM | POA: Diagnosis not present

## 2017-02-28 LAB — POCT INR: INR: 2.8

## 2017-02-28 NOTE — Patient Instructions (Signed)
Description   Continue on same dosage 1 tablet daily except 1.5 tablets on Mondays. Recheck INR in 4 weeks. Call our office if you are placed on any new medications or if you have any concerns [phone].     

## 2017-03-07 ENCOUNTER — Other Ambulatory Visit: Payer: Self-pay | Admitting: *Deleted

## 2017-03-07 MED ORDER — RANITIDINE HCL 300 MG PO CAPS: 300.0000 mg | ORAL_CAPSULE | Freq: Every day | ORAL | 1 refills | Status: DC

## 2017-03-07 NOTE — Telephone Encounter (Signed)
rx sent in 

## 2017-03-18 ENCOUNTER — Other Ambulatory Visit: Payer: Self-pay | Admitting: Family Medicine

## 2017-03-22 ENCOUNTER — Other Ambulatory Visit: Payer: Self-pay | Admitting: Family Medicine

## 2017-03-26 ENCOUNTER — Other Ambulatory Visit: Payer: Self-pay | Admitting: Family Medicine

## 2017-03-26 MED ORDER — ALPRAZOLAM 0.25 MG PO TABS: 0.1250 mg | ORAL_TABLET | Freq: Two times a day (BID) | ORAL | 0 refills | Status: DC | PRN

## 2017-03-26 NOTE — Telephone Encounter (Signed)
Request for Xanax refill. Medication not  filled by Dr. Regis Bill previously.  LOV: 02/01/17  Dr. Charlett Blake  [facility]

## 2017-03-26 NOTE — Telephone Encounter (Signed)
Copied from [place] [id number]. Topic: Quick Communication - Rx Refill/Question >> Mar 26, 2017 10:37 AM Cleaster Corin, [place] wrote: Medication: ALPRAZolam Duanne Moron) 0.25 MG tablet [[id number]]  Has the patient contacted their pharmacy?yes (Agent: If no, request that the patient contact the pharmacy for the refill.) Preferred Pharmacy (with phone number or street name):[id number] [place] [place] [address] [address] Phone: [phone] Fax: [phone]   Agent: Please be advised that RX refills may take up to 3 business days. We ask that you follow-up with your pharmacy.

## 2017-03-27 ENCOUNTER — Ambulatory Visit (INDEPENDENT_AMBULATORY_CARE_PROVIDER_SITE_OTHER): Payer: Medicare Other | Admitting: *Deleted

## 2017-03-27 DIAGNOSIS — Z5181 Encounter for therapeutic drug level monitoring: Secondary | ICD-10-CM

## 2017-03-27 DIAGNOSIS — I4891 Unspecified atrial fibrillation: Secondary | ICD-10-CM | POA: Diagnosis not present

## 2017-03-27 LAB — POCT INR: INR: 2.3

## 2017-03-27 NOTE — Patient Instructions (Signed)
Description   Continue on same dosage 1 tablet daily except 1.5 tablets on Mondays. Recheck INR in 4 weeks. Call our office if you are placed on any new medications or if you have any concerns [phone].     

## 2017-03-28 NOTE — Telephone Encounter (Signed)
Medication sent in. 

## 2017-04-14 ENCOUNTER — Other Ambulatory Visit: Payer: Self-pay | Admitting: Family Medicine

## 2017-04-24 ENCOUNTER — Ambulatory Visit (INDEPENDENT_AMBULATORY_CARE_PROVIDER_SITE_OTHER): Payer: Medicare Other | Admitting: *Deleted

## 2017-04-24 DIAGNOSIS — Z5181 Encounter for therapeutic drug level monitoring: Secondary | ICD-10-CM | POA: Diagnosis not present

## 2017-04-24 DIAGNOSIS — I4891 Unspecified atrial fibrillation: Secondary | ICD-10-CM | POA: Diagnosis not present

## 2017-04-24 LAB — POCT INR: INR: 2.5

## 2017-04-24 NOTE — Patient Instructions (Signed)
Description   Continue on same dosage 1 tablet daily except 1.5 tablets on Mondays. Recheck INR in 6 weeks. Call our office if you are placed on any new medications or if you have any concerns [phone].

## 2017-05-09 ENCOUNTER — Ambulatory Visit (INDEPENDENT_AMBULATORY_CARE_PROVIDER_SITE_OTHER): Payer: Medicare Other | Admitting: Family Medicine

## 2017-05-09 ENCOUNTER — Encounter: Payer: Self-pay | Admitting: Family Medicine

## 2017-05-09 VITALS — BP 150/86 | HR 55 | Temp 97.6°F | Resp 16 | Ht 59.06 in | Wt 94.0 lb

## 2017-05-09 DIAGNOSIS — E782 Mixed hyperlipidemia: Secondary | ICD-10-CM

## 2017-05-09 DIAGNOSIS — E559 Vitamin D deficiency, unspecified: Secondary | ICD-10-CM

## 2017-05-09 DIAGNOSIS — E538 Deficiency of other specified B group vitamins: Secondary | ICD-10-CM

## 2017-05-09 DIAGNOSIS — R739 Hyperglycemia, unspecified: Secondary | ICD-10-CM

## 2017-05-09 DIAGNOSIS — Z79899 Other long term (current) drug therapy: Secondary | ICD-10-CM

## 2017-05-09 DIAGNOSIS — G4700 Insomnia, unspecified: Secondary | ICD-10-CM

## 2017-05-09 DIAGNOSIS — F411 Generalized anxiety disorder: Secondary | ICD-10-CM | POA: Diagnosis not present

## 2017-05-09 DIAGNOSIS — E039 Hypothyroidism, unspecified: Secondary | ICD-10-CM | POA: Diagnosis not present

## 2017-05-09 LAB — COMPREHENSIVE METABOLIC PANEL
ALT: 9 U/L (ref 0–35)
AST: 15 U/L (ref 0–37)
Albumin: 3.8 g/dL (ref 3.5–5.2)
Alkaline Phosphatase: 14 U/L — ABNORMAL LOW (ref 39–117)
BUN: 16 mg/dL (ref 6–23)
CALCIUM: 9.4 mg/dL (ref 8.4–10.5)
CO2: 31 meq/L (ref 19–32)
Chloride: 102 mEq/L (ref 96–112)
Creatinine, Ser: 0.98 mg/dL (ref 0.40–1.20)
GFR: 56.5 mL/min — ABNORMAL LOW (ref >60.00)
Glucose, Bld: 103 mg/dL — ABNORMAL HIGH (ref 70–99)
Potassium: 4.4 mEq/L (ref 3.5–5.1)
Sodium: 139 mEq/L (ref 135–145)
TOTAL PROTEIN: 7.2 g/dL (ref 6.0–8.3)
Total Bilirubin: 0.5 mg/dL (ref 0.2–1.2)

## 2017-05-09 LAB — VITAMIN B12: Vitamin B-12: 557 pg/mL (ref 211–911)

## 2017-05-09 LAB — CBC
HCT: 35.8 % — ABNORMAL LOW (ref 36.0–46.0)
HEMOGLOBIN: 12.2 g/dL (ref 12.0–15.0)
MCHC: 34.2 g/dL (ref 30.0–36.0)
MCV: 89.4 fl (ref 78.0–100.0)
PLATELETS: 185 10*3/uL (ref 150.0–400.0)
RBC: 4.01 Mil/uL (ref 3.87–5.11)
RDW: 15.6 % — ABNORMAL HIGH (ref 11.5–15.5)
WBC: 6.8 10*3/uL (ref 4.0–10.5)

## 2017-05-09 LAB — LIPID PANEL
CHOL/HDL RATIO: 5
CHOLESTEROL: 182 mg/dL (ref 0–200)
HDL: 39 mg/dL — AB (ref >39.00)
LDL CALC: 109 mg/dL — AB (ref 0–99)
NonHDL: 143.45
TRIGLYCERIDES: 171 mg/dL — AB (ref 0.0–149.0)
VLDL: 34.2 mg/dL (ref 0.0–40.0)

## 2017-05-09 LAB — HEMOGLOBIN A1C: HEMOGLOBIN A1C: 5.9 % (ref 4.6–6.5)

## 2017-05-09 LAB — VITAMIN D 25 HYDROXY (VIT D DEFICIENCY, FRACTURES): VITD: 42.07 ng/mL (ref 30.00–100.00)

## 2017-05-09 LAB — TSH: TSH: 2.56 u[IU]/mL (ref 0.35–4.50)

## 2017-05-09 MED ORDER — RANITIDINE HCL 150 MG PO TABS: 150.0000 mg | ORAL_TABLET | Freq: Two times a day (BID) | ORAL | Status: DC

## 2017-05-09 MED ORDER — ALPRAZOLAM 0.25 MG PO TABS: 0.1250 mg | ORAL_TABLET | Freq: Two times a day (BID) | ORAL | 2 refills | Status: DC | PRN

## 2017-05-09 MED ORDER — AZELASTINE HCL 0.1 % NA SOLN: 2.0000 | Freq: Two times a day (BID) | NASAL | 3 refills | Status: DC

## 2017-05-09 MED ORDER — RAMIPRIL 10 MG PO CAPS: 10.0000 mg | ORAL_CAPSULE | Freq: Two times a day (BID) | ORAL | 3 refills | Status: DC

## 2017-05-09 NOTE — Assessment & Plan Note (Signed)
With neck pain at times, try aspercreme lidocaine at occiput if no improvement cal neurology, Dr Domingo Cocking for further intervention

## 2017-05-09 NOTE — Patient Instructions (Signed)
Try aspercreme Lidocaine gel for the neck pain and headache if no relief call Dr Domingo Cocking for appointment   Nonallergic Rhinitis Nonallergic rhinitis is a condition that causes symptoms that affect the nose, such as a runny nose and a stuffed-up nose (nasal congestion) that can make it hard to breathe through the nose. This condition is different from having an allergy (allergic rhinitis). Allergic rhinitis occurs when the body's defense system (immune system) reacts to a substance that you are allergic to (allergen), such as pollen, pet dander, mold, or dust. Nonallergic rhinitis has many similar symptoms, but it is not caused by allergens. Nonallergic rhinitis can be a short-term or long-term problem. What are the causes? This condition can be caused by many different things. Some common types of nonallergic rhinitis include: Infectious rhinitis  This is usually due to an infection in the upper respiratory tract. Vasomotor rhinitis  This is the most common type of long-term nonallergic rhinitis.  It is caused by too much blood flow through the nose, which makes the tissue inside of the nose swell.  Symptoms are often triggered by strong odors, cold air, stress, drinking alcohol, cigarette smoke, or changes in the weather. Occupational rhinitis  This type is caused by triggers in the workplace, such as chemicals, dusts, animal dander, or air pollution. Hormonal rhinitis  This type occurs in women as a result of an increase in the female hormone estrogen.  It may occur during pregnancy, puberty, and menstrual cycles.  Symptoms improve when estrogen levels drop. Drug-induced rhinitis Several drugs can cause nonallergic rhinitis, including:  Medicines that are used to treat high blood pressure, heart disease, and Parkinson disease.  Aspirin and NSAIDs.  Over-the-counter nasal decongestant sprays. These can cause a type of nonallergic rhinitis (rhinitis medicamentosa) when they are used  for more than a few days.  Nonallergic rhinitis with eosinophilia syndrome (NARES)  This type is caused by having too much of a certain type of white blood cell (eosinophil). Nonallergic rhinitis can also be caused by a reaction to eating hot or spicy foods. This does not usually cause long-term symptoms. In some cases, the cause of nonallergic rhinitis is not known. What increases the risk? You are more likely to develop this condition if:  You are 51-40 years of age.  You are a woman. Women are twice as likely to have this condition.  What are the signs or symptoms? Common symptoms of this condition include:  Nasal congestion.  Runny nose.  The feeling of mucus going down the back of the throat (postnasal drip).  Trouble sleeping at night and daytime sleepiness.  Less common symptoms include:  Sneezing.  Coughing.  Itchy nose.  Bloodshot eyes.  How is this diagnosed? This condition may be diagnosed based on:  Your symptoms and medical history.  A physical exam.  Allergy testing to rule out allergic rhinitis. You may have skin tests or blood tests.  In some cases, the health care provider may take a swab of nasal secretions to look for an increased number of eosinophils. This would be done to confirm a diagnosis of NARES. How is this treated? Treatment for this condition depends on the cause. No single treatment works for everyone. Work with your health care provider to find the best treatment for you. Treatment may include:  Avoiding the things that trigger your symptoms.  Using medicines to relieve congestion, such as: ? Steroid nasal spray. There are many types. You may need to try a few to  find out which one works best. ? Decongestant medicine. This may be an oral medicine or a nasal spray. These medicines are only used for a short time.  Using medicines to relieve a runny nose. These may include antihistamine medicines or anticholinergic nasal  sprays.  Surgery to remove tissue from inside the nose may be needed in severe cases if the condition has not improved after 6-12 months of medical treatment. Follow these instructions at home:  Take or use over-the-counter and prescription medicines only as told by your health care provider. Do not stop using your medicine even if you start to feel better.  Use salt-water (saline) rinses or other solutions (nasal washes or irrigations) to wash or rinse out the inside of your nose as told by your health care provider.  Do not take NSAIDs or medicines that contain aspirin if they make your symptoms worse.  Do not drink alcohol if it makes your symptoms worse.  Do not use any tobacco products, such as cigarettes, chewing tobacco, and e-cigarettes. If you need help quitting, ask your health care provider.  Avoid secondhand smoke.  Get some exercise every day. Exercise may help reduce symptoms of nonallergic rhinitis for some people. Ask your health care provider how much exercise and what types of exercise are safe for you.  Sleep with the head of your bed raised (elevated). This may reduce nighttime nasal congestion.  Keep all follow-up visits as told by your health care provider. This is important. Contact a health care provider if:  You have a fever.  Your symptoms are getting worse at home.  Your symptoms are not responding to medicine.  You develop new symptoms, especially a headache or nosebleed. This information is not intended to replace advice given to you by your health care provider. Make sure you discuss any questions you have with your health care provider. Document Released: 04/11/2015 Document Revised: 05/26/2015 Document Reviewed: 03/10/2015 Elsevier Interactive Patient Education  2018 [employer]. Occipital Neuralgia Occipital neuralgia is a type of headache that causes episodes of very bad pain in the back of your head. Pain from occipital neuralgia may spread  (radiate) to other parts of your head. The pain is usually brief and often goes away after you rest and relax. These headaches may be caused by irritation of the nerves that leave your spinal cord high up in your neck, just below the base of your skull (occipital nerves). Your occipital nerves transmit sensations from the back of your head, the top of your head, and the areas behind your ears. What are the causes? Occipital neuralgia can occur without any known cause (primary headache syndrome). In other cases, occipital neuralgia is caused by pressure on or irritation of one of the two occipital nerves. Causes of occipital nerve compression or irritation include:  Wear and tear of the vertebrae in the neck (osteoarthritis).  Neck injury.  Disease of the disks that separate the vertebrae.  Tumors.  Gout.  Infections.  Diabetes.  Swollen blood vessels that put pressure on the occipital nerves.  Muscle spasm in the neck.  What are the signs or symptoms? Pain is the main symptom of occipital neuralgia. It usually starts in the back of the head but may also be felt in other areas supplied by the occipital nerves. Pain is usually on one side but may be on both sides. You may have:  Brief episodes of very bad pain that is burning, stabbing, shocking, or shooting.  Pain behind the  eye.  Pain triggered by neck movement or hair brushing.  Scalp tenderness.  Aching in the back of the head between episodes of very bad pain.  How is this diagnosed? Your health care provider may diagnose occipital neuralgia based on your symptoms and a physical exam. During the exam, the health care provider may push on areas supplied by the occipital nerves to see if they are painful. Some tests may also be done to help in making the diagnosis. These may include:  Imaging studies of the upper spinal cord, such as an MRI or CT scan. These may show compression or spinal cord abnormalities.  Nerve block.  You will get an injection of numbing medicine (local anesthetic) near the occipital nerve to see if this relieves pain.  How is this treated? Treatment may begin with simple measures, such as:  Rest.  Massage.  Heat.  Over-the-counter pain relievers.  If these measures do not work, you may need other treatments, including:  Medicines such as: ? Prescription-strength anti-inflammatory medicines. ? Muscle relaxants. ? Antiseizure medicines. ? Antidepressants.  Steroid injection. This involves injections of local anesthetic and strong anti-inflammatory drugs (steroids).  Pulsed radiofrequency. Wires are implanted to deliver electrical impulses that block pain signals from the occipital nerve.  Physical therapy.  Surgery to relieve nerve pressure.  Follow these instructions at home:  Take all medicines as directed by your health care provider.  Avoid activities that cause pain.  Rest when you have an attack of pain.  Try gentle massage or a heating pad to relieve pain.  Work with a physical therapist to learn stretching exercises you can do at home.  Try a different pillow or sleeping position.  Practice good posture.  Try to stay active. Get regular exercise that does not cause pain. Ask your health care provider to suggest safe exercises for you.  Keep all follow-up visits as directed by your health care provider. This is important. Contact a health care provider if:  Your medicine is not working.  You have new or worsening symptoms. Get help right away if:  You have very bad head pain that is not going away.  You have a sudden change in vision, balance, or speech. This information is not intended to replace advice given to you by your health care provider. Make sure you discuss any questions you have with your health care provider. Document Released: 12/12/2000 Document Revised: 05/26/2015 Document Reviewed: 12/10/2012 Elsevier Interactive Patient Education   2017 [employer].

## 2017-05-09 NOTE — Assessment & Plan Note (Signed)
Takes daily SL 1000

## 2017-05-09 NOTE — Assessment & Plan Note (Signed)
On Levothyroxine, continue to monitor 

## 2017-05-09 NOTE — Assessment & Plan Note (Signed)
Takes vitamin D daily 2000 IU

## 2017-05-09 NOTE — Assessment & Plan Note (Signed)
hgba1c acceptable, minimize simple carbs. Increase exercise as tolerated.  

## 2017-05-09 NOTE — Progress Notes (Signed)
Subjective:  I acted as a Education administrator for [employer]. Yancey Flemings, [place]   Patient ID: [name] [name], female    DOB: 1924/12/06, 84 y.o.   MRN: [id number]  Chief Complaint  Patient presents with  . Follow-up    HPI  Patient is in today for follow up visit. She is accompanied by family they report she is doing well. No recent febrile illness or hospitalization. Notes some ongoing clear runny nose worse after hot liquids. She requests a refill on Alprazolam and they deny any falls, confusion or side effects with meds. Uses for anxiety attacks and insomnia but they are manageable most of the time. Denies CP/palp/SOB/HA/congestion/fevers/GI or GU c/o. Taking meds as prescribed  Patient Care Team: Mosie Lukes, MD as PCP - General (Family Medicine)   Past Medical History:  Diagnosis Date  . Anemia   . Anxiety   . Arthritis    hips, knees  . Atrial fibrillation ([place])   . Current use of long term anticoagulation   . Diverticulitis of colon 02/06/2015  . Diverticulosis   . Gait difficulty   . GERD (gastroesophageal reflux disease)   . Headache(784.0) 06/22/2012  . Hyperlipidemia   . Hypertension   . IBS (irritable bowel syndrome)   . Incontinence   . Loss of weight 06/05/2015  . Medicare annual wellness visit, subsequent 02/05/2014  . Melena   . Mitral and aortic regurgitation   . Mitral valve prolapse    hx of  . Osteopenia   . Pedal edema 10/01/2016  . Personal history of colonic polyps   . Renal lithiasis 06/26/2015  . Thyroid disease    hypo  . Unspecified adverse effect of other drug, medicinal and biological substance(995.29)   . Vitamin B12 deficiency 03/16/2016  . Vitamin D deficiency     Past Surgical History:  Procedure Laterality Date  . BOTOX INJECTION N/A 09/03/2012   Procedure: BOTOX INJECTION;  Surgeon: Inda Castle, MD;  Location: [facility];  Service: Endoscopy;  Laterality: N/A;  . BREAST BIOPSY Left   . BREAST LUMPECTOMY Right 08/18/2014   Procedure: RIGHT BREAST  LUMPECTOMY;  Surgeon: Coralie Keens, MD;  Location: [facility];  Service: General;  Laterality: Right;  . CARDIAC ELECTROPHYSIOLOGY MAPPING AND ABLATION    . CATARACT EXTRACTION, BILATERAL    . ESOPHAGOGASTRODUODENOSCOPY N/A 09/03/2012   Procedure: ESOPHAGOGASTRODUODENOSCOPY (EGD);  Surgeon: Inda Castle, MD;  Location: [facility];  Service: Endoscopy;  Laterality: N/A;  . ESOPHAGOSCOPY W/ BOTOX INJECTION    . I&D EXTREMITY Right 11/06/2012   Procedure: IRRIGATION AND DEBRIDEMENT EXTREMITY Right Ring Finger;  Surgeon: Tennis Must, MD;  Location: [facility];  Service: Orthopedics;  Laterality: Right;  . PARTIAL HYSTERECTOMY     ovaries left in place    Family History  Problem Relation Age of Onset  . Diabetes Mother   . CVA Mother   . Stroke Mother   . COPD Father   . Rheum arthritis Father   . Allergies Daughter   . COPD Daughter   . Stroke Daughter   . COPD Daughter        previous smoker  . Stroke Maternal Grandfather   . Heart disease Maternal Aunt   . Heart disease Maternal Uncle   . Colon cancer Neg Hx   . Colon polyps Neg Hx   . Esophageal cancer Neg Hx   . Gallbladder disease Neg Hx   . Kidney disease Neg Hx   . Heart attack  Neg Hx   . Hypertension Neg Hx     Social History   Socioeconomic History  . Marital status: Widowed    Spouse name: Not on file  . Number of children: 5  . Years of education: Not on file  . Highest education level: Not on file  Occupational History  . Occupation: retired    Fish farm manager: RETIRED  Social Needs  . Financial resource strain: Not on file  . Food insecurity:    Worry: Not on file    Inability: Not on file  . Transportation needs:    Medical: Not on file    Non-medical: Not on file  Tobacco Use  . Smoking status: Never Smoker  . Smokeless tobacco: Never Used  Substance and Sexual Activity  . Alcohol use: No  . Drug use: No  . Sexual activity: Not on file    Comment: lives with Daughter, grandson and  his family. no dietary restricitons.  Lifestyle  . Physical activity:    Days per week: Not on file    Minutes per session: Not on file  . Stress: Not on file  Relationships  . Social connections:    Talks on phone: Not on file    Gets together: Not on file    Attends religious service: Not on file    Active member of club or organization: Not on file    Attends meetings of clubs or organizations: Not on file    Relationship status: Not on file  . Intimate partner violence:    Fear of current or ex partner: Not on file    Emotionally abused: Not on file    Physically abused: Not on file    Forced sexual activity: Not on file  Other Topics Concern  . Not on file  Social History Narrative  . Not on file    Outpatient Medications Prior to Visit  Medication Sig Dispense Refill  . Cholecalciferol (VITAMIN D) 2000 UNITS CAPS Take 2,000 Units by mouth every other day.    . Cyanocobalamin (VITAMIN B-12) 500 MCG SUBL Place 1 tablet (500 mcg total) under the tongue daily at 2 PM. 150 tablet   . metoprolol tartrate (LOPRESSOR) 25 MG tablet TAKE 1 TABLET BY MOUTH TWICE A DAY 180 tablet 1  . pantoprazole (PROTONIX) 40 MG tablet TAKE 1 TABLET (40 MG TOTAL) BY MOUTH DAILY. 90 tablet 1  . warfarin (COUMADIN) 2.5 MG tablet TAKE AS DIRECTED BY ANTICOAGULATION CLINIC. A 30 DAY SUPPLY 40 tablet 3  . ALPRAZolam (XANAX) 0.25 MG tablet Take 0.5-1 tablets (0.125-0.25 mg total) by mouth 2 (two) times daily as needed for anxiety. 30 tablet 0  . atorvastatin (LIPITOR) 10 MG tablet TAKE 1 TABLET BY MOUTH DAILY AT 6 PM. 30 tablet 3  . ramipril (ALTACE) 10 MG capsule TAKE 1 CAPSULE (10 MG TOTAL) BY MOUTH DAILY. 90 capsule 0  . ranitidine (ZANTAC) 300 MG capsule Take 1 capsule (300 mg total) by mouth at bedtime. 90 capsule 1  . furosemide (LASIX) 20 MG tablet Take 1 tablet (20 mg total) by mouth daily as needed. (Patient not taking: Reported on 05/09/2017) 30 tablet 6   No facility-administered medications  prior to visit.     Allergies  Allergen Reactions  . Darifenacin Hydrobromide     REACTION: causes her dizziness  . Sulfonamide Derivatives Other (See Comments)    Pt thinks it caused dizziness  . Tramadol Other (See Comments)    Insomnia, anorexia  Review of Systems  Constitutional: Negative for fever and malaise/fatigue.  HENT: Positive for congestion. Negative for nosebleeds.   Eyes: Negative for blurred vision.  Respiratory: Negative for shortness of breath.   Cardiovascular: Negative for chest pain, palpitations and leg swelling.  Gastrointestinal: Negative for abdominal pain, blood in stool and nausea.  Genitourinary: Negative for dysuria and frequency.  Musculoskeletal: Positive for neck pain. Negative for falls.  Skin: Negative for rash.  Neurological: Positive for headaches. Negative for dizziness and loss of consciousness.  Endo/Heme/Allergies: Negative for environmental allergies.  Psychiatric/Behavioral: Negative for depression. The patient is nervous/anxious and has insomnia.        Objective:    Physical Exam  Constitutional: She is oriented to person, place, and time. No distress.  HENT:  Head: Normocephalic and atraumatic.  Eyes: Conjunctivae are normal.  Neck: Neck supple. No thyromegaly present.  Cardiovascular: Normal rate and regular rhythm.  Murmur heard. Pulmonary/Chest: Effort normal and breath sounds normal. She has no wheezes.  Abdominal: She exhibits no distension and no mass.  Musculoskeletal: She exhibits no edema.  Lymphadenopathy:    She has no cervical adenopathy.  Neurological: She is alert and oriented to person, place, and time.  Skin: Skin is warm and dry. No rash noted. She is not diaphoretic.  Psychiatric: Judgment normal.    BP (!) 150/86 (BP Location: Left Arm, Patient Position: Sitting, Cuff Size: Normal)   Pulse (!) 55   Temp 97.6 F (36.4 C) (Oral)   Resp 16   Ht 4' 11.06" (1.5 m)   Wt 94 lb (42.6 kg)   SpO2 99%    BMI 18.95 kg/m  Wt Readings from Last 3 Encounters:  05/09/17 94 lb (42.6 kg)  02/08/17 99 lb (44.9 kg)  02/01/17 106 lb (48.1 kg)   BP Readings from Last 3 Encounters:  05/09/17 (!) 150/86  02/08/17 (!) 150/70  02/01/17 118/68     Immunization History  Administered Date(s) Administered  . Influenza Whole 09/18/2007, 10/02/2010, 09/15/2012  . Influenza, High Dose Seasonal PF 10/01/2016  . Influenza,inj,Quad PF,6+ Mos 08/24/2013, 08/31/2014  . Pneumococcal Conjugate-13 08/31/2014  . Pneumococcal Polysaccharide-23 02/20/2006  . Tdap 10/30/2010    Health Maintenance  Topic Date Due  . INFLUENZA VACCINE  08/01/2017  . TETANUS/TDAP  10/29/2020  . DEXA SCAN  Completed  . PNA vac Low Risk Adult  Completed    Lab Results  Component Value Date   WBC 5.6 02/01/2017   HGB 11.8 (L) 02/01/2017   HCT 35.5 (L) 02/01/2017   PLT 173.0 02/01/2017   GLUCOSE 101 (H) 12/19/2016   CHOL 106 12/10/2016   TRIG 64 12/10/2016   HDL 38 (L) 12/10/2016   LDLDIRECT 72.1 01/19/2009   LDLCALC 55 12/10/2016   ALT 13 (L) 12/11/2016   AST 19 12/11/2016   NA 142 12/19/2016   K 3.9 12/19/2016   CL 104 12/19/2016   CREATININE 1.04 12/19/2016   BUN 13 12/19/2016   CO2 33 (H) 12/19/2016   TSH 2.61 11/01/2016   INR 2.5 04/24/2017   HGBA1C 5.7 09/24/2016    Lab Results  Component Value Date   TSH 2.61 11/01/2016   Lab Results  Component Value Date   WBC 5.6 02/01/2017   HGB 11.8 (L) 02/01/2017   HCT 35.5 (L) 02/01/2017   MCV 92.6 02/01/2017   PLT 173.0 02/01/2017   Lab Results  Component Value Date   NA 142 12/19/2016   K 3.9 12/19/2016   CO2 33 (H) 12/19/2016  GLUCOSE 101 (H) 12/19/2016   BUN 13 12/19/2016   CREATININE 1.04 12/19/2016   BILITOT 0.5 12/11/2016   ALKPHOS 12 (L) 12/11/2016   AST 19 12/11/2016   ALT 13 (L) 12/11/2016   PROT 6.1 (L) 12/11/2016   ALBUMIN 3.1 (L) 12/11/2016   CALCIUM 8.9 12/19/2016   ANIONGAP 10 12/11/2016   GFR 52.80 (L) 12/19/2016   Lab Results   Component Value Date   CHOL 106 12/10/2016   Lab Results  Component Value Date   HDL 38 (L) 12/10/2016   Lab Results  Component Value Date   LDLCALC 55 12/10/2016   Lab Results  Component Value Date   TRIG 64 12/10/2016   Lab Results  Component Value Date   CHOLHDL 2.8 12/10/2016   Lab Results  Component Value Date   HGBA1C 5.7 09/24/2016         Assessment & Plan:   Problem List Items Addressed This Visit    Hypothyroidism    On Levothyroxine, continue to monitor      Relevant Orders   TSH   Vitamin D deficiency    Takes vitamin D daily 2000 IU      Relevant Orders   VITAMIN D 25 Hydroxy (Vit-D Deficiency, Fractures)   Hyperlipidemia, mixed    Encouraged heart healthy diet, increase exercise, avoid trans fats, consider a krill oil cap daily. Cardiology stopped Atorvastatin due to low numbers      Relevant Medications   ramipril (ALTACE) 10 MG capsule   Other Relevant Orders   Lipid panel   Anxiety state    Uses Alprazolam sparingly with good results for insomnia and anxiety UDS ordered today. Family agrees to watch for any concerning side effects. Patient does not endorse any concerns      Relevant Medications   ALPRAZolam (XANAX) 0.25 MG tablet   Hyperglycemia    hgba1c acceptable, minimize simple carbs. Increase exercise as tolerated.       Relevant Orders   Hemoglobin A1c   Comprehensive metabolic panel   Vitamin D22 deficiency    Takes daily SL 1000       Relevant Orders   CBC   Vitamin B12    Other Visit Diagnoses    Insomnia, unspecified type    -  Primary   Relevant Medications   ALPRAZolam (XANAX) 0.25 MG tablet   Other Relevant Orders   Pain Mgmt, Profile 8 w/Conf, U   High risk medication use       Relevant Orders   Pain Mgmt, Profile 8 w/Conf, U      I have discontinued Karie P. Pareja's atorvastatin and ranitidine. I have also changed her ramipril. Additionally, I am having her start on ranitidine and azelastine.  Lastly, I am having her maintain her Vitamin D, Vitamin B-12, warfarin, metoprolol tartrate, furosemide, pantoprazole, and ALPRAZolam.  Meds ordered this encounter  Medications  . ALPRAZolam (XANAX) 0.25 MG tablet    Sig: Take 0.5-1 tablets (0.125-0.25 mg total) by mouth 2 (two) times daily as needed for anxiety.    Dispense:  30 tablet    Refill:  2  . ranitidine (ZANTAC) 150 MG tablet    Sig: Take 1-2 tablets (150-300 mg total) by mouth 2 (two) times daily.  . ramipril (ALTACE) 10 MG capsule    Sig: Take 1 capsule (10 mg total) by mouth 2 (two) times daily.    Dispense:  60 capsule    Refill:  3  . azelastine (ASTELIN) 0.1 %  nasal spray    Sig: Place 2 sprays into both nostrils 2 (two) times daily. Use in each nostril as directed    Dispense:  30 mL    Refill:  3    CMA served as scribe during this visit. History, Physical and Plan performed by medical provider. Documentation and orders reviewed and attested to.  Penni Homans, MD

## 2017-05-09 NOTE — Assessment & Plan Note (Signed)
Uses Alprazolam sparingly with good results for insomnia and anxiety UDS ordered today. Family agrees to watch for any concerning side effects. Patient does not endorse any concerns

## 2017-05-09 NOTE — Assessment & Plan Note (Addendum)
Encouraged heart healthy diet, increase exercise, avoid trans fats, consider a krill oil cap daily. Cardiology stopped Atorvastatin due to low numbers

## 2017-05-10 LAB — PAIN MGMT, PROFILE 8 W/CONF, U
6 Acetylmorphine: NEGATIVE ng/mL (ref <10)
Alcohol Metabolites: NEGATIVE ng/mL (ref <500)
Amphetamines: NEGATIVE ng/mL (ref <500)
BENZODIAZEPINES: NEGATIVE ng/mL (ref <100)
Buprenorphine, Urine: NEGATIVE ng/mL (ref <5)
CREATININE: 59.1 mg/dL
Cocaine Metabolite: NEGATIVE ng/mL (ref <150)
MDMA: NEGATIVE ng/mL (ref <500)
Marijuana Metabolite: NEGATIVE ng/mL (ref <20)
OXIDANT: NEGATIVE ug/mL (ref <200)
OXYCODONE: NEGATIVE ng/mL (ref <100)
Opiates: NEGATIVE ng/mL (ref <100)
pH: 6.41 (ref 4.5–9.0)

## 2017-05-29 DIAGNOSIS — R51 Headache: Secondary | ICD-10-CM | POA: Diagnosis not present

## 2017-05-29 DIAGNOSIS — Z049 Encounter for examination and observation for unspecified reason: Secondary | ICD-10-CM | POA: Diagnosis not present

## 2017-05-29 DIAGNOSIS — M316 Other giant cell arteritis: Secondary | ICD-10-CM | POA: Diagnosis not present

## 2017-06-05 ENCOUNTER — Ambulatory Visit: Payer: Medicare Other | Admitting: *Deleted

## 2017-06-05 DIAGNOSIS — Z5181 Encounter for therapeutic drug level monitoring: Secondary | ICD-10-CM

## 2017-06-05 DIAGNOSIS — I4891 Unspecified atrial fibrillation: Secondary | ICD-10-CM

## 2017-06-05 LAB — POCT INR: INR: 3.2 — AB (ref 2.0–3.0)

## 2017-06-05 NOTE — Patient Instructions (Addendum)
Description   Tomorrow take 1/2 tablet then continue on same dosage 1 tablet daily except 1.5 tablets on Mondays. Recheck INR in 3 weeks. Call our office if you are placed on any new medications or if you have any concerns [phone].

## 2017-06-11 ENCOUNTER — Other Ambulatory Visit: Payer: Self-pay | Admitting: Family Medicine

## 2017-06-12 ENCOUNTER — Encounter: Payer: Self-pay | Admitting: Cardiology

## 2017-06-12 ENCOUNTER — Encounter (INDEPENDENT_AMBULATORY_CARE_PROVIDER_SITE_OTHER): Payer: Self-pay

## 2017-06-12 ENCOUNTER — Ambulatory Visit: Payer: Medicare Other | Admitting: Cardiology

## 2017-06-12 VITALS — BP 158/68 | HR 56 | Ht 59.6 in | Wt 94.4 lb

## 2017-06-12 DIAGNOSIS — I10 Essential (primary) hypertension: Secondary | ICD-10-CM

## 2017-06-12 DIAGNOSIS — E782 Mixed hyperlipidemia: Secondary | ICD-10-CM

## 2017-06-12 DIAGNOSIS — Z7901 Long term (current) use of anticoagulants: Secondary | ICD-10-CM

## 2017-06-12 DIAGNOSIS — I481 Persistent atrial fibrillation: Secondary | ICD-10-CM | POA: Diagnosis not present

## 2017-06-12 DIAGNOSIS — G509 Disorder of trigeminal nerve, unspecified: Secondary | ICD-10-CM | POA: Diagnosis not present

## 2017-06-12 DIAGNOSIS — M542 Cervicalgia: Secondary | ICD-10-CM | POA: Diagnosis not present

## 2017-06-12 DIAGNOSIS — R51 Headache: Secondary | ICD-10-CM | POA: Diagnosis not present

## 2017-06-12 DIAGNOSIS — M316 Other giant cell arteritis: Secondary | ICD-10-CM | POA: Diagnosis not present

## 2017-06-12 DIAGNOSIS — G500 Trigeminal neuralgia: Secondary | ICD-10-CM | POA: Diagnosis not present

## 2017-06-12 DIAGNOSIS — I4819 Other persistent atrial fibrillation: Secondary | ICD-10-CM

## 2017-06-12 MED ORDER — FUROSEMIDE 20 MG PO TABS: 20.0000 mg | ORAL_TABLET | Freq: Every day | ORAL | 6 refills | Status: DC | PRN

## 2017-06-12 MED ORDER — METOPROLOL TARTRATE 25 MG PO TABS: 25.0000 mg | ORAL_TABLET | Freq: Two times a day (BID) | ORAL | 1 refills | Status: DC

## 2017-06-12 NOTE — Patient Instructions (Signed)

## 2017-06-12 NOTE — Progress Notes (Signed)
06/12/2017 [name] [name]   06-19-1924  [id number]  Primary Physician Mosie Lukes, MD Primary Cardiologist: Dr. [name]    Reason for Visit/CC: F/u for Atrial Fibrillation   HPI:  [name] [name] is a 84 y.o. female who is being seen today for f/u for chronic atrial fibrillation. She is followed by Dr. [name] and is on chronic a/c with coumadin. Her INRs are followed in our coumadin clinic. She also has a h/o mild AI and mild MR w/ MVP. Her last 2D echo in 2014 showed normal LVEF at 55-60%. She also has a h/o treated HTN and HLD. Her last OV with Dr. [name] was 11/2016. She was stable and instructed to f/u in 1 year.  She presents to clinic a few months early. She has concerns regarding increase in HR and more frequent palpitations. She is currently asymptomatic. HF in the mid 70s. She reports full compliance with metoprolol but is on low dose, 12.5 mg BID. Her BP is elevated at 160/80. She avoids caffeine. No ETOH. She admits that she does not drink plenty of fluids.   She was in the hospital in December 2018 with chest pain, she was ruled out for acute coronary syndrome, her echocardiogram showed normal LV EF 60-65%, only mild mitral and tricuspid regurgitation only mild pulmonary hypertension.  06/10/2017 - the patient is coming after 4 months, she feels and looks great, she denies any chest pain or shortness of breath, she continues to walk without a walker or a cane. She has no orthopnea paroxysmal nocturnal dyspnea. She doesn't have any memory impairment. No bleeding with Coumadin. No recent palpitations, no dizziness or falls. At the last visit she was given Lasix 20 mg po daily PRN that has been working for her, currently has no edema.  Current Meds  Medication Sig  . ALPRAZolam (XANAX) 0.25 MG tablet Take 0.5-1 tablets (0.125-0.25 mg total) by mouth 2 (two) times daily as needed for anxiety.  Marland Kitchen azelastine (ASTELIN) 0.1 % nasal spray Place 2 sprays into both nostrils 2 (two) times  daily. Use in each nostril as directed  . Cholecalciferol (VITAMIN D) 2000 UNITS CAPS Take 2,000 Units by mouth every other day.  . Cyanocobalamin (VITAMIN B-12) 500 MCG SUBL Place 1 tablet (500 mcg total) under the tongue daily at 2 PM.  . furosemide (LASIX) 20 MG tablet Take 1 tablet (20 mg total) by mouth daily as needed.  . metoprolol tartrate (LOPRESSOR) 25 MG tablet TAKE 1 TABLET BY MOUTH TWICE A DAY  . pantoprazole (PROTONIX) 40 MG tablet TAKE 1 TABLET (40 MG TOTAL) BY MOUTH DAILY.  . ramipril (ALTACE) 10 MG capsule Take 1 capsule (10 mg total) by mouth 2 (two) times daily.  . ramipril (ALTACE) 10 MG capsule TAKE 1 CAPSULE (10 MG TOTAL) BY MOUTH DAILY.  . ranitidine (ZANTAC) 150 MG tablet Take 1-2 tablets (150-300 mg total) by mouth 2 (two) times daily.  Marland Kitchen warfarin (COUMADIN) 2.5 MG tablet TAKE AS DIRECTED BY ANTICOAGULATION CLINIC. A 30 DAY SUPPLY   Allergies  Allergen Reactions  . Darifenacin Hydrobromide     REACTION: causes her dizziness  . Sulfonamide Derivatives Other (See Comments)    Pt thinks it caused dizziness  . Tramadol Other (See Comments)    Insomnia, anorexia   Past Medical History:  Diagnosis Date  . Anemia   . Anxiety   . Arthritis    hips, knees  . Atrial fibrillation ([place])   . Current use of long  term anticoagulation   . Diverticulitis of colon 02/06/2015  . Diverticulosis   . Gait difficulty   . GERD (gastroesophageal reflux disease)   . Headache(784.0) 06/22/2012  . Hyperlipidemia   . Hypertension   . IBS (irritable bowel syndrome)   . Incontinence   . Loss of weight 06/05/2015  . Medicare annual wellness visit, subsequent 02/05/2014  . Melena   . Mitral and aortic regurgitation   . Mitral valve prolapse    hx of  . Osteopenia   . Pedal edema 10/01/2016  . Personal history of colonic polyps   . Renal lithiasis 06/26/2015  . Thyroid disease    hypo  . Unspecified adverse effect of other drug, medicinal and biological substance(995.29)   . Vitamin  B12 deficiency 03/16/2016  . Vitamin D deficiency    Family History  Problem Relation Age of Onset  . Diabetes Mother   . CVA Mother   . Stroke Mother   . COPD Father   . Rheum arthritis Father   . Allergies Daughter   . COPD Daughter   . Stroke Daughter   . COPD Daughter        previous smoker  . Stroke Maternal Grandfather   . Heart disease Maternal Aunt   . Heart disease Maternal Uncle   . Colon cancer Neg Hx   . Colon polyps Neg Hx   . Esophageal cancer Neg Hx   . Gallbladder disease Neg Hx   . Kidney disease Neg Hx   . Heart attack Neg Hx   . Hypertension Neg Hx    Past Surgical History:  Procedure Laterality Date  . BOTOX INJECTION N/A 09/03/2012   Procedure: BOTOX INJECTION;  Surgeon: Inda Castle, MD;  Location: [facility];  Service: Endoscopy;  Laterality: N/A;  . BREAST BIOPSY Left   . BREAST LUMPECTOMY Right 08/18/2014   Procedure: RIGHT BREAST LUMPECTOMY;  Surgeon: Coralie Keens, MD;  Location: [place];  Service: General;  Laterality: Right;  . CARDIAC ELECTROPHYSIOLOGY MAPPING AND ABLATION    . CATARACT EXTRACTION, BILATERAL    . ESOPHAGOGASTRODUODENOSCOPY N/A 09/03/2012   Procedure: ESOPHAGOGASTRODUODENOSCOPY (EGD);  Surgeon: Inda Castle, MD;  Location: [facility];  Service: Endoscopy;  Laterality: N/A;  . ESOPHAGOSCOPY W/ BOTOX INJECTION    . I&D EXTREMITY Right 11/06/2012   Procedure: IRRIGATION AND DEBRIDEMENT EXTREMITY Right Ring Finger;  Surgeon: Tennis Must, MD;  Location: [place];  Service: Orthopedics;  Laterality: Right;  . PARTIAL HYSTERECTOMY     ovaries left in place   Social History   Socioeconomic History  . Marital status: Widowed    Spouse name: Not on file  . Number of children: 5  . Years of education: Not on file  . Highest education level: Not on file  Occupational History  . Occupation: retired    Fish farm manager: RETIRED  Social Needs  . Financial resource strain: Not on file  . Food insecurity:    Worry:  Not on file    Inability: Not on file  . Transportation needs:    Medical: Not on file    Non-medical: Not on file  Tobacco Use  . Smoking status: Never Smoker  . Smokeless tobacco: Never Used  Substance and Sexual Activity  . Alcohol use: No  . Drug use: No  . Sexual activity: Not on file    Comment: lives with Daughter, grandson and his family. no dietary restricitons.  Lifestyle  . Physical activity:    Days  per week: Not on file    Minutes per session: Not on file  . Stress: Not on file  Relationships  . Social connections:    Talks on phone: Not on file    Gets together: Not on file    Attends religious service: Not on file    Active member of club or organization: Not on file    Attends meetings of clubs or organizations: Not on file    Relationship status: Not on file  . Intimate partner violence:    Fear of current or ex partner: Not on file    Emotionally abused: Not on file    Physically abused: Not on file    Forced sexual activity: Not on file  Other Topics Concern  . Not on file  Social History Narrative  . Not on file     Review of Systems: General: negative for chills, fever, night sweats or weight changes.  Cardiovascular: negative for chest pain, dyspnea on exertion, edema, orthopnea, palpitations, paroxysmal nocturnal dyspnea or shortness of breath Dermatological: negative for rash Respiratory: negative for cough or wheezing Urologic: negative for hematuria Abdominal: negative for nausea, vomiting, diarrhea, bright red blood per rectum, melena, or hematemesis Neurologic: negative for visual changes, syncope, or dizziness All other systems reviewed and are otherwise negative except as noted above.   Physical Exam:  Blood pressure (!) 158/68, pulse (!) 56, height 4' 11.6" (1.514 m), weight 94 lb 6.4 oz (42.8 kg), SpO2 96 %.  General appearance: alert, cooperative and no distress Neck: no carotid bruit and no JVD Lungs: clear to auscultation  bilaterally Heart: irregularly irregular rhythm and regular rate Extremities: extremities normal, atraumatic, no cyanosis, mild pitting bilateral edema around the ankles. Pulses: 2+ and symmetric Skin: Skin color, texture, turgor normal. No rashes or lesions Neurologic: Grossly normal  EKG performed, it shows sinus bradycardia, otherwise normal ECG -- personally reviewed     ASSESSMENT AND PLAN:   1. Paroxysmal Afib/Increased Palpitations: Her last EKGs show sinus rhythm with first-degree AV block, she has occasional palpitations mostly at night but no dizziness or syncope. Her baseline rate is in 50s. Will continue the same management, she has no bleeding with Coumadin.  2. HTN:  Minimally elevated, I would continue the same management considering her age and risk of fall.   3. Mild pulmonary hypertension - this is unchanged in several years and is asymptomatic.  4. Mild lower extremity edema - continue 20 mg daily PRN.  Follow-Up w/ Dr. [name] in 6 months.   Ena Dawley, MD [facility] 06/12/2017 10:32 AM

## 2017-06-14 ENCOUNTER — Other Ambulatory Visit: Payer: Self-pay

## 2017-06-14 ENCOUNTER — Other Ambulatory Visit (INDEPENDENT_AMBULATORY_CARE_PROVIDER_SITE_OTHER): Payer: Medicare Other

## 2017-06-14 ENCOUNTER — Ambulatory Visit (INDEPENDENT_AMBULATORY_CARE_PROVIDER_SITE_OTHER): Payer: Medicare Other | Admitting: Internal Medicine

## 2017-06-14 VITALS — BP 155/66 | HR 50

## 2017-06-14 DIAGNOSIS — I10 Essential (primary) hypertension: Secondary | ICD-10-CM

## 2017-06-14 LAB — COMPREHENSIVE METABOLIC PANEL
ALBUMIN: 3.6 g/dL (ref 3.5–5.2)
ALT: 8 U/L (ref 0–35)
AST: 12 U/L (ref 0–37)
Alkaline Phosphatase: 15 U/L — ABNORMAL LOW (ref 39–117)
BUN: 16 mg/dL (ref 6–23)
CO2: 31 mEq/L (ref 19–32)
Calcium: 9 mg/dL (ref 8.4–10.5)
Chloride: 97 mEq/L (ref 96–112)
Creatinine, Ser: 1 mg/dL (ref 0.40–1.20)
GFR: 55.18 mL/min — ABNORMAL LOW (ref >60.00)
GLUCOSE: 69 mg/dL — AB (ref 70–99)
POTASSIUM: 4.6 meq/L (ref 3.5–5.1)
SODIUM: 134 meq/L — AB (ref 135–145)
Total Bilirubin: 0.4 mg/dL (ref 0.2–1.2)
Total Protein: 6.5 g/dL (ref 6.0–8.3)

## 2017-06-14 MED ORDER — RAMIPRIL 10 MG PO CAPS: 10.0000 mg | ORAL_CAPSULE | Freq: Two times a day (BID) | ORAL | 3 refills | Status: DC

## 2017-06-14 NOTE — Progress Notes (Addendum)
Pre visit review using our clinic tool,if applicable. No additional management support is needed unless otherwise documented below in the visit note.   Pt here for Blood pressure check per order from Dr. Charlett Blake dated 05/09/2017.  Pt currently takes: Lasix 20 mg prn,Metoprplol 25 mg 2 times daily, and Ramipril 10 mg 2 times daily.  No complaints voiced this visit.  Pt reports compliance with medication.  BP today @ = 155/66 HR =50  Pt advised per Dr. Larose Kells continue taking BP medications as ordered follow up with Dr. Charlett Blake as scheduled. Have CMP drawn today as ordered.  Kathlene November, MD

## 2017-06-20 ENCOUNTER — Emergency Department (HOSPITAL_BASED_OUTPATIENT_CLINIC_OR_DEPARTMENT_OTHER): Payer: Medicare Other

## 2017-06-20 ENCOUNTER — Other Ambulatory Visit: Payer: Self-pay

## 2017-06-20 ENCOUNTER — Emergency Department (HOSPITAL_BASED_OUTPATIENT_CLINIC_OR_DEPARTMENT_OTHER)
Admission: EM | Admit: 2017-06-20 | Discharge: 2017-06-20 | Disposition: A | Discharge status: Home / Self Care | Payer: Medicare Other | Attending: Emergency Medicine | Admitting: Emergency Medicine

## 2017-06-20 ENCOUNTER — Encounter (HOSPITAL_BASED_OUTPATIENT_CLINIC_OR_DEPARTMENT_OTHER): Payer: Self-pay | Admitting: *Deleted

## 2017-06-20 DIAGNOSIS — Z79899 Other long term (current) drug therapy: Secondary | ICD-10-CM | POA: Insufficient documentation

## 2017-06-20 DIAGNOSIS — E039 Hypothyroidism, unspecified: Secondary | ICD-10-CM | POA: Insufficient documentation

## 2017-06-20 DIAGNOSIS — R1013 Epigastric pain: Secondary | ICD-10-CM | POA: Diagnosis not present

## 2017-06-20 DIAGNOSIS — R079 Chest pain, unspecified: Secondary | ICD-10-CM | POA: Diagnosis not present

## 2017-06-20 DIAGNOSIS — R1010 Upper abdominal pain, unspecified: Secondary | ICD-10-CM | POA: Insufficient documentation

## 2017-06-20 DIAGNOSIS — R0789 Other chest pain: Secondary | ICD-10-CM | POA: Diagnosis not present

## 2017-06-20 DIAGNOSIS — I10 Essential (primary) hypertension: Secondary | ICD-10-CM | POA: Insufficient documentation

## 2017-06-20 DIAGNOSIS — Z7901 Long term (current) use of anticoagulants: Secondary | ICD-10-CM | POA: Insufficient documentation

## 2017-06-20 LAB — COMPREHENSIVE METABOLIC PANEL
ALBUMIN: 3.4 g/dL — AB (ref 3.5–5.0)
ALT: 11 U/L — AB (ref 14–54)
AST: 19 U/L (ref 15–41)
Alkaline Phosphatase: 18 U/L — ABNORMAL LOW (ref 38–126)
Anion gap: 9 (ref 5–15)
BUN: 17 mg/dL (ref 6–20)
CHLORIDE: 100 mmol/L — AB (ref 101–111)
CO2: 25 mmol/L (ref 22–32)
CREATININE: 1.11 mg/dL — AB (ref 0.44–1.00)
Calcium: 8.7 mg/dL — ABNORMAL LOW (ref 8.9–10.3)
GFR calc Af Amer: 49 mL/min — ABNORMAL LOW (ref >60)
GFR calc non Af Amer: 42 mL/min — ABNORMAL LOW (ref >60)
GLUCOSE: 106 mg/dL — AB (ref 65–99)
Potassium: 5.1 mmol/L (ref 3.5–5.1)
Sodium: 134 mmol/L — ABNORMAL LOW (ref 135–145)
Total Bilirubin: 0.2 mg/dL — ABNORMAL LOW (ref 0.3–1.2)
Total Protein: 7.1 g/dL (ref 6.5–8.1)

## 2017-06-20 LAB — CBC
HCT: 35 % — ABNORMAL LOW (ref 36.0–46.0)
HEMOGLOBIN: 11.8 g/dL — AB (ref 12.0–15.0)
MCH: 29.9 pg (ref 26.0–34.0)
MCHC: 33.7 g/dL (ref 30.0–36.0)
MCV: 88.6 fL (ref 78.0–100.0)
PLATELETS: 204 10*3/uL (ref 150–400)
RBC: 3.95 MIL/uL (ref 3.87–5.11)
RDW: 14.4 % (ref 11.5–15.5)
WBC: 4.5 10*3/uL (ref 4.0–10.5)

## 2017-06-20 LAB — PROTIME-INR
INR: 2.38
Prothrombin Time: 25.8 seconds — ABNORMAL HIGH (ref 11.4–15.2)

## 2017-06-20 LAB — TROPONIN I

## 2017-06-20 LAB — LIPASE, BLOOD: Lipase: 40 U/L (ref 11–51)

## 2017-06-20 MED ORDER — IOPAMIDOL (ISOVUE-370) INJECTION 76%
100.0000 mL | Freq: Once | INTRAVENOUS | Status: AC | PRN
  Administered 2017-06-20: 75 mL via INTRAVENOUS

## 2017-06-20 NOTE — ED Triage Notes (Signed)
Pt taken directly to room 10 for ekg and monitoring after vs checked at triage. Pt triaged at bedside while ekg being performed. Pt reports chest pain "all over my chest" x Monday, today pain is only to epigastric area and radiating through to her back. Pt states pain last present around 3pm, none since then, no pain at this time. Denies sob, nausea, diaphoresis or any other c/o.

## 2017-06-20 NOTE — ED Notes (Signed)
Pt eating peanut butter gram crackers with Orange juice

## 2017-06-20 NOTE — ED Notes (Signed)
Patient transported to CT 

## 2017-06-23 NOTE — ED Provider Notes (Signed)
[place] EMERGENCY DEPARTMENT Provider Note   CSN: [id number] Arrival date & time: 06/20/17  1906     History   Chief Complaint Chief Complaint  Patient presents with  . Chest Pain    HPI [name] [name] is a 84 y.o. female.  HPI Patient is a 84 year old female presents the emergency department with discomfort in her chest and her upper abdomen with radiation through to her back.  She denies weakness of her arms or legs.  Patient's pain was not acute in onset.  Her symptoms feel like they are improving at this time.  She is no longer having chest pain.  She reports mild abdominal pain.  Denies nausea vomiting.  No diaphoresis.  No shortness of breath.  No fevers or chills.  No urinary complaints.  She has a history of hypertension hyperlipidemia as well as IBS and atrial fibrillation.   Past Medical History:  Diagnosis Date  . Anemia   . Anxiety   . Arthritis    hips, knees  . Atrial fibrillation ([place])   . Current use of long term anticoagulation   . Diverticulitis of colon 02/06/2015  . Diverticulosis   . Gait difficulty   . GERD (gastroesophageal reflux disease)   . Headache(784.0) 06/22/2012  . Hyperlipidemia   . Hypertension   . IBS (irritable bowel syndrome)   . Incontinence   . Loss of weight 06/05/2015  . Medicare annual wellness visit, subsequent 02/05/2014  . Melena   . Mitral and aortic regurgitation   . Mitral valve prolapse    hx of  . Osteopenia   . Pedal edema 10/01/2016  . Personal history of colonic polyps   . Renal lithiasis 06/26/2015  . Thyroid disease    hypo  . Unspecified adverse effect of other drug, medicinal and biological substance(995.29)   . Vitamin B12 deficiency 03/16/2016  . Vitamin D deficiency     Patient Active Problem List   Diagnosis Date Noted  . Chest pain 12/10/2016  . Pedal edema 10/01/2016  . Preventative health care 03/18/2016  . Vitamin B12 deficiency 03/16/2016  . Renal lithiasis 06/26/2015  . Loss of weight  06/05/2015  . Diverticulitis of colon 02/06/2015  . Right-sided thoracic back pain 11/15/2014  . Nipple discharge 06/21/2014  . Rib pain on right side 04/29/2014  . Medicare annual wellness visit, subsequent 02/05/2014  . Abdominal pain, chronic, right lower quadrant 02/03/2014  . Rectal bleeding 12/03/2013  . Prolapse of female pelvic organs 12/03/2013  . Leukocytes in urine 11/25/2013  . Abnormal chest x-ray 10/23/2013  . Pulmonary hypertension ([place]) 10/09/2013  . Tricuspid regurgitation 10/09/2013  . Chronic anticoagulation 10/09/2013  . Hyperglycemia 08/24/2013  . Encounter for therapeutic drug monitoring 03/13/2013  . Renal insufficiency 06/28/2012  . Hyperkalemia 06/28/2012  . Headache 06/22/2012  . Sinus bradycardia 09/20/2011  . Increased urinary frequency 02/13/2011  . Arthritis 11/13/2010  . Long term (current) use of anticoagulants 03/31/2010  . DIZZINESS 08/03/2009  . ACHALASIA 05/18/2009  . DYSPHAGIA UNSPECIFIED 04/11/2009  . Anemia 01/19/2009  . ESOPHAGEAL STRICTURE 01/19/2009  . Anxiety state 09/15/2008  . MITRAL REGURGITATION, MILD 08/20/2008  . PAROXYSMAL ATRIAL FIBRILLATION 08/20/2008  . MITRAL VALVE PROLAPSE, HX OF 08/20/2008  . Vitamin D deficiency 01/15/2008  . IBS 02/03/2007  . Hypothyroidism 01/29/2007  . Hyperlipidemia, mixed 01/29/2007  . Essential hypertension 01/29/2007  . GERD 01/29/2007  . Disorder of bone and cartilage 01/29/2007  . COLONIC POLYPS, HX OF 01/29/2007    Past  Surgical History:  Procedure Laterality Date  . BOTOX INJECTION N/A 09/03/2012   Procedure: BOTOX INJECTION;  Surgeon: Inda Castle, MD;  Location: [facility];  Service: Endoscopy;  Laterality: N/A;  . BREAST BIOPSY Left   . BREAST LUMPECTOMY Right 08/18/2014   Procedure: RIGHT BREAST LUMPECTOMY;  Surgeon: Coralie Keens, MD;  Location: [facility];  Service: General;  Laterality: Right;  . CARDIAC ELECTROPHYSIOLOGY MAPPING AND ABLATION    . CATARACT  EXTRACTION, BILATERAL    . ESOPHAGOGASTRODUODENOSCOPY N/A 09/03/2012   Procedure: ESOPHAGOGASTRODUODENOSCOPY (EGD);  Surgeon: Inda Castle, MD;  Location: [facility];  Service: Endoscopy;  Laterality: N/A;  . ESOPHAGOSCOPY W/ BOTOX INJECTION    . I&D EXTREMITY Right 11/06/2012   Procedure: IRRIGATION AND DEBRIDEMENT EXTREMITY Right Ring Finger;  Surgeon: Tennis Must, MD;  Location: [facility];  Service: Orthopedics;  Laterality: Right;  . PARTIAL HYSTERECTOMY     ovaries left in place     OB History   None      Home Medications    Prior to Admission medications   Medication Sig Start Date End Date Taking? Authorizing Provider  ALPRAZolam (XANAX) 0.25 MG tablet Take 0.5-1 tablets (0.125-0.25 mg total) by mouth 2 (two) times daily as needed for anxiety. 05/09/17   Mosie Lukes, MD  azelastine (ASTELIN) 0.1 % nasal spray Place 2 sprays into both nostrils 2 (two) times daily. Use in each nostril as directed 05/09/17   Mosie Lukes, MD  Cholecalciferol (VITAMIN D) 2000 UNITS CAPS Take 2,000 Units by mouth every other day. 02/20/12   Mosie Lukes, MD  Cyanocobalamin (VITAMIN B-12) 500 MCG SUBL Place 1 tablet (500 mcg total) under the tongue daily at 2 PM. 06/22/16   Mosie Lukes, MD  furosemide (LASIX) 20 MG tablet Take 1 tablet (20 mg total) by mouth daily as needed. 06/12/17   Dorothy Spark, MD  metoprolol tartrate (LOPRESSOR) 25 MG tablet Take 1 tablet (25 mg total) by mouth 2 (two) times daily. 06/12/17   Dorothy Spark, MD  pantoprazole (PROTONIX) 40 MG tablet TAKE 1 TABLET (40 MG TOTAL) BY MOUTH DAILY. 03/25/17   Mosie Lukes, MD  ramipril (ALTACE) 10 MG capsule TAKE 1 CAPSULE (10 MG TOTAL) BY MOUTH DAILY. 06/11/17   Mosie Lukes, MD  ramipril (ALTACE) 10 MG capsule Take 1 capsule (10 mg total) by mouth 2 (two) times daily. 06/14/17   Mosie Lukes, MD  ranitidine (ZANTAC) 150 MG tablet Take 1-2 tablets (150-300 mg total) by mouth 2 (two) times daily. 05/09/17   Mosie Lukes, MD  warfarin (COUMADIN) 2.5 MG tablet TAKE AS DIRECTED BY ANTICOAGULATION CLINIC. A 30 DAY SUPPLY 11/07/16   Dorothy Spark, MD    Family History Family History  Problem Relation Age of Onset  . Diabetes Mother   . CVA Mother   . Stroke Mother   . COPD Father   . Rheum arthritis Father   . Allergies Daughter   . COPD Daughter   . Stroke Daughter   . COPD Daughter        previous smoker  . Stroke Maternal Grandfather   . Heart disease Maternal Aunt   . Heart disease Maternal Uncle   . Colon cancer Neg Hx   . Colon polyps Neg Hx   . Esophageal cancer Neg Hx   . Gallbladder disease Neg Hx   . Kidney disease Neg Hx   . Heart attack Neg  Hx   . Hypertension Neg Hx     Social History Social History   Tobacco Use  . Smoking status: Never Smoker  . Smokeless tobacco: Never Used  Substance Use Topics  . Alcohol use: No  . Drug use: No     Allergies   Darifenacin hydrobromide; Sulfonamide derivatives; and Tramadol   Review of Systems Review of Systems  All other systems reviewed and are negative.    Physical Exam Updated Vital Signs BP (!) 150/55   Pulse (!) 51   Temp 98.3 F (36.8 C) (Oral)   Resp (!) 22   SpO2 96%   Physical Exam  Constitutional: She is oriented to person, place, and time. She appears well-developed and well-nourished. No distress.  HENT:  Head: Normocephalic and atraumatic.  Eyes: EOM are normal.  Neck: Normal range of motion.  Cardiovascular: Normal rate, regular rhythm and normal heart sounds.  Pulmonary/Chest: Effort normal and breath sounds normal.  Abdominal: Soft. She exhibits no distension. There is no tenderness.  Musculoskeletal: Normal range of motion.  Neurological: She is alert and oriented to person, place, and time.  Skin: Skin is warm and dry.  Psychiatric: She has a normal mood and affect. Judgment normal.  Nursing note and vitals reviewed.    ED Treatments / Results  Labs (all labs ordered are  listed, but only abnormal results are displayed) Labs Reviewed  CBC - Abnormal; Notable for the following components:      Result Value   Hemoglobin 11.8 (*)    HCT 35.0 (*)    All other components within normal limits  COMPREHENSIVE METABOLIC PANEL - Abnormal; Notable for the following components:   Sodium 134 (*)    Chloride 100 (*)    Glucose, Bld 106 (*)    Creatinine, Ser 1.11 (*)    Calcium 8.7 (*)    Albumin 3.4 (*)    ALT 11 (*)    Alkaline Phosphatase 18 (*)    Total Bilirubin 0.2 (*)    GFR calc non Af Amer 42 (*)    GFR calc Af Amer 49 (*)    All other components within normal limits  PROTIME-INR - Abnormal; Notable for the following components:   Prothrombin Time 25.8 (*)    All other components within normal limits  TROPONIN I  LIPASE, BLOOD    EKG EKG Interpretation  Date/Time:  Thursday June 20 2017 19:18:14 EDT Ventricular Rate:  67 PR Interval:    QRS Duration: 96 QT Interval:  408 QTC Calculation: 380 R Axis:   10 Text Interpretation:  Atrial flutter with predominant 4:1 AV block Probable left ventricular hypertrophy No significant change was found Confirmed by Jola Schmidt [phone]) on 06/20/2017 8:03:52 PM   Radiology No results found.  Procedures Procedures (including critical care time)  Medications Ordered in ED Medications  iopamidol (ISOVUE-370) 76 % injection 100 mL (75 mLs Intravenous Contrast Given 06/20/17 2037)     Initial Impression / Assessment and Plan / ED Course  I have reviewed the triage vital signs and the nursing notes.  Pertinent labs & imaging results that were available during my care of the patient were reviewed by me and considered in my medical decision making (see chart for details).    Patient feeling better at this time given her chest pain with radiation through to her back she underwent CT angiogram of her chest abdomen pelvis to evaluate for dissection.  No signs of dissection found.  Patient was informed  of the  16 mm hyperenhancing splenic mass will need outpatient follow-up and additional testing for this.  Overall well-appearing.  I do not think she needs additional work-up at this time.  She is stable for discharge from the emergency department.  Primary care follow-up.  She understands return to the ER for new or worsening symptoms  Final Clinical Impressions(s) / ED Diagnoses   Final diagnoses:  Chest pain, unspecified type  Upper abdominal pain    ED Discharge Orders    None       Jola Schmidt, MD 06/23/17 [phone]

## 2017-06-27 ENCOUNTER — Encounter: Payer: Self-pay | Admitting: Family

## 2017-06-27 ENCOUNTER — Ambulatory Visit: Payer: Self-pay | Admitting: *Deleted

## 2017-06-27 ENCOUNTER — Ambulatory Visit (INDEPENDENT_AMBULATORY_CARE_PROVIDER_SITE_OTHER): Payer: Medicare Other | Admitting: Family

## 2017-06-27 VITALS — BP 170/61 | HR 50 | Resp 16 | Ht 59.0 in | Wt 93.8 lb

## 2017-06-27 DIAGNOSIS — D739 Disease of spleen, unspecified: Secondary | ICD-10-CM

## 2017-06-27 DIAGNOSIS — R1013 Epigastric pain: Secondary | ICD-10-CM

## 2017-06-27 DIAGNOSIS — D7389 Other diseases of spleen: Secondary | ICD-10-CM

## 2017-06-27 DIAGNOSIS — R630 Anorexia: Secondary | ICD-10-CM

## 2017-06-27 NOTE — Patient Instructions (Addendum)
Stop Ranitidine, start protonix. Complete lab work prior to leaving  Call if new/worsening symptoms.

## 2017-06-27 NOTE — Telephone Encounter (Signed)
Pt's daughter calling, pt present during call. Reports seen in ED 06/20/17, chest pain, upper abdominal pain, radiating to back. CT angiogram of chest, abdomen, pelvis done and pt D/Ced to home. Daughter reports pain remains and presently "Eating and drinking very little." Had 1 episode of loose stool yesterday. Pt states pain is "Same place, underneath ribs, below breastbone, across upper stomach."  Intermittent, not worsening, shooting at times 7/10 when occurs. States Mylanta recommended by ED MD helping. Denies fever, nausea, vomiting. Pt states urine light "Regular color, not dark." Voided x 3 this am.  NT called practice, spoke with Raquel Sarna; secured appt with M. Inda Castle for today.  Pt instructed to stay hydrated. Care advise given per protocol, instructed to go to ED if symptoms worsen, signs of dehydration occur, dizziness, CP.   Reason for Disposition . [1] MODERATE pain (e.g., interferes with normal activities) AND [2] pain comes and goes (cramps) AND [3] present > 24 hours  (Exception: pain with Vomiting or Diarrhea - see that Guideline)  Answer Assessment - Initial Assessment Questions 1. LOCATION: "Where does it hurt?"      Under rib cage across entire stomach 2. RADIATION: "Does the pain shoot anywhere else?" (e.g., chest, back)    Shoots to  back area at times, not presently 3. ONSET: "When did the pain begin?" (e.g., minutes, hours or days ago)      3 weeks ago 4. SUDDEN: "Gradual or sudden onset?"     Gradual 5. PATTERN "Does the pain come and go, or is it constant?"    - If constant: "Is it getting better, staying the same, or worsening?"      (Note: Constant means the pain never goes away completely; most serious pain is constant and it progresses)     - If intermittent: "How long does it last?" "Do you have pain now?"     (Note: Intermittent means the pain goes away completely between bouts)     Intermittent 6. SEVERITY: "How bad is the pain?"  (e.g., Scale 1-10; mild,  moderate, or severe)   - MILD (1-3): doesn't interfere with normal activities, abdomen soft and not tender to touch    - MODERATE (4-7): interferes with normal activities or awakens from sleep, tender to touch    - SEVERE (8-10): excruciating pain, doubled over, unable to do any normal activities      7/10 when "Shooting"  intermittent 7. RECURRENT SYMPTOM: "Have you ever had this type of abdominal pain before?" If so, ask: "When was the last time?" and "What happened that time?"      Yes. ED visit 06/20/17 8. CAUSE: "What do you think is causing the abdominal pain?"     Unsure  9. RELIEVING/AGGRAVATING FACTORS: "What makes it better or worse?" (e.g., movement, antacids, bowel movement)     Mylanta 10. OTHER SYMPTOMS: "Has there been any vomiting, diarrhea, constipation, or urine problems?"       Loose stool,1 episode yesterday, not drinking, eating much  Protocols used: ABDOMINAL PAIN - [facility]

## 2017-06-27 NOTE — Progress Notes (Signed)
Subjective:    Patient ID: [name] [name], female    DOB: 1924-06-22, 84 y.o.   MRN: [id number]  HPI  Patient is a 84 year old female who presents today for ER follow-up.  She was seen in the emergency department on 06/20/2017.  ER record is reviewed.  During her ER visit she underwent a CT to rule out aortic dissection due to her chest pain.  CT was negative for aortic dissection.  Incidental note was made of a possible 16 mm hyperenhancing splenic mass.  She reports that she has been continuing Zantac 150 mg twice daily as prescribed by her primary care provider.  Since her trip to the emergency department however she has dropped down to once a day as she thought that this might have caused her symptoms.  Her daughter notes that she has fairly severe reflux symptoms which have been partially alleviated by elevating the head of her bed.  Today she denies any epigastric pain.  She reports that she "just feels bad."  Denies chest pain.  She notes anorexia. Reports that she "just feels bad."  She reports that her mood is good and denies any current concerns about depression.  Although Protonix is on her medication list, she states she has not been taking it. Lab Results  Component Value Date   TSH 2.56 05/09/2017   Wt Readings from Last 3 Encounters:  06/27/17 93 lb 12.8 oz (42.5 kg)  06/12/17 94 lb 6.4 oz (42.8 kg)  05/09/17 94 lb (42.6 kg)     Review of Systems    see HPI  Past Medical History:  Diagnosis Date  . Anemia   . Anxiety   . Arthritis    hips, knees  . Atrial fibrillation ([place])   . Current use of long term anticoagulation   . Diverticulitis of colon 02/06/2015  . Diverticulosis   . Gait difficulty   . GERD (gastroesophageal reflux disease)   . Headache(784.0) 06/22/2012  . Hyperlipidemia   . Hypertension   . IBS (irritable bowel syndrome)   . Incontinence   . Loss of weight 06/05/2015  . Medicare annual wellness visit, subsequent 02/05/2014  . Melena   . Mitral and  aortic regurgitation   . Mitral valve prolapse    hx of  . Osteopenia   . Pedal edema 10/01/2016  . Personal history of colonic polyps   . Renal lithiasis 06/26/2015  . Thyroid disease    hypo  . Unspecified adverse effect of other drug, medicinal and biological substance(995.29)   . Vitamin B12 deficiency 03/16/2016  . Vitamin D deficiency      Social History   Socioeconomic History  . Marital status: Widowed    Spouse name: Not on file  . Number of children: 5  . Years of education: Not on file  . Highest education level: Not on file  Occupational History  . Occupation: retired    Fish farm manager: RETIRED  Social Needs  . Financial resource strain: Not on file  . Food insecurity:    Worry: Not on file    Inability: Not on file  . Transportation needs:    Medical: Not on file    Non-medical: Not on file  Tobacco Use  . Smoking status: Never Smoker  . Smokeless tobacco: Never Used  Substance and Sexual Activity  . Alcohol use: No  . Drug use: No  . Sexual activity: Not on file    Comment: lives with Daughter, grandson and his family. no  dietary restricitons.  Lifestyle  . Physical activity:    Days per week: Not on file    Minutes per session: Not on file  . Stress: Not on file  Relationships  . Social connections:    Talks on phone: Not on file    Gets together: Not on file    Attends religious service: Not on file    Active member of club or organization: Not on file    Attends meetings of clubs or organizations: Not on file    Relationship status: Not on file  . Intimate partner violence:    Fear of current or ex partner: Not on file    Emotionally abused: Not on file    Physically abused: Not on file    Forced sexual activity: Not on file  Other Topics Concern  . Not on file  Social History Narrative  . Not on file    Past Surgical History:  Procedure Laterality Date  . BOTOX INJECTION N/A 09/03/2012   Procedure: BOTOX INJECTION;  Surgeon: Inda Castle,  MD;  Location: [facility];  Service: Endoscopy;  Laterality: N/A;  . BREAST BIOPSY Left   . BREAST LUMPECTOMY Right 08/18/2014   Procedure: RIGHT BREAST LUMPECTOMY;  Surgeon: Coralie Keens, MD;  Location: [facility];  Service: General;  Laterality: Right;  . CARDIAC ELECTROPHYSIOLOGY MAPPING AND ABLATION    . CATARACT EXTRACTION, BILATERAL    . ESOPHAGOGASTRODUODENOSCOPY N/A 09/03/2012   Procedure: ESOPHAGOGASTRODUODENOSCOPY (EGD);  Surgeon: Inda Castle, MD;  Location: [facility];  Service: Endoscopy;  Laterality: N/A;  . ESOPHAGOSCOPY W/ BOTOX INJECTION    . I&D EXTREMITY Right 11/06/2012   Procedure: IRRIGATION AND DEBRIDEMENT EXTREMITY Right Ring Finger;  Surgeon: Tennis Must, MD;  Location: [place];  Service: Orthopedics;  Laterality: Right;  . PARTIAL HYSTERECTOMY     ovaries left in place    Family History  Problem Relation Age of Onset  . Diabetes Mother   . CVA Mother   . Stroke Mother   . COPD Father   . Rheum arthritis Father   . Allergies Daughter   . COPD Daughter   . Stroke Daughter   . COPD Daughter        previous smoker  . Stroke Maternal Grandfather   . Heart disease Maternal Aunt   . Heart disease Maternal Uncle   . Colon cancer Neg Hx   . Colon polyps Neg Hx   . Esophageal cancer Neg Hx   . Gallbladder disease Neg Hx   . Kidney disease Neg Hx   . Heart attack Neg Hx   . Hypertension Neg Hx     Allergies  Allergen Reactions  . Darifenacin Hydrobromide     REACTION: causes her dizziness  . Sulfonamide Derivatives Other (See Comments)    Pt thinks it caused dizziness  . Tramadol Other (See Comments)    Insomnia, anorexia    Current Outpatient Medications on File Prior to Visit  Medication Sig Dispense Refill  . ALPRAZolam (XANAX) 0.25 MG tablet Take 0.5-1 tablets (0.125-0.25 mg total) by mouth 2 (two) times daily as needed for anxiety. 30 tablet 2  . azelastine (ASTELIN) 0.1 % nasal spray Place 2 sprays into both nostrils 2  (two) times daily. Use in each nostril as directed 30 mL 3  . Cholecalciferol (VITAMIN D) 2000 UNITS CAPS Take 2,000 Units by mouth every other day.    . Cyanocobalamin (VITAMIN B-12) 500 MCG SUBL Place 1 tablet (  500 mcg total) under the tongue daily at 2 PM. 150 tablet   . furosemide (LASIX) 20 MG tablet Take 1 tablet (20 mg total) by mouth daily as needed. 30 tablet 6  . metoprolol tartrate (LOPRESSOR) 25 MG tablet Take 1 tablet (25 mg total) by mouth 2 (two) times daily. 180 tablet 1  . ramipril (ALTACE) 10 MG capsule TAKE 1 CAPSULE (10 MG TOTAL) BY MOUTH DAILY. 90 capsule 0  . ramipril (ALTACE) 10 MG capsule Take 1 capsule (10 mg total) by mouth 2 (two) times daily. 60 capsule 3  . warfarin (COUMADIN) 2.5 MG tablet TAKE AS DIRECTED BY ANTICOAGULATION CLINIC. A 30 DAY SUPPLY 40 tablet 3  . pantoprazole (PROTONIX) 40 MG tablet TAKE 1 TABLET (40 MG TOTAL) BY MOUTH DAILY. (Patient not taking: Reported on 06/27/2017) 90 tablet 1   No current facility-administered medications on file prior to visit.     BP (!) 170/61 (BP Location: Right Arm, Patient Position: Sitting, Cuff Size: Small)   Pulse (!) 50   Resp 16   Ht 4\' 11"  (1.499 m)   Wt 93 lb 12.8 oz (42.5 kg)   SpO2 100%   BMI 18.95 kg/m    Objective:   Physical Exam  Constitutional: She is oriented to person, place, and time. She appears well-developed.  Frail appearing white elderly female  HENT:  Head: Normocephalic and atraumatic.  Cardiovascular: Normal rate, regular rhythm and normal heart sounds.  No murmur heard. Pulmonary/Chest: Effort normal and breath sounds normal. No respiratory distress. She has no wheezes.  Abdominal: Bowel sounds are normal. She exhibits no distension and no mass. There is no tenderness.  Musculoskeletal: She exhibits no edema.  Neurological: She is alert and oriented to person, place, and time.  Skin: Skin is warm and dry.  Psychiatric: She has a normal mood and affect. Her behavior is normal.  Judgment and thought content normal.          Assessment & Plan:  Epigastric pain- Improved today. CT abdomen looked OK during her ER visit. we will have her discontinue ranitidine and restart Protonix.  We will also check a H. pylori test.  I have asked for them to follow-up with her primary care in 2 weeks.  If symptoms are not improved at that time could consider referral to GI.  Anorexia- etiology is unclear, could be related to GERD and possible gastritis symptoms.Obtain follow up cmet.   Splenic lesion- discussed with patient and daughter that the etiology of this is unclear.  It is certainly possible that it could represent an underlying malignancy.  We discussed further work-up of this lesion and patient and daughter declined any further work-up as they do not feel that she would be a candidate for any cancer treatment given her frail state.  I agree that this is a reasonable approach.

## 2017-06-27 NOTE — Telephone Encounter (Signed)
Pt seen today

## 2017-06-28 LAB — COMPREHENSIVE METABOLIC PANEL
ALBUMIN: 3.8 g/dL (ref 3.5–5.2)
ALK PHOS: 15 U/L — AB (ref 39–117)
ALT: 6 U/L (ref 0–35)
AST: 14 U/L (ref 0–37)
BUN: 13 mg/dL (ref 6–23)
CO2: 28 meq/L (ref 19–32)
Calcium: 8.4 mg/dL (ref 8.4–10.5)
Chloride: 89 mEq/L — ABNORMAL LOW (ref 96–112)
Creatinine, Ser: 0.89 mg/dL (ref 0.40–1.20)
GFR: 63.12 mL/min (ref >60.00)
GLUCOSE: 101 mg/dL — AB (ref 70–99)
Potassium: 5.1 mEq/L (ref 3.5–5.1)
SODIUM: 124 meq/L — AB (ref 135–145)
Total Bilirubin: 0.4 mg/dL (ref 0.2–1.2)
Total Protein: 6.7 g/dL (ref 6.0–8.3)

## 2017-06-29 ENCOUNTER — Inpatient Hospital Stay (HOSPITAL_BASED_OUTPATIENT_CLINIC_OR_DEPARTMENT_OTHER)
Admission: EM | Admit: 2017-06-29 | Discharge: 2017-07-02 | DRG: 644 | Disposition: A | Discharge status: Home / Self Care | Payer: Medicare Other | Attending: Internal Medicine | Admitting: Internal Medicine

## 2017-06-29 ENCOUNTER — Other Ambulatory Visit: Payer: Self-pay

## 2017-06-29 ENCOUNTER — Telehealth: Payer: Self-pay | Admitting: Family

## 2017-06-29 ENCOUNTER — Encounter (HOSPITAL_BASED_OUTPATIENT_CLINIC_OR_DEPARTMENT_OTHER): Payer: Self-pay | Admitting: Emergency Medicine

## 2017-06-29 DIAGNOSIS — I4892 Unspecified atrial flutter: Secondary | ICD-10-CM | POA: Diagnosis not present

## 2017-06-29 DIAGNOSIS — M40209 Unspecified kyphosis, site unspecified: Secondary | ICD-10-CM | POA: Diagnosis present

## 2017-06-29 DIAGNOSIS — I10 Essential (primary) hypertension: Secondary | ICD-10-CM

## 2017-06-29 DIAGNOSIS — E440 Moderate protein-calorie malnutrition: Secondary | ICD-10-CM | POA: Diagnosis present

## 2017-06-29 DIAGNOSIS — R1010 Upper abdominal pain, unspecified: Secondary | ICD-10-CM

## 2017-06-29 DIAGNOSIS — K220 Achalasia of cardia: Secondary | ICD-10-CM | POA: Diagnosis not present

## 2017-06-29 DIAGNOSIS — K219 Gastro-esophageal reflux disease without esophagitis: Secondary | ICD-10-CM | POA: Diagnosis present

## 2017-06-29 DIAGNOSIS — D739 Disease of spleen, unspecified: Secondary | ICD-10-CM | POA: Diagnosis present

## 2017-06-29 DIAGNOSIS — D649 Anemia, unspecified: Secondary | ICD-10-CM | POA: Diagnosis present

## 2017-06-29 DIAGNOSIS — Z882 Allergy status to sulfonamides status: Secondary | ICD-10-CM | POA: Diagnosis not present

## 2017-06-29 DIAGNOSIS — R001 Bradycardia, unspecified: Secondary | ICD-10-CM | POA: Diagnosis not present

## 2017-06-29 DIAGNOSIS — R531 Weakness: Secondary | ICD-10-CM | POA: Diagnosis present

## 2017-06-29 DIAGNOSIS — Z7901 Long term (current) use of anticoagulants: Secondary | ICD-10-CM

## 2017-06-29 DIAGNOSIS — R1011 Right upper quadrant pain: Secondary | ICD-10-CM | POA: Diagnosis not present

## 2017-06-29 DIAGNOSIS — Z885 Allergy status to narcotic agent status: Secondary | ICD-10-CM | POA: Diagnosis not present

## 2017-06-29 DIAGNOSIS — K589 Irritable bowel syndrome without diarrhea: Secondary | ICD-10-CM | POA: Diagnosis present

## 2017-06-29 DIAGNOSIS — Z79899 Other long term (current) drug therapy: Secondary | ICD-10-CM

## 2017-06-29 DIAGNOSIS — I4891 Unspecified atrial fibrillation: Secondary | ICD-10-CM

## 2017-06-29 DIAGNOSIS — N200 Calculus of kidney: Secondary | ICD-10-CM | POA: Diagnosis not present

## 2017-06-29 DIAGNOSIS — R1310 Dysphagia, unspecified: Secondary | ICD-10-CM | POA: Diagnosis not present

## 2017-06-29 DIAGNOSIS — R0789 Other chest pain: Secondary | ICD-10-CM | POA: Diagnosis not present

## 2017-06-29 DIAGNOSIS — M8580 Other specified disorders of bone density and structure, unspecified site: Secondary | ICD-10-CM | POA: Diagnosis not present

## 2017-06-29 DIAGNOSIS — E222 Syndrome of inappropriate secretion of antidiuretic hormone: Secondary | ICD-10-CM | POA: Diagnosis not present

## 2017-06-29 DIAGNOSIS — I4819 Other persistent atrial fibrillation: Secondary | ICD-10-CM | POA: Diagnosis present

## 2017-06-29 DIAGNOSIS — E079 Disorder of thyroid, unspecified: Secondary | ICD-10-CM | POA: Diagnosis not present

## 2017-06-29 DIAGNOSIS — I080 Rheumatic disorders of both mitral and aortic valves: Secondary | ICD-10-CM | POA: Diagnosis not present

## 2017-06-29 DIAGNOSIS — E871 Hypo-osmolality and hyponatremia: Secondary | ICD-10-CM | POA: Diagnosis not present

## 2017-06-29 DIAGNOSIS — E785 Hyperlipidemia, unspecified: Secondary | ICD-10-CM | POA: Diagnosis not present

## 2017-06-29 DIAGNOSIS — R1031 Right lower quadrant pain: Secondary | ICD-10-CM | POA: Diagnosis not present

## 2017-06-29 DIAGNOSIS — F419 Anxiety disorder, unspecified: Secondary | ICD-10-CM | POA: Diagnosis present

## 2017-06-29 DIAGNOSIS — Z681 Body mass index (BMI) 19 or less, adult: Secondary | ICD-10-CM

## 2017-06-29 DIAGNOSIS — I480 Paroxysmal atrial fibrillation: Secondary | ICD-10-CM | POA: Diagnosis not present

## 2017-06-29 DIAGNOSIS — I481 Persistent atrial fibrillation: Secondary | ICD-10-CM | POA: Diagnosis not present

## 2017-06-29 DIAGNOSIS — Z888 Allergy status to other drugs, medicaments and biological substances status: Secondary | ICD-10-CM

## 2017-06-29 LAB — BASIC METABOLIC PANEL
Anion gap: 8 (ref 5–15)
BUN: 12 mg/dL (ref 8–23)
CO2: 24 mmol/L (ref 22–32)
CREATININE: 0.77 mg/dL (ref 0.44–1.00)
Calcium: 7.4 mg/dL — ABNORMAL LOW (ref 8.9–10.3)
Chloride: 91 mmol/L — ABNORMAL LOW (ref 98–111)
GFR calc Af Amer: >60 mL/min (ref >60)
GLUCOSE: 137 mg/dL — AB (ref 70–99)
POTASSIUM: 4.2 mmol/L (ref 3.5–5.1)
Sodium: 123 mmol/L — ABNORMAL LOW (ref 135–145)

## 2017-06-29 LAB — HEPATIC FUNCTION PANEL
ALK PHOS: 17 U/L — AB (ref 38–126)
ALT: 9 U/L (ref 0–44)
AST: 17 U/L (ref 15–41)
Albumin: 3.1 g/dL — ABNORMAL LOW (ref 3.5–5.0)
BILIRUBIN DIRECT: 0.1 mg/dL (ref 0.0–0.2)
BILIRUBIN INDIRECT: 0.5 mg/dL (ref 0.3–0.9)
Total Bilirubin: 0.6 mg/dL (ref 0.3–1.2)
Total Protein: 6.4 g/dL — ABNORMAL LOW (ref 6.5–8.1)

## 2017-06-29 LAB — URINALYSIS, ROUTINE W REFLEX MICROSCOPIC
Bilirubin Urine: NEGATIVE
GLUCOSE, UA: NEGATIVE mg/dL
HGB URINE DIPSTICK: NEGATIVE
Ketones, ur: NEGATIVE mg/dL
Leukocytes, UA: NEGATIVE
NITRITE: NEGATIVE
PH: 7.5 (ref 5.0–8.0)
PROTEIN: NEGATIVE mg/dL
SPECIFIC GRAVITY, URINE: 1.01 (ref 1.005–1.030)

## 2017-06-29 LAB — OSMOLALITY, URINE: Osmolality, Ur: 421 mOsm/kg (ref 300–900)

## 2017-06-29 LAB — CBC
HCT: 33.5 % — ABNORMAL LOW (ref 36.0–46.0)
HEMOGLOBIN: 11.9 g/dL — AB (ref 12.0–15.0)
MCH: 30.1 pg (ref 26.0–34.0)
MCHC: 35.5 g/dL (ref 30.0–36.0)
MCV: 84.8 fL (ref 78.0–100.0)
Platelets: 223 10*3/uL (ref 150–400)
RBC: 3.95 MIL/uL (ref 3.87–5.11)
RDW: 14.1 % (ref 11.5–15.5)
WBC: 7.4 10*3/uL (ref 4.0–10.5)

## 2017-06-29 LAB — PROTIME-INR
INR: 1.91
Prothrombin Time: 21.7 seconds — ABNORMAL HIGH (ref 11.4–15.2)

## 2017-06-29 LAB — TROPONIN I: Troponin I: <0.03 ng/mL (ref <0.03)

## 2017-06-29 LAB — SODIUM, URINE, RANDOM: Sodium, Ur: 70 mmol/L

## 2017-06-29 LAB — CK: CK TOTAL: 27 U/L — AB (ref 38–234)

## 2017-06-29 LAB — MAGNESIUM: Magnesium: 1.9 mg/dL (ref 1.7–2.4)

## 2017-06-29 LAB — CREATININE, URINE, RANDOM: Creatinine, Urine: 99.16 mg/dL

## 2017-06-29 MED ORDER — ACETAMINOPHEN 325 MG PO TABS: 650.0000 mg | ORAL_TABLET | Freq: Four times a day (QID) | ORAL | Status: DC | PRN

## 2017-06-29 MED ORDER — RAMIPRIL 10 MG PO CAPS
10.0000 mg | ORAL_CAPSULE | Freq: Every day | ORAL | Status: DC
  Administered 2017-06-30 – 2017-07-02 (×3): 10 mg via ORAL
  Filled 2017-06-29 (×3): qty 1

## 2017-06-29 MED ORDER — WARFARIN - PHARMACIST DOSING INPATIENT: Freq: Every day | Status: DC

## 2017-06-29 MED ORDER — SODIUM CHLORIDE 0.9 % IV BOLUS
500.0000 mL | Freq: Once | INTRAVENOUS | Status: AC
  Administered 2017-06-29: 500 mL via INTRAVENOUS

## 2017-06-29 MED ORDER — SODIUM CHLORIDE 1 G PO TABS
2.0000 g | ORAL_TABLET | Freq: Two times a day (BID) | ORAL | Status: DC
  Administered 2017-06-30 – 2017-07-02 (×5): 2 g via ORAL
  Filled 2017-06-29 (×5): qty 2

## 2017-06-29 MED ORDER — WARFARIN SODIUM 3 MG PO TABS
3.0000 mg | ORAL_TABLET | Freq: Once | ORAL | Status: AC
  Administered 2017-06-30: 3 mg via ORAL
  Filled 2017-06-29: qty 1

## 2017-06-29 MED ORDER — ONDANSETRON HCL 4 MG/2ML IJ SOLN: 4.0000 mg | Freq: Four times a day (QID) | INTRAMUSCULAR | Status: DC | PRN

## 2017-06-29 MED ORDER — ALPRAZOLAM 0.25 MG PO TABS: 0.1250 mg | ORAL_TABLET | Freq: Two times a day (BID) | ORAL | Status: DC | PRN

## 2017-06-29 MED ORDER — SODIUM CHLORIDE 0.9 % IV SOLN: INTRAVENOUS | Status: DC

## 2017-06-29 MED ORDER — ONDANSETRON HCL 4 MG PO TABS
4.0000 mg | ORAL_TABLET | Freq: Four times a day (QID) | ORAL | Status: DC | PRN
Start: 2017-06-29 — End: 2017-07-02

## 2017-06-29 MED ORDER — ACETAMINOPHEN 650 MG RE SUPP
650.0000 mg | Freq: Four times a day (QID) | RECTAL | Status: DC | PRN
Start: 2017-06-29 — End: 2017-07-02

## 2017-06-29 MED ORDER — HYDRALAZINE HCL 20 MG/ML IJ SOLN
5.0000 mg | INTRAMUSCULAR | Status: DC | PRN
  Administered 2017-06-29: 5 mg via INTRAVENOUS
  Filled 2017-06-29: qty 1

## 2017-06-29 NOTE — H&P (Addendum)
History and Physical    [name] [name] TDS:[id number] DOB: 20-Feb-1924 DOA: 06/29/2017  PCP: Mosie Lukes, MD  Patient coming from: Home  I have personally briefly reviewed patient's old medical records in [place]  Chief Complaint: Generalized weakness  HPI: [name] [name] is a 84 y.o. female with medical history significant of A.Fib on coumadin, HTN.  Patient presents to the ED at [facility] with generalized weakness, decreased appetite.  Seen in ER 1 week ago with CP, CTA chest / abd / pelvis done at that time was neg for dissection.  Only finding was a 59mm possible mass in spleen that they recommended outpt MRI for.  Sodium at that time was 134.  She followed up with PCP 2 days ago and Sodium was down to 124.   ED Course: Today sodium is 123.  She is also noted to have A.Fib bradycardia with episodes of bradycardia into the 30s.   Review of Systems: As per HPI otherwise 10 point review of systems negative.   Past Medical History:  Diagnosis Date  . Anemia   . Anxiety   . Arthritis    hips, knees  . Atrial fibrillation ([place])   . Current use of long term anticoagulation   . Diverticulitis of colon 02/06/2015  . Diverticulosis   . Gait difficulty   . GERD (gastroesophageal reflux disease)   . Headache(784.0) 06/22/2012  . Hyperlipidemia   . Hypertension   . IBS (irritable bowel syndrome)   . Incontinence   . Loss of weight 06/05/2015  . Medicare annual wellness visit, subsequent 02/05/2014  . Melena   . Mitral and aortic regurgitation   . Mitral valve prolapse    hx of  . Osteopenia   . Pedal edema 10/01/2016  . Personal history of colonic polyps   . Renal lithiasis 06/26/2015  . Thyroid disease    hypo  . Unspecified adverse effect of other drug, medicinal and biological substance(995.29)   . Vitamin B12 deficiency 03/16/2016  . Vitamin D deficiency     Past Surgical History:  Procedure Laterality Date  . BOTOX INJECTION N/A 09/03/2012   Procedure: BOTOX INJECTION;   Surgeon: Inda Castle, MD;  Location: [facility];  Service: Endoscopy;  Laterality: N/A;  . BREAST BIOPSY Left   . BREAST LUMPECTOMY Right 08/18/2014   Procedure: RIGHT BREAST LUMPECTOMY;  Surgeon: Coralie Keens, MD;  Location: [place];  Service: General;  Laterality: Right;  . CARDIAC ELECTROPHYSIOLOGY MAPPING AND ABLATION    . CATARACT EXTRACTION, BILATERAL    . ESOPHAGOGASTRODUODENOSCOPY N/A 09/03/2012   Procedure: ESOPHAGOGASTRODUODENOSCOPY (EGD);  Surgeon: Inda Castle, MD;  Location: [facility];  Service: Endoscopy;  Laterality: N/A;  . ESOPHAGOSCOPY W/ BOTOX INJECTION    . I&D EXTREMITY Right 11/06/2012   Procedure: IRRIGATION AND DEBRIDEMENT EXTREMITY Right Ring Finger;  Surgeon: Tennis Must, MD;  Location: [facility];  Service: Orthopedics;  Laterality: Right;  . PARTIAL HYSTERECTOMY     ovaries left in place     reports that she has never smoked. She has never used smokeless tobacco. She reports that she does not drink alcohol or use drugs.  Allergies  Allergen Reactions  . Darifenacin Hydrobromide     REACTION: causes her dizziness  . Sulfonamide Derivatives Other (See Comments)    Pt thinks it caused dizziness  . Tramadol Other (See Comments)    Insomnia, anorexia    Family History  Problem Relation Age of Onset  .  Diabetes Mother   . CVA Mother   . Stroke Mother   . COPD Father   . Rheum arthritis Father   . Allergies Daughter   . COPD Daughter   . Stroke Daughter   . COPD Daughter        previous smoker  . Stroke Maternal Grandfather   . Heart disease Maternal Aunt   . Heart disease Maternal Uncle   . Colon cancer Neg Hx   . Colon polyps Neg Hx   . Esophageal cancer Neg Hx   . Gallbladder disease Neg Hx   . Kidney disease Neg Hx   . Heart attack Neg Hx   . Hypertension Neg Hx      Prior to Admission medications   Medication Sig Start Date End Date Taking? Authorizing Provider  ALPRAZolam (XANAX) 0.25 MG tablet Take 0.5-1  tablets (0.125-0.25 mg total) by mouth 2 (two) times daily as needed for anxiety. 05/09/17   Mosie Lukes, MD  azelastine (ASTELIN) 0.1 % nasal spray Place 2 sprays into both nostrils 2 (two) times daily. Use in each nostril as directed 05/09/17   Mosie Lukes, MD  Cholecalciferol (VITAMIN D) 2000 UNITS CAPS Take 2,000 Units by mouth every other day. 02/20/12   Mosie Lukes, MD  Cyanocobalamin (VITAMIN B-12) 500 MCG SUBL Place 1 tablet (500 mcg total) under the tongue daily at 2 PM. 06/22/16   Mosie Lukes, MD  furosemide (LASIX) 20 MG tablet Take 1 tablet (20 mg total) by mouth daily as needed. 06/12/17   Dorothy Spark, MD  metoprolol tartrate (LOPRESSOR) 25 MG tablet Take 1 tablet (25 mg total) by mouth 2 (two) times daily. 06/12/17   Dorothy Spark, MD  pantoprazole (PROTONIX) 40 MG tablet TAKE 1 TABLET (40 MG TOTAL) BY MOUTH DAILY. Patient not taking: Reported on 06/27/2017 03/25/17   Mosie Lukes, MD  ramipril (ALTACE) 10 MG capsule TAKE 1 CAPSULE (10 MG TOTAL) BY MOUTH DAILY. 06/11/17   Mosie Lukes, MD  warfarin (COUMADIN) 2.5 MG tablet TAKE AS DIRECTED BY ANTICOAGULATION CLINIC. A 30 DAY SUPPLY 11/07/16   Dorothy Spark, MD    Physical Exam: Vitals:   06/29/17 2115 06/29/17 2130 06/29/17 2219 06/29/17 2223  BP:    (!) 188/60  Pulse: 79 66  (!) 58  Resp: 19 19  18   Temp:    98.3 F (36.8 C)  TempSrc:    Oral  SpO2: 97% 98%  100%  Weight:   42.4 kg (93 lb 7.6 oz)   Height:   4\' 11"  (1.499 m)     Constitutional: NAD, calm, comfortable Eyes: PERRL, lids and conjunctivae normal ENMT: Mucous membranes are moist. Posterior pharynx clear of any exudate or lesions.Normal dentition.  Neck: normal, supple, no masses, no thyromegaly Respiratory: clear to auscultation bilaterally, no wheezing, no crackles. Normal respiratory effort. No accessory muscle use.  Cardiovascular: Regular rate and rhythm, no murmurs / rubs / gallops. No extremity edema. 2+ pedal pulses. No  carotid bruits.  Abdomen: no tenderness, no masses palpated. No hepatosplenomegaly. Bowel sounds positive.  Musculoskeletal: no clubbing / cyanosis. No joint deformity upper and lower extremities. Good ROM, no contractures. Normal muscle tone.  Skin: no rashes, lesions, ulcers. No induration Neurologic: CN 2-12 grossly intact. Sensation intact, DTR normal. Strength 5/5 in all 4.  Psychiatric: Normal judgment and insight. Alert and oriented x 3. Normal mood.    Labs on Admission: I have personally reviewed following labs  and imaging studies  CBC: Recent Labs  Lab 06/29/17 1500  WBC 7.4  HGB 11.9*  HCT 33.5*  MCV 84.8  PLT 017   Basic Metabolic Panel: Recent Labs  Lab 06/27/17 1543 06/29/17 1542  NA 124* 123*  K 5.1 4.2  CL 89* 91*  CO2 28 24  GLUCOSE 101* 137*  BUN 13 12  CREATININE 0.89 0.77  CALCIUM 8.4 7.4*  MG  --  1.9   GFR: Estimated Creatinine Clearance: 30.7 mL/min (by C-G formula based on SCr of 0.77 mg/dL). Liver Function Tests: Recent Labs  Lab 06/27/17 1543 06/29/17 1500  AST 14 17  ALT 6 9  ALKPHOS 15* 17*  BILITOT 0.4 0.6  PROT 6.7 6.4*  ALBUMIN 3.8 3.1*   No results for input(s): LIPASE, AMYLASE in the last 168 hours. No results for input(s): AMMONIA in the last 168 hours. Coagulation Profile: Recent Labs  Lab 06/29/17 1500  INR 1.91   Cardiac Enzymes: Recent Labs  Lab 06/29/17 1500 06/29/17 2057  CKTOTAL 27*  --   TROPONINI  --  <0.03   BNP (last 3 results) No results for input(s): PROBNP in the last 8760 hours. HbA1C: No results for input(s): HGBA1C in the last 72 hours. CBG: No results for input(s): GLUCAP in the last 168 hours. Lipid Profile: No results for input(s): CHOL, HDL, LDLCALC, TRIG, CHOLHDL, LDLDIRECT in the last 72 hours. Thyroid Function Tests: No results for input(s): TSH, T4TOTAL, FREET4, T3FREE, THYROIDAB in the last 72 hours. Anemia Panel: No results for input(s): VITAMINB12, FOLATE, FERRITIN, TIBC, IRON,  RETICCTPCT in the last 72 hours. Urine analysis:    Component Value Date/Time   COLORURINE YELLOW 06/29/2017 1621   APPEARANCEUR CLOUDY (A) 06/29/2017 1621   LABSPEC 1.010 06/29/2017 1621   PHURINE 7.5 06/29/2017 1621   GLUCOSEU NEGATIVE 06/29/2017 1621   GLUCOSEU NEGATIVE 06/20/2015 1617   HGBUR NEGATIVE 06/29/2017 1621   HGBUR trace-intact 01/19/2009 0929   BILIRUBINUR NEGATIVE 06/29/2017 1621   BILIRUBINUR negative 02/01/2017 1039   KETONESUR NEGATIVE 06/29/2017 1621   PROTEINUR NEGATIVE 06/29/2017 1621   UROBILINOGEN 0.2 02/01/2017 1039   UROBILINOGEN 0.2 06/20/2015 1617   NITRITE NEGATIVE 06/29/2017 1621   LEUKOCYTESUR NEGATIVE 06/29/2017 1621    Radiological Exams on Admission: No results found.  EKG: Independently reviewed.  Assessment/Plan Principal Problem:   Hyponatremia Active Problems:   Essential hypertension   PAROXYSMAL ATRIAL FIBRILLATION   Chronic anticoagulation   Bradycardia    1. Hyponatremia - Looks like SIADH from urine lytes, urine sodium higher than I would expect 1. Fluid restrict to 123ml / day 2. Cont to hold lasix 3. Got 500cc of NS in ED, will hold off on further IVF for the moment. 4. Repeat BMP in AM 5. CT on the 20th reviewed, 55mm ? Mass on spleen but otherwise negative (no widespread metastatic SCLC to be causing SIADH) 6. Regular diet 7. Curbside nephrology in AM 2. Bradycardia - 1. Hold metoprolol 2. Tele monitor 3. PAF - 1. Hold metoprolol 2. Continue coumadin 4. HTN - 1. PRN hydralazine 2. Will put her on ramipril once daily for now (was increased to BID x2 months back).  DVT prophylaxis: coumadin Code Status: Full Family Communication: No family in room Disposition Plan: Home after admit Consults called: None Admission status: Admit to inpatient   Etta Quill DO [facility] Pager [phone] Only works nights!  If 7AM-7PM, please contact the primary day team physician taking care of  patient  [email or web address]  Password [email or web address]  06/29/2017, 10:48 PM

## 2017-06-29 NOTE — Progress Notes (Signed)
Spoke with Dr. Jonnie Finner:  Probably is either SIADH or reset osmostat.  1) Salt tablets 2g BID 2) he will see in AM 3) next steps will be resume Lasix and then demeclocycline if salt tabs dont work.

## 2017-06-29 NOTE — Progress Notes (Addendum)
ANTICOAGULATION CONSULT NOTE - Initial Consult  Pharmacy Consult for coumadin Indication: atrial fibrillation  Allergies  Allergen Reactions  . Darifenacin Hydrobromide     REACTION: causes her dizziness  . Sulfonamide Derivatives Other (See Comments)    Pt thinks it caused dizziness  . Tramadol Other (See Comments)    Insomnia, anorexia    Patient Measurements: Height: 4\' 11"  (149.9 cm) Weight: 93 lb 7.6 oz (42.4 kg) IBW/kg (Calculated) : 43.2   Vital Signs: Temp: 98.3 F (36.8 C) (06/29 2223) Temp Source: Oral (06/29 2223) BP: 188/60 (06/29 2223) Pulse Rate: 58 (06/29 2223)  Labs: Recent Labs    06/27/17 1543 06/29/17 1500 06/29/17 1542 06/29/17 2057  HGB  --  11.9*  --   --   HCT  --  33.5*  --   --   PLT  --  223  --   --   LABPROT  --  21.7*  --   --   INR  --  1.91  --   --   CREATININE 0.89  --  0.77  --   CKTOTAL  --  27*  --   --   TROPONINI  --   --   --  <0.03    Estimated Creatinine Clearance: 30.7 mL/min (by C-G formula based on SCr of 0.77 mg/dL).   Medical History: Past Medical History:  Diagnosis Date  . Anemia   . Anxiety   . Arthritis    hips, knees  . Atrial fibrillation ([place])   . Current use of long term anticoagulation   . Diverticulitis of colon 02/06/2015  . Diverticulosis   . Gait difficulty   . GERD (gastroesophageal reflux disease)   . Headache(784.0) 06/22/2012  . Hyperlipidemia   . Hypertension   . IBS (irritable bowel syndrome)   . Incontinence   . Loss of weight 06/05/2015  . Medicare annual wellness visit, subsequent 02/05/2014  . Melena   . Mitral and aortic regurgitation   . Mitral valve prolapse    hx of  . Osteopenia   . Pedal edema 10/01/2016  . Personal history of colonic polyps   . Renal lithiasis 06/26/2015  . Thyroid disease    hypo  . Unspecified adverse effect of other drug, medicinal and biological substance(995.29)   . Vitamin B12 deficiency 03/16/2016  . Vitamin D deficiency      Assessment: 84 yo  F on coumadin PTA for Afib.  Coumadin Clinic dose on 06/05/17: INR 3.2, take 1.25 6/5 them take 2.5 mg daily except 3.75 mg on Mondays. Weekly dose 18.75 mg.  Last dose 6/29 0830 am.  INR on admission 6/29 PM is slightly low at 1.91.  Hg 11.9, ptlc 223.   Goal of Therapy:  INR 2-3   Plan:  Coumadin 3 mg po 6/30 at 1800 Daily INR  Eudelia Bunch, Pharm.D. [phone] 06/29/2017 11:29 PM

## 2017-06-29 NOTE — ED Notes (Signed)
ED Provider at bedside. 

## 2017-06-29 NOTE — ED Provider Notes (Signed)
[name] EMERGENCY DEPARTMENT Provider Note   CSN: [id number] Arrival date & time: 06/29/17  1453     History   Chief Complaint Chief Complaint  Patient presents with  . Back Pain  . Weakness    HPI [name] [name] is a 84 y.o. female.  84 year old female with past medical history including atrial fibrillation on Coumadin, hypertension who presents with weakness.  The patient reports around 1 week of generalized weakness, decreased appetite," just not feeling well."  She presented to the ER approximately 1 week ago with chest pain and some radiation of the pain to her back.  She had work-up including imaging that was negative for dissection.  She was instructed to follow-up with PCP and went to the clinic a few days ago.  There she had lab work drawn that was notable for sodium of 124.  She reports that she does not take Lasix and is currently off of Xanax.  She denies any recent changes to her other medications.  Her chest and back pain symptoms have not worsened and she denies any cough/cold symptoms, fevers, nausea, vomiting, abdominal pain, bloody stools, or urinary symptoms.  No lightheadedness or dyspnea on exertion.  No orthopnea or paroxysmal nocturnal dyspnea.  No lower extremity edema.  The history is provided by the patient.  Back Pain   Associated symptoms include weakness.  Weakness     Past Medical History:  Diagnosis Date  . Anemia   . Anxiety   . Arthritis    hips, knees  . Atrial fibrillation ([place])   . Current use of long term anticoagulation   . Diverticulitis of colon 02/06/2015  . Diverticulosis   . Gait difficulty   . GERD (gastroesophageal reflux disease)   . Headache(784.0) 06/22/2012  . Hyperlipidemia   . Hypertension   . IBS (irritable bowel syndrome)   . Incontinence   . Loss of weight 06/05/2015  . Medicare annual wellness visit, subsequent 02/05/2014  . Melena   . Mitral and aortic regurgitation   . Mitral valve prolapse    hx of  .  Osteopenia   . Pedal edema 10/01/2016  . Personal history of colonic polyps   . Renal lithiasis 06/26/2015  . Thyroid disease    hypo  . Unspecified adverse effect of other drug, medicinal and biological substance(995.29)   . Vitamin B12 deficiency 03/16/2016  . Vitamin D deficiency     Patient Active Problem List   Diagnosis Date Noted  . Hyponatremia 06/29/2017  . Chest pain 12/10/2016  . Pedal edema 10/01/2016  . Preventative health care 03/18/2016  . Vitamin B12 deficiency 03/16/2016  . Renal lithiasis 06/26/2015  . Loss of weight 06/05/2015  . Diverticulitis of colon 02/06/2015  . Right-sided thoracic back pain 11/15/2014  . Nipple discharge 06/21/2014  . Rib pain on right side 04/29/2014  . Medicare annual wellness visit, subsequent 02/05/2014  . Abdominal pain, chronic, right lower quadrant 02/03/2014  . Rectal bleeding 12/03/2013  . Prolapse of female pelvic organs 12/03/2013  . Leukocytes in urine 11/25/2013  . Abnormal chest x-ray 10/23/2013  . Pulmonary hypertension ([place]) 10/09/2013  . Tricuspid regurgitation 10/09/2013  . Chronic anticoagulation 10/09/2013  . Hyperglycemia 08/24/2013  . Encounter for therapeutic drug monitoring 03/13/2013  . Renal insufficiency 06/28/2012  . Hyperkalemia 06/28/2012  . Headache 06/22/2012  . Sinus bradycardia 09/20/2011  . Increased urinary frequency 02/13/2011  . Arthritis 11/13/2010  . Long term (current) use of anticoagulants 03/31/2010  . DIZZINESS  08/03/2009  . ACHALASIA 05/18/2009  . DYSPHAGIA UNSPECIFIED 04/11/2009  . Anemia 01/19/2009  . ESOPHAGEAL STRICTURE 01/19/2009  . Anxiety state 09/15/2008  . MITRAL REGURGITATION, MILD 08/20/2008  . PAROXYSMAL ATRIAL FIBRILLATION 08/20/2008  . MITRAL VALVE PROLAPSE, HX OF 08/20/2008  . Vitamin D deficiency 01/15/2008  . IBS 02/03/2007  . Hypothyroidism 01/29/2007  . Hyperlipidemia, mixed 01/29/2007  . Essential hypertension 01/29/2007  . GERD 01/29/2007  . Disorder of  bone and cartilage 01/29/2007  . COLONIC POLYPS, HX OF 01/29/2007    Past Surgical History:  Procedure Laterality Date  . BOTOX INJECTION N/A 09/03/2012   Procedure: BOTOX INJECTION;  Surgeon: Inda Castle, MD;  Location: [facility];  Service: Endoscopy;  Laterality: N/A;  . BREAST BIOPSY Left   . BREAST LUMPECTOMY Right 08/18/2014   Procedure: RIGHT BREAST LUMPECTOMY;  Surgeon: Coralie Keens, MD;  Location: [facility];  Service: General;  Laterality: Right;  . CARDIAC ELECTROPHYSIOLOGY MAPPING AND ABLATION    . CATARACT EXTRACTION, BILATERAL    . ESOPHAGOGASTRODUODENOSCOPY N/A 09/03/2012   Procedure: ESOPHAGOGASTRODUODENOSCOPY (EGD);  Surgeon: Inda Castle, MD;  Location: [facility];  Service: Endoscopy;  Laterality: N/A;  . ESOPHAGOSCOPY W/ BOTOX INJECTION    . I&D EXTREMITY Right 11/06/2012   Procedure: IRRIGATION AND DEBRIDEMENT EXTREMITY Right Ring Finger;  Surgeon: Tennis Must, MD;  Location: [facility];  Service: Orthopedics;  Laterality: Right;  . PARTIAL HYSTERECTOMY     ovaries left in place     OB History   None      Home Medications    Prior to Admission medications   Medication Sig Start Date End Date Taking? Authorizing Provider  ALPRAZolam (XANAX) 0.25 MG tablet Take 0.5-1 tablets (0.125-0.25 mg total) by mouth 2 (two) times daily as needed for anxiety. 05/09/17   Mosie Lukes, MD  azelastine (ASTELIN) 0.1 % nasal spray Place 2 sprays into both nostrils 2 (two) times daily. Use in each nostril as directed 05/09/17   Mosie Lukes, MD  Cholecalciferol (VITAMIN D) 2000 UNITS CAPS Take 2,000 Units by mouth every other day. 02/20/12   Mosie Lukes, MD  Cyanocobalamin (VITAMIN B-12) 500 MCG SUBL Place 1 tablet (500 mcg total) under the tongue daily at 2 PM. 06/22/16   Mosie Lukes, MD  furosemide (LASIX) 20 MG tablet Take 1 tablet (20 mg total) by mouth daily as needed. 06/12/17   Dorothy Spark, MD  metoprolol tartrate (LOPRESSOR) 25 MG  tablet Take 1 tablet (25 mg total) by mouth 2 (two) times daily. 06/12/17   Dorothy Spark, MD  pantoprazole (PROTONIX) 40 MG tablet TAKE 1 TABLET (40 MG TOTAL) BY MOUTH DAILY. Patient not taking: Reported on 06/27/2017 03/25/17   Mosie Lukes, MD  ramipril (ALTACE) 10 MG capsule TAKE 1 CAPSULE (10 MG TOTAL) BY MOUTH DAILY. 06/11/17   Mosie Lukes, MD  ramipril (ALTACE) 10 MG capsule Take 1 capsule (10 mg total) by mouth 2 (two) times daily. 06/14/17   Mosie Lukes, MD  warfarin (COUMADIN) 2.5 MG tablet TAKE AS DIRECTED BY ANTICOAGULATION CLINIC. A 30 DAY SUPPLY 11/07/16   Dorothy Spark, MD    Family History Family History  Problem Relation Age of Onset  . Diabetes Mother   . CVA Mother   . Stroke Mother   . COPD Father   . Rheum arthritis Father   . Allergies Daughter   . COPD Daughter   . Stroke Daughter   .  COPD Daughter        previous smoker  . Stroke Maternal Grandfather   . Heart disease Maternal Aunt   . Heart disease Maternal Uncle   . Colon cancer Neg Hx   . Colon polyps Neg Hx   . Esophageal cancer Neg Hx   . Gallbladder disease Neg Hx   . Kidney disease Neg Hx   . Heart attack Neg Hx   . Hypertension Neg Hx     Social History Social History   Tobacco Use  . Smoking status: Never Smoker  . Smokeless tobacco: Never Used  Substance Use Topics  . Alcohol use: No  . Drug use: No     Allergies   Darifenacin hydrobromide; Sulfonamide derivatives; and Tramadol   Review of Systems Review of Systems  Musculoskeletal: Positive for back pain.  Neurological: Positive for weakness.   All other systems reviewed and are negative except that which was mentioned in HPI   Physical Exam Updated Vital Signs BP (!) 153/67   Pulse (!) 39   Temp 97.7 F (36.5 C) (Oral)   Resp 15   Ht 4\' 11"  (1.499 m)   Wt 42.2 kg (93 lb)   SpO2 99%   BMI 18.78 kg/m   Physical Exam  Constitutional: She is oriented to person, place, and time. She appears  well-developed and well-nourished. No distress.  Frail, elderly woman awake and comfortable  HENT:  Head: Normocephalic and atraumatic.  Moist mucous membranes  Eyes: Pupils are equal, round, and reactive to light. Conjunctivae are normal.  Neck: Neck supple.  Cardiovascular: Regular rhythm. Bradycardia present.  Murmur heard. Pulmonary/Chest: Effort normal and breath sounds normal.  Abdominal: Soft. Bowel sounds are normal. She exhibits no distension. There is no tenderness.  Musculoskeletal: She exhibits no edema.  Neurological: She is alert and oriented to person, place, and time.  Fluent speech  Skin: Skin is warm and dry.  Psychiatric: She has a normal mood and affect. Judgment normal.  Nursing note and vitals reviewed.    ED Treatments / Results  Labs (all labs ordered are listed, but only abnormal results are displayed) Labs Reviewed  CBC - Abnormal; Notable for the following components:      Result Value   Hemoglobin 11.9 (*)    HCT 33.5 (*)    All other components within normal limits  URINALYSIS, ROUTINE W REFLEX MICROSCOPIC - Abnormal; Notable for the following components:   APPearance CLOUDY (*)    All other components within normal limits  BASIC METABOLIC PANEL - Abnormal; Notable for the following components:   Sodium 123 (*)    Chloride 91 (*)    Glucose, Bld 137 (*)    Calcium 7.4 (*)    All other components within normal limits  HEPATIC FUNCTION PANEL - Abnormal; Notable for the following components:   Total Protein 6.4 (*)    Albumin 3.1 (*)    Alkaline Phosphatase 17 (*)    All other components within normal limits  PROTIME-INR - Abnormal; Notable for the following components:   Prothrombin Time 21.7 (*)    All other components within normal limits  CK - Abnormal; Notable for the following components:   Total CK 27 (*)    All other components within normal limits  SODIUM, URINE, RANDOM  CREATININE, URINE, RANDOM  OSMOLALITY, URINE  MAGNESIUM    TROPONIN I  TROPONIN I  TROPONIN I    EKG EKG Interpretation  Date/Time:  Saturday June 29 2017  15:05:37 EDT Ventricular Rate:  53 PR Interval:    QRS Duration: 94 QT Interval:  462 QTC Calculation: 433 R Axis:   50 Text Interpretation:  Atrial fibrillation with slow ventricular response Septal infarct , age undetermined ST & T wave abnormality, consider lateral ischemia Abnormal ECG similar to previous Confirmed by Theotis Burrow [phone]) on 06/29/2017 4:28:42 PM   Radiology No results found.  Procedures Procedures (including critical care time)  Medications Ordered in ED Medications - No data to display   Initial Impression / Assessment and Plan / ED Course  I have reviewed the triage vital signs and the nursing notes.  Pertinent labs that were available during my care of the patient were reviewed by me and considered in my medical decision making (see chart for details).    Patient was comfortable on exam.  Her heart rate was in the 40s but I did note that it dropped into the 30s occasionally during conversation, no sustained rate in the 30s.  She appeared to be asymptomatic from it.  EKG shows slow atrial fibrillation.  Her labs are notable for sodium of 123 which is worsening from a few days ago at 124, chloride 91, normal creatinine, UA without evidence of infection, INR 1.9.  Because of her worsening hyponatremia and severe bradycardia, recommended admission.  Discussed admission to [facility] with [facility] hospitalist, Dr. Roel Cluck. Pt will be transferred for further care. Patient is FULL CODE. Final Clinical Impressions(s) / ED Diagnoses   Final diagnoses:  Hyponatremia  Bradycardia    ED Discharge Orders    None       Jesika Men, Wenda Overland, MD 06/29/17 2012

## 2017-06-29 NOTE — Plan of Care (Signed)
Called from [facility] by Dr.Little  Debbrah Alar, NP    [name] [name] 84 y.o. With  Past hx of hypertension, atrial fibrillation Last week was seen for CP negative work up Na have been trending down was told to stop lasix  In ER bradycardic to 30's. Appears to be slow a.fib On metoprolol for A.fib No longer on xanax  On coumadin for A.fib  Presents today with  Chief Complaint  Patient presents with  . Back Pain  . Weakness   Recent appetite and generalized weakness  Blood pressure (!) 153/67, pulse (!) 39, temperature 97.7 F (36.5 C), temperature source Oral, resp. rate 15, height 4' 11"  (1.499 m), weight 42.2 kg (93 lb), SpO2 99 %.    Significant findings: ER obtained following labs  Lab Orders     CBC     Urinalysis, Routine w reflex microscopic     Basic metabolic panel     Hepatic function panel     Protime-INR     CK  Results for orders placed or performed during the hospital encounter of 06/29/17 (from the past 48 hour(s))  CBC     Status: Abnormal   Collection Time: 06/29/17  3:00 PM  Result Value Ref Range   WBC 7.4 4.0 - 10.5 K/uL   RBC 3.95 3.87 - 5.11 MIL/uL   Hemoglobin 11.9 (L) 12.0 - 15.0 g/dL   HCT 33.5 (L) 36.0 - 46.0 %   MCV 84.8 78.0 - 100.0 fL   MCH 30.1 26.0 - 34.0 pg   MCHC 35.5 30.0 - 36.0 g/dL   RDW 14.1 11.5 - 15.5 %   Platelets 223 150 - 400 K/uL    Comment: Performed at [facility], [address] [place], [place] [address]  Hepatic function panel     Status: Abnormal   Collection Time: 06/29/17  3:00 PM  Result Value Ref Range   Total Protein 6.4 (L) 6.5 - 8.1 g/dL   Albumin 3.1 (L) 3.5 - 5.0 g/dL   AST 17 15 - 41 U/L   ALT 9 0 - 44 U/L    Comment: Please note change in reference range.   Alkaline Phosphatase 17 (L) 38 - 126 U/L   Total Bilirubin 0.6 0.3 - 1.2 mg/dL   Bilirubin, Direct 0.1 0.0 - 0.2 mg/dL    Comment: Please note change in reference range.   Indirect Bilirubin 0.5 0.3 - 0.9 mg/dL    Comment:  Performed at [facility], [address] [place], [place] [address]  Protime-INR     Status: Abnormal   Collection Time: 06/29/17  3:00 PM  Result Value Ref Range   Prothrombin Time 21.7 (H) 11.4 - 15.2 seconds   INR 1.91     Comment: Performed at [facility], [address] [place], [place] [address]  CK     Status: Abnormal   Collection Time: 06/29/17  3:00 PM  Result Value Ref Range   Total CK 27 (L) 38 - 234 U/L    Comment: Performed at [facility], [address] [place], [place] [address]  Basic metabolic panel     Status: Abnormal   Collection Time: 06/29/17  3:42 PM  Result Value Ref Range   Sodium 123 (L) 135 - 145 mmol/L   Potassium 4.2 3.5 - 5.1 mmol/L   Chloride 91 (L) 98 - 111 mmol/L    Comment: Please note change in  reference range.   CO2 24 22 - 32 mmol/L   Glucose, Bld 137 (H) 70 - 99 mg/dL    Comment: Please note change in reference range.   BUN 12 8 - 23 mg/dL    Comment: Please note change in reference range.   Creatinine, Ser 0.77 0.44 - 1.00 mg/dL   Calcium 7.4 (L) 8.9 - 10.3 mg/dL   GFR calc non Af Amer >60 >60 mL/min   GFR calc Af Amer >60 >60 mL/min    Comment: (NOTE) The eGFR has been calculated using the CKD EPI equation. This calculation has not been validated in all clinical situations. eGFR's persistently <60 mL/min signify possible Chronic Kidney Disease.    Anion gap 8 5 - 15    Comment: Performed at [facility], [address] [place], [place] [address]  Urinalysis, Routine w reflex microscopic     Status: Abnormal   Collection Time: 06/29/17  4:21 PM  Result Value Ref Range   Color, Urine YELLOW YELLOW   APPearance CLOUDY (A) CLEAR   Specific Gravity, Urine 1.010 1.005 - 1.030   pH 7.5 5.0 - 8.0   Glucose, UA NEGATIVE NEGATIVE mg/dL   Hgb urine dipstick NEGATIVE NEGATIVE   Bilirubin Urine NEGATIVE NEGATIVE   Ketones, ur NEGATIVE NEGATIVE mg/dL   Protein, ur NEGATIVE NEGATIVE mg/dL    Nitrite NEGATIVE NEGATIVE   Leukocytes, UA NEGATIVE NEGATIVE    Comment: Microscopic not done on urines with negative protein, blood, leukocytes, nitrite, or glucose < 500 mg/dL. Performed at [facility], [address] [place], [place] [address]       Radiology   none       Overall worrisome for dehydration with evidence of hyponatremia and bradycardia would hold metoprolol     Plan to Admit    [place] 7:29 PM

## 2017-06-29 NOTE — Telephone Encounter (Signed)
Attempted to reach patient and daughter at numbers listed.  No answer. Left detailed message on pt's voicemail:  Hold lasix, limit fluid intake to no more than 1 liter/day. Go to ER if worsening symptoms. We will arrange follow up early next week.   Please contact pt on Monday Am to ensure that she received the above message and to arrange follow up in our office on Tuesday.

## 2017-06-29 NOTE — ED Notes (Signed)
Alert, NAD, calm, interactive, resps e/u, no dyspnea noted, VSS, HR brady. Family at [facility]. Ambulatory with steady gait with assistance to b/r.

## 2017-06-29 NOTE — ED Triage Notes (Signed)
Pt states " I just don't feel good". Reports ongoing back pain for several weeks, decrease in appetite, generalized weakness. Denies N/V, chest pain, SOB

## 2017-06-30 LAB — BASIC METABOLIC PANEL
Anion gap: 10 (ref 5–15)
BUN: 11 mg/dL (ref 8–23)
CALCIUM: 8.5 mg/dL — AB (ref 8.9–10.3)
CO2: 24 mmol/L (ref 22–32)
Chloride: 94 mmol/L — ABNORMAL LOW (ref 98–111)
Creatinine, Ser: 0.89 mg/dL (ref 0.44–1.00)
GFR calc Af Amer: >60 mL/min (ref >60)
GFR, EST NON AFRICAN AMERICAN: 55 mL/min — AB (ref >60)
GLUCOSE: 100 mg/dL — AB (ref 70–99)
Potassium: 4.6 mmol/L (ref 3.5–5.1)
Sodium: 128 mmol/L — ABNORMAL LOW (ref 135–145)

## 2017-06-30 MED ORDER — VITAMIN D 1000 UNITS PO TABS
2000.0000 [IU] | ORAL_TABLET | ORAL | Status: DC
  Administered 2017-06-30 – 2017-07-02 (×2): 2000 [IU] via ORAL
  Filled 2017-06-30: qty 2

## 2017-06-30 MED ORDER — ENSURE ENLIVE PO LIQD
237.0000 mL | Freq: Two times a day (BID) | ORAL | Status: DC
  Administered 2017-06-30 – 2017-07-02 (×5): 237 mL via ORAL

## 2017-06-30 MED ORDER — PANTOPRAZOLE SODIUM 40 MG PO TBEC
40.0000 mg | DELAYED_RELEASE_TABLET | Freq: Every day | ORAL | Status: DC
  Administered 2017-06-30: 40 mg via ORAL

## 2017-06-30 NOTE — Plan of Care (Signed)
  Problem: Safety: Goal: Ability to remain free from injury will improve Outcome: Progressing   

## 2017-06-30 NOTE — Consult Note (Signed)
Renal Service Consult Note [facility]  [name] [name] 06/30/2017 [name] Requesting Physician:  Dr Reesa Chew  Reason for Consult:  Hyponatremia HPI: The patient is a 84 y.o. year-old with hx of HTN, gerd, and afib on coumadin and BB presented feeling "tired and weak".  In eD was found to have low Na 124 and bradycardia.  Pt was admitted.  rec'd NS bolus overnight and fluid restriction.  Na today is 128.   Uosm was 420 and UNa was 70.  Asked to see for hyponatremia.   Pt doesn't eat much due to issues w/ reflux, regurgitation.  No abd pain.  No hx CHF, liver disease or ckd.  No SSRI.  Poor appetite.    ROS  denies CP  no joint pain   no HA  no blurry vision  no rash  no diarrhea  no dysuria  no difficulty voiding  no change in urine color    Past Medical History  Past Medical History:  Diagnosis Date  . Anemia   . Anxiety   . Arthritis    hips, knees  . Atrial fibrillation ([place])   . Current use of long term anticoagulation   . Diverticulitis of colon 02/06/2015  . Diverticulosis   . Gait difficulty   . GERD (gastroesophageal reflux disease)   . Headache(784.0) 06/22/2012  . Hyperlipidemia   . Hypertension   . IBS (irritable bowel syndrome)   . Incontinence   . Loss of weight 06/05/2015  . Medicare annual wellness visit, subsequent 02/05/2014  . Melena   . Mitral and aortic regurgitation   . Mitral valve prolapse    hx of  . Osteopenia   . Pedal edema 10/01/2016  . Personal history of colonic polyps   . Renal lithiasis 06/26/2015  . Thyroid disease    hypo  . Unspecified adverse effect of other drug, medicinal and biological substance(995.29)   . Vitamin B12 deficiency 03/16/2016  . Vitamin D deficiency    Past Surgical History  Past Surgical History:  Procedure Laterality Date  . BOTOX INJECTION N/A 09/03/2012   Procedure: BOTOX INJECTION;  Surgeon: Inda Castle, MD;  Location: [facility];  Service: Endoscopy;  Laterality: N/A;  . BREAST  BIOPSY Left   . BREAST LUMPECTOMY Right 08/18/2014   Procedure: RIGHT BREAST LUMPECTOMY;  Surgeon: Coralie Keens, MD;  Location: [facility];  Service: General;  Laterality: Right;  . CARDIAC ELECTROPHYSIOLOGY MAPPING AND ABLATION    . CATARACT EXTRACTION, BILATERAL    . ESOPHAGOGASTRODUODENOSCOPY N/A 09/03/2012   Procedure: ESOPHAGOGASTRODUODENOSCOPY (EGD);  Surgeon: Inda Castle, MD;  Location: [facility];  Service: Endoscopy;  Laterality: N/A;  . ESOPHAGOSCOPY W/ BOTOX INJECTION    . I&D EXTREMITY Right 11/06/2012   Procedure: IRRIGATION AND DEBRIDEMENT EXTREMITY Right Ring Finger;  Surgeon: Tennis Must, MD;  Location: [facility];  Service: Orthopedics;  Laterality: Right;  . PARTIAL HYSTERECTOMY     ovaries left in place   Family History  Family History  Problem Relation Age of Onset  . Diabetes Mother   . CVA Mother   . Stroke Mother   . COPD Father   . Rheum arthritis Father   . Allergies Daughter   . COPD Daughter   . Stroke Daughter   . COPD Daughter        previous smoker  . Stroke Maternal Grandfather   . Heart disease Maternal Aunt   . Heart disease Maternal Uncle   .  Colon cancer Neg Hx   . Colon polyps Neg Hx   . Esophageal cancer Neg Hx   . Gallbladder disease Neg Hx   . Kidney disease Neg Hx   . Heart attack Neg Hx   . Hypertension Neg Hx    Social History  reports that she has never smoked. She has never used smokeless tobacco. She reports that she does not drink alcohol or use drugs. Allergies  Allergies  Allergen Reactions  . Darifenacin Hydrobromide     REACTION: causes her dizziness  . Sulfonamide Derivatives Other (See Comments)    Pt thinks it caused dizziness  . Tramadol Other (See Comments)    Insomnia, anorexia   Home medications Prior to Admission medications   Medication Sig Start Date End Date Taking? Authorizing Provider  ALPRAZolam (XANAX) 0.25 MG tablet Take 0.5-1 tablets (0.125-0.25 mg total) by mouth 2 (two) times  daily as needed for anxiety. 05/09/17  Yes Mosie Lukes, MD  Cholecalciferol (VITAMIN D) 2000 UNITS CAPS Take 2,000 Units by mouth every other day. 02/20/12  Yes Mosie Lukes, MD  Cyanocobalamin (VITAMIN B-12) 500 MCG SUBL Place 1 tablet (500 mcg total) under the tongue daily at 2 PM. 06/22/16  Yes Mosie Lukes, MD  metoprolol tartrate (LOPRESSOR) 25 MG tablet Take 1 tablet (25 mg total) by mouth 2 (two) times daily. 06/12/17  Yes Dorothy Spark, MD  pantoprazole (PROTONIX) 40 MG tablet TAKE 1 TABLET (40 MG TOTAL) BY MOUTH DAILY. 03/25/17  Yes Mosie Lukes, MD  ramipril (ALTACE) 10 MG capsule TAKE 1 CAPSULE (10 MG TOTAL) BY MOUTH DAILY. 06/11/17  Yes Mosie Lukes, MD  warfarin (COUMADIN) 2.5 MG tablet TAKE AS DIRECTED BY ANTICOAGULATION CLINIC. A 30 DAY SUPPLY 11/07/16  Yes Dorothy Spark, MD  azelastine (ASTELIN) 0.1 % nasal spray Place 2 sprays into both nostrils 2 (two) times daily. Use in each nostril as directed Patient not taking: Reported on 06/29/2017 05/09/17   Mosie Lukes, MD  furosemide (LASIX) 20 MG tablet Take 1 tablet (20 mg total) by mouth daily as needed. Patient not taking: Reported on 06/29/2017 06/12/17   Dorothy Spark, MD   Liver Function Tests Recent Labs  Lab 06/27/17 1543 06/29/17 1500  AST 14 17  ALT 6 9  ALKPHOS 15* 17*  BILITOT 0.4 0.6  PROT 6.7 6.4*  ALBUMIN 3.8 3.1*   No results for input(s): LIPASE, AMYLASE in the last 168 hours. CBC Recent Labs  Lab 06/29/17 1500  WBC 7.4  HGB 11.9*  HCT 33.5*  MCV 84.8  PLT 951   Basic Metabolic Panel Recent Labs  Lab 06/27/17 1543 06/29/17 1542 06/30/17 0425  NA 124* 123* 128*  K 5.1 4.2 4.6  CL 89* 91* 94*  CO2 28 24 24   GLUCOSE 101* 137* 100*  BUN 13 12 11   CREATININE 0.89 0.77 0.89  CALCIUM 8.4 7.4* 8.5*   Iron/TIBC/Ferritin/ %Sat    Component Value Date/Time   IRON 68 02/18/2012 0858   IRONPCTSAT 20.6 02/18/2012 0858    Vitals:   06/29/17 2346 06/30/17 0500 06/30/17 0955  06/30/17 1243  BP: (!) 161/63 140/63 136/84 139/65  Pulse: 66 69  69  Resp:  16  16  Temp:  98.7 F (37.1 C)  98.3 F (36.8 C)  TempSrc:  Oral  Oral  SpO2:  97%  98%  Weight:      Height:       Exam Gen small framed  eldelry WF, no distress, calm No rash, cyanosis or gangrene Sclera anicteric, throat clear  No jvd or bruits Chest clear bilat to bases RRR no MRG Abd soft ntnd no mass or ascites +bs GU defer MS no joint effusions or deformity Ext no LE or UE edema, no wounds or ulcers Neuro is alert, Ox 3 , nf    Home meds:  - metoprolol 25 bid/ ramipril 10 qd/ lasix 20 qd prn  - xanax prn/ protonix  - coumadin    UNa 70   Ucreat 99.16   Uosm 421   UA > negative   Na 123 > 128 overnight   BUN 11 Cr 0.89  Ca 8.5    Impression: 1  Hyponatremia - urine Na high and Uosm high as well.  Suspect reset osmostat or SIADH.  Poor appetite at home related to #5.  Na is improving w/ fluid restriction.  Salt tabs added this am. Would cont this regimen for now.   2  Bradycardia 3  Atrial fib, paroxysmal 4  Hypertension - cont meds 5  GERD - has problems w/ appetite/ reflux/ regurgitation, says she doesn't eat much which can contribute to reset osmostat.     Plan - will follow.    Kelly Splinter MD [employer] pager [phone]   06/30/2017, 7:24 PM

## 2017-06-30 NOTE — Progress Notes (Signed)
Patient at 2.28 second pause on tele. Patient resting.

## 2017-06-30 NOTE — Progress Notes (Signed)
PROGRESS NOTE    [name] [name]  WIO:[id number] DOB: [birth year] DOA: 06/29/2017 PCP: Mosie Lukes, MD   Brief Narrative:  84 year old with a history of A. fib on Coumadin, essential hypertension was brought to the hospital for generalized weakness and decreased appetite.  She was also noted to be slightly bradycardic and hyponatremic therefore admitted to the hospital   Assessment & Plan:   Principal Problem:   Hyponatremia Active Problems:   Essential hypertension   PAROXYSMAL ATRIAL FIBRILLATION   Chronic anticoagulation   Bradycardia   Hyponatremia - Unknown exact etiology of this.  Currently she is on fluid restriction and on salt tablet for reset osmole stat.  Sodium is slightly improved.  We will continue to monitor this.  History of paroxysmal atrial fibrillation Bradycardia, asymptomatic -Patient's heart rate has dropped as low as 30.  At this time we will continue to hold metoprolol and monitor on telemetry.  Will let metoprolol washout from her system and then if she remains bradycardic we will consult cardiology for their input - Continue Coumadin-pharmacy to dose  Essential hypertension -Resume home medications- Cont ramipril 10mg  po daily.  PRN hydralazine  Mild to moderate protein calorie malnutrition -Dietitian consult.  DVT prophylaxis: Coumadin  Code Status: Full  Family Communication:  None at bedside  Disposition Plan: maintain inpatient stay for tx fo Bradycardia and HypoNa  Consultants:   Cursided nephro by the admitting doc.   Procedures:   None   Antimicrobials:   None   Subjective: No complaints. Sitting up eating breakfast.   Review of Systems Otherwise negative except as per HPI, including: General = no fevers, chills, dizziness, malaise, fatigue HEENT/EYES = negative for pain, redness, loss of vision, double vision, blurred vision, loss of hearing, sore throat, hoarseness, dysphagia Cardiovascular= negative for chest pain,  palpitation, murmurs, lower extremity swelling Respiratory/lungs= negative for shortness of breath, cough, hemoptysis, wheezing, mucus production Gastrointestinal= negative for nausea, vomiting,, abdominal pain, melena, hematemesis Genitourinary= negative for Dysuria, Hematuria, Change in Urinary Frequency MSK = Negative for arthralgia, myalgias, Back Pain, Joint swelling  Neurology= Negative for headache, seizures, numbness, tingling  Psychiatry= Negative for anxiety, depression, suicidal and homocidal ideation Allergy/Immunology= Medication/Food allergy as listed  Skin= Negative for Rash, lesions, ulcers, itching   Objective: Vitals:   06/29/17 2223 06/29/17 2346 06/30/17 0500 06/30/17 0955  BP: (!) 188/60 (!) 161/63 140/63 136/84  Pulse: (!) 58 66 69   Resp: 18  16   Temp: 98.3 F (36.8 C)  98.7 F (37.1 C)   TempSrc: Oral  Oral   SpO2: 100%  97%   Weight:      Height:        Intake/Output Summary (Last 24 hours) at 06/30/2017 1037 Last data filed at 06/29/2017 2058 Gross per 24 hour  Intake 500 ml  Output -  Net 500 ml   Filed Weights   06/29/17 1642 06/29/17 2219  Weight: 42.2 kg (93 lb) 42.4 kg (93 lb 7.6 oz)    Examination:  General exam: Appears calm and comfortable; elderly weak, temporal wasting.  Respiratory system: Clear to auscultation. Respiratory effort normal. Cardiovascular system: S1 & S2 heard, RRR. No JVD, murmurs, rubs, gallops or clicks. No pedal edema. Gastrointestinal system: Abdomen is nondistended, soft and nontender. No organomegaly or masses felt. Normal bowel sounds heard. Central nervous system: Alert and oriented. No focal neurological deficits. Extremities: Symmetric 4 x 5 power. Skin: No rashes, lesions or ulcers Psychiatry: Judgement and insight appear normal. Mood &  affect appropriate.     Data Reviewed:   CBC: Recent Labs  Lab 06/29/17 1500  WBC 7.4  HGB 11.9*  HCT 33.5*  MCV 84.8  PLT 224   Basic Metabolic  Panel: Recent Labs  Lab 06/27/17 1543 06/29/17 1542 06/30/17 0425  NA 124* 123* 128*  K 5.1 4.2 4.6  CL 89* 91* 94*  CO2 28 24 24   GLUCOSE 101* 137* 100*  BUN 13 12 11   CREATININE 0.89 0.77 0.89  CALCIUM 8.4 7.4* 8.5*  MG  --  1.9  --    GFR: Estimated Creatinine Clearance: 27.6 mL/min (by C-G formula based on SCr of 0.89 mg/dL). Liver Function Tests: Recent Labs  Lab 06/27/17 1543 06/29/17 1500  AST 14 17  ALT 6 9  ALKPHOS 15* 17*  BILITOT 0.4 0.6  PROT 6.7 6.4*  ALBUMIN 3.8 3.1*   No results for input(s): LIPASE, AMYLASE in the last 168 hours. No results for input(s): AMMONIA in the last 168 hours. Coagulation Profile: Recent Labs  Lab 06/29/17 1500  INR 1.91   Cardiac Enzymes: Recent Labs  Lab 06/29/17 1500 06/29/17 2057  CKTOTAL 27*  --   TROPONINI  --  <0.03   BNP (last 3 results) No results for input(s): PROBNP in the last 8760 hours. HbA1C: No results for input(s): HGBA1C in the last 72 hours. CBG: No results for input(s): GLUCAP in the last 168 hours. Lipid Profile: No results for input(s): CHOL, HDL, LDLCALC, TRIG, CHOLHDL, LDLDIRECT in the last 72 hours. Thyroid Function Tests: No results for input(s): TSH, T4TOTAL, FREET4, T3FREE, THYROIDAB in the last 72 hours. Anemia Panel: No results for input(s): VITAMINB12, FOLATE, FERRITIN, TIBC, IRON, RETICCTPCT in the last 72 hours. Sepsis Labs: No results for input(s): PROCALCITON, LATICACIDVEN in the last 168 hours.  No results found for this or any previous visit (from the past 240 hour(s)).       Radiology Studies: No results found.      Scheduled Meds: . feeding supplement (ENSURE ENLIVE)  237 mL Oral BID BM  . ramipril  10 mg Oral Daily  . sodium chloride  2 g Oral BID WC  . warfarin  3 mg Oral ONCE-1800  . Warfarin - Pharmacist Dosing Inpatient   Does not apply q1800   Continuous Infusions:   LOS: 1 day    I have spent 25 minutes face to face with the patient and on the  ward discussing the patients care, assessment, plan and disposition with other care givers. >50% of the time was devoted counseling the patient about the risks and benefits of treatment and coordinating care.     Grove Defina Arsenio Loader, MD [facility] Pager [phone]   If 7PM-7AM, please contact night-coverage [email or web address] Password [facility] 06/30/2017, 10:37 AM

## 2017-07-01 ENCOUNTER — Inpatient Hospital Stay (HOSPITAL_COMMUNITY): Payer: Medicare Other

## 2017-07-01 DIAGNOSIS — R1011 Right upper quadrant pain: Secondary | ICD-10-CM

## 2017-07-01 DIAGNOSIS — R1031 Right lower quadrant pain: Secondary | ICD-10-CM

## 2017-07-01 DIAGNOSIS — R0789 Other chest pain: Secondary | ICD-10-CM

## 2017-07-01 DIAGNOSIS — I480 Paroxysmal atrial fibrillation: Secondary | ICD-10-CM

## 2017-07-01 DIAGNOSIS — E871 Hypo-osmolality and hyponatremia: Secondary | ICD-10-CM

## 2017-07-01 DIAGNOSIS — Z7901 Long term (current) use of anticoagulants: Secondary | ICD-10-CM

## 2017-07-01 LAB — CBC
HCT: 34.7 % — ABNORMAL LOW (ref 36.0–46.0)
Hemoglobin: 11.9 g/dL — ABNORMAL LOW (ref 12.0–15.0)
MCH: 29.6 pg (ref 26.0–34.0)
MCHC: 34.3 g/dL (ref 30.0–36.0)
MCV: 86.3 fL (ref 78.0–100.0)
Platelets: 228 10*3/uL (ref 150–400)
RBC: 4.02 MIL/uL (ref 3.87–5.11)
RDW: 14.6 % (ref 11.5–15.5)
WBC: 8.5 10*3/uL (ref 4.0–10.5)

## 2017-07-01 LAB — BASIC METABOLIC PANEL
ANION GAP: 10 (ref 5–15)
Anion gap: 8 (ref 5–15)
BUN: 15 mg/dL (ref 8–23)
BUN: 18 mg/dL (ref 8–23)
CALCIUM: 8.9 mg/dL (ref 8.9–10.3)
CHLORIDE: 90 mmol/L — AB (ref 98–111)
CO2: 29 mmol/L (ref 22–32)
CO2: 26 mmol/L (ref 22–32)
CREATININE: 0.96 mg/dL (ref 0.44–1.00)
Calcium: 9 mg/dL (ref 8.9–10.3)
Chloride: 94 mmol/L — ABNORMAL LOW (ref 98–111)
Creatinine, Ser: 1.12 mg/dL — ABNORMAL HIGH (ref 0.44–1.00)
GFR calc Af Amer: 58 mL/min — ABNORMAL LOW (ref >60)
GFR calc non Af Amer: 50 mL/min — ABNORMAL LOW (ref >60)
GFR, EST AFRICAN AMERICAN: 48 mL/min — AB (ref >60)
GFR, EST NON AFRICAN AMERICAN: 42 mL/min — AB (ref >60)
Glucose, Bld: 120 mg/dL — ABNORMAL HIGH (ref 70–99)
Glucose, Bld: 154 mg/dL — ABNORMAL HIGH (ref 70–99)
Potassium: 4.5 mmol/L (ref 3.5–5.1)
Potassium: 4.1 mmol/L (ref 3.5–5.1)
SODIUM: 127 mmol/L — AB (ref 135–145)
Sodium: 130 mmol/L — ABNORMAL LOW (ref 135–145)

## 2017-07-01 LAB — PROTIME-INR
INR: 2.02
Prothrombin Time: 22.7 seconds — ABNORMAL HIGH (ref 11.4–15.2)

## 2017-07-01 LAB — TSH: TSH: 2.911 u[IU]/mL (ref 0.350–4.500)

## 2017-07-01 LAB — MAGNESIUM: Magnesium: 2.1 mg/dL (ref 1.7–2.4)

## 2017-07-01 LAB — SODIUM: Sodium: 136 mmol/L (ref 135–145)

## 2017-07-01 LAB — H. PYLORI BREATH TEST: H. pylori Breath Test: NOT DETECTED

## 2017-07-01 MED ORDER — METOPROLOL SUCCINATE ER 25 MG PO TB24
12.5000 mg | ORAL_TABLET | Freq: Every day | ORAL | Status: DC
  Administered 2017-07-01 – 2017-07-02 (×2): 12.5 mg via ORAL
  Filled 2017-07-01 (×2): qty 1

## 2017-07-01 MED ORDER — WARFARIN SODIUM 2.5 MG PO TABS
2.5000 mg | ORAL_TABLET | Freq: Once | ORAL | Status: AC
  Administered 2017-07-01: 2.5 mg via ORAL
  Filled 2017-07-01: qty 1

## 2017-07-01 MED ORDER — PANTOPRAZOLE SODIUM 40 MG PO TBEC
40.0000 mg | DELAYED_RELEASE_TABLET | Freq: Two times a day (BID) | ORAL | Status: DC
  Administered 2017-07-01 – 2017-07-02 (×2): 40 mg via ORAL
  Filled 2017-07-01 (×2): qty 1

## 2017-07-01 MED ORDER — TOLVAPTAN 15 MG PO TABS
15.0000 mg | ORAL_TABLET | ORAL | Status: DC
  Administered 2017-07-01: 15 mg via ORAL
  Filled 2017-07-01 (×2): qty 1

## 2017-07-01 MED ORDER — GI COCKTAIL ~~LOC~~: 30.0000 mL | Freq: Three times a day (TID) | ORAL | Status: DC | PRN

## 2017-07-01 NOTE — Progress Notes (Signed)
First dose of tolvaptan administered to patient.  Patient education given to patient in a handout as ordered by Dr. Posey Pronto prior to administering the medication.  Patient stated she read through the handout and accepted to take the medication. Will continue to monitor patient.

## 2017-07-01 NOTE — Progress Notes (Signed)
Patient's daughter called RN to room stating that patient had 'spit up a little bit'.  There was a small amount of clear emesis with bits of solids in her emesis basin.  It was unclear if she had spit back up her pills.  Patient stated the pills felt like they got stuck and she tried to clear it with a sip of Ensure upon which she sort of gagged and spit up.  Denies nausea.  Will continue to monitor.

## 2017-07-01 NOTE — Consult Note (Addendum)
Cardiology Consultation:   Patient ID: LASHONTA PILLING; [id number]; 01-Dec-1924   Admit date: 06/29/2017 Date of Consult: 07/01/2017  Primary Care Provider: Mosie Lukes, MD Primary Cardiologist: Ena Dawley, MD  Chief Complaint: "just didn't feel good"  Patient Profile:   KRISTOPHER DELK is a 84 y.o. female with a hx of persistent atrial fibrillation on Coumadin per chart, also atrial flutter (by my review), sinus bradycardia, HTN, HLD, anemia, anxiety, arthritis, diverticulosis, GERD, thyroid disease, low body weight who is being seen today for the evaluation of bradycardia at the request of Dr. Alcario Drought  History of Present Illness:   Notes indicate prior chronic atrial fib but most recent OVs have demonstrated sinus bradycardia. The last previous arrhythmic EKG I can find is from 2017 when it appeared to be more like atrial flutter with variable block. She was managed with rate control only, and then EKGs in 2018 and just recently 06/12/17 showed sinus bradycardia. Last 2D Echo 12/10/16 showed EF 55-60%, mild MR/TR, mod LAE. She has had significant weight loss over the years.  Pt doesn't eat much due to issues with reflux/regurgitation and is only 93lb now.  She was recently seen in the ER 06/20/17 with discomfort in chest, upper abdomen and back. CT angio chest/abd/pelvix showed no acute dissection/aneurysm, + atherosclerotic vascular disease with suspected renal artery stenosis, possible 16 mm hyperenhancing splenic mass, sigmoid diverticular disease -> OP w/u of splenic mass recommended.  She returned to the ER with generalized malaise and was found to be hyponatremic. She had not had any further chest discomfort but did have some flank discomfort. Na 123->128->127 this admission (was 134 on 6/14 and 6/20), troponin neg, Hgb 11.9 c/w prior, Mg 1.21, INR 1.91->2.02, albumin 3.1. Otherwise recent labs showed CK 27, albumin 3.1. Tmax 99.9 this AM. IM has felt her hyponatremia looks like  SIADH from urine lytes although urine sodium higher than expected.. Nephrology has seen and suspects reset osmostat or SIADH and recommended continued fluid restriction and addition of salt tabs (2g BID). She was also noted to be bradycardic in the 30s with what appears to be atrial flutter with variable block so metoprolol was held. She currently appears to be in in atrial flutter with HR 70s - did have one brief episode of HR 120s but this was short lived. She is feeling much better today. Denies any recent syncope, dizziness, falling, dyspnea. She denies any recurrent chest pain to me. IM has asked GI to see for severe heartburn at times and difficulty swallowing - as above has had issues regurgitating as well.   Past Medical History:  Diagnosis Date  . Anemia   . Anxiety   . Arthritis    hips, knees  . Atrial fibrillation ([place])   . Current use of long term anticoagulation   . Diverticulitis of colon 02/06/2015  . Diverticulosis   . Gait difficulty   . GERD (gastroesophageal reflux disease)   . Headache(784.0) 06/22/2012  . Hyperlipidemia   . Hypertension   . IBS (irritable bowel syndrome)   . Incontinence   . Loss of weight 06/05/2015  . Medicare annual wellness visit, subsequent 02/05/2014  . Melena   . Mitral and aortic regurgitation   . Mitral valve prolapse    hx of  . Osteopenia   . Pedal edema 10/01/2016  . Personal history of colonic polyps   . Renal lithiasis 06/26/2015  . Thyroid disease    hypo  . Unspecified adverse effect of other  drug, medicinal and biological substance(995.29)   . Vitamin B12 deficiency 03/16/2016  . Vitamin D deficiency     Past Surgical History:  Procedure Laterality Date  . BOTOX INJECTION N/A 09/03/2012   Procedure: BOTOX INJECTION;  Surgeon: Inda Castle, MD;  Location: [facility];  Service: Endoscopy;  Laterality: N/A;  . BREAST BIOPSY Left   . BREAST LUMPECTOMY Right 08/18/2014   Procedure: RIGHT BREAST LUMPECTOMY;  Surgeon: Coralie Keens, MD;  Location: [facility];  Service: General;  Laterality: Right;  . CARDIAC ELECTROPHYSIOLOGY MAPPING AND ABLATION    . CATARACT EXTRACTION, BILATERAL    . ESOPHAGOGASTRODUODENOSCOPY N/A 09/03/2012   Procedure: ESOPHAGOGASTRODUODENOSCOPY (EGD);  Surgeon: Inda Castle, MD;  Location: [facility];  Service: Endoscopy;  Laterality: N/A;  . ESOPHAGOSCOPY W/ BOTOX INJECTION    . I&D EXTREMITY Right 11/06/2012   Procedure: IRRIGATION AND DEBRIDEMENT EXTREMITY Right Ring Finger;  Surgeon: Tennis Must, MD;  Location: [facility];  Service: Orthopedics;  Laterality: Right;  . PARTIAL HYSTERECTOMY     ovaries left in place     Inpatient Medications: Scheduled Meds: . cholecalciferol  2,000 Units Oral QODAY  . feeding supplement (ENSURE ENLIVE)  237 mL Oral BID BM  . pantoprazole  40 mg Oral BID AC  . ramipril  10 mg Oral Daily  . sodium chloride  2 g Oral BID WC  . Warfarin - Pharmacist Dosing Inpatient   Does not apply q1800   Continuous Infusions:  PRN Meds: acetaminophen **OR** acetaminophen, gi cocktail, hydrALAZINE, ondansetron **OR** ondansetron (ZOFRAN) IV  Home Meds: Prior to Admission medications   Medication Sig Start Date End Date Taking? Authorizing Provider  ALPRAZolam (XANAX) 0.25 MG tablet Take 0.5-1 tablets (0.125-0.25 mg total) by mouth 2 (two) times daily as needed for anxiety. 05/09/17  Yes Mosie Lukes, MD  Cholecalciferol (VITAMIN D) 2000 UNITS CAPS Take 2,000 Units by mouth every other day. 02/20/12  Yes Mosie Lukes, MD  Cyanocobalamin (VITAMIN B-12) 500 MCG SUBL Place 1 tablet (500 mcg total) under the tongue daily at 2 PM. 06/22/16  Yes Mosie Lukes, MD  metoprolol tartrate (LOPRESSOR) 25 MG tablet Take 1 tablet (25 mg total) by mouth 2 (two) times daily. 06/12/17  Yes Dorothy Spark, MD  pantoprazole (PROTONIX) 40 MG tablet TAKE 1 TABLET (40 MG TOTAL) BY MOUTH DAILY. 03/25/17  Yes Mosie Lukes, MD  ramipril (ALTACE) 10 MG capsule TAKE  1 CAPSULE (10 MG TOTAL) BY MOUTH DAILY. 06/11/17  Yes Mosie Lukes, MD  warfarin (COUMADIN) 2.5 MG tablet TAKE AS DIRECTED BY ANTICOAGULATION CLINIC. A 30 DAY SUPPLY 11/07/16  Yes Dorothy Spark, MD  azelastine (ASTELIN) 0.1 % nasal spray Place 2 sprays into both nostrils 2 (two) times daily. Use in each nostril as directed Patient not taking: Reported on 06/29/2017 05/09/17   Mosie Lukes, MD  furosemide (LASIX) 20 MG tablet Take 1 tablet (20 mg total) by mouth daily as needed. Patient not taking: Reported on 06/29/2017 06/12/17   Dorothy Spark, MD    Allergies:    Allergies  Allergen Reactions  . Darifenacin Hydrobromide     REACTION: causes her dizziness  . Sulfonamide Derivatives Other (See Comments)    Pt thinks it caused dizziness  . Tramadol Other (See Comments)    Insomnia, anorexia    Social History:   Social History   Socioeconomic History  . Marital status: Widowed    Spouse name: Not on  file  . Number of children: 5  . Years of education: Not on file  . Highest education level: Not on file  Occupational History  . Occupation: retired    Fish farm manager: RETIRED  Social Needs  . Financial resource strain: Not on file  . Food insecurity:    Worry: Not on file    Inability: Not on file  . Transportation needs:    Medical: Not on file    Non-medical: Not on file  Tobacco Use  . Smoking status: Never Smoker  . Smokeless tobacco: Never Used  Substance and Sexual Activity  . Alcohol use: No  . Drug use: No  . Sexual activity: Not on file    Comment: lives with Daughter, grandson and his family. no dietary restricitons.  Lifestyle  . Physical activity:    Days per week: Not on file    Minutes per session: Not on file  . Stress: Not on file  Relationships  . Social connections:    Talks on phone: Not on file    Gets together: Not on file    Attends religious service: Not on file    Active member of club or organization: Not on file    Attends meetings of  clubs or organizations: Not on file    Relationship status: Not on file  . Intimate partner violence:    Fear of current or ex partner: Not on file    Emotionally abused: Not on file    Physically abused: Not on file    Forced sexual activity: Not on file  Other Topics Concern  . Not on file  Social History Narrative  . Not on file    Family History:   The patient's family history includes Allergies in her daughter; COPD in her daughter, daughter, and father; CVA in her mother; Diabetes in her mother; Heart disease in her maternal aunt and maternal uncle; Rheum arthritis in her father; Stroke in her daughter, maternal grandfather, and mother. There is no history of Colon cancer, Colon polyps, Esophageal cancer, Gallbladder disease, Kidney disease, Heart attack, or Hypertension.  ROS:  Please see the history of present illness.  All other ROS reviewed and negative.     Physical Exam/Data:   Vitals:   06/30/17 0955 06/30/17 1243 06/30/17 2028 07/01/17 0548  BP: 136/84 139/65 (!) 135/59 (!) 142/76  Pulse:  69 71 (!) 116  Resp:  16 18 16   Temp:  98.3 F (36.8 C) 98.9 F (37.2 C) 99.9 F (37.7 C)  TempSrc:  Oral Oral Oral  SpO2:  98% 96% 96%  Weight:      Height:        Intake/Output Summary (Last 24 hours) at 07/01/2017 1053 Last data filed at 07/01/2017 0842 Gross per 24 hour  Intake 620 ml  Output -  Net 620 ml   Filed Weights   06/29/17 1642 06/29/17 2219  Weight: 93 lb (42.2 kg) 93 lb 7.6 oz (42.4 kg)   Body mass index is 18.88 kg/m.  General Elderly frail appearing WF in no acute distress. Head: Normocephalic, atraumatic, sclera non-icteric, no xanthomas, nares are without discharge Neck: Negative for carotid bruits. JVD not elevated. Lungs: Clear bilaterally to auscultation without wheezes, rales, or rhonchi. Breathing is unlabored. Heart: Regularly irregular with S1 S2. No murmurs, rubs, or gallops appreciated. Abdomen: Soft, non-tender, non-distended with  normoactive bowel sounds. No hepatomegaly. No rebound/guarding. No obvious abdominal masses. Msk:  Significant kyphosis noted Extremities: No clubbing or cyanosis. No  edema.  Distal pedal pulses are 2+ and equal bilaterally. Neuro: Alert and oriented X 3. No facial asymmetry. No focal deficit. Moves all extremities spontaneously. Psych:  Responds to questions appropriately with a normal affect.  EKG:   Initial tracing: atrial flutter 53bpm F/u EKG: atrial flutter 36bpm Possible prior anteroseptal infarct, non specific ST Tchanges  Relevant CV Studies: As above  Laboratory Data:  Chemistry Recent Labs  Lab 06/29/17 1542 06/30/17 0425 07/01/17 0804  NA 123* 128* 127*  K 4.2 4.6 4.5  CL 91* 94* 90*  CO2 24 24 29   GLUCOSE 137* 100* 120*  BUN 12 11 15   CREATININE 0.77 0.89 0.96  CALCIUM 7.4* 8.5* 9.0  GFRNONAA >60 55* 50*  GFRAA >60 >60 58*  ANIONGAP 8 10 8     Recent Labs  Lab 06/27/17 1543 06/29/17 1500  PROT 6.7 6.4*  ALBUMIN 3.8 3.1*  AST 14 17  ALT 6 9  ALKPHOS 15* 17*  BILITOT 0.4 0.6   Hematology Recent Labs  Lab 06/29/17 1500 07/01/17 0804  WBC 7.4 8.5  RBC 3.95 4.02  HGB 11.9* 11.9*  HCT 33.5* 34.7*  MCV 84.8 86.3  MCH 30.1 29.6  MCHC 35.5 34.3  RDW 14.1 14.6  PLT 223 228   Cardiac Enzymes Recent Labs  Lab 06/29/17 2057  TROPONINI <0.03   No results for input(s): TROPIPOC in the last 168 hours.   Radiology/Studies:  No results found.  Assessment and Plan:   1. Malaise with hyponatremia, being evaluated for SIADH versus reset osmostat - per IM/nephrology. CT chest 1 week ago did not show any specific pulm process, will defer to primary teams to continue to evaluate.  2. H/o persistent atrial fib, with atrial flutter by my eye with bradycardia this admission - previously managed with rate controlling strategy, in sinus brady at recent OV. Question whether some of her weakness was related to bradycardia. Agree with holding of metoprolol. Would  watch rates on telemetry. She had brief 2:1 conduction with HR 120s but HR overall largely controlled off beta blocker now. It appears she remains in atrial flutter. Will f/u EKG. If need be, can try adding back low dose Toprol like 12.5mg  daily. She is maintained on Coumadin for CHADSVASC 4. Check TSH for completeness.  3. Mild chronic appearing anemia - follow. Similar to OP values.  4. Splenic mass - per IM. Pt is also noted to have ongoing weight loss for last several years in setting of acid reflux and regurgitation.  For questions or updates, please contact [place] Please consult [email or web address] for contact info under Cardiology/STEMI.    Signed, Charlie Pitter, PA-C  07/01/2017 10:53 AM   Personally seen and examined. Agree with above.  Feeling better, less short of breath, no chest pain  GEN: Thin in no acute distress  HEENT: normal  Neck: no JVD, carotid bruits, or masses Cardiac: irreg; no murmurs, rubs, or gallops,no edema  Respiratory:  clear to auscultation bilaterally, normal work of breathing GI: soft, nontender, nondistended, + BS MS: no deformity or atrophy  Skin: warm and dry, no rash Neuro:  Alert and Oriented x 3, Strength and sensation are intact Psych: euthymic mood, full affect  EKG/telemetry personally reviewed- atrial flutter appearing.  Currently heart rate 117 bpm.  Assessment and plan:  Atrial flutter -Continue with rate control.  I agree with initiation of low-dose Toprol 12.5 mg once a day.  Watch for bradycardia.  Chronic anticoagulation -Continue with warfarin.  Hyponatremia -Per  primary team.  Severe protein calorie malnutrition - Has had trouble with nausea and vomiting.  Continue to encourage protein intake.   Candee Furbish, MD

## 2017-07-01 NOTE — Progress Notes (Signed)
[place] for coumadin Indication: atrial fibrillation  Allergies  Allergen Reactions  . Darifenacin Hydrobromide     REACTION: causes her dizziness  . Sulfonamide Derivatives Other (See Comments)    Pt thinks it caused dizziness  . Tramadol Other (See Comments)    Insomnia, anorexia    Patient Measurements: Height: 4\' 11"  (149.9 cm) Weight: 93 lb 7.6 oz (42.4 kg) IBW/kg (Calculated) : 43.2   Vital Signs: Temp: 99.9 F (37.7 C) (07/01 0548) Temp Source: Oral (07/01 0548) BP: 142/76 (07/01 0548) Pulse Rate: 116 (07/01 0548)  Labs: Recent Labs    06/29/17 1500 06/29/17 1542 06/29/17 2057 06/30/17 0425 07/01/17 0804 07/01/17 0904  HGB 11.9*  --   --   --  11.9*  --   HCT 33.5*  --   --   --  34.7*  --   PLT 223  --   --   --  228  --   LABPROT 21.7*  --   --   --   --  22.7*  INR 1.91  --   --   --   --  2.02  CREATININE  --  0.77  --  0.89 0.96  --   CKTOTAL 27*  --   --   --   --   --   TROPONINI  --   --  <0.03  --   --   --     Estimated Creatinine Clearance: 25.5 mL/min (by C-G formula based on SCr of 0.96 mg/dL).   Assessment: 84 yo F on warfarin PTA for Afib. Admitted for weakness/FTT and hypoNa. Pharmacy consulted to dose warfarin while admitted.   Baseline INR slightly subtherapeutic  Prior anticoagulation: warfarin 2.5 mg daily except 3.75 mg on Mondays, LD 6/29 AM  Significant events:  Today, 07/01/2017:  CBC: Hgb low but looks to be at baseline  INR now just therapeutic  Major drug interactions: Ensure Enlive has 25% RDA Vit K  No bleeding issues per nursing  Eating 50% of meals  Goal of Therapy: INR 2-3  Plan:  Warfarin 2.5 mg PO tonight at 18:00. Reduced PO intake but will not be overly conservative with Vit K in Ensure unless INR shoots up tomorrow  Daily INR  CBC at least q72 hr while on warfarin  Monitor for signs of bleeding or thrombosis   Reuel Boom, PharmD,  BCPS [phone] 07/01/2017, 11:03 AM

## 2017-07-01 NOTE — Progress Notes (Signed)
Initial Nutrition Assessment  DOCUMENTATION CODES:   Not applicable  INTERVENTION:  - Continue Ensure Enlive BID, each supplement provides 350 kcal and 20 grams of protein. - Continue to encourage PO intakes.  - Will monitor for GI note and improvement in swallowing difficulty.   NUTRITION DIAGNOSIS:   Inadequate oral intake related to decreased appetite, other (see comment)(difficulty swallowing) as evidenced by per patient/family report.  GOAL:   Patient will meet greater than or equal to 90% of their needs  MONITOR:   PO intake, Supplement acceptance, Weight trends, Labs  REASON FOR ASSESSMENT:   Malnutrition Screening Tool, Consult Assessment of nutrition requirement/status  ASSESSMENT:   84 year old with a history of A. fib on Coumadin, essential hypertension was brought to the hospital for generalized weakness and decreased appetite.  She was also noted to be slightly bradycardic and hyponatremic therefore admitted to the hospital.  BMI indicates normal weight. Per chart review, patient consumed 50% of lunch 6/20 (165 kcal, 9 grams of protein) and 50% of breakfast this AM (180 kcal, 8 grams of protein). Patient was eating lunch (a bowl of soup and a cup of iced tea) at the time of RD visit. Opened bottle of Ensure was on bedside table.  Patient reports that about 1 month ago she began having difficulty with swallowing. This mainly occurred with items that were not soft and that required additional chewing. Difficulty was that she feels things are getting stuck in her throat. She needs to thoroughly chew items and take a drink behind them to make items go down. Encouraged patient to avoid raw fruits and vegetables, crunchy items, or meats that are not tender or that are not served with gravy or sauce.  She denies any chewing difficulty, abdominal pain/pressure, nausea, or taste changes now or PTA. She reports that swallowing difficulty has led to a decrease in appetite and  intakes and that she often takes a few bites of a meal and simply does not want any more; denies feelings of fullness/early satiety.   Noted that Dr. Latina Craver note from this AM states she had an EGD in 2014, plan to start on PPI before meals and PRN GI cocktail, and consult GI.   NFPE outlined below. Unable to determine if wasting is related to weight changes versus advanced age-related atrophy. Patient reports that 1 year or more ago (she is unable to recall exact time frame) she weighed 118 lbs. Based on current weight, this indicates 25 lb weight loss (21% body weight) in the past 1+ years. This is possibly significant but unable to state with certainty as patient is unable to recall exact time frame.   She has accepted 3 of 3 bottles of Ensure provided. At home she was drinking 1 Boost/day but stopped recently because someone told her it was too much on top of her taking vitamin and mineral supplements. Encouraged patient to drink 1 bottle of ONS/day as she has not been eating much.  Medications reviewed; 2000 units vitamin D/day, 40 mg oral Protonix BID, 2 g NaCl BID.  Labs reviewed; Na: 127 mmol/L, Cl: 90 mmol/L, GFR: 50 mL/min.       NUTRITION - FOCUSED PHYSICAL EXAM:    Most Recent Value  Orbital Region  Mild depletion  Upper Arm Region  Mild depletion  Thoracic and Lumbar Region  Unable to assess  Buccal Region  Mild depletion  Temple Region  Mild depletion  Clavicle Bone Region  Mild depletion  Clavicle and Acromion Bone Region  Moderate depletion  Scapular Bone Region  Unable to assess  Dorsal Hand  No depletion  Patellar Region  No depletion  Anterior Thigh Region  Unable to assess  Posterior Calf Region  Mild depletion  Edema (RD Assessment)  None  Hair  Reviewed  Eyes  Reviewed  Mouth  Reviewed  Skin  Reviewed  Nails  Reviewed       Diet Order:   Diet Order           Diet regular Room service appropriate? Yes; Fluid consistency: Thin; Fluid restriction: 1200 mL  Fluid  Diet effective now          EDUCATION NEEDS:   No education needs have been identified at this time  Skin:  Skin Assessment: Reviewed RN Assessment  Last BM:  6/29  Height:   Ht Readings from Last 1 Encounters:  06/29/17 4\' 11"  (1.499 m)    Weight:   Wt Readings from Last 1 Encounters:  06/29/17 93 lb 7.6 oz (42.4 kg)    Ideal Body Weight:  42.91 kg  BMI:  Body mass index is 18.88 kg/m.  Estimated Nutritional Needs:   Kcal:  1190-1315 (28-31 kcal/kg)  Protein:  45-55 grams  Fluid:  >/= 1 L/day     Jarome Matin, MS, RD, LDN, [facility] Inpatient Clinical Dietitian Pager # [phone] After hours/weekend pager # [phone]

## 2017-07-01 NOTE — Progress Notes (Signed)
PROGRESS NOTE    [name] [name]  SJG:[id number] DOB: 1924/07/24 DOA: 06/29/2017 PCP: Mosie Lukes, MD   Brief Narrative:  84 year old with a history of A. fib on Coumadin, essential hypertension was brought to the hospital for generalized weakness and decreased appetite.  She was also noted to be slightly bradycardic and hyponatremic therefore admitted to the hospital.   Assessment & Plan:   Principal Problem:   Hyponatremia Active Problems:   Essential hypertension   PAROXYSMAL ATRIAL FIBRILLATION   Chronic anticoagulation   Bradycardia   Hyponatremia - Elevated urine sodium and osmolality.  Suspecting SIADH.  This morning sodium remains at 127.  Nephrology has been consulted-appreciate their input.  We will follow-up their recommendation.  Dysphagia Atypical chest pain, likely GERD - Previously patient has suffered from achalasia and has had endoscopy in 2014.  Previously treated with Botox injection.  Will start patient on PPI before meal and GI cocktail as needed.  Will consult gastroenterology for their input.  History of paroxysmal atrial fibrillation Bradycardia, asymptomatic -Her heart rate is doing better now that she is off AV nodal blocker/beta-blocker.  Apparently overnight had an episode where her heart rate jumped up greater than 100.  Patient has been followed by cardiology in the past, will ask them to weigh in in case if she needs to be on low-dose beta-blocker versus keeping her off of it. - Continue Coumadin-pharmacy to dose  Essential hypertension -Resume home medications- Cont ramipril 10mg  po daily.  PRN hydralazine  Mild to moderate protein calorie malnutrition -Dietitian consult.  DVT prophylaxis: Coumadin  Code Status: Full  Family Communication: Spoke with the daughter over the phone Disposition Plan: Maintain inpatient stay.   Consultants:   Nephro   Cardiology   GI  Procedures:   None   Antimicrobials:    None   Subjective: No complaints besides severe heart burn at time and some diffculty swallowing.   Review of Systems Otherwise negative except as per HPI, including: General = no fevers, chills, dizziness, malaise, fatigue HEENT/EYES = negative for pain, redness, loss of vision, double vision, blurred vision, loss of hearing, sore throat, hoarseness, dysphagia Cardiovascular= negative for chest pain, palpitation, murmurs, lower extremity swelling Respiratory/lungs= negative for shortness of breath, cough, hemoptysis, wheezing, mucus production Gastrointestinal= negative for nausea, vomiting,, abdominal pain, melena, hematemesis Genitourinary= negative for Dysuria, Hematuria, Change in Urinary Frequency MSK = Negative for arthralgia, myalgias, Back Pain, Joint swelling  Neurology= Negative for headache, seizures, numbness, tingling  Psychiatry= Negative for anxiety, depression, suicidal and homocidal ideation Allergy/Immunology= Medication/Food allergy as listed  Skin= Negative for Rash, lesions, ulcers, itching   Objective: Vitals:   06/30/17 0955 06/30/17 1243 06/30/17 2028 07/01/17 0548  BP: 136/84 139/65 (!) 135/59 (!) 142/76  Pulse:  69 71 (!) 116  Resp:  16 18 16   Temp:  98.3 F (36.8 C) 98.9 F (37.2 C) 99.9 F (37.7 C)  TempSrc:  Oral Oral Oral  SpO2:  98% 96% 96%  Weight:      Height:        Intake/Output Summary (Last 24 hours) at 07/01/2017 1053 Last data filed at 07/01/2017 0842 Gross per 24 hour  Intake 620 ml  Output -  Net 620 ml   Filed Weights   06/29/17 1642 06/29/17 2219  Weight: 42.2 kg (93 lb) 42.4 kg (93 lb 7.6 oz)    Examination: Constitutional: NAD, calm, comfortable; elderly frail - b/l temporal wasting.  Eyes: PERRL, lids and conjunctivae normal ENMT: Mucous  membranes are moist. Posterior pharynx clear of any exudate or lesions.Normal dentition.  Neck: normal, supple, no masses, no thyromegaly Respiratory: clear to auscultation  bilaterally, no wheezing, no crackles. Normal respiratory effort. No accessory muscle use.  Cardiovascular: Regular rate and rhythm, no murmurs / rubs / gallops. No extremity edema. 2+ pedal pulses. No carotid bruits.  Abdomen: no tenderness, no masses palpated. No hepatosplenomegaly. Bowel sounds positive.  Musculoskeletal: no clubbing / cyanosis. No joint deformity upper and lower extremities. Good ROM, no contractures. Normal muscle tone.  Skin: no rashes, lesions, ulcers. No induration Neurologic: CN 2-12 grossly intact. Sensation intact, DTR normal. Strength 5/5 in all 4.  Psychiatric: Normal judgment and insight. Alert and oriented x 3. Normal mood.    Data Reviewed:   CBC: Recent Labs  Lab 06/29/17 1500 07/01/17 0804  WBC 7.4 8.5  HGB 11.9* 11.9*  HCT 33.5* 34.7*  MCV 84.8 86.3  PLT 223 818   Basic Metabolic Panel: Recent Labs  Lab 06/27/17 1543 06/29/17 1542 06/30/17 0425 07/01/17 0804  NA 124* 123* 128* 127*  K 5.1 4.2 4.6 4.5  CL 89* 91* 94* 90*  CO2 28 24 24 29   GLUCOSE 101* 137* 100* 120*  BUN 13 12 11 15   CREATININE 0.89 0.77 0.89 0.96  CALCIUM 8.4 7.4* 8.5* 9.0  MG  --  1.9  --  2.1   GFR: Estimated Creatinine Clearance: 25.5 mL/min (by C-G formula based on SCr of 0.96 mg/dL). Liver Function Tests: Recent Labs  Lab 06/27/17 1543 06/29/17 1500  AST 14 17  ALT 6 9  ALKPHOS 15* 17*  BILITOT 0.4 0.6  PROT 6.7 6.4*  ALBUMIN 3.8 3.1*   No results for input(s): LIPASE, AMYLASE in the last 168 hours. No results for input(s): AMMONIA in the last 168 hours. Coagulation Profile: Recent Labs  Lab 06/29/17 1500 07/01/17 0904  INR 1.91 2.02   Cardiac Enzymes: Recent Labs  Lab 06/29/17 1500 06/29/17 2057  CKTOTAL 27*  --   TROPONINI  --  <0.03   BNP (last 3 results) No results for input(s): PROBNP in the last 8760 hours. HbA1C: No results for input(s): HGBA1C in the last 72 hours. CBG: No results for input(s): GLUCAP in the last 168  hours. Lipid Profile: No results for input(s): CHOL, HDL, LDLCALC, TRIG, CHOLHDL, LDLDIRECT in the last 72 hours. Thyroid Function Tests: No results for input(s): TSH, T4TOTAL, FREET4, T3FREE, THYROIDAB in the last 72 hours. Anemia Panel: No results for input(s): VITAMINB12, FOLATE, FERRITIN, TIBC, IRON, RETICCTPCT in the last 72 hours. Sepsis Labs: No results for input(s): PROCALCITON, LATICACIDVEN in the last 168 hours.  No results found for this or any previous visit (from the past 240 hour(s)).       Radiology Studies: No results found.      Scheduled Meds: . cholecalciferol  2,000 Units Oral QODAY  . feeding supplement (ENSURE ENLIVE)  237 mL Oral BID BM  . pantoprazole  40 mg Oral BID AC  . ramipril  10 mg Oral Daily  . sodium chloride  2 g Oral BID WC  . Warfarin - Pharmacist Dosing Inpatient   Does not apply q1800   Continuous Infusions:   LOS: 2 days    I have spent 30 minutes face to face with the patient and on the ward discussing the patients care, assessment, plan and disposition with other care givers. >50% of the time was devoted counseling the patient about the risks and benefits of treatment and  coordinating care.     Ankit Arsenio Loader, MD [facility] Pager [phone]   If 7PM-7AM, please contact night-coverage [email or web address] Password [email or web address] 07/01/2017, 10:53 AM

## 2017-07-01 NOTE — Telephone Encounter (Signed)
Called but no answer, left another  message

## 2017-07-01 NOTE — Consult Note (Addendum)
Referring Provider: [facility]   Primary Care Physician:  Mosie Lukes, MD Primary Gastroenterologist:  Wilfrid Lund, MD  Reason for Consultation: chest pain.    ASSESSMENT AND PLAN:    84 yo female admitted with intermittent chest / upper abdominal pain radiating through to back and eventually settling into area under right shoulder blade. Liver chemistries, lipase remaining labs unremarkable as is chest CTA.  -No pain in a few days she says. Would like to get abdominal u/s today ( biliary origin of pain).  -If negative and still pain free then she can be discharged from GI standpoint on PPI.    HPI: [name] [name] is a 84 y.o. female with AFib on coumadin. She is known previously by Dr. [name], now followed by Dr. Loletha Carrow. She has a history of diverticulitis and a long-standing history of dysphagia.  She carries a diagnosis of achalasia and apparently received botox injections several years ago. Swallowing is still difficult at times, feels like food gets hung up. On a soft diet. Has to drink plenty of fluids, eats small bites and eat slowly. Weight down about 7 pounds since Feb. No lower GI complaints.   Patient thought she was taking Zantac for GERD but Protonix is listed on home med list. She usually regurgitates (in recumbent position) but doesn't have typical heartburn. When PCP started reflux medication the regurgitation got better. Several days ago she developed intermittent chest / epigastric pain radiating into her back. Pain not burning, she can't really be descriptive but it was unrelated to eating. No NSAID use. Evaluated in ED on 6/29,  CTA chest negative for acute abnormalities. The pain eventually localized to just under right shoulder blade. Sitting up made it worse. She hasn't had any pain in a few days. No SOB. Urinary frequently but no dysuria.     Past Medical History:  Diagnosis Date  . Anemia   . Anxiety   . Arthritis    hips, knees  . Atrial fibrillation  ([place])   . Current use of long term anticoagulation   . Diverticulitis of colon 02/06/2015  . Diverticulosis   . Gait difficulty   . GERD (gastroesophageal reflux disease)   . Headache(784.0) 06/22/2012  . Hyperlipidemia   . Hypertension   . IBS (irritable bowel syndrome)   . Incontinence   . Loss of weight 06/05/2015  . Medicare annual wellness visit, subsequent 02/05/2014  . Melena   . Mitral and aortic regurgitation   . Mitral valve prolapse    hx of  . Osteopenia   . Pedal edema 10/01/2016  . Personal history of colonic polyps   . Renal lithiasis 06/26/2015  . Thyroid disease    hypo  . Unspecified adverse effect of other drug, medicinal and biological substance(995.29)   . Vitamin B12 deficiency 03/16/2016  . Vitamin D deficiency     Past Surgical History:  Procedure Laterality Date  . BOTOX INJECTION N/A 09/03/2012   Procedure: BOTOX INJECTION;  Surgeon: Inda Castle, MD;  Location: [facility];  Service: Endoscopy;  Laterality: N/A;  . BREAST BIOPSY Left   . BREAST LUMPECTOMY Right 08/18/2014   Procedure: RIGHT BREAST LUMPECTOMY;  Surgeon: Coralie Keens, MD;  Location: [facility];  Service: General;  Laterality: Right;  . CARDIAC ELECTROPHYSIOLOGY MAPPING AND ABLATION    . CATARACT EXTRACTION, BILATERAL    . ESOPHAGOGASTRODUODENOSCOPY N/A 09/03/2012   Procedure: ESOPHAGOGASTRODUODENOSCOPY (EGD);  Surgeon: Inda Castle, MD;  Location: [facility];  Service: Endoscopy;  Laterality: N/A;  . ESOPHAGOSCOPY W/ BOTOX INJECTION    . I&D EXTREMITY Right 11/06/2012   Procedure: IRRIGATION AND DEBRIDEMENT EXTREMITY Right Ring Finger;  Surgeon: Tennis Must, MD;  Location: [facility];  Service: Orthopedics;  Laterality: Right;  . PARTIAL HYSTERECTOMY     ovaries left in place    Prior to Admission medications   Medication Sig Start Date End Date Taking? Authorizing Provider  ALPRAZolam (XANAX) 0.25 MG tablet Take 0.5-1 tablets (0.125-0.25 mg total) by mouth 2 (two)  times daily as needed for anxiety. 05/09/17  Yes Mosie Lukes, MD  Cholecalciferol (VITAMIN D) 2000 UNITS CAPS Take 2,000 Units by mouth every other day. 02/20/12  Yes Mosie Lukes, MD  Cyanocobalamin (VITAMIN B-12) 500 MCG SUBL Place 1 tablet (500 mcg total) under the tongue daily at 2 PM. 06/22/16  Yes Mosie Lukes, MD  metoprolol tartrate (LOPRESSOR) 25 MG tablet Take 1 tablet (25 mg total) by mouth 2 (two) times daily. 06/12/17  Yes Dorothy Spark, MD  pantoprazole (PROTONIX) 40 MG tablet TAKE 1 TABLET (40 MG TOTAL) BY MOUTH DAILY. 03/25/17  Yes Mosie Lukes, MD  ramipril (ALTACE) 10 MG capsule TAKE 1 CAPSULE (10 MG TOTAL) BY MOUTH DAILY. 06/11/17  Yes Mosie Lukes, MD  warfarin (COUMADIN) 2.5 MG tablet TAKE AS DIRECTED BY ANTICOAGULATION CLINIC. A 30 DAY SUPPLY 11/07/16  Yes Dorothy Spark, MD  azelastine (ASTELIN) 0.1 % nasal spray Place 2 sprays into both nostrils 2 (two) times daily. Use in each nostril as directed Patient not taking: Reported on 06/29/2017 05/09/17   Mosie Lukes, MD  furosemide (LASIX) 20 MG tablet Take 1 tablet (20 mg total) by mouth daily as needed. Patient not taking: Reported on 06/29/2017 06/12/17   Dorothy Spark, MD    Current Facility-Administered Medications  Medication Dose Route Frequency Provider Last Rate Last Dose  . acetaminophen (TYLENOL) tablet 650 mg  650 mg Oral Q6H PRN Amin, Ankit Chirag, MD       Or  . acetaminophen (TYLENOL) suppository 650 mg  650 mg Rectal Q6H PRN Amin, Ankit Chirag, MD      . cholecalciferol (VITAMIN D) tablet 2,000 Units  2,000 Units Oral QODAY Amin, Ankit Chirag, MD   2,000 Units at 06/30/17 1254  . feeding supplement (ENSURE ENLIVE) (ENSURE ENLIVE) liquid 237 mL  237 mL Oral BID BM Amin, Ankit Chirag, MD   237 mL at 07/01/17 1051  . gi cocktail (Maalox,Lidocaine,Donnatal)  30 mL Oral TID PRN Amin, Ankit Chirag, MD      . hydrALAZINE (APRESOLINE) injection 5 mg  5 mg Intravenous Q4H PRN Amin, Ankit Chirag, MD    5 mg at 06/29/17 2305  . ondansetron (ZOFRAN) tablet 4 mg  4 mg Oral Q6H PRN Amin, Ankit Chirag, MD       Or  . ondansetron (ZOFRAN) injection 4 mg  4 mg Intravenous Q6H PRN Amin, Ankit Chirag, MD      . pantoprazole (PROTONIX) EC tablet 40 mg  40 mg Oral BID AC Amin, Ankit Chirag, MD      . ramipril (ALTACE) capsule 10 mg  10 mg Oral Daily Amin, Ankit Chirag, MD   10 mg at 07/01/17 1045  . sodium chloride tablet 2 g  2 g Oral BID WC Amin, Ankit Chirag, MD   2 g at 07/01/17 1045  . tolvaptan (SAMSCA) tablet 15 mg  15 mg Oral Q24H Elmarie Shiley, MD  15 mg at 07/01/17 1400  . warfarin (COUMADIN) tablet 2.5 mg  2.5 mg Oral ONCE-1800 Wofford, Cindie Laroche, RPH      . Warfarin - Pharmacist Dosing Inpatient   Does not apply q1800 Damita Lack, MD        Allergies as of 06/29/2017 - Review Complete 06/29/2017  Allergen Reaction Noted  . Darifenacin hydrobromide    . Sulfonamide derivatives Other (See Comments) 01/29/2007  . Tramadol Other (See Comments) 11/13/2010    Family History  Problem Relation Age of Onset  . Diabetes Mother   . CVA Mother   . Stroke Mother   . COPD Father   . Rheum arthritis Father   . Allergies Daughter   . COPD Daughter   . Stroke Daughter   . COPD Daughter        previous smoker  . Stroke Maternal Grandfather   . Heart disease Maternal Aunt   . Heart disease Maternal Uncle   . Colon cancer Neg Hx   . Colon polyps Neg Hx   . Esophageal cancer Neg Hx   . Gallbladder disease Neg Hx   . Kidney disease Neg Hx   . Heart attack Neg Hx   . Hypertension Neg Hx     Social History   Socioeconomic History  . Marital status: Widowed    Spouse name: Not on file  . Number of children: 5  . Years of education: Not on file  . Highest education level: Not on file  Occupational History  . Occupation: retired    Fish farm manager: RETIRED  Social Needs  . Financial resource strain: Not on file  . Food insecurity:    Worry: Not on file    Inability: Not on file  .  Transportation needs:    Medical: Not on file    Non-medical: Not on file  Tobacco Use  . Smoking status: Never Smoker  . Smokeless tobacco: Never Used  Substance and Sexual Activity  . Alcohol use: No  . Drug use: No  . Sexual activity: Not on file    Comment: lives with Daughter, grandson and his family. no dietary restricitons.  Lifestyle  . Physical activity:    Days per week: Not on file    Minutes per session: Not on file  . Stress: Not on file  Relationships  . Social connections:    Talks on phone: Not on file    Gets together: Not on file    Attends religious service: Not on file    Active member of club or organization: Not on file    Attends meetings of clubs or organizations: Not on file    Relationship status: Not on file  . Intimate partner violence:    Fear of current or ex partner: Not on file    Emotionally abused: Not on file    Physically abused: Not on file    Forced sexual activity: Not on file  Other Topics Concern  . Not on file  Social History Narrative  . Not on file    Review of Systems: All systems reviewed and negative except where noted in HPI.  Physical Exam: Vital signs in last 24 hours: Temp:  [98.9 F (37.2 C)-99.9 F (37.7 C)] 99.9 F (37.7 C) (07/01 0548) Pulse Rate:  [71-116] 116 (07/01 0548) Resp:  [16-18] 16 (07/01 0548) BP: (135-142)/(59-76) 142/76 (07/01 0548) SpO2:  [96 %] 96 % (07/01 0548) Last BM Date: 06/29/17 General:   Alert, frail female  in NAD Psych:  Pleasant, cooperative. Normal mood and affect. Eyes:  Pupils equal, sclera clear, no icterus.   Conjunctiva pink. Ears:  Normal auditory acuity. Nose:  No deformity, discharge,  or lesions. Neck:  Supple; no masses Lungs:  Clear throughout to auscultation.   No wheezes, crackles, or rhonchi.  Heart:  Regular rate, occas skipped beat Abdomen:  Soft, non-distended, nontender, BS active, no palp mass    Rectal:  Deferred  Msk:  Symmetrical without gross deformities.  . Neurologic:  Alert and  oriented x4;  grossly normal neurologically. Skin:  Intact without significant lesions or rashes..   Intake/Output from previous day: 06/30 0701 - 07/01 0700 In: 260 [P.O.:260] Out: -  Intake/Output this shift: Total I/O In: 600 [P.O.:600] Out: -   Lab Results: Recent Labs    06/29/17 1500 07/01/17 0804  WBC 7.4 8.5  HGB 11.9* 11.9*  HCT 33.5* 34.7*  PLT 223 228   BMET Recent Labs    06/30/17 0425 07/01/17 0804 07/01/17 1314  NA 128* 127* 130*  K 4.6 4.5 4.1  CL 94* 90* 94*  CO2 24 29 26   GLUCOSE 100* 120* 154*  BUN 11 15 18   CREATININE 0.89 0.96 1.12*  CALCIUM 8.5* 9.0 8.9   LFT Recent Labs    06/29/17 1500  PROT 6.4*  ALBUMIN 3.1*  AST 17  ALT 9  ALKPHOS 17*  BILITOT 0.6  BILIDIR 0.1  IBILI 0.5   PT/INR Recent Labs    06/29/17 1500 07/01/17 0904  LABPROT 21.7* 22.7*  INR 1.91 2.02     Studies/Results: No results found.   [name] Savoy, NP-C @  07/01/2017, 2:00 PM   ________________________________________________________________________  [name] GI MD note:  I personally examined the patient, reviewed the data and agree with the assessment and plan described above.  Atypical chest pains, none in 3-4 days however.  Somewhat biliary sounding, will check abdominal US as this is more sensitive than CT for detecting cholelithiasis. If negative she is safe to go home, PRN GI follow up. Will follow along.  thanks   Owens Loffler, MD [facility] Pager [phone]

## 2017-07-01 NOTE — Progress Notes (Signed)
Patient ID: [name] [name], female   DOB: [birth year], 84 y.o.   MRN: [id number]  [facility] Progress Note   Assessment/ Plan:   1. Hyponatremia: Lab data/history pointing to a combination of SIADH and decreased solute intake leading to hyponatremia-has been on fluid restriction and salt tablets supplementation overnight however, sodium levels not significantly better and she continues to have nausea/poor appetite and further compounds hyponatremia.  She appears to have choked on her salt tablets earlier this morning and regurgitated them.  We will plan on using short-term tolvaptan to try and improve rate of sodium correction prior to her discharge.  Neurologically, she is asymptomatic and without indications for hypertonic saline.  Correction of hyponatremia is further compounded by ongoing ACE inhibitor use. 2.  Paroxysmal atrial fibrillation: initially bradycardic heart rate now intermittently tachycardic monitor for triggers to restart beta-blocker. 3.  Gastroesophageal reflux disease: Poor appetite and intermittent nausea that further compound hyponatremia by non-osmotic induced ADH production.  Continue symptom management. 4.  Hypertension: Blood pressures mildly elevated, resume low-dose beta-blocker.  Subjective:   Reports to be feeling fair-had an episode of choking on medications earlier today with regurgitation.   Objective:   BP (!) 142/76 (BP Location: Right Arm)   Pulse (!) 116   Temp 99.9 F (37.7 C) (Oral)   Resp 16   Ht 4\' 11"  (1.499 m)   Wt 42.4 kg (93 lb 7.6 oz)   SpO2 96%   BMI 18.88 kg/m   Intake/Output Summary (Last 24 hours) at 07/01/2017 1233 Last data filed at 07/01/2017 7824 Gross per 24 hour  Intake 620 ml  Output -  Net 620 ml   Weight change:   Physical Exam: Gen: Sitting up in bed, playing word search CVS: Regular tachycardia, S1 and S2 normal Resp: Clear to auscultation, no rales/rhonchi Abd: Soft, flat, nontender Ext: No lower  extremity edema  Imaging: No results found.  Labs: BMET Recent Labs  Lab 06/27/17 1543 06/29/17 1542 06/30/17 0425 07/01/17 0804  NA 124* 123* 128* 127*  K 5.1 4.2 4.6 4.5  CL 89* 91* 94* 90*  CO2 28 24 24 29   GLUCOSE 101* 137* 100* 120*  BUN 13 12 11 15   CREATININE 0.89 0.77 0.89 0.96  CALCIUM 8.4 7.4* 8.5* 9.0   CBC Recent Labs  Lab 06/29/17 1500 07/01/17 0804  WBC 7.4 8.5  HGB 11.9* 11.9*  HCT 33.5* 34.7*  MCV 84.8 86.3  PLT 223 228   Medications:    . cholecalciferol  2,000 Units Oral QODAY  . feeding supplement (ENSURE ENLIVE)  237 mL Oral BID BM  . pantoprazole  40 mg Oral BID AC  . ramipril  10 mg Oral Daily  . sodium chloride  2 g Oral BID WC  . warfarin  2.5 mg Oral ONCE-1800  . Warfarin - Pharmacist Dosing Inpatient   Does not apply [id number]   Elmarie Shiley, MD 07/01/2017, 12:33 PM

## 2017-07-02 ENCOUNTER — Telehealth: Payer: Self-pay | Admitting: Family Medicine

## 2017-07-02 DIAGNOSIS — I481 Persistent atrial fibrillation: Secondary | ICD-10-CM

## 2017-07-02 LAB — PROTIME-INR
INR: 2.19
Prothrombin Time: 24.2 seconds — ABNORMAL HIGH (ref 11.4–15.2)

## 2017-07-02 LAB — CBC
HEMATOCRIT: 34.7 % — AB (ref 36.0–46.0)
Hemoglobin: 11.6 g/dL — ABNORMAL LOW (ref 12.0–15.0)
MCH: 29.4 pg (ref 26.0–34.0)
MCHC: 33.4 g/dL (ref 30.0–36.0)
MCV: 87.8 fL (ref 78.0–100.0)
PLATELETS: 227 10*3/uL (ref 150–400)
RBC: 3.95 MIL/uL (ref 3.87–5.11)
RDW: 15 % (ref 11.5–15.5)
WBC: 6.8 10*3/uL (ref 4.0–10.5)

## 2017-07-02 LAB — BASIC METABOLIC PANEL
Anion gap: 9 (ref 5–15)
BUN: 20 mg/dL (ref 8–23)
CHLORIDE: 101 mmol/L (ref 98–111)
CO2: 29 mmol/L (ref 22–32)
Calcium: 9.3 mg/dL (ref 8.9–10.3)
Creatinine, Ser: 1.13 mg/dL — ABNORMAL HIGH (ref 0.44–1.00)
GFR calc Af Amer: 48 mL/min — ABNORMAL LOW (ref >60)
GFR calc non Af Amer: 41 mL/min — ABNORMAL LOW (ref >60)
GLUCOSE: 103 mg/dL — AB (ref 70–99)
Potassium: 4.2 mmol/L (ref 3.5–5.1)
SODIUM: 139 mmol/L (ref 135–145)

## 2017-07-02 LAB — MAGNESIUM: Magnesium: 2 mg/dL (ref 1.7–2.4)

## 2017-07-02 MED ORDER — METOPROLOL SUCCINATE ER 25 MG PO TB24: 12.5000 mg | ORAL_TABLET | Freq: Every day | ORAL | 0 refills | Status: DC

## 2017-07-02 MED ORDER — WARFARIN SODIUM 2.5 MG PO TABS
2.5000 mg | ORAL_TABLET | Freq: Once | ORAL | Status: AC
  Administered 2017-07-02: 2.5 mg via ORAL
  Filled 2017-07-02: qty 1

## 2017-07-02 NOTE — Discharge Summary (Signed)
Physician Discharge Summary  [name] [name] HAL:[id number] DOB: [birth year] DOA: 06/29/2017  PCP: Mosie Lukes, MD  Admit date: 06/29/2017 Discharge date: 07/02/2017  Admitted From: Home  Disposition:  Home   Recommendations for Outpatient Follow-up:  1. Follow up with PCP in 1 weeks 2. Please obtain BMP/CBC in one week your next doctors visit.  3. Follow up with outpatient cardiology in 2-4 weeks  4. Follow up with outpatient GI needed  5. Metoprolol has been changed to Toprol by Cardiology.   Discharge Condition: Stable CODE STATUS:  Full  Diet recommendation: Cardiac  Brief/Interim Summary:  84 year old with a history of A. fib on Coumadin, essential hypertension was brought to the hospital for generalized weakness and decreased appetite.  She was also noted to be slightly bradycardic and hyponatremic therefore admitted to the hospital.  Patient was treated for SIADH by fluid restriction without much improvement and with without much improvement.  Cardiologynephro rec- she was given Tolvaptan which improved her Sodium up to 140.  Cardiology was consulted for bradycardia and her Metoprolol was stopped but due to elevated HR afterwards, she was started on Toprol XL 12.5mg  daily.  She had also been complaining of atypical chest pain and slight dysphagia therefore gastroneurology was consulted.  Previously patient CTA of the chest has been negative therefore ultrasound of the abdomen was done which was negative for any gallstones.  Patient was cleared by gastroenterology as she did not have any and these complaints anymore during the hospital stay and recommended outpatient follow-up as needed.  At this point she has reached maximum benefit from hospital stay and stable to be discharged with outpatient follow-up recommendations as stated above.    Discharge Diagnoses:  Principal Problem:   Hyponatremia Active Problems:   Essential hypertension   PAROXYSMAL ATRIAL FIBRILLATION    Chronic anticoagulation   Bradycardia   Hyponatremia; improved -  Initially she was placed on fluid restriction and was even offered salt tablets but patient was not able to tolerate that.  Her sodium did not improve as much as expected therefore nephrology recommended given patient tolvaptan which improved her sodium 239.  At this time patient is at baseline and stable to be discharged with outpatient follow-up recommendations as stated above.  She will need outpatient lab work check in 1 week with her PCP office when she follows up to ensure that this is remained stable.  Dysphagia, resolved Atypical chest pain, likely GERD, resolved - Previously patient has suffered from achalasia and has had endoscopy in 2014.  Previously treated with Botox injection.    GI was consulted recommending getting right upper quadrant ultrasound which was negative for any cholecystitis or gallstone.  Recommended outpatient follow-up as needed.  History of paroxysmal atrial fibrillation Bradycardia, asymptomatic, resolved -Initially her beta-blocker was stopped whic started improving her heart rate but then after metoprolol washout her heart rate started jumping up greater than 100 therefore cardiology recommended placing the patient on Toprol-XL 12.5 mg orally daily.  Which she tolerated well.  Continue home dose of Coumadin.  Essential hypertension -Resume home medications  Mild to moderate protein calorie malnutrition -Dietitian consult.  Encourage oral diet  She is on Coumadin at home Full code Discharge today when seen by nephrology.   Discharge Instructions   Allergies as of 07/02/2017      Reactions   Darifenacin Hydrobromide    REACTION: causes her dizziness   Sulfonamide Derivatives Other (See Comments)   Pt thinks it caused dizziness  Tramadol Other (See Comments)   Insomnia, anorexia      Medication List    STOP taking these medications   metoprolol tartrate 25 MG tablet Commonly  known as:  LOPRESSOR     TAKE these medications   ALPRAZolam 0.25 MG tablet Commonly known as:  XANAX Take 0.5-1 tablets (0.125-0.25 mg total) by mouth 2 (two) times daily as needed for anxiety.   azelastine 0.1 % nasal spray Commonly known as:  ASTELIN Place 2 sprays into both nostrils 2 (two) times daily. Use in each nostril as directed   furosemide 20 MG tablet Commonly known as:  LASIX Take 1 tablet (20 mg total) by mouth daily as needed.   metoprolol succinate 25 MG 24 hr tablet Commonly known as:  TOPROL-XL Take 0.5 tablets (12.5 mg total) by mouth daily. Start taking on:  07/03/2017   pantoprazole 40 MG tablet Commonly known as:  PROTONIX TAKE 1 TABLET (40 MG TOTAL) BY MOUTH DAILY.   ramipril 10 MG capsule Commonly known as:  ALTACE TAKE 1 CAPSULE (10 MG TOTAL) BY MOUTH DAILY.   Vitamin B-12 500 MCG Subl Place 1 tablet (500 mcg total) under the tongue daily at 2 PM.   Vitamin D 2000 units Caps Take 2,000 Units by mouth every other day.   warfarin 2.5 MG tablet Commonly known as:  COUMADIN Take as directed. If you are unsure how to take this medication, talk to your nurse or doctor. Original instructions:  TAKE AS DIRECTED BY ANTICOAGULATION CLINIC. A [place]    Mosie Lukes, MD. Schedule an appointment as soon as possible for a visit in 1 week(s).   Specialty:  Family Medicine Contact information: [address] [place] [address] [phone]        Dorothy Spark, MD .   Specialty:  Cardiology Contact information: [place] [address] [phone]          Allergies  Allergen Reactions  . Darifenacin Hydrobromide     REACTION: causes her dizziness  . Sulfonamide Derivatives Other (See Comments)    Pt thinks it caused dizziness  . Tramadol Other (See Comments)    Insomnia, anorexia    You were cared for by a hospitalist during your hospital stay. If  you have any questions about your discharge medications or the care you received while you were in the hospital after you are discharged, you can call the unit and asked to speak with the hospitalist on call if the hospitalist that took care of you is not available. Once you are discharged, your primary care physician will handle any further medical issues. Please note that no refills for any discharge medications will be authorized once you are discharged, as it is imperative that you return to your primary care physician (or establish a relationship with a primary care physician if you do not have one) for your aftercare needs so that they can reassess your need for medications and monitor your lab values.  Consultations:  Nephrology  Gastroenterology  Cardiology    Procedures/Studies: US Abdomen Complete  Result Date: 07/01/2017 CLINICAL DATA:  84 year old female with acute abdominal pain for several days. EXAM: ABDOMEN ULTRASOUND COMPLETE COMPARISON:  06/09/2015 CT and priors FINDINGS: Gallbladder: The gallbladder is contracted. There is no evidence of cholelithiasis or acute cholecystitis. Common bile duct: Diameter: 6 mm. No intrahepatic or extrahepatic biliary dilatation. Liver: No focal  lesion identified. Within normal limits in parenchymal echogenicity. Portal vein is patent on color Doppler imaging with normal direction of blood flow towards the liver. IVC: No abnormality visualized. Pancreas: Visualized portion unremarkable. Spleen: Size and appearance within normal limits. Right Kidney: Length: 8.1 cm. Echogenicity within normal limits. No mass or hydronephrosis visualized. Left Kidney: Length: 8 cm fullness of the LEFT intrarenal collecting system is unchanged from recent CT. A 3 mm nonobstructing calculus within the UPPER LEFT kidney. Echogenicity within normal limits. No mass visualized. Abdominal aorta: No aneurysm visualized. Other findings: None. IMPRESSION: 1. No acute abnormality.  2. Unchanged fullness of the LEFT intrarenal collecting system and 3 mm nonobstructing LEFT renal calculus Electronically Signed   By: Margarette Canada M.D.   On: 07/01/2017 19:47   Ct Angio Chest/abd/pel For Dissection W And/or Wo Contrast  Result Date: 06/20/2017 CLINICAL DATA:  Chest pain for 4 days with epigastric pain EXAM: CT ANGIOGRAPHY CHEST, ABDOMEN AND PELVIS TECHNIQUE: Multidetector CT imaging through the chest, abdomen and pelvis was performed using the standard protocol during bolus administration of intravenous contrast. Multiplanar reconstructed images and MIPs were obtained and reviewed to evaluate the vascular anatomy. CONTRAST:  21mL ISOVUE-370 IOPAMIDOL (ISOVUE-370) INJECTION 76% COMPARISON:  Chest x-ray 12/10/2016, CT abdomen pelvis 06/09/2015, 03/05/2015 FINDINGS: CTA CHEST FINDINGS Cardiovascular: Non contrasted images of the chest demonstrate no intramural hematoma. Moderate-to-marked aortic atherosclerosis. Coronary vascular calcification. Heart size upper limits of normal. No significant pericardial effusion. Negative for aortic dissection or aneurysm. Common origin of the left common carotid and brachiocephalic arteries. Direct origin of the left vertebral artery from the arch. Mediastinum/Nodes: Midline trachea. No thyroid mass. No significantly enlarged lymph nodes. Moderate hiatal hernia. Lungs/Pleura: No acute opacity or pleural effusion. Calcified granuloma. No pneumothorax. Musculoskeletal: Kyphosis of the spine with degenerative changes. No suspicious bone lesions. Review of the MIP images confirms the above findings. CTA ABDOMEN AND PELVIS FINDINGS VASCULAR Aorta: Negative for aneurysm or dissection. Moderate aortic atherosclerosis. Celiac: Mild calcific disease at the origin without significant stenosis. SMA: Calcific disease at the origin without significant stenosis. Distal vessel patency Renals: Single right and single left renal arteries. Mild to moderate calcification at the  origin of the right renal artery without high-grade stenosis. Moderate calcific disease at the left renal artery origin with mild to moderate suspected stenosis. IMA: Patent but limited evaluation of origin due to calcific disease Inflow: Moderate aortic atherosclerosis. No occlusive disease or significant stenosis. Veins: No obvious venous abnormality within the limitations of this arterial phase study. Review of the MIP images confirms the above findings. NON-VASCULAR Hepatobiliary: No focal liver abnormality is seen. No gallstones, gallbladder wall thickening, or biliary dilatation. Pancreas: Unremarkable. No pancreatic ductal dilatation or surrounding inflammatory changes. Spleen: Stable hypodense lesion in the mid spleen. Possible hyperenhancing mass measuring 16 mm posteriorly. Adrenals/Urinary Tract: Adrenal glands are normal. No hydronephrosis. Subcentimeter cortical hypodense lesions too small to further characterize but likely benign. Bladder is normal Stomach/Bowel: Stomach is within normal limits. Appendix not well seen but no right lower quadrant inflammatory process. Sigmoid colon diverticular disease without acute inflammation no evidence of bowel wall thickening, distention, or inflammatory changes. Lymphatic: No significantly enlarged lymph nodes. Reproductive: Status post hysterectomy.  No adnexal mass Other: Negative for free air or free fluid Musculoskeletal: Non suspicious. Review of the MIP images confirms the above findings. IMPRESSION: 1. Negative for aortic aneurysm or acute aortic dissection. 2. Atherosclerotic vascular disease with suspected renal artery stenosis. 3. No CT evidence for acute intra-abdominal or  pelvic abnormality. 4. Possible 16 mm hyperenhancing splenic mass. When the patient is clinically stable and able to follow directions and hold their breath (preferably as an outpatient) further evaluation with dedicated abdominal MRI should be considered. 5. Sigmoid colon  diverticular disease without acute inflammation Electronically Signed   By: Donavan Foil M.D.   On: 06/20/2017 20:57      Subjective: No complaints at all this morning, no acute events overnight.  She is feeling better  General = no fevers, chills, dizziness, malaise, fatigue HEENT/EYES = negative for pain, redness, loss of vision, double vision, blurred vision, loss of hearing, sore throat, hoarseness, dysphagia Cardiovascular= negative for chest pain, palpitation, murmurs, lower extremity swelling Respiratory/lungs= negative for shortness of breath, cough, hemoptysis, wheezing, mucus production Gastrointestinal= negative for nausea, vomiting,, abdominal pain, melena, hematemesis Genitourinary= negative for Dysuria, Hematuria, Change in Urinary Frequency MSK = Negative for arthralgia, myalgias, Back Pain, Joint swelling  Neurology= Negative for headache, seizures, numbness, tingling  Psychiatry= Negative for anxiety, depression, suicidal and homocidal ideation Allergy/Immunology= Medication/Food allergy as listed  Skin= Negative for Rash, lesions, ulcers, itching   Discharge Exam: Vitals:   07/02/17 0423 07/02/17 0913  BP: (!) 156/79 (!) 152/83  Pulse: (!) 108 87  Resp: 18   Temp: 98.1 F (36.7 C)   SpO2: 95%    Vitals:   07/01/17 1517 07/01/17 2036 07/02/17 0423 07/02/17 0913  BP: 126/62 (!) 126/55 (!) 156/79 (!) 152/83  Pulse: 76 71 (!) 108 87  Resp: 18 18 18    Temp:  98 F (36.7 C) 98.1 F (36.7 C)   TempSrc:  Oral Oral   SpO2: 98% 95% 95%   Weight:      Height:        General: Pt is alert, awake, not in acute distress; elderly frail Cardiovascular: RRR, S1/S2 +, no rubs, no gallops Respiratory: CTA bilaterally, no wheezing, no rhonchi Abdominal: Soft, NT, ND, bowel sounds + Extremities: no edema, no cyanosis    The results of significant diagnostics from this hospitalization (including imaging, microbiology, ancillary and laboratory) are listed below for  reference.     Microbiology: No results found for this or any previous visit (from the past 240 hour(s)).   Labs: BNP (last 3 results) No results for input(s): BNP in the last 8760 hours. Basic Metabolic Panel: Recent Labs  Lab 06/29/17 1542 06/30/17 0425 07/01/17 0804 07/01/17 1314 07/01/17 2033 07/02/17 0434  NA 123* 128* 127* 130* 136 139  K 4.2 4.6 4.5 4.1  --  4.2  CL 91* 94* 90* 94*  --  101  CO2 24 24 29 26   --  29  GLUCOSE 137* 100* 120* 154*  --  103*  BUN 12 11 15 18   --  20  CREATININE 0.77 0.89 0.96 1.12*  --  1.13*  CALCIUM 7.4* 8.5* 9.0 8.9  --  9.3  MG 1.9  --  2.1  --   --  2.0   Liver Function Tests: Recent Labs  Lab 06/27/17 1543 06/29/17 1500  AST 14 17  ALT 6 9  ALKPHOS 15* 17*  BILITOT 0.4 0.6  PROT 6.7 6.4*  ALBUMIN 3.8 3.1*   No results for input(s): LIPASE, AMYLASE in the last 168 hours. No results for input(s): AMMONIA in the last 168 hours. CBC: Recent Labs  Lab 06/29/17 1500 07/01/17 0804 07/02/17 0434  WBC 7.4 8.5 6.8  HGB 11.9* 11.9* 11.6*  HCT 33.5* 34.7* 34.7*  MCV 84.8 86.3 87.8  PLT 223 228 227   Cardiac Enzymes: Recent Labs  Lab 06/29/17 1500 06/29/17 2057  CKTOTAL 27*  --   TROPONINI  --  <0.03   BNP: Invalid input(s): POCBNP CBG: No results for input(s): GLUCAP in the last 168 hours. D-Dimer No results for input(s): DDIMER in the last 72 hours. Hgb A1c No results for input(s): HGBA1C in the last 72 hours. Lipid Profile No results for input(s): CHOL, HDL, LDLCALC, TRIG, CHOLHDL, LDLDIRECT in the last 72 hours. Thyroid function studies Recent Labs    07/01/17 1049  TSH 2.911   Anemia work up No results for input(s): VITAMINB12, FOLATE, FERRITIN, TIBC, IRON, RETICCTPCT in the last 72 hours. Urinalysis    Component Value Date/Time   COLORURINE YELLOW 06/29/2017 1621   APPEARANCEUR CLOUDY (A) 06/29/2017 1621   LABSPEC 1.010 06/29/2017 1621   PHURINE 7.5 06/29/2017 1621   GLUCOSEU NEGATIVE 06/29/2017  1621   GLUCOSEU NEGATIVE 06/20/2015 1617   HGBUR NEGATIVE 06/29/2017 1621   HGBUR trace-intact 01/19/2009 0929   BILIRUBINUR NEGATIVE 06/29/2017 1621   BILIRUBINUR negative 02/01/2017 1039   KETONESUR NEGATIVE 06/29/2017 1621   PROTEINUR NEGATIVE 06/29/2017 1621   UROBILINOGEN 0.2 02/01/2017 1039   UROBILINOGEN 0.2 06/20/2015 1617   NITRITE NEGATIVE 06/29/2017 1621   LEUKOCYTESUR NEGATIVE 06/29/2017 1621   Sepsis Labs Invalid input(s): PROCALCITONIN,  WBC,  LACTICIDVEN Microbiology No results found for this or any previous visit (from the past 240 hour(s)).   Time coordinating discharge:  I have spent 35 minutes face to face with the patient and on the ward discussing the patients care, assessment, plan and disposition with other care givers. >50% of the time was devoted counseling the patient about the risks and benefits of treatment/Discharge disposition and coordinating care.   SIGNED:   Damita Lack, MD  [facility] 07/02/2017, 11:32 AM Pager   If 7PM-7AM, please contact night-coverage [email or web address] Password [email or web address]

## 2017-07-02 NOTE — Care Management Important Message (Signed)
Important Message  Patient Details  Name: [name] [name] MRN: [id number] Date of Birth: [birth year]   Medicare Important Message Given:  Yes    Kerin Salen 07/02/2017, 1:31 PMImportant Message  Patient Details  Name: [name] [name] MRN: [id number] Date of Birth: 1924/01/07   Medicare Important Message Given:  Yes    Kerin Salen 07/02/2017, 1:31 PM

## 2017-07-02 NOTE — Progress Notes (Signed)
[name]   Chief Complaint:  Chest / epigastric pain    SUBJECTIVE:   Still hasn't had recurrence of abdominal / chest pain. Eating fine.   ASSESSMENT AND PLAN:   Intermittent chest / upper abdominal pain radiating through to back and eventually settling into area under right shoulder blade. Pain resolved a few days ago. Liver chemistries, lipase ok. No gallbladder findings on U/S, there is fullness of the left intrarenal collecting system and some small non-obstructing stones but no other findings.  -no pain in last few days. She is stable for discharge from GI standpoint. She will call us if recurrent pain or if dysphagia gets worse.     OBJECTIVE:     Vital signs in last 24 hours: Temp:  [98 F (36.7 C)-98.7 F (37.1 C)] 98.1 F (36.7 C) (07/02 0423) Pulse Rate:  [71-108] 87 (07/02 0913) Resp:  [18] 18 (07/02 0423) BP: (126-156)/(55-83) 152/83 (07/02 0913) SpO2:  [95 %-98 %] 95 % (07/02 0423) Last BM Date: 07/01/17 General:   Frail female in NAD EENT:  Normal hearing, non icteric sclera, conjunctive pink.  Heart:  Regular rate and rhythm, no lower extremity edema Pulm: Normal respiratory effort, lungs CTA bilaterally without wheezes or crackles. Abdomen:  Soft, nondistended, nontender.  Normal bowel sounds, no masses  Neurologic:  Alert and  oriented x4;  grossly normal neurologically. Psych:  Pleasant, cooperative.  Normal mood and affect.   Intake/Output from previous day: 07/01 0701 - 07/02 0700 In: 720 [P.O.:720] Out: 200 [Urine:200] Intake/Output this shift: No intake/output data recorded.  Lab Results: Recent Labs    06/29/17 1500 07/01/17 0804 07/02/17 0434  WBC 7.4 8.5 6.8  HGB 11.9* 11.9* 11.6*  HCT 33.5* 34.7* 34.7*  PLT 223 228 227   BMET Recent Labs    07/01/17 0804 07/01/17 1314 07/01/17 2033 07/02/17 0434  NA 127* 130* 136 139  K 4.5 4.1  --  4.2  CL 90* 94*  --  101  CO2 29 26  --  29  GLUCOSE  120* 154*  --  103*  BUN 15 18  --  20  CREATININE 0.96 1.12*  --  1.13*  CALCIUM 9.0 8.9  --  9.3   LFT Recent Labs    06/29/17 1500  PROT 6.4*  ALBUMIN 3.1*  AST 17  ALT 9  ALKPHOS 17*  BILITOT 0.6  BILIDIR 0.1  IBILI 0.5   PT/INR Recent Labs    07/01/17 0904 07/02/17 0434  LABPROT 22.7* 24.2*  INR 2.02 2.19     US Abdomen Complete  Result Date: 07/01/2017 CLINICAL DATA:  84 year old female with acute abdominal pain for several days. EXAM: ABDOMEN ULTRASOUND COMPLETE COMPARISON:  06/09/2015 CT and priors FINDINGS: Gallbladder: The gallbladder is contracted. There is no evidence of cholelithiasis or acute cholecystitis. Common bile duct: Diameter: 6 mm. No intrahepatic or extrahepatic biliary dilatation. Liver: No focal lesion identified. Within normal limits in parenchymal echogenicity. Portal vein is patent on color Doppler imaging with normal direction of blood flow towards the liver. IVC: No abnormality visualized. Pancreas: Visualized portion unremarkable. Spleen: Size and appearance within normal limits. Right Kidney: Length: 8.1 cm. Echogenicity within normal limits. No mass or hydronephrosis visualized. Left Kidney: Length: 8 cm fullness of the LEFT intrarenal collecting system is unchanged from recent CT. A 3 mm nonobstructing calculus within the UPPER LEFT kidney. Echogenicity within normal limits. No mass visualized. Abdominal aorta: No aneurysm visualized. Other findings: None.  IMPRESSION: 1. No acute abnormality. 2. Unchanged fullness of the LEFT intrarenal collecting system and 3 mm nonobstructing LEFT renal calculus Electronically Signed   By: Margarette Canada M.D.   On: 07/01/2017 19:47    Principal Problem:   Hyponatremia Active Problems:   Essential hypertension   PAROXYSMAL ATRIAL FIBRILLATION   Chronic anticoagulation   Bradycardia    LOS: 3 days   Tye Savoy ,NP 07/02/2017, 9:59 AM

## 2017-07-02 NOTE — Telephone Encounter (Signed)
Tried calling pt received no answer left detailed message for pt or pt daughter to return phone call as soon as possible

## 2017-07-02 NOTE — Telephone Encounter (Signed)
Attempted pt and daughter (at her two listed numbers).  Unable to reach patient or daughter.  Could you please call again in AM?

## 2017-07-02 NOTE — Progress Notes (Signed)
Patient ID: [name] [name], female   DOB: 17-May-1924, 84 y.o.   MRN: [id number]  [facility] Progress Note   Assessment/ Plan:   1. Hyponatremia: Lab data/history pointing to a combination of SIADH and decreased solute intake leading to hyponatremia-has had significant improvement of her sodium level with tolvaptan overnight prompting to its discontinuation.  Would recommend discharging home without salt tablets but with the recommendations for increased caloric/food intake and with 1.5 L fluid restriction. 2.  Paroxysmal atrial fibrillation: Currently rate controlled and in sinus rhythm, continue to monitor on low-dose beta-blocker. 3.  Gastroesophageal reflux disease: Improving appetite noted with good control of GERD/nausea symptoms. 4.  Hypertension: Blood pressures mildly elevated, monitor on current therapy with beta-blocker/ramipril.  Subjective:   Reports to be feeling much better this morning-looking forward to going home later this afternoon.   Objective:   BP (!) 152/83 (BP Location: Right Arm)   Pulse 87   Temp 98.1 F (36.7 C) (Oral)   Resp 18   Ht 4\' 11"  (1.499 m)   Wt 42.4 kg (93 lb 7.6 oz)   SpO2 95%   BMI 18.88 kg/m   Intake/Output Summary (Last 24 hours) at 07/02/2017 1233 Last data filed at 07/02/2017 1212 Gross per 24 hour  Intake 720 ml  Output 200 ml  Net 520 ml   Weight change:   Physical Exam: Gen: Sitting up in bed, eating lunch CVS: Regular rhythm, normal rate, S1 and S2 normal Resp: Clear to auscultation, no rales/rhonchi Abd: Soft, flat, nontender Ext: No lower extremity edema  Imaging: US Abdomen Complete  Result Date: 07/01/2017 CLINICAL DATA:  84 year old female with acute abdominal pain for several days. EXAM: ABDOMEN ULTRASOUND COMPLETE COMPARISON:  06/09/2015 CT and priors FINDINGS: Gallbladder: The gallbladder is contracted. There is no evidence of cholelithiasis or acute cholecystitis. Common bile duct: Diameter: 6 mm. No  intrahepatic or extrahepatic biliary dilatation. Liver: No focal lesion identified. Within normal limits in parenchymal echogenicity. Portal vein is patent on color Doppler imaging with normal direction of blood flow towards the liver. IVC: No abnormality visualized. Pancreas: Visualized portion unremarkable. Spleen: Size and appearance within normal limits. Right Kidney: Length: 8.1 cm. Echogenicity within normal limits. No mass or hydronephrosis visualized. Left Kidney: Length: 8 cm fullness of the LEFT intrarenal collecting system is unchanged from recent CT. A 3 mm nonobstructing calculus within the UPPER LEFT kidney. Echogenicity within normal limits. No mass visualized. Abdominal aorta: No aneurysm visualized. Other findings: None. IMPRESSION: 1. No acute abnormality. 2. Unchanged fullness of the LEFT intrarenal collecting system and 3 mm nonobstructing LEFT renal calculus Electronically Signed   By: Margarette Canada M.D.   On: 07/01/2017 19:47    Labs: BMET Recent Labs  Lab 06/27/17 1543 06/29/17 1542 06/30/17 0425 07/01/17 0804 07/01/17 1314 07/01/17 2033 07/02/17 0434  NA 124* 123* 128* 127* 130* 136 139  K 5.1 4.2 4.6 4.5 4.1  --  4.2  CL 89* 91* 94* 90* 94*  --  101  CO2 28 24 24 29 26   --  29  GLUCOSE 101* 137* 100* 120* 154*  --  103*  BUN 13 12 11 15 18   --  20  CREATININE 0.89 0.77 0.89 0.96 1.12*  --  1.13*  CALCIUM 8.4 7.4* 8.5* 9.0 8.9  --  9.3   CBC Recent Labs  Lab 06/29/17 1500 07/01/17 0804 07/02/17 0434  WBC 7.4 8.5 6.8  HGB 11.9* 11.9* 11.6*  HCT 33.5* 34.7* 34.7*  MCV 84.8 86.3 87.8  PLT 223 228 227   Medications:    . cholecalciferol  2,000 Units Oral QODAY  . feeding supplement (ENSURE ENLIVE)  237 mL Oral BID BM  . metoprolol succinate  12.5 mg Oral Daily  . pantoprazole  40 mg Oral BID AC  . ramipril  10 mg Oral Daily  . Warfarin - Pharmacist Dosing Inpatient   Does not apply V1427   Elmarie Shiley, MD 07/02/2017, 12:33 PM

## 2017-07-02 NOTE — Progress Notes (Signed)
Progress Note  Patient Name: [name] [name] Date of Encounter: 07/02/2017  Primary Cardiologist: Ena Dawley, MD   Subjective   Smiling, feeling well, no chest pain, no shortness of breath.  Inpatient Medications    Scheduled Meds: . cholecalciferol  2,000 Units Oral QODAY  . feeding supplement (ENSURE ENLIVE)  237 mL Oral BID BM  . metoprolol succinate  12.5 mg Oral Daily  . pantoprazole  40 mg Oral BID AC  . ramipril  10 mg Oral Daily  . sodium chloride  2 g Oral BID WC  . Warfarin - Pharmacist Dosing Inpatient   Does not apply q1800   Continuous Infusions:  PRN Meds: acetaminophen **OR** acetaminophen, gi cocktail, hydrALAZINE, ondansetron **OR** ondansetron (ZOFRAN) IV   Vital Signs    Vitals:   07/01/17 1517 07/01/17 2036 07/02/17 0423 07/02/17 0913  BP: 126/62 (!) 126/55 (!) 156/79 (!) 152/83  Pulse: 76 71 (!) 108 87  Resp: 18 18 18    Temp:  98 F (36.7 C) 98.1 F (36.7 C)   TempSrc:  Oral Oral   SpO2: 98% 95% 95%   Weight:      Height:        Intake/Output Summary (Last 24 hours) at 07/02/2017 1212 Last data filed at 07/02/2017 1000 Gross per 24 hour  Intake 480 ml  Output 200 ml  Net 280 ml   Filed Weights   06/29/17 1642 06/29/17 2219  Weight: 93 lb (42.2 kg) 93 lb 7.6 oz (42.4 kg)    Telemetry    Heart rate currently in the 70s atrial fibrillation, brief transient 40s at night when sleeping.- Personally Reviewed  ECG    No new- Personally Reviewed  Physical Exam   GEN: No acute distress, thin elderly.   Neck: No JVD Cardiac: IRRR, no murmurs, rubs, or gallops.  Respiratory: Clear to auscultation bilaterally. GI: Soft, nontender, non-distended  MS: No edema; No deformity. Neuro:  Nonfocal  Psych: Normal affect   Labs    Chemistry Recent Labs  Lab 06/27/17 1543 06/29/17 1500  07/01/17 0804 07/01/17 1314 07/01/17 2033 07/02/17 0434  NA 124*  --    < > 127* 130* 136 139  K 5.1  --    < > 4.5 4.1  --  4.2  CL 89*  --    < >  90* 94*  --  101  CO2 28  --    < > 29 26  --  29  GLUCOSE 101*  --    < > 120* 154*  --  103*  BUN 13  --    < > 15 18  --  20  CREATININE 0.89  --    < > 0.96 1.12*  --  1.13*  CALCIUM 8.4  --    < > 9.0 8.9  --  9.3  PROT 6.7 6.4*  --   --   --   --   --   ALBUMIN 3.8 3.1*  --   --   --   --   --   AST 14 17  --   --   --   --   --   ALT 6 9  --   --   --   --   --   ALKPHOS 15* 17*  --   --   --   --   --   BILITOT 0.4 0.6  --   --   --   --   --  GFRNONAA  --   --    < > 50* 42*  --  41*  GFRAA  --   --    < > 58* 48*  --  48*  ANIONGAP  --   --    < > 8 10  --  9   < > = values in this interval not displayed.     Hematology Recent Labs  Lab 06/29/17 1500 07/01/17 0804 07/02/17 0434  WBC 7.4 8.5 6.8  RBC 3.95 4.02 3.95  HGB 11.9* 11.9* 11.6*  HCT 33.5* 34.7* 34.7*  MCV 84.8 86.3 87.8  MCH 30.1 29.6 29.4  MCHC 35.5 34.3 33.4  RDW 14.1 14.6 15.0  PLT 223 228 227    Cardiac Enzymes Recent Labs  Lab 06/29/17 2057  TROPONINI <0.03   No results for input(s): TROPIPOC in the last 168 hours.   BNPNo results for input(s): BNP, PROBNP in the last 168 hours.   DDimer No results for input(s): DDIMER in the last 168 hours.   Radiology    US Abdomen Complete  Result Date: 07/01/2017 CLINICAL DATA:  84 year old female with acute abdominal pain for several days. EXAM: ABDOMEN ULTRASOUND COMPLETE COMPARISON:  06/09/2015 CT and priors FINDINGS: Gallbladder: The gallbladder is contracted. There is no evidence of cholelithiasis or acute cholecystitis. Common bile duct: Diameter: 6 mm. No intrahepatic or extrahepatic biliary dilatation. Liver: No focal lesion identified. Within normal limits in parenchymal echogenicity. Portal vein is patent on color Doppler imaging with normal direction of blood flow towards the liver. IVC: No abnormality visualized. Pancreas: Visualized portion unremarkable. Spleen: Size and appearance within normal limits. Right Kidney: Length: 8.1 cm.  Echogenicity within normal limits. No mass or hydronephrosis visualized. Left Kidney: Length: 8 cm fullness of the LEFT intrarenal collecting system is unchanged from recent CT. A 3 mm nonobstructing calculus within the UPPER LEFT kidney. Echogenicity within normal limits. No mass visualized. Abdominal aorta: No aneurysm visualized. Other findings: None. IMPRESSION: 1. No acute abnormality. 2. Unchanged fullness of the LEFT intrarenal collecting system and 3 mm nonobstructing LEFT renal calculus Electronically Signed   By: Margarette Canada M.D.   On: 07/01/2017 19:47    Cardiac Studies   No new  Patient Profile     84 y.o. female persistent atrial fibrillation occasional bradycardia  Assessment & Plan    Atrial fibrillation/flutter persistent -Continue with rate control, low-dose Toprol 12.5 mg once a day seems to be working well for her.  No significant bradycardia.  No significant pauses with this.  Chronic anticoagulation -Warfarin.  No bleeding.  Continue to monitor closely  Hyponatremia -Currently off of tolvaptan.  Okay for discharge.  [facility] will sign off.   Medication Recommendations: Toprol 12.5 mg once a day Other recommendations (labs, testing, etc):  none Follow up as an outpatient: As previously, Dr. [name]  For questions or updates, please contact [facility] Please consult [email or web address] for contact info under Cardiology/STEMI.      Signed, Candee Furbish, MD  07/02/2017, 12:12 PM

## 2017-07-02 NOTE — Progress Notes (Signed)
[place] for coumadin Indication: atrial fibrillation  Allergies  Allergen Reactions  . Darifenacin Hydrobromide     REACTION: causes her dizziness  . Sulfonamide Derivatives Other (See Comments)    Pt thinks it caused dizziness  . Tramadol Other (See Comments)    Insomnia, anorexia    Patient Measurements: Height: 4\' 11"  (149.9 cm) Weight: 93 lb 7.6 oz (42.4 kg) IBW/kg (Calculated) : 43.2   Vital Signs: Temp: 98.1 F (36.7 C) (07/02 0423) Temp Source: Oral (07/02 0423) BP: 152/83 (07/02 0913) Pulse Rate: 87 (07/02 0913)  Labs: Recent Labs    06/29/17 1500  06/29/17 2057  07/01/17 0804 07/01/17 0904 07/01/17 1314 07/02/17 0434  HGB 11.9*  --   --   --  11.9*  --   --  11.6*  HCT 33.5*  --   --   --  34.7*  --   --  34.7*  PLT 223  --   --   --  228  --   --  227  LABPROT 21.7*  --   --   --   --  22.7*  --  24.2*  INR 1.91  --   --   --   --  2.02  --  2.19  CREATININE  --    < >  --    < > 0.96  --  1.12* 1.13*  CKTOTAL 27*  --   --   --   --   --   --   --   TROPONINI  --   --  <0.03  --   --   --   --   --    < > = values in this interval not displayed.    Estimated Creatinine Clearance: 21.7 mL/min (A) (by C-G formula based on SCr of 1.13 mg/dL (H)).   Assessment: 84 yo F on warfarin PTA for Afib. Admitted for weakness/FTT and hypoNa. Pharmacy consulted to dose warfarin while admitted.   Baseline INR slightly subtherapeutic  Prior anticoagulation: warfarin 2.5 mg daily except 3.75 mg on Mondays, LD 6/29 AM  Significant events:  Today, 07/02/2017:  CBC: Hgb low but stable at baseline  INR now therapeutic, rising appropriately  Major drug interactions: Ensure Enlive has 25% RDA Vit K  No bleeding issues per nursing  Eating 50% of meals  Goal of Therapy: INR 2-3  Plan:  Warfarin 2.5 mg PO x 1. Would discharge on 2.5 mg daily until INR checked outpatient  Daily INR  CBC at least q72 hr while on  warfarin  Monitor for signs of bleeding or thrombosis   Reuel Boom, PharmD, BCPS [phone] 07/02/2017, 11:43 AM

## 2017-07-02 NOTE — Telephone Encounter (Signed)
Copied from [place] [phone]. Topic: Quick Communication - See Telephone Encounter >> Jul 02, 2017  8:47 AM Gardiner Ramus wrote: CRM for notification. See Telephone encounter for: 07/02/17. Pt daughter called to inform Penni Homans that the pt was admitted into [facility] 06/29/17. Pt was admitted for sodium levers were low. Heart rate was low. Blood pressure was up and down. Daughter (peggy) [phone]

## 2017-07-03 ENCOUNTER — Other Ambulatory Visit: Payer: Self-pay | Admitting: Pharmacist

## 2017-07-03 ENCOUNTER — Telehealth: Payer: Self-pay

## 2017-07-03 MED ORDER — WARFARIN SODIUM 2.5 MG PO TABS: ORAL_TABLET | ORAL | 3 refills | Status: DC

## 2017-07-03 NOTE — Telephone Encounter (Signed)
Pt was admitted on 06/29/17.

## 2017-07-03 NOTE — Telephone Encounter (Signed)
07/03/17   Transition Care Management Follow-up Telephone Call  ADMISSION DATE: 06/29/17 DISCHARGE DATE: 07/02/17   How have you been since you were released from the hospital? Feeling pretty good per patient. Do you understand why you were in the hospital? Yes  Do you understand the discharge instrcutions? Yes  Items Reviewed:  Medications reviewed: Yes Stopped Lasix   Allergies reviewed: Yes   Dietary changes reviewed: Cardiac diet per discharge. Patient states she is on regular diet   Referrals reviewed: Appointment scheduled for follow up.   Functional Questionnaire:  Activities of Daily Living (ADLs): Patient states she can perform all independently.  Any patient concerns? Not at this time per patient.   Confirmed importance and date/time of follow-up visits scheduled:Yes    Confirmed with patient if condition begins to worsen call PCP or go to the ER. Yes   Patient was given the office number and encouragred to call back with questions or concerns. Yes

## 2017-07-03 NOTE — Telephone Encounter (Signed)
Noted  

## 2017-07-04 NOTE — Telephone Encounter (Signed)
Called and left a message on daughter's phone to return call when able

## 2017-07-08 ENCOUNTER — Other Ambulatory Visit: Payer: Self-pay | Admitting: *Deleted

## 2017-07-08 NOTE — Telephone Encounter (Signed)
Msg left on the refill vm that the patient is out of medication and needs to pick this up today.

## 2017-07-08 NOTE — Telephone Encounter (Signed)
Warfarin 2.5mg  was sent on 07/03/17 with 40tabs with 3 refills; called pharmacy to verify and they stated they have the medication & has already filled it for the pt. So will deny this refill. She states that the pt & daughter are aware and they will be coming to pick it up.

## 2017-07-10 DIAGNOSIS — M791 Myalgia, unspecified site: Secondary | ICD-10-CM | POA: Diagnosis not present

## 2017-07-10 DIAGNOSIS — R51 Headache: Secondary | ICD-10-CM | POA: Diagnosis not present

## 2017-07-10 DIAGNOSIS — M542 Cervicalgia: Secondary | ICD-10-CM | POA: Diagnosis not present

## 2017-07-12 ENCOUNTER — Ambulatory Visit: Payer: Medicare Other | Admitting: *Deleted

## 2017-07-12 DIAGNOSIS — I4891 Unspecified atrial fibrillation: Secondary | ICD-10-CM

## 2017-07-12 DIAGNOSIS — Z5181 Encounter for therapeutic drug level monitoring: Secondary | ICD-10-CM

## 2017-07-12 LAB — POCT INR: INR: 2.6 (ref 2.0–3.0)

## 2017-07-12 NOTE — Patient Instructions (Signed)
Description   Continue on same dosage 1 tablet daily except 1.5 tablets on Mondays. Recheck INR in 3 weeks. Call our office if you are placed on any new medications or if you have any concerns [phone].

## 2017-07-19 ENCOUNTER — Ambulatory Visit (INDEPENDENT_AMBULATORY_CARE_PROVIDER_SITE_OTHER): Payer: Medicare Other | Admitting: Family Medicine

## 2017-07-19 VITALS — BP 148/78 | HR 60 | Temp 97.7°F | Resp 18 | Ht 59.0 in | Wt 93.8 lb

## 2017-07-19 DIAGNOSIS — I10 Essential (primary) hypertension: Secondary | ICD-10-CM | POA: Diagnosis not present

## 2017-07-19 DIAGNOSIS — E039 Hypothyroidism, unspecified: Secondary | ICD-10-CM | POA: Diagnosis not present

## 2017-07-19 DIAGNOSIS — D649 Anemia, unspecified: Secondary | ICD-10-CM

## 2017-07-19 DIAGNOSIS — R001 Bradycardia, unspecified: Secondary | ICD-10-CM

## 2017-07-19 DIAGNOSIS — R739 Hyperglycemia, unspecified: Secondary | ICD-10-CM

## 2017-07-19 DIAGNOSIS — R600 Localized edema: Secondary | ICD-10-CM

## 2017-07-19 LAB — COMPREHENSIVE METABOLIC PANEL
ALK PHOS: 13 U/L — AB (ref 39–117)
ALT: 11 U/L (ref 0–35)
AST: 13 U/L (ref 0–37)
Albumin: 3.7 g/dL (ref 3.5–5.2)
BILIRUBIN TOTAL: 0.4 mg/dL (ref 0.2–1.2)
BUN: 16 mg/dL (ref 6–23)
CO2: 31 meq/L (ref 19–32)
Calcium: 9.1 mg/dL (ref 8.4–10.5)
Chloride: 101 mEq/L (ref 96–112)
Creatinine, Ser: 0.97 mg/dL (ref 0.40–1.20)
GFR: 57.15 mL/min — ABNORMAL LOW (ref >60.00)
GLUCOSE: 92 mg/dL (ref 70–99)
Potassium: 4.2 mEq/L (ref 3.5–5.1)
Sodium: 138 mEq/L (ref 135–145)
TOTAL PROTEIN: 6.9 g/dL (ref 6.0–8.3)

## 2017-07-19 LAB — CBC
HCT: 34.3 % — ABNORMAL LOW (ref 36.0–46.0)
HEMOGLOBIN: 11.2 g/dL — AB (ref 12.0–15.0)
MCHC: 32.7 g/dL (ref 30.0–36.0)
MCV: 89.4 fl (ref 78.0–100.0)
Platelets: 224 10*3/uL (ref 150.0–400.0)
RBC: 3.83 Mil/uL — ABNORMAL LOW (ref 3.87–5.11)
RDW: 17 % — AB (ref 11.5–15.5)
WBC: 5.9 10*3/uL (ref 4.0–10.5)

## 2017-07-19 MED ORDER — METOPROLOL SUCCINATE ER 25 MG PO TB24: 12.5000 mg | ORAL_TABLET | Freq: Every day | ORAL | 5 refills | Status: DC

## 2017-07-19 MED ORDER — RAMIPRIL 10 MG PO CAPS: 10.0000 mg | ORAL_CAPSULE | Freq: Two times a day (BID) | ORAL | 2 refills | Status: DC

## 2017-07-19 NOTE — Patient Instructions (Signed)
Try the compression hose, knee highs on in am and off in pm. Minimize sodium, no more than 2000 mg daily, elevate feet above the heart for 15 minutes twice to three x daily Edema Edema is when you have too much fluid in your body or under your skin. Edema may make your legs, feet, and ankles swell up. Swelling is also common in looser tissues, like around your eyes. This is a common condition. It gets more common as you get older. There are many possible causes of edema. Eating too much salt (sodium) and being on your feet or sitting for a long time can cause edema in your legs, feet, and ankles. Hot weather may make edema worse. Edema is usually painless. Your skin may look swollen or shiny. Follow these instructions at home:  Keep the swollen body part raised (elevated) above the level of your heart when you are sitting or lying down.  Do not sit still or stand for a long time.  Do not wear tight clothes. Do not wear garters on your upper legs.  Exercise your legs. This can help the swelling go down.  Wear elastic bandages or support stockings as told by your doctor.  Eat a low-salt (low-sodium) diet to reduce fluid as told by your doctor.  Depending on the cause of your swelling, you may need to limit how much fluid you drink (fluid restriction).  Take over-the-counter and prescription medicines only as told by your doctor. Contact a doctor if:  Treatment is not working.  You have heart, liver, or kidney disease and have symptoms of edema.  You have sudden and unexplained weight gain. Get help right away if:  You have shortness of breath or chest pain.  You cannot breathe when you lie down.  You have pain, redness, or warmth in the swollen areas.  You have heart, liver, or kidney disease and get edema all of a sudden.  You have a fever and your symptoms get worse all of a sudden. Summary  Edema is when you have too much fluid in your body or under your skin.  Edema may  make your legs, feet, and ankles swell up. Swelling is also common in looser tissues, like around your eyes.  Raise (elevate) the swollen body part above the level of your heart when you are sitting or lying down.  Follow your doctor's instructions about diet and how much fluid you can drink (fluid restriction). This information is not intended to replace advice given to you by your health care provider. Make sure you discuss any questions you have with your health care provider. Document Released: 06/06/2007 Document Revised: 01/06/2016 Document Reviewed: 01/06/2016 Elsevier Interactive Patient Education  2017 [employer].

## 2017-07-21 NOTE — Assessment & Plan Note (Signed)
Improved on repeat. Minimize sodium and report any concerning symptoms, no changes will [facility]

## 2017-07-21 NOTE — Assessment & Plan Note (Signed)
Elevate feet above heart several times a day. Use knee high compression hose on in am off in pm. Minimize sodium will only use diuretics as needed.

## 2017-07-21 NOTE — Assessment & Plan Note (Signed)
hgba1c acceptable, minimize simple carbs. Increase exercise as tolerated.  

## 2017-07-21 NOTE — Progress Notes (Signed)
Subjective:    Patient ID: [name] [name], female    DOB: 1924/03/19, 84 y.o.   MRN: [id number]  No chief complaint on file.   HPI Patient is in today for follow up and is frustrated with her pedal edema. Better in the morning and worsens has she is up.no recent fall no injury no trauma. She continues to eat well. Denies CP/palp/SOB/HA/congestion/fevers/GI or GU c/o. Taking meds as prescribed  Past Medical History:  Diagnosis Date  . Anemia   . Anxiety   . Arthritis    hips, knees  . Atrial fibrillation ([place])   . Current use of long term anticoagulation   . Diverticulitis of colon 02/06/2015  . Diverticulosis   . Gait difficulty   . GERD (gastroesophageal reflux disease)   . Headache(784.0) 06/22/2012  . Hyperlipidemia   . Hypertension   . IBS (irritable bowel syndrome)   . Incontinence   . Loss of weight 06/05/2015  . Medicare annual wellness visit, subsequent 02/05/2014  . Melena   . Mitral and aortic regurgitation   . Mitral valve prolapse    hx of  . Osteopenia   . Pedal edema 10/01/2016  . Personal history of colonic polyps   . Renal lithiasis 06/26/2015  . Thyroid disease    hypo  . Unspecified adverse effect of other drug, medicinal and biological substance(995.29)   . Vitamin B12 deficiency 03/16/2016  . Vitamin D deficiency     Past Surgical History:  Procedure Laterality Date  . BOTOX INJECTION N/A 09/03/2012   Procedure: BOTOX INJECTION;  Surgeon: Inda Castle, MD;  Location: [facility];  Service: Endoscopy;  Laterality: N/A;  . BREAST BIOPSY Left   . BREAST LUMPECTOMY Right 08/18/2014   Procedure: RIGHT BREAST LUMPECTOMY;  Surgeon: Coralie Keens, MD;  Location: [place];  Service: General;  Laterality: Right;  . CARDIAC ELECTROPHYSIOLOGY MAPPING AND ABLATION    . CATARACT EXTRACTION, BILATERAL    . ESOPHAGOGASTRODUODENOSCOPY N/A 09/03/2012   Procedure: ESOPHAGOGASTRODUODENOSCOPY (EGD);  Surgeon: Inda Castle, MD;  Location: [facility];  Service: Endoscopy;  Laterality: N/A;  . ESOPHAGOSCOPY W/ BOTOX INJECTION    . I&D EXTREMITY Right 11/06/2012   Procedure: IRRIGATION AND DEBRIDEMENT EXTREMITY Right Ring Finger;  Surgeon: Tennis Must, MD;  Location: [facility];  Service: Orthopedics;  Laterality: Right;  . PARTIAL HYSTERECTOMY     ovaries left in place    Family History  Problem Relation Age of Onset  . Diabetes Mother   . CVA Mother   . Stroke Mother   . COPD Father   . Rheum arthritis Father   . Allergies Daughter   . COPD Daughter   . Stroke Daughter   . COPD Daughter        previous smoker  . Stroke Maternal Grandfather   . Heart disease Maternal Aunt   . Heart disease Maternal Uncle   . Colon cancer Neg Hx   . Colon polyps Neg Hx   . Esophageal cancer Neg Hx   . Gallbladder disease Neg Hx   . Kidney disease Neg Hx   . Heart attack Neg Hx   . Hypertension Neg Hx     Social History   Socioeconomic History  . Marital status: Widowed    Spouse name: Not on file  . Number of children: 5  . Years of education: Not on file  . Highest education level: Not on file  Occupational History  . Occupation: retired  Employer: RETIRED  Social Needs  . Financial resource strain: Not on file  . Food insecurity:    Worry: Not on file    Inability: Not on file  . Transportation needs:    Medical: Not on file    Non-medical: Not on file  Tobacco Use  . Smoking status: Never Smoker  . Smokeless tobacco: Never Used  Substance and Sexual Activity  . Alcohol use: No  . Drug use: No  . Sexual activity: Not on file    Comment: lives with Daughter, grandson and his family. no dietary restricitons.  Lifestyle  . Physical activity:    Days per week: Not on file    Minutes per session: Not on file  . Stress: Not on file  Relationships  . Social connections:    Talks on phone: Not on file    Gets together: Not on file    Attends religious service: Not on file    Active member of club or  organization: Not on file    Attends meetings of clubs or organizations: Not on file    Relationship status: Not on file  . Intimate partner violence:    Fear of current or ex partner: Not on file    Emotionally abused: Not on file    Physically abused: Not on file    Forced sexual activity: Not on file  Other Topics Concern  . Not on file  Social History Narrative  . Not on file    Outpatient Medications Prior to Visit  Medication Sig Dispense Refill  . ALPRAZolam (XANAX) 0.25 MG tablet Take 0.5-1 tablets (0.125-0.25 mg total) by mouth 2 (two) times daily as needed for anxiety. 30 tablet 2  . Cholecalciferol (VITAMIN D) 2000 UNITS CAPS Take 2,000 Units by mouth every other day.    . Cyanocobalamin (VITAMIN B-12) 500 MCG SUBL Place 1 tablet (500 mcg total) under the tongue daily at 2 PM. 150 tablet   . pantoprazole (PROTONIX) 40 MG tablet TAKE 1 TABLET (40 MG TOTAL) BY MOUTH DAILY. 90 tablet 1  . warfarin (COUMADIN) 2.5 MG tablet TAKE AS DIRECTED BY ANTICOAGULATION CLINIC. A 30 DAY SUPPLY 40 tablet 3  . metoprolol succinate (TOPROL-XL) 25 MG 24 hr tablet Take 0.5 tablets (12.5 mg total) by mouth daily. 15 tablet 0  . ramipril (ALTACE) 10 MG capsule TAKE 1 CAPSULE (10 MG TOTAL) BY MOUTH DAILY. 90 capsule 0  . azelastine (ASTELIN) 0.1 % nasal spray Place 2 sprays into both nostrils 2 (two) times daily. Use in each nostril as directed (Patient not taking: Reported on 06/29/2017) 30 mL 3  . furosemide (LASIX) 20 MG tablet Take 1 tablet (20 mg total) by mouth daily as needed. (Patient not taking: Reported on 06/29/2017) 30 tablet 6   No facility-administered medications prior to visit.     Allergies  Allergen Reactions  . Darifenacin Hydrobromide     REACTION: causes her dizziness  . Sulfonamide Derivatives Other (See Comments)    Pt thinks it caused dizziness  . Tramadol Other (See Comments)    Insomnia, anorexia    Review of Systems  Constitutional: Positive for malaise/fatigue.  Negative for fever.  HENT: Negative for congestion.   Eyes: Negative for blurred vision.  Respiratory: Negative for shortness of breath.   Cardiovascular: Positive for leg swelling. Negative for chest pain and palpitations.  Gastrointestinal: Negative for abdominal pain, blood in stool and nausea.  Genitourinary: Negative for dysuria and frequency.  Musculoskeletal: Negative for falls.  Skin: Negative for rash.  Neurological: Negative for dizziness, loss of consciousness and headaches.  Endo/Heme/Allergies: Negative for environmental allergies.  Psychiatric/Behavioral: Negative for depression. The patient is not nervous/anxious.        Objective:    Physical Exam  Constitutional: She is oriented to person, place, and time. She appears well-developed and well-nourished. No distress.  HENT:  Head: Normocephalic and atraumatic.  Nose: Nose normal.  Eyes: Right eye exhibits no discharge. Left eye exhibits no discharge.  Neck: Normal range of motion. Neck supple.  Cardiovascular: Normal rate and regular rhythm.  No murmur heard. Pulmonary/Chest: Effort normal and breath sounds normal.  Abdominal: Soft. Bowel sounds are normal. There is no tenderness.  Musculoskeletal: She exhibits no edema.  Neurological: She is alert and oriented to person, place, and time.  Skin: Skin is warm and dry.  Psychiatric: She has a normal mood and affect.  Nursing note and vitals reviewed.   BP (!) 148/78   Pulse 60   Temp 97.7 F (36.5 C) (Oral)   Resp 18   Ht 4\' 11"  (1.499 m)   Wt 93 lb 12.8 oz (42.5 kg)   SpO2 100%   BMI 18.95 kg/m  Wt Readings from Last 3 Encounters:  07/19/17 93 lb 12.8 oz (42.5 kg)  06/29/17 93 lb 7.6 oz (42.4 kg)  06/27/17 93 lb 12.8 oz (42.5 kg)     Lab Results  Component Value Date   WBC 5.9 07/19/2017   HGB 11.2 (L) 07/19/2017   HCT 34.3 (L) 07/19/2017   PLT 224.0 07/19/2017   GLUCOSE 92 07/19/2017   CHOL 182 05/09/2017   TRIG 171.0 (H) 05/09/2017   HDL  39.00 (L) 05/09/2017   LDLDIRECT 72.1 01/19/2009   LDLCALC 109 (H) 05/09/2017   ALT 11 07/19/2017   AST 13 07/19/2017   NA 138 07/19/2017   K 4.2 07/19/2017   CL 101 07/19/2017   CREATININE 0.97 07/19/2017   BUN 16 07/19/2017   CO2 31 07/19/2017   TSH 2.911 07/01/2017   INR 2.6 07/12/2017   HGBA1C 5.9 05/09/2017    Lab Results  Component Value Date   TSH 2.911 07/01/2017   Lab Results  Component Value Date   WBC 5.9 07/19/2017   HGB 11.2 (L) 07/19/2017   HCT 34.3 (L) 07/19/2017   MCV 89.4 07/19/2017   PLT 224.0 07/19/2017   Lab Results  Component Value Date   NA 138 07/19/2017   K 4.2 07/19/2017   CO2 31 07/19/2017   GLUCOSE 92 07/19/2017   BUN 16 07/19/2017   CREATININE 0.97 07/19/2017   BILITOT 0.4 07/19/2017   ALKPHOS 13 (L) 07/19/2017   AST 13 07/19/2017   ALT 11 07/19/2017   PROT 6.9 07/19/2017   ALBUMIN 3.7 07/19/2017   CALCIUM 9.1 07/19/2017   ANIONGAP 9 07/02/2017   GFR 57.15 (L) 07/19/2017   Lab Results  Component Value Date   CHOL 182 05/09/2017   Lab Results  Component Value Date   HDL 39.00 (L) 05/09/2017   Lab Results  Component Value Date   LDLCALC 109 (H) 05/09/2017   Lab Results  Component Value Date   TRIG 171.0 (H) 05/09/2017   Lab Results  Component Value Date   CHOLHDL 5 05/09/2017   Lab Results  Component Value Date   HGBA1C 5.9 05/09/2017       Assessment & Plan:   Problem List Items Addressed This Visit    Essential hypertension (Chronic)    Improved  on repeat. Minimize sodium and report any concerning symptoms, no changes will monito      Relevant Medications   metoprolol succinate (TOPROL-XL) 25 MG 24 hr tablet   ramipril (ALTACE) 10 MG capsule   Other Relevant Orders   Comprehensive metabolic panel (Completed)   CBC (Completed)   Hypothyroidism - Primary   Relevant Medications   metoprolol succinate (TOPROL-XL) 25 MG 24 hr tablet   Anemia    Mild and stable. Increase leafy greens, consider increased  lean red meat and using cast iron cookware. Continue to monitor, report any concerns      Hyperglycemia    hgba1c acceptable, minimize simple carbs. Increase exercise as tolerated      Pedal edema    Elevate feet above heart several times a day. Use knee high compression hose on in am off in pm. Minimize sodium will only use diuretics as needed.       Bradycardia    RR today         I have discontinued Kang P. Schneller's furosemide. I have also changed her ramipril. Additionally, I am having her maintain her Vitamin D, Vitamin B-12, pantoprazole, ALPRAZolam, azelastine, warfarin, and metoprolol succinate.  Meds ordered this encounter  Medications  . metoprolol succinate (TOPROL-XL) 25 MG 24 hr tablet    Sig: Take 0.5 tablets (12.5 mg total) by mouth daily.    Dispense:  15 tablet    Refill:  5  . ramipril (ALTACE) 10 MG capsule    Sig: Take 1 capsule (10 mg total) by mouth 2 (two) times daily.    Dispense:  60 capsule    Refill:  2     Penni Homans, MD

## 2017-07-21 NOTE — Assessment & Plan Note (Signed)
Mild and stable. Increase leafy greens, consider increased lean red meat and using cast iron cookware. Continue to monitor, report any concerns

## 2017-07-21 NOTE — Assessment & Plan Note (Signed)
RR today

## 2017-07-24 DIAGNOSIS — M791 Myalgia, unspecified site: Secondary | ICD-10-CM | POA: Diagnosis not present

## 2017-07-24 DIAGNOSIS — R51 Headache: Secondary | ICD-10-CM | POA: Diagnosis not present

## 2017-07-24 DIAGNOSIS — M542 Cervicalgia: Secondary | ICD-10-CM | POA: Diagnosis not present

## 2017-08-02 ENCOUNTER — Ambulatory Visit: Payer: Medicare Other | Admitting: *Deleted

## 2017-08-02 DIAGNOSIS — I4891 Unspecified atrial fibrillation: Secondary | ICD-10-CM

## 2017-08-02 DIAGNOSIS — Z5181 Encounter for therapeutic drug level monitoring: Secondary | ICD-10-CM

## 2017-08-02 LAB — POCT INR: INR: 2.9 (ref 2.0–3.0)

## 2017-08-02 NOTE — Patient Instructions (Signed)
Description   Continue on same dosage 1 tablet daily except 1.5 tablets on Mondays. Recheck INR in 4 weeks. Call our office if you are placed on any new medications or if you have any concerns [phone].

## 2017-08-05 ENCOUNTER — Other Ambulatory Visit: Payer: Self-pay | Admitting: Family Medicine

## 2017-08-07 ENCOUNTER — Ambulatory Visit: Payer: Self-pay | Admitting: Family Medicine

## 2017-08-07 ENCOUNTER — Other Ambulatory Visit: Payer: Self-pay

## 2017-08-07 MED ORDER — FERROUS FUMARATE-FOLIC ACID 324-1 MG PO TABS: 1.0000 | ORAL_TABLET | Freq: Every day | ORAL | 3 refills | Status: DC

## 2017-08-08 ENCOUNTER — Encounter: Payer: Self-pay | Admitting: Family Medicine

## 2017-08-08 ENCOUNTER — Ambulatory Visit (INDEPENDENT_AMBULATORY_CARE_PROVIDER_SITE_OTHER): Payer: Medicare Other | Admitting: Family Medicine

## 2017-08-08 VITALS — BP 167/79 | HR 77

## 2017-08-08 DIAGNOSIS — I10 Essential (primary) hypertension: Secondary | ICD-10-CM | POA: Diagnosis not present

## 2017-08-08 MED ORDER — METOPROLOL SUCCINATE ER 50 MG PO TB24: 50.0000 mg | ORAL_TABLET | Freq: Every day | ORAL | 3 refills | Status: DC

## 2017-08-08 NOTE — Progress Notes (Addendum)
Pre visit review using our clinic tool,if applicable. No additional management support is needed unless otherwise documented below in the visit note.   Pt here for Blood pressure check per Dr. Frederik Pear dates 07/19/17.  Pt currently takes: Metoprolol 25 mg 1/2 tablet daily.   Pt reports she has been taking 25 mg daily however,ordered 12.5 (1/2)  tablet daily. States taking the whole tablet keeps her heart from beating too fast at bedtime. Patient states she has taken BP medication this   BP today @ = 167/79 HR = 77  Pt advised per Dr. Charlett Blake patient to increase Metoprolol to 50 mg daily. Take 25 mg bid. Return in 2 weeks for BP re-check. If patient starts to feel bad may come in earlier than 2 weeks. Patient agreed. Refill sent to pharmacy.   Nursing BP check note reviewed. Agree with documention and plan.

## 2017-08-09 ENCOUNTER — Ambulatory Visit: Payer: Medicare Other | Admitting: Family Medicine

## 2017-08-09 ENCOUNTER — Ambulatory Visit: Payer: Medicare Other

## 2017-08-12 DIAGNOSIS — Z79899 Other long term (current) drug therapy: Secondary | ICD-10-CM | POA: Diagnosis not present

## 2017-08-12 DIAGNOSIS — M542 Cervicalgia: Secondary | ICD-10-CM | POA: Diagnosis not present

## 2017-08-12 DIAGNOSIS — M791 Myalgia, unspecified site: Secondary | ICD-10-CM | POA: Diagnosis not present

## 2017-08-12 DIAGNOSIS — R51 Headache: Secondary | ICD-10-CM | POA: Diagnosis not present

## 2017-08-13 ENCOUNTER — Other Ambulatory Visit: Payer: Self-pay | Admitting: *Deleted

## 2017-08-13 MED ORDER — AZELASTINE HCL 0.1 % NA SOLN: 2.0000 | Freq: Two times a day (BID) | NASAL | 5 refills | Status: DC

## 2017-08-21 ENCOUNTER — Ambulatory Visit (INDEPENDENT_AMBULATORY_CARE_PROVIDER_SITE_OTHER): Payer: Medicare Other | Admitting: Internal Medicine

## 2017-08-21 VITALS — BP 169/71 | HR 64

## 2017-08-21 DIAGNOSIS — I10 Essential (primary) hypertension: Secondary | ICD-10-CM

## 2017-08-21 MED ORDER — AMLODIPINE BESYLATE 2.5 MG PO TABS: 2.5000 mg | ORAL_TABLET | Freq: Every day | ORAL | 1 refills | Status: DC

## 2017-08-21 NOTE — Progress Notes (Signed)
Pre visit review using our clinic tool,if applicable. No additional management support is needed unless otherwise documented below in the visit note.    Pt here for Blood pressure check per order from Dr. Charlett Blake dated 08/08/17.  Pt currently takes: Metoprolol 50 mg 1/2 tablet BID   Pt reports compliance with medication. Patient brought in recordings of BP taken at home which range from 117/65 to 160/72    BP today  = 169/71  P = 64  Pt advised per Dr. Larose Kells  to monitor salt intake. Add Amlodipine 2.5 mg daily to medication regimen. Watch for edema after starting medication. Return for scheduled office visit with Dr. Charlett Blake.

## 2017-08-21 NOTE — Progress Notes (Addendum)
Here for BP check. BP checked x 2: 183/75 and 169/71.  Pulse in the 60s. BPs at home range from 160 to  117. Currently on metoprolol 50 mg half tablet B.I.D., Altace.   Will recommend the following: Watch salt intake Add amlodipine 2.5 mg, #30, 1 refill. Watch for side effects such as edema. Follow-up with PCP in few days as a schedule. JP

## 2017-08-30 ENCOUNTER — Ambulatory Visit (HOSPITAL_BASED_OUTPATIENT_CLINIC_OR_DEPARTMENT_OTHER)
Admission: RE | Admit: 2017-08-30 | Discharge: 2017-08-30 | Disposition: A | Discharge status: Home / Self Care | Payer: Medicare Other | Source: Ambulatory Visit | Attending: Family Medicine | Admitting: Family Medicine

## 2017-08-30 ENCOUNTER — Ambulatory Visit: Payer: Medicare Other | Admitting: *Deleted

## 2017-08-30 ENCOUNTER — Ambulatory Visit (INDEPENDENT_AMBULATORY_CARE_PROVIDER_SITE_OTHER): Payer: Medicare Other | Admitting: Family Medicine

## 2017-08-30 ENCOUNTER — Telehealth: Payer: Self-pay | Admitting: Family Medicine

## 2017-08-30 VITALS — BP 152/80 | HR 64 | Temp 98.1°F | Resp 18 | Wt 93.4 lb

## 2017-08-30 DIAGNOSIS — R05 Cough: Secondary | ICD-10-CM

## 2017-08-30 DIAGNOSIS — R350 Frequency of micturition: Secondary | ICD-10-CM

## 2017-08-30 DIAGNOSIS — I4891 Unspecified atrial fibrillation: Secondary | ICD-10-CM | POA: Diagnosis not present

## 2017-08-30 DIAGNOSIS — R059 Cough, unspecified: Secondary | ICD-10-CM

## 2017-08-30 DIAGNOSIS — D649 Anemia, unspecified: Secondary | ICD-10-CM | POA: Diagnosis not present

## 2017-08-30 DIAGNOSIS — R42 Dizziness and giddiness: Secondary | ICD-10-CM | POA: Diagnosis not present

## 2017-08-30 DIAGNOSIS — I10 Essential (primary) hypertension: Secondary | ICD-10-CM

## 2017-08-30 DIAGNOSIS — J439 Emphysema, unspecified: Secondary | ICD-10-CM | POA: Diagnosis not present

## 2017-08-30 DIAGNOSIS — R739 Hyperglycemia, unspecified: Secondary | ICD-10-CM

## 2017-08-30 DIAGNOSIS — Z5181 Encounter for therapeutic drug level monitoring: Secondary | ICD-10-CM | POA: Diagnosis not present

## 2017-08-30 LAB — CBC WITH DIFFERENTIAL/PLATELET
Basophils Absolute: 0 10*3/uL (ref 0.0–0.1)
Basophils Relative: 0.2 % (ref 0.0–3.0)
EOS ABS: 0 10*3/uL (ref 0.0–0.7)
Eosinophils Relative: 0.5 % (ref 0.0–5.0)
HCT: 33.3 % — ABNORMAL LOW (ref 36.0–46.0)
HEMOGLOBIN: 11.2 g/dL — AB (ref 12.0–15.0)
LYMPHS ABS: 1.2 10*3/uL (ref 0.7–4.0)
Lymphocytes Relative: 18.1 % (ref 12.0–46.0)
MCHC: 33.7 g/dL (ref 30.0–36.0)
MCV: 88.9 fl (ref 78.0–100.0)
MONO ABS: 0.4 10*3/uL (ref 0.1–1.0)
Monocytes Relative: 5.5 % (ref 3.0–12.0)
NEUTROS PCT: 75.7 % (ref 43.0–77.0)
Neutro Abs: 5.1 10*3/uL (ref 1.4–7.7)
PLATELETS: 207 10*3/uL (ref 150.0–400.0)
RBC: 3.75 Mil/uL — ABNORMAL LOW (ref 3.87–5.11)
RDW: 17 % — ABNORMAL HIGH (ref 11.5–15.5)
WBC: 6.8 10*3/uL (ref 4.0–10.5)

## 2017-08-30 LAB — COMPREHENSIVE METABOLIC PANEL
ALBUMIN: 3.8 g/dL (ref 3.5–5.2)
ALT: 8 U/L (ref 0–35)
AST: 13 U/L (ref 0–37)
Alkaline Phosphatase: 12 U/L — ABNORMAL LOW (ref 39–117)
BUN: 15 mg/dL (ref 6–23)
CHLORIDE: 100 meq/L (ref 96–112)
CO2: 32 meq/L (ref 19–32)
CREATININE: 0.92 mg/dL (ref 0.40–1.20)
Calcium: 9.2 mg/dL (ref 8.4–10.5)
GFR: 60.73 mL/min (ref >60.00)
GLUCOSE: 107 mg/dL — AB (ref 70–99)
POTASSIUM: 4.1 meq/L (ref 3.5–5.1)
SODIUM: 139 meq/L (ref 135–145)
Total Bilirubin: 0.5 mg/dL (ref 0.2–1.2)
Total Protein: 6.8 g/dL (ref 6.0–8.3)

## 2017-08-30 LAB — URINALYSIS
Bilirubin Urine: NEGATIVE
HGB URINE DIPSTICK: NEGATIVE
KETONES UR: NEGATIVE
Leukocytes, UA: NEGATIVE
Nitrite: NEGATIVE
Specific Gravity, Urine: 1.01 (ref 1.000–1.030)
TOTAL PROTEIN, URINE-UPE24: NEGATIVE
URINE GLUCOSE: NEGATIVE
UROBILINOGEN UA: 0.2 (ref 0.0–1.0)
pH: 6.5 (ref 5.0–8.0)

## 2017-08-30 LAB — FERRITIN: FERRITIN: 103.8 ng/mL (ref 10.0–291.0)

## 2017-08-30 LAB — POCT INR: INR: 1.9 — AB (ref 2.0–3.0)

## 2017-08-30 MED ORDER — CEFDINIR 300 MG PO CAPS: 300.0000 mg | ORAL_CAPSULE | Freq: Two times a day (BID) | ORAL | 0 refills | Status: AC

## 2017-08-30 MED ORDER — PANTOPRAZOLE SODIUM 40 MG PO TBEC: DELAYED_RELEASE_TABLET | ORAL | 1 refills | Status: DC

## 2017-08-30 NOTE — Telephone Encounter (Signed)
Copied from [place] [id number]. Topic: Quick Communication - See Telephone Encounter >> Aug 30, 2017  4:23 PM Blase Mess A wrote: CRM for notification. See Telephone encounter for: 08/30/17. Dr Conan [facility] Call Back [phone] Chest X Ray

## 2017-08-30 NOTE — Patient Instructions (Signed)
Description   Today take 1.5 tablets then continue on same dosage 1 tablet daily except 1.5 tablets on Mondays. Recheck INR in 3 weeks. Call our office if you are placed on any new medications or if you have any concerns [phone].

## 2017-08-30 NOTE — Patient Instructions (Signed)

## 2017-08-31 LAB — URINE CULTURE
MICRO NUMBER: 91041398
SPECIMEN QUALITY: ADEQUATE

## 2017-09-02 DIAGNOSIS — R05 Cough: Secondary | ICD-10-CM | POA: Insufficient documentation

## 2017-09-02 DIAGNOSIS — R059 Cough, unspecified: Secondary | ICD-10-CM | POA: Insufficient documentation

## 2017-09-02 NOTE — Progress Notes (Signed)
Subjective:    Patient ID: [name] [name], female    DOB: 1924/05/04, 84 y.o.   MRN: [id number]  No chief complaint on file.   HPI Patient is in today for follow up and is noting sudden onset of SOB after stating the Amlodipine so seh stopped it and it improved. It did not start with the first dose.she has noted some congestion and cough as well. No obvious fevers or chills. Notes some urinary frequency but no dysuria or hematuria. Has trouble with reflux and dysphagia at times. Denies CP/palp/HA/fevers or GU c/o. Taking meds as prescribed.   Past Medical History:  Diagnosis Date  . Anemia   . Anxiety   . Arthritis    hips, knees  . Atrial fibrillation ([place])   . Current use of long term anticoagulation   . Diverticulitis of colon 02/06/2015  . Diverticulosis   . Gait difficulty   . GERD (gastroesophageal reflux disease)   . Headache(784.0) 06/22/2012  . Hyperlipidemia   . Hypertension   . IBS (irritable bowel syndrome)   . Incontinence   . Loss of weight 06/05/2015  . Medicare annual wellness visit, subsequent 02/05/2014  . Melena   . Mitral and aortic regurgitation   . Mitral valve prolapse    hx of  . Osteopenia   . Pedal edema 10/01/2016  . Personal history of colonic polyps   . Renal lithiasis 06/26/2015  . Thyroid disease    hypo  . Unspecified adverse effect of other drug, medicinal and biological substance(995.29)   . Vitamin B12 deficiency 03/16/2016  . Vitamin D deficiency     Past Surgical History:  Procedure Laterality Date  . BOTOX INJECTION N/A 09/03/2012   Procedure: BOTOX INJECTION;  Surgeon: Inda Castle, MD;  Location: [facility];  Service: Endoscopy;  Laterality: N/A;  . BREAST BIOPSY Left   . BREAST LUMPECTOMY Right 08/18/2014   Procedure: RIGHT BREAST LUMPECTOMY;  Surgeon: Coralie Keens, MD;  Location: [place];  Service: General;  Laterality: Right;  . CARDIAC ELECTROPHYSIOLOGY MAPPING AND ABLATION    . CATARACT EXTRACTION,  BILATERAL    . ESOPHAGOGASTRODUODENOSCOPY N/A 09/03/2012   Procedure: ESOPHAGOGASTRODUODENOSCOPY (EGD);  Surgeon: Inda Castle, MD;  Location: [facility];  Service: Endoscopy;  Laterality: N/A;  . ESOPHAGOSCOPY W/ BOTOX INJECTION    . I&D EXTREMITY Right 11/06/2012   Procedure: IRRIGATION AND DEBRIDEMENT EXTREMITY Right Ring Finger;  Surgeon: Tennis Must, MD;  Location: [place];  Service: Orthopedics;  Laterality: Right;  . PARTIAL HYSTERECTOMY     ovaries left in place    Family History  Problem Relation Age of Onset  . Diabetes Mother   . CVA Mother   . Stroke Mother   . COPD Father   . Rheum arthritis Father   . Allergies Daughter   . COPD Daughter   . Stroke Daughter   . COPD Daughter        previous smoker  . Stroke Maternal Grandfather   . Heart disease Maternal Aunt   . Heart disease Maternal Uncle   . Colon cancer Neg Hx   . Colon polyps Neg Hx   . Esophageal cancer Neg Hx   . Gallbladder disease Neg Hx   . Kidney disease Neg Hx   . Heart attack Neg Hx   . Hypertension Neg Hx     Social History   Socioeconomic History  . Marital status: Widowed    Spouse name: Not on file  .  Number of children: 5  . Years of education: Not on file  . Highest education level: Not on file  Occupational History  . Occupation: retired    Fish farm manager: RETIRED  Social Needs  . Financial resource strain: Not on file  . Food insecurity:    Worry: Not on file    Inability: Not on file  . Transportation needs:    Medical: Not on file    Non-medical: Not on file  Tobacco Use  . Smoking status: Never Smoker  . Smokeless tobacco: Never Used  Substance and Sexual Activity  . Alcohol use: No  . Drug use: No  . Sexual activity: Not on file    Comment: lives with Daughter, grandson and his family. no dietary restricitons.  Lifestyle  . Physical activity:    Days per week: Not on file    Minutes per session: Not on file  . Stress: Not on file  Relationships  . Social  connections:    Talks on phone: Not on file    Gets together: Not on file    Attends religious service: Not on file    Active member of club or organization: Not on file    Attends meetings of clubs or organizations: Not on file    Relationship status: Not on file  . Intimate partner violence:    Fear of current or ex partner: Not on file    Emotionally abused: Not on file    Physically abused: Not on file    Forced sexual activity: Not on file  Other Topics Concern  . Not on file  Social History Narrative  . Not on file    Outpatient Medications Prior to Visit  Medication Sig Dispense Refill  . ALPRAZolam (XANAX) 0.25 MG tablet Take 0.5-1 tablets (0.125-0.25 mg total) by mouth 2 (two) times daily as needed for anxiety. 30 tablet 2  . azelastine (ASTELIN) 0.1 % nasal spray Place 2 sprays into both nostrils 2 (two) times daily. Use in each nostril as directed 30 mL 5  . Cholecalciferol (VITAMIN D) 2000 UNITS CAPS Take 2,000 Units by mouth every other day.    . Cyanocobalamin (VITAMIN B-12) 500 MCG SUBL Place 1 tablet (500 mcg total) under the tongue daily at 2 PM. 150 tablet   . Ferrous Fumarate-Folic Acid (HEMOCYTE-F) 324-1 MG TABS Take 1 tablet by mouth daily. 30 each 3  . metoprolol succinate (TOPROL-XL) 50 MG 24 hr tablet Take 1 tablet (50 mg total) by mouth daily. Take 1/2 tablet 2 times daily with meals.. 90 tablet 3  . ramipril (ALTACE) 10 MG capsule Take 1 capsule (10 mg total) by mouth 2 (two) times daily. 60 capsule 2  . warfarin (COUMADIN) 2.5 MG tablet TAKE AS DIRECTED BY ANTICOAGULATION CLINIC. A 30 DAY SUPPLY 40 tablet 3  . amLODipine (NORVASC) 2.5 MG tablet Take 1 tablet (2.5 mg total) by mouth daily. 30 tablet 1  . pantoprazole (PROTONIX) 40 MG tablet TAKE 1 TABLET (40 MG TOTAL) BY MOUTH DAILY. 90 tablet 1   No facility-administered medications prior to visit.     Allergies  Allergen Reactions  . Amlodipine Shortness Of Breath  . Darifenacin Hydrobromide      REACTION: causes her dizziness  . Sulfonamide Derivatives Other (See Comments)    Pt thinks it caused dizziness  . Tramadol Other (See Comments)    Insomnia, anorexia    Review of Systems  Constitutional: Positive for malaise/fatigue. Negative for fever.  HENT: Negative for  congestion.   Eyes: Negative for blurred vision.  Respiratory: Positive for cough. Negative for shortness of breath.   Cardiovascular: Negative for chest pain, palpitations and leg swelling.  Gastrointestinal: Positive for abdominal pain and heartburn. Negative for blood in stool and nausea.  Genitourinary: Positive for frequency. Negative for dysuria.  Musculoskeletal: Negative for falls.  Skin: Negative for rash.  Neurological: Negative for dizziness, loss of consciousness and headaches.  Endo/Heme/Allergies: Negative for environmental allergies.  Psychiatric/Behavioral: Negative for depression. The patient is not nervous/anxious.        Objective:    Physical Exam  Constitutional: She is oriented to person, place, and time. She appears well-developed and well-nourished. No distress.  HENT:  Head: Normocephalic and atraumatic.  Nose: Nose normal.  Eyes: Right eye exhibits no discharge. Left eye exhibits no discharge.  Neck: Normal range of motion. Neck supple.  Cardiovascular: Normal rate and regular rhythm.  Murmur heard. Pulmonary/Chest: Effort normal and breath sounds normal.  Abdominal: Soft. Bowel sounds are normal. There is no tenderness.  Musculoskeletal: She exhibits no edema.  Neurological: She is alert and oriented to person, place, and time.  Skin: Skin is warm and dry.  Psychiatric: She has a normal mood and affect.  Nursing note and vitals reviewed.   BP (!) 152/80 (BP Location: Left Arm, Patient Position: Sitting, Cuff Size: Normal)   Pulse 64   Temp 98.1 F (36.7 C) (Oral)   Resp 18   Wt 93 lb 6.4 oz (42.4 kg)   SpO2 98%   BMI 18.86 kg/m  Wt Readings from Last 3 Encounters:    08/30/17 93 lb 6.4 oz (42.4 kg)  07/19/17 93 lb 12.8 oz (42.5 kg)  06/29/17 93 lb 7.6 oz (42.4 kg)     Lab Results  Component Value Date   WBC 6.8 08/30/2017   HGB 11.2 (L) 08/30/2017   HCT 33.3 (L) 08/30/2017   PLT 207.0 08/30/2017   GLUCOSE 107 (H) 08/30/2017   CHOL 182 05/09/2017   TRIG 171.0 (H) 05/09/2017   HDL 39.00 (L) 05/09/2017   LDLDIRECT 72.1 01/19/2009   LDLCALC 109 (H) 05/09/2017   ALT 8 08/30/2017   AST 13 08/30/2017   NA 139 08/30/2017   K 4.1 08/30/2017   CL 100 08/30/2017   CREATININE 0.92 08/30/2017   BUN 15 08/30/2017   CO2 32 08/30/2017   TSH 2.911 07/01/2017   INR 1.9 (A) 08/30/2017   HGBA1C 5.9 05/09/2017    Lab Results  Component Value Date   TSH 2.911 07/01/2017   Lab Results  Component Value Date   WBC 6.8 08/30/2017   HGB 11.2 (L) 08/30/2017   HCT 33.3 (L) 08/30/2017   MCV 88.9 08/30/2017   PLT 207.0 08/30/2017   Lab Results  Component Value Date   NA 139 08/30/2017   K 4.1 08/30/2017   CO2 32 08/30/2017   GLUCOSE 107 (H) 08/30/2017   BUN 15 08/30/2017   CREATININE 0.92 08/30/2017   BILITOT 0.5 08/30/2017   ALKPHOS 12 (L) 08/30/2017   AST 13 08/30/2017   ALT 8 08/30/2017   PROT 6.8 08/30/2017   ALBUMIN 3.8 08/30/2017   CALCIUM 9.2 08/30/2017   ANIONGAP 9 07/02/2017   GFR 60.73 08/30/2017   Lab Results  Component Value Date   CHOL 182 05/09/2017   Lab Results  Component Value Date   HDL 39.00 (L) 05/09/2017   Lab Results  Component Value Date   LDLCALC 109 (H) 05/09/2017   Lab Results  Component Value Date   TRIG 171.0 (H) 05/09/2017   Lab Results  Component Value Date   CHOLHDL 5 05/09/2017   Lab Results  Component Value Date   HGBA1C 5.9 05/09/2017       Assessment & Plan:   Problem List Items Addressed This Visit    Essential hypertension (Chronic)    She felt the Amlodipine caused her SOB when she took it s she stopped it and feels better. Will not restart it for now, xray shows possible  diverticulum. Will refer to GI given her history      Anemia   Relevant Orders   Ferritin (Completed)   Urinary frequency    Urine culture negative      Relevant Orders   Urinalysis (Completed)   Urine Culture (Completed)   Hyperglycemia    minimize simple carbs       Cough - Primary    Persistent for some time. Will try an empiric course of Cefdinir and mucinex bid      Relevant Orders   CBC w/Diff (Completed)   Comprehensive metabolic panel (Completed)   DG Chest 2 View (Completed)    Other Visit Diagnoses    Dizziness       Relevant Orders   Comprehensive metabolic panel (Completed)      I have discontinued Shontae P. Skeens's amLODipine. I am also having her start on cefdinir. Additionally, I am having her maintain her Vitamin D, Vitamin B-12, ALPRAZolam, warfarin, ramipril, Ferrous Fumarate-Folic Acid, metoprolol succinate, azelastine, and pantoprazole.  Meds ordered this encounter  Medications  . pantoprazole (PROTONIX) 40 MG tablet    Sig: TAKE 1 TABLET (40 MG TOTAL) BY MOUTH DAILY.    Dispense:  90 tablet    Refill:  1  . cefdinir (OMNICEF) 300 MG capsule    Sig: Take 1 capsule (300 mg total) by mouth 2 (two) times daily for 7 days.    Dispense:  20 capsule    Refill:  0     Penni Homans, MD

## 2017-09-02 NOTE — Assessment & Plan Note (Signed)
Persistent for some time. Will try an empiric course of Cefdinir and mucinex bid

## 2017-09-02 NOTE — Assessment & Plan Note (Signed)
She felt the Amlodipine caused her SOB when she took it s she stopped it and feels better. Will not restart it for now, xray shows possible diverticulum. Will refer to GI given her history

## 2017-09-02 NOTE — Assessment & Plan Note (Signed)
Urine culture negative.

## 2017-09-02 NOTE — Assessment & Plan Note (Signed)
minimize simple carbs

## 2017-09-12 ENCOUNTER — Other Ambulatory Visit: Payer: Self-pay | Admitting: Internal Medicine

## 2017-09-12 NOTE — Telephone Encounter (Signed)
Amlodipine refill denied, PCP discontinued it at the last visit.  Let patient know.

## 2017-09-13 ENCOUNTER — Ambulatory Visit (INDEPENDENT_AMBULATORY_CARE_PROVIDER_SITE_OTHER): Payer: Medicare Other

## 2017-09-13 DIAGNOSIS — I10 Essential (primary) hypertension: Secondary | ICD-10-CM

## 2017-09-13 MED ORDER — HYDRALAZINE HCL 10 MG PO TABS: 10.0000 mg | ORAL_TABLET | Freq: Three times a day (TID) | ORAL | 0 refills | Status: DC

## 2017-09-13 NOTE — Progress Notes (Signed)
Pre visit review using our clinic tool,if applicable. No additional management support is needed unless otherwise documented below in the visit note.   Pt here for Blood pressure check per order from Dr. Charlett Blake.  Pt currently takes:Metoprolol 50 mg daily  Pt reports compliance with medication.  BP today @ =180/70 P = 52  Pt advised per Debbrah Alar Add Hydralazine 10 mg tid. Return for BP check in 1 week.

## 2017-09-13 NOTE — Patient Instructions (Signed)
Pt advised per [name] [name] Add Hydralazine 10 mg tid. Return for BP check in 1 week.

## 2017-09-14 NOTE — Progress Notes (Signed)
Shelonda Saxe S O'Sullivan NP 

## 2017-09-20 ENCOUNTER — Ambulatory Visit: Payer: Medicare Other | Admitting: *Deleted

## 2017-09-20 ENCOUNTER — Ambulatory Visit (INDEPENDENT_AMBULATORY_CARE_PROVIDER_SITE_OTHER): Payer: Medicare Other | Admitting: Family Medicine

## 2017-09-20 VITALS — BP 137/54 | HR 60

## 2017-09-20 DIAGNOSIS — I4891 Unspecified atrial fibrillation: Secondary | ICD-10-CM

## 2017-09-20 DIAGNOSIS — Z5181 Encounter for therapeutic drug level monitoring: Secondary | ICD-10-CM | POA: Diagnosis not present

## 2017-09-20 DIAGNOSIS — I10 Essential (primary) hypertension: Secondary | ICD-10-CM | POA: Diagnosis not present

## 2017-09-20 LAB — POCT INR: INR: 1.9 — AB (ref 2.0–3.0)

## 2017-09-20 NOTE — Patient Instructions (Signed)
Description   Today take an extra 1/2 tablet, then change your  Dosage to  1 tablet daily except 1.5 tablets on Mondays and Fridays.  Recheck INR in 2 weeks. Call our office if you are placed on any new medications or if you have any concerns [phone].

## 2017-09-20 NOTE — Progress Notes (Signed)
Noted  Macoy Rodwell R Lowne Chase, DO  

## 2017-09-20 NOTE — Progress Notes (Signed)
Pre visit review using our clinic tool,if applicable. No additional management support is needed unless otherwise documented below in the visit note.   Pt here for Blood pressure check per order from Dr. Charlett Blake.   Pt currently takes: Metorpolol 50 mg, Hydralazine 10 mg tid.  States she just started Hydralazine on Tuesday because the pharmacy had to order.   Pt reports compliance with medication.  BP today @ = 137/54 P = 60  Pt advised per Dr. Carollee Herter DOD, continue taking madications as ordered. Return for appointment with Dr. Charlett Blake as scheduled.

## 2017-10-04 ENCOUNTER — Ambulatory Visit: Payer: Medicare Other

## 2017-10-04 DIAGNOSIS — Z5181 Encounter for therapeutic drug level monitoring: Secondary | ICD-10-CM

## 2017-10-04 DIAGNOSIS — I4891 Unspecified atrial fibrillation: Secondary | ICD-10-CM | POA: Diagnosis not present

## 2017-10-04 LAB — POCT INR: INR: 2.6 (ref 2.0–3.0)

## 2017-10-04 NOTE — Patient Instructions (Signed)
Description   Continue on same dosage 1 tablet daily except 1.5 tablets on Mondays and Fridays.  Recheck INR in 3 weeks. Call our office if you are placed on any new medications or if you have any concerns [phone].

## 2017-10-08 ENCOUNTER — Other Ambulatory Visit: Payer: Self-pay | Admitting: Family

## 2017-10-08 DIAGNOSIS — I10 Essential (primary) hypertension: Secondary | ICD-10-CM

## 2017-10-18 ENCOUNTER — Ambulatory Visit (INDEPENDENT_AMBULATORY_CARE_PROVIDER_SITE_OTHER): Payer: Medicare Other | Admitting: Family Medicine

## 2017-10-18 ENCOUNTER — Encounter: Payer: Self-pay | Admitting: Family Medicine

## 2017-10-18 DIAGNOSIS — I10 Essential (primary) hypertension: Secondary | ICD-10-CM

## 2017-10-18 DIAGNOSIS — Z23 Encounter for immunization: Secondary | ICD-10-CM | POA: Diagnosis not present

## 2017-10-18 DIAGNOSIS — E782 Mixed hyperlipidemia: Secondary | ICD-10-CM | POA: Diagnosis not present

## 2017-10-18 DIAGNOSIS — R739 Hyperglycemia, unspecified: Secondary | ICD-10-CM | POA: Diagnosis not present

## 2017-10-18 DIAGNOSIS — K222 Esophageal obstruction: Secondary | ICD-10-CM

## 2017-10-18 MED ORDER — FAMOTIDINE 20 MG PO TABS: 20.0000 mg | ORAL_TABLET | Freq: Every evening | ORAL | 3 refills | Status: DC

## 2017-10-18 NOTE — Patient Instructions (Signed)
Cough, Adult  Coughing is a reflex that clears your throat and your airways. Coughing helps to heal and protect your lungs. It is normal to cough occasionally, but a cough that happens with other symptoms or lasts a long time may be a sign of a condition that needs treatment. A cough may last only 2-3 weeks (acute), or it may last longer than 8 weeks (chronic).  What are the causes?  Coughing is commonly caused by:   Breathing in substances that irritate your lungs.   A viral or bacterial respiratory infection.   Allergies.   Asthma.   Postnasal drip.   Smoking.   Acid backing up from the stomach into the esophagus (gastroesophageal reflux).   Certain medicines.   Chronic lung problems, including COPD (or rarely, lung cancer).   Other medical conditions such as heart failure.    Follow these instructions at home:  Pay attention to any changes in your symptoms. Take these actions to help with your discomfort:   Take medicines only as told by your health care provider.  ? If you were prescribed an antibiotic medicine, take it as told by your health care provider. Do not stop taking the antibiotic even if you start to feel better.  ? Talk with your health care provider before you take a cough suppressant medicine.   Drink enough fluid to keep your urine clear or pale yellow.   If the air is dry, use a cold steam vaporizer or humidifier in your bedroom or your home to help loosen secretions.   Avoid anything that causes you to cough at work or at home.   If your cough is worse at night, try sleeping in a semi-upright position.   Avoid cigarette smoke. If you smoke, quit smoking. If you need help quitting, ask your health care provider.   Avoid caffeine.   Avoid alcohol.   Rest as needed.    Contact a health care provider if:   You have new symptoms.   You cough up pus.   Your cough does not get better after 2-3 weeks, or your cough gets worse.   You cannot control your cough with suppressant  medicines and you are losing sleep.   You develop pain that is getting worse or pain that is not controlled with pain medicines.   You have a fever.   You have unexplained weight loss.   You have night sweats.  Get help right away if:   You cough up blood.   You have difficulty breathing.   Your heartbeat is very fast.  This information is not intended to replace advice given to you by your health care provider. Make sure you discuss any questions you have with your health care provider.  Document Released: 06/16/2010 Document Revised: 05/26/2015 Document Reviewed: 02/24/2014  [employer]vier Interactive Patient Education  2018 [employer]

## 2017-10-20 NOTE — Progress Notes (Signed)
Subjective:    Patient ID: [name] [name], female    DOB: 1924-03-06, 84 y.o.   MRN: [id number]  No chief complaint on file.   HPI Patient is in today for follow up accompanied by family.  They reports she is doing better.  Her cough has largely improved.  Her dysphasia is doing fairly well at the present time as well and she is managing to eat daily. She is not always getting her protein. Notes her cough has improved some. No SOB. Denies CP/palp/SOB/HA/congestion/fevers/GI or GU c/o. Taking meds as prescribed  Past Medical History:  Diagnosis Date  . Anemia   . Anxiety   . Arthritis    hips, knees  . Atrial fibrillation ([place])   . Current use of long term anticoagulation   . Diverticulitis of colon 02/06/2015  . Diverticulosis   . Gait difficulty   . GERD (gastroesophageal reflux disease)   . Headache(784.0) 06/22/2012  . Hyperlipidemia   . Hypertension   . IBS (irritable bowel syndrome)   . Incontinence   . Loss of weight 06/05/2015  . Medicare annual wellness visit, subsequent 02/05/2014  . Melena   . Mitral and aortic regurgitation   . Mitral valve prolapse    hx of  . Osteopenia   . Pedal edema 10/01/2016  . Personal history of colonic polyps   . Renal lithiasis 06/26/2015  . Thyroid disease    hypo  . Unspecified adverse effect of other drug, medicinal and biological substance(995.29)   . Vitamin B12 deficiency 03/16/2016  . Vitamin D deficiency     Past Surgical History:  Procedure Laterality Date  . BOTOX INJECTION N/A 09/03/2012   Procedure: BOTOX INJECTION;  Surgeon: Inda Castle, MD;  Location: [facility];  Service: Endoscopy;  Laterality: N/A;  . BREAST BIOPSY Left   . BREAST LUMPECTOMY Right 08/18/2014   Procedure: RIGHT BREAST LUMPECTOMY;  Surgeon: Coralie Keens, MD;  Location: [facility];  Service: General;  Laterality: Right;  . CARDIAC ELECTROPHYSIOLOGY MAPPING AND ABLATION    . CATARACT EXTRACTION, BILATERAL    .  ESOPHAGOGASTRODUODENOSCOPY N/A 09/03/2012   Procedure: ESOPHAGOGASTRODUODENOSCOPY (EGD);  Surgeon: Inda Castle, MD;  Location: [facility];  Service: Endoscopy;  Laterality: N/A;  . ESOPHAGOSCOPY W/ BOTOX INJECTION    . I&D EXTREMITY Right 11/06/2012   Procedure: IRRIGATION AND DEBRIDEMENT EXTREMITY Right Ring Finger;  Surgeon: Tennis Must, MD;  Location: [facility];  Service: Orthopedics;  Laterality: Right;  . PARTIAL HYSTERECTOMY     ovaries left in place    Family History  Problem Relation Age of Onset  . Diabetes Mother   . CVA Mother   . Stroke Mother   . COPD Father   . Rheum arthritis Father   . Allergies Daughter   . COPD Daughter   . Stroke Daughter   . COPD Daughter        previous smoker  . Stroke Maternal Grandfather   . Heart disease Maternal Aunt   . Heart disease Maternal Uncle   . Colon cancer Neg Hx   . Colon polyps Neg Hx   . Esophageal cancer Neg Hx   . Gallbladder disease Neg Hx   . Kidney disease Neg Hx   . Heart attack Neg Hx   . Hypertension Neg Hx     Social History   Socioeconomic History  . Marital status: Widowed    Spouse name: Not on file  . Number of children: 5  .  Years of education: Not on file  . Highest education level: Not on file  Occupational History  . Occupation: retired    Fish farm manager: RETIRED  Social Needs  . Financial resource strain: Not on file  . Food insecurity:    Worry: Not on file    Inability: Not on file  . Transportation needs:    Medical: Not on file    Non-medical: Not on file  Tobacco Use  . Smoking status: Never Smoker  . Smokeless tobacco: Never Used  Substance and Sexual Activity  . Alcohol use: No  . Drug use: No  . Sexual activity: Not on file    Comment: lives with Daughter, grandson and his family. no dietary restricitons.  Lifestyle  . Physical activity:    Days per week: Not on file    Minutes per session: Not on file  . Stress: Not on file  Relationships  . Social connections:    Talks on  phone: Not on file    Gets together: Not on file    Attends religious service: Not on file    Active member of club or organization: Not on file    Attends meetings of clubs or organizations: Not on file    Relationship status: Not on file  . Intimate partner violence:    Fear of current or ex partner: Not on file    Emotionally abused: Not on file    Physically abused: Not on file    Forced sexual activity: Not on file  Other Topics Concern  . Not on file  Social History Narrative  . Not on file    Outpatient Medications Prior to Visit  Medication Sig Dispense Refill  . ALPRAZolam (XANAX) 0.25 MG tablet Take 0.5-1 tablets (0.125-0.25 mg total) by mouth 2 (two) times daily as needed for anxiety. 30 tablet 2  . azelastine (ASTELIN) 0.1 % nasal spray Place 2 sprays into both nostrils 2 (two) times daily. Use in each nostril as directed 30 mL 5  . Cholecalciferol (VITAMIN D) 2000 UNITS CAPS Take 2,000 Units by mouth every other day.    . Cyanocobalamin (VITAMIN B-12) 500 MCG SUBL Place 1 tablet (500 mcg total) under the tongue daily at 2 PM. 150 tablet   . hydrALAZINE (APRESOLINE) 10 MG tablet TAKE 1 TABLET BY MOUTH THREE TIMES A DAY 90 tablet 0  . metoprolol succinate (TOPROL-XL) 50 MG 24 hr tablet Take 1 tablet (50 mg total) by mouth daily. Take 1/2 tablet 2 times daily with meals.. 90 tablet 3  . pantoprazole (PROTONIX) 40 MG tablet TAKE 1 TABLET (40 MG TOTAL) BY MOUTH DAILY. 90 tablet 1  . ramipril (ALTACE) 10 MG capsule Take 1 capsule (10 mg total) by mouth 2 (two) times daily. 60 capsule 2  . warfarin (COUMADIN) 2.5 MG tablet TAKE AS DIRECTED BY ANTICOAGULATION CLINIC. A 30 DAY SUPPLY 40 tablet 3  . Ferrous Fumarate-Folic Acid (HEMOCYTE-F) 324-1 MG TABS Take 1 tablet by mouth daily. 30 each 3   No facility-administered medications prior to visit.     Allergies  Allergen Reactions  . Amlodipine Shortness Of Breath  . Darifenacin Hydrobromide     REACTION: causes her dizziness    . Sulfonamide Derivatives Other (See Comments)    Pt thinks it caused dizziness  . Tramadol Other (See Comments)    Insomnia, anorexia    Review of Systems  Constitutional: Positive for malaise/fatigue. Negative for fever.  HENT: Negative for congestion.   Eyes: Negative  for blurred vision.  Respiratory: Negative for shortness of breath.   Cardiovascular: Negative for chest pain, palpitations and leg swelling.  Gastrointestinal: Negative for abdominal pain, blood in stool and nausea.  Genitourinary: Negative for dysuria and frequency.  Musculoskeletal: Negative for falls.  Skin: Negative for rash.  Neurological: Negative for dizziness, loss of consciousness and headaches.  Endo/Heme/Allergies: Negative for environmental allergies.  Psychiatric/Behavioral: Negative for depression. The patient is not nervous/anxious.        Objective:    Physical Exam  Constitutional: She is oriented to person, place, and time. She appears well-developed and well-nourished. No distress.  HENT:  Head: Normocephalic and atraumatic.  Nose: Nose normal.  Eyes: Right eye exhibits no discharge. Left eye exhibits no discharge.  Neck: Normal range of motion. Neck supple.  Cardiovascular: Normal rate and regular rhythm.  Pulmonary/Chest: Effort normal and breath sounds normal.  Abdominal: Soft. Bowel sounds are normal. There is no tenderness.  Musculoskeletal: She exhibits no edema.  Neurological: She is alert and oriented to person, place, and time.  Skin: Skin is warm and dry.  Psychiatric: She has a normal mood and affect.  Nursing note and vitals reviewed.   BP 138/70 (BP Location: Left Arm, Patient Position: Sitting, Cuff Size: Normal)   Pulse (!) 56   Temp 97.6 F (36.4 C) (Oral)   Resp 18   Ht 4\' 11"  (1.499 m)   Wt 94 lb 9.6 oz (42.9 kg)   SpO2 95%   BMI 19.11 kg/m  Wt Readings from Last 3 Encounters:  10/18/17 94 lb 9.6 oz (42.9 kg)  08/30/17 93 lb 6.4 oz (42.4 kg)  07/19/17 93 lb  12.8 oz (42.5 kg)     Lab Results  Component Value Date   WBC 6.8 08/30/2017   HGB 11.2 (L) 08/30/2017   HCT 33.3 (L) 08/30/2017   PLT 207.0 08/30/2017   GLUCOSE 107 (H) 08/30/2017   CHOL 182 05/09/2017   TRIG 171.0 (H) 05/09/2017   HDL 39.00 (L) 05/09/2017   LDLDIRECT 72.1 01/19/2009   LDLCALC 109 (H) 05/09/2017   ALT 8 08/30/2017   AST 13 08/30/2017   NA 139 08/30/2017   K 4.1 08/30/2017   CL 100 08/30/2017   CREATININE 0.92 08/30/2017   BUN 15 08/30/2017   CO2 32 08/30/2017   TSH 2.911 07/01/2017   INR 2.6 10/04/2017   HGBA1C 5.9 05/09/2017    Lab Results  Component Value Date   TSH 2.911 07/01/2017   Lab Results  Component Value Date   WBC 6.8 08/30/2017   HGB 11.2 (L) 08/30/2017   HCT 33.3 (L) 08/30/2017   MCV 88.9 08/30/2017   PLT 207.0 08/30/2017   Lab Results  Component Value Date   NA 139 08/30/2017   K 4.1 08/30/2017   CO2 32 08/30/2017   GLUCOSE 107 (H) 08/30/2017   BUN 15 08/30/2017   CREATININE 0.92 08/30/2017   BILITOT 0.5 08/30/2017   ALKPHOS 12 (L) 08/30/2017   AST 13 08/30/2017   ALT 8 08/30/2017   PROT 6.8 08/30/2017   ALBUMIN 3.8 08/30/2017   CALCIUM 9.2 08/30/2017   ANIONGAP 9 07/02/2017   GFR 60.73 08/30/2017   Lab Results  Component Value Date   CHOL 182 05/09/2017   Lab Results  Component Value Date   HDL 39.00 (L) 05/09/2017   Lab Results  Component Value Date   LDLCALC 109 (H) 05/09/2017   Lab Results  Component Value Date   TRIG 171.0 (H) 05/09/2017  Lab Results  Component Value Date   CHOLHDL 5 05/09/2017   Lab Results  Component Value Date   HGBA1C 5.9 05/09/2017       Assessment & Plan:   Problem List Items Addressed This Visit    Essential hypertension (Chronic)    Well controlled, no changes to meds. Encouraged heart healthy diet such as the DASH diet and exercise as tolerated.       Hyperlipidemia, mixed    Encouraged heart healthy diet, increase exercise, avoid trans fats, consider a krill  oil cap daily      ESOPHAGEAL STRICTURE    Doing better at present and is eating OK at this time. Needs small frequent meals with protein at each meal      Hyperglycemia    She is encouraged to eat what she can tolerate and not worry too much about content if necessary to get her calories.          I have discontinued Aletha Halim. Grabbe's Ferrous Fumarate-Folic Acid. I am also having her start on famotidine. Additionally, I am having her maintain her Vitamin D, Vitamin B-12, ALPRAZolam, warfarin, ramipril, metoprolol succinate, azelastine, pantoprazole, and hydrALAZINE.  Meds ordered this encounter  Medications  . famotidine (PEPCID) 20 MG tablet    Sig: Take 1 tablet (20 mg total) by mouth every evening.    Dispense:  30 tablet    Refill:  3     Penni Homans, MD

## 2017-10-20 NOTE — Assessment & Plan Note (Signed)
She is encouraged to eat what she can tolerate and not worry too much about content if necessary to get her calories.

## 2017-10-20 NOTE — Assessment & Plan Note (Signed)
Doing better at present and is eating OK at this time. Needs small frequent meals with protein at each meal

## 2017-10-20 NOTE — Assessment & Plan Note (Signed)
Well controlled, no changes to meds. Encouraged heart healthy diet such as the DASH diet and exercise as tolerated.  °

## 2017-10-20 NOTE — Assessment & Plan Note (Signed)
Encouraged heart healthy diet, increase exercise, avoid trans fats, consider a krill oil cap daily 

## 2017-10-25 ENCOUNTER — Ambulatory Visit: Payer: Medicare Other | Admitting: Pharmacist

## 2017-10-25 DIAGNOSIS — Z5181 Encounter for therapeutic drug level monitoring: Secondary | ICD-10-CM

## 2017-10-25 DIAGNOSIS — I4891 Unspecified atrial fibrillation: Secondary | ICD-10-CM

## 2017-10-25 LAB — POCT INR: INR: 2.1 (ref 2.0–3.0)

## 2017-10-25 NOTE — Patient Instructions (Signed)
Description   Continue on same dosage 1 tablet daily except 1.5 tablets on Mondays and Fridays.  Recheck INR in 4 weeks. Call our office if you are placed on any new medications or if you have any concerns [phone].     

## 2017-11-10 ENCOUNTER — Other Ambulatory Visit: Payer: Self-pay | Admitting: Family Medicine

## 2017-11-10 DIAGNOSIS — I10 Essential (primary) hypertension: Secondary | ICD-10-CM

## 2017-11-11 NOTE — Telephone Encounter (Signed)
See refill protocol failure and advise?

## 2017-11-12 ENCOUNTER — Other Ambulatory Visit: Payer: Self-pay | Admitting: Cardiology

## 2017-11-22 ENCOUNTER — Ambulatory Visit (INDEPENDENT_AMBULATORY_CARE_PROVIDER_SITE_OTHER): Payer: Medicare Other | Admitting: *Deleted

## 2017-11-22 DIAGNOSIS — Z5181 Encounter for therapeutic drug level monitoring: Secondary | ICD-10-CM

## 2017-11-22 DIAGNOSIS — I4891 Unspecified atrial fibrillation: Secondary | ICD-10-CM | POA: Diagnosis not present

## 2017-11-22 LAB — POCT INR: INR: 2 (ref 2.0–3.0)

## 2017-11-22 NOTE — Patient Instructions (Signed)
Description   Continue on same dosage 1 tablet daily except 1.5 tablets on Mondays and Fridays.  Recheck INR in 4 weeks. Call our office if you are placed on any new medications or if you have any concerns [phone].

## 2017-12-06 ENCOUNTER — Other Ambulatory Visit: Payer: Self-pay | Admitting: Family Medicine

## 2017-12-08 ENCOUNTER — Other Ambulatory Visit: Payer: Self-pay | Admitting: Cardiology

## 2017-12-12 ENCOUNTER — Other Ambulatory Visit: Payer: Self-pay | Admitting: Family Medicine

## 2017-12-12 DIAGNOSIS — I10 Essential (primary) hypertension: Secondary | ICD-10-CM

## 2017-12-17 ENCOUNTER — Ambulatory Visit: Payer: Self-pay

## 2017-12-17 NOTE — Telephone Encounter (Signed)
Returned call to pt.  Reported onset of runny nose and sneezing since yesterday afternoon.  Stated right nostril is congested.  Denied cough, fever, or shortness of breath, sore throat, earache, wheezing, nausea or vomiting.  Stated she feels she is just beginning to get sick and requested advice.  Home care advice given per "common cold protocol."  Verb. Understanding.  Knows to call back if symptoms worsen.       Reason for Disposition . Cold with no complications  Answer Assessment - Initial Assessment Questions 1. ONSET: "When did the nasal discharge start?"      Last yesterday afternoon 2. AMOUNT: "How much discharge is there?"      Mild amt. Clear nasal drainage   3. COUGH: "Do you have a cough?" If yes, ask: "Describe the color of your sputum" (clear, white, yellow, green)     Denied  4. RESPIRATORY DISTRESS: "Describe your breathing."      Denied shortness of breath  5. FEVER: "Do you have a fever?" If so, ask: "What is your temperature, how was it measured, and when did it start?"     Denied fever; denied chills or body aches  6. SEVERITY: "Overall, how bad are you feeling right now?" (e.g., doesn't interfere with normal activities, staying home from school/work, staying in bed)      Feels this is just the beginning of symptoms 7. OTHER SYMPTOMS: "Do you have any other symptoms?" (e.g., sore throat, earache, wheezing, vomiting)     C/o intermittent sneezing.  Denied sore throat, earache, wheezing , nausea or vomiting.  8. PREGNANCY: "Is there any chance you are pregnant?" "When was your last menstrual period?"     N/a  Protocols used: COMMON COLD-A-AH Message from Margot Ables sent at 12/17/2017 10:29 AM EST   Summary: advice, sneezing and runny nose   Pt thinks she is getting a head cold. She noted symptoms of sneezing and running nose since yesterday. She is asking for advice on what to take for this.

## 2017-12-18 ENCOUNTER — Ambulatory Visit: Payer: Self-pay

## 2017-12-18 NOTE — Telephone Encounter (Signed)
Pt c/o left eye drainage, redness and eyelids red and painful. Symptoms began today. Pt has been wiping white discharge out the eye. Pt stated that the drainage blurs her vision but her vision clears after wiping the secretions. Pt has a runny nose and occasional cough. Care advice given and pt verbalized understanding. Appointment given to pt with Mackie Pai PA tomorrow.   Reason for Disposition . Eyelid is red and painful (or tender to touch)  Answer Assessment - Initial Assessment Questions 1. EYE DISCHARGE: "Is the discharge in one or both eyes?" "What color is it?" "How much is there?" "When did the discharge start?"      Left eye-white drainage-blurs her vision but vision clears after the drainage is wiped- yesterday 2. REDNESS OF SCLERA: "Is the redness in one or both eyes?" "When did the redness start?"      Yes left eye-today 3. EYELIDS: "Are the eyelids red or swollen?" If so, ask: "How much?"      A little bit 4. VISION: "Is there any difficulty seeing clearly?"      no 5. PAIN: "Is there any pain? If so, ask: "How bad is it?" (Scale 1-10; or mild, moderate, severe)    - MILD (1-3): doesn't interfere with normal activities     - MODERATE (4-7): interferes with normal activities or awakens from sleep    - SEVERE (8-10): excruciating pain, unable to do any normal activities     Mild pain to eye lids 6. CONTACT LENS: "Do you wear contacts?"     no 7. OTHER SYMPTOMS: "Do you have any other symptoms?" (e.g., fever, runny nose, cough)     Runny nose, occasional cough 8. PREGNANCY: "Is there any chance you are pregnant?" "When was your last menstrual period?"     n/a  Protocols used: EYE - PUS OR DISCHARGE-A-AH

## 2017-12-19 ENCOUNTER — Ambulatory Visit (INDEPENDENT_AMBULATORY_CARE_PROVIDER_SITE_OTHER): Payer: Medicare Other | Admitting: Medical

## 2017-12-19 ENCOUNTER — Encounter: Payer: Self-pay | Admitting: Medical

## 2017-12-19 VITALS — BP 155/70 | HR 62 | Temp 97.6°F | Resp 16 | Ht 59.0 in | Wt 97.5 lb

## 2017-12-19 DIAGNOSIS — R0981 Nasal congestion: Secondary | ICD-10-CM | POA: Diagnosis not present

## 2017-12-19 DIAGNOSIS — J069 Acute upper respiratory infection, unspecified: Secondary | ICD-10-CM | POA: Diagnosis not present

## 2017-12-19 DIAGNOSIS — H109 Unspecified conjunctivitis: Secondary | ICD-10-CM

## 2017-12-19 MED ORDER — TOBRAMYCIN 0.3 % OP SOLN: 2.0000 [drp] | Freq: Four times a day (QID) | OPHTHALMIC | 0 refills | Status: DC

## 2017-12-19 MED ORDER — FLUTICASONE PROPIONATE 50 MCG/ACT NA SUSP: 2.0000 | Freq: Every day | NASAL | 1 refills | Status: DC

## 2017-12-19 MED ORDER — CEPHALEXIN 500 MG PO CAPS: 500.0000 mg | ORAL_CAPSULE | Freq: Two times a day (BID) | ORAL | 0 refills | Status: DC

## 2017-12-19 MED ORDER — LEVOCETIRIZINE DIHYDROCHLORIDE 5 MG PO TABS: 2.5000 mg | ORAL_TABLET | Freq: Every evening | ORAL | 0 refills | Status: DC

## 2017-12-19 NOTE — Patient Instructions (Addendum)
You have had recent nasal congestion with mild eye itching and faint whitish discharge.   Appears probable early upper respiratory infection with possible viral conjunctivitis(versus bacterial).  I am prescribing Flonase nasal spray for congestion and Xyzal antihistamine.  If your eye discharge changes color or becomes worse such as having sticky/matting eyelids in the morning then go ahead and fill Tobrex antibiotic eye solution.(Provided as print prescription.)  Currently no obvious bacterial infection such as sinusitis, otitis media, bronchitis or pharyngitis.  We are approaching weekend and holiday week.  I am providing a Keflex print prescription in the event worsening signs symptoms as described.  BP did come down when I double checked blood pressure.  You can also check at home.  Make sure you are relaxed before checking.  Follow-up in 7 to 10 days or as needed.

## 2017-12-19 NOTE — Progress Notes (Signed)
Subjective:    Patient ID: [name] [name], female    DOB: 24-Aug-1924, 84 y.o.   MRN: [id number]  HPI Pt in some slight white dc bilateral  lower eye lid middle aspect x 1 day. Not aware of any close contacts with pPerson with conjunctivitis.  Pt states mild eye itching. No eye pain. No light flashes. No eye trauma. No spots in visual file.  Pt states some nasal congested late Monday afternoon with some sneezing.  Pt has taken corcidin bp.  Pt bp at home was 139/56 before she came to office. Here when MA checked by machine stated 181/59.   Review of Systems  Constitutional: Negative for chills, fatigue and fever.  HENT: Positive for congestion. Negative for sinus pressure and sinus pain.   Eyes: Positive for discharge and itching. Negative for photophobia and visual disturbance.       See hpi.  Respiratory: Negative for cough, chest tightness, shortness of breath and wheezing.   Cardiovascular: Negative for chest pain and palpitations.  Gastrointestinal: Negative for abdominal pain.  Musculoskeletal: Negative for back pain.  Neurological: Negative for dizziness, speech difficulty, weakness, numbness and headaches.  Hematological: Negative for adenopathy. Does not bruise/bleed easily.  Psychiatric/Behavioral: Negative for behavioral problems and confusion.   Past Medical History:  Diagnosis Date  . Anemia   . Anxiety   . Arthritis    hips, knees  . Atrial fibrillation ([place])   . Current use of long term anticoagulation   . Diverticulitis of colon 02/06/2015  . Diverticulosis   . Gait difficulty   . GERD (gastroesophageal reflux disease)   . Headache(784.0) 06/22/2012  . Hyperlipidemia   . Hypertension   . IBS (irritable bowel syndrome)   . Incontinence   . Loss of weight 06/05/2015  . Medicare annual wellness visit, subsequent 02/05/2014  . Melena   . Mitral and aortic regurgitation   . Mitral valve prolapse    hx of  . Osteopenia   . Pedal edema 10/01/2016  . Personal  history of colonic polyps   . Renal lithiasis 06/26/2015  . Thyroid disease    hypo  . Unspecified adverse effect of other drug, medicinal and biological substance(995.29)   . Vitamin B12 deficiency 03/16/2016  . Vitamin D deficiency      Social History   Socioeconomic History  . Marital status: Widowed    Spouse name: Not on file  . Number of children: 5  . Years of education: Not on file  . Highest education level: Not on file  Occupational History  . Occupation: retired    Fish farm manager: RETIRED  Social Needs  . Financial resource strain: Not on file  . Food insecurity:    Worry: Not on file    Inability: Not on file  . Transportation needs:    Medical: Not on file    Non-medical: Not on file  Tobacco Use  . Smoking status: Never Smoker  . Smokeless tobacco: Never Used  Substance and Sexual Activity  . Alcohol use: No  . Drug use: No  . Sexual activity: Not on file    Comment: lives with Daughter, grandson and his family. no dietary restricitons.  Lifestyle  . Physical activity:    Days per week: Not on file    Minutes per session: Not on file  . Stress: Not on file  Relationships  . Social connections:    Talks on phone: Not on file    Gets together: Not on file  Attends religious service: Not on file    Active member of club or organization: Not on file    Attends meetings of clubs or organizations: Not on file    Relationship status: Not on file  . Intimate partner violence:    Fear of current or ex partner: Not on file    Emotionally abused: Not on file    Physically abused: Not on file    Forced sexual activity: Not on file  Other Topics Concern  . Not on file  Social History Narrative  . Not on file    Past Surgical History:  Procedure Laterality Date  . BOTOX INJECTION N/A 09/03/2012   Procedure: BOTOX INJECTION;  Surgeon: Inda Castle, MD;  Location: [facility];  Service: Endoscopy;  Laterality: N/A;  . BREAST BIOPSY Left   . BREAST LUMPECTOMY  Right 08/18/2014   Procedure: RIGHT BREAST LUMPECTOMY;  Surgeon: Coralie Keens, MD;  Location: [place];  Service: General;  Laterality: Right;  . CARDIAC ELECTROPHYSIOLOGY MAPPING AND ABLATION    . CATARACT EXTRACTION, BILATERAL    . ESOPHAGOGASTRODUODENOSCOPY N/A 09/03/2012   Procedure: ESOPHAGOGASTRODUODENOSCOPY (EGD);  Surgeon: Inda Castle, MD;  Location: [facility];  Service: Endoscopy;  Laterality: N/A;  . ESOPHAGOSCOPY W/ BOTOX INJECTION    . I&D EXTREMITY Right 11/06/2012   Procedure: IRRIGATION AND DEBRIDEMENT EXTREMITY Right Ring Finger;  Surgeon: Tennis Must, MD;  Location: [place];  Service: Orthopedics;  Laterality: Right;  . PARTIAL HYSTERECTOMY     ovaries left in place    Family History  Problem Relation Age of Onset  . Diabetes Mother   . CVA Mother   . Stroke Mother   . COPD Father   . Rheum arthritis Father   . Allergies Daughter   . COPD Daughter   . Stroke Daughter   . COPD Daughter        previous smoker  . Stroke Maternal Grandfather   . Heart disease Maternal Aunt   . Heart disease Maternal Uncle   . Colon cancer Neg Hx   . Colon polyps Neg Hx   . Esophageal cancer Neg Hx   . Gallbladder disease Neg Hx   . Kidney disease Neg Hx   . Heart attack Neg Hx   . Hypertension Neg Hx     Allergies  Allergen Reactions  . Amlodipine Shortness Of Breath  . Darifenacin Hydrobromide     REACTION: causes her dizziness  . Sulfonamide Derivatives Other (See Comments)    Pt thinks it caused dizziness  . Tramadol Other (See Comments)    Insomnia, anorexia    Current Outpatient Medications on File Prior to Visit  Medication Sig Dispense Refill  . ALPRAZolam (XANAX) 0.25 MG tablet Take 0.5-1 tablets (0.125-0.25 mg total) by mouth 2 (two) times daily as needed for anxiety. 30 tablet 2  . azelastine (ASTELIN) 0.1 % nasal spray Place 2 sprays into both nostrils 2 (two) times daily. Use in each nostril as directed 30 mL 5  . Cholecalciferol  (VITAMIN D) 2000 UNITS CAPS Take 2,000 Units by mouth every other day.    . Cyanocobalamin (VITAMIN B-12) 500 MCG SUBL Place 1 tablet (500 mcg total) under the tongue daily at 2 PM. 150 tablet   . famotidine (PEPCID) 20 MG tablet Take 1 tablet (20 mg total) by mouth every evening. 30 tablet 3  . hydrALAZINE (APRESOLINE) 10 MG tablet TAKE 1 TABLET BY MOUTH THREE TIMES A DAY 90 tablet 0  .  metoprolol succinate (TOPROL-XL) 50 MG 24 hr tablet Take 1 tablet (50 mg total) by mouth daily. Take 1/2 tablet 2 times daily with meals.. 90 tablet 3  . pantoprazole (PROTONIX) 40 MG tablet TAKE 1 TABLET (40 MG TOTAL) BY MOUTH DAILY. 90 tablet 1  . ramipril (ALTACE) 10 MG capsule TAKE 1 CAPSULE (10 MG TOTAL) BY MOUTH 2 (TWO) TIMES DAILY. 180 capsule 0  . warfarin (COUMADIN) 2.5 MG tablet TAKE AS DIRECTED BY ANTICOAGULATION CLINIC. A 30 DAY SUPPLY 120 tablet 1   No current facility-administered medications on file prior to visit.     BP (!) 155/70   Pulse 62   Temp 97.6 F (36.4 C) (Oral)   Resp 16   Ht 4\' 11"  (1.499 m)   Wt 97 lb 8 oz (44.2 kg)   SpO2 99%   BMI 19.69 kg/m       Objective:   Physical Exam  General  Mental Status - Alert. General Appearance - Well groomed. Not in acute distress.  Eyes- conjunctiva clear, no dc. No swollen lids. EOM intact.  Skin Rashes- No Rashes.  HEENT Head- Normal. Ear Auditory Canal - Left- Normal. Right - Normal.Tympanic Membrane- Left- Normal. Right- Normal. Eye Sclera/Conjunctiva- Left- Normal. Right- Normal. Nose & Sinuses Nasal Mucosa- Left-  Boggy and Congested. Right-  Boggy and  Congested.Bilateral no  maxillary and no  frontal sinus pressure. Mouth & Throat Lips: Upper Lip- Normal: no dryness, cracking, pallor, cyanosis, or vesicular eruption. Lower Lip-Normal: no dryness, cracking, pallor, cyanosis or vesicular eruption. Buccal Mucosa- Bilateral- No Aphthous ulcers. Oropharynx- No Discharge or Erythema. Tonsils: Characteristics- Bilateral-  No Erythema or Congestion. Size/Enlargement- Bilateral- No enlargement. Discharge- bilateral-None.  Neck Neck- Supple. No Masses.   Chest and Lung Exam Auscultation: Breath Sounds:-Clear even and unlabored.  Cardiovascular Auscultation:Rythm- Regular, rate and rhythm. Murmurs & Other Heart Sounds:Ausculatation of the heart reveal- No Murmurs.  Lymphatic Head & Neck General Head & Neck Lymphatics: Bilateral: Description- No Localized lymphadenopathy.       Assessment & Plan:  You have had recent nasal congestion with mild eye itching and faint whitish discharge.   Appears probable early upper respiratory infection with possible viral conjunctivitis(versus bacterial).  I am prescribing Flonase nasal spray for congestion and Xyzal antihistamine.  If your eye discharge changes color or becomes worse such as having sticky/matting eyelids in the morning then go ahead and fill Tobrex antibiotic eye solution.(Provided as print prescription.)  Currently no obvious bacterial infection such as sinusitis, otitis media, bronchitis or pharyngitis.  We are approaching weekend and holiday week.  I am providing a Keflex print prescription in the event worsening signs symptoms as described.  BP did come down when I double checked blood pressure.  You can also check at home.  Make sure you are relaxed before checking.  Follow-up in 7 to 10 days or as needed.  Mackie Pai, PA-C

## 2017-12-19 NOTE — Progress Notes (Signed)
Pre visit review using our clinic review tool, if applicable. No additional management support is needed unless otherwise documented below in the visit note. 

## 2017-12-20 ENCOUNTER — Ambulatory Visit: Payer: Medicare Other | Admitting: Pharmacist

## 2017-12-20 DIAGNOSIS — Z5181 Encounter for therapeutic drug level monitoring: Secondary | ICD-10-CM | POA: Diagnosis not present

## 2017-12-20 DIAGNOSIS — I4891 Unspecified atrial fibrillation: Secondary | ICD-10-CM

## 2017-12-20 LAB — POCT INR: INR: 2.3 (ref 2.0–3.0)

## 2017-12-20 NOTE — Patient Instructions (Signed)
Description   Continue on same dosage 1 tablet daily except 1.5 tablets on Mondays and Fridays.  Recheck INR in 6 weeks. Call our office if you are placed on any new medications or if you have any concerns [phone].

## 2018-01-01 ENCOUNTER — Other Ambulatory Visit: Payer: Self-pay | Admitting: Cardiology

## 2018-01-08 ENCOUNTER — Other Ambulatory Visit: Payer: Self-pay | Admitting: Family Medicine

## 2018-01-08 DIAGNOSIS — I10 Essential (primary) hypertension: Secondary | ICD-10-CM

## 2018-01-10 ENCOUNTER — Other Ambulatory Visit: Payer: Self-pay | Admitting: Medical

## 2018-01-24 ENCOUNTER — Ambulatory Visit: Payer: Medicare Other | Admitting: Family Medicine

## 2018-01-31 ENCOUNTER — Ambulatory Visit: Payer: Medicare Other | Admitting: Pharmacist

## 2018-01-31 DIAGNOSIS — Z5181 Encounter for therapeutic drug level monitoring: Secondary | ICD-10-CM

## 2018-01-31 DIAGNOSIS — I4891 Unspecified atrial fibrillation: Secondary | ICD-10-CM

## 2018-01-31 LAB — POCT INR: INR: 1.9 — AB (ref 2.0–3.0)

## 2018-01-31 NOTE — Patient Instructions (Signed)
Take 2 tablets tonight, then continue on same dosage 1 tablet daily except 1.5 tablets on Mondays and Fridays.  Recheck INR in 6 weeks. Call our office if you are placed on any new medications or if you have any concerns [phone].

## 2018-02-05 ENCOUNTER — Other Ambulatory Visit: Payer: Self-pay | Admitting: Family Medicine

## 2018-02-05 DIAGNOSIS — I10 Essential (primary) hypertension: Secondary | ICD-10-CM

## 2018-02-07 ENCOUNTER — Ambulatory Visit: Payer: Medicare Other | Admitting: Cardiology

## 2018-02-07 ENCOUNTER — Encounter: Payer: Self-pay | Admitting: Cardiology

## 2018-02-07 VITALS — BP 148/64 | HR 54 | Ht 59.0 in | Wt 97.8 lb

## 2018-02-07 DIAGNOSIS — I4811 Longstanding persistent atrial fibrillation: Secondary | ICD-10-CM

## 2018-02-07 DIAGNOSIS — I10 Essential (primary) hypertension: Secondary | ICD-10-CM

## 2018-02-07 DIAGNOSIS — Z7901 Long term (current) use of anticoagulants: Secondary | ICD-10-CM | POA: Diagnosis not present

## 2018-02-07 DIAGNOSIS — I272 Pulmonary hypertension, unspecified: Secondary | ICD-10-CM

## 2018-02-07 NOTE — Progress Notes (Signed)
Cardiology Office Note:    Date:  02/07/2018   ID:  [name] [name], DOB November 21, 1924, MRN [id number]  PCP:  Mosie Lukes, MD  Cardiologist:  Ena Dawley, MD  Referring MD: Mosie Lukes, MD   Chief Complaint  Patient presents with  . Follow-up    Atrial fibrillation    History of Present Illness:    [name] [name] is a 85 y.o. female with a past medical history significant for persistent atrial fibrillation on Coumadin as well as atrial flutter at some point, essential hypertension, sinus bradycardia, hyperlipidemia, anemia, anxiety, arthritis, diverticulosis, GERD, thyroid disease and low body weight.  The patient was managed with rate control in the past and felt to be in chronic A. fib however EKGs in 2018 and 2019 showed sinus bradycardia.  She was seen in the ER 06/20/17 with discomfort in chest, upper abdomen and back. CT angio chest/abd/pelvis showed no acute dissection/aneurysm, + atherosclerotic vascular disease with suspected renal artery stenosis, possible 16 mm hyperenhancing splenic mass, sigmoid diverticular disease -> OP w/u of splenic mass recommended.  [name] [name] was hospitalized in late June last year with weakness and decreased appetite found to be hyponatremic and slightly bradycardic.  She was treated for possible SIADH.  Initially the patient was bradycardic and her beta-blocker was held but heart rate increased to greater than 100.  She was seen in consultation by Dr. Marlou Porch who felt that her rhythm was actually atrial flutter appearing.  He advised continued rate control on low-dose Toprol and watch for bradycardia.  [name] [name] is here today for her 66-month follow-up with her daughter. She denies any new issues. She has no chest pain or shortness of breath. She has an occasional fast heart beat, mostly at night, lasting for up to a couple of hours.  She has no associated chest discomfort, shortness of breath or lightheadedness. It occurs less than once a week.   She has had no orthopnea, PND or edema.  No lightheadedness.  She lives with her daughter and is mostly able to do what she wants. She does housework and Medical sales representative, cooking, goes [employer]. Tolerates activity with only mild fatigue.  She said she is getting around more slowly than she used to but she is 85 years old.  Past Medical History:  Diagnosis Date  . Anemia   . Anxiety   . Arthritis    hips, knees  . Atrial fibrillation ([place])   . Current use of long term anticoagulation   . Diverticulitis of colon 02/06/2015  . Diverticulosis   . Gait difficulty   . GERD (gastroesophageal reflux disease)   . Headache(784.0) 06/22/2012  . Hyperlipidemia   . Hypertension   . IBS (irritable bowel syndrome)   . Incontinence   . Loss of weight 06/05/2015  . Medicare annual wellness visit, subsequent 02/05/2014  . Melena   . Mitral and aortic regurgitation   . Mitral valve prolapse    hx of  . Osteopenia   . Pedal edema 10/01/2016  . Personal history of colonic polyps   . Renal lithiasis 06/26/2015  . Thyroid disease    hypo  . Unspecified adverse effect of other drug, medicinal and biological substance(995.29)   . Vitamin B12 deficiency 03/16/2016  . Vitamin D deficiency     Past Surgical History:  Procedure Laterality Date  . BOTOX INJECTION N/A 09/03/2012   Procedure: BOTOX INJECTION;  Surgeon: Inda Castle, MD;  Location: [facility];  Service: Endoscopy;  Laterality: N/A;  . BREAST BIOPSY Left   . BREAST LUMPECTOMY Right 08/18/2014   Procedure: RIGHT BREAST LUMPECTOMY;  Surgeon: Coralie Keens, MD;  Location: [place];  Service: General;  Laterality: Right;  . CARDIAC ELECTROPHYSIOLOGY MAPPING AND ABLATION    . CATARACT EXTRACTION, BILATERAL    . ESOPHAGOGASTRODUODENOSCOPY N/A 09/03/2012   Procedure: ESOPHAGOGASTRODUODENOSCOPY (EGD);  Surgeon: Inda Castle, MD;  Location: [facility];  Service: Endoscopy;  Laterality: N/A;  . ESOPHAGOSCOPY W/ BOTOX INJECTION      . I&D EXTREMITY Right 11/06/2012   Procedure: IRRIGATION AND DEBRIDEMENT EXTREMITY Right Ring Finger;  Surgeon: Tennis Must, MD;  Location: [place];  Service: Orthopedics;  Laterality: Right;  . PARTIAL HYSTERECTOMY     ovaries left in place    Current Medications: Current Meds  Medication Sig  . ALPRAZolam (XANAX) 0.25 MG tablet Take 0.5-1 tablets (0.125-0.25 mg total) by mouth 2 (two) times daily as needed for anxiety.  Marland Kitchen azelastine (ASTELIN) 0.1 % nasal spray Place 2 sprays into both nostrils 2 (two) times daily. Use in each nostril as directed  . Cholecalciferol (VITAMIN D) 2000 UNITS CAPS Take 2,000 Units by mouth every other day.  . Cyanocobalamin (VITAMIN B-12) 500 MCG SUBL Place 1 tablet (500 mcg total) under the tongue daily at 2 PM.  . famotidine (PEPCID) 20 MG tablet Take 1 tablet (20 mg total) by mouth every evening.  . fluticasone (FLONASE) 50 MCG/ACT nasal spray Place 2 sprays into both nostrils daily.  . hydrALAZINE (APRESOLINE) 10 MG tablet TAKE 1 TABLET BY MOUTH THREE TIMES A DAY  . metoprolol succinate (TOPROL-XL) 50 MG 24 hr tablet Take 25 mg by mouth 2 (two) times daily. Take with or immediately following a meal.  . pantoprazole (PROTONIX) 40 MG tablet TAKE 1 TABLET (40 MG TOTAL) BY MOUTH DAILY.  . ramipril (ALTACE) 10 MG capsule TAKE 1 CAPSULE (10 MG TOTAL) BY MOUTH 2 (TWO) TIMES DAILY.  Marland Kitchen tobramycin (TOBREX) 0.3 % ophthalmic solution Place 2 drops into both eyes every 6 (six) hours.  Marland Kitchen warfarin (COUMADIN) 2.5 MG tablet TAKE AS DIRECTED BY ANTICOAGULATION CLINIC.     Allergies:   Amlodipine; Darifenacin hydrobromide; Sulfonamide derivatives; and Tramadol   Social History   Socioeconomic History  . Marital status: Widowed    Spouse name: Not on file  . Number of children: 5  . Years of education: Not on file  . Highest education level: Not on file  Occupational History  . Occupation: retired    Fish farm manager: RETIRED  Social Needs  . Financial resource strain: Not  on file  . Food insecurity:    Worry: Not on file    Inability: Not on file  . Transportation needs:    Medical: Not on file    Non-medical: Not on file  Tobacco Use  . Smoking status: Never Smoker  . Smokeless tobacco: Never Used  Substance and Sexual Activity  . Alcohol use: No  . Drug use: No  . Sexual activity: Not on file    Comment: lives with Daughter, grandson and his family. no dietary restricitons.  Lifestyle  . Physical activity:    Days per week: Not on file    Minutes per session: Not on file  . Stress: Not on file  Relationships  . Social connections:    Talks on phone: Not on file    Gets together: Not on file    Attends religious service: Not on file  Active member of club or organization: Not on file    Attends meetings of clubs or organizations: Not on file    Relationship status: Not on file  Other Topics Concern  . Not on file  Social History Narrative  . Not on file     Family History: The patient's family history includes Allergies in her daughter; COPD in her daughter, daughter, and father; CVA in her mother; Diabetes in her mother; Heart disease in her maternal aunt and maternal uncle; Rheum arthritis in her father; Stroke in her daughter, maternal grandfather, and mother. There is no history of Colon cancer, Colon polyps, Esophageal cancer, Gallbladder disease, Kidney disease, Heart attack, or Hypertension. ROS:   Please see the history of present illness.     All other systems reviewed and are negative.  EKGs/Labs/Other Studies Reviewed:    The following studies were reviewed today:  Echocardiogram 12/10/2016 Study Conclusions - Left ventricle: The cavity size was normal. There was mild   concentric hypertrophy. Systolic function was normal. The   estimated ejection fraction was in the range of 55% to 60%. Wall   motion was normal; there were no regional wall motion   abnormalities. The study is not technically sufficient to allow    evaluation of LV diastolic function. - Aortic valve: Transvalvular velocity was within the normal range.   There was no stenosis. There was no regurgitation. - Mitral valve: Transvalvular velocity was within the normal range.   There was no evidence for stenosis. There was mild regurgitation. - Left atrium: The atrium was moderately dilated. - Right ventricle: Systolic function was normal. - Atrial septum: No defect or patent foramen ovale was identified. - Tricuspid valve: There was mild regurgitation. - Pulmonary arteries: Systolic pressure was within the normal   range. PA peak pressure: 36 mm Hg (S).   EKG:  EKG is not ordered today.  Recent Labs: 07/01/2017: TSH 2.911 07/02/2017: Magnesium 2.0 08/30/2017: ALT 8; BUN 15; Creatinine, Ser 0.92; Hemoglobin 11.2; Platelets 207.0; Potassium 4.1; Sodium 139   Recent Lipid Panel    Component Value Date/Time   CHOL 182 05/09/2017 1310   TRIG 171.0 (H) 05/09/2017 1310   HDL 39.00 (L) 05/09/2017 1310   CHOLHDL 5 05/09/2017 1310   VLDL 34.2 05/09/2017 1310   LDLCALC 109 (H) 05/09/2017 1310   LDLDIRECT 72.1 01/19/2009 1026    Physical Exam:    VS:  BP (!) 148/64   Pulse (!) 54   Ht 4\' 11"  (1.499 m)   Wt 97 lb 12.8 oz (44.4 kg)   SpO2 98%   BMI 19.75 kg/m     Wt Readings from Last 3 Encounters:  02/07/18 97 lb 12.8 oz (44.4 kg)  12/19/17 97 lb 8 oz (44.2 kg)  10/18/17 94 lb 9.6 oz (42.9 kg)     Physical Exam  Constitutional: She is oriented to person, place, and time. No distress.  Frail elderly female  HENT:  Head: Normocephalic and atraumatic.  Neck: Normal range of motion. Neck supple. No JVD present.  Cardiovascular: Normal rate and normal heart sounds. An irregularly irregular rhythm present. Exam reveals no gallop and no friction rub.  No murmur heard. Pulmonary/Chest: Effort normal and breath sounds normal. No respiratory distress. She has no wheezes. She has no rales.  Abdominal: Soft. Bowel sounds are normal.    Musculoskeletal:        General: No edema.     Comments: Kyphosis and arthritic changes of the hands  Neurological:  She is alert and oriented to person, place, and time.  Skin: Skin is warm and dry.  Psychiatric: She has a normal mood and affect. Her behavior is normal. Judgment and thought content normal.  Vitals reviewed.   ASSESSMENT:    1. Longstanding persistent atrial fibrillation   2. Essential hypertension   3. Pulmonary hypertension ([place])   4. Chronic anticoagulation    PLAN:    In order of problems listed above:  Persistent atrial fib/flutter -Rate controlled with Toprol-XL. Pt has occ fast heart beat with no associated symptoms, occurring less than once a week and not particularly bothering her. Unable to increase BB for better rate control due to baseline bradycardia. Will continue current therapy for now. Pt instructed to let us know if her episodes become prolonged or she develops associated symptoms.  -She is maintained on Coumadin for CHADSVASC 4. I discussed possible switch to Eliquis (would be reduced dose due to age and wt) as this would eliminate the blood monitoring and have slightly reduced bleeding risk. She does not want to pursue it today but will think about it. She is concerned that the cost may be higher.   Hypertension -Management with hydralazine 10 mg 3 times daily, Toprol XL, Altace 10 mg twice daily -BP adequately controlled given her age.  -Will update labs.  Mild pulmonary hypertension -Unchanged over several years and asymptomatic.   Medication Adjustments/Labs and Tests Ordered: Current medicines are reviewed at length with the patient today.  Concerns regarding medicines are outlined above. Labs and tests ordered and medication changes are outlined in the patient instructions below:  Patient Instructions  Medication Instructions:  Your physician recommends that you continue on your current medications as directed. Please refer to the Current  Medication list given to you today.  If you need a refill on your cardiac medications before your next appointment, please call your pharmacy.   Lab work: TODAY: BMET & CBC  If you have labs (blood work) drawn today and your tests are completely normal, you will receive your results only by: Marland Kitchen MyChart Message (if you have MyChart) OR . A paper copy in the mail If you have any lab test that is abnormal or we need to change your treatment, we will call you to review the results.  Testing/Procedures: None  Follow-Up: At [facility], you and your health needs are our priority.  As part of our continuing mission to provide you with exceptional heart care, we have created designated Provider Care Teams.  These Care Teams include your primary Cardiologist (physician) and Advanced Practice Providers (APPs -  Physician Assistants and Nurse Practitioners) who all work together to provide you with the care you need, when you need it. You will need a follow up appointment in 6 months.  Please call our office 2 months in advance to schedule this appointment.  You may see Ena Dawley, MD or one of the following Advanced Practice Providers on your designated Care Team:   [place], PA-C Melina Copa, PA-C . [name], PA-C  Any Other Special Instructions Will Be Listed Below (If Applicable).       Signed, Daune Perch, NP  02/07/2018 2:05 PM    [facility]

## 2018-02-07 NOTE — Patient Instructions (Signed)
Medication Instructions:  Your physician recommends that you continue on your current medications as directed. Please refer to the Current Medication list given to you today.  If you need a refill on your cardiac medications before your next appointment, please call your pharmacy.   Lab work: TODAY: BMET & CBC  If you have labs (blood work) drawn today and your tests are completely normal, you will receive your results only by: Marland Kitchen MyChart Message (if you have MyChart) OR . A paper copy in the mail If you have any lab test that is abnormal or we need to change your treatment, we will call you to review the results.  Testing/Procedures: None  Follow-Up: At [facility], you and your health needs are our priority.  As part of our continuing mission to provide you with exceptional heart care, we have created designated Provider Care Teams.  These Care Teams include your primary Cardiologist (physician) and Advanced Practice Providers (APPs -  Physician Assistants and Nurse Practitioners) who all work together to provide you with the care you need, when you need it. You will need a follow up appointment in 6 months.  Please call our office 2 months in advance to schedule this appointment.  You may see Ena Dawley, MD or one of the following Advanced Practice Providers on your designated Care Team:   [place], PA-C Melina Copa, PA-C . [name], PA-C  Any Other Special Instructions Will Be Listed Below (If Applicable).

## 2018-02-08 ENCOUNTER — Other Ambulatory Visit: Payer: Self-pay | Admitting: Medical

## 2018-02-08 LAB — BASIC METABOLIC PANEL
BUN/Creatinine Ratio: 16 (ref 12–28)
BUN: 17 mg/dL (ref 10–36)
CO2: 26 mmol/L (ref 20–29)
Calcium: 9 mg/dL (ref 8.7–10.3)
Chloride: 99 mmol/L (ref 96–106)
Creatinine, Ser: 1.07 mg/dL — ABNORMAL HIGH (ref 0.57–1.00)
GFR calc Af Amer: 52 mL/min/{1.73_m2} — ABNORMAL LOW (ref >59)
GFR calc non Af Amer: 45 mL/min/{1.73_m2} — ABNORMAL LOW (ref >59)
Glucose: 109 mg/dL — ABNORMAL HIGH (ref 65–99)
Potassium: 4.7 mmol/L (ref 3.5–5.2)
Sodium: 139 mmol/L (ref 134–144)

## 2018-02-08 LAB — CBC
Hematocrit: 34.4 % (ref 34.0–46.6)
Hemoglobin: 11.4 g/dL (ref 11.1–15.9)
MCH: 30.8 pg (ref 26.6–33.0)
MCHC: 33.1 g/dL (ref 31.5–35.7)
MCV: 93 fL (ref 79–97)
Platelets: 217 10*3/uL (ref 150–450)
RBC: 3.7 x10E6/uL — ABNORMAL LOW (ref 3.77–5.28)
RDW: 14 % (ref 11.7–15.4)
WBC: 8.1 10*3/uL (ref 3.4–10.8)

## 2018-02-14 ENCOUNTER — Ambulatory Visit: Payer: Medicare Other | Admitting: Family Medicine

## 2018-02-21 ENCOUNTER — Ambulatory Visit (INDEPENDENT_AMBULATORY_CARE_PROVIDER_SITE_OTHER): Payer: Medicare Other | Admitting: Family Medicine

## 2018-02-21 VITALS — BP 136/76 | HR 78 | Temp 97.9°F | Resp 18 | Wt 98.4 lb

## 2018-02-21 DIAGNOSIS — G4700 Insomnia, unspecified: Secondary | ICD-10-CM | POA: Diagnosis not present

## 2018-02-21 DIAGNOSIS — K5900 Constipation, unspecified: Secondary | ICD-10-CM | POA: Diagnosis not present

## 2018-02-21 DIAGNOSIS — F411 Generalized anxiety disorder: Secondary | ICD-10-CM

## 2018-02-21 DIAGNOSIS — E559 Vitamin D deficiency, unspecified: Secondary | ICD-10-CM

## 2018-02-21 DIAGNOSIS — N289 Disorder of kidney and ureter, unspecified: Secondary | ICD-10-CM

## 2018-02-21 DIAGNOSIS — I10 Essential (primary) hypertension: Secondary | ICD-10-CM

## 2018-02-21 DIAGNOSIS — R739 Hyperglycemia, unspecified: Secondary | ICD-10-CM | POA: Diagnosis not present

## 2018-02-21 DIAGNOSIS — K219 Gastro-esophageal reflux disease without esophagitis: Secondary | ICD-10-CM

## 2018-02-21 LAB — COMPREHENSIVE METABOLIC PANEL
ALT: 11 U/L (ref 0–35)
AST: 16 U/L (ref 0–37)
Albumin: 3.9 g/dL (ref 3.5–5.2)
Alkaline Phosphatase: 15 U/L — ABNORMAL LOW (ref 39–117)
BILIRUBIN TOTAL: 0.4 mg/dL (ref 0.2–1.2)
BUN: 16 mg/dL (ref 6–23)
CO2: 32 mEq/L (ref 19–32)
Calcium: 9.3 mg/dL (ref 8.4–10.5)
Chloride: 102 mEq/L (ref 96–112)
Creatinine, Ser: 0.96 mg/dL (ref 0.40–1.20)
GFR: 54.34 mL/min — ABNORMAL LOW (ref >60.00)
Glucose, Bld: 90 mg/dL (ref 70–99)
Potassium: 4.2 mEq/L (ref 3.5–5.1)
Sodium: 141 mEq/L (ref 135–145)
Total Protein: 6.7 g/dL (ref 6.0–8.3)

## 2018-02-21 LAB — TSH: TSH: 3.84 u[IU]/mL (ref 0.35–4.50)

## 2018-02-21 LAB — HEMOGLOBIN A1C: Hgb A1c MFr Bld: 5.6 % (ref 4.6–6.5)

## 2018-02-21 LAB — VITAMIN D 25 HYDROXY (VIT D DEFICIENCY, FRACTURES): VITD: 44.82 ng/mL (ref 30.00–100.00)

## 2018-02-21 LAB — MAGNESIUM: Magnesium: 1.8 mg/dL (ref 1.5–2.5)

## 2018-02-21 MED ORDER — ALPRAZOLAM 0.25 MG PO TABS: 0.1250 mg | ORAL_TABLET | Freq: Two times a day (BID) | ORAL | 2 refills | Status: DC | PRN

## 2018-02-21 MED ORDER — PANTOPRAZOLE SODIUM 40 MG PO TBEC: DELAYED_RELEASE_TABLET | ORAL | 1 refills | Status: DC

## 2018-02-21 NOTE — Patient Instructions (Addendum)
Warm milk and cookies/Toast/cracker this makes L tyrptophan which helps sleep LTryptophan capsules or tablets can also help sleep  Beneifiber can be increased to twice daily and very important to get some water after taking it If that does not work to soften up the stool then consider adding back one stool softener a day  Consider bone density test to check on your bones  Insomnia Insomnia is a sleep disorder that makes it difficult to fall asleep or stay asleep. Insomnia can cause fatigue, low energy, difficulty concentrating, mood swings, and poor performance at work or school. There are three different ways to classify insomnia:  Difficulty falling asleep.  Difficulty staying asleep.  Waking up too early in the morning. Any type of insomnia can be long-term (chronic) or short-term (acute). Both are common. Short-term insomnia usually lasts for three months or less. Chronic insomnia occurs at least three times a week for longer than three months. What are the causes? Insomnia may be caused by another condition, situation, or substance, such as:  Anxiety.  Certain medicines.  Gastroesophageal reflux disease (GERD) or other gastrointestinal conditions.  Asthma or other breathing conditions.  Restless legs syndrome, sleep apnea, or other sleep disorders.  Chronic pain.  Menopause.  Stroke.  Abuse of alcohol, tobacco, or illegal drugs.  Mental health conditions, such as depression.  Caffeine.  Neurological disorders, such as Alzheimer's disease.  An overactive thyroid (hyperthyroidism). Sometimes, the cause of insomnia may not be known. What increases the risk? Risk factors for insomnia include:  Gender. Women are affected more often than men.  Age. Insomnia is more common as you get older.  Stress.  Lack of exercise.  Irregular work schedule or working night shifts.  Traveling between different time zones.  Certain medical and mental health  conditions. What are the signs or symptoms? If you have insomnia, the main symptom is having trouble falling asleep or having trouble staying asleep. This may lead to other symptoms, such as:  Feeling fatigued or having low energy.  Feeling nervous about going to sleep.  Not feeling rested in the morning.  Having trouble concentrating.  Feeling irritable, anxious, or depressed. How is this diagnosed? This condition may be diagnosed based on:  Your symptoms and medical history. Your health care provider may ask about: ? Your sleep habits. ? Any medical conditions you have. ? Your mental health.  A physical exam. How is this treated? Treatment for insomnia depends on the cause. Treatment may focus on treating an underlying condition that is causing insomnia. Treatment may also include:  Medicines to help you sleep.  Counseling or therapy.  Lifestyle adjustments to help you sleep better. Follow these instructions at home: Eating and drinking   Limit or avoid alcohol, caffeinated beverages, and cigarettes, especially close to bedtime. These can disrupt your sleep.  Do not eat a large meal or eat spicy foods right before bedtime. This can lead to digestive discomfort that can make it hard for you to sleep. Sleep habits   Keep a sleep diary to help you and your health care provider figure out what could be causing your insomnia. Write down: ? When you sleep. ? When you wake up during the night. ? How well you sleep. ? How rested you feel the next day. ? Any side effects of medicines you are taking. ? What you eat and drink.  Make your bedroom a dark, comfortable place where it is easy to fall asleep. ? Put up shades or blackout  curtains to block light from outside. ? Use a white noise machine to block noise. ? Keep the temperature cool.  Limit screen use before bedtime. This includes: ? Watching TV. ? Using your smartphone, tablet, or computer.  Stick to a routine  that includes going to bed and waking up at the same times every day and night. This can help you fall asleep faster. Consider making a quiet activity, such as reading, part of your nighttime routine.  Try to avoid taking naps during the day so that you sleep better at night.  Get out of bed if you are still awake after 15 minutes of trying to sleep. Keep the lights down, but try reading or doing a quiet activity. When you feel sleepy, go back to bed. General instructions  Take over-the-counter and prescription medicines only as told by your health care provider.  Exercise regularly, as told by your health care provider. Avoid exercise starting several hours before bedtime.  Use relaxation techniques to manage stress. Ask your health care provider to suggest some techniques that may work well for you. These may include: ? Breathing exercises. ? Routines to release muscle tension. ? Visualizing peaceful scenes.  Make sure that you drive carefully. Avoid driving if you feel very sleepy.  Keep all follow-up visits as told by your health care provider. This is important. Contact a health care provider if:  You are tired throughout the day.  You have trouble in your daily routine due to sleepiness.  You continue to have sleep problems, or your sleep problems get worse. Get help right away if:  You have serious thoughts about hurting yourself or someone else. If you ever feel like you may hurt yourself or others, or have thoughts about taking your own life, get help right away. You can go to your nearest emergency department or call:  Your local emergency services (911 in the [place]  A suicide crisis helpline, such as the [facility] at (703)072-4766. This is open 24 hours a day. Summary  Insomnia is a sleep disorder that makes it difficult to fall asleep or stay asleep.  Insomnia can be long-term (chronic) or short-term (acute).  Treatment for insomnia  depends on the cause. Treatment may focus on treating an underlying condition that is causing insomnia.  Keep a sleep diary to help you and your health care provider figure out what could be causing your insomnia. This information is not intended to replace advice given to you by your health care provider. Make sure you discuss any questions you have with your health care provider. Document Released: 12/16/1999 Document Revised: 09/27/2016 Document Reviewed: 09/27/2016 Elsevier Interactive Patient Education  2019 [employer].

## 2018-02-23 NOTE — Assessment & Plan Note (Signed)
Uses the Alprazolam sparingly without concerning side effects. Refills alllowed

## 2018-02-23 NOTE — Assessment & Plan Note (Signed)
Hydrate and monitor 

## 2018-02-23 NOTE — Progress Notes (Signed)
Subjective:    Patient ID: [name] [name], female    DOB: 01/23/1924, 85 y.o.   MRN: [id number]  No chief complaint on file.   HPI Patient is in today for follow up. She mostly feels well. No recent febrile illness or hospitalizations. Does endorse fatigue but reports a good appetite. Denies CP/palp/SOB/HA/congestion/fevers/GI or GU c/o. Taking meds as prescribed  Past Medical History:  Diagnosis Date  . Anemia   . Anxiety   . Arthritis    hips, knees  . Atrial fibrillation ([place])   . Current use of long term anticoagulation   . Diverticulitis of colon 02/06/2015  . Diverticulosis   . Gait difficulty   . GERD (gastroesophageal reflux disease)   . Headache(784.0) 06/22/2012  . Hyperlipidemia   . Hypertension   . IBS (irritable bowel syndrome)   . Incontinence   . Loss of weight 06/05/2015  . Medicare annual wellness visit, subsequent 02/05/2014  . Melena   . Mitral and aortic regurgitation   . Mitral valve prolapse    hx of  . Osteopenia   . Pedal edema 10/01/2016  . Personal history of colonic polyps   . Renal lithiasis 06/26/2015  . Thyroid disease    hypo  . Unspecified adverse effect of other drug, medicinal and biological substance(995.29)   . Vitamin B12 deficiency 03/16/2016  . Vitamin D deficiency     Past Surgical History:  Procedure Laterality Date  . BOTOX INJECTION N/A 09/03/2012   Procedure: BOTOX INJECTION;  Surgeon: Inda Castle, MD;  Location: [facility];  Service: Endoscopy;  Laterality: N/A;  . BREAST BIOPSY Left   . BREAST LUMPECTOMY Right 08/18/2014   Procedure: RIGHT BREAST LUMPECTOMY;  Surgeon: Coralie Keens, MD;  Location: [facility];  Service: General;  Laterality: Right;  . CARDIAC ELECTROPHYSIOLOGY MAPPING AND ABLATION    . CATARACT EXTRACTION, BILATERAL    . ESOPHAGOGASTRODUODENOSCOPY N/A 09/03/2012   Procedure: ESOPHAGOGASTRODUODENOSCOPY (EGD);  Surgeon: Inda Castle, MD;  Location: [facility];  Service: Endoscopy;   Laterality: N/A;  . ESOPHAGOSCOPY W/ BOTOX INJECTION    . I&D EXTREMITY Right 11/06/2012   Procedure: IRRIGATION AND DEBRIDEMENT EXTREMITY Right Ring Finger;  Surgeon: Tennis Must, MD;  Location: [facility];  Service: Orthopedics;  Laterality: Right;  . PARTIAL HYSTERECTOMY     ovaries left in place    Family History  Problem Relation Age of Onset  . Diabetes Mother   . CVA Mother   . Stroke Mother   . COPD Father   . Rheum arthritis Father   . Allergies Daughter   . COPD Daughter   . Stroke Daughter   . COPD Daughter        previous smoker  . Stroke Maternal Grandfather   . Heart disease Maternal Aunt   . Heart disease Maternal Uncle   . Colon cancer Neg Hx   . Colon polyps Neg Hx   . Esophageal cancer Neg Hx   . Gallbladder disease Neg Hx   . Kidney disease Neg Hx   . Heart attack Neg Hx   . Hypertension Neg Hx     Social History   Socioeconomic History  . Marital status: Widowed    Spouse name: Not on file  . Number of children: 5  . Years of education: Not on file  . Highest education level: Not on file  Occupational History  . Occupation: retired    Fish farm manager: RETIRED  Social Needs  . Financial  resource strain: Not on file  . Food insecurity:    Worry: Not on file    Inability: Not on file  . Transportation needs:    Medical: Not on file    Non-medical: Not on file  Tobacco Use  . Smoking status: Never Smoker  . Smokeless tobacco: Never Used  Substance and Sexual Activity  . Alcohol use: No  . Drug use: No  . Sexual activity: Not on file    Comment: lives with Daughter, grandson and his family. no dietary restricitons.  Lifestyle  . Physical activity:    Days per week: Not on file    Minutes per session: Not on file  . Stress: Not on file  Relationships  . Social connections:    Talks on phone: Not on file    Gets together: Not on file    Attends religious service: Not on file    Active member of club or organization: Not on file    Attends  meetings of clubs or organizations: Not on file    Relationship status: Not on file  . Intimate partner violence:    Fear of current or ex partner: Not on file    Emotionally abused: Not on file    Physically abused: Not on file    Forced sexual activity: Not on file  Other Topics Concern  . Not on file  Social History Narrative  . Not on file    Outpatient Medications Prior to Visit  Medication Sig Dispense Refill  . azelastine (ASTELIN) 0.1 % nasal spray Place 2 sprays into both nostrils 2 (two) times daily. Use in each nostril as directed 30 mL 5  . Cholecalciferol (VITAMIN D) 2000 UNITS CAPS Take 2,000 Units by mouth every other day.    . Cyanocobalamin (VITAMIN B-12) 500 MCG SUBL Place 1 tablet (500 mcg total) under the tongue daily at 2 PM. 150 tablet   . famotidine (PEPCID) 20 MG tablet Take 1 tablet (20 mg total) by mouth every evening. 30 tablet 3  . fluticasone (FLONASE) 50 MCG/ACT nasal spray Place 2 sprays into both nostrils daily. 16 g 1  . hydrALAZINE (APRESOLINE) 10 MG tablet TAKE 1 TABLET BY MOUTH THREE TIMES A DAY 90 tablet 0  . levocetirizine (XYZAL) 5 MG tablet TAKE 0.5 TABLETS (2.5 MG TOTAL) BY MOUTH EVERY EVENING. 15 tablet 0  . metoprolol succinate (TOPROL-XL) 50 MG 24 hr tablet Take 25 mg by mouth 2 (two) times daily. Take with or immediately following a meal.    . ramipril (ALTACE) 10 MG capsule TAKE 1 CAPSULE (10 MG TOTAL) BY MOUTH 2 (TWO) TIMES DAILY. 180 capsule 0  . tobramycin (TOBREX) 0.3 % ophthalmic solution Place 2 drops into both eyes every 6 (six) hours. 5 mL 0  . warfarin (COUMADIN) 2.5 MG tablet TAKE AS DIRECTED BY ANTICOAGULATION CLINIC. 120 tablet 1  . ALPRAZolam (XANAX) 0.25 MG tablet Take 0.5-1 tablets (0.125-0.25 mg total) by mouth 2 (two) times daily as needed for anxiety. 30 tablet 2  . pantoprazole (PROTONIX) 40 MG tablet TAKE 1 TABLET (40 MG TOTAL) BY MOUTH DAILY. 90 tablet 1   No facility-administered medications prior to visit.      Allergies  Allergen Reactions  . Amlodipine Shortness Of Breath  . Darifenacin Hydrobromide     REACTION: causes her dizziness  . Sulfonamide Derivatives Other (See Comments)    Pt thinks it caused dizziness  . Tramadol Other (See Comments)    Insomnia, anorexia  Review of Systems  Constitutional: Positive for malaise/fatigue. Negative for fever.  HENT: Negative for congestion.   Eyes: Negative for blurred vision.  Respiratory: Negative for shortness of breath.   Cardiovascular: Negative for chest pain, palpitations and leg swelling.  Gastrointestinal: Negative for abdominal pain, blood in stool and nausea.  Genitourinary: Negative for dysuria and frequency.  Musculoskeletal: Negative for falls.  Skin: Negative for rash.  Neurological: Negative for dizziness, loss of consciousness and headaches.  Endo/Heme/Allergies: Negative for environmental allergies.  Psychiatric/Behavioral: Negative for depression. The patient is not nervous/anxious.        Objective:    Physical Exam Vitals signs and nursing note reviewed.  Constitutional:      General: She is not in acute distress.    Appearance: She is well-developed.  HENT:     Head: Normocephalic and atraumatic.     Nose: Nose normal.  Eyes:     General:        Right eye: No discharge.        Left eye: No discharge.  Neck:     Musculoskeletal: Normal range of motion and neck supple.  Cardiovascular:     Rate and Rhythm: Normal rate and regular rhythm.     Heart sounds: No murmur.  Pulmonary:     Effort: Pulmonary effort is normal.     Breath sounds: Normal breath sounds.  Abdominal:     General: Bowel sounds are normal.     Palpations: Abdomen is soft.     Tenderness: There is no abdominal tenderness.  Skin:    General: Skin is warm and dry.  Neurological:     Mental Status: She is alert and oriented to person, place, and time.     BP 136/76   Pulse 78   Temp 97.9 F (36.6 C) (Oral)   Resp 18   Wt 98  lb 6.4 oz (44.6 kg)   SpO2 98%   BMI 19.87 kg/m  Wt Readings from Last 3 Encounters:  02/21/18 98 lb 6.4 oz (44.6 kg)  02/07/18 97 lb 12.8 oz (44.4 kg)  12/19/17 97 lb 8 oz (44.2 kg)     Lab Results  Component Value Date   WBC 8.1 02/07/2018   HGB 11.4 02/07/2018   HCT 34.4 02/07/2018   PLT 217 02/07/2018   GLUCOSE 90 02/21/2018   CHOL 182 05/09/2017   TRIG 171.0 (H) 05/09/2017   HDL 39.00 (L) 05/09/2017   LDLDIRECT 72.1 01/19/2009   LDLCALC 109 (H) 05/09/2017   ALT 11 02/21/2018   AST 16 02/21/2018   NA 141 02/21/2018   K 4.2 02/21/2018   CL 102 02/21/2018   CREATININE 0.96 02/21/2018   BUN 16 02/21/2018   CO2 32 02/21/2018   TSH 3.84 02/21/2018   INR 1.9 (A) 01/31/2018   HGBA1C 5.6 02/21/2018    Lab Results  Component Value Date   TSH 3.84 02/21/2018   Lab Results  Component Value Date   WBC 8.1 02/07/2018   HGB 11.4 02/07/2018   HCT 34.4 02/07/2018   MCV 93 02/07/2018   PLT 217 02/07/2018   Lab Results  Component Value Date   NA 141 02/21/2018   K 4.2 02/21/2018   CO2 32 02/21/2018   GLUCOSE 90 02/21/2018   BUN 16 02/21/2018   CREATININE 0.96 02/21/2018   BILITOT 0.4 02/21/2018   ALKPHOS 15 (L) 02/21/2018   AST 16 02/21/2018   ALT 11 02/21/2018   PROT 6.7 02/21/2018   ALBUMIN  3.9 02/21/2018   CALCIUM 9.3 02/21/2018   ANIONGAP 9 07/02/2017   GFR 54.34 (L) 02/21/2018   Lab Results  Component Value Date   CHOL 182 05/09/2017   Lab Results  Component Value Date   HDL 39.00 (L) 05/09/2017   Lab Results  Component Value Date   LDLCALC 109 (H) 05/09/2017   Lab Results  Component Value Date   TRIG 171.0 (H) 05/09/2017   Lab Results  Component Value Date   CHOLHDL 5 05/09/2017   Lab Results  Component Value Date   HGBA1C 5.6 02/21/2018       Assessment & Plan:   Problem List Items Addressed This Visit    Essential hypertension - Primary (Chronic)   Relevant Orders   TSH (Completed)   Magnesium (Completed)   Vitamin D  deficiency   Relevant Orders   Vitamin D (25 hydroxy) (Completed)   Anxiety state    Uses the Alprazolam sparingly without concerning side effects. Refills alllowed      Relevant Medications   ALPRAZolam (XANAX) 0.25 MG tablet   GERD    Avoid offending foods, start probiotics. Do not eat large meals in late evening and consider raising head of bed. Given refill on Pantoprazole.       Relevant Medications   pantoprazole (PROTONIX) 40 MG tablet   Renal insufficiency    Hydrate and monitor      Hyperglycemia   Relevant Orders   Hemoglobin A1c (Completed)   Comprehensive metabolic panel (Completed)    Other Visit Diagnoses    Insomnia, unspecified type       Relevant Medications   ALPRAZolam (XANAX) 0.25 MG tablet   Constipation, unspecified constipation type       Relevant Orders   Magnesium (Completed)      I am having [name] [name] maintain her Vitamin D, Vitamin B-12, azelastine, famotidine, ramipril, fluticasone, tobramycin, warfarin, hydrALAZINE, metoprolol succinate, levocetirizine, ALPRAZolam, and pantoprazole.  Meds ordered this encounter  Medications  . ALPRAZolam (XANAX) 0.25 MG tablet    Sig: Take 0.5-1 tablets (0.125-0.25 mg total) by mouth 2 (two) times daily as needed for anxiety.    Dispense:  30 tablet    Refill:  2  . pantoprazole (PROTONIX) 40 MG tablet    Sig: TAKE 1 TABLET (40 MG TOTAL) BY MOUTH DAILY.    Dispense:  90 tablet    Refill:  1     Penni Homans, MD

## 2018-02-23 NOTE — Assessment & Plan Note (Signed)
Avoid offending foods, start probiotics. Do not eat large meals in late evening and consider raising head of bed. Given refill on Pantoprazole.

## 2018-03-03 ENCOUNTER — Other Ambulatory Visit: Payer: Self-pay | Admitting: Family Medicine

## 2018-03-07 ENCOUNTER — Other Ambulatory Visit: Payer: Self-pay | Admitting: Family Medicine

## 2018-03-14 ENCOUNTER — Ambulatory Visit: Payer: Medicare Other | Admitting: Pharmacist

## 2018-03-14 ENCOUNTER — Other Ambulatory Visit: Payer: Self-pay

## 2018-03-14 DIAGNOSIS — Z5181 Encounter for therapeutic drug level monitoring: Secondary | ICD-10-CM | POA: Diagnosis not present

## 2018-03-14 DIAGNOSIS — I4891 Unspecified atrial fibrillation: Secondary | ICD-10-CM

## 2018-03-14 LAB — POCT INR: INR: 2.1 (ref 2.0–3.0)

## 2018-03-14 NOTE — Patient Instructions (Signed)
Description   Continue on same dosage 1 tablet daily except 1.5 tablets on Mondays and Fridays.  Recheck INR in 6 weeks. Call our office if you are placed on any new medications or if you have any concerns [phone].

## 2018-03-25 ENCOUNTER — Other Ambulatory Visit: Payer: Self-pay | Admitting: Family Medicine

## 2018-03-25 DIAGNOSIS — I10 Essential (primary) hypertension: Secondary | ICD-10-CM

## 2018-04-03 ENCOUNTER — Other Ambulatory Visit: Payer: Self-pay | Admitting: Family Medicine

## 2018-04-17 ENCOUNTER — Other Ambulatory Visit: Payer: Self-pay | Admitting: Family Medicine

## 2018-04-17 DIAGNOSIS — I10 Essential (primary) hypertension: Secondary | ICD-10-CM

## 2018-04-23 ENCOUNTER — Telehealth: Payer: Self-pay

## 2018-04-23 NOTE — Telephone Encounter (Signed)

## 2018-04-25 ENCOUNTER — Ambulatory Visit (INDEPENDENT_AMBULATORY_CARE_PROVIDER_SITE_OTHER): Payer: Medicare Other | Admitting: Pharmacist

## 2018-04-25 ENCOUNTER — Other Ambulatory Visit: Payer: Self-pay

## 2018-04-25 DIAGNOSIS — I4891 Unspecified atrial fibrillation: Secondary | ICD-10-CM

## 2018-04-25 DIAGNOSIS — Z5181 Encounter for therapeutic drug level monitoring: Secondary | ICD-10-CM | POA: Diagnosis not present

## 2018-04-25 LAB — POCT INR: INR: 2.1 (ref 2.0–3.0)

## 2018-04-25 MED ORDER — APIXABAN 2.5 MG PO TABS: 2.5000 mg | ORAL_TABLET | Freq: Two times a day (BID) | ORAL | 5 refills | Status: DC

## 2018-04-26 ENCOUNTER — Other Ambulatory Visit: Payer: Self-pay | Admitting: Family Medicine

## 2018-05-11 ENCOUNTER — Other Ambulatory Visit: Payer: Self-pay | Admitting: Family Medicine

## 2018-05-11 DIAGNOSIS — I10 Essential (primary) hypertension: Secondary | ICD-10-CM

## 2018-05-22 ENCOUNTER — Other Ambulatory Visit: Payer: Self-pay | Admitting: Family Medicine

## 2018-05-23 ENCOUNTER — Other Ambulatory Visit: Payer: Self-pay

## 2018-05-23 ENCOUNTER — Ambulatory Visit (INDEPENDENT_AMBULATORY_CARE_PROVIDER_SITE_OTHER): Payer: Medicare Other | Admitting: Family Medicine

## 2018-05-23 DIAGNOSIS — I10 Essential (primary) hypertension: Secondary | ICD-10-CM

## 2018-05-23 DIAGNOSIS — Z7901 Long term (current) use of anticoagulants: Secondary | ICD-10-CM | POA: Diagnosis not present

## 2018-05-23 DIAGNOSIS — N289 Disorder of kidney and ureter, unspecified: Secondary | ICD-10-CM

## 2018-05-23 DIAGNOSIS — R739 Hyperglycemia, unspecified: Secondary | ICD-10-CM | POA: Diagnosis not present

## 2018-05-25 ENCOUNTER — Other Ambulatory Visit: Payer: Self-pay | Admitting: Family Medicine

## 2018-05-26 NOTE — Assessment & Plan Note (Signed)
Hydrate and monitor 

## 2018-05-26 NOTE — Assessment & Plan Note (Signed)
Has recently been switched from Coumadin to Eliquis and is tolreating it well

## 2018-05-26 NOTE — Assessment & Plan Note (Signed)
hgba1c acceptable, minimize simple carbs. Increase exercise as tolerated.  

## 2018-05-26 NOTE — Progress Notes (Signed)
Virtual Visit via Telephone Note  I connected with [name] [name] on 05/23/2018 at 10:20 AM EDT by a telephone enabled telemedicine application and verified that I am speaking with the correct person using two identifiers.  Location: Patient: home Provider: home   I discussed the limitations of evaluation and management by telemedicine and the availability of in person appointments. The patient expressed understanding and agreed to proceed. [name] CMA was able to get patient set up on video visit.     Subjective:    Patient ID: [name] [name], female    DOB: [birth year], 85 y.o.   MRN: [id number]  No chief complaint on file.   HPI Patient is in today for follow up on hypertension, atrial fibrillation and hyperglycemia. She feels well today. No recent febrile illness or hospitalizations. Cardiology has recently switched her from coumadin to Eliquis and she is tolerating it well. Denies CP/palp/SOB/HA/congestion/fevers/GI or GU c/o. Taking meds as prescribed  Past Medical History:  Diagnosis Date  . Anemia   . Anxiety   . Arthritis    hips, knees  . Atrial fibrillation ([place])   . Current use of long term anticoagulation   . Diverticulitis of colon 02/06/2015  . Diverticulosis   . Gait difficulty   . GERD (gastroesophageal reflux disease)   . Headache(784.0) 06/22/2012  . Hyperlipidemia   . Hypertension   . IBS (irritable bowel syndrome)   . Incontinence   . Loss of weight 06/05/2015  . Medicare annual wellness visit, subsequent 02/05/2014  . Melena   . Mitral and aortic regurgitation   . Mitral valve prolapse    hx of  . Osteopenia   . Pedal edema 10/01/2016  . Personal history of colonic polyps   . Renal lithiasis 06/26/2015  . Thyroid disease    hypo  . Unspecified adverse effect of other drug, medicinal and biological substance(995.29)   . Vitamin B12 deficiency 03/16/2016  . Vitamin D deficiency     Past Surgical History:  Procedure Laterality Date  . BOTOX  INJECTION N/A 09/03/2012   Procedure: BOTOX INJECTION;  Surgeon: Inda Castle, MD;  Location: [facility];  Service: Endoscopy;  Laterality: N/A;  . BREAST BIOPSY Left   . BREAST LUMPECTOMY Right 08/18/2014   Procedure: RIGHT BREAST LUMPECTOMY;  Surgeon: Coralie Keens, MD;  Location: [place];  Service: General;  Laterality: Right;  . CARDIAC ELECTROPHYSIOLOGY MAPPING AND ABLATION    . CATARACT EXTRACTION, BILATERAL    . ESOPHAGOGASTRODUODENOSCOPY N/A 09/03/2012   Procedure: ESOPHAGOGASTRODUODENOSCOPY (EGD);  Surgeon: Inda Castle, MD;  Location: [facility];  Service: Endoscopy;  Laterality: N/A;  . ESOPHAGOSCOPY W/ BOTOX INJECTION    . I&D EXTREMITY Right 11/06/2012   Procedure: IRRIGATION AND DEBRIDEMENT EXTREMITY Right Ring Finger;  Surgeon: Tennis Must, MD;  Location: [place];  Service: Orthopedics;  Laterality: Right;  . PARTIAL HYSTERECTOMY     ovaries left in place    Family History  Problem Relation Age of Onset  . Diabetes Mother   . CVA Mother   . Stroke Mother   . COPD Father   . Rheum arthritis Father   . Allergies Daughter   . COPD Daughter   . Stroke Daughter   . COPD Daughter        previous smoker  . Stroke Maternal Grandfather   . Heart disease Maternal Aunt   . Heart disease Maternal Uncle   . Colon cancer Neg Hx   . Colon polyps  Neg Hx   . Esophageal cancer Neg Hx   . Gallbladder disease Neg Hx   . Kidney disease Neg Hx   . Heart attack Neg Hx   . Hypertension Neg Hx     Social History   Socioeconomic History  . Marital status: Widowed    Spouse name: Not on file  . Number of children: 5  . Years of education: Not on file  . Highest education level: Not on file  Occupational History  . Occupation: retired    Fish farm manager: RETIRED  Social Needs  . Financial resource strain: Not on file  . Food insecurity:    Worry: Not on file    Inability: Not on file  . Transportation needs:    Medical: Not on file    Non-medical: Not on  file  Tobacco Use  . Smoking status: Never Smoker  . Smokeless tobacco: Never Used  Substance and Sexual Activity  . Alcohol use: No  . Drug use: No  . Sexual activity: Not on file    Comment: lives with Daughter, grandson and his family. no dietary restricitons.  Lifestyle  . Physical activity:    Days per week: Not on file    Minutes per session: Not on file  . Stress: Not on file  Relationships  . Social connections:    Talks on phone: Not on file    Gets together: Not on file    Attends religious service: Not on file    Active member of club or organization: Not on file    Attends meetings of clubs or organizations: Not on file    Relationship status: Not on file  . Intimate partner violence:    Fear of current or ex partner: Not on file    Emotionally abused: Not on file    Physically abused: Not on file    Forced sexual activity: Not on file  Other Topics Concern  . Not on file  Social History Narrative  . Not on file    Outpatient Medications Prior to Visit  Medication Sig Dispense Refill  . ALPRAZolam (XANAX) 0.25 MG tablet Take 0.5-1 tablets (0.125-0.25 mg total) by mouth 2 (two) times daily as needed for anxiety. 30 tablet 2  . apixaban (ELIQUIS) 2.5 MG TABS tablet Take 1 tablet (2.5 mg total) by mouth 2 (two) times daily. 60 tablet 5  . azelastine (ASTELIN) 0.1 % nasal spray Place 2 sprays into both nostrils 2 (two) times daily. Use in each nostril as directed 30 mL 5  . Cholecalciferol (VITAMIN D) 2000 UNITS CAPS Take 2,000 Units by mouth every other day.    . Cyanocobalamin (VITAMIN B-12) 500 MCG SUBL Place 1 tablet (500 mcg total) under the tongue daily at 2 PM. 150 tablet   . famotidine (PEPCID) 20 MG tablet Take 1 tablet (20 mg total) by mouth every evening. 30 tablet 3  . fluticasone (FLONASE) 50 MCG/ACT nasal spray Place 2 sprays into both nostrils daily. 16 g 1  . hydrALAZINE (APRESOLINE) 10 MG tablet TAKE 1 TABLET BY MOUTH 3 TIMES A DAY 90 tablet 1  .  levocetirizine (XYZAL) 5 MG tablet TAKE 1/2 TABLET (2.5 MG TOTAL) BY MOUTH EVERY EVENING. 15 tablet 0  . metoprolol succinate (TOPROL-XL) 50 MG 24 hr tablet Take 25 mg by mouth 2 (two) times daily. Take with or immediately following a meal.    . pantoprazole (PROTONIX) 40 MG tablet TAKE 1 TABLET (40 MG TOTAL) BY MOUTH DAILY. [place]  tablet 1  . ramipril (ALTACE) 10 MG capsule TAKE 1 CAPSULE (10 MG TOTAL) BY MOUTH 2 (TWO) TIMES DAILY. 180 capsule 0  . tobramycin (TOBREX) 0.3 % ophthalmic solution Place 2 drops into both eyes every 6 (six) hours. 5 mL 0   No facility-administered medications prior to visit.     Allergies  Allergen Reactions  . Amlodipine Shortness Of Breath  . Darifenacin Hydrobromide     REACTION: causes her dizziness  . Sulfonamide Derivatives Other (See Comments)    Pt thinks it caused dizziness  . Tramadol Other (See Comments)    Insomnia, anorexia    Review of Systems  Constitutional: Positive for malaise/fatigue. Negative for fever.  HENT: Negative for congestion.   Eyes: Negative for blurred vision.  Respiratory: Negative for shortness of breath.   Cardiovascular: Negative for chest pain, palpitations and leg swelling.  Gastrointestinal: Negative for abdominal pain, blood in stool and nausea.  Genitourinary: Negative for dysuria and frequency.  Musculoskeletal: Negative for falls.  Skin: Negative for rash.  Neurological: Negative for dizziness, loss of consciousness and headaches.  Endo/Heme/Allergies: Negative for environmental allergies.  Psychiatric/Behavioral: Negative for depression. The patient is not nervous/anxious.        Objective:    Physical Exam Unable to obtain via Telephone visit.   There were no vitals taken for this visit. Wt Readings from Last 3 Encounters:  02/21/18 98 lb 6.4 oz (44.6 kg)  02/07/18 97 lb 12.8 oz (44.4 kg)  12/19/17 97 lb 8 oz (44.2 kg)    Diabetic Foot Exam - Simple   No data filed     Lab Results  Component  Value Date   WBC 8.1 02/07/2018   HGB 11.4 02/07/2018   HCT 34.4 02/07/2018   PLT 217 02/07/2018   GLUCOSE 90 02/21/2018   CHOL 182 05/09/2017   TRIG 171.0 (H) 05/09/2017   HDL 39.00 (L) 05/09/2017   LDLDIRECT 72.1 01/19/2009   LDLCALC 109 (H) 05/09/2017   ALT 11 02/21/2018   AST 16 02/21/2018   NA 141 02/21/2018   K 4.2 02/21/2018   CL 102 02/21/2018   CREATININE 0.96 02/21/2018   BUN 16 02/21/2018   CO2 32 02/21/2018   TSH 3.84 02/21/2018   INR 2.1 04/25/2018   HGBA1C 5.6 02/21/2018    Lab Results  Component Value Date   TSH 3.84 02/21/2018   Lab Results  Component Value Date   WBC 8.1 02/07/2018   HGB 11.4 02/07/2018   HCT 34.4 02/07/2018   MCV 93 02/07/2018   PLT 217 02/07/2018   Lab Results  Component Value Date   NA 141 02/21/2018   K 4.2 02/21/2018   CO2 32 02/21/2018   GLUCOSE 90 02/21/2018   BUN 16 02/21/2018   CREATININE 0.96 02/21/2018   BILITOT 0.4 02/21/2018   ALKPHOS 15 (L) 02/21/2018   AST 16 02/21/2018   ALT 11 02/21/2018   PROT 6.7 02/21/2018   ALBUMIN 3.9 02/21/2018   CALCIUM 9.3 02/21/2018   ANIONGAP 9 07/02/2017   GFR 54.34 (L) 02/21/2018   Lab Results  Component Value Date   CHOL 182 05/09/2017   Lab Results  Component Value Date   HDL 39.00 (L) 05/09/2017   Lab Results  Component Value Date   LDLCALC 109 (H) 05/09/2017   Lab Results  Component Value Date   TRIG 171.0 (H) 05/09/2017   Lab Results  Component Value Date   CHOLHDL 5 05/09/2017   Lab Results  Component  Value Date   HGBA1C 5.6 02/21/2018       Assessment & Plan:   Problem List Items Addressed This Visit    Essential hypertension (Chronic)     no changes to meds. Encouraged heart healthy diet such as the DASH diet and exercise as tolerated.       Chronic anticoagulation (Chronic)    Has recently been switched from Coumadin to Eliquis and is tolreating it well      Renal insufficiency    Hydrate and monitor      Hyperglycemia    hgba1c  acceptable, minimize simple carbs. Increase exercise as tolerated.          I am having [name] [name] maintain her Vitamin D, Vitamin B-12, azelastine, famotidine, fluticasone, tobramycin, metoprolol succinate, ALPRAZolam, pantoprazole, ramipril, apixaban, levocetirizine, and hydrALAZINE.  No orders of the defined types were placed in this encounter.   I discussed the assessment and treatment plan with the patient. The patient was provided an opportunity to ask questions and all were answered. The patient agreed with the plan and demonstrated an understanding of the instructions.   The patient was advised to call back or seek an in-person evaluation if the symptoms worsen or if the condition fails to improve as anticipated.  I provided 25 minutes of non-face-to-face time during this encounter.   Penni Homans, MD

## 2018-05-26 NOTE — Assessment & Plan Note (Signed)
no changes to meds. Encouraged heart healthy diet such as the DASH diet and exercise as tolerated.  

## 2018-06-09 ENCOUNTER — Encounter: Payer: Self-pay | Admitting: Family Medicine

## 2018-06-09 LAB — HM DIABETES FOOT EXAM: HM Diabetic Foot Exam: NORMAL

## 2018-06-22 ENCOUNTER — Other Ambulatory Visit: Payer: Self-pay | Admitting: Family Medicine

## 2018-07-12 ENCOUNTER — Other Ambulatory Visit: Payer: Self-pay | Admitting: Family Medicine

## 2018-07-12 DIAGNOSIS — I10 Essential (primary) hypertension: Secondary | ICD-10-CM

## 2018-07-16 ENCOUNTER — Other Ambulatory Visit: Payer: Self-pay | Admitting: Family Medicine

## 2018-07-17 ENCOUNTER — Ambulatory Visit (INDEPENDENT_AMBULATORY_CARE_PROVIDER_SITE_OTHER): Payer: Medicare Other | Admitting: Family Medicine

## 2018-07-17 ENCOUNTER — Other Ambulatory Visit: Payer: Self-pay

## 2018-07-17 DIAGNOSIS — M25562 Pain in left knee: Secondary | ICD-10-CM | POA: Diagnosis not present

## 2018-07-17 DIAGNOSIS — M25572 Pain in left ankle and joints of left foot: Secondary | ICD-10-CM

## 2018-07-17 DIAGNOSIS — I10 Essential (primary) hypertension: Secondary | ICD-10-CM

## 2018-07-17 DIAGNOSIS — K219 Gastro-esophageal reflux disease without esophagitis: Secondary | ICD-10-CM | POA: Diagnosis not present

## 2018-07-17 DIAGNOSIS — M1990 Unspecified osteoarthritis, unspecified site: Secondary | ICD-10-CM

## 2018-07-17 DIAGNOSIS — R739 Hyperglycemia, unspecified: Secondary | ICD-10-CM

## 2018-07-17 DIAGNOSIS — R350 Frequency of micturition: Secondary | ICD-10-CM

## 2018-07-17 MED ORDER — CEFDINIR 300 MG PO CAPS: 300.0000 mg | ORAL_CAPSULE | Freq: Two times a day (BID) | ORAL | 0 refills | Status: AC

## 2018-07-17 MED ORDER — FAMOTIDINE 40 MG PO TABS: 40.0000 mg | ORAL_TABLET | Freq: Every day | ORAL | 3 refills | Status: DC

## 2018-07-20 NOTE — Assessment & Plan Note (Signed)
encouraged to check vitals weekly no changes to meds. Encouraged heart healthy diet such as the DASH diet and exercise as tolerated.

## 2018-07-20 NOTE — Progress Notes (Signed)
Virtual Visit via phone Note  I connected with [name] [name] on 07/17/18 at  2:20 PM EDT by a phone enabled telemedicine application and verified that I am speaking with the correct person using two identifiers.  Location: Patient: home Provider: home   I discussed the limitations of evaluation and management by telemedicine and the availability of in person appointments. The patient expressed understanding and agreed to proceed. [name], CMA was able to get patient set up on phone after being unable to arrange a video visit    Subjective:    Patient ID: [name] [name], female    DOB: 1924-02-19, 85 y.o.   MRN: [id number]  No chief complaint on file.   HPI Patient is in today for follow up on chronic medical concerns including hypertension, hyperlipidemia, reflux and more. Is having increased trouble with reflux and dysphagia again. Her appetite is poor. Her bowels are moving. No recent febrile illness or hospitalizations. No back pain but is noting left lower extremity pain from knee to ankle worse with certain positions. No falls. Notes some urinary urgency, frequency and dribbling at times. Denies CP/palp/SOB/HA/congestion/fevers. Taking meds as prescribed  Past Medical History:  Diagnosis Date  . Anemia   . Anxiety   . Arthritis    hips, knees  . Atrial fibrillation ([place])   . Current use of long term anticoagulation   . Diverticulitis of colon 02/06/2015  . Diverticulosis   . Gait difficulty   . GERD (gastroesophageal reflux disease)   . Headache(784.0) 06/22/2012  . Hyperlipidemia   . Hypertension   . IBS (irritable bowel syndrome)   . Incontinence   . Loss of weight 06/05/2015  . Medicare annual wellness visit, subsequent 02/05/2014  . Melena   . Mitral and aortic regurgitation   . Mitral valve prolapse    hx of  . Osteopenia   . Pedal edema 10/01/2016  . Personal history of colonic polyps   . Renal lithiasis 06/26/2015  . Thyroid disease    hypo  . Unspecified  adverse effect of other drug, medicinal and biological substance(995.29)   . Vitamin B12 deficiency 03/16/2016  . Vitamin D deficiency     Past Surgical History:  Procedure Laterality Date  . BOTOX INJECTION N/A 09/03/2012   Procedure: BOTOX INJECTION;  Surgeon: Inda Castle, MD;  Location: [facility];  Service: Endoscopy;  Laterality: N/A;  . BREAST BIOPSY Left   . BREAST LUMPECTOMY Right 08/18/2014   Procedure: RIGHT BREAST LUMPECTOMY;  Surgeon: Coralie Keens, MD;  Location: [place];  Service: General;  Laterality: Right;  . CARDIAC ELECTROPHYSIOLOGY MAPPING AND ABLATION    . CATARACT EXTRACTION, BILATERAL    . ESOPHAGOGASTRODUODENOSCOPY N/A 09/03/2012   Procedure: ESOPHAGOGASTRODUODENOSCOPY (EGD);  Surgeon: Inda Castle, MD;  Location: [facility];  Service: Endoscopy;  Laterality: N/A;  . ESOPHAGOSCOPY W/ BOTOX INJECTION    . I&D EXTREMITY Right 11/06/2012   Procedure: IRRIGATION AND DEBRIDEMENT EXTREMITY Right Ring Finger;  Surgeon: Tennis Must, MD;  Location: [place];  Service: Orthopedics;  Laterality: Right;  . PARTIAL HYSTERECTOMY     ovaries left in place    Family History  Problem Relation Age of Onset  . Diabetes Mother   . CVA Mother   . Stroke Mother   . COPD Father   . Rheum arthritis Father   . Allergies Daughter   . COPD Daughter   . Stroke Daughter   . COPD Daughter  previous smoker  . Stroke Maternal Grandfather   . Heart disease Maternal Aunt   . Heart disease Maternal Uncle   . Colon cancer Neg Hx   . Colon polyps Neg Hx   . Esophageal cancer Neg Hx   . Gallbladder disease Neg Hx   . Kidney disease Neg Hx   . Heart attack Neg Hx   . Hypertension Neg Hx     Social History   Socioeconomic History  . Marital status: Widowed    Spouse name: Not on file  . Number of children: 5  . Years of education: Not on file  . Highest education level: Not on file  Occupational History  . Occupation: retired    Fish farm manager: RETIRED   Social Needs  . Financial resource strain: Not on file  . Food insecurity    Worry: Not on file    Inability: Not on file  . Transportation needs    Medical: Not on file    Non-medical: Not on file  Tobacco Use  . Smoking status: Never Smoker  . Smokeless tobacco: Never Used  Substance and Sexual Activity  . Alcohol use: No  . Drug use: No  . Sexual activity: Not on file    Comment: lives with Daughter, grandson and his family. no dietary restricitons.  Lifestyle  . Physical activity    Days per week: Not on file    Minutes per session: Not on file  . Stress: Not on file  Relationships  . Social Herbalist on phone: Not on file    Gets together: Not on file    Attends religious service: Not on file    Active member of club or organization: Not on file    Attends meetings of clubs or organizations: Not on file    Relationship status: Not on file  . Intimate partner violence    Fear of current or ex partner: Not on file    Emotionally abused: Not on file    Physically abused: Not on file    Forced sexual activity: Not on file  Other Topics Concern  . Not on file  Social History Narrative  . Not on file    Outpatient Medications Prior to Visit  Medication Sig Dispense Refill  . ALPRAZolam (XANAX) 0.25 MG tablet Take 0.5-1 tablets (0.125-0.25 mg total) by mouth 2 (two) times daily as needed for anxiety. 30 tablet 2  . apixaban (ELIQUIS) 2.5 MG TABS tablet Take 1 tablet (2.5 mg total) by mouth 2 (two) times daily. 60 tablet 5  . azelastine (ASTELIN) 0.1 % nasal spray Place 2 sprays into both nostrils 2 (two) times daily. Use in each nostril as directed 30 mL 5  . Cholecalciferol (VITAMIN D) 2000 UNITS CAPS Take 2,000 Units by mouth every other day.    . Cyanocobalamin (VITAMIN B-12) 500 MCG SUBL Place 1 tablet (500 mcg total) under the tongue daily at 2 PM. 150 tablet   . fluticasone (FLONASE) 50 MCG/ACT nasal spray Place 2 sprays into both nostrils daily. 16 g 1   . hydrALAZINE (APRESOLINE) 10 MG tablet TAKE 1 TABLET BY MOUTH THREE TIMES A DAY 90 tablet 1  . levocetirizine (XYZAL) 5 MG tablet TAKE 1/2 TABLET (2.5 MG TOTAL) BY MOUTH EVERY EVENING. 15 tablet 0  . metoprolol succinate (TOPROL-XL) 50 MG 24 hr tablet Take 25 mg by mouth 2 (two) times daily. Take with or immediately following a meal.    . pantoprazole (PROTONIX)  40 MG tablet TAKE 1 TABLET (40 MG TOTAL) BY MOUTH DAILY. 90 tablet 1  . ramipril (ALTACE) 10 MG capsule TAKE 1 CAPSULE (10 MG TOTAL) BY MOUTH 2 (TWO) TIMES DAILY. 180 capsule 0  . tobramycin (TOBREX) 0.3 % ophthalmic solution Place 2 drops into both eyes every 6 (six) hours. 5 mL 0  . famotidine (PEPCID) 20 MG tablet Take 1 tablet (20 mg total) by mouth every evening. 30 tablet 3   No facility-administered medications prior to visit.     Allergies  Allergen Reactions  . Amlodipine Shortness Of Breath  . Darifenacin Hydrobromide     REACTION: causes her dizziness  . Sulfonamide Derivatives Other (See Comments)    Pt thinks it caused dizziness  . Tramadol Other (See Comments)    Insomnia, anorexia    Review of Systems  Constitutional: Positive for malaise/fatigue. Negative for fever.  HENT: Negative for congestion.   Eyes: Negative for blurred vision.  Respiratory: Negative for shortness of breath.   Cardiovascular: Negative for chest pain, palpitations and leg swelling.  Gastrointestinal: Positive for heartburn and nausea. Negative for abdominal pain and blood in stool.  Genitourinary: Negative for dysuria and frequency.  Musculoskeletal: Positive for joint pain and myalgias. Negative for falls.  Skin: Negative for rash.  Neurological: Negative for dizziness, loss of consciousness and headaches.  Endo/Heme/Allergies: Negative for environmental allergies.  Psychiatric/Behavioral: Negative for depression. The patient is not nervous/anxious.        Objective:    Physical Exam unable to obtain over phone  There were no  vitals taken for this visit. Wt Readings from Last 3 Encounters:  02/21/18 98 lb 6.4 oz (44.6 kg)  02/07/18 97 lb 12.8 oz (44.4 kg)  12/19/17 97 lb 8 oz (44.2 kg)    Diabetic Foot Exam - Simple   No data filed     Lab Results  Component Value Date   WBC 8.1 02/07/2018   HGB 11.4 02/07/2018   HCT 34.4 02/07/2018   PLT 217 02/07/2018   GLUCOSE 90 02/21/2018   CHOL 182 05/09/2017   TRIG 171.0 (H) 05/09/2017   HDL 39.00 (L) 05/09/2017   LDLDIRECT 72.1 01/19/2009   LDLCALC 109 (H) 05/09/2017   ALT 11 02/21/2018   AST 16 02/21/2018   NA 141 02/21/2018   K 4.2 02/21/2018   CL 102 02/21/2018   CREATININE 0.96 02/21/2018   BUN 16 02/21/2018   CO2 32 02/21/2018   TSH 3.84 02/21/2018   INR 2.1 04/25/2018   HGBA1C 5.6 02/21/2018    Lab Results  Component Value Date   TSH 3.84 02/21/2018   Lab Results  Component Value Date   WBC 8.1 02/07/2018   HGB 11.4 02/07/2018   HCT 34.4 02/07/2018   MCV 93 02/07/2018   PLT 217 02/07/2018   Lab Results  Component Value Date   NA 141 02/21/2018   K 4.2 02/21/2018   CO2 32 02/21/2018   GLUCOSE 90 02/21/2018   BUN 16 02/21/2018   CREATININE 0.96 02/21/2018   BILITOT 0.4 02/21/2018   ALKPHOS 15 (L) 02/21/2018   AST 16 02/21/2018   ALT 11 02/21/2018   PROT 6.7 02/21/2018   ALBUMIN 3.9 02/21/2018   CALCIUM 9.3 02/21/2018   ANIONGAP 9 07/02/2017   GFR 54.34 (L) 02/21/2018   Lab Results  Component Value Date   CHOL 182 05/09/2017   Lab Results  Component Value Date   HDL 39.00 (L) 05/09/2017   Lab Results  Component  Value Date   LDLCALC 109 (H) 05/09/2017   Lab Results  Component Value Date   TRIG 171.0 (H) 05/09/2017   Lab Results  Component Value Date   CHOLHDL 5 05/09/2017   Lab Results  Component Value Date   HGBA1C 5.6 02/21/2018       Assessment & Plan:   Problem List Items Addressed This Visit    Essential hypertension (Chronic)    encouraged to check vitals weekly no changes to meds. Encouraged  heart healthy diet such as the DASH diet and exercise as tolerated.       GERD    Avoid offending foods, start probiotics. Do not eat large meals in late evening and consider raising head of bed. Increase Famotidine and if symptoms continue to worsen she will have to return to Gastroenterology      Relevant Medications   famotidine (PEPCID) 40 MG tablet   Arthritis    Notes some flare in left leg pain notably from ankle to knee when she lies down and sits. No recent fall or back pain. Encouraged moist heat and gentle stretching as tolerated. May try NSAIDs and prescription meds as directed and report if symptoms worsen or seek immediate care report if persists or worsens.      Urinary frequency    Notes some urgency and incontinence recently. Will start Cefdinir and she will report if no improvement.       Hyperglycemia    hgba1c acceptable, minimize simple carbs. Increase exercise as tolerated.        Other Visit Diagnoses    Left knee pain, unspecified chronicity    -  Primary   Relevant Orders   Ambulatory referral to Orthopedic Surgery   Left ankle pain, unspecified chronicity       Relevant Orders   Ambulatory referral to Orthopedic Surgery      I have discontinued [employer] [facility] [facility]'s famotidine. I am also having her start on famotidine and cefdinir. Additionally, I am having her maintain her Vitamin D, Vitamin B-12, azelastine, fluticasone, tobramycin, metoprolol succinate, ALPRAZolam, pantoprazole, apixaban, ramipril, hydrALAZINE, and levocetirizine.  Meds ordered this encounter  Medications  . famotidine (PEPCID) 40 MG tablet    Sig: Take 1 tablet (40 mg total) by mouth daily.    Dispense:  30 tablet    Refill:  3  . cefdinir (OMNICEF) 300 MG capsule    Sig: Take 1 capsule (300 mg total) by mouth 2 (two) times daily for 10 days.    Dispense:  14 capsule    Refill:  0     I discussed the assessment and treatment plan with the patient. The patient was provided an  opportunity to ask questions and all were answered. The patient agreed with the plan and demonstrated an understanding of the instructions.   The patient was advised to call back or seek an in-person evaluation if the symptoms worsen or if the condition fails to improve as anticipated.  I provided 25 minutes of non-face-to-face time during this encounter.   Penni Homans, MD

## 2018-07-20 NOTE — Assessment & Plan Note (Signed)
hgba1c acceptable, minimize simple carbs. Increase exercise as tolerated.  

## 2018-07-20 NOTE — Assessment & Plan Note (Signed)
Notes some flare in left leg pain notably from ankle to knee when she lies down and sits. No recent fall or back pain. Encouraged moist heat and gentle stretching as tolerated. May try NSAIDs and prescription meds as directed and report if symptoms worsen or seek immediate care report if persists or worsens.

## 2018-07-20 NOTE — Assessment & Plan Note (Signed)
Notes some urgency and incontinence recently. Will start Cefdinir and she will report if no improvement.

## 2018-07-20 NOTE — Assessment & Plan Note (Signed)
Avoid offending foods, start probiotics. Do not eat large meals in late evening and consider raising head of bed. Increase Famotidine and if symptoms continue to worsen she will have to return to Gastroenterology

## 2018-07-29 ENCOUNTER — Ambulatory Visit (INDEPENDENT_AMBULATORY_CARE_PROVIDER_SITE_OTHER): Payer: Medicare Other | Admitting: Orthopaedic Surgery

## 2018-07-29 ENCOUNTER — Encounter: Payer: Self-pay | Admitting: Physician Assistant

## 2018-07-29 DIAGNOSIS — M7672 Peroneal tendinitis, left leg: Secondary | ICD-10-CM

## 2018-07-29 MED ORDER — DICLOFENAC SODIUM 1 % TD GEL: 2.0000 g | Freq: Four times a day (QID) | TRANSDERMAL | 1 refills | Status: DC

## 2018-07-29 NOTE — Progress Notes (Signed)
Office Visit Note   Patient: [name] [name]           Date of Birth: 14-May-1924           MRN: [id number] Visit Date: 07/29/2018              Requested by: Mosie Lukes, MD [address] [place],  [place] [address] PCP: Mosie Lukes, MD   Assessment & Plan: Visit Diagnoses:  1. Peroneal tendinitis, left     Plan: Impression is left ankle peroneal tendinitis.  I have called in a prescription of Voltaren gel as well as provided the patient with a physical therapy prescription.  I do not think she is symptomatic enough to put in a cam walker.  She will follow-up with [place] as needed.  Call with concerns or questions in the meantime.  Follow-Up Instructions: Return if symptoms worsen or fail to improve.   Orders:  No orders of the defined types were placed in this encounter.  Meds ordered this encounter  Medications  . diclofenac sodium (VOLTAREN) 1 % GEL    Sig: Apply 2 g topically 4 (four) times daily.    Dispense:  100 g    Refill:  1      Procedures: No procedures performed   Clinical Data: No additional findings.   Subjective: Chief Complaint  Patient presents with  . Left Leg - Pain  . Right Leg - Pain    HPI patient is a pleasant 85 year old female who presents our clinic today with left ankle pain.  This has been ongoing for the past 3 weeks.  No known injury or change in activity.  The pain she has is to the lateral ankle along the peroneal tendon.  She does notice increased pain with ambulation as well as when she lies down at night to go to sleep.  She has tried her pain pills with mild relief of symptoms.  She does think that this pain has improved over the past week or so.  Review of Systems as detailed in HPI.  All others reviewed and are negative.   Objective: Vital Signs: There were no vitals taken for this visit.  Physical Exam well-developed well-nourished female no acute distress.  Alert and oriented x3.  Ortho Exam examination  of her left ankle reveals mild to moderate tenderness along the peroneal tendon.  Full ankle range of motion without pain.  No pain with resisted inversion or eversion.  No bony tenderness.  She is neurovascularly intact distally.  Specialty Comments:  No specialty comments available.  Imaging: No new imaging   PMFS History: Patient Active Problem List   Diagnosis Date Noted  . Cough 09/02/2017  . Hyponatremia 06/29/2017  . Bradycardia 06/29/2017  . Chest pain 12/10/2016  . Pedal edema 10/01/2016  . Preventative health care 03/18/2016  . Vitamin B12 deficiency 03/16/2016  . Renal lithiasis 06/26/2015  . Loss of weight 06/05/2015  . Diverticulitis of colon 02/06/2015  . Right-sided thoracic back pain 11/15/2014  . Nipple discharge 06/21/2014  . Rib pain on right side 04/29/2014  . Medicare annual wellness visit, subsequent 02/05/2014  . Abdominal pain, chronic, right lower quadrant 02/03/2014  . Rectal bleeding 12/03/2013  . Prolapse of female pelvic organs 12/03/2013  . Leukocytes in urine 11/25/2013  . Abnormal chest x-ray 10/23/2013  . Pulmonary hypertension ([place]) 10/09/2013  . Tricuspid regurgitation 10/09/2013  . Chronic anticoagulation 10/09/2013  . Hyperglycemia 08/24/2013  . Encounter  for therapeutic drug monitoring 03/13/2013  . Renal insufficiency 06/28/2012  . Hyperkalemia 06/28/2012  . Headache 06/22/2012  . Sinus bradycardia 09/20/2011  . Urinary frequency 02/13/2011  . Arthritis 11/13/2010  . Long term (current) use of anticoagulants 03/31/2010  . DIZZINESS 08/03/2009  . ACHALASIA 05/18/2009  . DYSPHAGIA UNSPECIFIED 04/11/2009  . Anemia 01/19/2009  . ESOPHAGEAL STRICTURE 01/19/2009  . Anxiety state 09/15/2008  . MITRAL REGURGITATION, MILD 08/20/2008  . PAROXYSMAL ATRIAL FIBRILLATION 08/20/2008  . MITRAL VALVE PROLAPSE, HX OF 08/20/2008  . Vitamin D deficiency 01/15/2008  . IBS 02/03/2007  . Hypothyroidism 01/29/2007  . Hyperlipidemia, mixed  01/29/2007  . Essential hypertension 01/29/2007  . GERD 01/29/2007  . Disorder of bone and cartilage 01/29/2007  . COLONIC POLYPS, HX OF 01/29/2007   Past Medical History:  Diagnosis Date  . Anemia   . Anxiety   . Arthritis    hips, knees  . Atrial fibrillation ([place])   . Current use of long term anticoagulation   . Diverticulitis of colon 02/06/2015  . Diverticulosis   . Gait difficulty   . GERD (gastroesophageal reflux disease)   . Headache(784.0) 06/22/2012  . Hyperlipidemia   . Hypertension   . IBS (irritable bowel syndrome)   . Incontinence   . Loss of weight 06/05/2015  . Medicare annual wellness visit, subsequent 02/05/2014  . Melena   . Mitral and aortic regurgitation   . Mitral valve prolapse    hx of  . Osteopenia   . Pedal edema 10/01/2016  . Personal history of colonic polyps   . Renal lithiasis 06/26/2015  . Thyroid disease    hypo  . Unspecified adverse effect of other drug, medicinal and biological substance(995.29)   . Vitamin B12 deficiency 03/16/2016  . Vitamin D deficiency     Family History  Problem Relation Age of Onset  . Diabetes Mother   . CVA Mother   . Stroke Mother   . COPD Father   . Rheum arthritis Father   . Allergies Daughter   . COPD Daughter   . Stroke Daughter   . COPD Daughter        previous smoker  . Stroke Maternal Grandfather   . Heart disease Maternal Aunt   . Heart disease Maternal Uncle   . Colon cancer Neg Hx   . Colon polyps Neg Hx   . Esophageal cancer Neg Hx   . Gallbladder disease Neg Hx   . Kidney disease Neg Hx   . Heart attack Neg Hx   . Hypertension Neg Hx     Past Surgical History:  Procedure Laterality Date  . BOTOX INJECTION N/A 09/03/2012   Procedure: BOTOX INJECTION;  Surgeon: Inda Castle, MD;  Location: [facility];  Service: Endoscopy;  Laterality: N/A;  . BREAST BIOPSY Left   . BREAST LUMPECTOMY Right 08/18/2014   Procedure: RIGHT BREAST LUMPECTOMY;  Surgeon: Coralie Keens, MD;  Location: [facility];  Service: General;  Laterality: Right;  . CARDIAC ELECTROPHYSIOLOGY MAPPING AND ABLATION    . CATARACT EXTRACTION, BILATERAL    . ESOPHAGOGASTRODUODENOSCOPY N/A 09/03/2012   Procedure: ESOPHAGOGASTRODUODENOSCOPY (EGD);  Surgeon: Inda Castle, MD;  Location: [facility];  Service: Endoscopy;  Laterality: N/A;  . ESOPHAGOSCOPY W/ BOTOX INJECTION    . I&D EXTREMITY Right 11/06/2012   Procedure: IRRIGATION AND DEBRIDEMENT EXTREMITY Right Ring Finger;  Surgeon: Tennis Must, MD;  Location: [facility];  Service: Orthopedics;  Laterality: Right;  . PARTIAL HYSTERECTOMY  ovaries left in place   Social History   Occupational History  . Occupation: retired    Fish farm manager: RETIRED  Tobacco Use  . Smoking status: Never Smoker  . Smokeless tobacco: Never Used  Substance and Sexual Activity  . Alcohol use: No  . Drug use: No  . Sexual activity: Not on file    Comment: lives with Daughter, grandson and his family. no dietary restricitons.

## 2018-08-09 ENCOUNTER — Other Ambulatory Visit: Payer: Self-pay | Admitting: Family Medicine

## 2018-08-11 ENCOUNTER — Other Ambulatory Visit: Payer: Self-pay | Admitting: Family Medicine

## 2018-08-13 ENCOUNTER — Encounter: Payer: Self-pay | Admitting: Physician Assistant

## 2018-08-13 NOTE — Progress Notes (Signed)
Cardiology Office Note    Date:  08/15/2018   ID:  [name] [name], DOB [birth year], MRN [id number]  PCP:  Mosie Lukes, MD  Cardiologist:  Ena Dawley, MD  Electrophysiologist:  None   Chief Complaint: f/u afib  History of Present Illness:   [name] [name] is a 85 y.o. female with history of persistent atrial fibrillation, prior atrial flutter as well, essential hypertension, sinus bradycardia, hyperlipidemia, anemia, anxiety, arthritis, diverticulosis, GERD, thyroid disease and low body weight who presents for 6 month f/u. She has been felt to have had chronic atrial fib however EKGs in 2018 and 2019 showed sinus bradycardia. Last echo in 2018 showed EF 55-60%, mild MR, moderate LAE, mild TR, normal PASP. She was seen in the ER 06/20/17 with discomfort in chest, upper abdomen and back. CT angio chest/abd/pelvis showed no acute dissection/aneurysm, + atherosclerotic vascular disease with suspected renal artery stenosis, possible 16 mmhyperenhancing splenic mass, sigmoid diverticular disease -> OP w/u of splenic mass recommended. She was then re-hospitalized with weakness and decreased appetite found to be hyponatremic and slightly bradycardic.  She was treated for possible SIADH. Initially the patient was bradycardic and her beta-blocker was held but heart rate increased to greater than 100. She was seen in consultation by Dr. Marlou Porch who felt that her rhythm was actually atrial flutter appearing. He advised continued rate control on low-dose Toprol and watch for bradycardia. At last f/u 02/2018 she was felt to be doing well overall. Coumadin was switched to Eliquis during begininng of Covid pandemic. Last labs in 02/2018 showed normal TSH, K 4.2, Cr 0.96, normal LFTs, A1C 5.6, Hgb 11.4.  She returns for follow-up today overall feeling fine. She denies any chest pain, SOB, dizziness, falling, lightheadedness, or nausea. She just began using a walker today in case stability was needed maneuvering  our office but has been generally able to ambulate without this. She is noted to be tachycardic in the 120s today with probable atrial flutter (reviewed with Dr. Tamala Julian) She ran out of her metoprolol 3-4 days ago. She does not really feel this. She does check her BP/HR periodically and remembers the most recent HR reading at home being in the 50s-60s.    Past Medical History:  Diagnosis Date  . Anemia   . Anxiety   . Arthritis    hips, knees  . Current use of long term anticoagulation   . Diverticulitis of colon 02/06/2015  . Diverticulosis   . Gait difficulty   . GERD (gastroesophageal reflux disease)   . Headache(784.0) 06/22/2012  . Hyperlipidemia   . Hypertension   . IBS (irritable bowel syndrome)   . Incontinence   . Loss of weight 06/05/2015  . Medicare annual wellness visit, subsequent 02/05/2014  . Melena   . Mild mitral regurgitation   . Mild tricuspid regurgitation   . Mitral and aortic regurgitation   . Mitral valve prolapse    hx of - not seen on recent echoes  . Osteopenia   . Paroxysmal atrial flutter ([place])   . Pedal edema 10/01/2016  . Persistent atrial fibrillation   . Personal history of colonic polyps   . Renal lithiasis 06/26/2015  . Sinus bradycardia   . Thyroid disease    hypo  . Unspecified adverse effect of other drug, medicinal and biological substance(995.29)   . Vitamin B12 deficiency 03/16/2016  . Vitamin D deficiency     Past Surgical History:  Procedure Laterality Date  . BOTOX INJECTION N/A 09/03/2012  Procedure: BOTOX INJECTION;  Surgeon: Inda Castle, MD;  Location: [facility];  Service: Endoscopy;  Laterality: N/A;  . BREAST BIOPSY Left   . BREAST LUMPECTOMY Right 08/18/2014   Procedure: RIGHT BREAST LUMPECTOMY;  Surgeon: Coralie Keens, MD;  Location: [facility];  Service: General;  Laterality: Right;  . CARDIAC ELECTROPHYSIOLOGY MAPPING AND ABLATION    . CATARACT EXTRACTION, BILATERAL    . ESOPHAGOGASTRODUODENOSCOPY N/A  09/03/2012   Procedure: ESOPHAGOGASTRODUODENOSCOPY (EGD);  Surgeon: Inda Castle, MD;  Location: [facility];  Service: Endoscopy;  Laterality: N/A;  . ESOPHAGOSCOPY W/ BOTOX INJECTION    . I&D EXTREMITY Right 11/06/2012   Procedure: IRRIGATION AND DEBRIDEMENT EXTREMITY Right Ring Finger;  Surgeon: Tennis Must, MD;  Location: [facility];  Service: Orthopedics;  Laterality: Right;  . PARTIAL HYSTERECTOMY     ovaries left in place    Current Medications: Current Meds  Medication Sig  . ALPRAZolam (XANAX) 0.25 MG tablet Take 0.5-1 tablets (0.125-0.25 mg total) by mouth 2 (two) times daily as needed for anxiety.  Marland Kitchen apixaban (ELIQUIS) 2.5 MG TABS tablet Take 1 tablet (2.5 mg total) by mouth 2 (two) times daily.  Marland Kitchen azelastine (ASTELIN) 0.1 % nasal spray Place 2 sprays into both nostrils 2 (two) times daily. Use in each nostril as directed  . Cholecalciferol (VITAMIN D) 2000 UNITS CAPS Take 2,000 Units by mouth every other day.  . Cyanocobalamin (VITAMIN B-12) 500 MCG SUBL Place 1 tablet (500 mcg total) under the tongue daily at 2 PM.  . diclofenac sodium (VOLTAREN) 1 % GEL Apply 2 g topically 4 (four) times daily.  . famotidine (PEPCID) 20 MG tablet Take 40 mg by mouth daily.  . fluticasone (FLONASE) 50 MCG/ACT nasal spray Place 2 sprays into both nostrils daily.  . hydrALAZINE (APRESOLINE) 10 MG tablet Take 10 mg by mouth 2 (two) times daily.  Marland Kitchen levocetirizine (XYZAL) 5 MG tablet TAKE 1/2 TABLET (2.5 MG TOTAL) BY MOUTH EVERY EVENING.  . metoprolol succinate (TOPROL-XL) 50 MG 24 hr tablet Take 1/2 tablet by mouth twice a day with or immediately following a meal.  . pantoprazole (PROTONIX) 40 MG tablet TAKE 1 TABLET BY MOUTH EVERY DAY  . ramipril (ALTACE) 10 MG capsule TAKE 1 CAPSULE (10 MG TOTAL) BY MOUTH 2 (TWO) TIMES DAILY.  . [DISCONTINUED] metoprolol succinate (TOPROL-XL) 50 MG 24 hr tablet Take 25 mg by mouth 2 (two) times daily. Take with or immediately following a meal.     Allergies:    Amlodipine, Darifenacin hydrobromide, Sulfonamide derivatives, and Tramadol   Social History   Socioeconomic History  . Marital status: Widowed    Spouse name: Not on file  . Number of children: 5  . Years of education: Not on file  . Highest education level: Not on file  Occupational History  . Occupation: retired    Fish farm manager: RETIRED  Social Needs  . Financial resource strain: Not on file  . Food insecurity    Worry: Not on file    Inability: Not on file  . Transportation needs    Medical: Not on file    Non-medical: Not on file  Tobacco Use  . Smoking status: Never Smoker  . Smokeless tobacco: Never Used  Substance and Sexual Activity  . Alcohol use: No  . Drug use: No  . Sexual activity: Not on file    Comment: lives with Daughter, grandson and his family. no dietary restricitons.  Lifestyle  . Physical activity  Days per week: Not on file    Minutes per session: Not on file  . Stress: Not on file  Relationships  . Social Herbalist on phone: Not on file    Gets together: Not on file    Attends religious service: Not on file    Active member of club or organization: Not on file    Attends meetings of clubs or organizations: Not on file    Relationship status: Not on file  Other Topics Concern  . Not on file  Social History Narrative  . Not on file     Family History:  The patient's family history includes Allergies in her daughter; COPD in her daughter, daughter, and father; CVA in her mother; Diabetes in her mother; Heart disease in her maternal aunt and maternal uncle; Rheum arthritis in her father; Stroke in her daughter, maternal grandfather, and mother. There is no history of Colon cancer, Colon polyps, Esophageal cancer, Gallbladder disease, Kidney disease, Heart attack, or Hypertension.  ROS:   Please see the history of present illness. + recent rx for UTI. All other systems are reviewed and otherwise negative.    EKGs/Labs/Other Studies  Reviewed:    Studies reviewed were summarized above.   EKG:  EKG is ordered today.  The EKG ordered today demonstrates likely atrial flutter 2:1 HR 122bpm with diffuse ST sagging II, III, avF, V3-V6 (ST changes similar to prior) - less likely sinus tach   Recent Labs: 02/07/2018: Hemoglobin 11.4; Platelets 217 02/21/2018: ALT 11; BUN 16; Creatinine, Ser 0.96; Magnesium 1.8; Potassium 4.2; Sodium 141; TSH 3.84  Recent Lipid Panel    Component Value Date/Time   CHOL 182 05/09/2017 1310   TRIG 171.0 (H) 05/09/2017 1310   HDL 39.00 (L) 05/09/2017 1310   CHOLHDL 5 05/09/2017 1310   VLDL 34.2 05/09/2017 1310   LDLCALC 109 (H) 05/09/2017 1310   LDLDIRECT 72.1 01/19/2009 1026    PHYSICAL EXAM:    VS:  BP (!) 142/80   Pulse (!) 120   Ht 4\' 11"  (1.499 m)   Wt 95 lb 12.8 oz (43.5 kg)   SpO2 97%   BMI 19.35 kg/m   BMI: Body mass index is 19.35 kg/m.  GEN: Well developed petite elderly WF, in no acute distress HEENT: normocephalic, atraumatic Neck: no JVD, carotid bruits, or masses Cardiac: Reg rhythm, tachycardic; no murmurs, rubs, or gallops, no edema  Respiratory:  clear to auscultation bilaterally, normal work of breathing GI: soft, nontender, nondistended, + BS MS:  kyphotic posture Skin: warm and dry, no rash Neuro:  Alert and Oriented x 3, Strength and sensation are intact, follows commands Psych: euthymic mood, full affect  Wt Readings from Last 3 Encounters:  08/15/18 95 lb 12.8 oz (43.5 kg)  02/21/18 98 lb 6.4 oz (44.6 kg)  02/07/18 97 lb 12.8 oz (44.4 kg)     ASSESSMENT & PLAN:   1. Atrial flutter - reviewed EKG with Dr. Tamala Julian, felt to represent atrial flutter with 2:1 conduction, cannot totally exclude sinus tach. She is relatively asymptomatic and denies any CP, SOB, significant palpitations, or dizziness. She ran out of her metoprolol 3-4 days ago which is likely contributing. Per d/w MD, will plan to restart metoprolol, check labs to exclude metabolic driving cause,  and obtain a 3 day Zio monitor to assess HR control. Will have her do a virtual visit when I am in virtual clinic next on 8/31 to touch base. She can check her  vitals at home. She consented to phone visit. 2. Persistent AF - follow on monitor. Continue Eliquis at lower dose for age/wt. Bleeding precautions reviewed. She has not had any falls. CHADSVASC 4. Check CBC/BMET today with labs. 3. Essential HTN - BP not unreasonable for age. Follow with resumption of BB. 4. Sinus bradycardia - follow HR trend on monitor, being cautious not to overshoot HR.   Disposition: F/u with me virtually on 09/01/18.  Medication Adjustments/Labs and Tests Ordered: Current medicines are reviewed at length with the patient today.  Concerns regarding medicines are outlined above. Medication changes, Labs and Tests ordered today are summarized above and listed in the Patient Instructions accessible in Encounters.   Signed, Charlie Pitter, PA-C  08/15/2018 11:37 AM    [facility] [address], [place], [place]  [address] Phone: [phone]; Fax: [phone]

## 2018-08-14 ENCOUNTER — Other Ambulatory Visit: Payer: Self-pay | Admitting: Family Medicine

## 2018-08-15 ENCOUNTER — Other Ambulatory Visit: Payer: Self-pay

## 2018-08-15 ENCOUNTER — Telehealth: Payer: Self-pay | Admitting: Radiology

## 2018-08-15 ENCOUNTER — Ambulatory Visit (INDEPENDENT_AMBULATORY_CARE_PROVIDER_SITE_OTHER): Payer: Medicare Other | Admitting: Physician Assistant

## 2018-08-15 ENCOUNTER — Encounter: Payer: Self-pay | Admitting: Physician Assistant

## 2018-08-15 VITALS — BP 142/80 | HR 120 | Ht 59.0 in | Wt 95.8 lb

## 2018-08-15 DIAGNOSIS — I10 Essential (primary) hypertension: Secondary | ICD-10-CM | POA: Diagnosis not present

## 2018-08-15 DIAGNOSIS — I4819 Other persistent atrial fibrillation: Secondary | ICD-10-CM | POA: Diagnosis not present

## 2018-08-15 DIAGNOSIS — I4892 Unspecified atrial flutter: Secondary | ICD-10-CM

## 2018-08-15 DIAGNOSIS — R001 Bradycardia, unspecified: Secondary | ICD-10-CM | POA: Diagnosis not present

## 2018-08-15 MED ORDER — METOPROLOL SUCCINATE ER 50 MG PO TB24: ORAL_TABLET | ORAL | 3 refills | Status: DC

## 2018-08-15 NOTE — Patient Instructions (Signed)
Medication Instructions:  Your physician has recommended you make the following change in your medication: 1.  RESTART Metoprolol of 25 mg taking 1/2 tablet twice a day  If you need a refill on your cardiac medications before your next appointment, please call your pharmacy.   Lab work: TODAY:  BMET, MAG, CBC, & TSH   If you have labs (blood work) drawn today and your tests are completely normal, you will receive your results only by: Marland Kitchen MyChart Message (if you have MyChart) OR . A paper copy in the mail If you have any lab test that is abnormal or we need to change your treatment, we will call you to review the results.  Testing/Procedures: Your physician has recommended that you wear an heart monitor.  (soemone will call you to set this up)  Heart monitors are medical devices that record the heart's electrical activity. Doctors most often [place] these monitors to diagnose arrhythmias. Arrhythmias are problems with the speed or rhythm of the heartbeat. The monitor is a small, portable device. You can wear one while you do your normal daily activities. This is usually used to diagnose what is causing palpitations/syncope (passing out).    Follow-Up: At [facility], you and your health needs are our priority.  As part of our continuing mission to provide you with exceptional heart care, we have created designated Provider Care Teams.  These Care Teams include your primary Cardiologist (physician) and Advanced Practice Providers (APPs -  Physician Assistants and Nurse Practitioners) who all work together to provide you with the care you need, when you need it. Dennis Bast are scheduled for a Virtual Visit with Melina Copa, PA-C 09/01/2018 at 11:00.  (see information below)     Virtual Visit Pre-Appointment Phone Call  "(Name), I am calling you today to discuss your upcoming appointment. We are currently trying to limit exposure to the virus that causes COVID-19 by seeing patients at home rather than  in the office."  1. "What is the BEST phone number to call the day of the visit?" - include this in appointment notes  2. "Do you have or have access to (through a family member/friend) a smartphone with video capability that we can use for your visit?" a. If yes - list this number in appt notes as "cell" (if different from BEST phone #) and list the appointment type as a VIDEO visit in appointment notes b. If no - list the appointment type as a PHONE visit in appointment notes  3. Confirm consent - "In the setting of the current Covid19 crisis, you are scheduled for a (phone or video) visit with your provider on (date) at (time).  Just as we do with many in-office visits, in order for you to participate in this visit, we must obtain consent.  If you'd like, I can send this to your mychart (if signed up) or email for you to review.  Otherwise, I can obtain your verbal consent now.  All virtual visits are billed to your insurance company just like a normal visit would be.  By agreeing to a virtual visit, we'd like you to understand that the technology does not allow for your provider to perform an examination, and thus may limit your provider's ability to fully assess your condition. If your provider identifies any concerns that need to be evaluated in person, we will make arrangements to do so.  Finally, though the technology is pretty good, we cannot assure that it will always work  on either your or our end, and in the setting of a video visit, we may have to convert it to a phone-only visit.  In either situation, we cannot ensure that we have a secure connection.  Are you willing to proceed?" STAFF: Did the patient verbally acknowledge consent to telehealth visit? Document YES/NO here: YES  4. Advise patient to be prepared - "Two hours prior to your appointment, go ahead and check your blood pressure, pulse, oxygen saturation, and your weight (if you have the equipment to check those) and write them all  down. When your visit starts, your provider will ask you for this information. If you have an Apple Watch or Kardia device, please plan to have heart rate information ready on the day of your appointment. Please have a pen and paper handy nearby the day of the visit as well."  5. Give patient instructions for MyChart download to smartphone OR Doximity/Doxy.me as below if video visit (depending on what platform provider is using)  6. Inform patient they will receive a phone call 15 minutes prior to their appointment time (may be from unknown caller ID) so they should be prepared to answer    TELEPHONE CALL NOTE  [name] [name] has been deemed a candidate for a follow-up tele-health visit to limit community exposure during the Covid-19 pandemic. I spoke with the patient via phone to ensure availability of phone/video source, confirm preferred email & phone number, and discuss instructions and expectations.  I reminded [name] [name] to be prepared with any vital sign and/or heart rhythm information that could potentially be obtained via home monitoring, at the time of her visit. I reminded [name] [name] to expect a phone call prior to her visit.  Jeanann Lewandowsky, [place] 08/15/2018 11:38 AM   INSTRUCTIONS FOR DOWNLOADING THE MYCHART APP TO SMARTPHONE  - The patient must first make sure to have activated MyChart and know their login information - If Apple, go to [employer] and type in MyChart in the search bar and download the app. If Android, ask patient to go to [employer] and type in [place] in the search bar and download the app. The app is free but as with any other app downloads, their phone may require them to verify saved payment information or Apple/Android password.  - The patient will need to then log into the app with their MyChart username and password, and select [facility] as their healthcare provider to link the account. When it is time for your visit, go to the MyChart app,  find appointments, and click Begin Video Visit. Be sure to Select Allow for your device to access the Microphone and Camera for your visit. You will then be connected, and your provider will be with you shortly.  **If they have any issues connecting, or need assistance please contact MyChart service desk [phone])**  **If using a computer, in order to ensure the best quality for their visit they will need to use either of the following Internet Browsers: [employer], or Google Chrome**  IF USING DOXIMITY or DOXY.ME - The patient will receive a link just prior to their visit by text.     FULL LENGTH CONSENT FOR TELE-HEALTH VISIT   I hereby voluntarily request, consent and authorize [facility] and its employed or contracted physicians, physician assistants, nurse practitioners or other licensed health care professionals (the Practitioner), to provide me with telemedicine health care services (the "Services") as deemed necessary by the  treating Practitioner. I acknowledge and consent to receive the Services by the Practitioner via telemedicine. I understand that the telemedicine visit will involve communicating with the Practitioner through live audiovisual communication technology and the disclosure of certain medical information by electronic transmission. I acknowledge that I have been given the opportunity to request an in-person assessment or other available alternative prior to the telemedicine visit and am voluntarily participating in the telemedicine visit.  I understand that I have the right to withhold or withdraw my consent to the use of telemedicine in the course of my care at any time, without affecting my right to future care or treatment, and that the Practitioner or I may terminate the telemedicine visit at any time. I understand that I have the right to inspect all information obtained and/or recorded in the course of the telemedicine visit and may receive copies  of available information for a reasonable fee.  I understand that some of the potential risks of receiving the Services via telemedicine include:  Marland Kitchen Delay or interruption in medical evaluation due to technological equipment failure or disruption; . Information transmitted may not be sufficient (e.g. poor resolution of images) to allow for appropriate medical decision making by the Practitioner; and/or  . In rare instances, security protocols could fail, causing a breach of personal health information.  Furthermore, I acknowledge that it is my responsibility to provide information about my medical history, conditions and care that is complete and accurate to the best of my ability. I acknowledge that Practitioner's advice, recommendations, and/or decision may be based on factors not within their control, such as incomplete or inaccurate data provided by me or distortions of diagnostic images or specimens that may result from electronic transmissions. I understand that the practice of medicine is not an exact science and that Practitioner makes no warranties or guarantees regarding treatment outcomes. I acknowledge that I will receive a copy of this consent concurrently upon execution via email to the email address I last provided but may also request a printed copy by calling the office of [facility].    I understand that my insurance will be billed for this visit.   I have read or had this consent read to me. . I understand the contents of this consent, which adequately explains the benefits and risks of the Services being provided via telemedicine.  . I have been provided ample opportunity to ask questions regarding this consent and the Services and have had my questions answered to my satisfaction. . I give my informed consent for the services to be provided through the use of telemedicine in my medical care  By participating in this telemedicine visit I agree to the above.   Any Other Special  Instructions Will Be Listed Below (If Applicable).

## 2018-08-15 NOTE — Telephone Encounter (Signed)
Enrolled patient for a 3 Day Zio monitor to be mailed. Brief instructions were gone over with patient and she knows to expect the monitor to arrive in 3-4 days.

## 2018-08-16 LAB — CBC
Hematocrit: 35.7 % (ref 34.0–46.6)
Hemoglobin: 11.7 g/dL (ref 11.1–15.9)
MCH: 30.6 pg (ref 26.6–33.0)
MCHC: 32.8 g/dL (ref 31.5–35.7)
MCV: 94 fL (ref 79–97)
Platelets: 204 10*3/uL (ref 150–450)
RBC: 3.82 x10E6/uL (ref 3.77–5.28)
RDW: 14.2 % (ref 11.7–15.4)
WBC: 6.3 10*3/uL (ref 3.4–10.8)

## 2018-08-16 LAB — BASIC METABOLIC PANEL
BUN/Creatinine Ratio: 12 (ref 12–28)
BUN: 11 mg/dL (ref 10–36)
CO2: 26 mmol/L (ref 20–29)
Calcium: 9.5 mg/dL (ref 8.7–10.3)
Chloride: 101 mmol/L (ref 96–106)
Creatinine, Ser: 0.93 mg/dL (ref 0.57–1.00)
GFR calc Af Amer: 62 mL/min/{1.73_m2} (ref >59)
GFR calc non Af Amer: 54 mL/min/{1.73_m2} — ABNORMAL LOW (ref >59)
Glucose: 91 mg/dL (ref 65–99)
Potassium: 4.3 mmol/L (ref 3.5–5.2)
Sodium: 141 mmol/L (ref 134–144)

## 2018-08-16 LAB — TSH: TSH: 4.93 u[IU]/mL — ABNORMAL HIGH (ref 0.450–4.500)

## 2018-08-16 LAB — MAGNESIUM: Magnesium: 1.9 mg/dL (ref 1.6–2.3)

## 2018-08-18 ENCOUNTER — Telehealth: Payer: Self-pay | Admitting: *Deleted

## 2018-08-18 NOTE — Telephone Encounter (Signed)
Call placed to pt re: lab results, left a message to call back.

## 2018-08-18 NOTE — Telephone Encounter (Signed)
-----   Message from Charlie Pitter, [place] sent at 08/18/2018  8:14 AM EDT ----- Please let patient know labs are all stable except her thyroid is somewhat abnormal, might represent that she is not getting enough thyroid hormone. Would recommend she touch base with PCP to discuss next steps - if she wants our office to help assist, please get a free T4 level - can send [facility] out to help if needed, or she can just find out from primary care how they'd like to address this. Dayna Dunn PA-C

## 2018-08-22 ENCOUNTER — Other Ambulatory Visit (INDEPENDENT_AMBULATORY_CARE_PROVIDER_SITE_OTHER): Payer: Medicare Other

## 2018-08-22 ENCOUNTER — Other Ambulatory Visit: Payer: Self-pay | Admitting: Family Medicine

## 2018-08-22 DIAGNOSIS — I4892 Unspecified atrial flutter: Secondary | ICD-10-CM

## 2018-08-22 DIAGNOSIS — I10 Essential (primary) hypertension: Secondary | ICD-10-CM

## 2018-08-22 DIAGNOSIS — R001 Bradycardia, unspecified: Secondary | ICD-10-CM | POA: Diagnosis not present

## 2018-08-22 DIAGNOSIS — I4819 Other persistent atrial fibrillation: Secondary | ICD-10-CM

## 2018-08-26 NOTE — Telephone Encounter (Signed)
Call placed to pt re: lab results.  Pt has been made aware. See result note.

## 2018-08-31 NOTE — Progress Notes (Signed)
Virtual Visit via Telephone Note   This visit type was conducted due to national recommendations for restrictions regarding the COVID-19 Pandemic (e.g. social distancing) in an effort to limit this patient's exposure and mitigate transmission in our community.  Due to her co-morbid illnesses, this patient is at least at moderate risk for complications without adequate follow up.  This format is felt to be most appropriate for this patient at this time.  The patient did not have access to video technology/had technical difficulties with video requiring transitioning to audio format only (telephone).  All issues noted in this document were discussed and addressed.  No physical exam could be performed with this format.  We consented patient at the end of her last office visit and she consented/agreed to participate in telehealth visit with [place].  Date:  09/01/2018   ID:  [name] [name], DOB 1924/11/07, MRN [id number]  Patient Location: Home Provider Location: Home  PCP:  Mosie Lukes, MD  Cardiologist:  Ena Dawley, MD  Electrophysiologist:  None   Evaluation Performed:  Follow-Up Visit  Chief Complaint:  F/u tachycardia  History of Present Illness:    [name] [name] is a 85 y.o. female with persistent atrial fibrillation, atrial flutter as well, essential hypertension, sinus bradycardia, hyperlipidemia, anemia, anxiety, arthritis, diverticulosis, GERD, thyroid disease and low body weight who presents for monitor follow-up.  She has been felt to have had chronic atrial fib however EKGs in 2018 and 2019 showed sinus bradycardia. Last echo in 2018 showed EF 55-60%, mild MR, moderate LAE, mild TR, normal PASP. She was seen in the ER 06/20/17 with discomfort in chest, upper abdomen and back. CT angio chest/abd/pelvis showed no acute dissection/aneurysm, + atherosclerotic vascular disease with suspected renal artery stenosis, possible 16 mmhyperenhancing splenic mass, sigmoid  diverticular disease -> OP w/u of splenic mass recommended. She was then re-hospitalized with weakness and decreased appetite found to be hyponatremic and slightly bradycardic. She was treated for possible SIADH. Initially the patient was bradycardic and her beta-blocker was held but heart rate increased to greater than 100. She was seen in consultation by Dr. Marlou Porch who felt that her rhythm was actually atrial flutter appearing. He advised continued rate control on low-dose Toprol and watch for bradycardia. At last f/u 02/2018 she was felt to be doing well overall. Coumadin was switched to Eliquis during begininng of Covid pandemic. I saw her back for routine follow-up 08/15/18 in whcih she was feeling well but was tachycardic in the 120s with probable atrial flutter per review with Dr. Tamala Julian (D[facility]). She had run out of her metoprolol 3-4 days prior, so we restarted this. She does check her BP/HR periodically and remembered the most recent HR reading at home being in the 50s-60s. Labs 08/15/18 showed TSH 4.930 (instructed to f/u PCP), normal CBC, Mg 1.9, K 4.3, Cr 0.93.   Event monitor was arranged but results are not back yet. Per monitor tech, expected to come back in 24-48 hours for Dr. [name] to read. The patient affirms she continues to feel well overall without acute complaint. She can occasionally feel palpitations, moreso at night, but not particularly bothersome or impacting qualify of life. Her blood pressure is slightly elevated today but she indicates it runs higher when she is either rushing around or anxious about a visit. She reports it runs normal when she is relaxed. She previously tried to titrate her BP medications in the past (hydralazine) and experienced dizziness, so has maintained on the  present regimen overall feeling well. The patient does not have symptoms concerning for COVID-19 infection (fever, chills, cough, or new shortness of breath).    Past Medical History:  Diagnosis Date    Anemia    Anxiety    Arthritis    hips, knees   Current use of long term anticoagulation    Diverticulitis of colon 02/06/2015   Diverticulosis    Gait difficulty    GERD (gastroesophageal reflux disease)    Headache(784.0) 06/22/2012   Hyperlipidemia    Hypertension    IBS (irritable bowel syndrome)    Incontinence    Loss of weight 06/05/2015   Medicare annual wellness visit, subsequent 02/05/2014   Melena    Mild mitral regurgitation    Mild tricuspid regurgitation    Mitral and aortic regurgitation    Mitral valve prolapse    hx of - not seen on recent echoes   Osteopenia    Paroxysmal atrial flutter ([place])    Pedal edema 10/01/2016   Persistent atrial fibrillation    Personal history of colonic polyps    Renal lithiasis 06/26/2015   Sinus bradycardia    Thyroid disease    hypo   Unspecified adverse effect of other drug, medicinal and biological substance(995.29)    Vitamin B12 deficiency 03/16/2016   Vitamin D deficiency    Past Surgical History:  Procedure Laterality Date   BOTOX INJECTION N/A 09/03/2012   Procedure: BOTOX INJECTION;  Surgeon: Inda Castle, MD;  Location: [facility];  Service: Endoscopy;  Laterality: N/A;   BREAST BIOPSY Left    BREAST LUMPECTOMY Right 08/18/2014   Procedure: RIGHT BREAST LUMPECTOMY;  Surgeon: Coralie Keens, MD;  Location: [facility];  Service: General;  Laterality: Right;   CARDIAC ELECTROPHYSIOLOGY MAPPING AND ABLATION     CATARACT EXTRACTION, BILATERAL     ESOPHAGOGASTRODUODENOSCOPY N/A 09/03/2012   Procedure: ESOPHAGOGASTRODUODENOSCOPY (EGD);  Surgeon: Inda Castle, MD;  Location: [facility];  Service: Endoscopy;  Laterality: N/A;   ESOPHAGOSCOPY W/ BOTOX INJECTION     I&D EXTREMITY Right 11/06/2012   Procedure: IRRIGATION AND DEBRIDEMENT EXTREMITY Right Ring Finger;  Surgeon: Tennis Must, MD;  Location: [facility];  Service: Orthopedics;  Laterality: Right;   PARTIAL  HYSTERECTOMY     ovaries left in place     Current Meds  Medication Sig   ALPRAZolam (XANAX) 0.25 MG tablet Take 0.5-1 tablets (0.125-0.25 mg total) by mouth 2 (two) times daily as needed for anxiety.   apixaban (ELIQUIS) 2.5 MG TABS tablet Take 1 tablet (2.5 mg total) by mouth 2 (two) times daily.   azelastine (ASTELIN) 0.1 % nasal spray Place 2 sprays into both nostrils 2 (two) times daily. Use in each nostril as directed   Cholecalciferol (VITAMIN D) 2000 UNITS CAPS Take 2,000 Units by mouth every other day.   Cyanocobalamin (VITAMIN B-12) 500 MCG SUBL Place 1 tablet (500 mcg total) under the tongue daily at 2 PM.   diclofenac sodium (VOLTAREN) 1 % GEL Apply 2 g topically 4 (four) times daily.   famotidine (PEPCID) 20 MG tablet Take 40 mg by mouth daily.   fluticasone (FLONASE) 50 MCG/ACT nasal spray Place 2 sprays into both nostrils daily.   hydrALAZINE (APRESOLINE) 10 MG tablet Take 10 mg by mouth 2 (two) times daily.   levocetirizine (XYZAL) 5 MG tablet TAKE 1/2 TABLET (2.5 MG TOTAL) BY MOUTH EVERY EVENING.   metoprolol succinate (TOPROL-XL) 50 MG 24 hr tablet Take 1/2 tablet by mouth  twice a day with or immediately following a meal.   pantoprazole (PROTONIX) 40 MG tablet TAKE 1 TABLET BY MOUTH EVERY DAY   ramipril (ALTACE) 10 MG capsule TAKE 1 CAPSULE (10 MG TOTAL) BY MOUTH 2 (TWO) TIMES DAILY.     Allergies:   Amlodipine, Darifenacin hydrobromide, Sulfonamide derivatives, and Tramadol   Social History   Tobacco Use   Smoking status: Never Smoker   Smokeless tobacco: Never Used  Substance Use Topics   Alcohol use: No   Drug use: No     Family Hx: The patient's family history includes Allergies in her daughter; COPD in her daughter, daughter, and father; CVA in her mother; Diabetes in her mother; Heart disease in her maternal aunt and maternal uncle; Rheum arthritis in her father; Stroke in her daughter, maternal grandfather, and mother. There is no history of  Colon cancer, Colon polyps, Esophageal cancer, Gallbladder disease, Kidney disease, Heart attack, or Hypertension.  ROS:   Please see the history of present illness.    All other systems reviewed and are negative.   Prior CV studies:    Most recent pertinent cardiac studies are outlined above.  Labs/Other Tests and Data Reviewed:    EKG:  An ECG dated 08/15/18 was personally reviewed today and demonstrated:  atrial flutter 2:1 HR 122bpm with diffuse ST sagging II, III, avF, V3-V6 (ST changes similar to prior) - less likely sinus tach   Recent Labs: 02/21/2018: ALT 11 08/15/2018: BUN 11; Creatinine, Ser 0.93; Hemoglobin 11.7; Magnesium 1.9; Platelets 204; Potassium 4.3; Sodium 141; TSH 4.930   Recent Lipid Panel Lab Results  Component Value Date/Time   CHOL 182 05/09/2017 01:10 PM   TRIG 171.0 (H) 05/09/2017 01:10 PM   HDL 39.00 (L) 05/09/2017 01:10 PM   CHOLHDL 5 05/09/2017 01:10 PM   LDLCALC 109 (H) 05/09/2017 01:10 PM   LDLDIRECT 72.1 01/19/2009 10:26 AM    Wt Readings from Last 3 Encounters:  09/01/18 92 lb 9.6 oz (42 kg)  08/15/18 95 lb 12.8 oz (43.5 kg)  02/21/18 98 lb 6.4 oz (44.6 kg)     Objective:    Vital Signs:  BP (!) 164/92    Pulse 91    Ht 4\' 11"  (1.499 m)    Wt 92 lb 9.6 oz (42 kg)    SpO2 96%    BMI 18.70 kg/m    VS reviewed. General - pleasant F in no acute distress Pulm - No labored breathing, no coughing during visit, no audible wheezing, speaking in full sentences Neuro - A+Ox3, no slurred speech, answers questions appropriately Psych - Pleasant affect     ASSESSMENT & PLAN:    1. Atrial flutter, also with history of persistent AF - recently tachycardic in setting of running out of metoprolol. HR is now in the 90s back on this medicine. Event monitor results are being processed by the monitor company and will be available in the next few days for MD review. It has previously been recommended that a conservative approach be taken to her tachycardiac  given her history of bradycardia as well. Given lack of symptoms, if monitor shows reasonable control, would be inclined to continue current regimen. No bleeding on Eliquis. Recent labs stable. 2. Essential HTN - as outlined above. She reports it runs normal when she is relaxed. She previously tried to titrate her BP medications in the past (hydralazine) and experienced dizziness, so has maintained on the present regimen overall feeling well. There was commentary in the  past on CT scan that she had possibility of renal artery stenosis, although her BMET would argue against this since Cr is normal on ACEI. Follow conservatively for now. Please monitor your blood pressure occasionally at home. The patient was instructed to monitor their blood pressure at home occasionally while relaxed and to call if tending to run higher than 130/80. 3. Sinus bradycardia - pending monitor as above. 4. Mild mitral regurgitation - no significant murmur on recent exam or symptoms to suggest progression. Follow clinically.   COVID-19 Education: Discussed at last in office visit.  Time:   Today, I have spent 17 minutes with the patient with telehealth technology discussing the above problems.     Medication Adjustments/Labs and Tests Ordered: Current medicines are reviewed at length with the patient today.  Concerns regarding medicines are outlined above.   Disposition:  Follow up in 6 months with Dr. [name], sooner if needed. Also instructed her to touch base with PCP to discuss thyroid function and clarify whether anything else was previously required for the hyperenhancing splenic mass on last year's CT. Not clear if she required further workup for this although had abdominal US in 07/2017 which interestingly did not identify any splenic issues.   Signed, Charlie Pitter, PA-C  09/01/2018 11:31 AM    [place]

## 2018-09-01 ENCOUNTER — Other Ambulatory Visit: Payer: Self-pay

## 2018-09-01 ENCOUNTER — Telehealth (INDEPENDENT_AMBULATORY_CARE_PROVIDER_SITE_OTHER): Payer: Medicare Other | Admitting: Physician Assistant

## 2018-09-01 ENCOUNTER — Encounter: Payer: Self-pay | Admitting: Physician Assistant

## 2018-09-01 VITALS — BP 164/92 | HR 91 | Ht 59.0 in | Wt 92.6 lb

## 2018-09-01 DIAGNOSIS — I4892 Unspecified atrial flutter: Secondary | ICD-10-CM

## 2018-09-01 DIAGNOSIS — I10 Essential (primary) hypertension: Secondary | ICD-10-CM

## 2018-09-01 DIAGNOSIS — I4819 Other persistent atrial fibrillation: Secondary | ICD-10-CM

## 2018-09-01 DIAGNOSIS — I340 Nonrheumatic mitral (valve) insufficiency: Secondary | ICD-10-CM

## 2018-09-01 DIAGNOSIS — R001 Bradycardia, unspecified: Secondary | ICD-10-CM

## 2018-09-01 NOTE — Telephone Encounter (Signed)
This encounter was created in error - please disregard.

## 2018-09-01 NOTE — Patient Instructions (Signed)
Medication Instructions:  Your physician recommends that you continue on your current medications as directed. Please refer to the Current Medication list given to you today.  If you need a refill on your cardiac medications before your next appointment, please call your pharmacy.   Lab work: None ordered  If you have labs (blood work) drawn today and your tests are completely normal, you will receive your results only by: Marland Kitchen MyChart Message (if you have MyChart) OR . A paper copy in the mail If you have any lab test that is abnormal or we need to change your treatment, we will call you to review the results.  Testing/Procedures: None orderd  Follow-Up: At [facility], you and your health needs are our priority.  As part of our continuing mission to provide you with exceptional heart care, we have created designated Provider Care Teams.  These Care Teams include your primary Cardiologist (physician) and Advanced Practice Providers (APPs -  Physician Assistants and Nurse Practitioners) who all work together to provide you with the care you need, when you need it. You will need a follow up appointment in 6 months.  Please call our office 2 months in advance to schedule this appointment.  You may see Ena Dawley, MD or one of the following Advanced Practice Providers on your designated Care Team:   What Cheer, PA-C Melina Copa, PA-C . [name], PA-C  Any Other Special Instructions Will Be Listed Below (If Applicable).   We will call you when heart monitor results come in.   Please monitor your blood pressure occasionally at home. Call your doctor if you tend to get readings of greater than 130 on the top number or 80 on the bottom number. To check your blood pressure, choose a time about 3 hours after taking your blood pressure medicines. Remain seated in a chair for 5 minutes quietly beforehand, then check it.   Follow up with your primary care provider to discuss your  recent thyroid function tests as well as the spleen abnormality seen on your CT scan in June.

## 2018-09-03 ENCOUNTER — Telehealth: Payer: Self-pay

## 2018-09-03 ENCOUNTER — Other Ambulatory Visit: Payer: Self-pay | Admitting: Family Medicine

## 2018-09-03 DIAGNOSIS — I4892 Unspecified atrial flutter: Secondary | ICD-10-CM | POA: Diagnosis not present

## 2018-09-03 DIAGNOSIS — R001 Bradycardia, unspecified: Secondary | ICD-10-CM | POA: Diagnosis not present

## 2018-09-03 DIAGNOSIS — I4819 Other persistent atrial fibrillation: Secondary | ICD-10-CM | POA: Diagnosis not present

## 2018-09-03 NOTE — Telephone Encounter (Signed)
TSH was elevated on 08/15/2018 at cardiologist (labs on Epic). Was advised to follow up with pcp about this.  Daughter Rica Mast calling about this. She will like to know how to proceed.

## 2018-09-03 NOTE — Telephone Encounter (Signed)
Lvm for daughter to be aware patient will need additional labs

## 2018-09-03 NOTE — Telephone Encounter (Signed)
She really needs to do a free T4 so we can gauge if she needs to be treated or not

## 2018-09-09 ENCOUNTER — Telehealth: Payer: Self-pay | Admitting: *Deleted

## 2018-09-09 NOTE — Telephone Encounter (Signed)
-----   Message from [name] [name], [place] sent at 09/08/2018  8:48 AM EDT ----- Please let patient know monitor showed atrial flutter (which we know she has) and average HR overall controlled, with some variation during the day - no change in management at this time. Thanks!

## 2018-09-09 NOTE — Telephone Encounter (Signed)
Call placed to pt re: monitor results, left a message for pt to call back.

## 2018-09-11 ENCOUNTER — Telehealth: Payer: Self-pay | Admitting: General Practice

## 2018-09-11 DIAGNOSIS — I10 Essential (primary) hypertension: Secondary | ICD-10-CM

## 2018-09-11 NOTE — Telephone Encounter (Signed)
Please advise?  Copied from [place] [id number]. Topic: Quick Communication - Lab Results (Clinic Use ONLY) >> Aug 26, 2018 10:32 AM Scherrie Gerlach wrote: Pt had labs at her heart dr and tsh was abnormal.  Pt advised to contact her dr for further advice. Pt would like to know what you have her do? If appt needed, daughter is off work on Fridays.

## 2018-09-11 NOTE — Telephone Encounter (Signed)
Pt daughter, [name], [place] on file, has been made aware of pt's monitor results and verbalized understanding.

## 2018-09-11 NOTE — Telephone Encounter (Signed)
Called and spoke with pt she did not have any concerns for PCP at this time. She has an appt with Dr. Charlett Blake on 09/19/18

## 2018-09-11 NOTE — Telephone Encounter (Signed)
Set her up for a lab visit to repeat the TSH and add a free T4 next Friday and then a phone call with me the week after.

## 2018-09-11 NOTE — Telephone Encounter (Signed)
Copied from [place] [id number]. Topic: General - Inquiry >> Sep 04, 2018  1:02 PM Richardo Priest, NT wrote: Reason for CRM: Patient would like a call back to clarify a few things. Please advise.

## 2018-09-12 NOTE — Telephone Encounter (Signed)
Please schedule lab appt next Friday  Then Virtual call with Charlett Blake the following Friday

## 2018-09-12 NOTE — Addendum Note (Signed)
Addended by: [name] A on: 09/12/2018 04:51 PM   Modules accepted: Orders

## 2018-09-18 ENCOUNTER — Ambulatory Visit: Payer: Medicare Other | Admitting: Family Medicine

## 2018-09-19 ENCOUNTER — Other Ambulatory Visit (INDEPENDENT_AMBULATORY_CARE_PROVIDER_SITE_OTHER): Payer: Medicare Other

## 2018-09-19 ENCOUNTER — Other Ambulatory Visit: Payer: Self-pay

## 2018-09-19 ENCOUNTER — Ambulatory Visit: Payer: Medicare Other | Admitting: Family Medicine

## 2018-09-19 DIAGNOSIS — I10 Essential (primary) hypertension: Secondary | ICD-10-CM

## 2018-09-19 LAB — T4, FREE: Free T4: 0.95 ng/dL (ref 0.60–1.60)

## 2018-09-19 LAB — TSH: TSH: 3.53 u[IU]/mL (ref 0.35–4.50)

## 2018-09-26 ENCOUNTER — Other Ambulatory Visit: Payer: Self-pay | Admitting: Family Medicine

## 2018-09-26 ENCOUNTER — Ambulatory Visit (INDEPENDENT_AMBULATORY_CARE_PROVIDER_SITE_OTHER): Payer: Medicare Other | Admitting: Family Medicine

## 2018-09-26 ENCOUNTER — Other Ambulatory Visit: Payer: Self-pay

## 2018-09-26 VITALS — BP 142/60 | Wt 94.0 lb

## 2018-09-26 DIAGNOSIS — R739 Hyperglycemia, unspecified: Secondary | ICD-10-CM

## 2018-09-26 DIAGNOSIS — I10 Essential (primary) hypertension: Secondary | ICD-10-CM | POA: Diagnosis not present

## 2018-09-26 DIAGNOSIS — E782 Mixed hyperlipidemia: Secondary | ICD-10-CM

## 2018-09-26 DIAGNOSIS — M542 Cervicalgia: Secondary | ICD-10-CM

## 2018-09-26 DIAGNOSIS — R7989 Other specified abnormal findings of blood chemistry: Secondary | ICD-10-CM | POA: Diagnosis not present

## 2018-09-26 DIAGNOSIS — E871 Hypo-osmolality and hyponatremia: Secondary | ICD-10-CM

## 2018-09-26 DIAGNOSIS — R1310 Dysphagia, unspecified: Secondary | ICD-10-CM

## 2018-09-26 DIAGNOSIS — E039 Hypothyroidism, unspecified: Secondary | ICD-10-CM

## 2018-09-28 NOTE — Assessment & Plan Note (Signed)
Monitor vitals weekly and report any concerning symptoms or numbers. no changes to meds. Encouraged heart healthy diet such as the DASH diet and exercise as tolerated.

## 2018-09-28 NOTE — Assessment & Plan Note (Signed)
Elevated TSH and notes some dysphagia and neck pain at times. Will proceed with [place] of thyroid to evaluate

## 2018-09-28 NOTE — Assessment & Plan Note (Signed)
Maintain heart healthy diet, increase exercise, avoid trans fats, consider a krill oil cap daily 

## 2018-09-28 NOTE — Assessment & Plan Note (Signed)
Asymptomatic, will continue to monitor 

## 2018-09-28 NOTE — Assessment & Plan Note (Signed)
hgba1c acceptable, minimize simple carbs. Increase exercise as tolerated.  

## 2018-09-28 NOTE — Progress Notes (Signed)
Virtual Visit via phone Note  I connected with [name] [name] on 9/25/20at  3:00 PM EDT by a phone enabled telemedicine application and verified that I am speaking with the correct person using two identifiers. Princess Eulas Post CMA was able to get patient set up on phone visit after being unable to set up video visit Location: Patient: home Provider: home   I discussed the limitations of evaluation and management by telemedicine and the availability of in person appointments. The patient expressed understanding and agreed to proceed.    Subjective:    Patient ID: [name] [name], female    DOB: October 02, 1924, 85 y.o.   MRN: [id number]  No chief complaint on file.   HPI Patient is in today for follow up on chronic medical concerns including thyroid disease, hypertension, hyperlipidemia and more. No recent febrile illness or hospitalizations. She does endorse infrequent dysphagia but no choking. Also notes some occasional neck pain but no trauma or radiculopathy. Denies CP/palp/SOB/HA/congestion/fevers/GI or GU c/o. Taking meds as prescribed  Past Medical History:  Diagnosis Date  . Anemia   . Anxiety   . Arthritis    hips, knees  . Current use of long term anticoagulation   . Diverticulitis of colon 02/06/2015  . Diverticulosis   . Gait difficulty   . GERD (gastroesophageal reflux disease)   . Headache(784.0) 06/22/2012  . Hyperlipidemia   . Hypertension   . IBS (irritable bowel syndrome)   . Incontinence   . Loss of weight 06/05/2015  . Medicare annual wellness visit, subsequent 02/05/2014  . Melena   . Mild mitral regurgitation   . Mild tricuspid regurgitation   . Mitral and aortic regurgitation   . Mitral valve prolapse    hx of - not seen on recent echoes  . Osteopenia   . Paroxysmal atrial flutter ([place])   . Pedal edema 10/01/2016  . Persistent atrial fibrillation   . Personal history of colonic polyps   . Renal lithiasis 06/26/2015  . Sinus bradycardia   . Thyroid disease    hypo  . Unspecified adverse effect of other drug, medicinal and biological substance(995.29)   . Vitamin B12 deficiency 03/16/2016  . Vitamin D deficiency     Past Surgical History:  Procedure Laterality Date  . BOTOX INJECTION N/A 09/03/2012   Procedure: BOTOX INJECTION;  Surgeon: Inda Castle, MD;  Location: [facility];  Service: Endoscopy;  Laterality: N/A;  . BREAST BIOPSY Left   . BREAST LUMPECTOMY Right 08/18/2014   Procedure: RIGHT BREAST LUMPECTOMY;  Surgeon: Coralie Keens, MD;  Location: [place];  Service: General;  Laterality: Right;  . CARDIAC ELECTROPHYSIOLOGY MAPPING AND ABLATION    . CATARACT EXTRACTION, BILATERAL    . ESOPHAGOGASTRODUODENOSCOPY N/A 09/03/2012   Procedure: ESOPHAGOGASTRODUODENOSCOPY (EGD);  Surgeon: Inda Castle, MD;  Location: [facility];  Service: Endoscopy;  Laterality: N/A;  . ESOPHAGOSCOPY W/ BOTOX INJECTION    . I&D EXTREMITY Right 11/06/2012   Procedure: IRRIGATION AND DEBRIDEMENT EXTREMITY Right Ring Finger;  Surgeon: Tennis Must, MD;  Location: [place];  Service: Orthopedics;  Laterality: Right;  . PARTIAL HYSTERECTOMY     ovaries left in place    Family History  Problem Relation Age of Onset  . Diabetes Mother   . CVA Mother   . Stroke Mother   . COPD Father   . Rheum arthritis Father   . Allergies Daughter   . COPD Daughter   . Stroke Daughter   . COPD Daughter  previous smoker  . Stroke Maternal Grandfather   . Heart disease Maternal Aunt   . Heart disease Maternal Uncle   . Colon cancer Neg Hx   . Colon polyps Neg Hx   . Esophageal cancer Neg Hx   . Gallbladder disease Neg Hx   . Kidney disease Neg Hx   . Heart attack Neg Hx   . Hypertension Neg Hx     Social History   Socioeconomic History  . Marital status: Widowed    Spouse name: Not on file  . Number of children: 5  . Years of education: Not on file  . Highest education level: Not on file  Occupational History  . Occupation: retired     Fish farm manager: RETIRED  Social Needs  . Financial resource strain: Not on file  . Food insecurity    Worry: Not on file    Inability: Not on file  . Transportation needs    Medical: Not on file    Non-medical: Not on file  Tobacco Use  . Smoking status: Never Smoker  . Smokeless tobacco: Never Used  Substance and Sexual Activity  . Alcohol use: No  . Drug use: No  . Sexual activity: Not on file    Comment: lives with Daughter, grandson and his family. no dietary restricitons.  Lifestyle  . Physical activity    Days per week: Not on file    Minutes per session: Not on file  . Stress: Not on file  Relationships  . Social Herbalist on phone: Not on file    Gets together: Not on file    Attends religious service: Not on file    Active member of club or organization: Not on file    Attends meetings of clubs or organizations: Not on file    Relationship status: Not on file  . Intimate partner violence    Fear of current or ex partner: Not on file    Emotionally abused: Not on file    Physically abused: Not on file    Forced sexual activity: Not on file  Other Topics Concern  . Not on file  Social History Narrative  . Not on file    Outpatient Medications Prior to Visit  Medication Sig Dispense Refill  . ALPRAZolam (XANAX) 0.25 MG tablet Take 0.5-1 tablets (0.125-0.25 mg total) by mouth 2 (two) times daily as needed for anxiety. 30 tablet 2  . apixaban (ELIQUIS) 2.5 MG TABS tablet Take 1 tablet (2.5 mg total) by mouth 2 (two) times daily. 60 tablet 5  . azelastine (ASTELIN) 0.1 % nasal spray Place 2 sprays into both nostrils 2 (two) times daily. Use in each nostril as directed 30 mL 5  . Cholecalciferol (VITAMIN D) 2000 UNITS CAPS Take 2,000 Units by mouth every other day.    . Cyanocobalamin (VITAMIN B-12) 500 MCG SUBL Place 1 tablet (500 mcg total) under the tongue daily at 2 PM. 150 tablet   . diclofenac sodium (VOLTAREN) 1 % GEL Apply 2 g topically 4 (four) times  daily. 100 g 1  . famotidine (PEPCID) 20 MG tablet Take 40 mg by mouth daily.    . fluticasone (FLONASE) 50 MCG/ACT nasal spray Place 2 sprays into both nostrils daily. 16 g 1  . hydrALAZINE (APRESOLINE) 10 MG tablet Take 10 mg by mouth 2 (two) times daily.    Marland Kitchen levocetirizine (XYZAL) 5 MG tablet TAKE 1/2 TABLET (2.5 MG TOTAL) BY MOUTH EVERY EVENING. 15 tablet  0  . metoprolol succinate (TOPROL-XL) 50 MG 24 hr tablet Take 1/2 tablet by mouth twice a day with or immediately following a meal. 90 tablet 3  . pantoprazole (PROTONIX) 40 MG tablet TAKE 1 TABLET BY MOUTH EVERY DAY 90 tablet 1  . ramipril (ALTACE) 10 MG capsule TAKE 1 CAPSULE (10 MG TOTAL) BY MOUTH 2 (TWO) TIMES DAILY. 180 capsule 1   No facility-administered medications prior to visit.     Allergies  Allergen Reactions  . Amlodipine Shortness Of Breath  . Darifenacin Hydrobromide     REACTION: causes her dizziness  . Sulfonamide Derivatives Other (See Comments)    Pt thinks it caused dizziness  . Tramadol Other (See Comments)    Insomnia, anorexia    Review of Systems  Constitutional: Negative for fever and malaise/fatigue.  HENT: Negative for congestion.   Eyes: Negative for blurred vision.  Respiratory: Negative for shortness of breath.   Cardiovascular: Negative for chest pain, palpitations and leg swelling.  Gastrointestinal: Negative for abdominal pain, blood in stool, heartburn and nausea.  Genitourinary: Negative for dysuria and frequency.  Musculoskeletal: Positive for neck pain. Negative for falls.  Skin: Negative for rash.  Neurological: Negative for dizziness, loss of consciousness and headaches.  Endo/Heme/Allergies: Negative for environmental allergies.  Psychiatric/Behavioral: Negative for depression. The patient is not nervous/anxious.        Objective:    Physical Exam  Unable to obtain via phone visit.  BP (!) 142/60 (BP Location: Left Arm, Patient Position: Sitting, Cuff Size: Normal)   Wt 94 lb  (42.6 kg)   BMI 18.99 kg/m  Wt Readings from Last 3 Encounters:  09/26/18 94 lb (42.6 kg)  09/01/18 92 lb 9.6 oz (42 kg)  08/15/18 95 lb 12.8 oz (43.5 kg)    Diabetic Foot Exam - Simple   No data filed     Lab Results  Component Value Date   WBC 6.3 08/15/2018   HGB 11.7 08/15/2018   HCT 35.7 08/15/2018   PLT 204 08/15/2018   GLUCOSE 91 08/15/2018   CHOL 182 05/09/2017   TRIG 171.0 (H) 05/09/2017   HDL 39.00 (L) 05/09/2017   LDLDIRECT 72.1 01/19/2009   LDLCALC 109 (H) 05/09/2017   ALT 11 02/21/2018   AST 16 02/21/2018   NA 141 08/15/2018   K 4.3 08/15/2018   CL 101 08/15/2018   CREATININE 0.93 08/15/2018   BUN 11 08/15/2018   CO2 26 08/15/2018   TSH 3.53 09/19/2018   INR 2.1 04/25/2018   HGBA1C 5.6 02/21/2018    Lab Results  Component Value Date   TSH 3.53 09/19/2018   Lab Results  Component Value Date   WBC 6.3 08/15/2018   HGB 11.7 08/15/2018   HCT 35.7 08/15/2018   MCV 94 08/15/2018   PLT 204 08/15/2018   Lab Results  Component Value Date   NA 141 08/15/2018   K 4.3 08/15/2018   CO2 26 08/15/2018   GLUCOSE 91 08/15/2018   BUN 11 08/15/2018   CREATININE 0.93 08/15/2018   BILITOT 0.4 02/21/2018   ALKPHOS 15 (L) 02/21/2018   AST 16 02/21/2018   ALT 11 02/21/2018   PROT 6.7 02/21/2018   ALBUMIN 3.9 02/21/2018   CALCIUM 9.5 08/15/2018   ANIONGAP 9 07/02/2017   GFR 54.34 (L) 02/21/2018   Lab Results  Component Value Date   CHOL 182 05/09/2017   Lab Results  Component Value Date   HDL 39.00 (L) 05/09/2017   Lab Results  Component Value Date   LDLCALC 109 (H) 05/09/2017   Lab Results  Component Value Date   TRIG 171.0 (H) 05/09/2017   Lab Results  Component Value Date   CHOLHDL 5 05/09/2017   Lab Results  Component Value Date   HGBA1C 5.6 02/21/2018       Assessment & Plan:   Problem List Items Addressed This Visit    Essential hypertension (Chronic)    Monitor vitals weekly and report any concerning symptoms or numbers. no  changes to meds. Encouraged heart healthy diet such as the DASH diet and exercise as tolerated.       Hypothyroidism    Elevated TSH and notes some dysphagia and neck pain at times. Will proceed with [place] of thyroid to evaluate      Hyperlipidemia, mixed    Maintain heart healthy diet, increase exercise, avoid trans fats, consider a krill oil cap daily      Hyperglycemia    hgba1c acceptable, minimize simple carbs. Increase exercise as tolerated.       Hyponatremia    Asymptomatic, will continue to monitor        Other Visit Diagnoses    Elevated TSH    -  Primary   Relevant Orders   US THYROID   Dysphagia, unspecified type       Relevant Orders   US THYROID   Neck pain       Relevant Orders   US THYROID      I am having [name] [name] maintain her Vitamin D, Vitamin B-12, azelastine, fluticasone, ALPRAZolam, apixaban, diclofenac sodium, pantoprazole, famotidine, hydrALAZINE, metoprolol succinate, ramipril, and levocetirizine.  No orders of the defined types were placed in this encounter.    I discussed the assessment and treatment plan with the patient. The patient was provided an opportunity to ask questions and all were answered. The patient agreed with the plan and demonstrated an understanding of the instructions.   The patient was advised to call back or seek an in-person evaluation if the symptoms worsen or if the condition fails to improve as anticipated.  I provided 25 minutes of non-face-to-face time during this encounter.   Penni Homans, MD

## 2018-10-03 ENCOUNTER — Ambulatory Visit (HOSPITAL_BASED_OUTPATIENT_CLINIC_OR_DEPARTMENT_OTHER)
Admission: RE | Admit: 2018-10-03 | Discharge: 2018-10-03 | Disposition: A | Discharge status: Home / Self Care | Payer: Medicare Other | Source: Ambulatory Visit | Attending: Family Medicine | Admitting: Family Medicine

## 2018-10-03 ENCOUNTER — Other Ambulatory Visit: Payer: Self-pay

## 2018-10-03 DIAGNOSIS — M542 Cervicalgia: Secondary | ICD-10-CM | POA: Insufficient documentation

## 2018-10-03 DIAGNOSIS — R7989 Other specified abnormal findings of blood chemistry: Secondary | ICD-10-CM | POA: Diagnosis not present

## 2018-10-03 DIAGNOSIS — E041 Nontoxic single thyroid nodule: Secondary | ICD-10-CM

## 2018-10-03 DIAGNOSIS — R1310 Dysphagia, unspecified: Secondary | ICD-10-CM | POA: Insufficient documentation

## 2018-10-03 DIAGNOSIS — E042 Nontoxic multinodular goiter: Secondary | ICD-10-CM | POA: Diagnosis not present

## 2018-10-11 ENCOUNTER — Other Ambulatory Visit: Payer: Self-pay | Admitting: Cardiology

## 2018-10-13 NOTE — Telephone Encounter (Signed)
Pt last saw Melina Copa, PA on 09/01/18, last labs 08/15/18 Creat 0.93, age 85, weight 42.6kg, based on specified criteria pt is on appropriate dosage of Eliquis 2.5mg  BID.  Will refill rx.

## 2018-10-17 IMAGING — DX DG CHEST 2V
2 series · 2 of 2 positions shown · non-contrast
Comparison: CT 06/20/2017, plain film 12/10/2016

CLINICAL DATA: [AGE] female with a history of cough and
dysphagia/globus sensation

EXAM:
CHEST - 2 VIEW

[chest pa]
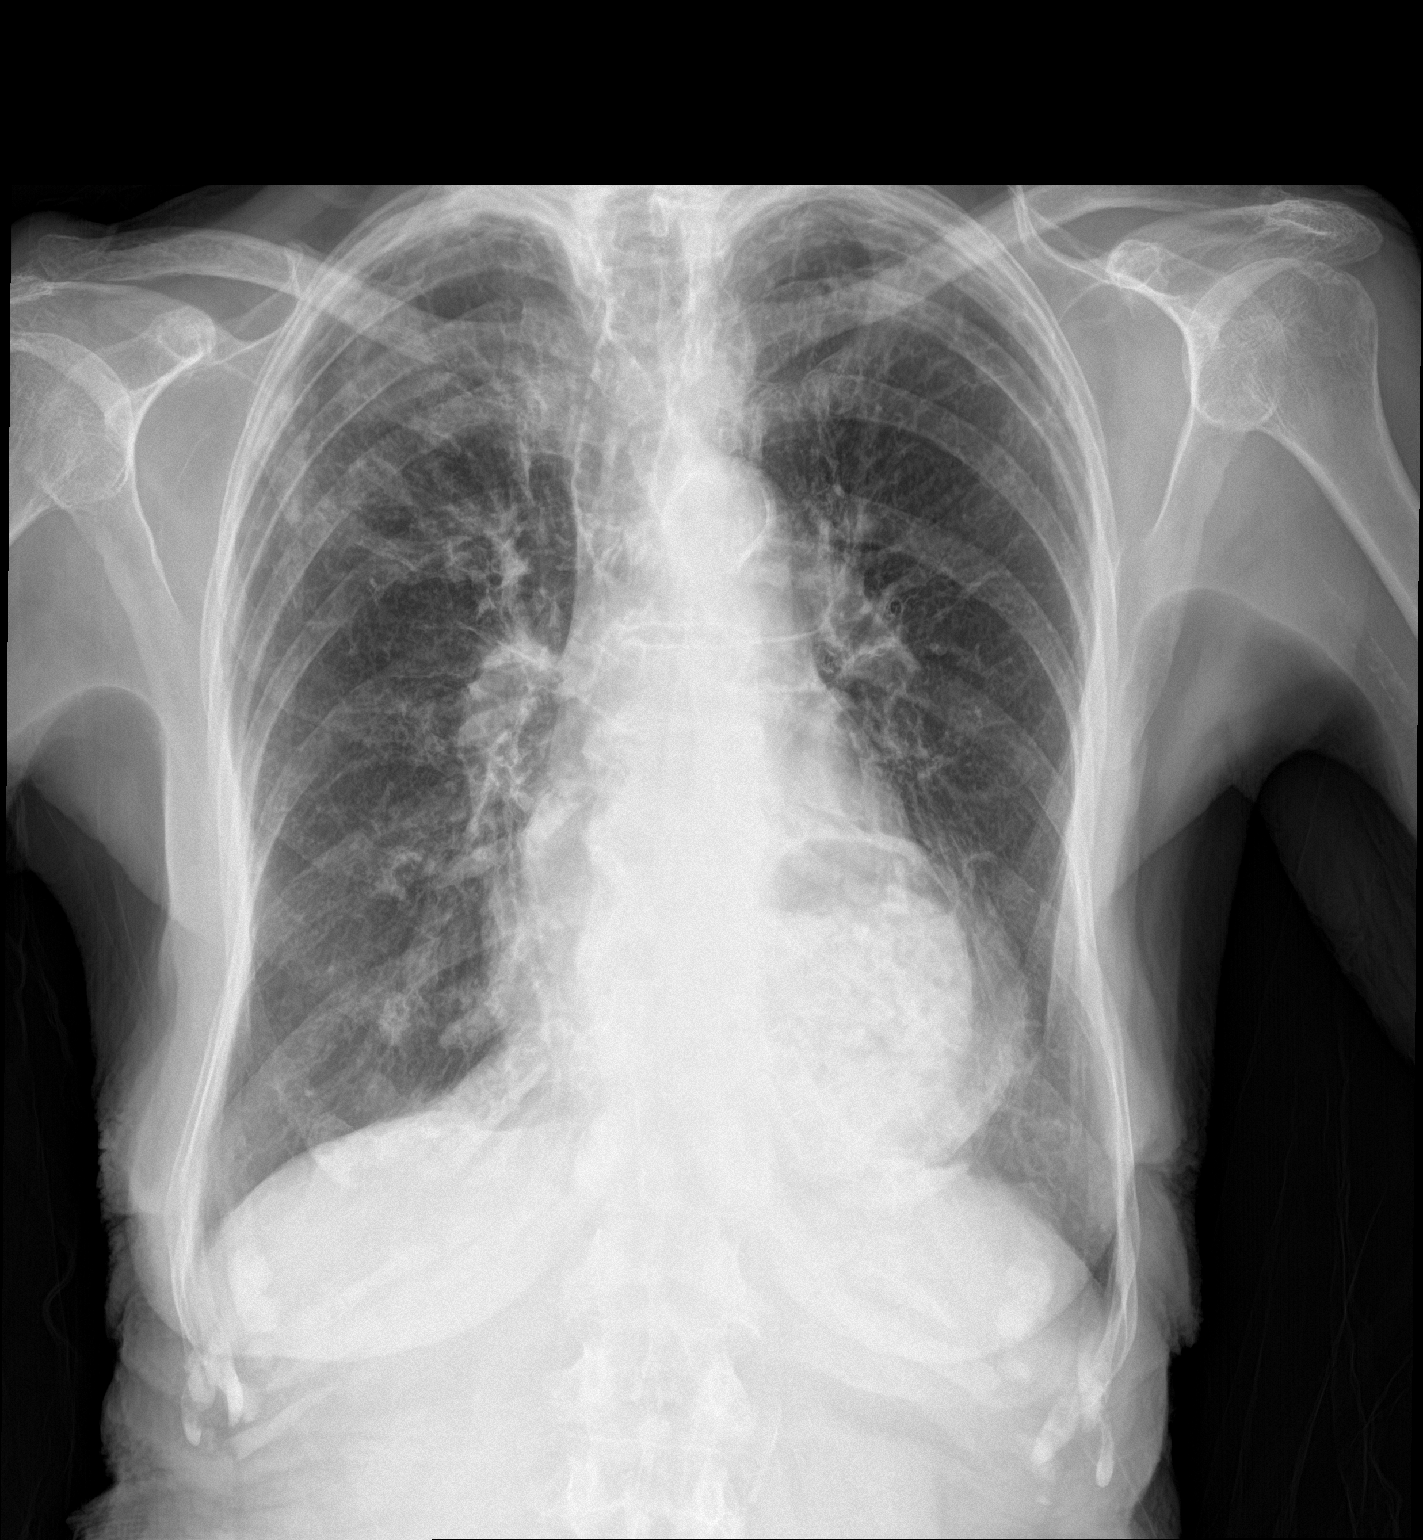

[chest lat]
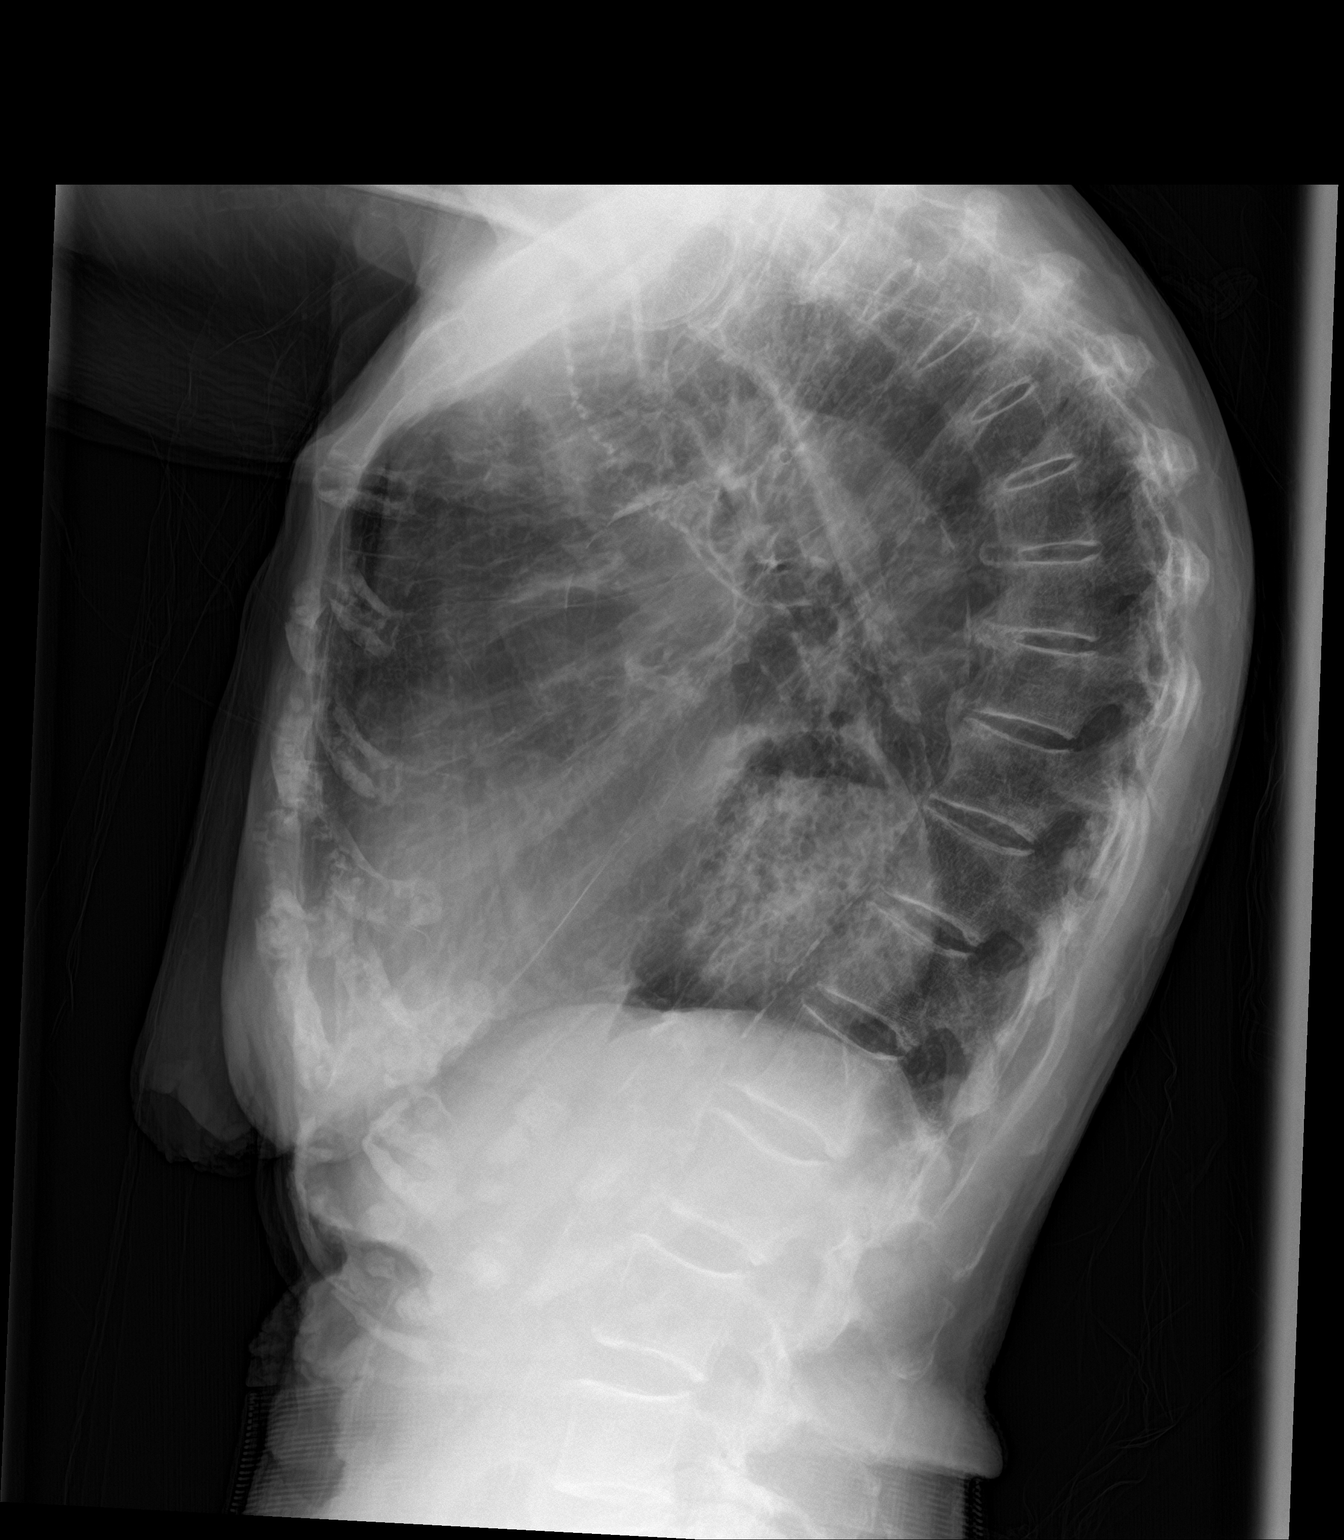

[2 of 2 positions shown; findings below may reference images not displayed]

FINDINGS: Cardiomediastinal silhouette unchanged in size and contour.

Coarsened interstitial markings throughout the lungs, most prominent
at the superior lungs.

No pleural effusion. No pneumothorax. No new confluent airspace
disease.

Stigmata of emphysema, with increased retrosternal airspace,
flattened hemidiaphragms, increased AP diameter, and hyperinflation
on the AP view.

Double density overlying the lower mediastinum, with flocculent
material associated with the posterior mediastinum, compatible with
findings on prior study.

No displaced fracture.  Degenerative changes of the spine.
IMPRESSION: Chronic lung changes and emphysema without evidence of acute
cardiopulmonary disease.

Given the findings on prior CT and the persistent flocculent
material in the posterior mediastinum, this is favored to represent
an epiphrenic diverticulum, potentially source of the patient's
symptoms. Referral for gastroenterology or alternatively thoracic
surgery consultation recommended.

## 2018-10-23 ENCOUNTER — Other Ambulatory Visit: Payer: Self-pay | Admitting: Family Medicine

## 2018-10-29 ENCOUNTER — Telehealth: Payer: Self-pay | Admitting: Gastroenterology

## 2018-10-29 ENCOUNTER — Encounter: Payer: Self-pay | Admitting: Gastroenterology

## 2018-10-29 ENCOUNTER — Ambulatory Visit: Payer: Medicare Other | Admitting: Gastroenterology

## 2018-10-29 VITALS — BP 142/78 | HR 100 | Temp 97.9°F | Ht 59.0 in | Wt 92.5 lb

## 2018-10-29 DIAGNOSIS — Z7901 Long term (current) use of anticoagulants: Secondary | ICD-10-CM | POA: Diagnosis not present

## 2018-10-29 DIAGNOSIS — R1310 Dysphagia, unspecified: Secondary | ICD-10-CM

## 2018-10-29 DIAGNOSIS — I4819 Other persistent atrial fibrillation: Secondary | ICD-10-CM

## 2018-10-29 DIAGNOSIS — R634 Abnormal weight loss: Secondary | ICD-10-CM

## 2018-10-29 DIAGNOSIS — K220 Achalasia of cardia: Secondary | ICD-10-CM | POA: Diagnosis not present

## 2018-10-29 DIAGNOSIS — R1319 Other dysphagia: Secondary | ICD-10-CM | POA: Insufficient documentation

## 2018-10-29 NOTE — Telephone Encounter (Signed)
Request for surgical clearance:     Endoscopy Procedure  What type of surgery is being performed?     Endo with Botox Injection    When is this surgery scheduled?     12/02/18  What type of clearance is required ?   Pharmacy & General Cardiac Clearance   Are there any medications that need to be held prior to surgery and how long? Eliquis x2 days prior to procedure  Practice name and name of physician performing surgery?      [facility]  What is your office phone and fax number?      Phone- [phone]  [phone]  Anesthesia type (None, local, MAC, general) ?       MAC

## 2018-10-29 NOTE — Patient Instructions (Signed)
If you are age 83 or older, your body mass index should be between 23-30. Your Body mass index is 18.68 kg/m. If this is out of the aforementioned range listed, please consider follow up with your Primary Care Provider.  If you are age 46 or younger, your body mass index should be between 19-25. Your Body mass index is 18.68 kg/m. If this is out of the aformentioned range listed, please consider follow up with your Primary Care Provider.   You have been scheduled for an endoscopy. Please follow written instructions given to you at your visit today. If you use inhalers (even only as needed), please bring them with you on the day of your procedure.  We will contact you regarding holding your Eliquis prior to your procedure once we have heard back from Dr. [name].   Thank you for choosing me and [facility].  Janett Billow Zehr-PA

## 2018-10-29 NOTE — Progress Notes (Signed)
____________________________________________________________  Attending physician addendum:  Thank you for sending this case to me. I have reviewed the entire note, and the outlined plan seems appropriate.  Case reviewed.Prior EGD with large distal esophageal diverticulum with retained food that partially obscured visualization. Please have our office staff instruct patient to have low fiber several days prior to procedure (like we do before colonoscopy), and only light diet AM before procedure, then full liquid diet only for remainder of that day (inculding boost/ensure to make sure she gets some protein calories that day).  NPO after MN per usual.  Wilfrid Lund, MD  ____________________________________________________________

## 2018-10-29 NOTE — Telephone Encounter (Signed)
Clinical pharmacist to review eliquis 

## 2018-10-29 NOTE — Progress Notes (Signed)
10/29/2018 [name] [name] [id number] [birth year]   HISTORY OF PRESENT ILLNESS: This is a very pleasant 85 year old female who is a patient of Dr. Corena Pilgrim.  She has history of esophageal diverticulum and achalasia for which she underwent an upper endoscopy with Botox injection of the LES in 2014.  She then saw Dr. Loletha Carrow in 01/2017 for complaints of atypical chest pain, dysphagia, and GERD, but deferred endoscopy at that time as her weight was holding stable.  She presents here again today with the same complaints of dysphagia and reflux.  She is able to eat very little and feels like it just sits in her chest.  She has lost 8 pounds since she was seen here last, 6 of them being since February of this year.   Past Medical History:  Diagnosis Date  . Anemia   . Anxiety   . Arthritis    hips, knees  . Current use of long term anticoagulation   . Diverticulitis of colon 02/06/2015  . Diverticulosis   . Gait difficulty   . GERD (gastroesophageal reflux disease)   . Headache(784.0) 06/22/2012  . Hyperlipidemia   . Hypertension   . IBS (irritable bowel syndrome)   . Incontinence   . Loss of weight 06/05/2015  . Medicare annual wellness visit, subsequent 02/05/2014  . Melena   . Mild mitral regurgitation   . Mild tricuspid regurgitation   . Mitral and aortic regurgitation   . Mitral valve prolapse    hx of - not seen on recent echoes  . Osteopenia   . Paroxysmal atrial flutter ([place])   . Pedal edema 10/01/2016  . Persistent atrial fibrillation ([place])   . Personal history of colonic polyps   . Renal lithiasis 06/26/2015  . Sinus bradycardia   . Thyroid disease    hypo  . Unspecified adverse effect of other drug, medicinal and biological substance(995.29)   . Vitamin B12 deficiency 03/16/2016  . Vitamin D deficiency    Past Surgical History:  Procedure Laterality Date  . BOTOX INJECTION N/A 09/03/2012   Procedure: BOTOX INJECTION;  Surgeon: Inda Castle, MD;  Location: [facility];   Service: Endoscopy;  Laterality: N/A;  . BREAST BIOPSY Left   . BREAST LUMPECTOMY Right 08/18/2014   Procedure: RIGHT BREAST LUMPECTOMY;  Surgeon: Coralie Keens, MD;  Location: [facility];  Service: General;  Laterality: Right;  . CARDIAC ELECTROPHYSIOLOGY MAPPING AND ABLATION    . CATARACT EXTRACTION, BILATERAL    . ESOPHAGOGASTRODUODENOSCOPY N/A 09/03/2012   Procedure: ESOPHAGOGASTRODUODENOSCOPY (EGD);  Surgeon: Inda Castle, MD;  Location: [facility];  Service: Endoscopy;  Laterality: N/A;  . ESOPHAGOSCOPY W/ BOTOX INJECTION    . I&D EXTREMITY Right 11/06/2012   Procedure: IRRIGATION AND DEBRIDEMENT EXTREMITY Right Ring Finger;  Surgeon: Tennis Must, MD;  Location: [facility];  Service: Orthopedics;  Laterality: Right;  . PARTIAL HYSTERECTOMY     ovaries left in place    reports that she has never smoked. She has never used smokeless tobacco. She reports that she does not drink alcohol or use drugs. family history includes Allergies in her daughter; COPD in her daughter, daughter, and father; CVA in her mother; Diabetes in her mother; Heart disease in her maternal aunt and maternal uncle; Rheum arthritis in her father; Stroke in her daughter, maternal grandfather, and mother. Allergies  Allergen Reactions  . Amlodipine Shortness Of Breath  . Darifenacin Hydrobromide     REACTION: causes her dizziness  . Sulfonamide  Derivatives Other (See Comments)    Pt thinks it caused dizziness  . Tramadol Other (See Comments)    Insomnia, anorexia      Outpatient Encounter Medications as of 10/29/2018  Medication Sig  . ALPRAZolam (XANAX) 0.25 MG tablet Take 0.5-1 tablets (0.125-0.25 mg total) by mouth 2 (two) times daily as needed for anxiety.  Marland Kitchen azelastine (ASTELIN) 0.1 % nasal spray Place 2 sprays into both nostrils 2 (two) times daily. Use in each nostril as directed  . Cholecalciferol (VITAMIN D) 2000 UNITS CAPS Take 2,000 Units by mouth every other day.  . Cyanocobalamin  (VITAMIN B-12) 500 MCG SUBL Place 1 tablet (500 mcg total) under the tongue daily at 2 PM.  . diclofenac sodium (VOLTAREN) 1 % GEL Apply 2 g topically 4 (four) times daily.  Marland Kitchen ELIQUIS 2.5 MG TABS tablet TAKE 1 TABLET BY MOUTH TWICE A DAY  . famotidine (PEPCID) 20 MG tablet Take 40 mg by mouth daily.  . fluticasone (FLONASE) 50 MCG/ACT nasal spray Place 2 sprays into both nostrils daily.  . hydrALAZINE (APRESOLINE) 10 MG tablet Take 10 mg by mouth 2 (two) times daily.  Marland Kitchen levocetirizine (XYZAL) 5 MG tablet TAKE 1/2 TABLET (2.5 MG TOTAL) BY MOUTH EVERY EVENING.  . metoprolol succinate (TOPROL-XL) 50 MG 24 hr tablet Take 1/2 tablet by mouth twice a day with or immediately following a meal.  . pantoprazole (PROTONIX) 40 MG tablet TAKE 1 TABLET BY MOUTH EVERY DAY  . ramipril (ALTACE) 10 MG capsule TAKE 1 CAPSULE (10 MG TOTAL) BY MOUTH 2 (TWO) TIMES DAILY.   No facility-administered encounter medications on file as of 10/29/2018.      REVIEW OF SYSTEMS  : All other systems reviewed and negative except where noted in the History of Present Illness.   PHYSICAL EXAM: BP (!) 142/78 (BP Location: Left Arm, Patient Position: Sitting, Cuff Size: Normal)   Pulse 100   Temp 97.9 F (36.6 C) (Oral)   Ht 4\' 11"  (1.499 m)   Wt 92 lb 8 oz (42 kg)   BMI 18.68 kg/m  General:  Small, thin, white female in no acute distress Head: Normocephalic and atraumatic Eyes:  Sclerae anicteric, conjunctiva pink. Ears: Normal auditory acuity Lungs: Clear throughout to auscultation; no increased WOB. Heart: Regular rate and rhythm; no M/R/G. Abdomen: Soft, non-distended.  BS present.  Non-tender. Musculoskeletal: Symmetrical with no gross deformities  Skin: No lesions on visible extremities Extremities: No edema  Neurological: Alert oriented x 4, grossly non-focal Psychological:  Alert and cooperative. Normal mood and affect  ASSESSMENT AND PLAN: *85 year old female with dysphagia, GERD with history of  esophageal diverticulum and achalasia.  Had Botox injection several years ago.  Declined repeat EGD in January 2019, but at this point she is able to eat very little and is continuing to lose weight.  We rediscussed the possibility of upper endoscopy with Botox injections.  Certainly is at increased risk due to her age and underlying cardiac condition requiring anticoagulation.  She thinks that she needs to proceed at this point due to her ongoing struggles in addition to her weight loss.  Will schedule with Dr. Loletha Carrow at [facility]. *Chronic anticoagulation with Eliquis due to history of atrial fibrillation:  Will hold Eliquis for 2 days prior to endoscopic procedures - will instruct when and how to resume after procedure. Benefits and risks of procedure explained including risks of bleeding, perforation, infection, missed lesions, reactions to medications and possible need for hospitalization and surgery for  complications. Additional rare but real risk of stroke or other vascular clotting events off of Eliquis also explained and need to seek urgent help if any signs of these problems occur. Will communicate by phone or EMR with patient's prescribing provider, Dr. [name], to confirm that holding Eliquis is reasonable in this case.    CC:  Mosie Lukes, MD

## 2018-10-29 NOTE — Telephone Encounter (Signed)
Patient with diagnosis of Afib on Eliquis 2.5 mg BID for anticoagulation.    Procedure: Endoscopy Date of procedure: 12/02/18  CHADS2-VASc score of 4 (HTN, AGEx2, female)  CrCl 30 ml/min  Patient can hold Eliquis for 2 days prior to procedure as requested

## 2018-10-29 NOTE — Telephone Encounter (Signed)
   Primary Cardiologist: Ena Dawley, MD  Chart reviewed as part of pre-operative protocol coverage. Patient was contacted 10/29/2018 in reference to pre-operative risk assessment for pending surgery as outlined below.  [name] [name] was last seen on 08/22/2018 via virtual visit by Melina Copa PA-C.  Since that day, [name] [name] has done well without chest pain or worsening dyspnea. She still has occasional tachy-palpitation. Recent heart monitor shows heart rate well controlled on metoprolol.  Therefore, based on ACC/AHA guidelines, the patient would be at acceptable risk for the planned procedure without further cardiovascular testing.   I will route this recommendation to the requesting party via Epic fax function and remove from pre-op pool.  Please call with questions. See our clinical pharmacist's recommendation regarding Eliquis.  [place], [place] 10/29/2018, 2:27 PM

## 2018-11-03 NOTE — Telephone Encounter (Signed)
Pt informed that we recd the okay for her to hold Eliquis x2 days prior her procedure. Pt voiced understanding.

## 2018-11-15 ENCOUNTER — Other Ambulatory Visit: Payer: Self-pay | Admitting: Family Medicine

## 2018-11-18 ENCOUNTER — Other Ambulatory Visit: Payer: Self-pay | Admitting: Family Medicine

## 2018-11-18 DIAGNOSIS — I10 Essential (primary) hypertension: Secondary | ICD-10-CM

## 2018-11-28 ENCOUNTER — Other Ambulatory Visit (HOSPITAL_COMMUNITY)
Admission: RE | Admit: 2018-11-28 | Discharge: 2018-11-28 | Disposition: A | Discharge status: Home / Self Care | Payer: Medicare Other | Source: Ambulatory Visit | Attending: Gastroenterology | Admitting: Gastroenterology

## 2018-11-28 DIAGNOSIS — Z01812 Encounter for preprocedural laboratory examination: Secondary | ICD-10-CM | POA: Diagnosis not present

## 2018-11-28 DIAGNOSIS — Z20828 Contact with and (suspected) exposure to other viral communicable diseases: Secondary | ICD-10-CM | POA: Insufficient documentation

## 2018-11-28 LAB — SARS CORONAVIRUS 2 (TAT 6-24 HRS): SARS Coronavirus 2: NEGATIVE

## 2018-12-02 ENCOUNTER — Ambulatory Visit (HOSPITAL_COMMUNITY)
Admission: RE | Admit: 2018-12-02 | Discharge: 2018-12-02 | Disposition: A | Discharge status: Home / Self Care | Payer: Medicare Other | Attending: Gastroenterology | Admitting: Gastroenterology

## 2018-12-02 ENCOUNTER — Encounter (HOSPITAL_COMMUNITY)
Admission: RE | Disposition: A | Discharge status: Home / Self Care | Payer: Self-pay | Source: Home / Self Care | Attending: Gastroenterology

## 2018-12-02 ENCOUNTER — Ambulatory Visit (HOSPITAL_COMMUNITY): Payer: Medicare Other | Admitting: Certified Registered Nurse Anesthetist

## 2018-12-02 ENCOUNTER — Other Ambulatory Visit: Payer: Self-pay

## 2018-12-02 ENCOUNTER — Encounter (HOSPITAL_COMMUNITY): Payer: Self-pay | Admitting: *Deleted

## 2018-12-02 DIAGNOSIS — M8580 Other specified disorders of bone density and structure, unspecified site: Secondary | ICD-10-CM | POA: Insufficient documentation

## 2018-12-02 DIAGNOSIS — I129 Hypertensive chronic kidney disease with stage 1 through stage 4 chronic kidney disease, or unspecified chronic kidney disease: Secondary | ICD-10-CM | POA: Insufficient documentation

## 2018-12-02 DIAGNOSIS — R634 Abnormal weight loss: Secondary | ICD-10-CM | POA: Diagnosis present

## 2018-12-02 DIAGNOSIS — E039 Hypothyroidism, unspecified: Secondary | ICD-10-CM | POA: Diagnosis not present

## 2018-12-02 DIAGNOSIS — Z885 Allergy status to narcotic agent status: Secondary | ICD-10-CM | POA: Insufficient documentation

## 2018-12-02 DIAGNOSIS — Q399 Congenital malformation of esophagus, unspecified: Secondary | ICD-10-CM | POA: Insufficient documentation

## 2018-12-02 DIAGNOSIS — Z7901 Long term (current) use of anticoagulants: Secondary | ICD-10-CM | POA: Diagnosis not present

## 2018-12-02 DIAGNOSIS — Z8719 Personal history of other diseases of the digestive system: Secondary | ICD-10-CM | POA: Diagnosis not present

## 2018-12-02 DIAGNOSIS — I4819 Other persistent atrial fibrillation: Secondary | ICD-10-CM | POA: Diagnosis not present

## 2018-12-02 DIAGNOSIS — Z888 Allergy status to other drugs, medicaments and biological substances status: Secondary | ICD-10-CM | POA: Insufficient documentation

## 2018-12-02 DIAGNOSIS — R1314 Dysphagia, pharyngoesophageal phase: Secondary | ICD-10-CM

## 2018-12-02 DIAGNOSIS — Q396 Congenital diverticulum of esophagus: Secondary | ICD-10-CM | POA: Diagnosis not present

## 2018-12-02 DIAGNOSIS — K225 Diverticulum of esophagus, acquired: Secondary | ICD-10-CM | POA: Diagnosis not present

## 2018-12-02 DIAGNOSIS — K449 Diaphragmatic hernia without obstruction or gangrene: Secondary | ICD-10-CM | POA: Diagnosis not present

## 2018-12-02 DIAGNOSIS — N189 Chronic kidney disease, unspecified: Secondary | ICD-10-CM | POA: Diagnosis not present

## 2018-12-02 DIAGNOSIS — R1310 Dysphagia, unspecified: Secondary | ICD-10-CM

## 2018-12-02 DIAGNOSIS — I341 Nonrheumatic mitral (valve) prolapse: Secondary | ICD-10-CM | POA: Insufficient documentation

## 2018-12-02 DIAGNOSIS — M160 Bilateral primary osteoarthritis of hip: Secondary | ICD-10-CM | POA: Diagnosis not present

## 2018-12-02 DIAGNOSIS — R1319 Other dysphagia: Secondary | ICD-10-CM

## 2018-12-02 DIAGNOSIS — K219 Gastro-esophageal reflux disease without esophagitis: Secondary | ICD-10-CM | POA: Diagnosis not present

## 2018-12-02 DIAGNOSIS — K220 Achalasia of cardia: Secondary | ICD-10-CM

## 2018-12-02 DIAGNOSIS — M170 Bilateral primary osteoarthritis of knee: Secondary | ICD-10-CM | POA: Diagnosis not present

## 2018-12-02 DIAGNOSIS — Z882 Allergy status to sulfonamides status: Secondary | ICD-10-CM | POA: Diagnosis not present

## 2018-12-02 HISTORY — PX: ESOPHAGOGASTRODUODENOSCOPY (EGD) WITH PROPOFOL: SHX5813

## 2018-12-02 SURGERY — ESOPHAGOGASTRODUODENOSCOPY (EGD) WITH PROPOFOL
Anesthesia: Monitor Anesthesia Care

## 2018-12-02 MED ORDER — PROPOFOL 500 MG/50ML IV EMUL
INTRAVENOUS | Status: DC | PRN
  Administered 2018-12-02: 100 ug/kg/min via INTRAVENOUS

## 2018-12-02 MED ORDER — SODIUM CHLORIDE (PF) 0.9 % IJ SOLN
100.0000 [IU] | Freq: Once | INTRAMUSCULAR | Status: DC
  Filled 2018-12-02: qty 100

## 2018-12-02 MED ORDER — LACTATED RINGERS IV SOLN
INTRAVENOUS | Status: DC
  Administered 2018-12-02: 11:00:00 1000 mL via INTRAVENOUS

## 2018-12-02 MED ORDER — SODIUM CHLORIDE (PF) 0.9 % IJ SOLN
INTRAMUSCULAR | Status: AC
  Filled 2018-12-02: qty 10

## 2018-12-02 MED ORDER — SODIUM CHLORIDE 0.9 % IV SOLN: INTRAVENOUS | Status: DC

## 2018-12-02 MED ORDER — PROPOFOL 500 MG/50ML IV EMUL
INTRAVENOUS | Status: AC
  Filled 2018-12-02: qty 50

## 2018-12-02 MED ORDER — ONABOTULINUMTOXINA 100 UNITS IJ SOLR
INTRAMUSCULAR | Status: AC
  Filled 2018-12-02: qty 100

## 2018-12-02 SURGICAL SUPPLY — 15 items

## 2018-12-02 NOTE — H&P (Signed)
History:  This patient presents for endoscopic testing for dysphagia - achalasia (see recent GI office note for details).  Gladys Damme Referring physician: Mosie Lukes, MD  Past Medical History: Past Medical History:  Diagnosis Date  . Anemia   . Anxiety   . Arthritis    hips, knees  . Current use of long term anticoagulation   . Diverticulitis of colon 02/06/2015  . Diverticulosis   . Gait difficulty   . GERD (gastroesophageal reflux disease)   . Headache(784.0) 06/22/2012  . Hyperlipidemia   . Hypertension   . IBS (irritable bowel syndrome)   . Incontinence   . Loss of weight 06/05/2015  . Medicare annual wellness visit, subsequent 02/05/2014  . Melena   . Mild mitral regurgitation   . Mild tricuspid regurgitation   . Mitral and aortic regurgitation   . Mitral valve prolapse    hx of - not seen on recent echoes  . Osteopenia   . Paroxysmal atrial flutter ([place])   . Pedal edema 10/01/2016  . Persistent atrial fibrillation ([place])   . Personal history of colonic polyps   . Renal lithiasis 06/26/2015  . Sinus bradycardia   . Thyroid disease    hypo  . Unspecified adverse effect of other drug, medicinal and biological substance(995.29)   . Vitamin B12 deficiency 03/16/2016  . Vitamin D deficiency      Past Surgical History: Past Surgical History:  Procedure Laterality Date  . BOTOX INJECTION N/A 09/03/2012   Procedure: BOTOX INJECTION;  Surgeon: Inda Castle, MD;  [facility]: [facility];  Service: Endoscopy;  Laterality: N/A;  . BREAST BIOPSY Left   . BREAST LUMPECTOMY Right 08/18/2014   Procedure: RIGHT BREAST LUMPECTOMY;  Surgeon: Coralie Keens, MD;  Location: [facility];  Service: General;  Laterality: Right;  . CARDIAC ELECTROPHYSIOLOGY MAPPING AND ABLATION    . CATARACT EXTRACTION, BILATERAL    . ESOPHAGOGASTRODUODENOSCOPY N/A 09/03/2012   Procedure: ESOPHAGOGASTRODUODENOSCOPY (EGD);  Surgeon: Inda Castle, MD;  Location: [facility];   Service: Endoscopy;  Laterality: N/A;  . ESOPHAGOSCOPY W/ BOTOX INJECTION    . I&D EXTREMITY Right 11/06/2012   Procedure: IRRIGATION AND DEBRIDEMENT EXTREMITY Right Ring Finger;  Surgeon: Tennis Must, MD;  Location: [facility];  Service: Orthopedics;  Laterality: Right;  . PARTIAL HYSTERECTOMY     ovaries left in place    Allergies: Allergies  Allergen Reactions  . Amlodipine Shortness Of Breath  . Darifenacin Hydrobromide     REACTION: causes her dizziness  . Sulfonamide Derivatives Other (See Comments)    Pt thinks it caused dizziness  . Tramadol Other (See Comments)    Insomnia, anorexia    Outpatient Meds: Current Facility-Administered Medications  Medication Dose Route Frequency Provider Last Rate Last Dose  . 0.9 %  sodium chloride infusion   Intravenous Continuous Zehr, Jessica D, PA-C      . botox 100 units/NS 4 mL for final conc. 25 units/mL mixture  100 Units Submucosal Once Danis, Estill Cotta III, MD      . lactated ringers infusion   Intravenous Continuous Danis, Estill Cotta III, MD          ___________________________________________________________________ Objective   Exam:  BP (!) 164/83   Temp 97.7 F (36.5 C) (Oral)   Resp 16   SpO2 98%    CV: RRR without murmur, S1/S2, no JVD, no peripheral edema  Resp: clear to auscultation bilaterally, normal RR and effort noted  GI: soft, no  tenderness, with active bowel sounds. No guarding or palpable organomegaly noted.  Neuro: awake, alert and oriented x 3. Normal gross motor function and fluent speech   Assessment:  Dysphagia with weight loss - achalasia  Plan:  EGD with BoTox of LES   [name] [name]

## 2018-12-02 NOTE — Op Note (Signed)
[facility] Patient Name: Shade Rivenbark Procedure Date: 12/02/2018 MRN: [id number] Attending MD: Estill Cotta. Loletha Carrow , MD Date of Birth: September 05, 1924 CSN: [id number] Age: 85 Admit Type: Outpatient Procedure:                Upper GI endoscopy Indications:              Esophageal dysphagia (2014 EGD reported large                            distal esophageal diverticulum and findings c/w                            achalasia, for which BoTox was performed). Patient                            with progressive dysphagia and weight loss Providers:                Mallie Mussel L. Loletha Carrow, MD, Grace Isaac, RN, Benay Pillow, RN, William Dalton, Technician, Janeece Agee, Technician Referring MD:             Willette Alma, MD Medicines:                Monitored Anesthesia Care Complications:            No immediate complications. Estimated Blood Loss:     Estimated blood loss: none. Procedure:                Pre-Anesthesia Assessment:                           - Prior to the procedure, a History and Physical                            was performed, and patient medications and                            allergies were reviewed. The patient's tolerance of                            previous anesthesia was also reviewed. The risks                            and benefits of the procedure and the sedation                            options and risks were discussed with the patient.                            All questions were answered, and informed consent  was obtained. Prior Anticoagulants: The patient has                            taken Eliquis (apixaban), last dose was 2 days                            prior to procedure. ASA Grade Assessment: III - A                            patient with severe systemic disease. After                            reviewing the risks and benefits, the patient was                             deemed in satisfactory condition to undergo the                            procedure.                           After obtaining informed consent, the endoscope was                            passed under direct vision. Throughout the                            procedure, the patient's blood pressure, pulse, and                            oxygen saturations were monitored continuously. The                            [id number]-H190 ([id number]) Olympus gastroscope was                            introduced through the mouth, and advanced to the                            second part of duodenum. The upper GI endoscopy was                            performed with difficulty due to abnormal anatomy.                            The patient tolerated the procedure well. Scope In: Scope Out: Findings:      A very large diverticulum was found in the lower third of the esophagus,       and was full of semi-solid food, little of which could be suctioned away.      The distal esophagus was significantly tortuous. However, there was no       resistance passing scope through EGJ to suggest achalasia. No Botox was       performed as it was not technically feasible and  not expected to improve       symptoms.      A 3 cm hiatal hernia was present.      The stomach was normal.      The cardia and gastric fundus were normal on retroflexion.      The examined duodenum was normal. Impression:               - Very large diverticulum in the lower third of the                            esophagus containing food. This is causing the                            patient's progressive dysphagia and resultant                            weight loss.                           - Tortuous distal esophagus.                           - 3 cm hiatal hernia.                           - Normal stomach.                           - Normal examined duodenum.                           - No specimens collected. Moderate  Sedation:      MAC sedation used Recommendation:           - Patient has a contact number available for                            emergencies. The signs and symptoms of potential                            delayed complications were discussed with the                            patient. Return to normal activities tomorrow.                            Written discharge instructions were provided to the                            patient.                           - Pureed diet indefinitely. Remain upright after                            eating as much as possible, especially in the  evening.                           - Resume Eliquis (apixaban) at prior dose today.                           - Head of bed elevated to decrease chance of                            aspiration. Procedure Code(s):        --- Professional ---                           [phone], Esophagogastroduodenoscopy, flexible,                            transoral; diagnostic, including collection of                            specimen(s) by brushing or washing, when performed                            (separate procedure) Diagnosis Code(s):        --- Professional ---                           Q39.6, Congenital diverticulum of esophagus                           Q39.9, Congenital malformation of esophagus,                            unspecified                           K44.9, Diaphragmatic hernia without obstruction or                            gangrene                           R13.14, Dysphagia, pharyngoesophageal phase CPT copyright 2019 American Medical Association. All rights reserved. The codes documented in this report are preliminary and upon coder review may  be revised to meet current compliance requirements. Garry Nicolini L. Loletha Carrow, MD 12/02/2018 12:37:41 PM This report has been signed electronically. Number of Addenda: 0

## 2018-12-02 NOTE — Transfer of Care (Signed)
Immediate Anesthesia Transfer of Care Note  Patient: [name] [name]  Procedure(s) Performed: ESOPHAGOGASTRODUODENOSCOPY (EGD) WITH PROPOFOL (N/A )  Patient Location: PACU and Endoscopy Unit  Anesthesia Type:MAC  Level of Consciousness: awake, alert , oriented and patient cooperative  Airway & Oxygen Therapy: Patient Spontanous Breathing and Patient connected to face mask oxygen  Post-op Assessment: Report given to RN, Post -op Vital signs reviewed and stable and Patient moving all extremities  Post vital signs: Reviewed and stable  Last Vitals:  Vitals Value Taken Time  BP 148/72 12/02/18 1226  Temp    Pulse 83 12/02/18 1227  Resp 20 12/02/18 1227  SpO2 100 % 12/02/18 1227  Vitals shown include unvalidated device data.  Last Pain:  Vitals:   12/02/18 1054  TempSrc: Oral  PainSc: 0-No pain         Complications: No apparent anesthesia complications

## 2018-12-02 NOTE — Discharge Instructions (Signed)
YOU HAD AN ENDOSCOPIC PROCEDURE TODAY: Refer to the procedure report and other information in the discharge instructions given to you for any specific questions about what was found during the examination. If this information does not answer your questions, please call [place] office at [phone] to clarify.   YOU SHOULD EXPECT: Some feelings of bloating in the abdomen. Passage of more gas than usual. Walking can help get rid of the air that was put into your GI tract during the procedure and reduce the bloating. If you had a lower endoscopy (such as a colonoscopy or flexible sigmoidoscopy) you may notice spotting of blood in your stool or on the toilet paper. Some abdominal soreness may be present for a day or two, also.  DIET: Your first meal following the procedure should be a light meal and then it is ok to progress to your normal diet. A half-sandwich or bowl of soup is an example of a good first meal. Heavy or fried foods are harder to digest and may make you feel nauseous or bloated. Drink plenty of fluids but you should avoid alcoholic beverages for 24 hours. If you had a esophageal dilation, please see attached instructions for diet.    ACTIVITY: Your care partner should take you home directly after the procedure. You should plan to take it easy, moving slowly for the rest of the day. You can resume normal activity the day after the procedure however YOU SHOULD NOT DRIVE, use power tools, machinery or perform tasks that involve climbing or major physical exertion for 24 hours (because of the sedation medicines used during the test).   SYMPTOMS TO REPORT IMMEDIATELY: A gastroenterologist can be reached at any hour. Please call [phone]  for any of the following symptoms:  Following lower endoscopy (colonoscopy, flexible sigmoidoscopy) Excessive amounts of blood in the stool  Significant tenderness, worsening of abdominal pains  Swelling of the abdomen that is new, acute  Fever of 100 or  higher  Following upper endoscopy (EGD, EUS, ERCP, esophageal dilation) Vomiting of blood or coffee ground material  New, significant abdominal pain  New, significant chest pain or pain under the shoulder blades  Painful or persistently difficult swallowing  New shortness of breath  Black, tarry-looking or red, bloody stools  FOLLOW UP:  If any biopsies were taken you will be contacted by phone or by letter within the next 1-3 weeks. Call [phone]  if you have not heard about the biopsies in 3 weeks.  Please also call with any specific questions about appointments or follow up tests. Resume Eliquis at your regular dose today.  You must remain on a puree consistency diet indefinitely. ____________________________________________________________

## 2018-12-02 NOTE — Anesthesia Preprocedure Evaluation (Signed)
Anesthesia Evaluation   Patient awake    Reviewed: Allergy & Precautions, NPO status , Patient's Chart, lab work & pertinent test results  Airway Mallampati: I       Dental  (+) Edentulous Upper, Edentulous Lower   Pulmonary    breath sounds clear to auscultation       Cardiovascular hypertension, Pt. on medications + dysrhythmias Atrial Fibrillation  Rhythm:Irregular     Neuro/Psych Anxiety    GI/Hepatic GERD  Medicated and Poorly Controlled,  Endo/Other  Hypothyroidism   Renal/GU CRFRenal disease  negative genitourinary   Musculoskeletal   Abdominal   Peds negative pediatric ROS (+)  Hematology   Anesthesia Other Findings   Reproductive/Obstetrics                             Anesthesia Physical  Anesthesia Plan  ASA: III  Anesthesia Plan: MAC   Post-op Pain Management:    Induction: Intravenous  PONV Risk Score and Plan:   Airway Management Planned: Nasal Cannula, Simple Face Mask and Mask  Additional Equipment:   Intra-op Plan:   Post-operative Plan: Extubation in OR  Informed Consent: I have reviewed the patients History and Physical, chart, labs and discussed the procedure including the risks, benefits and alternatives for the proposed anesthesia with the patient or authorized representative who has indicated his/her understanding and acceptance.     Dental advisory given  Plan Discussed with: CRNA, Anesthesiologist and Surgeon  Anesthesia Plan Comments:         Anesthesia Quick Evaluation

## 2018-12-02 NOTE — Interval H&P Note (Signed)
History and Physical Interval Note:  12/02/2018 11:00 AM  [name] [name]  has presented today for surgery, with the diagnosis of dysphagia, GERD, H/o achalasia, wt loss,.  The various methods of treatment have been discussed with the patient and family. After consideration of risks, benefits and other options for treatment, the patient has consented to  Procedure(s): ESOPHAGOGASTRODUODENOSCOPY (EGD) WITH PROPOFOL (N/A) BOTOX INJECTION (N/A) as a surgical intervention.  The patient's history has been reviewed, patient examined, no change in status, stable for surgery.  I have reviewed the patient's chart and labs.  Questions were answered to the patient's satisfaction.     Nelida Meuse III

## 2018-12-03 ENCOUNTER — Encounter (HOSPITAL_COMMUNITY): Payer: Self-pay | Admitting: Gastroenterology

## 2018-12-03 NOTE — Anesthesia Postprocedure Evaluation (Signed)
Anesthesia Post Note  Patient: [name] [name]  Procedure(s) Performed: ESOPHAGOGASTRODUODENOSCOPY (EGD) WITH PROPOFOL (N/A )     Patient location during evaluation: PACU Anesthesia Type: MAC Level of consciousness: awake and alert Pain management: pain level controlled Vital Signs Assessment: post-procedure vital signs reviewed and stable Respiratory status: spontaneous breathing, nonlabored ventilation, respiratory function stable and patient connected to nasal cannula oxygen Cardiovascular status: stable and blood pressure returned to baseline Postop Assessment: no apparent nausea or vomiting Anesthetic complications: no    Last Vitals:  Vitals:   12/02/18 1240 12/02/18 1250  BP: (!) 166/46 (!) 168/68  Pulse: 61 (!) 175  Resp: (!) 25 19  Temp:    SpO2: 100% 99%    Last Pain:  Vitals:   12/02/18 1250  TempSrc:   PainSc: 0-No pain   Pain Goal:                   Tareva Leske

## 2018-12-10 ENCOUNTER — Other Ambulatory Visit: Payer: Self-pay | Admitting: Family Medicine

## 2019-01-07 ENCOUNTER — Other Ambulatory Visit: Payer: Self-pay | Admitting: Family Medicine

## 2019-01-07 DIAGNOSIS — G4700 Insomnia, unspecified: Secondary | ICD-10-CM

## 2019-01-08 NOTE — Telephone Encounter (Signed)
Requesting:xanax Contract:yes UDS:n/a Last OV:09/26/18 Next OV:n/a Last Refill:02/21/18  #[id number] Database:   Please advise

## 2019-01-10 ENCOUNTER — Other Ambulatory Visit: Payer: Self-pay | Admitting: Family Medicine

## 2019-01-10 DIAGNOSIS — I10 Essential (primary) hypertension: Secondary | ICD-10-CM

## 2019-02-04 ENCOUNTER — Other Ambulatory Visit: Payer: Self-pay | Admitting: Family Medicine

## 2019-02-04 NOTE — Telephone Encounter (Signed)
Pantoprazole refill sent to pharmacy. Pt last OV 09/26/18 and was advised to follow up in 2 months. Sent mychart message to schedule OV soon.

## 2019-02-06 ENCOUNTER — Telehealth: Payer: Self-pay | Admitting: Cardiology

## 2019-02-06 NOTE — Telephone Encounter (Signed)
Patient's daughter, Izora Gala, states that she needs to come with her mother to her appt on Monday with Dr. [name]. She states that her other sister normally comes with her mother but this time she will be coming.

## 2019-02-06 NOTE — Telephone Encounter (Signed)
Spoke with Izora Gala the pt daughter, and she needs to accompany the pt to her appt with Dr. [name] on 02/09/19 at 1000, due to pt having difficulty ambulating by herself.  Informed Izora Gala that she can accompany the pt and both should wear their mask to the appt and during the entire duration of their appt. Izora Gala verbalized understanding and agrees with this plan. Updated this in appt notes.

## 2019-02-08 NOTE — Progress Notes (Signed)
Cardiology Office Note    Date:  02/09/2019   ID:  [name] [name] [birth year], MRN [id number]  PCP:  Mosie Lukes, MD  Cardiologist:  Ena Dawley, MD  Electrophysiologist:  None   Chief Complaint: f/u a-fib, 6 months follow up  History of Present Illness:   [name] [name] is a 86 y.o. female with history of persistent atrial fibrillation, prior atrial flutter as well, essential hypertension, sinus bradycardia, hyperlipidemia, anemia, anxiety, arthritis, diverticulosis, GERD, thyroid disease and low body weight who presents for 6 month f/u. She has been felt to have had chronic atrial fib however EKGs in 2018 and 2019 showed sinus bradycardia. Last echo in 2018 showed EF 55-60%, mild MR, moderate LAE, mild TR, normal PASP. She was seen in the ER 06/20/17 with discomfort in chest, upper abdomen and back. CT angio chest/abd/pelvis showed no acute dissection/aneurysm, + atherosclerotic vascular disease with suspected renal artery stenosis, possible 16 mmhyperenhancing splenic mass, sigmoid diverticular disease -> OP w/u of splenic mass recommended. She was then re-hospitalized with weakness and decreased appetite found to be hyponatremic and slightly bradycardic.  She was treated for possible SIADH. Initially the patient was bradycardic and her beta-blocker was held but heart rate increased to greater than 100. She was seen in consultation by Dr. Marlou Porch who felt that her rhythm was actually atrial flutter appearing. He advised continued rate control on low-dose Toprol and watch for bradycardia. At last f/u 02/2018 she was felt to be doing well overall. Coumadin was switched to Eliquis during begininng of Covid pandemic. Last labs in 02/2018 showed normal TSH, K 4.2, Cr 0.96, normal LFTs, A1C 5.6, Hgb 11.4.  02/09/2019 - the patient is coming after 6 months, she underwent event monitoring in 09/2018 with findings of persistent atrial flutter with variable block with minimum HR 45, maximum HR 136  and average HR 65 BPM. 1 episode of ventricular tachycardia lasting 8 beats.We continued the same management medical management.  She states that her major problem was is esophageal stricture and diverticulum with fluid retention for which she had to undergo upper endoscopy.  Because of her age they were not able to perform surgery and she is now on pured food only.  She has lost significant amount of weight.  She denies any chest pain shortness of breath dizziness or syncope.  She has sometimes episodes of light orthostatic hypotension when she gets up she has never fallen.  She had one episode of fall when she tripped in her bathroom.  She did not hit anything.  She denies any bleeding.  Past Medical History:  Diagnosis Date  . Anemia   . Anxiety   . Arthritis    hips, knees  . Current use of long term anticoagulation   . Diverticulitis of colon 02/06/2015  . Diverticulosis   . Gait difficulty   . GERD (gastroesophageal reflux disease)   . Headache(784.0) 06/22/2012  . Hyperlipidemia   . Hypertension   . IBS (irritable bowel syndrome)   . Incontinence   . Loss of weight 06/05/2015  . Medicare annual wellness visit, subsequent 02/05/2014  . Melena   . Mild mitral regurgitation   . Mild tricuspid regurgitation   . Mitral and aortic regurgitation   . Mitral valve prolapse    hx of - not seen on recent echoes  . Osteopenia   . Paroxysmal atrial flutter ([place])   . Pedal edema 10/01/2016  . Persistent atrial fibrillation ([place])   . Personal history  of colonic polyps   . Renal lithiasis 06/26/2015  . Sinus bradycardia   . Thyroid disease    hypo  . Unspecified adverse effect of other drug, medicinal and biological substance(995.29)   . Vitamin B12 deficiency 03/16/2016  . Vitamin D deficiency     Past Surgical History:  Procedure Laterality Date  . BOTOX INJECTION N/A 09/03/2012   Procedure: BOTOX INJECTION;  Surgeon: Inda Castle, MD;  Location: [facility];  Service: Endoscopy;   Laterality: N/A;  . BREAST BIOPSY Left   . BREAST LUMPECTOMY Right 08/18/2014   Procedure: RIGHT BREAST LUMPECTOMY;  Surgeon: Coralie Keens, MD;  Location: [facility];  Service: General;  Laterality: Right;  . CARDIAC ELECTROPHYSIOLOGY MAPPING AND ABLATION    . CATARACT EXTRACTION, BILATERAL    . ESOPHAGOGASTRODUODENOSCOPY N/A 09/03/2012   Procedure: ESOPHAGOGASTRODUODENOSCOPY (EGD);  Surgeon: Inda Castle, MD;  Location: [facility];  Service: Endoscopy;  Laterality: N/A;  . ESOPHAGOGASTRODUODENOSCOPY (EGD) WITH PROPOFOL N/A 12/02/2018   Procedure: ESOPHAGOGASTRODUODENOSCOPY (EGD) WITH PROPOFOL;  Surgeon: [name], MD;  Location: [facility];  Service: Gastroenterology;  Laterality: N/A;  . ESOPHAGOSCOPY W/ BOTOX INJECTION    . I & D EXTREMITY Right 11/06/2012   Procedure: IRRIGATION AND DEBRIDEMENT EXTREMITY Right Ring Finger;  Surgeon: Tennis Must, MD;  Location: [facility];  Service: Orthopedics;  Laterality: Right;  . PARTIAL HYSTERECTOMY     ovaries left in place    Current Medications: Current Meds  Medication Sig  . ALPRAZolam (XANAX) 0.25 MG tablet TAKE 0.5-1 TABLETS (0.125-0.25 MG TOTAL) BY MOUTH 2 (TWO) TIMES DAILY AS NEEDED FOR ANXIETY.  Marland Kitchen apixaban (ELIQUIS) 2.5 MG TABS tablet Take 2.5 mg by mouth 2 (two) times daily.  . Cholecalciferol (VITAMIN D) 2000 UNITS CAPS Take 2,000 Units by mouth every other day.  . Cyanocobalamin (VITAMIN B-12) 500 MCG SUBL Place 1 tablet (500 mcg total) under the tongue daily at 2 PM.  . famotidine (PEPCID) 20 MG tablet Take 40 mg by mouth at bedtime.   . hydrALAZINE (APRESOLINE) 10 MG tablet Take 10 mg by mouth 2 (two) times daily.  Marland Kitchen levocetirizine (XYZAL) 5 MG tablet TAKE 1/2 TABLET (2.5 MG TOTAL) BY MOUTH EVERY EVENING.  . metoprolol succinate (TOPROL-XL) 50 MG 24 hr tablet Take 1/2 tablet by mouth twice a day with or immediately following a meal. (Patient taking differently: Take 25 mg by mouth 2 (two) times daily. )    . pantoprazole (PROTONIX) 40 MG tablet TAKE 1 TABLET BY MOUTH EVERY DAY  . ramipril (ALTACE) 10 MG capsule TAKE 1 CAPSULE (10 MG TOTAL) BY MOUTH 2 (TWO) TIMES DAILY.     Allergies:   Amlodipine, Darifenacin hydrobromide, Sulfonamide derivatives, and Tramadol   Social History   Socioeconomic History  . Marital status: Widowed    Spouse name: Not on file  . Number of children: 5  . Years of education: Not on file  . Highest education level: Not on file  Occupational History  . Occupation: retired    Fish farm manager: RETIRED  Tobacco Use  . Smoking status: Never Smoker  . Smokeless tobacco: Never Used  Substance and Sexual Activity  . Alcohol use: No  . Drug use: No  . Sexual activity: Not on file    Comment: lives with Daughter, grandson and his family. no dietary restricitons.  Other Topics Concern  . Not on file  Social History Narrative  . Not on file   Social Determinants of Health  Financial Resource Strain:   . Difficulty of Paying Living Expenses: Not on file  Food Insecurity:   . Worried About Charity fundraiser in the Last Year: Not on file  . Ran Out of Food in the Last Year: Not on file  Transportation Needs:   . Lack of Transportation (Medical): Not on file  . Lack of Transportation (Non-Medical): Not on file  Physical Activity:   . Days of Exercise per Week: Not on file  . Minutes of Exercise per Session: Not on file  Stress:   . Feeling of Stress : Not on file  Social Connections:   . Frequency of Communication with Friends and Family: Not on file  . Frequency of Social Gatherings with Friends and Family: Not on file  . Attends Religious Services: Not on file  . Active Member of Clubs or Organizations: Not on file  . Attends Archivist Meetings: Not on file  . Marital Status: Not on file     Family History:  The patient's family history includes Allergies in her daughter; COPD in her daughter, daughter, and father; CVA in her mother; Diabetes  in her mother; Heart disease in her maternal aunt and maternal uncle; Rheum arthritis in her father; Stroke in her daughter, maternal grandfather, and mother. There is no history of Colon cancer, Colon polyps, Esophageal cancer, Gallbladder disease, Kidney disease, Heart attack, or Hypertension.  ROS:   Please see the history of present illness. + recent rx for UTI. All other systems are reviewed and otherwise negative.    EKGs/Labs/Other Studies Reviewed:    Studies reviewed were summarized above.   EKG:  EKG is ordered today.  The EKG ordered today demonstrates likely atrial flutter 2:1 HR 122bpm with diffuse ST sagging II, III, avF, V3-V6 (ST changes similar to prior) - less likely sinus tach   Recent Labs: 02/21/2018: ALT 11 08/15/2018: BUN 11; Creatinine, Ser 0.93; Hemoglobin 11.7; Magnesium 1.9; Platelets 204; Potassium 4.3; Sodium 141 09/19/2018: TSH 3.53  Recent Lipid Panel    Component Value Date/Time   CHOL 182 05/09/2017 1310   TRIG 171.0 (H) 05/09/2017 1310   HDL 39.00 (L) 05/09/2017 1310   CHOLHDL 5 05/09/2017 1310   VLDL 34.2 05/09/2017 1310   LDLCALC 109 (H) 05/09/2017 1310   LDLDIRECT 72.1 01/19/2009 1026    PHYSICAL EXAM:    VS:  BP 124/82   Pulse (!) 103   Ht 4\' 11"  (1.499 m)   Wt 92 lb 9.6 oz (42 kg)   SpO2 95%   BMI 18.70 kg/m   BMI: Body mass index is 18.7 kg/m.  GEN: Well developed petite elderly WF, in no acute distress HEENT: normocephalic, atraumatic Neck: no JVD, carotid bruits, or masses Cardiac: Reg rhythm, tachycardic; no murmurs, rubs, or gallops, no edema  Respiratory:  clear to auscultation bilaterally, normal work of breathing GI: soft, nontender, nondistended, + BS MS:  kyphotic posture Skin: warm and dry, no rash Neuro:  Alert and Oriented x 3, Strength and sensation are intact, follows commands Psych: euthymic mood, full affect  Wt Readings from Last 3 Encounters:  02/09/19 92 lb 9.6 oz (42 kg)  10/29/18 92 lb 8 oz (42 kg)    09/26/18 94 lb (42.6 kg)     ASSESSMENT & PLAN:   1. Atrial flutter, also with history of persistent AF -she underwent Zio patch monitoring in November 2020 with ventricular rates ranging from 45-1 36, will continue the same management, she has no  bleeding with Eliquis and most recent hemoglobin was 11.4.   2. Essential HTN -well controlled, at home in 130s to 140 range, she states that sometimes she feels slightly lightheaded when she gets up but no falls. 3. Mild mitral regurgitation - no significant murmur on recent exam or symptoms to suggest progression. Follow clinically.  Disposition: F/u in 6 months.  Medication Adjustments/Labs and Tests Ordered: Current medicines are reviewed at length with the patient today.  Concerns regarding medicines are outlined above. Medication changes, Labs and Tests ordered today are summarized above and listed in the Patient Instructions accessible in Encounters.   Signed, Ena Dawley, MD  02/09/2019 10:33 AM    [facility] [address], [place], [place]  [address] Phone: [phone]; Fax: [phone]

## 2019-02-09 ENCOUNTER — Ambulatory Visit: Payer: Medicare Other | Admitting: Cardiology

## 2019-02-09 ENCOUNTER — Other Ambulatory Visit: Payer: Self-pay

## 2019-02-09 ENCOUNTER — Encounter: Payer: Self-pay | Admitting: Cardiology

## 2019-02-09 VITALS — BP 124/82 | HR 103 | Ht 59.0 in | Wt 92.6 lb

## 2019-02-09 DIAGNOSIS — I4892 Unspecified atrial flutter: Secondary | ICD-10-CM

## 2019-02-09 DIAGNOSIS — I4819 Other persistent atrial fibrillation: Secondary | ICD-10-CM | POA: Diagnosis not present

## 2019-02-09 NOTE — Patient Instructions (Signed)
Medication Instructions:   Your physician recommends that you continue on your current medications as directed. Please refer to the Current Medication list given to you today.  *If you need a refill on your cardiac medications before your next appointment, please call your pharmacy*    Follow-Up: At [facility], you and your health needs are our priority.  As part of our continuing mission to provide you with exceptional heart care, we have created designated Provider Care Teams.  These Care Teams include your primary Cardiologist (physician) and Advanced Practice Providers (APPs -  Physician Assistants and Nurse Practitioners) who all work together to provide you with the care you need, when you need it.  Your next appointment:   6 month(s)  The format for your next appointment:   In Person  Provider:   Katarina Nelson, MD    

## 2019-02-18 ENCOUNTER — Other Ambulatory Visit: Payer: Self-pay | Admitting: Family Medicine

## 2019-03-09 ENCOUNTER — Other Ambulatory Visit: Payer: Self-pay | Admitting: Family Medicine

## 2019-03-09 DIAGNOSIS — I10 Essential (primary) hypertension: Secondary | ICD-10-CM

## 2019-03-11 ENCOUNTER — Other Ambulatory Visit: Payer: Self-pay | Admitting: Family Medicine

## 2019-04-10 ENCOUNTER — Other Ambulatory Visit: Payer: Self-pay | Admitting: Cardiology

## 2019-04-10 NOTE — Telephone Encounter (Signed)
Eliquis 2.5mg  refill request received. Patient is 86 years old, weight-42kg, Crea-0.93 on 08/15/2018, Diagnosis-Aflutter and Afib, and last seen by Dr. [name] on 02/09/2019. Dose is appropriate based on dosing criteria. Will send in refill to requested pharmacy.

## 2019-05-09 ENCOUNTER — Other Ambulatory Visit: Payer: Self-pay | Admitting: Family Medicine

## 2019-06-08 ENCOUNTER — Telehealth: Payer: Self-pay | Admitting: Family Medicine

## 2019-06-08 NOTE — Progress Notes (Signed)
  Chronic Care Management   Note  06/08/2019 Name: [name] [name] MRN: [id number] DOB: 1924/08/26  [name] [name] is a 86 y.o. year old female who is a primary care patient of Mosie Lukes, MD. I reached out to Gladys Damme by phone today in response to a referral sent by Ms. Lauryl P Fournier's PCP, Mosie Lukes, MD.   [name] [name] was given information about Chronic Care Management services today including:  1. CCM service includes personalized support from designated clinical staff supervised by her physician, including individualized plan of care and coordination with other care providers 2. 24/7 contact phone numbers for assistance for urgent and routine care needs. 3. Service will only be billed when office clinical staff spend 20 minutes or more in a month to coordinate care. 4. Only one practitioner may furnish and bill the service in a calendar month. 5. The patient may stop CCM services at any time (effective at the end of the month) by phone call to the office staff.   Patient agreed to services and verbal consent obtained.   This note is not being shared with the patient for the following reason: To respect privacy (The patient or proxy has requested that the information not be shared).  Follow up plan:   Earney [place] Upstream Scheduler

## 2019-06-11 ENCOUNTER — Encounter: Payer: Self-pay | Admitting: Family Medicine

## 2019-06-11 ENCOUNTER — Ambulatory Visit (INDEPENDENT_AMBULATORY_CARE_PROVIDER_SITE_OTHER): Payer: Medicare Other | Admitting: Family Medicine

## 2019-06-11 ENCOUNTER — Other Ambulatory Visit: Payer: Self-pay

## 2019-06-11 VITALS — BP 140/78 | HR 57 | Temp 97.8°F | Resp 12 | Ht 59.0 in | Wt 93.4 lb

## 2019-06-11 DIAGNOSIS — E559 Vitamin D deficiency, unspecified: Secondary | ICD-10-CM | POA: Diagnosis not present

## 2019-06-11 DIAGNOSIS — E782 Mixed hyperlipidemia: Secondary | ICD-10-CM

## 2019-06-11 DIAGNOSIS — N289 Disorder of kidney and ureter, unspecified: Secondary | ICD-10-CM

## 2019-06-11 DIAGNOSIS — R739 Hyperglycemia, unspecified: Secondary | ICD-10-CM

## 2019-06-11 DIAGNOSIS — K219 Gastro-esophageal reflux disease without esophagitis: Secondary | ICD-10-CM

## 2019-06-11 DIAGNOSIS — E2839 Other primary ovarian failure: Secondary | ICD-10-CM

## 2019-06-11 DIAGNOSIS — Z23 Encounter for immunization: Secondary | ICD-10-CM

## 2019-06-11 DIAGNOSIS — Z0000 Encounter for general adult medical examination without abnormal findings: Secondary | ICD-10-CM

## 2019-06-11 DIAGNOSIS — I4819 Other persistent atrial fibrillation: Secondary | ICD-10-CM

## 2019-06-11 DIAGNOSIS — Z780 Asymptomatic menopausal state: Secondary | ICD-10-CM

## 2019-06-11 DIAGNOSIS — D649 Anemia, unspecified: Secondary | ICD-10-CM | POA: Diagnosis not present

## 2019-06-11 DIAGNOSIS — E538 Deficiency of other specified B group vitamins: Secondary | ICD-10-CM

## 2019-06-11 DIAGNOSIS — E039 Hypothyroidism, unspecified: Secondary | ICD-10-CM

## 2019-06-11 NOTE — Assessment & Plan Note (Signed)
Tolerating statin, encouraged heart healthy diet, avoid trans fats, minimize simple carbs and saturated fats. Increase exercise as tolerated 

## 2019-06-11 NOTE — Patient Instructions (Addendum)
Call dermatology for appointment to have spot on your temple, if your dermatologist does not take your insurance let us know or check with your insurance regarding whom they cover Preventive Care 65 Years and Older, Female Preventive care refers to lifestyle choices and visits with your health care provider that can promote health and wellness. This includes:  A yearly physical exam. This is also called an annual well check.  Regular dental and eye exams.  Immunizations.  Screening for certain conditions.  Healthy lifestyle choices, such as diet and exercise. What can I expect for my preventive care visit? Physical exam Your health care provider will check:  Height and weight. These may be used to calculate body mass index (BMI), which is a measurement that tells if you are at a healthy weight.  Heart rate and blood pressure.  Your skin for abnormal spots. Counseling Your health care provider may ask you questions about:  Alcohol, tobacco, and drug use.  Emotional well-being.  Home and relationship well-being.  Sexual activity.  Eating habits.  History of falls.  Memory and ability to understand (cognition).  Work and work Statistician.  Pregnancy and menstrual history. What immunizations do I need?  Influenza (flu) vaccine  This is recommended every year. Tetanus, diphtheria, and pertussis (Tdap) vaccine  You may need a Td booster every 10 years. Varicella (chickenpox) vaccine  You may need this vaccine if you have not already been vaccinated. Zoster (shingles) vaccine  You may need this after age 2. Pneumococcal conjugate (PCV13) vaccine  One dose is recommended after age 75. Pneumococcal polysaccharide (PPSV23) vaccine  One dose is recommended after age 26. Measles, mumps, and rubella (MMR) vaccine  You may need at least one dose of MMR if you were born in 1957 or later. You may also need a second dose. Meningococcal conjugate (MenACWY)  vaccine  You may need this if you have certain conditions. Hepatitis A vaccine  You may need this if you have certain conditions or if you travel or work in places where you may be exposed to hepatitis A. Hepatitis B vaccine  You may need this if you have certain conditions or if you travel or work in places where you may be exposed to hepatitis B. Haemophilus influenzae type b (Hib) vaccine  You may need this if you have certain conditions. You may receive vaccines as individual doses or as more than one vaccine together in one shot (combination vaccines). Talk with your health care provider about the risks and benefits of combination vaccines. What tests do I need? Blood tests  Lipid and cholesterol levels. These may be checked every 5 years, or more frequently depending on your overall health.  Hepatitis C test.  Hepatitis B test. Screening  Lung cancer screening. You may have this screening every year starting at age 63 if you have a 30-pack-year history of smoking and currently smoke or have quit within the past 15 years.  Colorectal cancer screening. All adults should have this screening starting at age 41 and continuing until age 48. Your health care provider may recommend screening at age 3 if you are at increased risk. You will have tests every 1-10 years, depending on your results and the type of screening test.  Diabetes screening. This is done by checking your blood sugar (glucose) after you have not eaten for a while (fasting). You may have this done every 1-3 years.  Mammogram. This may be done every 1-2 years. Talk with your health care  provider about how often you should have regular mammograms.  BRCA-related cancer screening. This may be done if you have a family history of breast, ovarian, tubal, or peritoneal cancers. Other tests  Sexually transmitted disease (STD) testing.  Bone density scan. This is done to screen for osteoporosis. You may have this done  starting at age 46. Follow these instructions at home: Eating and drinking  Eat a diet that includes fresh fruits and vegetables, whole grains, lean protein, and low-fat dairy products. Limit your intake of foods with high amounts of sugar, saturated fats, and salt.  Take vitamin and mineral supplements as recommended by your health care provider.  Do not drink alcohol if your health care provider tells you not to drink.  If you drink alcohol: ? Limit how much you have to 0-1 drink a day. ? Be aware of how much alcohol is in your drink. In the [place] one drink equals one 12 oz bottle of beer (355 mL), one 5 oz glass of wine (148 mL), or one 1 oz glass of hard liquor (44 mL). Lifestyle  Take daily care of your teeth and gums.  Stay active. Exercise for at least 30 minutes on 5 or more days each week.  Do not use any products that contain nicotine or tobacco, such as cigarettes, e-cigarettes, and chewing tobacco. If you need help quitting, ask your health care provider.  If you are sexually active, practice safe sex. Use a condom or other form of protection in order to prevent STIs (sexually transmitted infections).  Talk with your health care provider about taking a low-dose aspirin or statin. What's next?  Go to your health care provider once a year for a well check visit.  Ask your health care provider how often you should have your eyes and teeth checked.  Stay up to date on all vaccines. This information is not intended to replace advice given to you by your health care provider. Make sure you discuss any questions you have with your health care provider. Document Revised: 12/12/2017 Document Reviewed: 12/12/2017 Elsevier Patient Education  2020 [employer].

## 2019-06-12 ENCOUNTER — Other Ambulatory Visit (INDEPENDENT_AMBULATORY_CARE_PROVIDER_SITE_OTHER): Payer: Medicare Other

## 2019-06-12 ENCOUNTER — Other Ambulatory Visit: Payer: Self-pay | Admitting: Family Medicine

## 2019-06-12 ENCOUNTER — Other Ambulatory Visit: Payer: Self-pay | Admitting: Physician Assistant

## 2019-06-12 DIAGNOSIS — E559 Vitamin D deficiency, unspecified: Secondary | ICD-10-CM

## 2019-06-12 DIAGNOSIS — D649 Anemia, unspecified: Secondary | ICD-10-CM

## 2019-06-12 DIAGNOSIS — E782 Mixed hyperlipidemia: Secondary | ICD-10-CM | POA: Diagnosis not present

## 2019-06-12 DIAGNOSIS — K219 Gastro-esophageal reflux disease without esophagitis: Secondary | ICD-10-CM | POA: Diagnosis not present

## 2019-06-12 DIAGNOSIS — R739 Hyperglycemia, unspecified: Secondary | ICD-10-CM

## 2019-06-12 LAB — LIPID PANEL
Cholesterol: 165 mg/dL (ref 0–200)
HDL: 40.4 mg/dL (ref >39.00)
LDL Cholesterol: 105 mg/dL — ABNORMAL HIGH (ref 0–99)
NonHDL: 124.45
Total CHOL/HDL Ratio: 4
Triglycerides: 98 mg/dL (ref 0.0–149.0)
VLDL: 19.6 mg/dL (ref 0.0–40.0)

## 2019-06-12 LAB — VITAMIN D 25 HYDROXY (VIT D DEFICIENCY, FRACTURES): VITD: 39.5 ng/mL (ref 30.00–100.00)

## 2019-06-12 LAB — CBC
HCT: 34.3 % — ABNORMAL LOW (ref 36.0–46.0)
Hemoglobin: 11.7 g/dL — ABNORMAL LOW (ref 12.0–15.0)
MCHC: 34.2 g/dL (ref 30.0–36.0)
MCV: 91.8 fl (ref 78.0–100.0)
Platelets: 193 10*3/uL (ref 150.0–400.0)
RBC: 3.74 Mil/uL — ABNORMAL LOW (ref 3.87–5.11)
RDW: 14.7 % (ref 11.5–15.5)
WBC: 9.8 10*3/uL (ref 4.0–10.5)

## 2019-06-12 LAB — COMPREHENSIVE METABOLIC PANEL
ALT: 11 U/L (ref 0–35)
AST: 16 U/L (ref 0–37)
Albumin: 3.9 g/dL (ref 3.5–5.2)
Alkaline Phosphatase: 16 U/L — ABNORMAL LOW (ref 39–117)
BUN: 18 mg/dL (ref 6–23)
CO2: 31 mEq/L (ref 19–32)
Calcium: 9.2 mg/dL (ref 8.4–10.5)
Chloride: 100 mEq/L (ref 96–112)
Creatinine, Ser: 0.99 mg/dL (ref 0.40–1.20)
GFR: 52.3 mL/min — ABNORMAL LOW (ref >60.00)
Glucose, Bld: 90 mg/dL (ref 70–99)
Potassium: 4.7 mEq/L (ref 3.5–5.1)
Sodium: 137 mEq/L (ref 135–145)
Total Bilirubin: 0.6 mg/dL (ref 0.2–1.2)
Total Protein: 7.1 g/dL (ref 6.0–8.3)

## 2019-06-12 LAB — TSH: TSH: 3.09 u[IU]/mL (ref 0.35–4.50)

## 2019-06-12 NOTE — Progress Notes (Signed)
Pt here for redraw. Unable to obtain blood at OV. No charge today.

## 2019-06-15 LAB — HEMOGLOBIN A1C: Hgb A1c MFr Bld: 5.7 % (ref 4.6–6.5)

## 2019-06-15 NOTE — Progress Notes (Signed)
Subjective:    Patient ID: [name] [name], female    DOB: Jan 31, 1924, 86 y.o.   MRN: [id number]  Chief Complaint  Patient presents with  . Annual Exam    HPI Patient is in today for annual preventative exam and follow up on chronic medical concerns. No recent febrile illness or hospitalizations. No acute concerns. She does have a worsening lesion on her left temple that she is going to have evaluated by dermatology. She reports she is eating somehwat better but does have a small appetite. Denies CP/palp/SOB/HA/congestion/fevers/GI or GU c/o. Taking meds as prescribed  Past Medical History:  Diagnosis Date  . Anemia   . Anxiety   . Arthritis    hips, knees  . Current use of long term anticoagulation   . Diverticulitis of colon 02/06/2015  . Diverticulosis   . Gait difficulty   . GERD (gastroesophageal reflux disease)   . Headache(784.0) 06/22/2012  . Hyperlipidemia   . Hypertension   . IBS (irritable bowel syndrome)   . Incontinence   . Loss of weight 06/05/2015  . Medicare annual wellness visit, subsequent 02/05/2014  . Melena   . Mild mitral regurgitation   . Mild tricuspid regurgitation   . Mitral and aortic regurgitation   . Mitral valve prolapse    hx of - not seen on recent echoes  . Osteopenia   . Paroxysmal atrial flutter ([place])   . Pedal edema 10/01/2016  . Persistent atrial fibrillation ([place])   . Personal history of colonic polyps   . Renal lithiasis 06/26/2015  . Sinus bradycardia   . Thyroid disease    hypo  . Unspecified adverse effect of other drug, medicinal and biological substance(995.29)   . Vitamin B12 deficiency 03/16/2016  . Vitamin D deficiency     Past Surgical History:  Procedure Laterality Date  . BOTOX INJECTION N/A 09/03/2012   Procedure: BOTOX INJECTION;  Surgeon: Inda Castle, MD;  Location: [facility];  Service: Endoscopy;  Laterality: N/A;  . BREAST BIOPSY Left   . BREAST LUMPECTOMY Right 08/18/2014   Procedure: RIGHT BREAST LUMPECTOMY;   Surgeon: Coralie Keens, MD;  Location: [facility];  Service: General;  Laterality: Right;  . CARDIAC ELECTROPHYSIOLOGY MAPPING AND ABLATION    . CATARACT EXTRACTION, BILATERAL    . ESOPHAGOGASTRODUODENOSCOPY N/A 09/03/2012   Procedure: ESOPHAGOGASTRODUODENOSCOPY (EGD);  Surgeon: Inda Castle, MD;  Location: [facility];  Service: Endoscopy;  Laterality: N/A;  . ESOPHAGOGASTRODUODENOSCOPY (EGD) WITH PROPOFOL N/A 12/02/2018   Procedure: ESOPHAGOGASTRODUODENOSCOPY (EGD) WITH PROPOFOL;  Surgeon: [name], MD;  Location: [facility];  Service: Gastroenterology;  Laterality: N/A;  . ESOPHAGOSCOPY W/ BOTOX INJECTION    . I & D EXTREMITY Right 11/06/2012   Procedure: IRRIGATION AND DEBRIDEMENT EXTREMITY Right Ring Finger;  Surgeon: Tennis Must, MD;  Location: [place];  Service: Orthopedics;  Laterality: Right;  . PARTIAL HYSTERECTOMY     ovaries left in place    Family History  Problem Relation Age of Onset  . Diabetes Mother   . CVA Mother   . Stroke Mother   . COPD Father   . Rheum arthritis Father   . Allergies Daughter   . COPD Daughter   . Stroke Daughter   . COPD Daughter        previous smoker  . Stroke Maternal Grandfather   . Heart disease Maternal Aunt   . Heart disease Maternal Uncle   . Colon cancer Neg Hx   .  Colon polyps Neg Hx   . Esophageal cancer Neg Hx   . Gallbladder disease Neg Hx   . Kidney disease Neg Hx   . Heart attack Neg Hx   . Hypertension Neg Hx     Social History   Socioeconomic History  . Marital status: Widowed    Spouse name: Not on file  . Number of children: 5  . Years of education: Not on file  . Highest education level: Not on file  Occupational History  . Occupation: retired    Fish farm manager: RETIRED  Tobacco Use  . Smoking status: Never Smoker  . Smokeless tobacco: Never Used  Vaping Use  . Vaping Use: Never used  Substance and Sexual Activity  . Alcohol use: No  . Drug use: No  . Sexual activity: Not on  file    Comment: lives with Daughter, grandson and his family. no dietary restricitons.  Other Topics Concern  . Not on file  Social History Narrative  . Not on file   Social Determinants of Health   Financial Resource Strain:   . Difficulty of Paying Living Expenses:   Food Insecurity:   . Worried About Charity fundraiser in the Last Year:   . Arboriculturist in the Last Year:   Transportation Needs:   . Film/video editor (Medical):   Marland Kitchen Lack of Transportation (Non-Medical):   Physical Activity:   . Days of Exercise per Week:   . Minutes of Exercise per Session:   Stress:   . Feeling of Stress :   Social Connections:   . Frequency of Communication with Friends and Family:   . Frequency of Social Gatherings with Friends and Family:   . Attends Religious Services:   . Active Member of Clubs or Organizations:   . Attends Archivist Meetings:   Marland Kitchen Marital Status:   Intimate Partner Violence:   . Fear of Current or Ex-Partner:   . Emotionally Abused:   Marland Kitchen Physically Abused:   . Sexually Abused:     Outpatient Medications Prior to Visit  Medication Sig Dispense Refill  . ALPRAZolam (XANAX) 0.25 MG tablet TAKE 0.5-1 TABLETS (0.125-0.25 MG TOTAL) BY MOUTH 2 (TWO) TIMES DAILY AS NEEDED FOR ANXIETY. 30 tablet 1  . Cholecalciferol (VITAMIN D) 2000 UNITS CAPS Take 2,000 Units by mouth every other day.    . Cyanocobalamin (VITAMIN B-12) 500 MCG SUBL Place 1 tablet (500 mcg total) under the tongue daily at 2 PM. 150 tablet   . ELIQUIS 2.5 MG TABS tablet TAKE 1 TABLET BY MOUTH TWICE A DAY 60 tablet 5  . famotidine (PEPCID) 20 MG tablet Take 40 mg by mouth at bedtime.     . hydrALAZINE (APRESOLINE) 10 MG tablet Take 10 mg by mouth in the morning and at bedtime.    Marland Kitchen levocetirizine (XYZAL) 5 MG tablet TAKE 1/2 TABLET (2.5 MG TOTAL) BY MOUTH EVERY EVENING. 45 tablet 2  . ramipril (ALTACE) 10 MG capsule TAKE 1 CAPSULE (10 MG TOTAL) BY MOUTH 2 (TWO) TIMES DAILY. 180 capsule 1    . metoprolol succinate (TOPROL-XL) 50 MG 24 hr tablet Take 1/2 tablet by mouth twice a day with or immediately following a meal. (Patient taking differently: Take 25 mg by mouth 2 (two) times daily. ) 90 tablet 3  . pantoprazole (PROTONIX) 40 MG tablet TAKE 1 TABLET BY MOUTH EVERY DAY 90 tablet 1  . azelastine (ASTELIN) 0.1 % nasal spray PLACE 2 SPRAYS INTO  BOTH NOSTRILS 2 (TWO) TIMES DAILY AS DIRECTED 30 mL 1  . hydrALAZINE (APRESOLINE) 10 MG tablet TAKE 1 TABLET BY MOUTH THREE TIMES A DAY (Patient taking differently: Take 10 mg by mouth in the morning and at bedtime. ) 90 tablet 1   No facility-administered medications prior to visit.    Allergies  Allergen Reactions  . Amlodipine Shortness Of Breath  . Darifenacin Hydrobromide     REACTION: causes her dizziness  . Sulfonamide Derivatives Other (See Comments)    Pt thinks it caused dizziness  . Tramadol Other (See Comments)    Insomnia, anorexia    Review of Systems  Constitutional: Positive for malaise/fatigue. Negative for fever.  HENT: Negative for congestion, ear pain and hearing loss.   Eyes: Negative for blurred vision and discharge.  Respiratory: Negative for cough and shortness of breath.   Cardiovascular: Negative for chest pain, palpitations and leg swelling.  Gastrointestinal: Negative for abdominal pain, blood in stool and nausea.  Genitourinary: Negative for dysuria and frequency.  Musculoskeletal: Negative for falls and myalgias.  Skin: Negative for rash.  Neurological: Negative for dizziness, focal weakness, loss of consciousness and headaches.  Endo/Heme/Allergies: Negative for environmental allergies.  Psychiatric/Behavioral: Negative for depression. The patient is not nervous/anxious.        Objective:    Physical Exam Constitutional:      General: She is not in acute distress.    Appearance: She is not diaphoretic.  HENT:     Head: Normocephalic and atraumatic.     Right Ear: External ear normal.      Left Ear: External ear normal.     Nose: Nose normal.     Mouth/Throat:     Pharynx: No oropharyngeal exudate.  Eyes:     General: No scleral icterus.       Right eye: No discharge.        Left eye: No discharge.     Conjunctiva/sclera: Conjunctivae normal.     Pupils: Pupils are equal, round, and reactive to light.  Neck:     Thyroid: No thyromegaly.  Cardiovascular:     Rate and Rhythm: Normal rate. Rhythm irregular.     Heart sounds: Normal heart sounds. No murmur heard.   Pulmonary:     Effort: Pulmonary effort is normal. No respiratory distress.     Breath sounds: Normal breath sounds. No wheezing or rales.  Abdominal:     General: Bowel sounds are normal. There is no distension.     Palpations: Abdomen is soft. There is no mass.     Tenderness: There is no abdominal tenderness.  Musculoskeletal:        General: No tenderness. Normal range of motion.     Cervical back: Normal range of motion and neck supple.  Lymphadenopathy:     Cervical: No cervical adenopathy.  Skin:    General: Skin is warm and dry.     Findings: No rash.  Neurological:     Mental Status: She is alert and oriented to person, place, and time.     Cranial Nerves: No cranial nerve deficit.     Coordination: Coordination normal.     Deep Tendon Reflexes: Reflexes are normal and symmetric. Reflexes normal.     BP 140/78 (BP Location: Left Arm, Cuff Size: Normal)   Pulse (!) 57   Temp 97.8 F (36.6 C) (Temporal)   Resp 12   Ht 4\' 11"  (1.499 m)   Wt 93 lb 6.4 oz (42.4 kg)  SpO2 97%   BMI 18.86 kg/m  Wt Readings from Last 3 Encounters:  06/11/19 93 lb 6.4 oz (42.4 kg)  02/09/19 92 lb 9.6 oz (42 kg)  10/29/18 92 lb 8 oz (42 kg)    Diabetic Foot Exam - Simple   No data filed     Lab Results  Component Value Date   WBC 9.8 06/12/2019   HGB 11.7 (L) 06/12/2019   HCT 34.3 (L) 06/12/2019   PLT 193.0 06/12/2019   GLUCOSE 90 06/12/2019   CHOL 165 06/12/2019   TRIG 98.0 06/12/2019   HDL  40.40 06/12/2019   LDLDIRECT 72.1 01/19/2009   LDLCALC 105 (H) 06/12/2019   ALT 11 06/12/2019   AST 16 06/12/2019   NA 137 06/12/2019   K 4.7 06/12/2019   CL 100 06/12/2019   CREATININE 0.99 06/12/2019   BUN 18 06/12/2019   CO2 31 06/12/2019   TSH 3.09 06/12/2019   INR 2.1 04/25/2018   HGBA1C 5.7 06/12/2019    Lab Results  Component Value Date   TSH 3.09 06/12/2019   Lab Results  Component Value Date   WBC 9.8 06/12/2019   HGB 11.7 (L) 06/12/2019   HCT 34.3 (L) 06/12/2019   MCV 91.8 06/12/2019   PLT 193.0 06/12/2019   Lab Results  Component Value Date   NA 137 06/12/2019   K 4.7 06/12/2019   CO2 31 06/12/2019   GLUCOSE 90 06/12/2019   BUN 18 06/12/2019   CREATININE 0.99 06/12/2019   BILITOT 0.6 06/12/2019   ALKPHOS 16 (L) 06/12/2019   AST 16 06/12/2019   ALT 11 06/12/2019   PROT 7.1 06/12/2019   ALBUMIN 3.9 06/12/2019   CALCIUM 9.2 06/12/2019   ANIONGAP 9 07/02/2017   GFR 52.30 (L) 06/12/2019   Lab Results  Component Value Date   CHOL 165 06/12/2019   Lab Results  Component Value Date   HDL 40.40 06/12/2019   Lab Results  Component Value Date   LDLCALC 105 (H) 06/12/2019   Lab Results  Component Value Date   TRIG 98.0 06/12/2019   Lab Results  Component Value Date   CHOLHDL 4 06/12/2019   Lab Results  Component Value Date   HGBA1C 5.7 06/12/2019       Assessment & Plan:   Problem List Items Addressed This Visit    Hypothyroidism    On Levothyroxine, continue to monitor      Vitamin D deficiency - Primary    Supplement and monitor      Relevant Orders   VITAMIN D 25 Hydroxy (Vit-D Deficiency, Fractures) (Completed)   Hyperlipidemia, mixed    Tolerating statin, encouraged heart healthy diet, avoid trans fats, minimize simple carbs and saturated fats. Increase exercise as tolerated      Relevant Medications   hydrALAZINE (APRESOLINE) 10 MG tablet   Other Relevant Orders   Lipid panel (Completed)   Anemia   Relevant Orders    CBC (Completed)   TSH (Completed)   Persistent atrial fibrillation (HCC)    Rate controlled and tolerating meds      Relevant Medications   hydrALAZINE (APRESOLINE) 10 MG tablet   GERD   Relevant Orders   Comprehensive metabolic panel (Completed)   TSH (Completed)   Renal insufficiency   Hyperglycemia    hgba1c acceptable, minimize simple carbs. Increase exercise as tolerated.      Relevant Orders   Hemoglobin A1c (Completed)   TSH (Completed)   Vitamin B12 deficiency    Supplement  and monitor      Preventative health care    Patient encouraged to maintain heart healthy diet, regular exercise, adequate sleep. Consider daily probiotics. Take medications as prescribed. Labs ordered and reviewed. Was given pneumovax.        Other Visit Diagnoses    Post-menopausal       Relevant Orders   DG Bone Density   Estrogen deficiency       Relevant Orders   DG Bone Density   Pneumococcal vaccine administered       Relevant Orders   Pneumococcal polysaccharide vaccine 23-valent greater than or equal to 2yo subcutaneous/IM (Completed)      I have discontinued Sharlize P. Chatwin's azelastine. I am also having her maintain her Vitamin D, Vitamin B-12, famotidine, ALPRAZolam, ramipril, Eliquis, levocetirizine, and hydrALAZINE.  No orders of the defined types were placed in this encounter.    Penni Homans, MD

## 2019-06-15 NOTE — Assessment & Plan Note (Signed)
Patient encouraged to maintain heart healthy diet, regular exercise, adequate sleep. Consider daily probiotics. Take medications as prescribed. Labs ordered and reviewed. Was given pneumovax.

## 2019-06-15 NOTE — Assessment & Plan Note (Signed)
Supplement and monitor 

## 2019-06-15 NOTE — Assessment & Plan Note (Signed)
Rate controlled and tolerating meds.  

## 2019-06-15 NOTE — Assessment & Plan Note (Signed)
hgba1c acceptable, minimize simple carbs. Increase exercise as tolerated.  

## 2019-06-15 NOTE — Assessment & Plan Note (Signed)
On Levothyroxine, continue to monitor 

## 2019-06-17 ENCOUNTER — Other Ambulatory Visit: Payer: Medicare Other

## 2019-06-17 ENCOUNTER — Encounter: Payer: Self-pay | Admitting: *Deleted

## 2019-07-09 DIAGNOSIS — C4442 Squamous cell carcinoma of skin of scalp and neck: Secondary | ICD-10-CM | POA: Diagnosis not present

## 2019-07-09 DIAGNOSIS — D485 Neoplasm of uncertain behavior of skin: Secondary | ICD-10-CM | POA: Diagnosis not present

## 2019-07-09 DIAGNOSIS — L821 Other seborrheic keratosis: Secondary | ICD-10-CM | POA: Diagnosis not present

## 2019-07-09 DIAGNOSIS — C44319 Basal cell carcinoma of skin of other parts of face: Secondary | ICD-10-CM | POA: Diagnosis not present

## 2019-07-27 ENCOUNTER — Ambulatory Visit: Payer: Medicare Other

## 2019-07-27 DIAGNOSIS — Z0000 Encounter for general adult medical examination without abnormal findings: Secondary | ICD-10-CM

## 2019-07-27 DIAGNOSIS — I10 Essential (primary) hypertension: Secondary | ICD-10-CM

## 2019-07-27 NOTE — Chronic Care Management (AMB) (Signed)
Chronic Care Management Pharmacy  Name: [name] [name]  MRN: [id number] DOB: October 26, 1924  Chief Complaint/ HPI  [name] [name],  86 y.o. , female presents for their Initial CCM visit with the clinical pharmacist via telephone due to COVID-19 Pandemic.  PCP : Mosie Lukes, MD  Their chronic conditions include: Afib, Hypertension, GERD, Allergic Rhinitis  Office Visits: 06/11/19: Visit w/ Dr. Charlett Blake -  Med changed to hydralazine BID vs TID to reflect how patient taking. Azelastine D/C.  Consult Visit: 02/09/19: Cardio visit w/ Dr. [name] - No med changes noted.  Medications: Outpatient Encounter Medications as of 07/27/2019  Medication Sig Note  . ALPRAZolam (XANAX) 0.25 MG tablet TAKE 0.5-1 TABLETS (0.125-0.25 MG TOTAL) BY MOUTH 2 (TWO) TIMES DAILY AS NEEDED FOR ANXIETY. 07/27/2019: As needed when she can't sleep, usually once weekly or less  . Cholecalciferol (VITAMIN D) 2000 UNITS CAPS Take 2,000 Units by mouth every other Wladyslaw Henrichs.   Marland Kitchen ELIQUIS 2.5 MG TABS tablet TAKE 1 TABLET BY MOUTH TWICE A Mirna Sutcliffe   . famotidine (PEPCID) 20 MG tablet Take 40 mg by mouth at bedtime.    . hydrALAZINE (APRESOLINE) 10 MG tablet Take 10 mg by mouth in the morning and at bedtime.   Marland Kitchen levocetirizine (XYZAL) 5 MG tablet TAKE 1/2 TABLET (2.5 MG TOTAL) BY MOUTH EVERY EVENING.   . metoprolol succinate (TOPROL-XL) 50 MG 24 hr tablet TAKE 1/2 TABLET BY MOUTH TWICE A Dawsyn Ramsaran WITH OR IMMEDIATELY FOLLOWING A MEAL.   . pantoprazole (PROTONIX) 40 MG tablet TAKE 1 TABLET BY MOUTH EVERY Tenita Cue   . ramipril (ALTACE) 10 MG capsule TAKE 1 CAPSULE (10 MG TOTAL) BY MOUTH 2 (TWO) TIMES DAILY.   Marland Kitchen Cyanocobalamin (VITAMIN B-12) 500 MCG SUBL Place 1 tablet (500 mcg total) under the tongue daily at 2 PM. 07/27/2019: Takes sometimes   No facility-administered encounter medications on file as of 07/27/2019.   SDOH Screenings   Alcohol Screen:   . Last Alcohol Screening Score (AUDIT):   Depression (PHQ2-9):   . PHQ-2 Score:   Financial  Resource Strain:   . Difficulty of Paying Living Expenses:   Food Insecurity:   . Worried About Charity fundraiser in the Last Year:   . [place] in the Last Year:   Housing:   . Last Housing Risk Score:   Physical Activity:   . Days of Exercise per Week:   . Minutes of Exercise per Session:   Social Connections:   . Frequency of Communication with Friends and Family:   . Frequency of Social Gatherings with Friends and Family:   . Attends Religious Services:   . Active Member of Clubs or Organizations:   . Attends Archivist Meetings:   Marland Kitchen Marital Status:   Stress:   . Feeling of Stress :   Tobacco Use: Low Risk   . Smoking Tobacco Use: Never Smoker  . Smokeless Tobacco Use: Never Used  Transportation Needs:   . Film/video editor (Medical):   Marland Kitchen Lack of Transportation (Non-Medical):      Current Diagnosis/Assessment:  Goals Addressed            This Visit's Progress   . Chronic Care Management Pharmacy Care Plan       CARE PLAN ENTRY (see longitudinal plan of care for additional care plan information)  Current Barriers:  . Chronic Disease Management support, education, and care coordination needs related to Afib, Hypertension, GERD, Allergic Rhinitis  Hypertension BP Readings from Last 3 Encounters:  06/11/19 140/78  02/09/19 124/82  12/02/18 (!) 168/68   . Pharmacist Clinical Goal(s): o Over the next 90 days, patient will work with PharmD and providers to maintain BP goal <140/90 . Current regimen:  . Ramipril 10mg  twice daily AM and HS . Hydralazine 10mg  twice daily  . Intervention: o Requested patient to check BP 2-3 times per week and record . Patient self care activities - Over the next 90 days, patient will: o Check BP 2-3 times per week, document, and provide at future appointments o Ensure daily salt intake < 2300 mg/Markelle Asaro  Health Maintenance  . Pharmacist Clinical Goal(s) o Over the next 90 days, patient will work with PharmD  and providers to complete health maintenance screenings/vaccinations . Interventions: o Recommended patient to complete Shingrix . Patient self care activities - Over the next 90 days, patient will: o Complete Shingrix vaccine series  Medication management . Pharmacist Clinical Goal(s): o Over the next 90 days, patient will work with PharmD and providers to maintain optimal medication adherence . Current pharmacy: [facility] . Interventions o Comprehensive medication review performed. o Continue current medication management strategy . Patient self care activities - Over the next 90 days, patient will: o Focus on medication adherence by filling and taking medications appropriately  o Take medications as prescribed o Report any questions or concerns to PharmD and/or provider(s)  Initial goal documentation      Social Hx:  Lives with 2 daughters.  Moved to a 3 bedroom.   AFIB   Patient is currently rate controlled.  Patient has failed these meds in past: None noted  Patient is currently controlled on the following medications:   Eliquis 2.5mg  twice AM and HS  Metoprolol succinate 50mg  1/2 tab twice daily  Plan -Continue current medications   Hypertension   BP goal is:  <140/90  Office blood pressures are  BP Readings from Last 3 Encounters:  06/11/19 140/78  02/09/19 124/82  12/02/18 (!) 168/68   Patient checks BP at home infrequently Patient home BP readings are ranging: "140 something over 60 something"  Patient has failed these meds in the past: amlodipine (SOB), hctz (hypokalemia?) Patient is currently controlled on the following medications:  . Ramipril 10mg  twice daily AM and HS . Hydralazine 10mg  twice daily   Denies headaches or chest pain  Plan -Requested patient to check BP 2-3 times per week and record -Continue current medications   GERD    Patient has failed these meds in past: esomeprazole (listed in D/C meds. No apparent reason for D/C) Patient  is currently controlled on the following medications:   Pantoprazole 40mg  daily  Famotidine 20mg  daily  Doesn't eat much solid food.   Plan -Continue current medications  Allergic Rhinitis    Patient has failed these meds in past: None noted  Patient is currently controlled on the following medications:   Levocetirizine 5mg  1/2 tab daily  No allergy symptoms  Plan -Continue current medications   Vaccines   Reviewed and discussed patient's vaccination history.    Immunization History  Administered Date(s) Administered  . Fluad Quad(high Dose 65+) 09/06/2018  . Influenza Whole 09/18/2007, 10/02/2010, 09/15/2012  . Influenza, High Dose Seasonal PF 10/05/2015, 10/01/2016, 10/18/2017  . Influenza,inj,Quad PF,6+ Mos 08/24/2013, 08/31/2014  . PFIZER SARS-COV-2 Vaccination 02/11/2019, 03/04/2019  . Pneumococcal Conjugate-13 08/31/2014  . Pneumococcal Polysaccharide-23 02/20/2006, 06/11/2019  . Tdap 10/30/2010    Plan  Recommended patient receive Shingrix vaccine in  pharmacy.   Miscellaneous Meds Vitamin B-12 530mcg daily Vitamin D 2000 units daily Alprazolam 0.25mg  as needed

## 2019-08-01 NOTE — Patient Instructions (Signed)
Visit Information  Goals Addressed            This Visit's Progress   . Chronic Care Management Pharmacy Care Plan       CARE PLAN ENTRY (see longitudinal plan of care for additional care plan information)  Current Barriers:  . Chronic Disease Management support, education, and care coordination needs related to Afib, Hypertension, GERD, Allergic Rhinitis   Hypertension BP Readings from Last 3 Encounters:  06/11/19 140/78  02/09/19 124/82  12/02/18 (!) 168/68   . Pharmacist Clinical Goal(s): o Over the next 90 days, patient will work with PharmD and providers to maintain BP goal <140/90 . Current regimen:  . Ramipril 10mg  twice daily AM and HS . Hydralazine 10mg  twice daily  . Intervention: o Requested patient to check BP 2-3 times per week and record . Patient self care activities - Over the next 90 days, patient will: o Check BP 2-3 times per week, document, and provide at future appointments o Ensure daily salt intake < 2300 mg/[name] [name]  Health Maintenance  . Pharmacist Clinical Goal(s) o Over the next 90 days, patient will work with PharmD and providers to complete health maintenance screenings/vaccinations . Interventions: o Recommended patient to complete Shingrix . Patient self care activities - Over the next 90 days, patient will: o Complete Shingrix vaccine series  Medication management . Pharmacist Clinical Goal(s): o Over the next 90 days, patient will work with PharmD and providers to maintain optimal medication adherence . Current pharmacy: [facility] . Interventions o Comprehensive medication review performed. o Continue current medication management strategy . Patient self care activities - Over the next 90 days, patient will: o Focus on medication adherence by filling and taking medications appropriately  o Take medications as prescribed o Report any questions or concerns to PharmD and/or provider(s)  Initial goal documentation        [name] [name] was given  information about Chronic Care Management services today including:  1. CCM service includes personalized support from designated clinical staff supervised by her physician, including individualized plan of care and coordination with other care providers 2. 24/7 contact phone numbers for assistance for urgent and routine care needs. 3. Standard insurance, coinsurance, copays and deductibles apply for chronic care management only during months in which we provide at least 20 minutes of these services. Most insurances cover these services at 100%, however patients may be responsible for any copay, coinsurance and/or deductible if applicable. This service may help you avoid the need for more expensive face-to-face services. 4. Only one practitioner may furnish and bill the service in a calendar month. 5. The patient may stop CCM services at any time (effective at the end of the month) by phone call to the office staff.  Patient agreed to services and verbal consent obtained.   The patient verbalized understanding of instructions provided today and agreed to receive a mailed copy of patient instruction and/or educational materials. The pharmacy team will reach out to the patient again over the next 90 days.   De Blanch, PharmD Clinical Pharmacist [facility] at [facility] [phone]   How to Take Your Blood Pressure You can take your blood pressure at home with a machine. You may need to check your blood pressure at home:  To check if you have high blood pressure (hypertension).  To check your blood pressure over time.  To make sure your blood pressure medicine is working. Supplies needed: You will need a blood pressure machine, or  monitor. You can buy one at a drugstore or online. When choosing one:  Choose one with an arm cuff.  Choose one that wraps around your upper arm. Only one finger should fit between your arm and the cuff.  Do not choose one that measures  your blood pressure from your wrist or finger. Your doctor can suggest a monitor. How to prepare Avoid these things for 30 minutes before checking your blood pressure:  Drinking caffeine.  Drinking alcohol.  Eating.  Smoking.  Exercising. Five minutes before checking your blood pressure:  Pee.  Sit in a dining chair. Avoid sitting in a soft couch or armchair.  Be quiet. Do not talk. How to take your blood pressure Follow the instructions that came with your machine. If you have a digital blood pressure monitor, these may be the instructions: 1. Sit up straight. 2. Place your feet on the floor. Do not cross your ankles or legs. 3. Rest your left arm at the level of your heart. You may rest it on a table, desk, or chair. 4. Pull up your shirt sleeve. 5. Wrap the blood pressure cuff around the upper part of your left arm. The cuff should be 1 inch (2.5 cm) above your elbow. It is best to wrap the cuff around bare skin. 6. Fit the cuff snugly around your arm. You should be able to place only one finger between the cuff and your arm. 7. Put the cord inside the groove of your elbow. 8. Press the power button. 9. Sit quietly while the cuff fills with air and loses air. 10. Write down the numbers on the screen. 11. Wait 2-3 minutes and then repeat steps 1-10. What do the numbers mean? Two numbers make up your blood pressure. The first number is called systolic pressure. The second is called diastolic pressure. An example of a blood pressure reading is "120 over 80" (or 120/80). If you are an adult and do not have a medical condition, use this guide to find out if your blood pressure is normal: Normal  First number: below 120.  Second number: below 80. Elevated  First number: 120-129.  Second number: below 80. Hypertension stage 1  First number: 130-139.  Second number: 80-89. Hypertension stage 2  First number: 140 or above.  Second number: 33 or above. Your blood  pressure is above normal even if only the top or bottom number is above normal. Follow these instructions at home:  Check your blood pressure as often as your doctor tells you to.  Take your monitor to your next doctor's appointment. Your doctor will: ? Make sure you are using it correctly. ? Make sure it is working right.  Make sure you understand what your blood pressure numbers should be.  Tell your doctor if your medicines are causing side effects. Contact a doctor if:  Your blood pressure keeps being high. Get help right away if:  Your first blood pressure number is higher than 180.  Your second blood pressure number is higher than 120. This information is not intended to replace advice given to you by your health care provider. Make sure you discuss any questions you have with your health care provider. Document Revised: 11/30/2016 Document Reviewed: 05/27/2015 Elsevier Patient Education  2020 [employer].

## 2019-08-06 ENCOUNTER — Other Ambulatory Visit: Payer: Self-pay | Admitting: Family Medicine

## 2019-08-06 DIAGNOSIS — I10 Essential (primary) hypertension: Secondary | ICD-10-CM

## 2019-08-20 DIAGNOSIS — C44319 Basal cell carcinoma of skin of other parts of face: Secondary | ICD-10-CM | POA: Diagnosis not present

## 2019-08-20 DIAGNOSIS — C4442 Squamous cell carcinoma of skin of scalp and neck: Secondary | ICD-10-CM | POA: Diagnosis not present

## 2019-08-26 ENCOUNTER — Other Ambulatory Visit: Payer: Self-pay | Admitting: Family Medicine

## 2019-08-26 DIAGNOSIS — G4700 Insomnia, unspecified: Secondary | ICD-10-CM

## 2019-08-26 NOTE — Telephone Encounter (Signed)
Requesting: xanax Contract:n/a UDS:n/a Last Visit:06/11/19 Next Visit10/28/21 Last Refill:01/08/19   Please Advise

## 2019-09-10 ENCOUNTER — Telehealth: Payer: Self-pay | Admitting: Pharmacist

## 2019-09-10 NOTE — Progress Notes (Signed)
Chronic Care Management Pharmacy Assistant   Name: [name] [name]  MRN: [id number] DOB: [birth year]  Reason for Encounter: Disease State  Patient Questions:  1.  Have you seen any other providers since your last visit? No  2.  Any changes in your medicines or health? No    PCP : Mosie Lukes, MD   Their chronic conditions include: Afib, Hypertension, GERD, Allergic Rhinitis  There have been no OV, Consults, or Hospitalizations since their last CCM visit.  Allergies:   Allergies  Allergen Reactions  . Amlodipine Shortness Of Breath  . Darifenacin Hydrobromide     REACTION: causes her dizziness  . Sulfonamide Derivatives Other (See Comments)    Pt thinks it caused dizziness  . Tramadol Other (See Comments)    Insomnia, anorexia    Medications: Outpatient Encounter Medications as of 09/10/2019  Medication Sig Note  . ALPRAZolam (XANAX) 0.25 MG tablet TAKE 0.5-1 TABLETS (0.125-0.25 MG TOTAL) BY MOUTH 2 (TWO) TIMES DAILY AS NEEDED FOR ANXIETY.   . Cholecalciferol (VITAMIN D) 2000 UNITS CAPS Take 2,000 Units by mouth every other day.   . Cyanocobalamin (VITAMIN B-12) 500 MCG SUBL Place 1 tablet (500 mcg total) under the tongue daily at 2 PM. 07/27/2019: Takes sometimes  . ELIQUIS 2.5 MG TABS tablet TAKE 1 TABLET BY MOUTH TWICE A DAY   . famotidine (PEPCID) 20 MG tablet Take 40 mg by mouth at bedtime.    . hydrALAZINE (APRESOLINE) 10 MG tablet TAKE 1 TABLET BY MOUTH THREE TIMES A DAY   . levocetirizine (XYZAL) 5 MG tablet TAKE 1/2 TABLET (2.5 MG TOTAL) BY MOUTH EVERY EVENING.   . metoprolol succinate (TOPROL-XL) 50 MG 24 hr tablet TAKE 1/2 TABLET BY MOUTH TWICE A DAY WITH OR IMMEDIATELY FOLLOWING A MEAL.   . pantoprazole (PROTONIX) 40 MG tablet TAKE 1 TABLET BY MOUTH EVERY DAY   . ramipril (ALTACE) 10 MG capsule TAKE 1 CAPSULE (10 MG TOTAL) BY MOUTH 2 (TWO) TIMES DAILY.    No facility-administered encounter medications on file as of 09/10/2019.    Current Diagnosis: Patient  Active Problem List   Diagnosis Date Noted  . Esophageal dysphagia 10/29/2018  . Cough 09/02/2017  . Hyponatremia 06/29/2017  . Bradycardia 06/29/2017  . Chest pain 12/10/2016  . Pedal edema 10/01/2016  . Preventative health care 03/18/2016  . Vitamin B12 deficiency 03/16/2016  . Renal lithiasis 06/26/2015  . Loss of weight 06/05/2015  . Diverticulitis of colon 02/06/2015  . Right-sided thoracic back pain 11/15/2014  . Nipple discharge 06/21/2014  . Rib pain on right side 04/29/2014  . Medicare annual wellness visit, subsequent 02/05/2014  . Abdominal pain, chronic, right lower quadrant 02/03/2014  . Rectal bleeding 12/03/2013  . Prolapse of female pelvic organs 12/03/2013  . Leukocytes in urine 11/25/2013  . Abnormal chest x-ray 10/23/2013  . Pulmonary hypertension ([place]) 10/09/2013  . Tricuspid regurgitation 10/09/2013  . Chronic anticoagulation 10/09/2013  . Hyperglycemia 08/24/2013  . Encounter for therapeutic drug monitoring 03/13/2013  . Renal insufficiency 06/28/2012  . Hyperkalemia 06/28/2012  . Headache 06/22/2012  . Sinus bradycardia 09/20/2011  . Urinary frequency 02/13/2011  . Arthritis 11/13/2010  . Long term (current) use of anticoagulants 03/31/2010  . DIZZINESS 08/03/2009  . Achalasia 05/18/2009  . DYSPHAGIA UNSPECIFIED 04/11/2009  . Anemia 01/19/2009  . ESOPHAGEAL STRICTURE 01/19/2009  . Anxiety state 09/15/2008  . MITRAL REGURGITATION, MILD 08/20/2008  . Persistent atrial fibrillation ([place]) 08/20/2008  . MITRAL VALVE PROLAPSE, HX OF 08/20/2008  .  Vitamin D deficiency 01/15/2008  . IBS 02/03/2007  . Hypothyroidism 01/29/2007  . Hyperlipidemia, mixed 01/29/2007  . Essential hypertension 01/29/2007  . GERD 01/29/2007  . Disorder of bone and cartilage 01/29/2007  . COLONIC POLYPS, HX OF 01/29/2007    Goals Addressed   None    Reviewed chart prior to disease state call. Spoke with patient regarding BP  Recent Office Vitals: BP Readings from Last  3 Encounters:  06/11/19 140/78  02/09/19 124/82  12/02/18 (!) 168/68   Pulse Readings from Last 3 Encounters:  06/11/19 (!) 57  02/09/19 (!) 103  12/02/18 (!) 175    Wt Readings from Last 3 Encounters:  06/11/19 93 lb 6.4 oz (42.4 kg)  02/09/19 92 lb 9.6 oz (42 kg)  10/29/18 92 lb 8 oz (42 kg)     Kidney Function Lab Results  Component Value Date/Time   CREATININE 0.99 06/12/2019 10:33 AM   CREATININE 0.93 08/15/2018 11:48 AM   CREATININE 0.99 02/05/2014 03:01 PM   CREATININE 1.15 (H) 08/24/2013 10:49 AM   GFR 52.30 (L) 06/12/2019 10:33 AM   GFRNONAA 54 (L) 08/15/2018 11:48 AM   GFRAA 62 08/15/2018 11:48 AM    BMP Latest Ref Rng & Units 06/12/2019 08/15/2018 02/21/2018  Glucose 70 - 99 mg/dL 90 91 90  BUN 6 - 23 mg/dL 18 11 16   Creatinine 0.40 - 1.20 mg/dL 0.99 0.93 0.96  BUN/Creat Ratio 12 - 28 - 12 -  Sodium 135 - 145 mEq/L 137 141 141  Potassium 3.5 - 5.1 mEq/L 4.7 4.3 4.2  Chloride 96 - 112 mEq/L 100 101 102  CO2 19 - 32 mEq/L 31 26 32  Calcium 8.4 - 10.5 mg/dL 9.2 9.5 9.3     Chronic Care Management   Outreach Note  09/17/2019 Name: [name] [name] MRN: [id number] DOB: 07-20-1924  Referred by: Mosie Lukes, MD Reason for referral : Chronic Care Management   Third unsuccessful telephone outreach was attempted today. The patient was referred to the pharmacist for assistance with care management and care coordination.    Follow-Up:  Pharmacist Review   Fanny Skates, [place] [address] Pharmacist Assistant [phone]

## 2019-09-17 ENCOUNTER — Encounter: Payer: Self-pay | Admitting: Family Medicine

## 2019-09-17 DIAGNOSIS — C44319 Basal cell carcinoma of skin of other parts of face: Secondary | ICD-10-CM | POA: Diagnosis not present

## 2019-09-30 ENCOUNTER — Telehealth: Payer: Self-pay | Admitting: Pharmacist

## 2019-09-30 NOTE — Progress Notes (Addendum)
Verified Adherence Gap Information. Per insurance data, the patient is 100% compliant with her Ace/Arb medication, Ramipril 10 mg cap. Last fill date 08-06-19 for 90 day supply as of 09-15-19.  Fanny Skates, [place] Pharmacist Assistant [phone]  Reviewed by: De Blanch, PharmD Clinical Pharmacist [facility] [phone]

## 2019-10-01 DIAGNOSIS — D485 Neoplasm of uncertain behavior of skin: Secondary | ICD-10-CM | POA: Diagnosis not present

## 2019-10-04 ENCOUNTER — Other Ambulatory Visit: Payer: Self-pay | Admitting: Family Medicine

## 2019-10-04 DIAGNOSIS — I10 Essential (primary) hypertension: Secondary | ICD-10-CM

## 2019-10-22 ENCOUNTER — Telehealth: Payer: Self-pay | Admitting: Pharmacist

## 2019-10-22 NOTE — Progress Notes (Addendum)
**Note [name]-Identified via Obfuscation** Chronic Care Management Pharmacy Assistant   Name: [name] [name]  MRN: [id number] DOB: 1924-07-02  Reason for Encounter: Medication Review/ General Adherence  Patient Questions:  1.  Have you seen any other providers since your last visit?   2.  Any changes in your medicines or health?   PCP : [name] Lukes, MD  Allergies:   Allergies  Allergen Reactions  . Amlodipine Shortness Of Breath  . Darifenacin Hydrobromide     REACTION: causes her dizziness  . Sulfonamide Derivatives Other (See Comments)    Pt thinks it caused dizziness  . Tramadol Other (See Comments)    Insomnia, anorexia    Medications: Outpatient Encounter Medications as of 10/22/2019  Medication Sig Note  . ALPRAZolam (XANAX) 0.25 MG tablet TAKE 0.5-1 TABLETS (0.125-0.25 MG TOTAL) BY MOUTH 2 (TWO) TIMES DAILY AS NEEDED FOR ANXIETY.   . Cholecalciferol (VITAMIN D) 2000 UNITS CAPS Take 2,000 Units by mouth every other day.   . Cyanocobalamin (VITAMIN B-12) 500 MCG SUBL Place 1 tablet (500 mcg total) under the tongue daily at 2 PM. 07/27/2019: Takes sometimes  . ELIQUIS 2.5 MG TABS tablet TAKE 1 TABLET BY MOUTH TWICE A DAY   . famotidine (PEPCID) 20 MG tablet Take 40 mg by mouth at bedtime.    . hydrALAZINE (APRESOLINE) 10 MG tablet TAKE 1 TABLET BY MOUTH THREE TIMES A DAY   . levocetirizine (XYZAL) 5 MG tablet TAKE 1/2 TABLET (2.5 MG TOTAL) BY MOUTH EVERY EVENING.   . metoprolol succinate (TOPROL-XL) 50 MG 24 hr tablet TAKE 1/2 TABLET BY MOUTH TWICE A DAY WITH OR IMMEDIATELY FOLLOWING A MEAL.   . pantoprazole (PROTONIX) 40 MG tablet TAKE 1 TABLET BY MOUTH EVERY DAY   . ramipril (ALTACE) 10 MG capsule TAKE 1 CAPSULE (10 MG TOTAL) BY MOUTH 2 (TWO) TIMES DAILY.    No facility-administered encounter medications on file as of 10/22/2019.    Current Diagnosis: Patient Active Problem List   Diagnosis Date Noted  . Esophageal dysphagia 10/29/2018  . Cough 09/02/2017  . Hyponatremia 06/29/2017  . Bradycardia  06/29/2017  . Chest pain 12/10/2016  . Pedal edema 10/01/2016  . Preventative health care 03/18/2016  . Vitamin B12 deficiency 03/16/2016  . Renal lithiasis 06/26/2015  . Loss of weight 06/05/2015  . Diverticulitis of colon 02/06/2015  . Right-sided thoracic back pain 11/15/2014  . Nipple discharge 06/21/2014  . Rib pain on right side 04/29/2014  . Medicare annual wellness visit, subsequent 02/05/2014  . Abdominal pain, chronic, right lower quadrant 02/03/2014  . Rectal bleeding 12/03/2013  . Prolapse of female pelvic organs 12/03/2013  . Leukocytes in urine 11/25/2013  . Abnormal chest x-ray 10/23/2013  . Pulmonary hypertension ([place]) 10/09/2013  . Tricuspid regurgitation 10/09/2013  . Chronic anticoagulation 10/09/2013  . Hyperglycemia 08/24/2013  . Encounter for therapeutic drug monitoring 03/13/2013  . Renal insufficiency 06/28/2012  . Hyperkalemia 06/28/2012  . Headache 06/22/2012  . Sinus bradycardia 09/20/2011  . Urinary frequency 02/13/2011  . Arthritis 11/13/2010  . Long term (current) use of anticoagulants 03/31/2010  . DIZZINESS 08/03/2009  . Achalasia 05/18/2009  . DYSPHAGIA UNSPECIFIED 04/11/2009  . Anemia 01/19/2009  . ESOPHAGEAL STRICTURE 01/19/2009  . Anxiety state 09/15/2008  . MITRAL REGURGITATION, MILD 08/20/2008  . Persistent atrial fibrillation ([place]) 08/20/2008  . MITRAL VALVE PROLAPSE, HX OF 08/20/2008  . Vitamin D deficiency 01/15/2008  . IBS 02/03/2007  . Hypothyroidism 01/29/2007  . Hyperlipidemia, mixed 01/29/2007  . Essential hypertension 01/29/2007  .  GERD 01/29/2007  . Disorder of bone and cartilage 01/29/2007  . COLONIC POLYPS, HX OF 01/29/2007    Goals Addressed   None    Called patient and spoke with her daughter [name] [name] and discussed medication adherence with patient, no issues at this time with current medication. Daughter states patient received flu shot on PCP appointment on 10-29-19.  Patient denies ED visit since last CPP  follow up. Patient denies any side effect with her medication. Patient denies any problems with her current pharmacy  Follow-Up:  Pharmacist Review   [name] [name], [facility] [place] [phone]  Scheduled 6 month clinical pharmacist followup for 01/27/2020. Reviewed by: [name] [name], PharmD Clinical Pharmacist [facility] [phone]

## 2019-10-24 ENCOUNTER — Other Ambulatory Visit: Payer: Self-pay | Admitting: Cardiology

## 2019-10-26 NOTE — Telephone Encounter (Signed)
Prescription refill request for Eliquis received. Indication:a flutter Last office visit: 02/09/19 Scr: 0.99 on 06/12/19 Age: 86 Weight:42.4 kg

## 2019-10-29 ENCOUNTER — Other Ambulatory Visit: Payer: Self-pay

## 2019-10-29 ENCOUNTER — Encounter: Payer: Self-pay | Admitting: Family Medicine

## 2019-10-29 ENCOUNTER — Ambulatory Visit (INDEPENDENT_AMBULATORY_CARE_PROVIDER_SITE_OTHER): Payer: Medicare Other | Admitting: Family Medicine

## 2019-10-29 VITALS — BP 138/78 | HR 66 | Temp 97.6°F | Resp 16 | Wt 92.9 lb

## 2019-10-29 DIAGNOSIS — R059 Cough, unspecified: Secondary | ICD-10-CM

## 2019-10-29 DIAGNOSIS — E538 Deficiency of other specified B group vitamins: Secondary | ICD-10-CM | POA: Diagnosis not present

## 2019-10-29 DIAGNOSIS — I10 Essential (primary) hypertension: Secondary | ICD-10-CM | POA: Diagnosis not present

## 2019-10-29 DIAGNOSIS — E559 Vitamin D deficiency, unspecified: Secondary | ICD-10-CM

## 2019-10-29 DIAGNOSIS — N289 Disorder of kidney and ureter, unspecified: Secondary | ICD-10-CM | POA: Diagnosis not present

## 2019-10-29 DIAGNOSIS — R739 Hyperglycemia, unspecified: Secondary | ICD-10-CM

## 2019-10-29 DIAGNOSIS — E039 Hypothyroidism, unspecified: Secondary | ICD-10-CM | POA: Diagnosis not present

## 2019-10-29 DIAGNOSIS — E782 Mixed hyperlipidemia: Secondary | ICD-10-CM

## 2019-10-29 DIAGNOSIS — Z23 Encounter for immunization: Secondary | ICD-10-CM

## 2019-10-29 DIAGNOSIS — K219 Gastro-esophageal reflux disease without esophagitis: Secondary | ICD-10-CM

## 2019-10-29 DIAGNOSIS — I4819 Other persistent atrial fibrillation: Secondary | ICD-10-CM

## 2019-10-29 NOTE — Assessment & Plan Note (Signed)
Is better now but had been flared with some yellow sputum produced she will let us know if she worsens again

## 2019-10-29 NOTE — Patient Instructions (Addendum)
Pf[employer] Covid shot in 2 weeks at [facility] Heartburn Heartburn is a type of pain or discomfort that can happen in the throat or chest. It is often described as a burning pain. It may also cause a bad, acid-like taste in the mouth. Heartburn may feel worse when you lie down or bend over, and it is often worse at night. Heartburn may be caused by stomach contents that move back up into the esophagus (reflux). Follow these instructions at home: Eating and drinking   Avoid certain foods and drinks as told by your health care provider. This may include: ? Coffee and tea (with or without caffeine). ? Drinks that contain alcohol. ? Energy drinks and sports drinks. ? Carbonated drinks or sodas. ? Chocolate and cocoa. ? Peppermint and mint flavorings. ? Garlic and onions. ? Horseradish. ? Spicy and acidic foods, including peppers, chili powder, curry powder, vinegar, hot sauces, and barbecue sauce. ? Citrus fruit juices and citrus fruits, such as oranges, lemons, and limes. ? Tomato-based foods, such as red sauce, chili, salsa, and pizza with red sauce. ? Fried and fatty foods, such as donuts, french fries, potato chips, and high-fat dressings. ? High-fat meats, such as hot dogs and fatty cuts of red and white meats, such as rib eye steak, sausage, ham, and bacon. ? High-fat dairy items, such as whole milk, butter, and cream cheese.  Eat small, frequent meals instead of large meals.  Avoid drinking large amounts of liquid with your meals.  Avoid eating meals during the 2-3 hours before bedtime.  Avoid lying down right after you eat.  Do not exercise right after you eat. Lifestyle      If you are overweight, reduce your weight to an amount that is healthy for you. Ask your health care provider for guidance about a safe weight loss goal.  Do not use any products that contain nicotine or tobacco, such as cigarettes, e-cigarettes, and chewing tobacco. These can make your  symptoms worse. If you need help quitting, ask your health care provider.  Wear loose-fitting clothing. Do not wear anything tight around your waist that causes pressure on your abdomen.  Raise (elevate) the head of your bed about 6 inches (15 cm) when you sleep.  Try to reduce your stress, such as with yoga or meditation. If you need help reducing stress, ask your health care provider. General instructions  Pay attention to any changes in your symptoms.  Take over-the-counter and prescription medicines only as told by your health care provider. ? Do not take aspirin, ibuprofen, or other NSAIDs unless your health care provider told you to do so. ? Stop medicines only as told by your health care provider. If you stop taking some medicines too quickly, your symptoms may get worse.  Keep all follow-up visits as told by your health care provider. This is important. Contact a health care provider if:  You have new symptoms.  You have unexplained weight loss.  You have difficulty swallowing, or it hurts to swallow.  You have wheezing or a persistent cough.  Your symptoms do not improve with treatment.  You have frequent heartburn for more than 2 weeks. Get help right away if:  You have pain in your arms, neck, jaw, teeth, or back.  You feel sweaty, dizzy, or light-headed.  You have chest pain or shortness of breath.  You vomit and your vomit looks like blood or coffee grounds.  Your stool is bloody or black. These symptoms  may represent a serious problem that is an emergency. Do not wait to see if the symptoms will go away. Get medical help right away. Call your local emergency services (911 in the [place] Do not drive yourself to the hospital. Summary  Heartburn is a type of pain or discomfort that can happen in the throat or chest. It is often described as a burning pain. It may also cause a bad, acid-like taste in the mouth.  Avoid certain foods and drinks as told by your  health care provider.  Take over-the-counter and prescription medicines only as told by your health care provider. Do not take aspirin, ibuprofen, or other NSAIDs unless your health care provider told you to do so.  Contact a health care provider if your symptoms do not improve or they get worse. This information is not intended to replace advice given to you by your health care provider. Make sure you discuss any questions you have with your health care provider. Document Revised: 05/20/2017 Document Reviewed: 05/20/2017 Elsevier Patient Education  [employer] [place].

## 2019-10-29 NOTE — Assessment & Plan Note (Signed)
Avoid offending foods, start probiotics. Do not eat large meals in late evening and consider raising head of bed.  

## 2019-10-29 NOTE — Assessment & Plan Note (Signed)
Supplement and monitor 

## 2019-10-29 NOTE — Assessment & Plan Note (Signed)
Hydrate and monitor 

## 2019-10-29 NOTE — Assessment & Plan Note (Signed)
Rate controlled and tolerating Eliquis  

## 2019-10-29 NOTE — Progress Notes (Signed)
Subjective:    Patient ID: [name] [name], female    DOB: Aug 16, 1924, 86 y.o.   MRN: [id number]  Chief Complaint  Patient presents with  . Follow-up  . Hypertension    HPI Patient is in today for follow upon chronic medical concerns. No recent febrile illness or hospitalizations. She reports she is eating well and manages her ADLs at home well. No acute concerns. Denies CP/palp/SOB/HA/congestion/fevers/GI or GU c/o. Taking meds as prescribed  Past Medical History:  Diagnosis Date  . Anemia   . Anxiety   . Arthritis    hips, knees  . Current use of long term anticoagulation   . Diverticulitis of colon 02/06/2015  . Diverticulosis   . Gait difficulty   . GERD (gastroesophageal reflux disease)   . Headache(784.0) 06/22/2012  . Hyperlipidemia   . Hypertension   . IBS (irritable bowel syndrome)   . Incontinence   . Loss of weight 06/05/2015  . Medicare annual wellness visit, subsequent 02/05/2014  . Melena   . Mild mitral regurgitation   . Mild tricuspid regurgitation   . Mitral and aortic regurgitation   . Mitral valve prolapse    hx of - not seen on recent echoes  . Osteopenia   . Paroxysmal atrial flutter ([place])   . Pedal edema 10/01/2016  . Persistent atrial fibrillation ([place])   . Personal history of colonic polyps   . Renal lithiasis 06/26/2015  . Sinus bradycardia   . Thyroid disease    hypo  . Unspecified adverse effect of other drug, medicinal and biological substance(995.29)   . Vitamin B12 deficiency 03/16/2016  . Vitamin D deficiency     Past Surgical History:  Procedure Laterality Date  . BOTOX INJECTION N/A 09/03/2012   Procedure: BOTOX INJECTION;  Surgeon: Inda Castle, MD;  Location: [facility];  Service: Endoscopy;  Laterality: N/A;  . BREAST BIOPSY Left   . BREAST LUMPECTOMY Right 08/18/2014   Procedure: RIGHT BREAST LUMPECTOMY;  Surgeon: Coralie Keens, MD;  Location: [place];  Service: General;  Laterality: Right;  . CARDIAC  ELECTROPHYSIOLOGY MAPPING AND ABLATION    . CATARACT EXTRACTION, BILATERAL    . ESOPHAGOGASTRODUODENOSCOPY N/A 09/03/2012   Procedure: ESOPHAGOGASTRODUODENOSCOPY (EGD);  Surgeon: Inda Castle, MD;  Location: [facility];  Service: Endoscopy;  Laterality: N/A;  . ESOPHAGOGASTRODUODENOSCOPY (EGD) WITH PROPOFOL N/A 12/02/2018   Procedure: ESOPHAGOGASTRODUODENOSCOPY (EGD) WITH PROPOFOL;  Surgeon: [name], MD;  Location: [facility];  Service: Gastroenterology;  Laterality: N/A;  . ESOPHAGOSCOPY W/ BOTOX INJECTION    . I & D EXTREMITY Right 11/06/2012   Procedure: IRRIGATION AND DEBRIDEMENT EXTREMITY Right Ring Finger;  Surgeon: Tennis Must, MD;  Location: [place];  Service: Orthopedics;  Laterality: Right;  . PARTIAL HYSTERECTOMY     ovaries left in place  . skin cancer removal      Family History  Problem Relation Age of Onset  . Diabetes Mother   . CVA Mother   . Stroke Mother   . COPD Father   . Rheum arthritis Father   . Allergies Daughter   . COPD Daughter   . Stroke Daughter   . COPD Daughter        previous smoker  . Stroke Maternal Grandfather   . Heart disease Maternal Aunt   . Heart disease Maternal Uncle   . Colon cancer Neg Hx   . Colon polyps Neg Hx   . Esophageal cancer Neg Hx   .  Gallbladder disease Neg Hx   . Kidney disease Neg Hx   . Heart attack Neg Hx   . Hypertension Neg Hx     Social History   Socioeconomic History  . Marital status: Widowed    Spouse name: Not on file  . Number of children: 5  . Years of education: Not on file  . Highest education level: Not on file  Occupational History  . Occupation: retired    Fish farm manager: RETIRED  Tobacco Use  . Smoking status: Never Smoker  . Smokeless tobacco: Never Used  Vaping Use  . Vaping Use: Never used  Substance and Sexual Activity  . Alcohol use: No  . Drug use: No  . Sexual activity: Not on file    Comment: lives with Daughter, grandson and his family. no dietary restricitons.  Other  Topics Concern  . Not on file  Social History Narrative  . Not on file   Social Determinants of Health   Financial Resource Strain:   . Difficulty of Paying Living Expenses: Not on file  Food Insecurity:   . Worried About Charity fundraiser in the Last Year: Not on file  . Ran Out of Food in the Last Year: Not on file  Transportation Needs:   . Lack of Transportation (Medical): Not on file  . Lack of Transportation (Non-Medical): Not on file  Physical Activity:   . Days of Exercise per Week: Not on file  . Minutes of Exercise per Session: Not on file  Stress:   . Feeling of Stress : Not on file  Social Connections:   . Frequency of Communication with Friends and Family: Not on file  . Frequency of Social Gatherings with Friends and Family: Not on file  . Attends Religious Services: Not on file  . Active Member of Clubs or Organizations: Not on file  . Attends Archivist Meetings: Not on file  . Marital Status: Not on file  Intimate Partner Violence:   . Fear of Current or Ex-Partner: Not on file  . Emotionally Abused: Not on file  . Physically Abused: Not on file  . Sexually Abused: Not on file    Outpatient Medications Prior to Visit  Medication Sig Dispense Refill  . ALPRAZolam (XANAX) 0.25 MG tablet TAKE 0.5-1 TABLETS (0.125-0.25 MG TOTAL) BY MOUTH 2 (TWO) TIMES DAILY AS NEEDED FOR ANXIETY. 30 tablet 1  . Cholecalciferol (VITAMIN D) 2000 UNITS CAPS Take 2,000 Units by mouth every other day.    . Cyanocobalamin (VITAMIN B-12) 500 MCG SUBL Place 1 tablet (500 mcg total) under the tongue daily at 2 PM. 150 tablet   . ELIQUIS 2.5 MG TABS tablet TAKE 1 TABLET BY MOUTH TWICE A DAY 60 tablet 5  . famotidine (PEPCID) 20 MG tablet Take 40 mg by mouth at bedtime.     . hydrALAZINE (APRESOLINE) 10 MG tablet TAKE 1 TABLET BY MOUTH THREE TIMES A DAY 90 tablet 1  . levocetirizine (XYZAL) 5 MG tablet TAKE 1/2 TABLET (2.5 MG TOTAL) BY MOUTH EVERY EVENING. 45 tablet 2  .  metoprolol succinate (TOPROL-XL) 50 MG 24 hr tablet TAKE 1/2 TABLET BY MOUTH TWICE A DAY WITH OR IMMEDIATELY FOLLOWING A MEAL. 90 tablet 1  . pantoprazole (PROTONIX) 40 MG tablet TAKE 1 TABLET BY MOUTH EVERY DAY 90 tablet 1  . ramipril (ALTACE) 10 MG capsule TAKE 1 CAPSULE (10 MG TOTAL) BY MOUTH 2 (TWO) TIMES DAILY. 180 capsule 1   No facility-administered medications  prior to visit.    Allergies  Allergen Reactions  . Amlodipine Shortness Of Breath  . Darifenacin Hydrobromide     REACTION: causes her dizziness  . Sulfonamide Derivatives Other (See Comments)    Pt thinks it caused dizziness  . Tramadol Other (See Comments)    Insomnia, anorexia    Review of Systems  Constitutional: Negative for fever and malaise/fatigue.  HENT: Negative for congestion.   Eyes: Negative for blurred vision.  Respiratory: Positive for cough. Negative for shortness of breath.   Cardiovascular: Negative for chest pain, palpitations and leg swelling.  Gastrointestinal: Positive for heartburn. Negative for abdominal pain, blood in stool and nausea.  Genitourinary: Negative for dysuria and frequency.  Musculoskeletal: Negative for falls.  Skin: Negative for rash.  Neurological: Negative for dizziness, loss of consciousness and headaches.  Endo/Heme/Allergies: Negative for environmental allergies.  Psychiatric/Behavioral: Negative for depression. The patient is not nervous/anxious.        Objective:    Physical Exam Vitals and nursing note reviewed.  Constitutional:      General: She is not in acute distress.    Appearance: She is well-developed.  HENT:     Head: Normocephalic and atraumatic.     Nose: Nose normal.  Eyes:     General:        Right eye: No discharge.        Left eye: No discharge.  Cardiovascular:     Rate and Rhythm: Normal rate and regular rhythm.     Heart sounds: No murmur heard.   Pulmonary:     Effort: Pulmonary effort is normal.     Breath sounds: Normal breath  sounds.  Abdominal:     General: Bowel sounds are normal.     Palpations: Abdomen is soft.     Tenderness: There is no abdominal tenderness.  Musculoskeletal:     Cervical back: Normal range of motion and neck supple.  Skin:    General: Skin is warm and dry.  Neurological:     Mental Status: She is alert and oriented to person, place, and time.     BP 138/78   Pulse 66   Temp 97.6 F (36.4 C) (Oral)   Resp 16   Wt 92 lb 14.4 oz (42.1 kg)   SpO2 97%   BMI 18.76 kg/m  Wt Readings from Last 3 Encounters:  10/29/19 92 lb 14.4 oz (42.1 kg)  06/11/19 93 lb 6.4 oz (42.4 kg)  02/09/19 92 lb 9.6 oz (42 kg)    Diabetic Foot Exam - Simple   No data filed     Lab Results  Component Value Date   WBC 9.8 06/12/2019   HGB 11.7 (L) 06/12/2019   HCT 34.3 (L) 06/12/2019   PLT 193.0 06/12/2019   GLUCOSE 90 06/12/2019   CHOL 165 06/12/2019   TRIG 98.0 06/12/2019   HDL 40.40 06/12/2019   LDLDIRECT 72.1 01/19/2009   LDLCALC 105 (H) 06/12/2019   ALT 11 06/12/2019   AST 16 06/12/2019   NA 137 06/12/2019   K 4.7 06/12/2019   CL 100 06/12/2019   CREATININE 0.99 06/12/2019   BUN 18 06/12/2019   CO2 31 06/12/2019   TSH 3.09 06/12/2019   INR 2.1 04/25/2018   HGBA1C 5.7 06/12/2019    Lab Results  Component Value Date   TSH 3.09 06/12/2019   Lab Results  Component Value Date   WBC 9.8 06/12/2019   HGB 11.7 (L) 06/12/2019   HCT 34.3 (L)  06/12/2019   MCV 91.8 06/12/2019   PLT 193.0 06/12/2019   Lab Results  Component Value Date   NA 137 06/12/2019   K 4.7 06/12/2019   CO2 31 06/12/2019   GLUCOSE 90 06/12/2019   BUN 18 06/12/2019   CREATININE 0.99 06/12/2019   BILITOT 0.6 06/12/2019   ALKPHOS 16 (L) 06/12/2019   AST 16 06/12/2019   ALT 11 06/12/2019   PROT 7.1 06/12/2019   ALBUMIN 3.9 06/12/2019   CALCIUM 9.2 06/12/2019   ANIONGAP 9 07/02/2017   GFR 52.30 (L) 06/12/2019   Lab Results  Component Value Date   CHOL 165 06/12/2019   Lab Results  Component Value  Date   HDL 40.40 06/12/2019   Lab Results  Component Value Date   LDLCALC 105 (H) 06/12/2019   Lab Results  Component Value Date   TRIG 98.0 06/12/2019   Lab Results  Component Value Date   CHOLHDL 4 06/12/2019   Lab Results  Component Value Date   HGBA1C 5.7 06/12/2019       Assessment & Plan:   Problem List Items Addressed This Visit    Essential hypertension (Chronic)    Well controlled, no changes to meds. Encouraged heart healthy diet such as the DASH diet and exercise as tolerated.       Relevant Orders   CBC   Comprehensive metabolic panel   TSH   Hypothyroidism   Vitamin D deficiency    Supplement and monitor      Relevant Orders   VITAMIN D 25 Hydroxy (Vit-D Deficiency, Fractures)   Hyperlipidemia, mixed   Relevant Orders   Lipid panel   Persistent atrial fibrillation ([place])    Rate controlled and tolerating Eliquis      GERD    Avoid offending foods, start probiotics. Do not eat large meals in late evening and consider raising head of bed.       Renal insufficiency    Hydrate and monitor      Hyperglycemia   Relevant Orders   Hemoglobin A1c   Vitamin B12 deficiency    Supplement and monitor      Relevant Orders   Vitamin B12   Cough    Is better now but had been flared with some yellow sputum produced she will let us know if she worsens again       Other Visit Diagnoses    Influenza vaccine administered    -  Primary   Relevant Orders   Flu Vaccine QUAD High Dose(Fluad)      I am having [name] [name] maintain her Vitamin D, Vitamin B-12, famotidine, levocetirizine, metoprolol succinate, pantoprazole, ramipril, ALPRAZolam, hydrALAZINE, and Eliquis.  No orders of the defined types were placed in this encounter.    Penni Homans, MD

## 2019-10-29 NOTE — Assessment & Plan Note (Signed)
Well controlled, no changes to meds. Encouraged heart healthy diet such as the DASH diet and exercise as tolerated.  °

## 2019-10-30 ENCOUNTER — Other Ambulatory Visit: Payer: Self-pay

## 2019-10-30 DIAGNOSIS — Z23 Encounter for immunization: Secondary | ICD-10-CM

## 2019-10-30 DIAGNOSIS — N289 Disorder of kidney and ureter, unspecified: Secondary | ICD-10-CM | POA: Diagnosis not present

## 2019-10-30 DIAGNOSIS — I10 Essential (primary) hypertension: Secondary | ICD-10-CM | POA: Diagnosis not present

## 2019-10-30 LAB — VITAMIN B12: Vitamin B-12: 444 pg/mL (ref 200–1100)

## 2019-10-30 LAB — COMPREHENSIVE METABOLIC PANEL
AG Ratio: 1 (calc) (ref 1.0–2.5)
ALT: 8 U/L (ref 6–29)
AST: 14 U/L (ref 10–35)
Albumin: 3.4 g/dL — ABNORMAL LOW (ref 3.6–5.1)
Alkaline phosphatase (APISO): 17 U/L — ABNORMAL LOW (ref 37–153)
BUN/Creatinine Ratio: 13 (calc) (ref 6–22)
BUN: 15 mg/dL (ref 7–25)
CO2: 25 mmol/L (ref 20–32)
Calcium: 8.6 mg/dL (ref 8.6–10.4)
Chloride: 102 mmol/L (ref 98–110)
Creat: 1.15 mg/dL — ABNORMAL HIGH (ref 0.60–0.88)
Globulin: 3.4 g/dL (calc) (ref 1.9–3.7)
Glucose, Bld: 76 mg/dL (ref 65–99)
Potassium: 4.3 mmol/L (ref 3.5–5.3)
Sodium: 139 mmol/L (ref 135–146)
Total Bilirubin: 0.5 mg/dL (ref 0.2–1.2)
Total Protein: 6.8 g/dL (ref 6.1–8.1)

## 2019-10-30 LAB — CBC
HCT: 32.6 % — ABNORMAL LOW (ref 35.0–45.0)
Hemoglobin: 10.8 g/dL — ABNORMAL LOW (ref 11.7–15.5)
MCH: 30.6 pg (ref 27.0–33.0)
MCHC: 33.1 g/dL (ref 32.0–36.0)
MCV: 92.4 fL (ref 80.0–100.0)
MPV: 11.3 fL (ref 7.5–12.5)
Platelets: 247 10*3/uL (ref 140–400)
RBC: 3.53 10*6/uL — ABNORMAL LOW (ref 3.80–5.10)
RDW: 13.2 % (ref 11.0–15.0)
WBC: 8.9 10*3/uL (ref 3.8–10.8)

## 2019-10-30 LAB — HEMOGLOBIN A1C
Hgb A1c MFr Bld: 5.6 % of total Hgb (ref <5.7)
Mean Plasma Glucose: 114 (calc)
eAG (mmol/L): 6.3 (calc)

## 2019-10-30 LAB — LIPID PANEL
Cholesterol: 128 mg/dL (ref <200)
HDL: 36 mg/dL — ABNORMAL LOW (ref >=50)
LDL Cholesterol (Calc): 72 mg/dL (calc)
Non-HDL Cholesterol (Calc): 92 mg/dL (calc) (ref <130)
Total CHOL/HDL Ratio: 3.6 (calc) (ref <5.0)
Triglycerides: 114 mg/dL (ref <150)

## 2019-10-30 LAB — TSH: TSH: 3.37 mIU/L (ref 0.40–4.50)

## 2019-10-30 LAB — VITAMIN D 25 HYDROXY (VIT D DEFICIENCY, FRACTURES): Vit D, 25-Hydroxy: 36 ng/mL (ref 30–100)

## 2019-10-30 NOTE — Progress Notes (Signed)
B12 is already in medication list do you want to add the injections

## 2019-11-09 ENCOUNTER — Observation Stay (HOSPITAL_COMMUNITY)
Admission: EM | Admit: 2019-11-09 | Discharge: 2019-11-10 | Disposition: A | Discharge status: Home / Self Care | Payer: Medicare Other | Attending: Internal Medicine | Admitting: Internal Medicine

## 2019-11-09 ENCOUNTER — Other Ambulatory Visit: Payer: Self-pay

## 2019-11-09 ENCOUNTER — Encounter (HOSPITAL_COMMUNITY): Payer: Self-pay | Admitting: Internal Medicine

## 2019-11-09 ENCOUNTER — Emergency Department (HOSPITAL_COMMUNITY): Payer: Medicare Other

## 2019-11-09 DIAGNOSIS — R6889 Other general symptoms and signs: Secondary | ICD-10-CM | POA: Diagnosis not present

## 2019-11-09 DIAGNOSIS — Z7901 Long term (current) use of anticoagulants: Secondary | ICD-10-CM | POA: Diagnosis not present

## 2019-11-09 DIAGNOSIS — K449 Diaphragmatic hernia without obstruction or gangrene: Secondary | ICD-10-CM | POA: Diagnosis not present

## 2019-11-09 DIAGNOSIS — I480 Paroxysmal atrial fibrillation: Secondary | ICD-10-CM | POA: Insufficient documentation

## 2019-11-09 DIAGNOSIS — I10 Essential (primary) hypertension: Secondary | ICD-10-CM | POA: Diagnosis not present

## 2019-11-09 DIAGNOSIS — R001 Bradycardia, unspecified: Secondary | ICD-10-CM

## 2019-11-09 DIAGNOSIS — R0902 Hypoxemia: Secondary | ICD-10-CM | POA: Diagnosis not present

## 2019-11-09 DIAGNOSIS — E782 Mixed hyperlipidemia: Secondary | ICD-10-CM | POA: Diagnosis present

## 2019-11-09 DIAGNOSIS — R0789 Other chest pain: Secondary | ICD-10-CM | POA: Diagnosis not present

## 2019-11-09 DIAGNOSIS — I4819 Other persistent atrial fibrillation: Secondary | ICD-10-CM | POA: Diagnosis present

## 2019-11-09 DIAGNOSIS — Z79899 Other long term (current) drug therapy: Secondary | ICD-10-CM | POA: Insufficient documentation

## 2019-11-09 DIAGNOSIS — E039 Hypothyroidism, unspecified: Secondary | ICD-10-CM | POA: Diagnosis not present

## 2019-11-09 DIAGNOSIS — R079 Chest pain, unspecified: Principal | ICD-10-CM | POA: Diagnosis present

## 2019-11-09 DIAGNOSIS — Z20822 Contact with and (suspected) exposure to covid-19: Secondary | ICD-10-CM | POA: Insufficient documentation

## 2019-11-09 DIAGNOSIS — E46 Unspecified protein-calorie malnutrition: Secondary | ICD-10-CM | POA: Diagnosis present

## 2019-11-09 DIAGNOSIS — Z743 Need for continuous supervision: Secondary | ICD-10-CM | POA: Diagnosis not present

## 2019-11-09 LAB — COMPREHENSIVE METABOLIC PANEL
ALT: 12 U/L (ref 0–44)
AST: 17 U/L (ref 15–41)
Albumin: 2.7 g/dL — ABNORMAL LOW (ref 3.5–5.0)
Alkaline Phosphatase: 15 U/L — ABNORMAL LOW (ref 38–126)
Anion gap: 11 (ref 5–15)
BUN: 14 mg/dL (ref 8–23)
CO2: 25 mmol/L (ref 22–32)
Calcium: 8 mg/dL — ABNORMAL LOW (ref 8.9–10.3)
Chloride: 105 mmol/L (ref 98–111)
Creatinine, Ser: 0.99 mg/dL (ref 0.44–1.00)
GFR, Estimated: 53 mL/min — ABNORMAL LOW (ref >60)
Glucose, Bld: 101 mg/dL — ABNORMAL HIGH (ref 70–99)
Potassium: 3.5 mmol/L (ref 3.5–5.1)
Sodium: 141 mmol/L (ref 135–145)
Total Bilirubin: 0.5 mg/dL (ref 0.3–1.2)
Total Protein: 6.1 g/dL — ABNORMAL LOW (ref 6.5–8.1)

## 2019-11-09 LAB — TROPONIN I (HIGH SENSITIVITY)
Troponin I (High Sensitivity): 8 ng/L (ref <18)
Troponin I (High Sensitivity): 23 ng/L — ABNORMAL HIGH (ref <18)
Troponin I (High Sensitivity): 23 ng/L — ABNORMAL HIGH (ref <18)
Troponin I (High Sensitivity): 20 ng/L — ABNORMAL HIGH (ref <18)

## 2019-11-09 LAB — CBC WITH DIFFERENTIAL/PLATELET
Abs Immature Granulocytes: 0.02 10*3/uL (ref 0.00–0.07)
Basophils Absolute: 0 10*3/uL (ref 0.0–0.1)
Basophils Relative: 0 %
Eosinophils Absolute: 0 10*3/uL (ref 0.0–0.5)
Eosinophils Relative: 0 %
HCT: 32.1 % — ABNORMAL LOW (ref 36.0–46.0)
Hemoglobin: 10.1 g/dL — ABNORMAL LOW (ref 12.0–15.0)
Immature Granulocytes: 0 %
Lymphocytes Relative: 14 %
Lymphs Abs: 1.3 10*3/uL (ref 0.7–4.0)
MCH: 29.7 pg (ref 26.0–34.0)
MCHC: 31.5 g/dL (ref 30.0–36.0)
MCV: 94.4 fL (ref 80.0–100.0)
Monocytes Absolute: 0.5 10*3/uL (ref 0.1–1.0)
Monocytes Relative: 5 %
Neutro Abs: 7.6 10*3/uL (ref 1.7–7.7)
Neutrophils Relative %: 81 %
Platelets: 236 10*3/uL (ref 150–400)
RBC: 3.4 MIL/uL — ABNORMAL LOW (ref 3.87–5.11)
RDW: 14.1 % (ref 11.5–15.5)
WBC: 9.5 10*3/uL (ref 4.0–10.5)
nRBC: 0 % (ref 0.0–0.2)

## 2019-11-09 LAB — RESPIRATORY PANEL BY RT PCR (FLU A&B, COVID)
Influenza A by PCR: NEGATIVE
Influenza B by PCR: NEGATIVE
SARS Coronavirus 2 by RT PCR: NEGATIVE

## 2019-11-09 LAB — LIPASE, BLOOD: Lipase: 24 U/L (ref 11–51)

## 2019-11-09 MED ORDER — ALPRAZOLAM 0.25 MG PO TABS
0.2500 mg | ORAL_TABLET | Freq: Two times a day (BID) | ORAL | Status: DC | PRN
  Administered 2019-11-09: 0.25 mg via ORAL
  Filled 2019-11-09: qty 1

## 2019-11-09 MED ORDER — HYDRALAZINE HCL 10 MG PO TABS
10.0000 mg | ORAL_TABLET | Freq: Two times a day (BID) | ORAL | Status: DC
  Administered 2019-11-09 – 2019-11-10 (×3): 10 mg via ORAL
  Filled 2019-11-09 (×4): qty 1

## 2019-11-09 MED ORDER — ONDANSETRON HCL 4 MG/2ML IJ SOLN: 4.0000 mg | Freq: Four times a day (QID) | INTRAMUSCULAR | Status: DC | PRN

## 2019-11-09 MED ORDER — ACETAMINOPHEN 325 MG PO TABS: 650.0000 mg | ORAL_TABLET | ORAL | Status: DC | PRN

## 2019-11-09 MED ORDER — RAMIPRIL 10 MG PO CAPS
10.0000 mg | ORAL_CAPSULE | Freq: Two times a day (BID) | ORAL | Status: DC
  Administered 2019-11-09 – 2019-11-10 (×3): 10 mg via ORAL
  Filled 2019-11-09 (×3): qty 1

## 2019-11-09 MED ORDER — CETIRIZINE HCL 10 MG PO TABS
5.0000 mg | ORAL_TABLET | Freq: Every evening | ORAL | Status: DC
  Administered 2019-11-09: 5 mg via ORAL
  Filled 2019-11-09: qty 1

## 2019-11-09 MED ORDER — METOPROLOL SUCCINATE ER 25 MG PO TB24
25.0000 mg | ORAL_TABLET | Freq: Two times a day (BID) | ORAL | Status: DC
  Administered 2019-11-09 – 2019-11-10 (×3): 25 mg via ORAL
  Filled 2019-11-09 (×3): qty 1

## 2019-11-09 MED ORDER — FAMOTIDINE 20 MG PO TABS
40.0000 mg | ORAL_TABLET | Freq: Every day | ORAL | Status: DC
  Administered 2019-11-09 (×2): 40 mg via ORAL
  Filled 2019-11-09 (×2): qty 2

## 2019-11-09 MED ORDER — APIXABAN 2.5 MG PO TABS
2.5000 mg | ORAL_TABLET | Freq: Two times a day (BID) | ORAL | Status: DC
  Administered 2019-11-09 – 2019-11-10 (×3): 2.5 mg via ORAL
  Filled 2019-11-09 (×3): qty 1

## 2019-11-09 MED ORDER — ASPIRIN EC 325 MG PO TBEC
325.0000 mg | DELAYED_RELEASE_TABLET | Freq: Once | ORAL | Status: AC
  Administered 2019-11-09: 325 mg via ORAL
  Filled 2019-11-09: qty 1

## 2019-11-09 MED ORDER — PANTOPRAZOLE SODIUM 40 MG PO TBEC
40.0000 mg | DELAYED_RELEASE_TABLET | Freq: Every day | ORAL | Status: DC
  Administered 2019-11-09 – 2019-11-10 (×2): 40 mg via ORAL
  Filled 2019-11-09 (×2): qty 1

## 2019-11-09 NOTE — ED Notes (Signed)
Daughter [name] [name] went to get lunch, would like to be notified if anything happens. [phone]

## 2019-11-09 NOTE — H&P (Signed)
History and Physical    [name] [name] JME:[id number] DOB: 14-Feb-1924 DOA: 11/09/2019  PCP: Mosie Lukes, MD Consultants:  [name] - cardiology; Loletha Carrow - GI Patient coming from:  Home - lives with 2 daughters; NOK: Daughters  Chief Complaint: Chest pain  HPI: [name] [name] is a 86 y.o. female with medical history significant of afib; HTN; HLD; and hypothyroidism presenting with chest pain.  She reports that she developed L axillary pain.  It resolved spontaneously after she took ASA.  She was lying in bed after getting up to go to the bathroom.  It lasted about 1-2 hours.  No associated symptoms.  She feels back to normal now.  She was offered the option of cardiology consult in the ER with d/c to home or observation overnight, and she prefers overnight observation.    ED Course:  Chest pain since 0400.  EKG unremarkable.  Improved after NTG.  HS troponin 8, 23.  HR in 50s without afib.  Pain is resolved.  Review of Systems: As per HPI; otherwise review of systems reviewed and negative.   Ambulatory Status:  Ambulates without assistance or with a cane or walker  COVID Vaccine Status:  Complete  Past Medical History:  Diagnosis Date  . Anemia   . Anxiety   . Arthritis    hips, knees  . Current use of long term anticoagulation   . Diverticulitis of colon 02/06/2015  . Gait difficulty   . GERD (gastroesophageal reflux disease)   . Headache(784.0) 06/22/2012  . Hyperlipidemia   . Hypertension   . IBS (irritable bowel syndrome)   . Incontinence   . Loss of weight 06/05/2015  . Medicare annual wellness visit, subsequent 02/05/2014  . Melena   . Mild mitral regurgitation   . Mild tricuspid regurgitation   . Mitral and aortic regurgitation   . Mitral valve prolapse    hx of - not seen on recent echoes  . Osteopenia   . Paroxysmal atrial flutter ([place])   . Pedal edema 10/01/2016  . Persistent atrial fibrillation ([place])   . Personal history of colonic polyps   . Renal lithiasis  06/26/2015  . Sinus bradycardia   . Thyroid disease    hypo  . Vitamin B12 deficiency 03/16/2016  . Vitamin D deficiency     Past Surgical History:  Procedure Laterality Date  . BOTOX INJECTION N/A 09/03/2012   Procedure: BOTOX INJECTION;  Surgeon: Inda Castle, MD;  Location: [facility];  Service: Endoscopy;  Laterality: N/A;  . BREAST BIOPSY Left   . BREAST LUMPECTOMY Right 08/18/2014   Procedure: RIGHT BREAST LUMPECTOMY;  Surgeon: Coralie Keens, MD;  Location: [place];  Service: General;  Laterality: Right;  . CARDIAC ELECTROPHYSIOLOGY MAPPING AND ABLATION    . CATARACT EXTRACTION, BILATERAL    . ESOPHAGOGASTRODUODENOSCOPY N/A 09/03/2012   Procedure: ESOPHAGOGASTRODUODENOSCOPY (EGD);  Surgeon: Inda Castle, MD;  Location: [facility];  Service: Endoscopy;  Laterality: N/A;  . ESOPHAGOGASTRODUODENOSCOPY (EGD) WITH PROPOFOL N/A 12/02/2018   Procedure: ESOPHAGOGASTRODUODENOSCOPY (EGD) WITH PROPOFOL;  Surgeon: [name], MD;  Location: [facility];  Service: Gastroenterology;  Laterality: N/A;  . ESOPHAGOSCOPY W/ BOTOX INJECTION    . I & D EXTREMITY Right 11/06/2012   Procedure: IRRIGATION AND DEBRIDEMENT EXTREMITY Right Ring Finger;  Surgeon: Tennis Must, MD;  Location: [facility];  Service: Orthopedics;  Laterality: Right;  . PARTIAL HYSTERECTOMY     ovaries left in place  . skin cancer removal  Social History   Socioeconomic History  . Marital status: Widowed    Spouse name: Not on file  . Number of children: 5  . Years of education: Not on file  . Highest education level: Not on file  Occupational History  . Occupation: retired    Fish farm manager: RETIRED  Tobacco Use  . Smoking status: Never Smoker  . Smokeless tobacco: Never Used  Vaping Use  . Vaping Use: Never used  Substance and Sexual Activity  . Alcohol use: No  . Drug use: No  . Sexual activity: Not on file    Comment: lives with Daughter, grandson and his family. no dietary  restricitons.  Other Topics Concern  . Not on file  Social History Narrative  . Not on file   Social Determinants of Health   Financial Resource Strain:   . Difficulty of Paying Living Expenses: Not on file  Food Insecurity:   . Worried About Charity fundraiser in the Last Year: Not on file  . Ran Out of Food in the Last Year: Not on file  Transportation Needs:   . Lack of Transportation (Medical): Not on file  . Lack of Transportation (Non-Medical): Not on file  Physical Activity:   . Days of Exercise per Week: Not on file  . Minutes of Exercise per Session: Not on file  Stress:   . Feeling of Stress : Not on file  Social Connections:   . Frequency of Communication with Friends and Family: Not on file  . Frequency of Social Gatherings with Friends and Family: Not on file  . Attends Religious Services: Not on file  . Active Member of Clubs or Organizations: Not on file  . Attends Archivist Meetings: Not on file  . Marital Status: Not on file  Intimate Partner Violence:   . Fear of Current or Ex-Partner: Not on file  . Emotionally Abused: Not on file  . Physically Abused: Not on file  . Sexually Abused: Not on file    Allergies  Allergen Reactions  . Amlodipine Shortness Of Breath  . Darifenacin Hydrobromide Other (See Comments)    dizziness  . Sulfonamide Derivatives Other (See Comments)    dizziness  . Tramadol Other (See Comments)    Insomnia, anorexia    Family History  Problem Relation Age of Onset  . Diabetes Mother   . CVA Mother   . Stroke Mother   . COPD Father   . Rheum arthritis Father   . Allergies Daughter   . COPD Daughter   . Stroke Daughter   . COPD Daughter        previous smoker  . Stroke Maternal Grandfather   . Heart disease Maternal Aunt   . Heart disease Maternal Uncle   . Colon cancer Neg Hx   . Colon polyps Neg Hx   . Esophageal cancer Neg Hx   . Gallbladder disease Neg Hx   . Kidney disease Neg Hx   . Heart attack  Neg Hx   . Hypertension Neg Hx     Prior to Admission medications   Medication Sig Start Date End Date Taking? Authorizing Provider  ALPRAZolam (XANAX) 0.25 MG tablet TAKE 0.5-1 TABLETS (0.125-0.25 MG TOTAL) BY MOUTH 2 (TWO) TIMES DAILY AS NEEDED FOR ANXIETY. Patient taking differently: Take 0.25 mg by mouth 2 (two) times daily as needed for anxiety.  08/26/19  Yes Mosie Lukes, MD  Cholecalciferol (VITAMIN D) 2000 UNITS CAPS Take 2,000 Units by  mouth every other day. 02/20/12  Yes Mosie Lukes, MD  Cyanocobalamin (VITAMIN B-12) 500 MCG SUBL Place 1 tablet (500 mcg total) under the tongue daily at 2 PM. 06/22/16  Yes Mosie Lukes, MD  ELIQUIS 2.5 MG TABS tablet TAKE 1 TABLET BY MOUTH TWICE A DAY Patient taking differently: Take 2.5 mg by mouth 2 (two) times daily.  10/26/19  Yes Dorothy Spark, MD  hydrALAZINE (APRESOLINE) 10 MG tablet TAKE 1 TABLET BY MOUTH THREE TIMES A DAY Patient taking differently: Take 10 mg by mouth in the morning and at bedtime.  10/04/19  Yes Mosie Lukes, MD  levocetirizine (XYZAL) 5 MG tablet TAKE 1/2 TABLET (2.5 MG TOTAL) BY MOUTH EVERY EVENING. Patient taking differently: Take 2.5 mg by mouth every evening.  05/11/19  Yes Mosie Lukes, MD  metoprolol succinate (TOPROL-XL) 50 MG 24 hr tablet TAKE 1/2 TABLET BY MOUTH TWICE A DAY WITH OR IMMEDIATELY FOLLOWING A MEAL. Patient taking differently: Take 25 mg by mouth in the morning and at bedtime.  06/15/19  Yes Dorothy Spark, MD  pantoprazole (PROTONIX) 40 MG tablet TAKE 1 TABLET BY MOUTH EVERY DAY Patient taking differently: Take 40 mg by mouth daily. TAKE 1 TABLET BY MOUTH EVERY DAY 06/15/19  Yes Mosie Lukes, MD  ramipril (ALTACE) 10 MG capsule TAKE 1 CAPSULE (10 MG TOTAL) BY MOUTH 2 (TWO) TIMES DAILY. 08/26/19  Yes Mosie Lukes, MD  famotidine (PEPCID) 20 MG tablet Take 40 mg by mouth at bedtime.     [provider]    Physical Exam: Vitals:   11/09/19 1145 11/09/19 1150 11/09/19  1155 11/09/19 1246  BP: (!) 165/74     Pulse: 62 (!) 53 (!) 58   Resp: 11 15 20    Temp:      TempSrc:      SpO2: 100% 99% 99%   Weight:    42.1 kg  Height:    4\' 11"  (1.499 m)     . General:  Appears calm and comfortable and is NAD; she is quite frail and cachectic . Eyes:  PERRL, EOMI, normal lids, iris . ENT:  grossly normal hearing, lips & tongue, mmm; artificial dentition . Neck:  no LAD, masses or thyromegaly . Cardiovascular:  RR with mild bradycardia, no m/r/g. No LE edema.  Marland Kitchen Respiratory:   CTA bilaterally with no wheezes/rales/rhonchi.  Normal respiratory effort. . Abdomen:  soft, NT, ND, NABS . Skin:  no rash or induration seen on limited exam . Musculoskeletal:  grossly normal tone BUE/BLE, good ROM, no bony abnormality . Psychiatric:  grossly normal mood and affect, speech fluent and appropriate, AOx3 . Neurologic:  CN 2-12 grossly intact, moves all extremities in coordinated fashion    Radiological Exams on Admission: Independently reviewed - see discussion in A/P where applicable  DG Chest Portable 1 View  Result Date: 11/09/2019 CLINICAL DATA:  Chest pain. EXAM: PORTABLE CHEST 1 VIEW COMPARISON:  Chest x-ray 08/30/2017 FINDINGS: The cardiac silhouette, mediastinal and hilar contours are within normal limits and stable for age. There is moderate tortuosity and calcification of the thoracic aorta. The lungs are clear of an acute process. No worrisome pulmonary lesions or pneumothorax. Moderate to large hiatal hernia again noted. The bony thorax is intact. IMPRESSION: 1. No acute cardiopulmonary findings. 2. Moderate to large hiatal hernia. Electronically Signed   By: Marijo Sanes M.D.   On: 11/09/2019 07:30    EKG: Independently reviewed.   3810 -  NSR with rate 59; nonspecific ST changes with no evidence of acute ischemia 1114 - NSR with rate 59; nonspecific ST changes possibly related to LVH with early repolarization  Labs on Admission: I have personally reviewed  the available labs and imaging studies at the time of the admission.  Pertinent labs:   Albumin 2.7 HS troponin 8, 23 WBC 9.5 Hgb 10.1   Assessment/Plan Principal Problem:   Chest pain of uncertain etiology Active Problems:   Hypothyroidism   Hyperlipidemia, mixed   Essential hypertension   Persistent atrial fibrillation (HCC)   Malnutrition (HCC)   Chest pain -Patient with acute onset of left lateral CP that may have been exertional and resolved spontaneously. -1/3 typical symptoms suggestive of noncardiac chest pain.  -CXR unremarkable.   -Initial cardiac HS troponin negative; repeat was more elevated but with negative Delta.  Given the elevation, will continue to trend troponin for now.  -EKG not indicative of acute ischemia.   -HEART pathway score is 4, indicating that the patient has an elevated risk score and requires further evaluation. -Will plan to place in observation status on telemetry to rule out ACS by overnight observation. Patient was offered the option of ER cardiology consultation with probable d/c to home but preferred overnight observation. -Repeat HS troponin -Repeat EKG in AM -Continue Eliquis -Cardiology consultation requested  HTN -Continue home meds - hydralazine, Toprol XL, and Altace -Interestingly, she is taking hydralazine BID (usually more effective TID) and Toprol and Altace BID (usually effective as once daily meds) - it may be wortho consideration changing the dosing at least of the latter two  HLD -Lipids were checked on 10/28 (TC 128, HDL 36, LDL 72, TG 114) so will not repeat at this time -She is unlikely to benefit from a statin long-term given her age  Atrial fibrillation -Rate controlled with Toprol -Continue Eliquis  Hypothyroidism -Recent normal TSH -Continue Synthroid at current dose for now  Malnutrition -The patient's BMI is Body mass index is 18.75 kg/m..  -The patient has at least 2 indicators for malnutrition  (insufficient energy intake, weight loss, loss of muscle mass, loss of subcutaneous fat).  -This is likely due to starvation related.   -Will obtain a nutrition consult for further recommendations.    Note: This patient has been tested and is pending for the novel coronavirus COVID-19. She has been fully vaccinated against COVID-19.    DVT prophylaxis: Eliquis Code Status:  DNR - confirmed with patient Family Communication: None present Disposition Plan:  The patient is from: home  Anticipated d/c is to: home without [facility] services once her cardiology issues have been resolved.  Anticipated d/c date will depend on clinical response to treatment, but possibly as early as tomorrow if she has excellent response to treatment  Patient is currently: acutely ill Consults called: Cardiology; Nutrition  Admission status: It is my clinical opinion that referral for OBSERVATION is reasonable and necessary in this patient based on the above information provided. The aforementioned taken together are felt to place the patient at high risk for further clinical deterioration. However it is anticipated that the patient may be medically stable for discharge from the hospital within 24 to 48 hours.      Karmen Bongo MD [facility]   How to contact the [facility] Attending or Consulting provider [place] or covering provider during after hours [place], for this patient?  1. Check the care team in [facility] and look for a) attending/consulting [facility] provider listed  and b) the [facility] team listed 2. Log into [email or web address] and use [place]'s universal password to access. If you do not have the password, please contact the hospital operator. 3. Locate the [facility] provider you are looking for under [facility] and page to a number that you can be directly reached. 4. If you still have difficulty reaching the provider, please page the [facility] (Director on Call) for the Hospitalists listed on amion for  assistance.   11/09/2019, 1:32 PM

## 2019-11-09 NOTE — ED Provider Notes (Signed)
[facility] EMERGENCY DEPARTMENT Provider Note   CSN: [id number] Arrival date & time: 11/09/19  0631     History Chief Complaint  Patient presents with  . Chest Pain    [name] [name] is a 86 y.o. female.  The history is provided by the patient.  Chest Pain Pain location:  L chest Pain quality comment:  Unable to specify Pain radiates to:  Does not radiate Pain severity:  Mild (4/10) Onset quality:  Sudden Duration:  3 hours Timing:  Constant Progression:  Resolved Chronicity:  New Context: at rest   Relieved by:  Nitroglycerin Associated symptoms: cough and palpitations   Associated symptoms: no abdominal pain, no back pain, no diaphoresis, no fatigue, no fever, no headache, no lower extremity edema, no nausea, no shortness of breath, no vomiting and no weakness   Risk factors: high cholesterol and hypertension   Risk factors: no coronary artery disease and no prior DVT/PE     HPI: A 86 year old patient with a history of hypertension and hypercholesterolemia presents for evaluation of chest pain. Initial onset of pain was approximately 3-6 hours ago. The patient's chest pain is not worse with exertion and is relieved by nitroglycerin. The patient's chest pain is middle- or left-sided, is not well-localized, is not described as heaviness/pressure/tightness, is not sharp and does not radiate to the arms/jaw/neck. The patient does not complain of nausea and denies diaphoresis. The patient has no history of stroke, has no history of peripheral artery disease, has not smoked in the past 90 days, denies any history of treated diabetes, has no relevant family history of coronary artery disease (first degree relative at less than age 76) and does not have an elevated BMI (>=30).   Past Medical History:  Diagnosis Date  . Anemia   . Anxiety   . Arthritis    hips, knees  . Current use of long term anticoagulation   . Diverticulitis of colon 02/06/2015  . Gait  difficulty   . GERD (gastroesophageal reflux disease)   . Headache(784.0) 06/22/2012  . Hyperlipidemia   . Hypertension   . IBS (irritable bowel syndrome)   . Incontinence   . Loss of weight 06/05/2015  . Medicare annual wellness visit, subsequent 02/05/2014  . Melena   . Mild mitral regurgitation   . Mild tricuspid regurgitation   . Mitral and aortic regurgitation   . Mitral valve prolapse    hx of - not seen on recent echoes  . Osteopenia   . Paroxysmal atrial flutter ([place])   . Pedal edema 10/01/2016  . Persistent atrial fibrillation ([place])   . Personal history of colonic polyps   . Renal lithiasis 06/26/2015  . Sinus bradycardia   . Thyroid disease    hypo  . Vitamin B12 deficiency 03/16/2016  . Vitamin D deficiency     Patient Active Problem List   Diagnosis Date Noted  . Chest pain of uncertain etiology 35/36/1443  . Esophageal dysphagia 10/29/2018  . Cough 09/02/2017  . Hyponatremia 06/29/2017  . Bradycardia 06/29/2017  . Chest pain 12/10/2016  . Pedal edema 10/01/2016  . Preventative health care 03/18/2016  . Vitamin B12 deficiency 03/16/2016  . Renal lithiasis 06/26/2015  . Loss of weight 06/05/2015  . Diverticulitis of colon 02/06/2015  . Right-sided thoracic back pain 11/15/2014  . Nipple discharge 06/21/2014  . Rib pain on right side 04/29/2014  . Medicare annual wellness visit, subsequent 02/05/2014  . Abdominal pain, chronic, right lower quadrant 02/03/2014  .  Rectal bleeding 12/03/2013  . Prolapse of female pelvic organs 12/03/2013  . Leukocytes in urine 11/25/2013  . Abnormal chest x-ray 10/23/2013  . Pulmonary hypertension ([place]) 10/09/2013  . Tricuspid regurgitation 10/09/2013  . Chronic anticoagulation 10/09/2013  . Hyperglycemia 08/24/2013  . Encounter for therapeutic drug monitoring 03/13/2013  . Renal insufficiency 06/28/2012  . Hyperkalemia 06/28/2012  . Headache 06/22/2012  . Sinus bradycardia 09/20/2011  . Urinary frequency 02/13/2011  .  Arthritis 11/13/2010  . Long term (current) use of anticoagulants 03/31/2010  . DIZZINESS 08/03/2009  . Achalasia 05/18/2009  . DYSPHAGIA UNSPECIFIED 04/11/2009  . Anemia 01/19/2009  . ESOPHAGEAL STRICTURE 01/19/2009  . Anxiety state 09/15/2008  . MITRAL REGURGITATION, MILD 08/20/2008  . Persistent atrial fibrillation ([place]) 08/20/2008  . MITRAL VALVE PROLAPSE, HX OF 08/20/2008  . Vitamin D deficiency 01/15/2008  . IBS 02/03/2007  . Hypothyroidism 01/29/2007  . Hyperlipidemia, mixed 01/29/2007  . Essential hypertension 01/29/2007  . GERD 01/29/2007  . Disorder of bone and cartilage 01/29/2007  . COLONIC POLYPS, HX OF 01/29/2007    Past Surgical History:  Procedure Laterality Date  . BOTOX INJECTION N/A 09/03/2012   Procedure: BOTOX INJECTION;  Surgeon: Inda Castle, MD;  Location: [facility];  Service: Endoscopy;  Laterality: N/A;  . BREAST BIOPSY Left   . BREAST LUMPECTOMY Right 08/18/2014   Procedure: RIGHT BREAST LUMPECTOMY;  Surgeon: Coralie Keens, MD;  Location: [facility];  Service: General;  Laterality: Right;  . CARDIAC ELECTROPHYSIOLOGY MAPPING AND ABLATION    . CATARACT EXTRACTION, BILATERAL    . ESOPHAGOGASTRODUODENOSCOPY N/A 09/03/2012   Procedure: ESOPHAGOGASTRODUODENOSCOPY (EGD);  Surgeon: Inda Castle, MD;  Location: [facility];  Service: Endoscopy;  Laterality: N/A;  . ESOPHAGOGASTRODUODENOSCOPY (EGD) WITH PROPOFOL N/A 12/02/2018   Procedure: ESOPHAGOGASTRODUODENOSCOPY (EGD) WITH PROPOFOL;  Surgeon: [name], MD;  Location: [facility];  Service: Gastroenterology;  Laterality: N/A;  . ESOPHAGOSCOPY W/ BOTOX INJECTION    . I & D EXTREMITY Right 11/06/2012   Procedure: IRRIGATION AND DEBRIDEMENT EXTREMITY Right Ring Finger;  Surgeon: Tennis Must, MD;  Location: [facility];  Service: Orthopedics;  Laterality: Right;  . PARTIAL HYSTERECTOMY     ovaries left in place  . skin cancer removal       OB History   No obstetric history on  file.     Family History  Problem Relation Age of Onset  . Diabetes Mother   . CVA Mother   . Stroke Mother   . COPD Father   . Rheum arthritis Father   . Allergies Daughter   . COPD Daughter   . Stroke Daughter   . COPD Daughter        previous smoker  . Stroke Maternal Grandfather   . Heart disease Maternal Aunt   . Heart disease Maternal Uncle   . Colon cancer Neg Hx   . Colon polyps Neg Hx   . Esophageal cancer Neg Hx   . Gallbladder disease Neg Hx   . Kidney disease Neg Hx   . Heart attack Neg Hx   . Hypertension Neg Hx     Social History   Tobacco Use  . Smoking status: Never Smoker  . Smokeless tobacco: Never Used  Vaping Use  . Vaping Use: Never used  Substance Use Topics  . Alcohol use: No  . Drug use: No    Home Medications Prior to Admission medications   Medication Sig Start Date End Date Taking? Authorizing Provider  ALPRAZolam (  XANAX) 0.25 MG tablet TAKE 0.5-1 TABLETS (0.125-0.25 MG TOTAL) BY MOUTH 2 (TWO) TIMES DAILY AS NEEDED FOR ANXIETY. Patient taking differently: Take 0.25 mg by mouth 2 (two) times daily as needed for anxiety.  08/26/19  Yes Mosie Lukes, MD  Cholecalciferol (VITAMIN D) 2000 UNITS CAPS Take 2,000 Units by mouth every other day. 02/20/12  Yes Mosie Lukes, MD  ELIQUIS 2.5 MG TABS tablet TAKE 1 TABLET BY MOUTH TWICE A DAY Patient taking differently: Take 2.5 mg by mouth 2 (two) times daily.  10/26/19  Yes Dorothy Spark, MD  famotidine (PEPCID) 20 MG tablet Take 40 mg by mouth at bedtime.    Yes [provider]  hydrALAZINE (APRESOLINE) 10 MG tablet TAKE 1 TABLET BY MOUTH THREE TIMES A DAY Patient taking differently: Take 10 mg by mouth in the morning and at bedtime.  10/04/19  Yes Mosie Lukes, MD  levocetirizine (XYZAL) 5 MG tablet TAKE 1/2 TABLET (2.5 MG TOTAL) BY MOUTH EVERY EVENING. Patient taking differently: Take 2.5 mg by mouth every evening.  05/11/19  Yes Mosie Lukes, MD  metoprolol succinate  (TOPROL-XL) 50 MG 24 hr tablet TAKE 1/2 TABLET BY MOUTH TWICE A DAY WITH OR IMMEDIATELY FOLLOWING A MEAL. Patient taking differently: Take 25 mg by mouth in the morning and at bedtime.  06/15/19  Yes Dorothy Spark, MD  pantoprazole (PROTONIX) 40 MG tablet TAKE 1 TABLET BY MOUTH EVERY DAY Patient taking differently: Take 40 mg by mouth daily.  06/15/19  Yes Mosie Lukes, MD  ramipril (ALTACE) 10 MG capsule TAKE 1 CAPSULE (10 MG TOTAL) BY MOUTH 2 (TWO) TIMES DAILY. 08/26/19  Yes Mosie Lukes, MD  vitamin B-12 (CYANOCOBALAMIN) 1000 MCG tablet Take 1,000 mcg by mouth daily.   Yes [provider]  Cyanocobalamin (VITAMIN B-12) 500 MCG SUBL Place 1 tablet (500 mcg total) under the tongue daily at 2 PM. Patient not taking: Reported on 11/09/2019 06/22/16   Mosie Lukes, MD    Allergies    Amlodipine, Darifenacin hydrobromide, Sulfonamide derivatives, and Tramadol  Review of Systems   Review of Systems  Constitutional: Negative for diaphoresis, fatigue and fever.  HENT: Positive for sore throat. Negative for rhinorrhea.   Respiratory: Positive for cough. Negative for shortness of breath.   Cardiovascular: Positive for chest pain and palpitations. Negative for leg swelling.  Gastrointestinal: Negative for abdominal pain, diarrhea, nausea and vomiting.  Genitourinary: Negative for difficulty urinating.  Musculoskeletal: Negative for back pain and neck pain.  Skin: Negative for rash.  Neurological: Negative for weakness and headaches.  All other systems reviewed and are negative.   Physical Exam Updated Vital Signs BP (!) 165/74   Pulse (!) 58   Temp 98.2 F (36.8 C) (Oral)   Resp 20   SpO2 99%   Physical Exam Vitals reviewed.  Constitutional:      General: She is not in acute distress.    Appearance: She is well-developed and normal weight.  HENT:     Head: Normocephalic and atraumatic.     Nose: Nose normal.     Mouth/Throat:     Pharynx: Oropharynx is clear.    Eyes:     Conjunctiva/sclera: Conjunctivae normal.  Cardiovascular:     Rate and Rhythm: Normal rate.     Heart sounds: Normal heart sounds.  Pulmonary:     Effort: Pulmonary effort is normal. No respiratory distress.     Breath sounds: Normal breath sounds. No rhonchi  or rales.  Abdominal:     Palpations: Abdomen is soft.     Tenderness: There is no abdominal tenderness.  Musculoskeletal:        General: No swelling or tenderness.     Cervical back: Neck supple.     Right lower leg: No edema.     Left lower leg: No edema.  Skin:    General: Skin is warm and dry.  Neurological:     Mental Status: She is alert.  Psychiatric:        Mood and Affect: Mood normal.        Behavior: Behavior normal.     ED Results / Procedures / Treatments   Labs (all labs ordered are listed, but only abnormal results are displayed) Labs Reviewed  CBC WITH DIFFERENTIAL/PLATELET - Abnormal; Notable for the following components:      Result Value   RBC 3.40 (*)    Hemoglobin 10.1 (*)    HCT 32.1 (*)    All other components within normal limits  COMPREHENSIVE METABOLIC PANEL - Abnormal; Notable for the following components:   Glucose, Bld 101 (*)    Calcium 8.0 (*)    Total Protein 6.1 (*)    Albumin 2.7 (*)    Alkaline Phosphatase 15 (*)    GFR, Estimated 53 (*)    All other components within normal limits  TROPONIN I (HIGH SENSITIVITY) - Abnormal; Notable for the following components:   Troponin I (High Sensitivity) 23 (*)    All other components within normal limits  LIPASE, BLOOD  TROPONIN I (HIGH SENSITIVITY)    EKG EKG Interpretation  Date/Time:  Monday November 09 2019 07:09:40 EST Ventricular Rate:  59 PR Interval:    QRS Duration: 101 QT Interval:  431 QTC Calculation: 427 R Axis:   43 Text Interpretation: Sinus rhythm Non-specific ST-t changes Confirmed by Lajean Saver [phone]) on 11/09/2019 7:37:37 AM   Radiology DG Chest Portable 1 View  Result Date:  11/09/2019 CLINICAL DATA:  Chest pain. EXAM: PORTABLE CHEST 1 VIEW COMPARISON:  Chest x-ray 08/30/2017 FINDINGS: The cardiac silhouette, mediastinal and hilar contours are within normal limits and stable for age. There is moderate tortuosity and calcification of the thoracic aorta. The lungs are clear of an acute process. No worrisome pulmonary lesions or pneumothorax. Moderate to large hiatal hernia again noted. The bony thorax is intact. IMPRESSION: 1. No acute cardiopulmonary findings. 2. Moderate to large hiatal hernia. Electronically Signed   By: Marijo Sanes M.D.   On: 11/09/2019 07:30    Procedures Procedures (including critical care time)  Medications Ordered in ED Medications  aspirin EC tablet 325 mg (325 mg Oral Given 11/09/19 0800)    ED Course  I have reviewed the triage vital signs and the nursing notes.  Pertinent labs & imaging results that were available during my care of the patient were reviewed by me and considered in my medical decision making (see chart for details).    MDM Rules/Calculators/A&P HEAR Score: 5                        Medical Decision Making: KYLYNN STRE[address] is a 86 y.o. female who presented to the ED today with chest pain, left sided, sudden onset at rest at 4 am, non radiating. Pt denies SOB. Reports no history of similar CP in past.  Pt reports hx of increased chest fluttering over the past 3 weeks.   Past medical history  significant for HTN, hypothyroidism, HLD, persistent Afib (anticoagulated Eliquis), GERD, esophageal diverticulum and achalasia (s/p EGD with Botox Dec 2020), CKD Seen at PCP 10/28 with cough w yellow sputum, pt reports ongoing cough which she attributes to her GERD. Pt reports some sore throat for 1 week, denies feeling "sick" denies fever, chills, body aches   Seen by PCP with labs on 10/28 showing TSH normal 3.37  Reviewed and confirmed nursing documentation for past medical history, family history, social history.  On my  initial exam, the pt was resting comfortably, in NAD, normal WOB.   The patient did not receive 325 mg aspirin prior to arrival; therefore, I did provide aspirin in the ED. Symptomatic treatment with nitroglycerin was given by EMS prior to arrival. This did improve the patient's pain.  The ECG reveals no anatomical ischemia representing STEMI, New-Onset Arrhythmia, or ischemic equivalent. To further evaluate for ongoing myocardial ischemia, serial troponins will be ordered. The first of these is  not elevated.  Repeat troponin at 3-hours was elevated, 23 (up from 8).   Consulted Cardiology  Pt reports she has apt with her Cardiologist  Ena Dawley, MD  tomorrow.  The patient's presentation, the patient being hemodynamically stable, and the ECG are not consistent with Pericardial Tamponade. The patient's pain is not positional. This in conjunction with the lack of PR depressions and ST elevations on the ECG are reassuring against Pericarditis. The patient's non-elevated troponin and ECG are also inconsistent with Myocarditis.  The CXR is unremarkable for focal airspace disease.  The patient is afebrile and denies productive cough.  Therefore, I do not suspect Pneumonia. There is no evidence of Pneumothorax on physical exam or on the CXR. CXR shows no evidence of Esophageal Tear and there is no recent intractable emesis or esophageal instrumentation. There is no peritonitis or free air on CXR worrisome for a Perforated Abdominal Viscous.  I do not think that the patient has a Pulmonary Embolism. CP resolved. No SOB, HR < 100, SpO2% > 95%, no unilateral leg swelling, no hemoptysis, no surgery or trauma requiring anesthesia within the past 4 weeks, no history of prior PE or DVT, and no hormone use. Pt is on anticoagulation with Eliquis.   The patient's pain is not described as tearing and it does not radiate to back. Pulses are present bilaterally in both the upper and lower extremities. CXR does not  show a widened mediastinum. I have a very low suspicion for Aortic Dissection.   The patient was appropriately risk stratified as HEAR score of 5.    Due to this, the patient will require continued observation care and subsequent cardiac stress testing for further indications of underlying pathology.   All radiology and laboratory studies reviewed independently and with my attending physician, agree with reading provided by radiologist unless otherwise noted.    Based on the above findings, I believe patient requires admission.   The above care was discussed with and agreed upon by my attending physician.  Emergency Department Medication Summary:  Medications  aspirin EC tablet 325 mg (325 mg Oral Given 11/09/19 0800)       Final Clinical Impression(s) / ED Diagnoses Final diagnoses:  Chest pain, unspecified type    Rx / DC Orders ED Discharge Orders    None       Roosevelt Locks, MD 11/09/19 1214    Lajean Saver, MD 11/09/19 1413

## 2019-11-09 NOTE — ED Triage Notes (Signed)
Pt from home with EMS; describes at steady left side CP; never had before: rate as 4:10.

## 2019-11-09 NOTE — Consult Note (Addendum)
Cardiology Consultation:   Patient ID: [name] [name]; [id number]; 1924-08-28   Admit date: 11/09/2019 Date of Consult: 11/09/2019  Primary Care Provider: Mosie Lukes, MD Primary Cardiologist: Dr. Ena Dawley, MD   Patient Profile:   [name] [name] is a 86 y.o. female with a hx of persistent atrial fibrillation/atrial flutter on AC, HTN, HLD, hx of bradycardia, GERD, and hypothyroidism who is being seen today for the evaluation of chest pain at the request of Dr. Lorin Mercy.   History of Present Illness:   [name] [name] is a 86yo F with a hx as stated above who presented to [facility] with chest pain that began early this AM at approximately 4am after getting up to go to the restroom. She was lying in bed and began having left axillary/breast pain that lasted until ED arrival when she was given ASA and SL NTG. She reports that the pain has not recurred. She has had no associated symptoms such as fatigue, SOB, LE edema, N/V, diaphoresis, dizziness, recent cough, fever or chills. She has never had chest pain symptoms before.   In the ED, EKG was without acute changes. hsT found to be negative at 8>>23>>23 flat trend not consistent with ACS. CXR with no acute cardiopulmonary changes. She is currently chest pain free.   She is followed by Dr. [name] for her cardiology care. She was felt to have had chronic atrial fib however EKGs in 2018 and 2019 showed sinus bradycardia. She is in SB today as well. Echocardiogram in 2018 showed EF 55-60%, mild MR, moderate LAE, mild TR, normal PASP. She was seen in the ER 06/20/17 with discomfort in chest, upper abdomen and back. CT angio chest/abd/pelvis showed no acute dissection/aneurysm, positive for atherosclerotic vascular disease. She was previously treated for SIADH during a hospitalization for weakness and decreased appetite. She was found to be bradycardic therefore her beta blocker was held with subsequent tachycardia in the 100's.   She was then seen in  hospital consultation by Dr. Marlou Porch who felt that her rhythm was actually atrial flutter appearing. He advised continued rate control on low-dose Toprol and watch for bradycardia. In f/u 02/2018 she was felt to be doing well overall. Coumadin was switched to Eliquis during begininng of Covid pandemic.   She was most recently seen in follow up 02/09/19 with Dr. [name]. She had worn an event monitor 09/2018 that showed persistent atrial flutter with variable block with a minimum HR 45bpm and max at 136bpm. She had one episode of VT lasting 8 beats. She was planning to undergo an endoscopy for esophageal stricture and diverticulum.   Past Medical History:  Diagnosis Date  . Anemia   . Anxiety   . Arthritis    hips, knees  . Current use of long term anticoagulation   . Diverticulitis of colon 02/06/2015  . Gait difficulty   . GERD (gastroesophageal reflux disease)   . Headache(784.0) 06/22/2012  . Hyperlipidemia   . Hypertension   . IBS (irritable bowel syndrome)   . Incontinence   . Loss of weight 06/05/2015  . Medicare annual wellness visit, subsequent 02/05/2014  . Melena   . Mild mitral regurgitation   . Mild tricuspid regurgitation   . Mitral and aortic regurgitation   . Mitral valve prolapse    hx of - not seen on recent echoes  . Osteopenia   . Paroxysmal atrial flutter ([place])   . Pedal edema 10/01/2016  . Persistent atrial fibrillation ([place])   .  Personal history of colonic polyps   . Renal lithiasis 06/26/2015  . Sinus bradycardia   . Thyroid disease    hypo  . Vitamin B12 deficiency 03/16/2016  . Vitamin D deficiency     Past Surgical History:  Procedure Laterality Date  . BOTOX INJECTION N/A 09/03/2012   Procedure: BOTOX INJECTION;  Surgeon: Inda Castle, MD;  Location: [facility];  Service: Endoscopy;  Laterality: N/A;  . BREAST BIOPSY Left   . BREAST LUMPECTOMY Right 08/18/2014   Procedure: RIGHT BREAST LUMPECTOMY;  Surgeon: Coralie Keens, MD;  Location: [place];  Service: General;  Laterality: Right;  . CARDIAC ELECTROPHYSIOLOGY MAPPING AND ABLATION    . CATARACT EXTRACTION, BILATERAL    . ESOPHAGOGASTRODUODENOSCOPY N/A 09/03/2012   Procedure: ESOPHAGOGASTRODUODENOSCOPY (EGD);  Surgeon: Inda Castle, MD;  Location: [facility];  Service: Endoscopy;  Laterality: N/A;  . ESOPHAGOGASTRODUODENOSCOPY (EGD) WITH PROPOFOL N/A 12/02/2018   Procedure: ESOPHAGOGASTRODUODENOSCOPY (EGD) WITH PROPOFOL;  Surgeon: [name], MD;  Location: [facility];  Service: Gastroenterology;  Laterality: N/A;  . ESOPHAGOSCOPY W/ BOTOX INJECTION    . I & D EXTREMITY Right 11/06/2012   Procedure: IRRIGATION AND DEBRIDEMENT EXTREMITY Right Ring Finger;  Surgeon: Tennis Must, MD;  Location: [place];  Service: Orthopedics;  Laterality: Right;  . PARTIAL HYSTERECTOMY     ovaries left in place  . skin cancer removal       Prior to Admission medications   Medication Sig Start Date End Date Taking? Authorizing Provider  ALPRAZolam (XANAX) 0.25 MG tablet TAKE 0.5-1 TABLETS (0.125-0.25 MG TOTAL) BY MOUTH 2 (TWO) TIMES DAILY AS NEEDED FOR ANXIETY. Patient taking differently: Take 0.25 mg by mouth 2 (two) times daily as needed for anxiety.  08/26/19  Yes Mosie Lukes, MD  Cholecalciferol (VITAMIN D) 2000 UNITS CAPS Take 2,000 Units by mouth every other day. 02/20/12  Yes Mosie Lukes, MD  ELIQUIS 2.5 MG TABS tablet TAKE 1 TABLET BY MOUTH TWICE A DAY Patient taking differently: Take 2.5 mg by mouth 2 (two) times daily.  10/26/19  Yes Dorothy Spark, MD  famotidine (PEPCID) 20 MG tablet Take 40 mg by mouth at bedtime.    Yes [provider]  hydrALAZINE (APRESOLINE) 10 MG tablet TAKE 1 TABLET BY MOUTH THREE TIMES A DAY Patient taking differently: Take 10 mg by mouth in the morning and at bedtime.  10/04/19  Yes Mosie Lukes, MD  levocetirizine (XYZAL) 5 MG tablet TAKE 1/2 TABLET (2.5 MG TOTAL) BY MOUTH EVERY EVENING. Patient taking differently: Take 2.5  mg by mouth every evening.  05/11/19  Yes Mosie Lukes, MD  metoprolol succinate (TOPROL-XL) 50 MG 24 hr tablet TAKE 1/2 TABLET BY MOUTH TWICE A DAY WITH OR IMMEDIATELY FOLLOWING A MEAL. Patient taking differently: Take 25 mg by mouth in the morning and at bedtime.  06/15/19  Yes Dorothy Spark, MD  pantoprazole (PROTONIX) 40 MG tablet TAKE 1 TABLET BY MOUTH EVERY DAY Patient taking differently: Take 40 mg by mouth daily.  06/15/19  Yes Mosie Lukes, MD  ramipril (ALTACE) 10 MG capsule TAKE 1 CAPSULE (10 MG TOTAL) BY MOUTH 2 (TWO) TIMES DAILY. 08/26/19  Yes Mosie Lukes, MD  vitamin B-12 (CYANOCOBALAMIN) 1000 MCG tablet Take 1,000 mcg by mouth daily.   Yes [provider]    Inpatient Medications: Scheduled Meds: . apixaban  2.5 mg Oral BID  . cetirizine  5 mg Oral QPM  .  famotidine  40 mg Oral QHS  . hydrALAZINE  10 mg Oral BID  . metoprolol succinate  25 mg Oral BID  . pantoprazole  40 mg Oral Daily  . ramipril  10 mg Oral BID   Continuous Infusions:  PRN Meds: acetaminophen, ALPRAZolam, ondansetron (ZOFRAN) IV  Allergies:    Allergies  Allergen Reactions  . Amlodipine Shortness Of Breath  . Darifenacin Hydrobromide Other (See Comments)    dizziness  . Sulfonamide Derivatives Other (See Comments)    dizziness  . Tramadol Other (See Comments)    Insomnia, anorexia    Social History:   Social History   Socioeconomic History  . Marital status: Widowed    Spouse name: Not on file  . Number of children: 5  . Years of education: Not on file  . Highest education level: Not on file  Occupational History  . Occupation: retired    Fish farm manager: RETIRED  Tobacco Use  . Smoking status: Never Smoker  . Smokeless tobacco: Never Used  Vaping Use  . Vaping Use: Never used  Substance and Sexual Activity  . Alcohol use: No  . Drug use: No  . Sexual activity: Not on file    Comment: lives with Daughter, grandson and his family. no dietary restricitons.  Other  Topics Concern  . Not on file  Social History Narrative  . Not on file   Social Determinants of Health   Financial Resource Strain:   . Difficulty of Paying Living Expenses: Not on file  Food Insecurity:   . Worried About Charity fundraiser in the Last Year: Not on file  . Ran Out of Food in the Last Year: Not on file  Transportation Needs:   . Lack of Transportation (Medical): Not on file  . Lack of Transportation (Non-Medical): Not on file  Physical Activity:   . Days of Exercise per Week: Not on file  . Minutes of Exercise per Session: Not on file  Stress:   . Feeling of Stress : Not on file  Social Connections:   . Frequency of Communication with Friends and Family: Not on file  . Frequency of Social Gatherings with Friends and Family: Not on file  . Attends Religious Services: Not on file  . Active Member of Clubs or Organizations: Not on file  . Attends Archivist Meetings: Not on file  . Marital Status: Not on file  Intimate Partner Violence:   . Fear of Current or Ex-Partner: Not on file  . Emotionally Abused: Not on file  . Physically Abused: Not on file  . Sexually Abused: Not on file    Family History:   Family History  Problem Relation Age of Onset  . Diabetes Mother   . CVA Mother   . Stroke Mother   . COPD Father   . Rheum arthritis Father   . Allergies Daughter   . COPD Daughter   . Stroke Daughter   . COPD Daughter        previous smoker  . Stroke Maternal Grandfather   . Heart disease Maternal Aunt   . Heart disease Maternal Uncle   . Colon cancer Neg Hx   . Colon polyps Neg Hx   . Esophageal cancer Neg Hx   . Gallbladder disease Neg Hx   . Kidney disease Neg Hx   . Heart attack Neg Hx   . Hypertension Neg Hx    Family Status:  Family Status  Relation Name Status  .  Mother  Deceased at age 21       CVA, diabetes  . Father  Deceased at age 37       complications of COPD, smoker  . Daughter  Alive       1, [place]  .  Daughter  Deceased at age 24        smoker  . Daughter  Alive       80  . Daughter  Alive       53  . MGF  Deceased at age 65s  . MGM  Deceased at age 19  . PGM  Deceased at age 48  . PGF  Deceased  . Mat Aunt  (Not Specified)  . Mat Uncle  (Not Specified)  . Neg Hx  (Not Specified)    ROS:  Please see the history of present illness.  All other ROS reviewed and negative.     Physical Exam/Data:   Vitals:   11/09/19 1430 11/09/19 1435 11/09/19 1440 11/09/19 1445  BP: 126/75   (!) 136/55  Pulse: 95 93 (!) 57 61  Resp: (!) 22 11 17  (!) 25  Temp:      TempSrc:      SpO2: 91% 92% 97% 96%  Weight:      Height:       No intake or output data in the 24 hours ending 11/09/19 1608 Filed Weights   11/09/19 1246  Weight: 42.1 kg   Body mass index is 18.75 kg/m.   General: Elderly,  NAD Skin: Warm, dry, intact  Neck: Negative for carotid bruits. No JVD Lungs:Clear to ausculation bilaterally. No wheezes, rales, or rhonchi. Breathing is unlabored. Cardiovascular: RRR with S1 S2. No murmurs Abdomen: Soft, non-tender, non-distended. No obvious abdominal masses. Extremities: No edema. No clubbing or cyanosis. DP/PT pulses 2+ bilaterally Neuro: Alert and oriented. No focal deficits. No facial asymmetry. MAE spontaneously. Psych: Responds to questions appropriately with normal affect.     EKG:  The EKG was personally reviewed and demonstrates:  11/09/19 SB with no acute changes  Telemetry:  Telemetry was personally reviewed and demonstrates: 11/09/19 SB with rates in the 50's   Relevant CV Studies:  ZIO monitor 09/05/2018:   Persistent atrial flutter with variable block with minimum HR 45, maximum HR 136 and average HR 65 BPM.  1 episode of ventricular tachycardia lasting 8 beats.   Persistent atrial flutter with variable block with minimum HR 45, maximum HR 136 and average HR 65 BPM. Continue current medical management.    Echocardiogram 12/10/2016:  - Left ventricle: The  cavity size was normal. There was mild  concentric hypertrophy. Systolic function was normal. The  estimated ejection fraction was in the range of 55% to 60%. Wall  motion was normal; there were no regional wall motion  abnormalities. The study is not technically sufficient to allow  evaluation of LV diastolic function.  - Aortic valve: Transvalvular velocity was within the normal range.  There was no stenosis. There was no regurgitation.  - Mitral valve: Transvalvular velocity was within the normal range.  There was no evidence for stenosis. There was mild regurgitation.  - Left atrium: The atrium was moderately dilated.  - Right ventricle: Systolic function was normal.  - Atrial septum: No defect or patent foramen ovale was identified.  - Tricuspid valve: There was mild regurgitation.  - Pulmonary arteries: Systolic pressure was within the normal  range. PA peak pressure: 36 mm Hg (S).   Laboratory Data:  Chemistry Recent Labs  Lab 11/09/19 0656  NA 141  K 3.5  CL 105  CO2 25  GLUCOSE 101*  BUN 14  CREATININE 0.99  CALCIUM 8.0*  GFRNONAA 53*  ANIONGAP 11    Total Protein  Date Value Ref Range Status  11/09/2019 6.1 (L) 6.5 - 8.1 g/dL Final   Albumin  Date Value Ref Range Status  11/09/2019 2.7 (L) 3.5 - 5.0 g/dL Final   AST  Date Value Ref Range Status  11/09/2019 17 15 - 41 U/L Final   ALT  Date Value Ref Range Status  11/09/2019 12 0 - 44 U/L Final   Alkaline Phosphatase  Date Value Ref Range Status  11/09/2019 15 (L) 38 - 126 U/L Final   Total Bilirubin  Date Value Ref Range Status  11/09/2019 0.5 0.3 - 1.2 mg/dL Final   Hematology Recent Labs  Lab 11/09/19 0656  WBC 9.5  RBC 3.40*  HGB 10.1*  HCT 32.1*  MCV 94.4  MCH 29.7  MCHC 31.5  RDW 14.1  PLT 236   Cardiac EnzymesNo results for input(s): TROPONINI in the last 168 hours. No results for input(s): TROPIPOC in the last 168 hours.  BNPNo results for input(s): BNP, PROBNP  in the last 168 hours.  DDimer No results for input(s): DDIMER in the last 168 hours. TSH:  Lab Results  Component Value Date   TSH 3.37 10/29/2019   Lipids: Lab Results  Component Value Date   CHOL 128 10/29/2019   HDL 36 (L) 10/29/2019   LDLCALC 72 10/29/2019   LDLDIRECT 72.1 01/19/2009   TRIG 114 10/29/2019   CHOLHDL 3.6 10/29/2019   HgbA1c: Lab Results  Component Value Date   HGBA1C 5.6 10/29/2019    Radiology/Studies:  DG Chest Portable 1 View  Result Date: 11/09/2019 CLINICAL DATA:  Chest pain. EXAM: PORTABLE CHEST 1 VIEW COMPARISON:  Chest x-ray 08/30/2017 FINDINGS: The cardiac silhouette, mediastinal and hilar contours are within normal limits and stable for age. There is moderate tortuosity and calcification of the thoracic aorta. The lungs are clear of an acute process. No worrisome pulmonary lesions or pneumothorax. Moderate to large hiatal hernia again noted. The bony thorax is intact. IMPRESSION: 1. No acute cardiopulmonary findings. 2. Moderate to large hiatal hernia. Electronically Signed   By: Marijo Sanes M.D.   On: 11/09/2019 07:30   Assessment and Plan:   1. Chest pain: -Pt describes one episode of chest pain which began this morning approximately 4am. She states that pain stayed in her anterior left breast area without radiation or associated symptoms. On ED arrival she was given ASA and SL NTG with relief. EKG with no acute changes. hsT found to be negative at 8>>23>>>23, flat trend not consistent with ACS. Would recommend ED discharge from a cardiology standpoint with consideration of OP stress testing if chest pain recurrence. -Prior chest CT 06/2017 with moderate-to-marked aortic atherosclerosis and coronary vascular calcification.  -Would recommend reducing Torpol to 25mg  QD and adding amlodipine 2.5mg  for anti anginal benefits.  -She has follow up with Dr. [name] tomorrow at [phone] which was previously scheduled.   2. Hx of atrial  fibrillation/flutter: -Maintaining NSR/SB per telemetry, HRs in the 50's currently  -Reduce Toprol to 25mg  PO QD    3. HTN: -Elevated on presentation with normalization now -Reduce Toprol to 25mg  QD due to bradycardia and add low dose amlodipine for anti anginal benefit with close follow up tomorrow with Dr. [name]   4. Mild MR: -No  significant murmur on exam  -Consider OP echocardiogram for follow up    For questions or updates, please contact [place] Please consult [email or web address] for contact info under Cardiology/STEMI.   Lyndel Safe NP-C [facility] Pager: [phone] 11/09/2019 4:08 PM    Patient seen and examined. Agree with assessment and plan.  Ms. Summerson is a very pleasant 86 year old female who is followed by [place] [facility] Dr. [name] for her cardiology care.  She has a history in the past of atrial fibrillation/flutter and recently has been on metoprolol succinate 25 mg twice a day.  She had presented today with somewhat atypical left axillary chest pain.  The pain initiated when she was in bed.  She denies any exertional chest pain history or significant exertional symptoms.  Her ECG is unremarkable without acute ST changes and her high-sensitivity troponins essentially are within normal limits with minimal flat plateau increase to 23.  She has been bradycardic with heart rates in the upper 50s and has had some blood pressure lability initially being more elevated and now around 902 systolically.  She is pain-free and denies any shortness of breath.  On exam she is in no distress.  She is maintaining sinus rhythm.  JVD approximated 7 cm.  She was kyphotic, lungs clear.  Rhythm is regular with a 1/6 systolic murmur.  There was no S3 gallop.  She did not have chest wall tenderness.  Abdomen was soft and nontender.  Bowel sounds are present in 4 quadrants.  There was no edema.  Laboratory revealed a potassium of 3.5.  Renal function was stable with a creatinine of 0.99 and  estimated GFR at 53.  LDL cholesterol 72.  Presently, I believe patient is stable.  With her bradycardia I have suggested slight reduction in metoprolol succinate from 25 mg twice a day to just 25 mg in the morning.  I will also recommend initiation of low-dose amlodipine 2.5 mg which will also be helpful for blood pressure control as well as potential ischemia.  The patient has a already scheduled appointment to see Dr. Ena Dawley tomorrow in our office.  I feel she is stable to be discharged today with plans to follow-up with Dr. [name] in the office setting tomorrow.   Troy Sine, MD, [facility] 11/09/2019 5:29 PM

## 2019-11-10 ENCOUNTER — Other Ambulatory Visit: Payer: Self-pay | Admitting: *Deleted

## 2019-11-10 ENCOUNTER — Ambulatory Visit: Payer: Medicare Other | Admitting: Cardiology

## 2019-11-10 ENCOUNTER — Telehealth: Payer: Self-pay | Admitting: Cardiology

## 2019-11-10 DIAGNOSIS — I4819 Other persistent atrial fibrillation: Secondary | ICD-10-CM

## 2019-11-10 DIAGNOSIS — E440 Moderate protein-calorie malnutrition: Secondary | ICD-10-CM | POA: Diagnosis not present

## 2019-11-10 DIAGNOSIS — E782 Mixed hyperlipidemia: Secondary | ICD-10-CM | POA: Diagnosis not present

## 2019-11-10 DIAGNOSIS — R079 Chest pain, unspecified: Secondary | ICD-10-CM | POA: Diagnosis not present

## 2019-11-10 DIAGNOSIS — I10 Essential (primary) hypertension: Secondary | ICD-10-CM | POA: Diagnosis not present

## 2019-11-10 MED ORDER — METOPROLOL SUCCINATE ER 50 MG PO TB24
25.0000 mg | ORAL_TABLET | Freq: Every day | ORAL | 0 refills | Status: DC
Start: 2019-11-10 — End: 2020-02-10

## 2019-11-10 NOTE — Telephone Encounter (Signed)
[name] [name], Daughter of the patient called. The patient was scheduled for a visit with Dr. [name] today but was not released from the ER until after her appt.  The daughter wanted to know when the patient would need to follow up with Dr. [name].  [name] [name] is with the patient now and can arrange future appointments. Please advise

## 2019-11-10 NOTE — Discharge Summary (Addendum)
Physician Discharge Summary  [name] [name] NLZ:[id number] DOB: 1924-08-27 DOA: 11/09/2019  PCP: Mosie Lukes, MD  Admit date: 11/09/2019 Discharge date: 11/10/2019  Admitted From: Home Disposition:  Home   Recommendations for Outpatient Follow-up:  1. Follow up with cards in 1-2 weeks, stress test as an outpatient. 2. Please obtain BMP/CBC in one week   Home Health:No Equipment/Devices:None  Discharge Condition:Stable CODE STATUS:Full Diet recommendation: Heart Healthy   Brief/Interim Summary: 86 y.o. female past medical history significant for atrial fibrillation on Eliquis,Essential hypertension hypothyroidism presents with chest pain.  Discharge Diagnoses:  Principal Problem:   Chest pain of uncertain etiology Active Problems:   Hypothyroidism   Hyperlipidemia, mixed   Essential hypertension   Persistent atrial fibrillation (HCC)   Malnutrition (HCC)  Chest pain of unclear etiology: EKG showed normal sinus rhythm with no T-segment abnormalities no signs of ACS, her cardiac biomarkers have remained basically flat now the last 1 is trending down, chest x-ray shows no acute findings. Cardiology was consulted recommended a stress as an outpatient she will follow up with Dr. Nursing for follow-up.  History of A. fib a flutter: Currently in sinus rhythm, beta-blocker was reduced.  Essential hypertension: Her metoprolol was decreased to 25 mg daily.  Follow-up with cardiology and titrate antihypertensive medication as tolerated.  Mild MR: 2D echo and outpatient follow-up with cardiology.  Discharge Instructions  Discharge Instructions    Diet - low sodium heart healthy   Complete by: As directed    Increase activity slowly   Complete by: As directed      Allergies as of 11/10/2019      Reactions   Amlodipine Shortness Of Breath   Darifenacin Hydrobromide Other (See Comments)   dizziness   Sulfonamide Derivatives Other (See Comments)   dizziness   Tramadol  Other (See Comments)   Insomnia, anorexia      Medication List    TAKE these medications   ALPRAZolam 0.25 MG tablet Commonly known as: XANAX TAKE 0.5-1 TABLETS (0.125-0.25 MG TOTAL) BY MOUTH 2 (TWO) TIMES DAILY AS NEEDED FOR ANXIETY. What changed: how much to take   Eliquis 2.5 MG Tabs tablet Generic drug: apixaban TAKE 1 TABLET BY MOUTH TWICE A DAY What changed: how much to take   famotidine 20 MG tablet Commonly known as: PEPCID Take 40 mg by mouth at bedtime.   hydrALAZINE 10 MG tablet Commonly known as: APRESOLINE TAKE 1 TABLET BY MOUTH THREE TIMES A DAY What changed: when to take this   levocetirizine 5 MG tablet Commonly known as: XYZAL TAKE 1/2 TABLET (2.5 MG TOTAL) BY MOUTH EVERY EVENING. What changed: See the new instructions.   metoprolol succinate 50 MG 24 hr tablet Commonly known as: TOPROL-XL Take 0.5 tablets (25 mg total) by mouth daily. What changed: See the new instructions.   pantoprazole 40 MG tablet Commonly known as: PROTONIX TAKE 1 TABLET BY MOUTH EVERY DAY   ramipril 10 MG capsule Commonly known as: ALTACE TAKE 1 CAPSULE (10 MG TOTAL) BY MOUTH 2 (TWO) TIMES DAILY.   vitamin B-12 1000 MCG tablet Commonly known as: CYANOCOBALAMIN Take 1,000 mcg by mouth daily.   Vitamin D 50 MCG (2000 UT) Caps Take 2,000 Units by mouth every other day.       Allergies  Allergen Reactions  . Amlodipine Shortness Of Breath  . Darifenacin Hydrobromide Other (See Comments)    dizziness  . Sulfonamide Derivatives Other (See Comments)    dizziness  . Tramadol Other (See Comments)  Insomnia, anorexia    Consultations:  Cardiology   Procedures/Studies: DG Chest Portable 1 View  Result Date: 11/09/2019 CLINICAL DATA:  Chest pain. EXAM: PORTABLE CHEST 1 VIEW COMPARISON:  Chest x-ray 08/30/2017 FINDINGS: The cardiac silhouette, mediastinal and hilar contours are within normal limits and stable for age. There is moderate tortuosity and calcification  of the thoracic aorta. The lungs are clear of an acute process. No worrisome pulmonary lesions or pneumothorax. Moderate to large hiatal hernia again noted. The bony thorax is intact. IMPRESSION: 1. No acute cardiopulmonary findings. 2. Moderate to large hiatal hernia. Electronically Signed   By: Marijo Sanes M.D.   On: 11/09/2019 07:30   (Echo, Carotid, EGD, Colonoscopy, ERCP)    Subjective: No new complaints.  Discharge Exam: Vitals:   11/10/19 0600 11/10/19 0700  BP: (!) 147/63 (!) 127/54  Pulse: 62 (!) 50  Resp: 18 19  Temp:    SpO2: 92% 95%   Vitals:   11/10/19 0400 11/10/19 0500 11/10/19 0600 11/10/19 0700  BP: (!) 117/47 (!) 117/46 (!) 147/63 (!) 127/54  Pulse: (!) 53 (!) 54 62 (!) 50  Resp: 18 (!) 21 18 19   Temp:      TempSrc:      SpO2: 93% 96% 92% 95%  Weight:      Height:        General: Pt is alert, awake, not in acute distress Cardiovascular: RRR, S1/S2 +, no rubs, no gallops Respiratory: CTA bilaterally, no wheezing, no rhonchi Abdominal: Soft, NT, ND, bowel sounds + Extremities: no edema, no cyanosis    The results of significant diagnostics from this hospitalization (including imaging, microbiology, ancillary and laboratory) are listed below for reference.     Microbiology: Recent Results (from the past 240 hour(s))  Respiratory Panel by RT PCR (Flu A&B, Covid) - Nasopharyngeal Swab     Status: None   Collection Time: 11/09/19  2:38 PM   Specimen: Nasopharyngeal Swab  Result Value Ref Range Status   SARS Coronavirus 2 by RT PCR NEGATIVE NEGATIVE Final    Comment: (NOTE) SARS-CoV-2 target nucleic acids are NOT DETECTED.  The SARS-CoV-2 RNA is generally detectable in upper respiratoy specimens during the acute phase of infection. The lowest concentration of SARS-CoV-2 viral copies this assay can detect is 131 copies/mL. A negative result does not preclude SARS-Cov-2 infection and should not be used as the sole basis for treatment or other patient  management decisions. A negative result may occur with  improper specimen collection/handling, submission of specimen other than nasopharyngeal swab, presence of viral mutation(s) within the areas targeted by this assay, and inadequate number of viral copies (<131 copies/mL). A negative result must be combined with clinical observations, patient history, and epidemiological information. The expected result is Negative.  Fact Sheet for Patients:  PinkCheek.be  Fact Sheet for Healthcare Providers:  GravelBags.it  This test is no t yet approved or cleared by the [place] FDA and  has been authorized for detection and/or diagnosis of SARS-CoV-2 by FDA under an Emergency Use Authorization (EUA). This EUA will remain  in effect (meaning this test can be used) for the duration of the COVID-19 declaration under Section 564(b)(1) of the Act, 21 U.S.C. section 360bbb-3(b)(1), unless the authorization is terminated or revoked sooner.     Influenza A by PCR NEGATIVE NEGATIVE Final   Influenza B by PCR NEGATIVE NEGATIVE Final    Comment: (NOTE) The Xpert Xpress SARS-CoV-2/FLU/RSV assay is intended as an aid in  the diagnosis  of influenza from Nasopharyngeal swab specimens and  should not be used as a sole basis for treatment. Nasal washings and  aspirates are unacceptable for Xpert Xpress SARS-CoV-2/FLU/RSV  testing.  Fact Sheet for Patients: PinkCheek.be  Fact Sheet for Healthcare Providers: GravelBags.it  This test is not yet approved or cleared by the [place] FDA and  has been authorized for detection and/or diagnosis of SARS-CoV-2 by  FDA under an Emergency Use Authorization (EUA). This EUA will remain  in effect (meaning this test can be used) for the duration of the  Covid-19 declaration under Section 564(b)(1) of the Act, 21  U.S.C. section 360bbb-3(b)(1),  unless the authorization is  terminated or revoked. Performed at [facility], [place] [address] [place], [place] [address]      Labs: BNP (last 3 results) No results for input(s): BNP in the last 8760 hours. Basic Metabolic Panel: Recent Labs  Lab 11/09/19 0656  NA 141  K 3.5  CL 105  CO2 25  GLUCOSE 101*  BUN 14  CREATININE 0.99  CALCIUM 8.0*   Liver Function Tests: Recent Labs  Lab 11/09/19 0656  AST 17  ALT 12  ALKPHOS 15*  BILITOT 0.5  PROT 6.1*  ALBUMIN 2.7*   Recent Labs  Lab 11/09/19 0656  LIPASE 24   No results for input(s): AMMONIA in the last 168 hours. CBC: Recent Labs  Lab 11/09/19 0656  WBC 9.5  NEUTROABS 7.6  HGB 10.1*  HCT 32.1*  MCV 94.4  PLT 236   Cardiac Enzymes: No results for input(s): CKTOTAL, CKMB, CKMBINDEX, TROPONINI in the last 168 hours. BNP: Invalid input(s): POCBNP CBG: No results for input(s): GLUCAP in the last 168 hours. D-Dimer No results for input(s): DDIMER in the last 72 hours. Hgb A1c No results for input(s): HGBA1C in the last 72 hours. Lipid Profile No results for input(s): CHOL, HDL, LDLCALC, TRIG, CHOLHDL, LDLDIRECT in the last 72 hours. Thyroid function studies No results for input(s): TSH, T4TOTAL, T3FREE, THYROIDAB in the last 72 hours.  Invalid input(s): FREET3 Anemia work up No results for input(s): VITAMINB12, FOLATE, FERRITIN, TIBC, IRON, RETICCTPCT in the last 72 hours. Urinalysis    Component Value Date/Time   COLORURINE YELLOW 08/30/2017 [place] 08/30/2017 1053   LABSPEC 1.010 08/30/2017 1053   PHURINE 6.5 08/30/2017 1053   GLUCOSEU NEGATIVE 08/30/2017 1053   HGBUR NEGATIVE 08/30/2017 1053   HGBUR trace-intact 01/19/2009 0929   BILIRUBINUR NEGATIVE 08/30/2017 1053   BILIRUBINUR negative 02/01/2017 [place] 08/30/2017 1053   PROTEINUR NEGATIVE 06/29/2017 1621   UROBILINOGEN 0.2 08/30/2017 1053   NITRITE NEGATIVE 08/30/2017 1053   LEUKOCYTESUR  NEGATIVE 08/30/2017 1053   Sepsis Labs Invalid input(s): PROCALCITONIN,  WBC,  LACTICIDVEN Microbiology Recent Results (from the past 240 hour(s))  Respiratory Panel by RT PCR (Flu A&B, Covid) - Nasopharyngeal Swab     Status: None   Collection Time: 11/09/19  2:38 PM   Specimen: Nasopharyngeal Swab  Result Value Ref Range Status   SARS Coronavirus 2 by RT PCR NEGATIVE NEGATIVE Final    Comment: (NOTE) SARS-CoV-2 target nucleic acids are NOT DETECTED.  The SARS-CoV-2 RNA is generally detectable in upper respiratoy specimens during the acute phase of infection. The lowest concentration of SARS-CoV-2 viral copies this assay can detect is 131 copies/mL. A negative result does not preclude SARS-Cov-2 infection and should not be used as the sole basis for treatment or other patient management decisions. A negative  result may occur with  improper specimen collection/handling, submission of specimen other than nasopharyngeal swab, presence of viral mutation(s) within the areas targeted by this assay, and inadequate number of viral copies (<131 copies/mL). A negative result must be combined with clinical observations, patient history, and epidemiological information. The expected result is Negative.  Fact Sheet for Patients:  PinkCheek.be  Fact Sheet for Healthcare Providers:  GravelBags.it  This test is no t yet approved or cleared by the [place] FDA and  has been authorized for detection and/or diagnosis of SARS-CoV-2 by FDA under an Emergency Use Authorization (EUA). This EUA will remain  in effect (meaning this test can be used) for the duration of the COVID-19 declaration under Section 564(b)(1) of the Act, 21 U.S.C. section 360bbb-3(b)(1), unless the authorization is terminated or revoked sooner.     Influenza A by PCR NEGATIVE NEGATIVE Final   Influenza B by PCR NEGATIVE NEGATIVE Final    Comment: (NOTE) The  Xpert Xpress SARS-CoV-2/FLU/RSV assay is intended as an aid in  the diagnosis of influenza from Nasopharyngeal swab specimens and  should not be used as a sole basis for treatment. Nasal washings and  aspirates are unacceptable for Xpert Xpress SARS-CoV-2/FLU/RSV  testing.  Fact Sheet for Patients: PinkCheek.be  Fact Sheet for Healthcare Providers: GravelBags.it  This test is not yet approved or cleared by the [place] FDA and  has been authorized for detection and/or diagnosis of SARS-CoV-2 by  FDA under an Emergency Use Authorization (EUA). This EUA will remain  in effect (meaning this test can be used) for the duration of the  Covid-19 declaration under Section 564(b)(1) of the Act, 21  U.S.C. section 360bbb-3(b)(1), unless the authorization is  terminated or revoked. Performed at [facility], [place] [address] [place], [place] [address]      Time coordinating discharge: Over 30 minutes  SIGNED:   Charlynne Cousins, MD  [facility] 11/10/2019, 7:43 AM Pager   If 7PM-7AM, please contact night-coverage [email or web address] Password [email or web address]

## 2019-11-10 NOTE — Telephone Encounter (Signed)
Spoke with the pts daughter [name] [name] and informed her that we did review her discharge note from where she went to the ER today.  Pt was originally scheduled to see Dr. [name] this morning, but had to cancel due to ER visit.  Per discharge note, pt is to follow-up with Cardiology and have an outpatient stress test done.  Scheduled the pt to come into the office tomorrow to see Dr. [name].  Pt scheduled for tomorrow 11/10 at 2:00 pm.  Daughter and pt both aware to arrive 15 mins prior to this appt.  Informed the pts daughter that we will order the stress test at [place], after Dr. [name] see's her and advises on which stress test she feel's is most appropriate for the pt.  Daughter verbalized understanding and agrees with this plan. Daughter was more than gracious for all the assistance provided.

## 2019-11-10 NOTE — ED Notes (Signed)
Lunch Tray Ordered @ 1033. 

## 2019-11-10 NOTE — ED Notes (Addendum)
Spoke with daughter, informed her of pts DC. Daughter in route to pick up patient.

## 2019-11-10 NOTE — ED Notes (Signed)
Pt sitting up in bed eating breakfast. Denies any complaints.

## 2019-11-11 ENCOUNTER — Ambulatory Visit: Payer: Medicare Other | Admitting: Cardiology

## 2019-11-11 ENCOUNTER — Other Ambulatory Visit: Payer: Self-pay

## 2019-11-11 ENCOUNTER — Encounter: Payer: Self-pay | Admitting: Cardiology

## 2019-11-11 VITALS — BP 152/66 | HR 62 | Ht 59.0 in | Wt 91.0 lb

## 2019-11-11 DIAGNOSIS — I10 Essential (primary) hypertension: Secondary | ICD-10-CM

## 2019-11-11 DIAGNOSIS — R079 Chest pain, unspecified: Secondary | ICD-10-CM | POA: Diagnosis not present

## 2019-11-11 DIAGNOSIS — I4892 Unspecified atrial flutter: Secondary | ICD-10-CM

## 2019-11-11 DIAGNOSIS — Z7901 Long term (current) use of anticoagulants: Secondary | ICD-10-CM

## 2019-11-11 MED ORDER — ISOSORBIDE MONONITRATE ER 30 MG PO TB24: 30.0000 mg | ORAL_TABLET | Freq: Every day | ORAL | 1 refills | Status: DC

## 2019-11-11 NOTE — Progress Notes (Signed)
Cardiology Office Note    Date:  11/11/2019   ID:  [name] [name] [birth year], MRN [id number]  PCP:  Mosie Lukes, MD  Cardiologist:  Ena Dawley, MD  Electrophysiologist:  None   Chief Complaint: Post ER visit follow-up  History of Present Illness:   [name] [name] is a 86 y.o. female with history of persistent atrial fibrillation, prior atrial flutter as well, essential hypertension, sinus bradycardia, hyperlipidemia, anemia, anxiety, arthritis, diverticulosis, GERD, thyroid disease and low body weight who presents for 6 month f/u. She has been felt to have had chronic atrial fib however EKGs in 2018 and 2019 showed sinus bradycardia. Last echo in 2018 showed EF 55-60%, mild MR, moderate LAE, mild TR, normal PASP. She was seen in the ER 06/20/17 with discomfort in chest, upper abdomen and back. CT angio chest/abd/pelvis showed no acute dissection/aneurysm, + atherosclerotic vascular disease with suspected renal artery stenosis, possible 16 mmhyperenhancing splenic mass, sigmoid diverticular disease -> OP w/u of splenic mass recommended. She was then re-hospitalized with weakness and decreased appetite found to be hyponatremic and slightly bradycardic.  She was treated for possible SIADH. Initially the patient was bradycardic and her beta-blocker was held but heart rate increased to greater than 100. She was seen in consultation by Dr. Marlou Porch who felt that her rhythm was actually atrial flutter appearing. He advised continued rate control on low-dose Toprol and watch for bradycardia. At last f/u 02/2018 she was felt to be doing well overall. Coumadin was switched to Eliquis during begininng of Covid pandemic. Last labs in 02/2018 showed normal TSH, K 4.2, Cr 0.96, normal LFTs, A1C 5.6, Hgb 11.4.  02/09/2019 - the patient is coming after 6 months, she underwent event monitoring in 09/2018 with findings of persistent atrial flutter with variable block with minimum HR 45, maximum HR 136 and  average HR 65 BPM. 1 episode of ventricular tachycardia lasting 8 beats.We continued the same management medical management.  She states that her major problem was is esophageal stricture and diverticulum with fluid retention for which she had to undergo upper endoscopy.  Because of her age they were not able to perform surgery and she is now on pured food only.  She has lost significant amount of weight.  She denies any chest pain shortness of breath dizziness or syncope.  She has sometimes episodes of light orthostatic hypotension when she gets up she has never fallen.  She had one episode of fall when she tripped in her bathroom.  She did not hit anything.  She denies any bleeding.  11/11/2019 -the patient is coming after her recent visit to the ER last Friday, she has developed sharp retrosternal chest pain, it has resolved with sublingual nitroglycerin in the ER.  Her blood pressure was elevated up to 170 in the ER.  This is first time she was experiencing this type of pain, and it has been at rest.  She remains active she still able to cook walk around her house.  She does not experience any worsening dyspnea on exertion, she is no palpitation no dizziness no recent falls no lower extremity edema.  She is currently asymptomatic.  In the ER her troponins were negative and her EKG was normal and unchanged from prior.  Past Medical History:  Diagnosis Date  . Anemia   . Anxiety   . Arthritis    hips, knees  . Current use of long term anticoagulation   . Diverticulitis of colon 02/06/2015  .  Gait difficulty   . GERD (gastroesophageal reflux disease)   . Headache(784.0) 06/22/2012  . Hyperlipidemia   . Hypertension   . IBS (irritable bowel syndrome)   . Incontinence   . Loss of weight 06/05/2015  . Medicare annual wellness visit, subsequent 02/05/2014  . Melena   . Mild mitral regurgitation   . Mild tricuspid regurgitation   . Mitral and aortic regurgitation   . Mitral valve prolapse    hx of -  not seen on recent echoes  . Osteopenia   . Paroxysmal atrial flutter ([place])   . Pedal edema 10/01/2016  . Persistent atrial fibrillation ([place])   . Personal history of colonic polyps   . Renal lithiasis 06/26/2015  . Sinus bradycardia   . Thyroid disease    hypo  . Vitamin B12 deficiency 03/16/2016  . Vitamin D deficiency     Past Surgical History:  Procedure Laterality Date  . BOTOX INJECTION N/A 09/03/2012   Procedure: BOTOX INJECTION;  Surgeon: Inda Castle, MD;  Location: [facility];  Service: Endoscopy;  Laterality: N/A;  . BREAST BIOPSY Left   . BREAST LUMPECTOMY Right 08/18/2014   Procedure: RIGHT BREAST LUMPECTOMY;  Surgeon: Coralie Keens, MD;  Location: [facility];  Service: General;  Laterality: Right;  . CARDIAC ELECTROPHYSIOLOGY MAPPING AND ABLATION    . CATARACT EXTRACTION, BILATERAL    . ESOPHAGOGASTRODUODENOSCOPY N/A 09/03/2012   Procedure: ESOPHAGOGASTRODUODENOSCOPY (EGD);  Surgeon: Inda Castle, MD;  Location: [facility];  Service: Endoscopy;  Laterality: N/A;  . ESOPHAGOGASTRODUODENOSCOPY (EGD) WITH PROPOFOL N/A 12/02/2018   Procedure: ESOPHAGOGASTRODUODENOSCOPY (EGD) WITH PROPOFOL;  Surgeon: [name], MD;  Location: [facility];  Service: Gastroenterology;  Laterality: N/A;  . ESOPHAGOSCOPY W/ BOTOX INJECTION    . I & D EXTREMITY Right 11/06/2012   Procedure: IRRIGATION AND DEBRIDEMENT EXTREMITY Right Ring Finger;  Surgeon: Tennis Must, MD;  Location: [facility];  Service: Orthopedics;  Laterality: Right;  . PARTIAL HYSTERECTOMY     ovaries left in place  . skin cancer removal      Current Medications: Current Meds  Medication Sig  . ALPRAZolam (XANAX) 0.25 MG tablet TAKE 0.5-1 TABLETS (0.125-0.25 MG TOTAL) BY MOUTH 2 (TWO) TIMES DAILY AS NEEDED FOR ANXIETY.  . Cholecalciferol (VITAMIN D) 2000 UNITS CAPS Take 2,000 Units by mouth every other day.  Marland Kitchen ELIQUIS 2.5 MG TABS tablet TAKE 1 TABLET BY MOUTH TWICE A DAY  . famotidine (PEPCID)  20 MG tablet Take 40 mg by mouth at bedtime.   . hydrALAZINE (APRESOLINE) 10 MG tablet TAKE 1 TABLET BY MOUTH THREE TIMES A DAY  . levocetirizine (XYZAL) 5 MG tablet TAKE 1/2 TABLET (2.5 MG TOTAL) BY MOUTH EVERY EVENING.  . metoprolol succinate (TOPROL-XL) 50 MG 24 hr tablet Take 0.5 tablets (25 mg total) by mouth daily.  . pantoprazole (PROTONIX) 40 MG tablet TAKE 1 TABLET BY MOUTH EVERY DAY  . ramipril (ALTACE) 10 MG capsule TAKE 1 CAPSULE (10 MG TOTAL) BY MOUTH 2 (TWO) TIMES DAILY.  . vitamin B-12 (CYANOCOBALAMIN) 1000 MCG tablet Take 1,000 mcg by mouth daily.     Allergies:   Amlodipine, Darifenacin hydrobromide, Sulfonamide derivatives, and Tramadol   Social History   Socioeconomic History  . Marital status: Widowed    Spouse name: Not on file  . Number of children: 5  . Years of education: Not on file  . Highest education level: Not on file  Occupational History  . Occupation: retired  Employer: RETIRED  Tobacco Use  . Smoking status: Never Smoker  . Smokeless tobacco: Never Used  Vaping Use  . Vaping Use: Never used  Substance and Sexual Activity  . Alcohol use: No  . Drug use: No  . Sexual activity: Not on file    Comment: lives with Daughter, grandson and his family. no dietary restricitons.  Other Topics Concern  . Not on file  Social History Narrative  . Not on file   Social Determinants of Health   Financial Resource Strain:   . Difficulty of Paying Living Expenses: Not on file  Food Insecurity:   . Worried About Charity fundraiser in the Last Year: Not on file  . Ran Out of Food in the Last Year: Not on file  Transportation Needs:   . Lack of Transportation (Medical): Not on file  . Lack of Transportation (Non-Medical): Not on file  Physical Activity:   . Days of Exercise per Week: Not on file  . Minutes of Exercise per Session: Not on file  Stress:   . Feeling of Stress : Not on file  Social Connections:   . Frequency of Communication with Friends  and Family: Not on file  . Frequency of Social Gatherings with Friends and Family: Not on file  . Attends Religious Services: Not on file  . Active Member of Clubs or Organizations: Not on file  . Attends Archivist Meetings: Not on file  . Marital Status: Not on file     Family History:  The patient's family history includes Allergies in her daughter; COPD in her daughter, daughter, and father; CVA in her mother; Diabetes in her mother; Heart disease in her maternal aunt and maternal uncle; Rheum arthritis in her father; Stroke in her daughter, maternal grandfather, and mother. There is no history of Colon cancer, Colon polyps, Esophageal cancer, Gallbladder disease, Kidney disease, Heart attack, or Hypertension.  ROS:   Please see the history of present illness. + recent rx for UTI. All other systems are reviewed and otherwise negative.    EKGs/Labs/Other Studies Reviewed:    Studies reviewed were summarized above.   EKG:  EKG is ordered today.  The EKG ordered today demonstrates likely atrial flutter 2:1 HR 122bpm with diffuse ST sagging II, III, avF, V3-V6 (ST changes similar to prior) - less likely sinus tach   Recent Labs: 10/29/2019: TSH 3.37 11/09/2019: ALT 12; BUN 14; Creatinine, Ser 0.99; Hemoglobin 10.1; Platelets 236; Potassium 3.5; Sodium 141  Recent Lipid Panel    Component Value Date/Time   CHOL 128 10/29/2019 1358   TRIG 114 10/29/2019 1358   HDL 36 (L) 10/29/2019 1358   CHOLHDL 3.6 10/29/2019 1358   VLDL 19.6 06/12/2019 1033   LDLCALC 72 10/29/2019 1358   LDLDIRECT 72.1 01/19/2009 1026    PHYSICAL EXAM:    VS:  BP (!) 152/66   Pulse 62   Ht 4\' 11"  (1.499 m)   Wt 91 lb (41.3 kg)   SpO2 95%   BMI 18.38 kg/m   BMI: Body mass index is 18.38 kg/m.  GEN: Well developed petite elderly WF, in no acute distress HEENT: normocephalic, atraumatic Neck: no JVD, carotid bruits, or masses Cardiac: Reg rhythm, tachycardic; no murmurs, rubs, or gallops, no  edema  Respiratory:  clear to auscultation bilaterally, normal work of breathing GI: soft, nontender, nondistended, + BS MS:  kyphotic posture Skin: warm and dry, no rash Neuro:  Alert and Oriented x 3, Strength and  sensation are intact, follows commands Psych: euthymic mood, full affect  Wt Readings from Last 3 Encounters:  11/11/19 91 lb (41.3 kg)  11/09/19 92 lb 13 oz (42.1 kg)  10/29/19 92 lb 14.4 oz (42.1 kg)     ASSESSMENT & PLAN:   1. Chest pain, atypical occurring at rest, for the first time however resolved with nitroglycerin, possibly secondary to elevated blood pressure, given patient's age and the fact that these appear for the first time I will avoid stress test or any other work-up unless this is recurrent event.  For now I will add Imdur 30 mg daily to be taken at night, that will control her blood pressure as well as help with coronary vasodilation. 2. Atrial flutter, also with history of persistent AF -she underwent Zio patch monitoring in November 2020 with ventricular rates ranging from 45-1 36, will continue the same management, she has no bleeding with Eliquis and most recent hemoglobin was 11.4.  She has not had any recent palpitations. 3. Essential HTN -as about, we are adding Imdur 30 mg daily.   4. Mild mitral regurgitation - no significant murmur on recent exam or symptoms to suggest progression. Follow clinically.  Disposition: F/u in 3 months.  Medication Adjustments/Labs and Tests Ordered: Current medicines are reviewed at length with the patient today.  Concerns regarding medicines are outlined above. Medication changes, Labs and Tests ordered today are summarized above and listed in the Patient Instructions accessible in Encounters.   Signed, Ena Dawley, MD  11/11/2019 5:10 PM    [facility] [place], [place], [place]  [address] Phone: [phone]; Fax: [phone]

## 2019-11-11 NOTE — Patient Instructions (Signed)
Medication Instructions:   START TAKING ISOSORBIDE MONONITRATE (IMDUR) 30 MG BY MOUTH DAILY  *If you need a refill on your cardiac medications before your next appointment, please call your pharmacy*   Follow-Up:  3 MONTHS WITH AN EXTENDER IN THE OFFICE.

## 2019-12-24 DIAGNOSIS — H9113 Presbycusis, bilateral: Secondary | ICD-10-CM | POA: Diagnosis not present

## 2019-12-24 DIAGNOSIS — H903 Sensorineural hearing loss, bilateral: Secondary | ICD-10-CM | POA: Diagnosis not present

## 2020-01-21 NOTE — Chronic Care Management (AMB) (Deleted)
Chronic Care Management Pharmacy  Name: [name] [name]  MRN: [id number] DOB: 1924/02/18  Chief Complaint/ HPI  [name] [name],  87 y.o. , female presents for their Follow-Up CCM visit with the clinical pharmacist via telephone due to COVID-19 Pandemic.  PCP : Mosie Lukes, MD  Their chronic conditions include: Afib, Hypertension, GERD, Allergic Rhinitis  Office Visits: 06/11/19: Visit w/ Dr. Charlett Blake -  Med changed to hydralazine BID vs TID to reflect how patient taking. Azelastine D/C.  Consult Visit: 11/11/19 (Cardiology, [name]) - did not end up doing stress test, added Imdur 30mg  nightly, no other medication changes   11/09/2019 (ED) - L chest pain, patient admitted and advised to follow up with cardiology after hospital for stress test.  02/09/19: Cardio visit w/ Dr. [name] - No med changes noted.  Medications: Outpatient Encounter Medications as of 01/27/2020  Medication Sig  . ALPRAZolam (XANAX) 0.25 MG tablet TAKE 0.5-1 TABLETS (0.125-0.25 MG TOTAL) BY MOUTH 2 (TWO) TIMES DAILY AS NEEDED FOR ANXIETY.  . Cholecalciferol (VITAMIN D) 2000 UNITS CAPS Take 2,000 Units by mouth every other day.  Marland Kitchen ELIQUIS 2.5 MG TABS tablet TAKE 1 TABLET BY MOUTH TWICE A DAY  . famotidine (PEPCID) 20 MG tablet Take 40 mg by mouth at bedtime.   . hydrALAZINE (APRESOLINE) 10 MG tablet TAKE 1 TABLET BY MOUTH THREE TIMES A DAY  . isosorbide mononitrate (IMDUR) 30 MG 24 hr tablet Take 1 tablet (30 mg total) by mouth daily.  Marland Kitchen levocetirizine (XYZAL) 5 MG tablet TAKE 1/2 TABLET (2.5 MG TOTAL) BY MOUTH EVERY EVENING.  . metoprolol succinate (TOPROL-XL) 50 MG 24 hr tablet Take 0.5 tablets (25 mg total) by mouth daily.  . pantoprazole (PROTONIX) 40 MG tablet TAKE 1 TABLET BY MOUTH EVERY DAY  . ramipril (ALTACE) 10 MG capsule TAKE 1 CAPSULE (10 MG TOTAL) BY MOUTH 2 (TWO) TIMES DAILY.  . vitamin B-12 (CYANOCOBALAMIN) 1000 MCG tablet Take 1,000 mcg by mouth daily.   No facility-administered encounter  medications on file as of 01/27/2020.   SDOH Screenings   Alcohol Screen: Not on file  Depression (PHQ2-9): Low Risk   . PHQ-2 Score: 0  Financial Resource Strain: Not on file  Food Insecurity: Not on file  Housing: Not on file  Physical Activity: Not on file  Social Connections: Not on file  Stress: Not on file  Tobacco Use: Low Risk   . Smoking Tobacco Use: Never Smoker  . Smokeless Tobacco Use: Never Used  Transportation Needs: Not on file     Current Diagnosis/Assessment:  Goals Addressed   None   Social Hx:  Lives with 2 daughters.  Moved to a 3 bedroom.   AFIB   Patient is currently rate controlled.  Patient has failed these meds in past: None noted  Patient is currently controlled on the following medications:   Eliquis 2.5mg  twice AM and HS  Metoprolol succinate 50mg  1/2 tab twice daily  Plan -Continue current medications   Hypertension   BP goal is:  <140/90  Office blood pressures are  BP Readings from Last 3 Encounters:  11/11/19 (!) 152/66  11/10/19 (!) 143/55  10/29/19 138/78   Patient checks BP at home infrequently Patient home BP readings are ranging: "140 something over 60 something"  Patient has failed these meds in the past: amlodipine (SOB), hctz (hypokalemia?) Patient is currently controlled on the following medications:  . Ramipril 10mg  twice daily AM and HS . Hydralazine 10mg  twice daily  Denies headaches or chest pain  Update 01/27/20 Chest pain?? Plan -Requested patient to check BP 2-3 times per week and record -Continue current medications   GERD    Patient has failed these meds in past: esomeprazole (listed in D/C meds. No apparent reason for D/C) Patient is currently controlled on the following medications:   Pantoprazole 40mg  daily  Famotidine 20mg  daily  Doesn't eat much solid food.   Plan -Continue current medications  Allergic Rhinitis    Patient has failed these meds in past: None noted  Patient is  currently controlled on the following medications:   Levocetirizine 5mg  1/2 tab daily  No allergy symptoms  Plan -Continue current medications   Vaccines   Reviewed and discussed patient's vaccination history.    Immunization History  Administered Date(s) Administered  . Fluad Quad(high Dose 65+) 09/06/2018, 10/30/2019  . Influenza Whole 09/18/2007, 10/02/2010, 09/15/2012  . Influenza, High Dose Seasonal PF 10/05/2015, 10/01/2016, 10/18/2017  . Influenza,inj,Quad PF,6+ Mos 08/24/2013, 08/31/2014  . PFIZER(Purple Top)SARS-COV-2 Vaccination 02/11/2019, 03/04/2019  . Pneumococcal Conjugate-13 08/31/2014  . Pneumococcal Polysaccharide-23 02/20/2006, 06/11/2019  . Tdap 10/30/2010    Plan  Recommended patient receive Shingrix vaccine in pharmacy.   Miscellaneous Meds Vitamin B-12 584mcg daily Vitamin D 2000 units daily Alprazolam 0.25mg  as needed   Beverly Milch, PharmD Clinical Pharmacist [facility] [phone]

## 2020-01-27 ENCOUNTER — Telehealth: Payer: Medicare Other

## 2020-01-29 ENCOUNTER — Telehealth: Payer: Self-pay | Admitting: Pharmacist

## 2020-01-29 NOTE — Progress Notes (Addendum)
    Chronic Care Management Pharmacy Assistant   Name: [name] [name]  MRN: [id number] DOB: 02/16/1924  Reason for Encounter: Adherence Review  Verified Adherence Gap Information. Per insurance data, the patient is 100% compliant with the HTN medication: Ramipril 10 mg. Patient has met their wellness bundle screening but the patient has not met their annual wellness screening. Their most recent A1C was 5.6 on 10/29/19. Their recent blood pressure was 138/78 on 10/29/19. Their total gaps measures is equal to 1.  Follow-Up:  Pharmacist Review   Charlann Lange, Joy Pharmacist Assistant [phone]  2 minutes spent in review, coordination, and documentation.  Reviewed by: Beverly Milch, PharmD Clinical Pharmacist [facility] [phone]

## 2020-02-09 NOTE — Progress Notes (Unsigned)
Cardiology Office Note    Date:  02/10/2020   ID:  [name] [name] 1924-06-26, MRN [id number]  PCP:  Mosie Lukes, MD  Cardiologist: Ena Dawley, MD EPS: None  Chief Complaint  Patient presents with  . Follow-up    History of Present Illness:  [name] [name] is a 87 y.o. female with history of persistent atrial fibrillation, prior atrial flutter as well, essential hypertension, sinus bradycardia, hyperlipidemia, anemia, anxiety, arthritis, diverticulosis, GERD, thyroid disease and low body weight   She was then re-hospitalized with weakness and decreased appetite found to be hyponatremic and slightly bradycardic. She was treated for possible SIADH. Initially the patient was bradycardic and her beta-blocker was held but heart rate increased to greater than 100. She was seen in consultation by Dr. Marlou Porch who felt that her rhythm was actually atrial flutter appearing. He advised continued rate control on low-dose Toprol and watch for bradycardia  with history of persistent atrial fibrillation, prior atrial flutter as well, essential hypertension, sinus bradycardia, hyperlipidemia, anemia, anxiety, arthritis, diverticulosis, GERD, thyroid disease and low body weight. Coumadin was switched to Eliquis during begininng of Covid pandemic   Event monitoring in 09/2018 with findings of persistent atrial flutter with variable block with minimum HR 45, maximum HR 136 and average HR 65 BPM. 1 episode of ventricular tachycardia lasting 8 beats.We continued the same management medical management.   Last saw Dr. [name] 11/11/19 after ER visit for atypical chest pain resolved with NTG and elevated BP. Given age no workup recommended but Imdur 30 mg daily started.   Patient comes in for f/u. Denies any further chest pain, dyspnea, or edema. Occasional palpitation-sometimes races at night and skips but doesn't last long.  Past Medical History:  Diagnosis Date  . Anemia   . Anxiety   .  Arthritis    hips, knees  . Current use of long term anticoagulation   . Diverticulitis of colon 02/06/2015  . Gait difficulty   . GERD (gastroesophageal reflux disease)   . Headache(784.0) 06/22/2012  . Hyperlipidemia   . Hypertension   . IBS (irritable bowel syndrome)   . Incontinence   . Loss of weight 06/05/2015  . Medicare annual wellness visit, subsequent 02/05/2014  . Melena   . Mild mitral regurgitation   . Mild tricuspid regurgitation   . Mitral and aortic regurgitation   . Mitral valve prolapse    hx of - not seen on recent echoes  . Osteopenia   . Paroxysmal atrial flutter ([place])   . Pedal edema 10/01/2016  . Persistent atrial fibrillation ([place])   . Personal history of colonic polyps   . Renal lithiasis 06/26/2015  . Sinus bradycardia   . Thyroid disease    hypo  . Vitamin B12 deficiency 03/16/2016  . Vitamin D deficiency     Past Surgical History:  Procedure Laterality Date  . BOTOX INJECTION N/A 09/03/2012   Procedure: BOTOX INJECTION;  Surgeon: Inda Castle, MD;  Location: [facility];  Service: Endoscopy;  Laterality: N/A;  . BREAST BIOPSY Left   . BREAST LUMPECTOMY Right 08/18/2014   Procedure: RIGHT BREAST LUMPECTOMY;  Surgeon: Coralie Keens, MD;  Location: [facility];  Service: General;  Laterality: Right;  . CARDIAC ELECTROPHYSIOLOGY MAPPING AND ABLATION    . CATARACT EXTRACTION, BILATERAL    . ESOPHAGOGASTRODUODENOSCOPY N/A 09/03/2012   Procedure: ESOPHAGOGASTRODUODENOSCOPY (EGD);  Surgeon: Inda Castle, MD;  Location: [facility];  Service: Endoscopy;  Laterality: N/A;  .  ESOPHAGOGASTRODUODENOSCOPY (EGD) WITH PROPOFOL N/A 12/02/2018   Procedure: ESOPHAGOGASTRODUODENOSCOPY (EGD) WITH PROPOFOL;  Surgeon: [name], MD;  Location: [facility];  Service: Gastroenterology;  Laterality: N/A;  . ESOPHAGOSCOPY W/ BOTOX INJECTION    . I & D EXTREMITY Right 11/06/2012   Procedure: IRRIGATION AND DEBRIDEMENT EXTREMITY Right Ring Finger;   Surgeon: Tennis Must, MD;  Location: [facility];  Service: Orthopedics;  Laterality: Right;  . PARTIAL HYSTERECTOMY     ovaries left in place  . skin cancer removal      Current Medications: Current Meds  Medication Sig  . ALPRAZolam (XANAX) 0.25 MG tablet TAKE 0.5-1 TABLETS (0.125-0.25 MG TOTAL) BY MOUTH 2 (TWO) TIMES DAILY AS NEEDED FOR ANXIETY.  . Cholecalciferol (VITAMIN D) 2000 UNITS CAPS Take 2,000 Units by mouth every other day.  Marland Kitchen ELIQUIS 2.5 MG TABS tablet TAKE 1 TABLET BY MOUTH TWICE A DAY  . famotidine (PEPCID) 20 MG tablet Take 40 mg by mouth at bedtime.   . hydrALAZINE (APRESOLINE) 10 MG tablet TAKE 1 TABLET BY MOUTH THREE TIMES A DAY  . isosorbide mononitrate (IMDUR) 30 MG 24 hr tablet Take 1 tablet (30 mg total) by mouth daily.  Marland Kitchen levocetirizine (XYZAL) 5 MG tablet TAKE 1/2 TABLET (2.5 MG TOTAL) BY MOUTH EVERY EVENING.  . metoprolol succinate (TOPROL-XL) 50 MG 24 hr tablet Take 0.5 tablets (25 mg total) by mouth daily.  . pantoprazole (PROTONIX) 40 MG tablet TAKE 1 TABLET BY MOUTH EVERY DAY  . ramipril (ALTACE) 10 MG capsule TAKE 1 CAPSULE (10 MG TOTAL) BY MOUTH 2 (TWO) TIMES DAILY.  . vitamin B-12 (CYANOCOBALAMIN) 1000 MCG tablet Take 1,000 mcg by mouth daily.     Allergies:   Amlodipine, Darifenacin hydrobromide, Other, Sulfonamide derivatives, and Tramadol   Social History   Socioeconomic History  . Marital status: Widowed    Spouse name: Not on file  . Number of children: 5  . Years of education: Not on file  . Highest education level: Not on file  Occupational History  . Occupation: retired    Fish farm manager: RETIRED  Tobacco Use  . Smoking status: Never Smoker  . Smokeless tobacco: Never Used  Vaping Use  . Vaping Use: Never used  Substance and Sexual Activity  . Alcohol use: No  . Drug use: No  . Sexual activity: Not on file    Comment: lives with Daughter, grandson and his family. no dietary restricitons.  Other Topics Concern  . Not on file  Social  History Narrative  . Not on file   Social Determinants of Health   Financial Resource Strain: Not on file  Food Insecurity: Not on file  Transportation Needs: Not on file  Physical Activity: Not on file  Stress: Not on file  Social Connections: Not on file     Family History:  The patient's family history includes Allergies in her daughter; COPD in her daughter, daughter, and father; CVA in her mother; Diabetes in her mother; Heart disease in her maternal aunt and maternal uncle; Rheum arthritis in her father; Stroke in her daughter, maternal grandfather, and mother.   ROS:   Please see the history of present illness.    ROS All other systems reviewed and are negative.   PHYSICAL EXAM:   VS:  BP 120/60   Pulse 74   Ht 4\' 11"  (1.499 m)   Wt 93 lb 9.6 oz (42.5 kg)   SpO2 96%   BMI 18.90 kg/m   Physical Exam  GEN: Thin, elderly, in no acute distress  Neck: no JVD, carotid bruits, or masses DUKGURK:YHC;6/2 systolic murmur  Respiratory:  clear to auscultation bilaterally, normal work of breathing GI: soft, nontender, nondistended, + BS Ext: without cyanosis, clubbing, or edema, Good distal pulses bilaterally Neuro:  Alert and Oriented x 3 Psych: euthymic mood, full affect  Wt Readings from Last 3 Encounters:  02/10/20 93 lb 9.6 oz (42.5 kg)  11/11/19 91 lb (41.3 kg)  11/09/19 92 lb 13 oz (42.1 kg)      Studies/Labs Reviewed:   EKG:  EKG is not ordered today.    Recent Labs: 10/29/2019: TSH 3.37 11/09/2019: ALT 12; BUN 14; Creatinine, Ser 0.99; Hemoglobin 10.1; Platelets 236; Potassium 3.5; Sodium 141   Lipid Panel    Component Value Date/Time   CHOL 128 10/29/2019 1358   TRIG 114 10/29/2019 1358   HDL 36 (L) 10/29/2019 1358   CHOLHDL 3.6 10/29/2019 1358   VLDL 19.6 06/12/2019 1033   LDLCALC 72 10/29/2019 1358   LDLDIRECT 72.1 01/19/2009 1026    Additional studies/ records that were reviewed today include:  Monitor 09/2018 Study Highlights     Persistent  atrial flutter with variable block with minimum HR 45, maximum HR 136 and average HR 65 BPM.  1 episode of ventricular tachycardia lasting 8 beats.   Persistent atrial flutter with variable block with minimum HR 45, maximum HR 136 and average HR 65 BPM. Continue current medical management.     Echo 2018 Study Conclusions   - Left ventricle: The cavity size was normal. There was mild    concentric hypertrophy. Systolic function was normal. The    estimated ejection fraction was in the range of 55% to 60%. Wall    motion was normal; there were no regional wall motion    abnormalities. The study is not technically sufficient to allow    evaluation of LV diastolic function.  - Aortic valve: Transvalvular velocity was within the normal range.    There was no stenosis. There was no regurgitation.  - Mitral valve: Transvalvular velocity was within the normal range.    There was no evidence for stenosis. There was mild regurgitation.  - Left atrium: The atrium was moderately dilated.  - Right ventricle: Systolic function was normal.  - Atrial septum: No defect or patent foramen ovale was identified.  - Tricuspid valve: There was mild regurgitation.  - Pulmonary arteries: Systolic pressure was within the normal    range. PA peak pressure: 36 mm Hg (S).    Risk Assessment/Calculations:    CHA2DS2-VASc Score = 5  This indicates a 7.2% annual risk of stroke. The patient's score is based upon: CHF History: Yes HTN History: Yes Diabetes History: No Stroke History: No Vascular Disease History: No Age Score: 2 Gender Score: 1        ASSESSMENT:    1. Persistent atrial fibrillation (HCC)   2. Paroxysmal atrial flutter ([place])   3. Essential hypertension   4. Chest pain, unspecified type   5. Hyperlipidemia, unspecified hyperlipidemia type   6. MITRAL REGURGITATION, MILD      PLAN:  In order of problems listed above:  Persistent Afib/aflutter on Eliquis with some bradycardia  on low dose toprol xl 25 mg daily. Will check labs today. No bleeding problems on eliquis  HTN BP controlled  History of chest pain-started on Imdur-no further chest pain  HLD LDL 72 10/2019 will not repeat today  Mild MR-asymptomatic   Shared Decision Making/Informed  Consent        Medication Adjustments/Labs and Tests Ordered: Current medicines are reviewed at length with the patient today.  Concerns regarding medicines are outlined above.  Medication changes, Labs and Tests ordered today are listed in the Patient Instructions below. Patient Instructions  Medication Instructions:  Your physician recommends that you continue on your current medications as directed. Please refer to the Current Medication list given to you today.  *If you need a refill on your cardiac medications before your next appointment, please call your pharmacy*   Lab Work: CBC, CMET Today  If you have labs (blood work) drawn today and your tests are completely normal, you will receive your results only by: Marland Kitchen MyChart Message (if you have MyChart) OR . A paper copy in the mail If you have any lab test that is abnormal or we need to change your treatment, we will call you to review the results.   Follow-Up: At [facility], you and your health needs are our priority.  As part of our continuing mission to provide you with exceptional heart care, we have created designated Provider Care Teams.  These Care Teams include your primary Cardiologist (physician) and Advanced Practice Providers (APPs -  Physician Assistants and Nurse Practitioners) who all work together to provide you with the care you need, when you need it.  We recommend signing up for the patient portal called "MyChart".  Sign up information is provided on this After Visit Summary.  MyChart is used to connect with patients for Virtual Visits (Telemedicine).  Patients are able to view lab/test results, encounter notes, upcoming appointments, etc.   Non-urgent messages can be sent to your provider as well.   To learn more about what you can do with MyChart, go to [email or web address].    Your next appointment:   6 month(s)  The format for your next appointment:   In Person  Provider:   Gwyndolyn Kaufman, MD      Signed, [name], PA-C  02/10/2020 1:40 PM    [address], [place], [place]  [address] Phone: [phone]; Fax: [phone]

## 2020-02-10 ENCOUNTER — Other Ambulatory Visit: Payer: Self-pay

## 2020-02-10 ENCOUNTER — Ambulatory Visit: Payer: Medicare Other | Admitting: Physician Assistant

## 2020-02-10 ENCOUNTER — Telehealth: Payer: Self-pay | Admitting: Cardiology

## 2020-02-10 ENCOUNTER — Encounter: Payer: Self-pay | Admitting: Physician Assistant

## 2020-02-10 VITALS — BP 120/60 | HR 74 | Ht 59.0 in | Wt 93.6 lb

## 2020-02-10 DIAGNOSIS — I4892 Unspecified atrial flutter: Secondary | ICD-10-CM | POA: Diagnosis not present

## 2020-02-10 DIAGNOSIS — I080 Rheumatic disorders of both mitral and aortic valves: Secondary | ICD-10-CM | POA: Diagnosis not present

## 2020-02-10 DIAGNOSIS — R079 Chest pain, unspecified: Secondary | ICD-10-CM | POA: Diagnosis not present

## 2020-02-10 DIAGNOSIS — E785 Hyperlipidemia, unspecified: Secondary | ICD-10-CM

## 2020-02-10 DIAGNOSIS — I4819 Other persistent atrial fibrillation: Secondary | ICD-10-CM | POA: Diagnosis not present

## 2020-02-10 DIAGNOSIS — I10 Essential (primary) hypertension: Secondary | ICD-10-CM | POA: Diagnosis not present

## 2020-02-10 MED ORDER — METOPROLOL SUCCINATE ER 25 MG PO TB24: 25.0000 mg | ORAL_TABLET | Freq: Every day | ORAL | 1 refills | Status: DC

## 2020-02-10 NOTE — Patient Instructions (Signed)
Medication Instructions:  Your physician recommends that you continue on your current medications as directed. Please refer to the Current Medication list given to you today.  *If you need a refill on your cardiac medications before your next appointment, please call your pharmacy*   Lab Work: CBC, CMET Today  If you have labs (blood work) drawn today and your tests are completely normal, you will receive your results only by: Marland Kitchen MyChart Message (if you have MyChart) OR . A paper copy in the mail If you have any lab test that is abnormal or we need to change your treatment, we will call you to review the results.   Follow-Up: At [facility], you and your health needs are our priority.  As part of our continuing mission to provide you with exceptional heart care, we have created designated Provider Care Teams.  These Care Teams include your primary Cardiologist (physician) and Advanced Practice Providers (APPs -  Physician Assistants and Nurse Practitioners) who all work together to provide you with the care you need, when you need it.  We recommend signing up for the patient portal called "MyChart".  Sign up information is provided on this After Visit Summary.  MyChart is used to connect with patients for Virtual Visits (Telemedicine).  Patients are able to view lab/test results, encounter notes, upcoming appointments, etc.  Non-urgent messages can be sent to your provider as well.   To learn more about what you can do with MyChart, go to [email or web address].    Your next appointment:   6 month(s)  The format for your next appointment:   In Person  Provider:   Gwyndolyn Kaufman, MD

## 2020-02-10 NOTE — Telephone Encounter (Signed)
Called Izora Gala (on [place]) and informed her that I will send the Toprol XL 25 mg po daily to the pts confirmed pharmacy of choice, with a note to the pharmacy that pt will call for further refills, and will use current supply on hand at this time.  Izora Gala verbalized understanding and agrees with this plan. Izora Gala was more than gracious for all the assistance provided.

## 2020-02-10 NOTE — Telephone Encounter (Signed)
Pt c/o medication issue:  1. Name of Medication: metoprolol succinate (TOPROL-XL) 50 MG 24 hr tablet  2. How are you currently taking this medication (dosage and times per day)? Half a tablet daily  3. Are you having a reaction (difficulty breathing--STAT)?   4. What is your medication issue? Patient's daughter states the office was going to change the prescription to 25 mg tablets, but the patient has a whole bottle of 50 mg tablets. She states she does not need the medication now, but future refills can be the 25 mg tablets.

## 2020-02-11 LAB — COMPREHENSIVE METABOLIC PANEL
ALT: 8 IU/L (ref 0–32)
AST: 16 IU/L (ref 0–40)
Albumin/Globulin Ratio: 1.3 (ref 1.2–2.2)
Albumin: 3.9 g/dL (ref 3.5–4.6)
Alkaline Phosphatase: 16 IU/L — ABNORMAL LOW (ref 44–121)
BUN/Creatinine Ratio: 11 — ABNORMAL LOW (ref 12–28)
BUN: 12 mg/dL (ref 10–36)
Bilirubin Total: 0.2 mg/dL (ref 0.0–1.2)
CO2: 23 mmol/L (ref 20–29)
Calcium: 9 mg/dL (ref 8.7–10.3)
Chloride: 103 mmol/L (ref 96–106)
Creatinine, Ser: 1.06 mg/dL — ABNORMAL HIGH (ref 0.57–1.00)
GFR calc Af Amer: 52 mL/min/{1.73_m2} — ABNORMAL LOW (ref >59)
GFR calc non Af Amer: 45 mL/min/{1.73_m2} — ABNORMAL LOW (ref >59)
Globulin, Total: 2.9 g/dL (ref 1.5–4.5)
Glucose: 92 mg/dL (ref 65–99)
Potassium: 4 mmol/L (ref 3.5–5.2)
Sodium: 142 mmol/L (ref 134–144)
Total Protein: 6.8 g/dL (ref 6.0–8.5)

## 2020-02-11 LAB — CBC
Hematocrit: 34.8 % (ref 34.0–46.6)
Hemoglobin: 11.3 g/dL (ref 11.1–15.9)
MCH: 29.7 pg (ref 26.6–33.0)
MCHC: 32.5 g/dL (ref 31.5–35.7)
MCV: 91 fL (ref 79–97)
Platelets: 193 10*3/uL (ref 150–450)
RBC: 3.81 x10E6/uL (ref 3.77–5.28)
RDW: 13.6 % (ref 11.7–15.4)
WBC: 6.8 10*3/uL (ref 3.4–10.8)

## 2020-02-18 ENCOUNTER — Other Ambulatory Visit: Payer: Self-pay | Admitting: Family Medicine

## 2020-02-18 DIAGNOSIS — I10 Essential (primary) hypertension: Secondary | ICD-10-CM

## 2020-02-23 ENCOUNTER — Other Ambulatory Visit: Payer: Self-pay | Admitting: Family Medicine

## 2020-02-29 ENCOUNTER — Other Ambulatory Visit: Payer: Self-pay | Admitting: Family Medicine

## 2020-02-29 DIAGNOSIS — G4700 Insomnia, unspecified: Secondary | ICD-10-CM

## 2020-02-29 NOTE — Telephone Encounter (Signed)
Requesting: alprazolam 0.25mg  Contract: 02/05/2014 UDS: 02/24/2016 Last Visit: 10/29/2019 Next Visit: 05/09/2020 Last Refill: 08/26/2019 #30 and 1RF  Please Advise

## 2020-03-04 ENCOUNTER — Telehealth: Payer: Self-pay | Admitting: Pharmacist

## 2020-03-04 NOTE — Progress Notes (Addendum)
Chronic Care Management Pharmacy Assistant   Name: [name] [name]  MRN: [id number] DOB: 03-05-1924  Reason for Encounter: Disease State FOR HTN.  Patient Questions:  1.  Have you seen any other providers since your last visit? No.    2.  Any changes in your medicines or health? No.   PCP : Mosie Lukes, MD   Office Visits; 02/10/20 Cardiology [place] [facility], [place] M, PA-C. No medication changes.  Consults: None since 01/29/20  Allergies:   Allergies  Allergen Reactions   Amlodipine Shortness Of Breath   Darifenacin Hydrobromide Other (See Comments)    dizziness   Other Other (See Comments)    dizziness   Sulfonamide Derivatives Other (See Comments)    dizziness   Tramadol Other (See Comments)    Insomnia, anorexia    Medications: Outpatient Encounter Medications as of 03/04/2020  Medication Sig   ALPRAZolam (XANAX) 0.25 MG tablet TAKE 0.5-1 TABLETS (0.125-0.25 MG TOTAL) BY MOUTH 2 (TWO) TIMES DAILY AS NEEDED FOR ANXIETY.   Cholecalciferol (VITAMIN D) 2000 UNITS CAPS Take 2,000 Units by mouth every other day.   ELIQUIS 2.5 MG TABS tablet TAKE 1 TABLET BY MOUTH TWICE A DAY   famotidine (PEPCID) 20 MG tablet Take 40 mg by mouth at bedtime.    hydrALAZINE (APRESOLINE) 10 MG tablet TAKE 1 TABLET BY MOUTH THREE TIMES A DAY   isosorbide mononitrate (IMDUR) 30 MG 24 hr tablet Take 1 tablet (30 mg total) by mouth daily.   levocetirizine (XYZAL) 5 MG tablet TAKE 1/2 TABLET (2.5 MG TOTAL) BY MOUTH EVERY EVENING.   metoprolol succinate (TOPROL XL) 25 MG 24 hr tablet Take 1 tablet (25 mg total) by mouth daily.   pantoprazole (PROTONIX) 40 MG tablet TAKE 1 TABLET BY MOUTH EVERY DAY   ramipril (ALTACE) 10 MG capsule TAKE 1 CAPSULE (10 MG TOTAL) BY MOUTH 2 (TWO) TIMES DAILY.   vitamin B-12 (CYANOCOBALAMIN) 1000 MCG tablet Take 1,000 mcg by mouth daily.   No facility-administered encounter medications on file as of 03/04/2020.    Current Diagnosis: Patient Active Problem List    Diagnosis Date Noted   Chest pain of uncertain etiology 76/72/0947   Malnutrition ([place]) 11/09/2019   PAF (paroxysmal atrial fibrillation) ([place])    Esophageal dysphagia 10/29/2018   Cough 09/02/2017   Hyponatremia 06/29/2017   Bradycardia 06/29/2017   Chest pain 12/10/2016   Pedal edema 10/01/2016   Preventative health care 03/18/2016   Vitamin B12 deficiency 03/16/2016   Renal lithiasis 06/26/2015   Loss of weight 06/05/2015   Diverticulitis of colon 02/06/2015   Right-sided thoracic back pain 11/15/2014   Nipple discharge 06/21/2014   Rib pain on right side 04/29/2014   Medicare annual wellness visit, subsequent 02/05/2014   Abdominal pain, chronic, right lower quadrant 02/03/2014   Rectal bleeding 12/03/2013   Prolapse of female pelvic organs 12/03/2013   Leukocytes in urine 11/25/2013   Abnormal chest x-ray 10/23/2013   Pulmonary hypertension ([place]) 10/09/2013   Tricuspid regurgitation 10/09/2013   Chronic anticoagulation 10/09/2013   Hyperglycemia 08/24/2013   Encounter for therapeutic drug monitoring 03/13/2013   Renal insufficiency 06/28/2012   Hyperkalemia 06/28/2012   Headache 06/22/2012   Sinus bradycardia 09/20/2011   Urinary frequency 02/13/2011   Arthritis 11/13/2010   Long term (current) use of anticoagulants 03/31/2010   DIZZINESS 08/03/2009   Achalasia 05/18/2009   DYSPHAGIA UNSPECIFIED 04/11/2009   Anemia 01/19/2009   ESOPHAGEAL STRICTURE 01/19/2009   Anxiety state 09/15/2008   MITRAL REGURGITATION,  MILD 08/20/2008   Persistent atrial fibrillation ([place]) 08/20/2008   MITRAL VALVE PROLAPSE, HX OF 08/20/2008   Vitamin D deficiency 01/15/2008   IBS 02/03/2007   Hypothyroidism 01/29/2007   Hyperlipidemia, mixed 01/29/2007   Essential hypertension 01/29/2007   GERD 01/29/2007   Disorder of bone and cartilage 01/29/2007   COLONIC POLYPS, HX OF 01/29/2007    Goals Addressed   None    Reviewed chart prior to disease state call. Spoke with patient  regarding BP  Recent Office Vitals: BP Readings from Last 3 Encounters:  02/10/20 120/60  11/11/19 (!) 152/66  11/10/19 (!) 143/55   Pulse Readings from Last 3 Encounters:  02/10/20 74  11/11/19 62  11/10/19 (!) 47    Wt Readings from Last 3 Encounters:  02/10/20 93 lb 9.6 oz (42.5 kg)  11/11/19 91 lb (41.3 kg)  11/09/19 92 lb 13 oz (42.1 kg)     Kidney Function Lab Results  Component Value Date/Time   CREATININE 1.06 (H) 02/10/2020 02:04 PM   CREATININE 0.99 11/09/2019 06:56 AM   CREATININE 1.15 (H) 10/29/2019 01:58 PM   CREATININE 0.99 02/05/2014 03:01 PM   GFR 52.30 (L) 06/12/2019 10:33 AM   GFRNONAA 45 (L) 02/10/2020 02:04 PM   GFRNONAA 53 (L) 11/09/2019 06:56 AM   GFRAA 52 (L) 02/10/2020 02:04 PM    BMP Latest Ref Rng & Units 02/10/2020 11/09/2019 10/29/2019  Glucose 65 - 99 mg/dL 92 101(H) 76  BUN 10 - 36 mg/dL 12 14 15   Creatinine 0.57 - 1.00 mg/dL 1.06(H) 0.99 1.15(H)  BUN/Creat Ratio 12 - 28 11(L) - 13  Sodium 134 - 144 mmol/L 142 141 139  Potassium 3.5 - 5.2 mmol/L 4.0 3.5 4.3  Chloride 96 - 106 mmol/L 103 105 102  CO2 20 - 29 mmol/L 23 25 25   Calcium 8.7 - 10.3 mg/dL 9.0 8.0(L) 8.6    Current antihypertensive regimen:  Hydralazine 10 mg 1 tablet three times a day Metoprolol 25 mg 1 tablet daily Ramipril 10 mg 1 capsule two times daily  How often are you checking your Blood Pressure? 3-5x per week   Current home BP readings: Patent stated she does not write her blood pressure readings down.  What recent interventions/DTPs have been made by any provider to improve Blood Pressure control since last CPP Visit: None.  Any recent hospitalizations or ED visits since last visit with CPP? Patient stated no.  What diet changes have been made to improve Blood Pressure Control?  She stated she drinks a lot smoothies. She stated she only drinks about 8 oz of water a day. Patient stated she tries to stay away from salty food.   What exercise is being done to  improve your Blood Pressure Control?  Patient stated she does house work daily, She stated she cooks and cleans up a little bit but she has her daughter there with her to help her around the house.  Adherence Review: Is the patient currently on ACE/ARB medication? No.  Does the patient have >5 day gap between last estimated fill dates? No  Patient stated she does not have any questions or concerns about her medications at this time but she would like to know if her Xanax was refilled, I sent a message to the nurse in regards of her medication.   Follow-Up:  Pharmacist Review   Charlann Lange, RMA Clinical Pharmacist Assistant [phone]  10 minutes spent in review, coordination, and documentation.  Reviewed by: Beverly Milch, PharmD Clinical Pharmacist  [place] [phone]

## 2020-03-09 ENCOUNTER — Telehealth: Payer: Self-pay | Admitting: *Deleted

## 2020-03-09 NOTE — Telephone Encounter (Signed)
-----   Message from [place], [place] sent at 03/04/2020  2:28 PM EST ----- Regarding: FW: Medication refill  ----- Message ----- From: [place], Trevor Mace Sent: 03/04/2020   2:22 PM EST To: Damita Dunnings, CMA Subject: Medication refill                              Good afternoon I spoke with the patient earlier and she was wondering if her medication Xanax was sent to the pharmacy or not, please advise?

## 2020-03-09 NOTE — Telephone Encounter (Signed)
Left detailed message on machine that rx has been sent in. 

## 2020-03-12 ENCOUNTER — Other Ambulatory Visit: Payer: Self-pay | Admitting: Family Medicine

## 2020-03-12 DIAGNOSIS — I10 Essential (primary) hypertension: Secondary | ICD-10-CM

## 2020-03-15 ENCOUNTER — Other Ambulatory Visit: Payer: Self-pay | Admitting: Family Medicine

## 2020-03-28 ENCOUNTER — Other Ambulatory Visit: Payer: Self-pay | Admitting: Family Medicine

## 2020-03-30 ENCOUNTER — Ambulatory Visit: Payer: Medicare Other | Admitting: Cardiology

## 2020-04-11 ENCOUNTER — Other Ambulatory Visit: Payer: Self-pay | Admitting: Family Medicine

## 2020-04-11 DIAGNOSIS — I10 Essential (primary) hypertension: Secondary | ICD-10-CM

## 2020-04-25 ENCOUNTER — Other Ambulatory Visit: Payer: Self-pay | Admitting: Cardiology

## 2020-04-25 ENCOUNTER — Other Ambulatory Visit: Payer: Self-pay | Admitting: Family Medicine

## 2020-04-25 DIAGNOSIS — I10 Essential (primary) hypertension: Secondary | ICD-10-CM

## 2020-04-25 MED ORDER — APIXABAN 2.5 MG PO TABS
2.5000 mg | ORAL_TABLET | Freq: Two times a day (BID) | ORAL | 5 refills | Status: DC
Start: 2020-04-25 — End: 2021-04-28

## 2020-04-25 NOTE — Addendum Note (Signed)
Addended by: Lutricia Feil on: 04/25/2020 07:57 AM   Modules accepted: Orders

## 2020-04-25 NOTE — Telephone Encounter (Signed)
Prescription refill request for Eliquis received. Indication: afib  Last office visit: [place], 02/10/2020 Scr: 1.06,02/10/2020 Age: 87  Weight: 42.5 kg   Pt is on the correct dose of Eliquis per dosing criteria, prescription refill sent for Eliquis 2.5mg  BID.

## 2020-05-09 ENCOUNTER — Encounter: Payer: Medicare Other | Admitting: Family Medicine

## 2020-05-15 NOTE — Progress Notes (Deleted)
Cardiology Office Note:    Date:  05/15/2020   ID:  [name] [name], DOB 04/23/1924, MRN [id number]  PCP:  [name] Lukes, MD   [facility] Cardiologist:  [name] Dawley, MD (Inactive) {    Referring MD: [name] Lukes, MD    History of Present Illness:    [name] [name] is a 87 y.o. female with a hx of persistent atrial fibrillation, HTN, sinus bradycardia, HLD, anemia, anxiety, arthritis, diverticulosis, GERD, and thyroid disease who was previously followed by Dr. Meda [name] who now presents to clinic for follow-up.  Per review of the record, she been felt to have had chronic atrial fib. TTE 2018 showed EF 55-60%, mild MR, moderate LAE, mild TR, normal PASP. She was seen in the ER 06/20/17 with discomfort in chest, upper abdomen and back. CT angio chest/abd/pelvis showed no acute dissection/ aneurysm, + atherosclerotic vascular disease with suspected renal artery stenosis, possible 16 mmhyperenhancing splenic mass, sigmoid diverticular disease . Out-patient w/u of splenic mass recommended. She was then re-hospitalized with weakness and decreased appetite found to be hyponatremic and slightly bradycardic.  She was treated for possible SIADH. Initially the patient was bradycardic and her beta-blocker was held but heart rate increased to greater than 100. She was seen in consultation by Dr. Marlou [name] who felt that her rhythm was actually atrial flutter appearing. He advised continued rate control on low-dose Toprol and watch for bradycardia. Her Coumadin was switched to Eliquis during begininng of Covid pandemic. Event monitoring in 09/2018 with findings of persistent atrial flutter with variable block with minimum HR 45, maximum HR 136 and average HR 65 BPM. 1 episode of ventricular tachycardia lasting 8 beats.  Last saw [name] [name] on 02/10/20 where she was having intermittent palpitations but was otherwise doing well.  Today,    Past Medical History:  Diagnosis Date  . Anemia    . Anxiety   . Arthritis    hips, knees  . Current use of long term anticoagulation   . Diverticulitis of colon 02/06/2015  . Gait difficulty   . GERD (gastroesophageal reflux disease)   . Headache(784.0) 06/22/2012  . Hyperlipidemia   . Hypertension   . IBS (irritable bowel syndrome)   . Incontinence   . Loss of weight 06/05/2015  . Medicare annual wellness visit, subsequent 02/05/2014  . Melena   . Mild mitral regurgitation   . Mild tricuspid regurgitation   . Mitral and aortic regurgitation   . Mitral valve prolapse    hx of - not seen on recent echoes  . Osteopenia   . Paroxysmal atrial flutter ([place])   . Pedal edema 10/01/2016  . Persistent atrial fibrillation ([place])   . Personal history of colonic polyps   . Renal lithiasis 06/26/2015  . Sinus bradycardia   . Thyroid disease    hypo  . Vitamin B12 deficiency 03/16/2016  . Vitamin D deficiency     Past Surgical History:  Procedure Laterality Date  . BOTOX INJECTION N/A 09/03/2012   Procedure: BOTOX INJECTION;  Surgeon: [name] Castle, MD;  Location: [facility];  Service: Endoscopy;  Laterality: N/A;  . BREAST BIOPSY Left   . BREAST LUMPECTOMY Right 08/18/2014   Procedure: RIGHT BREAST LUMPECTOMY;  Surgeon: Coralie Keens, MD;  Location: [facility];  Service: General;  Laterality: Right;  . CARDIAC ELECTROPHYSIOLOGY MAPPING AND ABLATION    . CATARACT EXTRACTION, BILATERAL    . ESOPHAGOGASTRODUODENOSCOPY N/A 09/03/2012   Procedure: ESOPHAGOGASTRODUODENOSCOPY (EGD);  Surgeon: [name] [name]  [name] Adler, MD;  Location: [facility];  Service: Endoscopy;  Laterality: N/A;  . ESOPHAGOGASTRODUODENOSCOPY (EGD) WITH PROPOFOL N/A 12/02/2018   Procedure: ESOPHAGOGASTRODUODENOSCOPY (EGD) WITH PROPOFOL;  Surgeon: [name] Stabler, MD;  [facility];  Service: Gastroenterology;  Laterality: N/A;  . ESOPHAGOSCOPY W/ BOTOX INJECTION    . I & D EXTREMITY Right 11/06/2012   Procedure: IRRIGATION AND DEBRIDEMENT EXTREMITY Right  Ring Finger;  Surgeon: [name] Must, MD;  Location: [facility];  Service: Orthopedics;  Laterality: Right;  . PARTIAL HYSTERECTOMY     ovaries left in place  . skin cancer removal      Current Medications: No outpatient medications have been marked as taking for the 05/18/20 encounter (Appointment) with [name] Bergeron, MD.     Allergies:   Amlodipine, Darifenacin hydrobromide, Other, Sulfonamide derivatives, and Tramadol   Social History   Socioeconomic History  . Marital status: Widowed    Spouse name: Not on file  . Number of children: 5  . Years of education: Not on file  . Highest education level: Not on file  Occupational History  . Occupation: retired    Fish farm manager: RETIRED  Tobacco Use  . Smoking status: Never Smoker  . Smokeless tobacco: Never Used  Vaping Use  . Vaping Use: Never used  Substance and Sexual Activity  . Alcohol use: No  . Drug use: No  . Sexual activity: Not on file    Comment: lives with Daughter, grandson and his family. no dietary restricitons.  Other Topics Concern  . Not on file  Social History Narrative  . Not on file   Social Determinants of Health   Financial Resource Strain: Not on file  Food Insecurity: Not on file  Transportation Needs: Not on file  Physical Activity: Not on file  Stress: Not on file  Social Connections: Not on file     Family History: The patient's ***family history includes Allergies in her daughter; COPD in her daughter, daughter, and father; CVA in her mother; Diabetes in her mother; Heart disease in her maternal aunt and maternal uncle; Rheum arthritis in her father; Stroke in her daughter, maternal grandfather, and mother. There is no history of Colon cancer, Colon polyps, Esophageal cancer, Gallbladder disease, Kidney disease, Heart attack, or Hypertension.  ROS:   Please see the history of present illness.    *** All other systems reviewed and are negative.  EKGs/Labs/Other Studies Reviewed:    The  following studies were reviewed today: Monitor 09/2018 Study Highlights    Persistent atrial flutter with variable block with minimum HR 45, maximum HR 136 and average HR 65 BPM.  1 episode of ventricular tachycardia lasting 8 beats.  Persistent atrial flutter with variable block with minimum HR 45, maximum HR 136 and average HR 65 BPM. Continue current medical management.    Echo 2018 Study Conclusions   - Left ventricle: The cavity size was normal. There was mild  concentric hypertrophy. Systolic function was normal. The  estimated ejection fraction was in the range of 55% to 60%. Wall  motion was normal; there were no regional wall motion  abnormalities. The study is not technically sufficient to allow  evaluation of LV diastolic function.  - Aortic valve: Transvalvular velocity was within the normal range.  There was no stenosis. There was no regurgitation.  - Mitral valve: Transvalvular velocity was within the normal range.  There was no evidence for stenosis. There was mild regurgitation.  - Left atrium: The atrium  was moderately dilated.  - Right ventricle: Systolic function was normal.  - Atrial septum: No defect or patent foramen ovale was identified.  - Tricuspid valve: There was mild regurgitation.  - Pulmonary arteries: Systolic pressure was within the normal  range. PA peak pressure: 36 mm Hg (S).    EKG:  EKG is *** ordered today.  The ekg ordered today demonstrates ***  Recent Labs: 10/29/2019: TSH 3.37 02/10/2020: ALT 8; BUN 12; Creatinine, Ser 1.06; Hemoglobin 11.3; Platelets 193; Potassium 4.0; Sodium 142  Recent Lipid Panel    Component Value Date/Time   CHOL 128 10/29/2019 1358   TRIG 114 10/29/2019 1358   HDL 36 (L) 10/29/2019 1358   CHOLHDL 3.6 10/29/2019 1358   VLDL 19.6 06/12/2019 1033   LDLCALC 72 10/29/2019 1358   LDLDIRECT 72.1 01/19/2009 1026     Risk Assessment/Calculations:   {Does this patient have ATRIAL  FIBRILLATION?[phone]}   Physical Exam:    VS:  There were no vitals taken for this visit.    Wt Readings from Last 3 Encounters:  02/10/20 93 lb 9.6 oz (42.5 kg)  11/11/19 91 lb (41.3 kg)  11/09/19 92 lb 13 oz (42.1 kg)     GEN: *** Well nourished, well developed in no acute distress HEENT: Normal NECK: No JVD; No carotid bruits LYMPHATICS: No lymphadenopathy CARDIAC: ***RRR, no murmurs, rubs, gallops RESPIRATORY:  Clear to auscultation without rales, wheezing or rhonchi  ABDOMEN: Soft, non-tender, non-distended MUSCULOSKELETAL:  No edema; No deformity  SKIN: Warm and dry NEUROLOGIC:  Alert and oriented x 3 PSYCHIATRIC:  Normal affect   ASSESSMENT:    No diagnosis found. PLAN:    In order of problems listed above:  #Persistent Afib: #Paroxysmal Aflutter: CHADs-vasc 5. On eliquis and low dose metop. No bleeding issues. -Continue apixaban 2.5mg  BID -Continue metop 25mg  XL daily  #HTN: -Controlled ramipril 10mg  daily -Continue metop 25mg  XL daily  #HLD: -Not on statin; given age, will not re-check lipids  #Mild MR: Asymptomatic. No further monitoring indicated  {Are you ordering a CV Procedure (e.g. stress test, cath, DCCV, TEE, etc)?   Press F2        :[id number]}    Medication Adjustments/Labs and Tests Ordered: Current medicines are reviewed at length with the patient today.  Concerns regarding medicines are outlined above.  No orders of the defined types were placed in this encounter.  No orders of the defined types were placed in this encounter.   There are no Patient Instructions on file for this visit.   Signed, [name] Bergeron, MD  05/15/2020 8:40 PM    [facility]

## 2020-05-18 ENCOUNTER — Ambulatory Visit: Payer: Medicare Other | Admitting: Cardiology

## 2020-05-18 ENCOUNTER — Encounter: Payer: Self-pay | Admitting: Cardiology

## 2020-05-18 ENCOUNTER — Other Ambulatory Visit: Payer: Self-pay

## 2020-05-18 VITALS — BP 156/80 | HR 62 | Ht 59.0 in | Wt 91.0 lb

## 2020-05-18 DIAGNOSIS — E785 Hyperlipidemia, unspecified: Secondary | ICD-10-CM | POA: Diagnosis not present

## 2020-05-18 DIAGNOSIS — I080 Rheumatic disorders of both mitral and aortic valves: Secondary | ICD-10-CM

## 2020-05-18 DIAGNOSIS — I4892 Unspecified atrial flutter: Secondary | ICD-10-CM | POA: Diagnosis not present

## 2020-05-18 DIAGNOSIS — I340 Nonrheumatic mitral (valve) insufficiency: Secondary | ICD-10-CM | POA: Diagnosis not present

## 2020-05-18 DIAGNOSIS — I10 Essential (primary) hypertension: Secondary | ICD-10-CM | POA: Diagnosis not present

## 2020-05-18 NOTE — Progress Notes (Signed)
Cardiology Office Note:    Date:  05/18/2020   ID:  [name] [name], DOB [birth year], MRN [id number]  PCP:  Mosie Lukes, MD   [facility] Cardiologist:  Ena Dawley, MD (Inactive) {    Referring MD: Mosie Lukes, MD    History of Present Illness:    [name] [name] is a 87 y.o. female with a hx of persistent atrial fibrillation, HTN, sinus bradycardia, HLD, anemia, anxiety, arthritis, diverticulosis, GERD, and thyroid disease who was previously followed by Dr. [name] who now presents to clinic for follow-up.  Per review of the record, she been felt to have had chronic atrial fib. TTE 2018 showed EF 55-60%, mild MR, moderate LAE, mild TR, normal PASP. She was seen in the ER 06/20/17 with discomfort in chest, upper abdomen and back. CT angio chest/abd/pelvis showed no acute dissection/ aneurysm, + atherosclerotic vascular disease with suspected renal artery stenosis, possible 16 mmhyperenhancing splenic mass, sigmoid diverticular disease . Out-patient w/u of splenic mass recommended. She was then re-hospitalized with weakness and decreased appetite found to be hyponatremic and slightly bradycardic.  She was treated for possible SIADH. Initially the patient was bradycardic and her beta-blocker was held but heart rate increased to greater than 100. She was seen in consultation by Dr. Marlou Porch who felt that her rhythm was actually atrial flutter appearing. He advised continued rate control on low-dose Toprol and watch for bradycardia. Her Coumadin was switched to Eliquis during begininng of Covid pandemic. Event monitoring in 09/2018 with findings of persistent atrial flutter with variable block with minimum HR 45, maximum HR 136 and average HR 65 BPM. 1 episode of ventricular tachycardia lasting 8 beats.  Last saw [name] on 02/10/20 where she was having intermittent palpitations but was otherwise doing well.  Today, she is doing reasonably well. She is accompanied by a  family member. Her palpitations have improved since her last visit, however she does occasionally feel skips in her chest that she describes as loud but  It is nothing she is concerned about. She is tolerating her medications at this time. She has not been taking her blood pressure at home consistently. She is currently taking Eliquis 2.5 mg-denies having any spontaneous bleeding, blood in stool, or hematuria. She is physically active, but does not participate in formal exercise. She denies having any exertional shortness of breath, chest tightness, pressure, or pain, palpitations. She has no  PND, orthopnea, LE edema, lightedness, pre-syncopal, or syncopal episodes.    Past Medical History:  Diagnosis Date  . Anemia   . Anxiety   . Arthritis    hips, knees  . Current use of long term anticoagulation   . Diverticulitis of colon 02/06/2015  . Gait difficulty   . GERD (gastroesophageal reflux disease)   . Headache(784.0) 06/22/2012  . Hyperlipidemia   . Hypertension   . IBS (irritable bowel syndrome)   . Incontinence   . Loss of weight 06/05/2015  . Medicare annual wellness visit, subsequent 02/05/2014  . Melena   . Mild mitral regurgitation   . Mild tricuspid regurgitation   . Mitral and aortic regurgitation   . Mitral valve prolapse    hx of - not seen on recent echoes  . Osteopenia   . Paroxysmal atrial flutter ([place])   . Pedal edema 10/01/2016  . Persistent atrial fibrillation ([place])   . Personal history of colonic polyps   . Renal lithiasis 06/26/2015  . Sinus bradycardia   . Thyroid disease  hypo  . Vitamin B12 deficiency 03/16/2016  . Vitamin D deficiency     Past Surgical History:  Procedure Laterality Date  . BOTOX INJECTION N/A 09/03/2012   Procedure: BOTOX INJECTION;  Surgeon: Inda Castle, MD;  [facility]: [facility];  Service: Endoscopy;  Laterality: N/A;  . BREAST BIOPSY Left   . BREAST LUMPECTOMY Right 08/18/2014   Procedure: RIGHT BREAST LUMPECTOMY;  Surgeon: Coralie Keens, MD;  Location: [facility];  Service: General;  Laterality: Right;  . CARDIAC ELECTROPHYSIOLOGY MAPPING AND ABLATION    . CATARACT EXTRACTION, BILATERAL    . ESOPHAGOGASTRODUODENOSCOPY N/A 09/03/2012   Procedure: ESOPHAGOGASTRODUODENOSCOPY (EGD);  Surgeon: Inda Castle, MD;  Location: [facility];  Service: Endoscopy;  Laterality: N/A;  . ESOPHAGOGASTRODUODENOSCOPY (EGD) WITH PROPOFOL N/A 12/02/2018   Procedure: ESOPHAGOGASTRODUODENOSCOPY (EGD) WITH PROPOFOL;  Surgeon: [name], MD;  [facility];  Service: Gastroenterology;  Laterality: N/A;  . ESOPHAGOSCOPY W/ BOTOX INJECTION    . I & D EXTREMITY Right 11/06/2012   Procedure: IRRIGATION AND DEBRIDEMENT EXTREMITY Right Ring Finger;  Surgeon: Tennis Must, MD;  Location: [facility];  Service: Orthopedics;  Laterality: Right;  . PARTIAL HYSTERECTOMY     ovaries left in place  . skin cancer removal      Current Medications: Current Meds  Medication Sig  . ALPRAZolam (XANAX) 0.25 MG tablet TAKE 0.5-1 TABLETS (0.125-0.25 MG TOTAL) BY MOUTH 2 (TWO) TIMES DAILY AS NEEDED FOR ANXIETY.  Marland Kitchen apixaban (ELIQUIS) 2.5 MG TABS tablet Take 1 tablet (2.5 mg total) by mouth 2 (two) times daily.  . Cholecalciferol (VITAMIN D) 2000 UNITS CAPS Take 2,000 Units by mouth every other day.  . famotidine (PEPCID) 20 MG tablet Take 40 mg by mouth at bedtime.   . hydrALAZINE (APRESOLINE) 10 MG tablet TAKE 1 TABLET BY MOUTH THREE TIMES A DAY  . isosorbide mononitrate (IMDUR) 30 MG 24 hr tablet Take 1 tablet (30 mg total) by mouth daily.  Marland Kitchen levocetirizine (XYZAL) 5 MG tablet TAKE 1/2 TABLET (2.5 MG TOTAL) BY MOUTH EVERY EVENING.  . metoprolol succinate (TOPROL XL) 25 MG 24 hr tablet Take 1 tablet (25 mg total) by mouth daily.  . pantoprazole (PROTONIX) 40 MG tablet TAKE 1 TABLET BY MOUTH EVERY DAY  . ramipril (ALTACE) 10 MG capsule TAKE 1 CAPSULE (10 MG TOTAL) BY MOUTH 2 (TWO) TIMES DAILY.  . vitamin B-12 (CYANOCOBALAMIN) 1000  MCG tablet Take 1,000 mcg by mouth daily.     Allergies:   Amlodipine, Darifenacin hydrobromide, Other, Sulfonamide derivatives, and Tramadol   Social History   Socioeconomic History  . Marital status: Widowed    Spouse name: Not on file  . Number of children: 5  . Years of education: Not on file  . Highest education level: Not on file  Occupational History  . Occupation: retired    Fish farm manager: RETIRED  Tobacco Use  . Smoking status: Never Smoker  . Smokeless tobacco: Never Used  Vaping Use  . Vaping Use: Never used  Substance and Sexual Activity  . Alcohol use: No  . Drug use: No  . Sexual activity: Not on file    Comment: lives with Daughter, grandson and his family. no dietary restricitons.  Other Topics Concern  . Not on file  Social History Narrative  . Not on file   Social Determinants of Health   Financial Resource Strain: Not on file  Food Insecurity: Not on file  Transportation Needs: Not on file  Physical Activity: Not on file  Stress: Not on file  Social Connections: Not on file     Family History: The patient's family history includes Allergies in her daughter; COPD in her daughter, daughter, and father; CVA in her mother; Diabetes in her mother; Heart disease in her maternal aunt and maternal uncle; Rheum arthritis in her father; Stroke in her daughter, maternal grandfather, and mother. There is no history of Colon cancer, Colon polyps, Esophageal cancer, Gallbladder disease, Kidney disease, Heart attack, or Hypertension.  ROS:   Review of Systems  Constitutional: Negative for chills, diaphoresis and fever.  HENT: Negative for sore throat.   Respiratory: Negative for cough and shortness of breath.   Cardiovascular: Positive for palpitations (skips in her heart beat). Negative for chest pain, orthopnea, leg swelling and PND.  Gastrointestinal: Negative for abdominal pain, blood in stool, diarrhea, heartburn, nausea and vomiting.  Genitourinary: Negative  for dysuria, hematuria and urgency.  Neurological: Negative for dizziness.     EKGs/Labs/Other Studies Reviewed:    The following studies were reviewed today: Monitor 09/2018 Study Highlights    Persistent atrial flutter with variable block with minimum HR 45, maximum HR 136 and average HR 65 BPM.  1 episode of ventricular tachycardia lasting 8 beats.  Persistent atrial flutter with variable block with minimum HR 45, maximum HR 136 and average HR 65 BPM. Continue current medical management.    Echo 2018 Study Conclusions   - Left ventricle: The cavity size was normal. There was mild  concentric hypertrophy. Systolic function was normal. The  estimated ejection fraction was in the range of 55% to 60%. Wall  motion was normal; there were no regional wall motion  abnormalities. The study is not technically sufficient to allow  evaluation of LV diastolic function.  - Aortic valve: Transvalvular velocity was within the normal range.  There was no stenosis. There was no regurgitation.  - Mitral valve: Transvalvular velocity was within the normal range.  There was no evidence for stenosis. There was mild regurgitation.  - Left atrium: The atrium was moderately dilated.  - Right ventricle: Systolic function was normal.  - Atrial septum: No defect or patent foramen ovale was identified.  - Tricuspid valve: There was mild regurgitation.  - Pulmonary arteries: Systolic pressure was within the normal  range. PA peak pressure: 36 mm Hg (S).    EKGs 05/18/20-EKG is not ordered today.   Recent Labs: 10/29/2019: TSH 3.37 02/10/2020: ALT 8; BUN 12; Creatinine, Ser 1.06; Hemoglobin 11.3; Platelets 193; Potassium 4.0; Sodium 142  Recent Lipid Panel    Component Value Date/Time   CHOL 128 10/29/2019 1358   TRIG 114 10/29/2019 1358   HDL 36 (L) 10/29/2019 1358   CHOLHDL 3.6 10/29/2019 1358   VLDL 19.6 06/12/2019 1033   LDLCALC 72 10/29/2019 1358   LDLDIRECT 72.1  01/19/2009 1026     Physical Exam:    VS:  BP (!) 156/80   Pulse 62   Ht 4\' 11"  (1.499 m)   Wt 91 lb (41.3 kg)   SpO2 96%   BMI 18.38 kg/m     Wt Readings from Last 3 Encounters:  05/18/20 91 lb (41.3 kg)  02/10/20 93 lb 9.6 oz (42.5 kg)  11/11/19 91 lb (41.3 kg)     GEN: Well nourished, well developed in no acute distress HEENT: Normal NECK: No JVD; No carotid bruits LYMPHATICS: No lymphadenopathy CARDIAC: RRR, no murmurs, rubs, gallops RESPIRATORY:  Clear to auscultation without rales, wheezing or  rhonchi  ABDOMEN: Soft, non-tender, non-distended MUSCULOSKELETAL:  No edema; No deformity  SKIN: Warm and dry NEUROLOGIC:  Alert and oriented x 3 PSYCHIATRIC:  Normal affect   ASSESSMENT:    1. Paroxysmal atrial flutter ([place])   2. Essential hypertension   3. MITRAL REGURGITATION, MILD   4. Mild mitral regurgitation   5. Hyperlipidemia, unspecified hyperlipidemia type    PLAN:    In order of problems listed above:  #Persistent Afib: #Paroxysmal Aflutter: CHADs-vasc 5. On eliquis and low dose metop. No bleeding issues. Doing very well today. -Continue apixaban 2.5mg  BID -Continue metop 25mg  XL daily  #HTN: -Controlled ramipril 10mg  daily -Continue metop 25mg  XL daily -Continue hydralazine 10mg  TID -Continue imdur 30mg  daily  #HLD: -Not on statin; given age, will not re-check lipids  #Mild MR: Asymptomatic. No further monitoring indicated unless clinical change.   Medication Adjustments/Labs and Tests Ordered: Current medicines are reviewed at length with the patient today.  Concerns regarding medicines are outlined above.  No orders of the defined types were placed in this encounter.  No orders of the defined types were placed in this encounter.   Patient Instructions  Medication Instructions:   Your physician recommends that you continue on your current medications as directed. Please refer to the Current Medication list given to you today.  *If  you need a refill on your cardiac medications before your next appointment, please call your pharmacy*   Follow-Up: At [facility], you and your health needs are our priority.  As part of our continuing mission to provide you with exceptional heart care, we have created designated Provider Care Teams.  These Care Teams include your primary Cardiologist (physician) and Advanced Practice Providers (APPs -  Physician Assistants and Nurse Practitioners) who all work together to provide you with the care you need, when you need it.  We recommend signing up for the patient portal called "MyChart".  Sign up information is provided on this After Visit Summary.  MyChart is used to connect with patients for Virtual Visits (Telemedicine).  Patients are able to view lab/test results, encounter notes, upcoming appointments, etc.  Non-urgent messages can be sent to your provider as well.   To learn more about what you can do with MyChart, go to [email or web address].    Your next appointment:   6 month(s)  The format for your next appointment:   In Person  Provider:   You will see one of the following Advanced Practice Providers on your designated Care Team:    Richardson Dopp, PA-C  Vin Balm, PA-C  DAYNA DUNN PA-C  Cecilie Kicks NP  MICHELE LENZE PA-C  JILL MCDANIEL NP        [employer] as a scribe for [name], MD.,have documented all relevant documentation on the behalf of [name], MD,as directed by  [name], MD while in the presence of [name], MD.  I, [name], MD, have reviewed all documentation for this visit. The documentation on 05/18/20 for the exam, diagnosis, procedures, and orders are all accurate and complete. Signed, [name], MD  05/18/2020 3:15 PM    [facility]

## 2020-05-18 NOTE — Patient Instructions (Signed)
Medication Instructions:   Your physician recommends that you continue on your current medications as directed. Please refer to the Current Medication list given to you today.  *If you need a refill on your cardiac medications before your next appointment, please call your pharmacy*   Follow-Up: At [facility], you and your health needs are our priority.  As part of our continuing mission to provide you with exceptional heart care, we have created designated Provider Care Teams.  These Care Teams include your primary Cardiologist (physician) and Advanced Practice Providers (APPs -  Physician Assistants and Nurse Practitioners) who all work together to provide you with the care you need, when you need it.  We recommend signing up for the patient portal called "MyChart".  Sign up information is provided on this After Visit Summary.  MyChart is used to connect with patients for Virtual Visits (Telemedicine).  Patients are able to view lab/test results, encounter notes, upcoming appointments, etc.  Non-urgent messages can be sent to your provider as well.   To learn more about what you can do with MyChart, go to [email or web address].[email or web address].    Your next appointment:   6 month(s)  The format for your next appointment:   In Person  Provider:   You will see one of the following Advanced Practice Providers on your designated Care Team:    Scott Weaver, PA-C  Vin Bhagat, PA-C  DAYNA DUNN PA-C  LAURA INGOLD NP  MICHELE LENZE PA-C  JILL MCDANIEL NP    

## 2020-05-23 ENCOUNTER — Other Ambulatory Visit: Payer: Self-pay

## 2020-05-23 DIAGNOSIS — R079 Chest pain, unspecified: Secondary | ICD-10-CM

## 2020-05-23 MED ORDER — ISOSORBIDE MONONITRATE ER 30 MG PO TB24: 30.0000 mg | ORAL_TABLET | Freq: Every day | ORAL | 3 refills | Status: DC

## 2020-05-23 NOTE — Telephone Encounter (Signed)
Pt's medication was sent to pt's pharmacy as requested. Confirmation received.  °

## 2020-06-21 ENCOUNTER — Ambulatory Visit (INDEPENDENT_AMBULATORY_CARE_PROVIDER_SITE_OTHER): Payer: Medicare Other | Admitting: Family Medicine

## 2020-06-21 ENCOUNTER — Other Ambulatory Visit: Payer: Self-pay

## 2020-06-21 VITALS — BP 146/64 | HR 70 | Temp 98.9°F | Resp 16 | Wt 90.8 lb

## 2020-06-21 DIAGNOSIS — I10 Essential (primary) hypertension: Secondary | ICD-10-CM | POA: Diagnosis not present

## 2020-06-21 DIAGNOSIS — N289 Disorder of kidney and ureter, unspecified: Secondary | ICD-10-CM | POA: Diagnosis not present

## 2020-06-21 DIAGNOSIS — E782 Mixed hyperlipidemia: Secondary | ICD-10-CM | POA: Diagnosis not present

## 2020-06-21 DIAGNOSIS — I4819 Other persistent atrial fibrillation: Secondary | ICD-10-CM | POA: Diagnosis not present

## 2020-06-21 DIAGNOSIS — E538 Deficiency of other specified B group vitamins: Secondary | ICD-10-CM

## 2020-06-21 DIAGNOSIS — E039 Hypothyroidism, unspecified: Secondary | ICD-10-CM

## 2020-06-21 DIAGNOSIS — R252 Cramp and spasm: Secondary | ICD-10-CM

## 2020-06-21 DIAGNOSIS — R739 Hyperglycemia, unspecified: Secondary | ICD-10-CM

## 2020-06-21 DIAGNOSIS — Z0000 Encounter for general adult medical examination without abnormal findings: Secondary | ICD-10-CM | POA: Diagnosis not present

## 2020-06-21 DIAGNOSIS — D649 Anemia, unspecified: Secondary | ICD-10-CM

## 2020-06-21 DIAGNOSIS — E559 Vitamin D deficiency, unspecified: Secondary | ICD-10-CM

## 2020-06-21 MED ORDER — LOSARTAN POTASSIUM 25 MG PO TABS: 25.0000 mg | ORAL_TABLET | Freq: Every day | ORAL | 1 refills | Status: DC

## 2020-06-21 NOTE — Patient Instructions (Signed)
For sleep magnesium glycinate 200 to 400 mg at night for sleep L Tryptophan or Melatonin 2-10 mg for sleep Warm milk with honey and vanilla and graham cracker or cracker  Preventive Care 85 Years and Older, Female Preventive care refers to lifestyle choices and visits with your health care provider that can promote health and wellness. This includes: A yearly physical exam. This is also called an annual wellness visit. Regular dental and eye exams. Immunizations. Screening for certain conditions. Healthy lifestyle choices, such as: Eating a healthy diet. Getting regular exercise. Not using drugs or products that contain nicotine and tobacco. Limiting alcohol use. What can I expect for my preventive care visit? Physical exam Your health care provider will check your: Height and weight. These may be used to calculate your BMI (body mass index). BMI is a measurement that tells if you are at a healthy weight. Heart rate and blood pressure. Body temperature. Skin for abnormal spots. Counseling Your health care provider may ask you questions about your: Past medical problems. Family's medical history. Alcohol, tobacco, and drug use. Emotional well-being. Home life and relationship well-being. Sexual activity. Diet, exercise, and sleep habits. History of falls. Memory and ability to understand (cognition). Work and work Statistician. Pregnancy and menstrual history. Access to firearms. What immunizations do I need?  Vaccines are usually given at various ages, according to a schedule. Your health care provider will recommend vaccines for you based on your age, medicalhistory, and lifestyle or other factors, such as travel or where you work. What tests do I need? Blood tests Lipid and cholesterol levels. These may be checked every 5 years, or more often depending on your overall health. Hepatitis C test. Hepatitis B test. Screening Lung cancer screening. You may have this screening  every year starting at age 54 if you have a 30-pack-year history of smoking and currently smoke or have quit within the past 15 years. Colorectal cancer screening. All adults should have this screening starting at age 37 and continuing until age 32. Your health care provider may recommend screening at age 58 if you are at increased risk. You will have tests every 1-10 years, depending on your results and the type of screening test. Diabetes screening. This is done by checking your blood sugar (glucose) after you have not eaten for a while (fasting). You may have this done every 1-3 years. Mammogram. This may be done every 1-2 years. Talk with your health care provider about how often you should have regular mammograms. Abdominal aortic aneurysm (AAA) screening. You may need this if you are a current or former smoker. BRCA-related cancer screening. This may be done if you have a family history of breast, ovarian, tubal, or peritoneal cancers. Other tests STD (sexually transmitted disease) testing, if you are at risk. Bone density scan. This is done to screen for osteoporosis. You may have this done starting at age 47. Talk with your health care provider about your test results, treatment options,and if necessary, the need for more tests. Follow these instructions at home: Eating and drinking  Eat a diet that includes fresh fruits and vegetables, whole grains, lean protein, and low-fat dairy products. Limit your intake of foods with high amounts of sugar, saturated fats, and salt. Take vitamin and mineral supplements as recommended by your health care provider. Do not drink alcohol if your health care provider tells you not to drink. If you drink alcohol: Limit how much you have to 0-1 drink a day. Be aware of  how much alcohol is in your drink. In the [place] one drink equals one 12 oz bottle of beer (355 mL), one 5 oz glass of wine (148 mL), or one 1 oz glass of hard liquor (44  mL).  Lifestyle Take daily care of your teeth and gums. Brush your teeth every morning and night with fluoride toothpaste. Floss one time each day. Stay active. Exercise for at least 30 minutes 5 or more days each week. Do not use any products that contain nicotine or tobacco, such as cigarettes, e-cigarettes, and chewing tobacco. If you need help quitting, ask your health care provider. Do not use drugs. If you are sexually active, practice safe sex. Use a condom or other form of protection in order to prevent STIs (sexually transmitted infections). Talk with your health care provider about taking a low-dose aspirin or statin. Find healthy ways to cope with stress, such as: Meditation, yoga, or listening to music. Journaling. Talking to a trusted person. Spending time with friends and family. Safety Always wear your seat belt while driving or riding in a vehicle. Do not drive: If you have been drinking alcohol. Do not ride with someone who has been drinking. When you are tired or distracted. While texting. Wear a helmet and other protective equipment during sports activities. If you have firearms in your house, make sure you follow all gun safety procedures. What's next? Visit your health care provider once a year for an annual wellness visit. Ask your health care provider how often you should have your eyes and teeth checked. Stay up to date on all vaccines. This information is not intended to replace advice given to you by your health care provider. Make sure you discuss any questions you have with your healthcare provider. Document Revised: 12/09/2019 Document Reviewed: 12/12/2017 Elsevier Patient Education  2022 [employer].

## 2020-06-21 NOTE — Progress Notes (Signed)
Patient ID: [name] [name], female    DOB: 1924/05/12  Age: 87 y.o. MRN: [id number]    Subjective:  Subjective  HPI [name] [name] presents for comprehensive physical exam today and follow up on management of chronic concerns. She states that she is doing well and has no recent hospitalizations or recent ER visits to report. She reports feeling right abdominal pain. She describes the pain as fleeting and sometimes sharp. She reports that the pain is prominent when sitting only, but not when stand or changing positions. She denies experiencing sweating, trouble with bowel movements, or nausea. She denies any chest pain, SOB, fever, chills, sore throat, dysuria, urinary incontinence, back pain, HA, or V/D.   She reports experiencing LE's cramps that usually happen at night every once in a while. She reports feeling the urge to cough that she describes as being dry. She states that she has issues with heartburn which might be contributing to her cough. She reports experiencing sleepless night and loss of appetite. She reports taking melatonin supplements to aid her with sleep. She reports that her recent BP readings were 150/66 and 132/62.   Review of Systems  Constitutional:  Positive for appetite change (loss of appetite) and unexpected weight change. Negative for chills, fatigue and fever.  HENT:  Negative for congestion, rhinorrhea, sinus pressure, sinus pain and sore throat.   Eyes:  Negative for pain.  Respiratory:  Positive for cough (dry). Negative for shortness of breath.   Cardiovascular:  Negative for chest pain, palpitations and leg swelling.  Gastrointestinal:  Positive for abdominal pain (right). Negative for blood in stool, diarrhea, nausea and vomiting.  Genitourinary:  Negative for decreased urine volume, dysuria, enuresis, flank pain, frequency, urgency, vaginal bleeding and vaginal discharge.  Musculoskeletal:  Negative for back pain.       (+) LE's cramps at night  Neurological:   Negative for headaches.  Psychiatric/Behavioral:  Positive for sleep disturbance.    History Past Medical History:  Diagnosis Date   Anemia    Anxiety    Arthritis    hips, knees   Current use of long term anticoagulation    Diverticulitis of colon 02/06/2015   Gait difficulty    GERD (gastroesophageal reflux disease)    Headache(784.0) 06/22/2012   Hyperlipidemia    Hypertension    IBS (irritable bowel syndrome)    Incontinence    Loss of weight 06/05/2015   Medicare annual wellness visit, subsequent 02/05/2014   Melena    Mild mitral regurgitation    Mild tricuspid regurgitation    Mitral and aortic regurgitation    Mitral valve prolapse    hx of - not seen on recent echoes   Osteopenia    Paroxysmal atrial flutter ([place])    Pedal edema 10/01/2016   Persistent atrial fibrillation (HCC)    Personal history of colonic polyps    Renal lithiasis 06/26/2015   Sinus bradycardia    Thyroid disease    hypo   Vitamin B12 deficiency 03/16/2016   Vitamin D deficiency     She has a past surgical history that includes Breast biopsy (Left); Cardiac electrophysiology mapping and ablation; Partial hysterectomy; Cataract extraction, bilateral; Esophagoscopy w/botox injection; Esophagogastroduodenoscopy (N/A, 09/03/2012); Botox injection (N/A, 09/03/2012); I & D extremity (Right, 11/06/2012); Breast lumpectomy (Right, 08/18/2014); Esophagogastroduodenoscopy (egd) with propofol (N/A, 12/02/2018); and skin cancer removal.   Her family history includes Allergies in her daughter; COPD in her daughter, daughter, and father; CVA in her mother; Diabetes in  her mother; Heart disease in her maternal aunt and maternal uncle; Rheum arthritis in her father; Stroke in her daughter, maternal grandfather, and mother.She reports that she has never smoked. She has never used smokeless tobacco. She reports that she does not drink alcohol and does not use drugs.  Current Outpatient Medications on File Prior to Visit   Medication Sig Dispense Refill   ALPRAZolam (XANAX) 0.25 MG tablet TAKE 0.5-1 TABLETS (0.125-0.25 MG TOTAL) BY MOUTH 2 (TWO) TIMES DAILY AS NEEDED FOR ANXIETY. 30 tablet 1   apixaban (ELIQUIS) 2.5 MG TABS tablet Take 1 tablet (2.5 mg total) by mouth 2 (two) times daily. 60 tablet 5   Cholecalciferol (VITAMIN D) 2000 UNITS CAPS Take 2,000 Units by mouth every other day.     famotidine (PEPCID) 20 MG tablet Take 40 mg by mouth at bedtime.      hydrALAZINE (APRESOLINE) 10 MG tablet TAKE 1 TABLET BY MOUTH THREE TIMES A DAY 270 tablet 1   isosorbide mononitrate (IMDUR) 30 MG 24 hr tablet Take 1 tablet (30 mg total) by mouth daily. 90 tablet 3   levocetirizine (XYZAL) 5 MG tablet TAKE 1/2 TABLET (2.5 MG TOTAL) BY MOUTH EVERY EVENING. 45 tablet 2   metoprolol succinate (TOPROL XL) 25 MG 24 hr tablet Take 1 tablet (25 mg total) by mouth daily. 90 tablet 1   pantoprazole (PROTONIX) 40 MG tablet TAKE 1 TABLET BY MOUTH EVERY DAY 90 tablet 1   vitamin B-12 (CYANOCOBALAMIN) 1000 MCG tablet Take 1,000 mcg by mouth daily.     No current facility-administered medications on file prior to visit.     Objective:  Objective  Physical Exam Constitutional:      General: She is not in acute distress.    Appearance: Normal appearance. She is not ill-appearing or toxic-appearing.  HENT:     Head: Normocephalic and atraumatic.     Right Ear: Tympanic membrane, ear canal and external ear normal.     Left Ear: Tympanic membrane, ear canal and external ear normal.     Nose: No congestion or rhinorrhea.  Eyes:     Extraocular Movements: Extraocular movements intact.     Right eye: No nystagmus.     Pupils: Pupils are equal, round, and reactive to light.  Cardiovascular:     Rate and Rhythm: Normal rate. Rhythm irregular.     Pulses: Normal pulses.     Heart sounds: Normal heart sounds. No murmur heard. Pulmonary:     Effort: Pulmonary effort is normal. No respiratory distress.     Breath sounds: Normal  breath sounds. No wheezing, rhonchi or rales.  Abdominal:     General: Bowel sounds are normal.     Palpations: Abdomen is soft. There is no mass.     Tenderness: There is no abdominal tenderness. There is no guarding.     Hernia: No hernia is present.  Musculoskeletal:        General: Normal range of motion.     Cervical back: Normal range of motion and neck supple.  Skin:    General: Skin is warm and dry.  Neurological:     Mental Status: She is alert and oriented to person, place, and time.     Deep Tendon Reflexes:     Reflex Scores:      Patellar reflexes are 2+ on the right side and 2+ on the left side. Psychiatric:        Behavior: Behavior normal.   BP (!) 146/64  Pulse 70   Temp 98.9 F (37.2 C)   Resp 16   Wt 90 lb 12.8 oz (41.2 kg)   SpO2 97%   BMI 18.34 kg/m  Wt Readings from Last 3 Encounters:  06/21/20 90 lb 12.8 oz (41.2 kg)  05/18/20 91 lb (41.3 kg)  02/10/20 93 lb 9.6 oz (42.5 kg)     Lab Results  Component Value Date   WBC 8.5 06/21/2020   HGB 11.0 (L) 06/21/2020   HCT 33.1 (L) 06/21/2020   PLT 254.0 06/21/2020   GLUCOSE 77 06/21/2020   CHOL 136 06/21/2020   TRIG 113.0 06/21/2020   HDL 32.20 (L) 06/21/2020   LDLDIRECT 72.1 01/19/2009   LDLCALC 81 06/21/2020   ALT 7 06/21/2020   AST 13 06/21/2020   NA 135 06/21/2020   K 4.5 06/21/2020   CL 99 06/21/2020   CREATININE 1.10 06/21/2020   BUN 18 06/21/2020   CO2 29 06/21/2020   TSH 2.85 06/21/2020   INR 2.1 04/25/2018   HGBA1C 5.6 10/29/2019    DG Chest Portable 1 View  Result Date: 11/09/2019 CLINICAL DATA:  Chest pain. EXAM: PORTABLE CHEST 1 VIEW COMPARISON:  Chest x-ray 08/30/2017 FINDINGS: The cardiac silhouette, mediastinal and hilar contours are within normal limits and stable for age. There is moderate tortuosity and calcification of the thoracic aorta. The lungs are clear of an acute process. No worrisome pulmonary lesions or pneumothorax. Moderate to large hiatal hernia again noted.  The bony thorax is intact. IMPRESSION: 1. No acute cardiopulmonary findings. 2. Moderate to large hiatal hernia. Electronically Signed   By: Marijo Sanes M.D.   On: 11/09/2019 07:30     Assessment & Plan:  Plan    Meds ordered this encounter  Medications   losartan (COZAAR) 25 MG tablet    Sig: Take 1 tablet (25 mg total) by mouth daily.    Dispense:  30 tablet    Refill:  1     Problem List Items Addressed This Visit     Essential hypertension - Primary (Chronic)   Relevant Medications   losartan (COZAAR) 25 MG tablet   Other Relevant Orders   CBC (Completed)   Comprehensive metabolic panel   Hypothyroidism    On Levothyroxine, continue to monitor       Relevant Orders   TSH (Completed)   Vitamin D deficiency    Supplement and monitor       Relevant Orders   VITAMIN D 25 Hydroxy (Vit-D Deficiency, Fractures) (Completed)   Hyperlipidemia, mixed    Encourage heart healthy diet such as MIND or DASH diet, increase exercise, avoid trans fats, simple carbohydrates and processed foods, consider a krill or fish or flaxseed oil cap daily.        Relevant Medications   losartan (COZAAR) 25 MG tablet   Other Relevant Orders   Comprehensive metabolic panel (Completed)   Lipid panel (Completed)   Anemia   Persistent atrial fibrillation (HCC)    Rate controlled       Relevant Medications   losartan (COZAAR) 25 MG tablet   Renal insufficiency    Hydrate and monitor       Hyperglycemia    hgba1c acceptable, minimize simple carbs. Increase exercise as tolerated.        Vitamin B12 deficiency    Supplement and monitor       Preventative health care    Patient encouraged to maintain heart healthy diet, regular exercise, adequate sleep. Consider daily probiotics.  Take medications as prescribed. Patient has aged out of screening such as MGM, dexa, pap and colonoscopy. Encouraged to take covid booster. Tdap due lat 2022       Muscle cramp    Nocturnal and  infrequent. Encouraged to increase hydration and check labs.        Relevant Orders   Magnesium (Completed)    Follow-up: Return 1 mn RN BP check and lab visit and 3-4 month w/MD.    [name] [name],acting as a scribe for Penni Homans, MD.,have documented all relevant documentation on the behalf of Penni Homans, MD,as directed by Penni Homans, MD while in the presence of Penni Homans, MD.   I, [name] Lukes, MD personally performed the services described in this documentation. All medical record entries made by the scribe were at my direction and in my presence. I have reviewed the chart and agree that the record reflects my personal performance and is accurate and complete

## 2020-06-22 DIAGNOSIS — R252 Cramp and spasm: Secondary | ICD-10-CM | POA: Insufficient documentation

## 2020-06-22 LAB — LIPID PANEL
Cholesterol: 136 mg/dL (ref 0–200)
HDL: 32.2 mg/dL — ABNORMAL LOW (ref >39.00)
LDL Cholesterol: 81 mg/dL (ref 0–99)
NonHDL: 103.32
Total CHOL/HDL Ratio: 4
Triglycerides: 113 mg/dL (ref 0.0–149.0)
VLDL: 22.6 mg/dL (ref 0.0–40.0)

## 2020-06-22 LAB — VITAMIN D 25 HYDROXY (VIT D DEFICIENCY, FRACTURES): VITD: 41.57 ng/mL (ref 30.00–100.00)

## 2020-06-22 LAB — CBC
HCT: 33.1 % — ABNORMAL LOW (ref 36.0–46.0)
Hemoglobin: 11 g/dL — ABNORMAL LOW (ref 12.0–15.0)
MCHC: 33.3 g/dL (ref 30.0–36.0)
MCV: 90.1 fl (ref 78.0–100.0)
Platelets: 254 10*3/uL (ref 150.0–400.0)
RBC: 3.68 Mil/uL — ABNORMAL LOW (ref 3.87–5.11)
RDW: 15.2 % (ref 11.5–15.5)
WBC: 8.5 10*3/uL (ref 4.0–10.5)

## 2020-06-22 LAB — COMPREHENSIVE METABOLIC PANEL
ALT: 7 U/L (ref 0–35)
AST: 13 U/L (ref 0–37)
Albumin: 3.5 g/dL (ref 3.5–5.2)
Alkaline Phosphatase: 15 U/L — ABNORMAL LOW (ref 39–117)
BUN: 18 mg/dL (ref 6–23)
CO2: 29 mEq/L (ref 19–32)
Calcium: 8.8 mg/dL (ref 8.4–10.5)
Chloride: 99 mEq/L (ref 96–112)
Creatinine, Ser: 1.1 mg/dL (ref 0.40–1.20)
GFR: 43.03 mL/min — ABNORMAL LOW (ref >60.00)
Glucose, Bld: 77 mg/dL (ref 70–99)
Potassium: 4.5 mEq/L (ref 3.5–5.1)
Sodium: 135 mEq/L (ref 135–145)
Total Bilirubin: 0.4 mg/dL (ref 0.2–1.2)
Total Protein: 6.8 g/dL (ref 6.0–8.3)

## 2020-06-22 LAB — TSH: TSH: 2.85 u[IU]/mL (ref 0.35–4.50)

## 2020-06-22 LAB — MAGNESIUM: Magnesium: 1.9 mg/dL (ref 1.5–2.5)

## 2020-06-22 NOTE — Assessment & Plan Note (Signed)
Supplement and monitor 

## 2020-06-22 NOTE — Assessment & Plan Note (Signed)
Rate controlled 

## 2020-06-22 NOTE — Assessment & Plan Note (Signed)
hgba1c acceptable, minimize simple carbs. Increase exercise as tolerated.  

## 2020-06-22 NOTE — Assessment & Plan Note (Signed)
Nocturnal and infrequent. Encouraged to increase hydration and check labs.

## 2020-06-22 NOTE — Assessment & Plan Note (Signed)
Hydrate and monitor 

## 2020-06-22 NOTE — Assessment & Plan Note (Signed)
Patient encouraged to maintain heart healthy diet, regular exercise, adequate sleep. Consider daily probiotics. Take medications as prescribed. Patient has aged out of screening such as MGM, dexa, pap and colonoscopy. Encouraged to take covid booster. Tdap due lat 2022

## 2020-06-22 NOTE — Assessment & Plan Note (Signed)
On Levothyroxine, continue to monitor 

## 2020-06-22 NOTE — Assessment & Plan Note (Signed)
Encourage heart healthy diet such as MIND or DASH diet, increase exercise, avoid trans fats, simple carbohydrates and processed foods, consider a krill or fish or flaxseed oil cap daily.  °

## 2020-07-07 ENCOUNTER — Other Ambulatory Visit: Payer: Self-pay | Admitting: Family Medicine

## 2020-07-07 ENCOUNTER — Telehealth: Payer: Self-pay

## 2020-07-07 NOTE — Telephone Encounter (Signed)
Patient's daughter Izora Gala called stating mother was prescribed Losartan 25 mg daily 06/21/2020. Since starting she states she has not been feeling well. She states she has not felt like herself and "strange like" . No dizziness but feels off" I asked for her to specify what symptoms she is having for her to feel off. She couldn't specify. She has since stopped taking losartan and some symptoms have improved.   I asked what BP is reading at home, however her machine is broken and unable to record BP. She is wondering what to do from here?  She was advised to stop taking the hydralazine 10 mg when starting the losartan. She would like to know if her mother is to continue losartan or restart hydralazine.   Patient's daughter Izora Gala callback is [phone]

## 2020-07-08 ENCOUNTER — Encounter: Payer: Self-pay | Admitting: *Deleted

## 2020-07-08 ENCOUNTER — Other Ambulatory Visit: Payer: Self-pay

## 2020-07-08 DIAGNOSIS — I10 Essential (primary) hypertension: Secondary | ICD-10-CM

## 2020-07-08 NOTE — Telephone Encounter (Addendum)
Daughter stated that she had already been contacted.  Appointment was made for nurse visit but not lab.  Appointment made for 11am for lab on same day as nurse visit.  Losartan d/c on medication list.

## 2020-07-14 ENCOUNTER — Other Ambulatory Visit: Payer: Self-pay | Admitting: Family Medicine

## 2020-07-15 ENCOUNTER — Ambulatory Visit (INDEPENDENT_AMBULATORY_CARE_PROVIDER_SITE_OTHER): Payer: Medicare Other | Admitting: Family Medicine

## 2020-07-15 ENCOUNTER — Other Ambulatory Visit: Payer: Self-pay

## 2020-07-15 ENCOUNTER — Other Ambulatory Visit (INDEPENDENT_AMBULATORY_CARE_PROVIDER_SITE_OTHER): Payer: Medicare Other

## 2020-07-15 VITALS — BP 130/69 | HR 80

## 2020-07-15 DIAGNOSIS — I10 Essential (primary) hypertension: Secondary | ICD-10-CM

## 2020-07-15 LAB — COMPREHENSIVE METABOLIC PANEL
ALT: 7 U/L (ref 0–35)
AST: 14 U/L (ref 0–37)
Albumin: 3.4 g/dL — ABNORMAL LOW (ref 3.5–5.2)
Alkaline Phosphatase: 12 U/L — ABNORMAL LOW (ref 39–117)
BUN: 23 mg/dL (ref 6–23)
CO2: 29 mEq/L (ref 19–32)
Calcium: 9.4 mg/dL (ref 8.4–10.5)
Chloride: 99 mEq/L (ref 96–112)
Creatinine, Ser: 1.23 mg/dL — ABNORMAL HIGH (ref 0.40–1.20)
GFR: 37.62 mL/min — ABNORMAL LOW (ref >60.00)
Glucose, Bld: 117 mg/dL — ABNORMAL HIGH (ref 70–99)
Potassium: 4.6 mEq/L (ref 3.5–5.1)
Sodium: 136 mEq/L (ref 135–145)
Total Bilirubin: 0.4 mg/dL (ref 0.2–1.2)
Total Protein: 7 g/dL (ref 6.0–8.3)

## 2020-07-15 NOTE — Progress Notes (Signed)
Pt here for Blood pressure check per PCP   Pt currently takes: hydralazine 10mg  tid, metoprolol succianate 25mg  qd   Pt reports compliance with medication.  BP Readings from Last 3 Encounters:  06/21/20 (!) 146/64  05/18/20 (!) 156/80  02/10/20 120/60   Patient only checked it once at home but remembers reading was in normal range.  Has taken medication today.  Daughter stated that she has been since she had to issue with losartan.  Losartan was stopped around 07/07/20.  BP today @ = 130/69 HR = 80  Pt advised per Dr. Nani Ravens to continue same regimen and check blood pressures about 2-3 times a week and follow up in October.    Patient will call with status in about 3 weeks.

## 2020-07-21 ENCOUNTER — Ambulatory Visit: Payer: Medicare Other

## 2020-08-08 ENCOUNTER — Other Ambulatory Visit: Payer: Self-pay

## 2020-08-08 MED ORDER — METOPROLOL SUCCINATE ER 25 MG PO TB24
25.0000 mg | ORAL_TABLET | Freq: Every day | ORAL | 1 refills | Status: DC
Start: 2020-08-08 — End: 2021-01-30

## 2020-08-17 ENCOUNTER — Encounter (HOSPITAL_BASED_OUTPATIENT_CLINIC_OR_DEPARTMENT_OTHER): Payer: Self-pay

## 2020-08-17 ENCOUNTER — Emergency Department (HOSPITAL_BASED_OUTPATIENT_CLINIC_OR_DEPARTMENT_OTHER): Payer: Medicare Other

## 2020-08-17 ENCOUNTER — Ambulatory Visit (INDEPENDENT_AMBULATORY_CARE_PROVIDER_SITE_OTHER): Payer: Medicare Other | Admitting: Family

## 2020-08-17 ENCOUNTER — Other Ambulatory Visit: Payer: Self-pay

## 2020-08-17 ENCOUNTER — Emergency Department (HOSPITAL_BASED_OUTPATIENT_CLINIC_OR_DEPARTMENT_OTHER)
Admission: EM | Admit: 2020-08-17 | Discharge: 2020-08-17 | Disposition: A | Discharge status: Home / Self Care | Payer: Medicare Other | Attending: Emergency Medicine | Admitting: Emergency Medicine

## 2020-08-17 VITALS — BP 160/63 | HR 106 | Temp 98.5°F | Resp 16 | Ht 59.0 in | Wt 87.2 lb

## 2020-08-17 DIAGNOSIS — Z79899 Other long term (current) drug therapy: Secondary | ICD-10-CM | POA: Diagnosis not present

## 2020-08-17 DIAGNOSIS — I10 Essential (primary) hypertension: Secondary | ICD-10-CM | POA: Insufficient documentation

## 2020-08-17 DIAGNOSIS — E039 Hypothyroidism, unspecified: Secondary | ICD-10-CM | POA: Diagnosis not present

## 2020-08-17 DIAGNOSIS — Z7901 Long term (current) use of anticoagulants: Secondary | ICD-10-CM | POA: Diagnosis not present

## 2020-08-17 DIAGNOSIS — Z20822 Contact with and (suspected) exposure to covid-19: Secondary | ICD-10-CM | POA: Diagnosis not present

## 2020-08-17 DIAGNOSIS — K529 Noninfective gastroenteritis and colitis, unspecified: Secondary | ICD-10-CM | POA: Insufficient documentation

## 2020-08-17 DIAGNOSIS — R197 Diarrhea, unspecified: Secondary | ICD-10-CM | POA: Insufficient documentation

## 2020-08-17 DIAGNOSIS — N281 Cyst of kidney, acquired: Secondary | ICD-10-CM | POA: Diagnosis not present

## 2020-08-17 DIAGNOSIS — R000 Tachycardia, unspecified: Secondary | ICD-10-CM | POA: Diagnosis not present

## 2020-08-17 DIAGNOSIS — K225 Diverticulum of esophagus, acquired: Secondary | ICD-10-CM | POA: Diagnosis not present

## 2020-08-17 DIAGNOSIS — K5730 Diverticulosis of large intestine without perforation or abscess without bleeding: Secondary | ICD-10-CM | POA: Diagnosis not present

## 2020-08-17 LAB — COMPREHENSIVE METABOLIC PANEL
ALT: 15 U/L (ref 0–44)
AST: 22 U/L (ref 15–41)
Albumin: 3.2 g/dL — ABNORMAL LOW (ref 3.5–5.0)
Alkaline Phosphatase: 16 U/L — ABNORMAL LOW (ref 38–126)
Anion gap: 11 (ref 5–15)
BUN: 16 mg/dL (ref 8–23)
CO2: 24 mmol/L (ref 22–32)
Calcium: 8.5 mg/dL — ABNORMAL LOW (ref 8.9–10.3)
Chloride: 101 mmol/L (ref 98–111)
Creatinine, Ser: 0.94 mg/dL (ref 0.44–1.00)
GFR, Estimated: 56 mL/min — ABNORMAL LOW (ref >60)
Glucose, Bld: 92 mg/dL (ref 70–99)
Potassium: 3.6 mmol/L (ref 3.5–5.1)
Sodium: 136 mmol/L (ref 135–145)
Total Bilirubin: 0.5 mg/dL (ref 0.3–1.2)
Total Protein: 7.1 g/dL (ref 6.5–8.1)

## 2020-08-17 LAB — CBC WITH DIFFERENTIAL/PLATELET
Abs Immature Granulocytes: 0.03 10*3/uL (ref 0.00–0.07)
Basophils Absolute: 0 10*3/uL (ref 0.0–0.1)
Basophils Relative: 0 %
Eosinophils Absolute: 0 10*3/uL (ref 0.0–0.5)
Eosinophils Relative: 0 %
HCT: 36 % (ref 36.0–46.0)
Hemoglobin: 11.9 g/dL — ABNORMAL LOW (ref 12.0–15.0)
Immature Granulocytes: 0 %
Lymphocytes Relative: 22 %
Lymphs Abs: 1.5 10*3/uL (ref 0.7–4.0)
MCH: 29.8 pg (ref 26.0–34.0)
MCHC: 33.1 g/dL (ref 30.0–36.0)
MCV: 90.2 fL (ref 80.0–100.0)
Monocytes Absolute: 0.3 10*3/uL (ref 0.1–1.0)
Monocytes Relative: 5 %
Neutro Abs: 4.9 10*3/uL (ref 1.7–7.7)
Neutrophils Relative %: 73 %
Platelets: 236 10*3/uL (ref 150–400)
RBC: 3.99 MIL/uL (ref 3.87–5.11)
RDW: 16.4 % — ABNORMAL HIGH (ref 11.5–15.5)
WBC: 6.8 10*3/uL (ref 4.0–10.5)
nRBC: 0 % (ref 0.0–0.2)

## 2020-08-17 LAB — RESP PANEL BY RT-PCR (FLU A&B, COVID) ARPGX2
Influenza A by PCR: NEGATIVE
Influenza B by PCR: NEGATIVE
SARS Coronavirus 2 by RT PCR: NEGATIVE

## 2020-08-17 LAB — LIPASE, BLOOD: Lipase: 26 U/L (ref 11–51)

## 2020-08-17 MED ORDER — METRONIDAZOLE 500 MG PO TABS: 500.0000 mg | ORAL_TABLET | Freq: Two times a day (BID) | ORAL | 0 refills | Status: DC

## 2020-08-17 MED ORDER — LEVOCETIRIZINE DIHYDROCHLORIDE 5 MG PO TABS: ORAL_TABLET | ORAL | 2 refills | Status: DC

## 2020-08-17 MED ORDER — CIPROFLOXACIN HCL 250 MG PO TABS: 250.0000 mg | ORAL_TABLET | Freq: Two times a day (BID) | ORAL | 0 refills | Status: AC

## 2020-08-17 MED ORDER — IOHEXOL 300 MG/ML  SOLN
75.0000 mL | Freq: Once | INTRAMUSCULAR | Status: AC | PRN
  Administered 2020-08-17: 75 mL via INTRAVENOUS

## 2020-08-17 MED ORDER — SODIUM CHLORIDE 0.9 % IV BOLUS
500.0000 mL | Freq: Once | INTRAVENOUS | Status: AC
  Administered 2020-08-17: 500 mL via INTRAVENOUS

## 2020-08-17 NOTE — Patient Instructions (Signed)
Please go to the ER on the first floor for further evaluation.

## 2020-08-17 NOTE — ED Notes (Signed)
Pt continues to be unable to have BM

## 2020-08-17 NOTE — ED Triage Notes (Signed)
Pt arrives from PCP today with c/o diarrhea X6 days unknown reason why, denies any NV, denies any blood in stool. Pt ambulatory to triage with rolling walker.

## 2020-08-17 NOTE — Progress Notes (Signed)
Subjective:   By signing my name below, I, Shehryar Baig, attest that this documentation has been prepared under the direction and in the presence of Debbrah Alar NP. 08/17/2020    Patient ID: [name] [name], female    DOB: Nov 12, 1924, 87 y.o.   MRN: [id number]  Chief Complaint  Patient presents with   Diarrhea    Complains of diarrhea since Thursday morning    HPI Patient is in today for a office visit. She is present with her daughter during this visit to assist with transportation and communication.  She complains of diarrhea which began last Thursday 08/11/2020. She reports that her diarrhea has worsened since its onset. She notes the last 2 days were the worst her symptoms have been with today she has 3 bowel movements and yesterday she had 4 bowel movements. She denies any blood in the stool or abdominal pain. She has not taken any antibiotics in the last 2 months. She does not have anybody with similar symptoms around her. She reports that she has been trying to drink some broth but she and the daughter don't believe that she is keeping up with her fluid needs.   Health Maintenance Due  Topic Date Due   Zoster Vaccines- Shingrix (1 of 2) Never done   COVID-19 Vaccine (3 - Booster for [employer] series) 08/04/2019   INFLUENZA VACCINE  08/01/2020    Past Medical History:  Diagnosis Date   Anemia    Anxiety    Arthritis    hips, knees   Current use of long term anticoagulation    Diverticulitis of colon 02/06/2015   Gait difficulty    GERD (gastroesophageal reflux disease)    Headache(784.0) 06/22/2012   Hyperlipidemia    Hypertension    IBS (irritable bowel syndrome)    Incontinence    Loss of weight 06/05/2015   Medicare annual wellness visit, subsequent 02/05/2014   Melena    Mild mitral regurgitation    Mild tricuspid regurgitation    Mitral and aortic regurgitation    Mitral valve prolapse    hx of - not seen on recent echoes   Osteopenia    Paroxysmal atrial  flutter ([place])    Pedal edema 10/01/2016   Persistent atrial fibrillation (HCC)    Personal history of colonic polyps    Renal lithiasis 06/26/2015   Sinus bradycardia    Thyroid disease    hypo   Vitamin B12 deficiency 03/16/2016   Vitamin D deficiency     Past Surgical History:  Procedure Laterality Date   BOTOX INJECTION N/A 09/03/2012   Procedure: BOTOX INJECTION;  Surgeon: Inda Castle, MD;  Location: [facility];  Service: Endoscopy;  Laterality: N/A;   BREAST BIOPSY Left    BREAST LUMPECTOMY Right 08/18/2014   Procedure: RIGHT BREAST LUMPECTOMY;  Surgeon: Coralie Keens, MD;  Location: [place];  Service: General;  Laterality: Right;   CARDIAC ELECTROPHYSIOLOGY MAPPING AND ABLATION     CATARACT EXTRACTION, BILATERAL     ESOPHAGOGASTRODUODENOSCOPY N/A 09/03/2012   Procedure: ESOPHAGOGASTRODUODENOSCOPY (EGD);  Surgeon: Inda Castle, MD;  Location: [facility];  Service: Endoscopy;  Laterality: N/A;   ESOPHAGOGASTRODUODENOSCOPY (EGD) WITH PROPOFOL N/A 12/02/2018   Procedure: ESOPHAGOGASTRODUODENOSCOPY (EGD) WITH PROPOFOL;  Surgeon: [name], MD;  Location: [facility];  Service: Gastroenterology;  Laterality: N/A;   ESOPHAGOSCOPY W/ BOTOX INJECTION     I & D EXTREMITY Right 11/06/2012   Procedure: IRRIGATION AND DEBRIDEMENT EXTREMITY Right Ring Finger;  Surgeon: Tennis Must, MD;  Location: [place];  Service: Orthopedics;  Laterality: Right;   PARTIAL HYSTERECTOMY     ovaries left in place   skin cancer removal      Family History  Problem Relation Age of Onset   Diabetes Mother    CVA Mother    Stroke Mother    COPD Father    Rheum arthritis Father    Allergies Daughter    COPD Daughter    Stroke Daughter    COPD Daughter        previous smoker   Stroke Maternal Grandfather    Heart disease Maternal Aunt    Heart disease Maternal Uncle    Colon cancer Neg Hx    Colon polyps Neg Hx    Esophageal cancer Neg Hx    Gallbladder disease Neg  Hx    Kidney disease Neg Hx    Heart attack Neg Hx    Hypertension Neg Hx     Social History   Socioeconomic History   Marital status: Widowed    Spouse name: Not on file   Number of children: 5   Years of education: Not on file   Highest education level: Not on file  Occupational History   Occupation: retired    Fish farm manager: RETIRED  Tobacco Use   Smoking status: Never   Smokeless tobacco: Never  Vaping Use   Vaping Use: Never used  Substance and Sexual Activity   Alcohol use: No   Drug use: No   Sexual activity: Not on file    Comment: lives with Daughter, grandson and his family. no dietary restricitons.  Other Topics Concern   Not on file  Social History Narrative   Not on file   Social Determinants of Health   Financial Resource Strain: Not on file  Food Insecurity: Not on file  Transportation Needs: Not on file  Physical Activity: Not on file  Stress: Not on file  Social Connections: Not on file  Intimate Partner Violence: Not on file    Outpatient Medications Prior to Visit  Medication Sig Dispense Refill   ALPRAZolam (XANAX) 0.25 MG tablet TAKE 0.5-1 TABLETS (0.125-0.25 MG TOTAL) BY MOUTH 2 (TWO) TIMES DAILY AS NEEDED FOR ANXIETY. 30 tablet 1   apixaban (ELIQUIS) 2.5 MG TABS tablet Take 1 tablet (2.5 mg total) by mouth 2 (two) times daily. 60 tablet 5   Cholecalciferol (VITAMIN D) 2000 UNITS CAPS Take 2,000 Units by mouth every other day.     famotidine (PEPCID) 20 MG tablet Take 40 mg by mouth at bedtime.      hydrALAZINE (APRESOLINE) 10 MG tablet TAKE 1 TABLET BY MOUTH THREE TIMES A DAY 270 tablet 1   isosorbide mononitrate (IMDUR) 30 MG 24 hr tablet Take 1 tablet (30 mg total) by mouth daily. 90 tablet 3   metoprolol succinate (TOPROL XL) 25 MG 24 hr tablet Take 1 tablet (25 mg total) by mouth daily. 90 tablet 1   pantoprazole (PROTONIX) 40 MG tablet TAKE 1 TABLET BY MOUTH EVERY DAY 90 tablet 1   vitamin B-12 (CYANOCOBALAMIN) 1000 MCG tablet Take 1,000 mcg  by mouth daily.     levocetirizine (XYZAL) 5 MG tablet TAKE 1/2 TABLET (2.5 MG TOTAL) BY MOUTH EVERY EVENING. 45 tablet 2   No facility-administered medications prior to visit.    Allergies  Allergen Reactions   Amlodipine Shortness Of Breath   Darifenacin Hydrobromide Other (See Comments)    dizziness   Other  Other (See Comments)    dizziness   Sulfonamide Derivatives Other (See Comments)    dizziness   Tramadol Other (See Comments)    Insomnia, anorexia    Review of Systems  Gastrointestinal:  Positive for diarrhea. Negative for abdominal pain and blood in stool.      Objective:    Physical Exam Constitutional:      General: She is not in acute distress.    Appearance: Normal appearance. She is not ill-appearing.  HENT:     Head: Normocephalic and atraumatic.     Right Ear: External ear normal.     Left Ear: External ear normal.     Mouth/Throat:     Mouth: Mucous membranes are dry.     Comments: Dry mucosa  Eyes:     Extraocular Movements: Extraocular movements intact.     Pupils: Pupils are equal, round, and reactive to light.  Cardiovascular:     Rate and Rhythm: Normal rate and regular rhythm.     Heart sounds: Normal heart sounds. No murmur heard.   No gallop.  Pulmonary:     Effort: Pulmonary effort is normal. No respiratory distress.     Breath sounds: Normal breath sounds. No wheezing or rales.  Abdominal:     Palpations: Abdomen is soft.     Tenderness: There is no abdominal tenderness. There is no guarding or rebound.  Skin:    General: Skin is warm and dry.  Neurological:     Mental Status: She is alert and oriented to person, place, and time.  Psychiatric:        Behavior: Behavior normal.    BP (!) 160/63 (BP Location: Right Arm, Patient Position: Sitting, Cuff Size: Small)   Pulse (!) 106   Temp 98.5 F (36.9 C) (Oral)   Resp 16   Ht '4\' 11"'$  (1.499 m)   Wt 87 lb 3.2 oz (39.6 kg)   SpO2 98%   BMI 17.61 kg/m  Wt Readings from Last 3  Encounters:  08/17/20 87 lb 3.2 oz (39.6 kg)  06/21/20 90 lb 12.8 oz (41.2 kg)  05/18/20 91 lb (41.3 kg)       Assessment & Plan:   Problem List Items Addressed This Visit       Unprioritized   Diarrhea - Primary    Given her advanced age and poor PO intake, I am concerned about progressive dehydration. I have advised patient and her daughter that I think it would be best for her to be evaluated in the ER downstairs where they can do additional lab work and IV fluids. They are agreeable and were brought down by CMA.  MD and Charge nurse were unavailable, so report was given to [place] at the [facility] ED.        23 minutes spent on today's visit. Time was spent interviewing/examining pt and coordinating care.  Meds ordered this encounter  Medications   levocetirizine (XYZAL) 5 MG tablet    Sig: TAKE 1/2 TABLET (2.5 MG TOTAL) BY MOUTH EVERY EVENING.    Dispense:  45 tablet    Refill:  2    I, Debbrah Alar NP, personally preformed the services described in this documentation.  All medical record entries made by the scribe were at my direction and in my presence.  I have reviewed the chart and discharge instructions (if applicable) and agree that the record reflects my personal performance and is accurate and complete. 08/17/2020   I,Shehryar Baig,acting as  a scribe for Nance Pear, NP.,have documented all relevant documentation on the behalf of Nance Pear, NP,as directed by  Nance Pear, NP while in the presence of Nance Pear, NP.   Nance Pear, NP

## 2020-08-17 NOTE — ED Provider Notes (Signed)
[name] EMERGENCY DEPARTMENT Provider Note   CSN: [id number] Arrival date & time: 08/17/20  1005     History Chief Complaint  Patient presents with   Diarrhea    [name] [name] is a 87 y.o. female.  HPI 87 year old female presents with diarrhea and weakness.  This has been ongoing for about 6 days.  Up to 5 or so stools per day, or watery and nonbloody.  No vomiting.  Recently she is developed some upper abdominal pain, especially with sitting.  Is not that bad but constant.  Feels a little weak and lightheaded but not dizzy.  No recent antibiotics or travel.  Past Medical History:  Diagnosis Date   Anemia    Anxiety    Arthritis    hips, knees   Current use of long term anticoagulation    Diverticulitis of colon 02/06/2015   Gait difficulty    GERD (gastroesophageal reflux disease)    Headache(784.0) 06/22/2012   Hyperlipidemia    Hypertension    IBS (irritable bowel syndrome)    Incontinence    Loss of weight 06/05/2015   Medicare annual wellness visit, subsequent 02/05/2014   Melena    Mild mitral regurgitation    Mild tricuspid regurgitation    Mitral and aortic regurgitation    Mitral valve prolapse    hx of - not seen on recent echoes   Osteopenia    Paroxysmal atrial flutter ([place])    Pedal edema 10/01/2016   Persistent atrial fibrillation (HCC)    Personal history of colonic polyps    Renal lithiasis 06/26/2015   Sinus bradycardia    Thyroid disease    hypo   Vitamin B12 deficiency 03/16/2016   Vitamin D deficiency     Patient Active Problem List   Diagnosis Date Noted   Diarrhea 08/17/2020   Muscle cramp 06/22/2020   Chest pain of uncertain etiology 0000000   Malnutrition ([place]) 11/09/2019   PAF (paroxysmal atrial fibrillation) (HCC)    Esophageal dysphagia 10/29/2018   Cough 09/02/2017   Hyponatremia 06/29/2017   Bradycardia 06/29/2017   Chest pain 12/10/2016   Pedal edema 10/01/2016   Preventative health care 03/18/2016   Vitamin  B12 deficiency 03/16/2016   Renal lithiasis 06/26/2015   Loss of weight 06/05/2015   Diverticulitis of colon 02/06/2015   Right-sided thoracic back pain 11/15/2014   Nipple discharge 06/21/2014   Rib pain on right side 04/29/2014   Medicare annual wellness visit, subsequent 02/05/2014   Abdominal pain, chronic, right lower quadrant 02/03/2014   Rectal bleeding 12/03/2013   Prolapse of female pelvic organs 12/03/2013   Leukocytes in urine 11/25/2013   Abnormal chest x-ray 10/23/2013   Pulmonary hypertension ([place]) 10/09/2013   Tricuspid regurgitation 10/09/2013   Chronic anticoagulation 10/09/2013   Hyperglycemia 08/24/2013   Encounter for therapeutic drug monitoring 03/13/2013   Renal insufficiency 06/28/2012   Hyperkalemia 06/28/2012   Headache 06/22/2012   Sinus bradycardia 09/20/2011   Urinary frequency 02/13/2011   Arthritis 11/13/2010   Long term (current) use of anticoagulants 03/31/2010   DIZZINESS 08/03/2009   Achalasia 05/18/2009   DYSPHAGIA UNSPECIFIED 04/11/2009   Anemia 01/19/2009   ESOPHAGEAL STRICTURE 01/19/2009   Anxiety state 09/15/2008   MITRAL REGURGITATION, MILD 08/20/2008   Persistent atrial fibrillation ([place]) 08/20/2008   MITRAL VALVE PROLAPSE, HX OF 08/20/2008   Vitamin D deficiency 01/15/2008   IBS 02/03/2007   Hypothyroidism 01/29/2007   Hyperlipidemia, mixed 01/29/2007   Essential hypertension 01/29/2007   GERD 01/29/2007  Disorder of bone and cartilage 01/29/2007   COLONIC POLYPS, HX OF 01/29/2007    Past Surgical History:  Procedure Laterality Date   BOTOX INJECTION N/A 09/03/2012   Procedure: BOTOX INJECTION;  Surgeon: Inda Castle, MD;  [facility]: [facility];  Service: Endoscopy;  Laterality: N/A;   BREAST BIOPSY Left    BREAST LUMPECTOMY Right 08/18/2014   Procedure: RIGHT BREAST LUMPECTOMY;  Surgeon: Coralie Keens, MD;  Location: [facility];  Service: General;  Laterality: Right;   CARDIAC ELECTROPHYSIOLOGY MAPPING  AND ABLATION     CATARACT EXTRACTION, BILATERAL     ESOPHAGOGASTRODUODENOSCOPY N/A 09/03/2012   Procedure: ESOPHAGOGASTRODUODENOSCOPY (EGD);  Surgeon: Inda Castle, MD;  Location: [facility];  Service: Endoscopy;  Laterality: N/A;   ESOPHAGOGASTRODUODENOSCOPY (EGD) WITH PROPOFOL N/A 12/02/2018   Procedure: ESOPHAGOGASTRODUODENOSCOPY (EGD) WITH PROPOFOL;  Surgeon: [name], MD;  [facility];  Service: Gastroenterology;  Laterality: N/A;   ESOPHAGOSCOPY W/ BOTOX INJECTION     I & D EXTREMITY Right 11/06/2012   Procedure: IRRIGATION AND DEBRIDEMENT EXTREMITY Right Ring Finger;  Surgeon: Tennis Must, MD;  Location: [place];  Service: Orthopedics;  Laterality: Right;   PARTIAL HYSTERECTOMY     ovaries left in place   skin cancer removal       OB History   No obstetric history on file.     Family History  Problem Relation Age of Onset   Diabetes Mother    CVA Mother    Stroke Mother    COPD Father    Rheum arthritis Father    Allergies Daughter    COPD Daughter    Stroke Daughter    COPD Daughter        previous smoker   Stroke Maternal Grandfather    Heart disease Maternal Aunt    Heart disease Maternal Uncle    Colon cancer Neg Hx    Colon polyps Neg Hx    Esophageal cancer Neg Hx    Gallbladder disease Neg Hx    Kidney disease Neg Hx    Heart attack Neg Hx    Hypertension Neg Hx     Social History   Tobacco Use   Smoking status: Never   Smokeless tobacco: Never  Vaping Use   Vaping Use: Never used  Substance Use Topics   Alcohol use: No   Drug use: No    Home Medications Prior to Admission medications   Medication Sig Start Date End Date Taking? Authorizing Provider  ciprofloxacin (CIPRO) 250 MG tablet Take 1 tablet (250 mg total) by mouth 2 (two) times daily for 7 days. One po bid x 7 days 08/17/20 08/24/20 Yes Sherwood Gambler, MD  metroNIDAZOLE (FLAGYL) 500 MG tablet Take 1 tablet (500 mg total) by mouth 2 (two) times daily. One po bid x 7  days 08/17/20  Yes Sherwood Gambler, MD  ALPRAZolam (XANAX) 0.25 MG tablet TAKE 0.5-1 TABLETS (0.125-0.25 MG TOTAL) BY MOUTH 2 (TWO) TIMES DAILY AS NEEDED FOR ANXIETY. 02/29/20   Mosie Lukes, MD  apixaban (ELIQUIS) 2.5 MG TABS tablet Take 1 tablet (2.5 mg total) by mouth 2 (two) times daily. 04/25/20   Imogene Burn, PA-C  Cholecalciferol (VITAMIN D) 2000 UNITS CAPS Take 2,000 Units by mouth every other day. 02/20/12   Mosie Lukes, MD  famotidine (PEPCID) 20 MG tablet Take 40 mg by mouth at bedtime.     [provider]  hydrALAZINE (APRESOLINE) 10 MG tablet TAKE 1 TABLET  BY MOUTH THREE TIMES A DAY 04/25/20   Mosie Lukes, MD  isosorbide mononitrate (IMDUR) 30 MG 24 hr tablet Take 1 tablet (30 mg total) by mouth daily. 05/23/20   [name], MD  levocetirizine (XYZAL) 5 MG tablet TAKE 1/2 TABLET (2.5 MG TOTAL) BY MOUTH EVERY EVENING. 08/17/20   Debbrah Alar, NP  metoprolol succinate (TOPROL XL) 25 MG 24 hr tablet Take 1 tablet (25 mg total) by mouth daily. 08/08/20   [name], MD  pantoprazole (PROTONIX) 40 MG tablet TAKE 1 TABLET BY MOUTH EVERY DAY 03/15/20   Mosie Lukes, MD  vitamin B-12 (CYANOCOBALAMIN) 1000 MCG tablet Take 1,000 mcg by mouth daily.    [provider]    Allergies    Amlodipine, Darifenacin hydrobromide, Other, Sulfonamide derivatives, and Tramadol  Review of Systems   Review of Systems  Constitutional:  Negative for fever.  Gastrointestinal:  Positive for abdominal pain and diarrhea. Negative for blood in stool and vomiting.  Neurological:  Positive for weakness and light-headedness.  All other systems reviewed and are negative.  Physical Exam Updated Vital Signs BP (!) 165/76   Pulse 71   Temp 98.4 F (36.9 C) (Oral)   Resp 19   Ht '4\' 11"'$  (1.499 m)   Wt 39.6 kg   SpO2 96%   BMI 17.61 kg/m   Physical Exam Vitals and nursing note reviewed.  Constitutional:      Appearance: She is well-developed.  HENT:      Head: Normocephalic and atraumatic.     Right Ear: External ear normal.     Left Ear: External ear normal.     Nose: Nose normal.  Eyes:     General:        Right eye: No discharge.        Left eye: No discharge.  Cardiovascular:     Rate and Rhythm: Regular rhythm. Tachycardia present.     Heart sounds: Normal heart sounds.     Comments: HR low 100s Pulmonary:     Effort: Pulmonary effort is normal.     Breath sounds: Normal breath sounds.  Abdominal:     Palpations: Abdomen is soft.     Tenderness: There is abdominal tenderness in the right upper quadrant, epigastric area and left upper quadrant.  Skin:    General: Skin is warm and dry.  Neurological:     Mental Status: She is alert.  Psychiatric:        Mood and Affect: Mood is not anxious.    ED Results / Procedures / Treatments   Labs (all labs ordered are listed, but only abnormal results are displayed) Labs Reviewed  COMPREHENSIVE METABOLIC PANEL - Abnormal; Notable for the following components:      Result Value   Calcium 8.5 (*)    Albumin 3.2 (*)    Alkaline Phosphatase 16 (*)    GFR, Estimated 56 (*)    All other components within normal limits  CBC WITH DIFFERENTIAL/PLATELET - Abnormal; Notable for the following components:   Hemoglobin 11.9 (*)    RDW 16.4 (*)    All other components within normal limits  RESP PANEL BY RT-PCR (FLU A&B, COVID) [id number]  GASTROINTESTINAL PANEL BY PCR, STOOL (REPLACES STOOL CULTURE)  C DIFFICILE QUICK SCREEN W PCR REFLEX    LIPASE, BLOOD    EKG EKG Interpretation  Date/Time:  Wednesday August 17 2020 10:23:03 EDT Ventricular Rate:  105 PR Interval:    QRS Duration:  90 QT Interval:  326 QTC Calculation: 430 R Axis:   18 Text Interpretation: Sinus tachycardia Left ventricular hypertrophy with repolarization abnormality ( Sokolow-Lyon ) Cannot rule out Septal infarct , age undetermined Abnormal ECG Confirmed by Sherwood Gambler [phone]) on 08/17/2020 10:46:29  AM  Radiology CT ABDOMEN PELVIS W CONTRAST  Result Date: 08/17/2020 CLINICAL DATA:  Left upper quadrant pain and diarrhea for 6 days. EXAM: CT ABDOMEN AND PELVIS WITH CONTRAST TECHNIQUE: Multidetector CT imaging of the abdomen and pelvis was performed using the standard protocol following bolus administration of intravenous contrast. CONTRAST:  54m OMNIPAQUE IOHEXOL 300 MG/ML  SOLN COMPARISON:  06/20/2017 FINDINGS: Lower Chest: A well-circumscribed cystic lesion is seen in the lower middle mediastinum, which contains fluid and particulate matter and is contiguous with the lower thoracic esophagus. This measures 6.6 x 6.4 cm and shows no significant change since previous study. This is consistent with a large esophageal diverticulum. No evidence of hiatal hernia. Hepatobiliary: No hepatic masses identified. Gallbladder is unremarkable. No evidence of biliary ductal dilatation. Pancreas:  No mass or inflammatory changes. Spleen: Within normal limits in size. Small low-attenuation splenic lesion is stable and consistent with benign etiology. Adrenals/Urinary Tract: No masses identified. A few tiny sub-cm renal cysts are noted bilaterally. No evidence of ureteral calculi or hydronephrosis. Unremarkable unopacified urinary bladder. Stomach/Bowel: Diverticulosis is again seen involving the sigmoid and ascending colon, however there is no evidence of diverticulitis. Mild diffuse colonic wall thickening is noted, suspicious for mild diffuse colitis. No evidence of small bowel wall thickening or dilatation. No evidence of abscess or ascites. Vascular/Lymphatic: No pathologically enlarged lymph nodes. No acute vascular findings. Aortic atherosclerotic calcification noted. Reproductive: Prior hysterectomy noted. Adnexal regions are unremarkable in appearance. Other:  None. Musculoskeletal:  No suspicious bone lesions identified. IMPRESSION: Findings suspicious for mild diffuse colitis. No evidence of abscess or other  complication. Colonic diverticulosis, without radiographic evidence of diverticulitis. Stable large diverticulum arising from the lower thoracic esophagus. Aortic Atherosclerosis (ICD10-I70.0). Electronically Signed   By: JMarlaine HindM.D.   On: 08/17/2020 15:04    Procedures Procedures   Medications Ordered in ED Medications  sodium chloride 0.9 % bolus 500 mL ( Intravenous Stopped 08/17/20 1350)  iohexol (OMNIPAQUE) 300 MG/ML solution 75 mL (75 mLs Intravenous Contrast Given 08/17/20 1410)    ED Course  I have reviewed the triage vital signs and the nursing notes.  Pertinent labs & imaging results that were available during my care of the patient were reviewed by me and considered in my medical decision making (see chart for details).    MDM Rules/Calculators/A&P                           Patient is well appearing. Given abdominal discomfort (though mild), CT obtained and does show colitis. This appears to be mild, and she has benign lab work and vital signs (besides HTN). I think she is stable for outpatient treatment, and given colitis findings will put on antibiotics and advise close PCP follow up. Given return precautions.  Final Clinical Impression(s) / ED Diagnoses Final diagnoses:  Colitis    Rx / DC Orders ED Discharge Orders          Ordered    ciprofloxacin (CIPRO) 250 MG tablet  2 times daily        08/17/20 1545    metroNIDAZOLE (FLAGYL) 500 MG tablet  2 times daily        08/17/20  [place], MD 08/17/20 [phone]

## 2020-08-17 NOTE — ED Notes (Signed)
Pt transported to CT ?

## 2020-08-17 NOTE — Assessment & Plan Note (Signed)
Given her advanced age and poor PO intake, I am concerned about progressive dehydration. I have advised patient and her daughter that I think it would be best for her to be evaluated in the ER downstairs where they can do additional lab work and IV fluids. They are agreeable and were brought down by CMA.  MD and Charge nurse were unavailable, so report was given to [place] at the [facility] ED.

## 2020-08-17 NOTE — Discharge Instructions (Addendum)
If you develop worsening, continued, or recurrent abdominal pain, uncontrolled vomiting, fever, bloody diarrhea, chest or back pain, or any other new/concerning symptoms then return to the ER for evaluation.

## 2020-08-21 ENCOUNTER — Other Ambulatory Visit: Payer: Self-pay

## 2020-08-21 ENCOUNTER — Emergency Department (HOSPITAL_BASED_OUTPATIENT_CLINIC_OR_DEPARTMENT_OTHER)
Admission: EM | Admit: 2020-08-21 | Discharge: 2020-08-21 | Disposition: A | Discharge status: Home / Self Care | Payer: Medicare Other | Attending: Emergency Medicine | Admitting: Emergency Medicine

## 2020-08-21 ENCOUNTER — Encounter (HOSPITAL_BASED_OUTPATIENT_CLINIC_OR_DEPARTMENT_OTHER): Payer: Self-pay | Admitting: Emergency Medicine

## 2020-08-21 DIAGNOSIS — K921 Melena: Secondary | ICD-10-CM | POA: Diagnosis present

## 2020-08-21 DIAGNOSIS — Z7901 Long term (current) use of anticoagulants: Secondary | ICD-10-CM | POA: Diagnosis not present

## 2020-08-21 DIAGNOSIS — I10 Essential (primary) hypertension: Secondary | ICD-10-CM | POA: Insufficient documentation

## 2020-08-21 DIAGNOSIS — Z79899 Other long term (current) drug therapy: Secondary | ICD-10-CM | POA: Insufficient documentation

## 2020-08-21 DIAGNOSIS — R197 Diarrhea, unspecified: Secondary | ICD-10-CM | POA: Diagnosis not present

## 2020-08-21 DIAGNOSIS — E039 Hypothyroidism, unspecified: Secondary | ICD-10-CM | POA: Insufficient documentation

## 2020-08-21 DIAGNOSIS — K529 Noninfective gastroenteritis and colitis, unspecified: Secondary | ICD-10-CM | POA: Diagnosis present

## 2020-08-21 LAB — CBC WITH DIFFERENTIAL/PLATELET
Abs Immature Granulocytes: 0.12 K/uL — ABNORMAL HIGH (ref 0.00–0.07)
Basophils Absolute: 0 K/uL (ref 0.0–0.1)
Basophils Relative: 1 %
Eosinophils Absolute: 0 K/uL (ref 0.0–0.5)
Eosinophils Relative: 0 %
HCT: 31.7 % — ABNORMAL LOW (ref 36.0–46.0)
Hemoglobin: 10.5 g/dL — ABNORMAL LOW (ref 12.0–15.0)
Immature Granulocytes: 1 %
Lymphocytes Relative: 19 %
Lymphs Abs: 1.6 K/uL (ref 0.7–4.0)
MCH: 30 pg (ref 26.0–34.0)
MCHC: 33.1 g/dL (ref 30.0–36.0)
MCV: 90.6 fL (ref 80.0–100.0)
Monocytes Absolute: 0.6 K/uL (ref 0.1–1.0)
Monocytes Relative: 7 %
Neutro Abs: 6.2 K/uL (ref 1.7–7.7)
Neutrophils Relative %: 72 %
Platelets: 250 K/uL (ref 150–400)
RBC: 3.5 MIL/uL — ABNORMAL LOW (ref 3.87–5.11)
RDW: 17.4 % — ABNORMAL HIGH (ref 11.5–15.5)
WBC: 8.5 K/uL (ref 4.0–10.5)
nRBC: 0 % (ref 0.0–0.2)

## 2020-08-21 LAB — OCCULT BLOOD X 1 CARD TO LAB, STOOL: Fecal Occult Bld: NEGATIVE

## 2020-08-21 NOTE — Discharge Instructions (Addendum)
The stool culture results should be back in the next few days and you can go over those with your doctor.  Continue to take the probiotic.  You can hold the antibiotics at this time.  If you start running high fevers having severe abdominal pain, vomiting and cannot hold anything down please return to the emergency room.

## 2020-08-21 NOTE — ED Provider Notes (Signed)
[facility] EMERGENC[facility] DEPARTMENT Provider Note   CSN: [id number] Arrival date & time: 08/21/20  1057     History Chief Complaint  Patient presents with   Dark Stools    [name] [name] is a 87 y.o. female.  Patient is a 87 year old female with a history of hypertension, hyperlipidemia, GERD, atrial fibrillation on Eliquis, renal insufficiency and pulmonary hypertension who presents today due to ongoing diarrhea and black stools.  Patient was seen 3 days ago for diarrhea for the last week and occasional abdominal pressure.  She had normal labs at the time and she was sent home with a stool specimen collection kit because she was unable to provide a sample.  Since she has been home she has continued to have diarrhea intermittently.  She has had 3 episodes in the last 12 hours.  However now she has noticed her stool was black and she was concerned that she may be losing blood.  She is still been eating and has an okay appetite.  She does not have any abdominal pain at this time.  She denies any nausea or vomiting.  No known food exposure.  No recent antibiotics.  Her CT 3 days ago showed mild diffuse colitis.  She denies any fever.  No recent medication changes.  The history is provided by the patient and medical records.      Past Medical History:  Diagnosis Date   Anemia    Anxiety    Arthritis    hips, knees   Current use of long term anticoagulation    Diverticulitis of colon 02/06/2015   Gait difficulty    GERD (gastroesophageal reflux disease)    Headache(784.0) 06/22/2012   Hyperlipidemia    Hypertension    IBS (irritable bowel syndrome)    Incontinence    Loss of weight 06/05/2015   Medicare annual wellness visit, subsequent 02/05/2014   Melena    Mild mitral regurgitation    Mild tricuspid regurgitation    Mitral and aortic regurgitation    Mitral valve prolapse    hx of - not seen on recent echoes   Osteopenia    Paroxysmal atrial flutter ([place])    Pedal edema  10/01/2016   Persistent atrial fibrillation (HCC)    Personal history of colonic polyps    Renal lithiasis 06/26/2015   Sinus bradycardia    Thyroid disease    hypo   Vitamin B12 deficiency 03/16/2016   Vitamin D deficiency     Patient Active Problem List   Diagnosis Date Noted   Diarrhea 08/17/2020   Muscle cramp 06/22/2020   Chest pain of uncertain etiology 68/[birth year]/1962   Malnutrition ([place]) 11/09/2019   PAF (paroxysmal atrial fibrillation) ([place])    Esophageal dysphagia 10/29/2018   Cough 09/02/2017   Hyponatremia 06/29/2017   Bradycardia 06/29/2017   Chest pain 12/10/2016   Pedal edema 10/01/2016   Preventative health care 03/18/2016   Vitamin B12 deficiency 03/16/2016   Renal lithiasis 06/26/2015   Loss of weight 06/05/2015   Diverticulitis of colon 02/06/2015   Right-sided thoracic back pain 11/15/2014   Nipple discharge 06/21/2014   Rib pain on right side 04/29/2014   Medicare annual wellness visit, subsequent 02/05/2014   Abdominal pain, chronic, right lower quadrant 02/03/2014   Rectal bleeding 12/03/2013   Prolapse of female pelvic organs 12/03/2013   Leukocytes in urine 11/25/2013   Abnormal chest x-ray 10/23/2013   Pulmonary hypertension ([place]) 10/09/2013   Tricuspid regurgitation 10/09/2013   Chronic anticoagulation  10/09/2013   Hyperglycemia 08/24/2013   Encounter for therapeutic drug monitoring 03/13/2013   Renal insufficiency 06/28/2012   Hyperkalemia 06/28/2012   Headache 06/22/2012   Sinus bradycardia 09/20/2011   Urinary frequency 02/13/2011   Arthritis 11/13/2010   Long term (current) use of anticoagulants 03/31/2010   DIZZINESS 08/03/2009   Achalasia 05/18/2009   DYSPHAGIA UNSPECIFIED 04/11/2009   Anemia 01/19/2009   ESOPHAGEAL STRICTURE 01/19/2009   Anxiety state 09/15/2008   MITRAL REGURGITATION, MILD 08/20/2008   Persistent atrial fibrillation ([place]) 08/20/2008   MITRAL VALVE PROLAPSE, HX OF 08/20/2008   Vitamin D deficiency 01/15/2008   IBS  02/03/2007   Hypothyroidism 01/29/2007   Hyperlipidemia, mixed 01/29/2007   Essential hypertension 01/29/2007   GERD 01/29/2007   Disorder of bone and cartilage 01/29/2007   COLONIC POLYPS, HX OF 01/29/2007    Past Surgical History:  Procedure Laterality Date   BOTOX INJECTION N/A 09/03/2012   Procedure: BOTOX INJECTION;  Surgeon: Inda Castle, MD;  Location: [facility];  Service: Endoscopy;  Laterality: N/A;   BREAST BIOPSY Left    BREAST LUMPECTOMY Right 08/18/2014   Procedure: RIGHT BREAST LUMPECTOMY;  Surgeon: Coralie Keens, MD;  Location: [facility];  Service: General;  Laterality: Right;   CARDIAC ELECTROPHYSIOLOGY MAPPING AND ABLATION     CATARACT EXTRACTION, BILATERAL     ESOPHAGOGASTRODUODENOSCOPY N/A 09/03/2012   Procedure: ESOPHAGOGASTRODUODENOSCOPY (EGD);  Surgeon: Inda Castle, MD;  Location: [facility];  Service: Endoscopy;  Laterality: N/A;   ESOPHAGOGASTRODUODENOSCOPY (EGD) WITH PROPOFOL N/A 12/02/2018   Procedure: ESOPHAGOGASTRODUODENOSCOPY (EGD) WITH PROPOFOL;  Surgeon: [name], MD;  Location: [facility];  Service: Gastroenterology;  Laterality: N/A;   ESOPHAGOSCOPY W/ BOTOX INJECTION     I & D EXTREMITY Right 11/06/2012   Procedure: IRRIGATION AND DEBRIDEMENT EXTREMITY Right Ring Finger;  Surgeon: Tennis Must, MD;  Location: [facility];  Service: Orthopedics;  Laterality: Right;   PARTIAL HYSTERECTOMY     ovaries left in place   skin cancer removal       OB History   No obstetric history on file.     Family History  Problem Relation Age of Onset   Diabetes Mother    CVA Mother    Stroke Mother    COPD Father    Rheum arthritis Father    Allergies Daughter    COPD Daughter    Stroke Daughter    COPD Daughter        previous smoker   Stroke Maternal Grandfather    Heart disease Maternal Aunt    Heart disease Maternal Uncle    Colon cancer Neg Hx    Colon polyps Neg Hx    Esophageal cancer Neg Hx    Gallbladder  disease Neg Hx    Kidney disease Neg Hx    Heart attack Neg Hx    Hypertension Neg Hx     Social History   Tobacco Use   Smoking status: Never   Smokeless tobacco: Never  Vaping Use   Vaping Use: Never used  Substance Use Topics   Alcohol use: No   Drug use: No    Home Medications Prior to Admission medications   Medication Sig Start Date End Date Taking? Authorizing Provider  ALPRAZolam (XANAX) 0.25 MG tablet TAKE 0.5-1 TABLETS (0.125-0.25 MG TOTAL) BY MOUTH 2 (TWO) TIMES DAILY AS NEEDED FOR ANXIETY. 02/29/20   Mosie Lukes, MD  apixaban (ELIQUIS) 2.5 MG TABS tablet Take 1 tablet (2.5 mg total)  by mouth 2 (two) times daily. 04/25/20   Imogene Burn, PA-C  Cholecalciferol (VITAMIN D) 2000 UNITS CAPS Take 2,000 Units by mouth every other day. 02/20/12   Mosie Lukes, MD  ciprofloxacin (CIPRO) 250 MG tablet Take 1 tablet (250 mg total) by mouth 2 (two) times daily for 7 days. One po bid x 7 days 08/17/20 08/24/20  Sherwood Gambler, MD  famotidine (PEPCID) 20 MG tablet Take 40 mg by mouth at bedtime.     [provider]  hydrALAZINE (APRESOLINE) 10 MG tablet TAKE 1 TABLET BY MOUTH THREE TIMES A DAY 04/25/20   Mosie Lukes, MD  isosorbide mononitrate (IMDUR) 30 MG 24 hr tablet Take 1 tablet (30 mg total) by mouth daily. 05/23/20   [name], MD  levocetirizine (XYZAL) 5 MG tablet TAKE 1/2 TABLET (2.5 MG TOTAL) BY MOUTH EVERY EVENING. 08/17/20   Debbrah Alar, NP  metoprolol succinate (TOPROL XL) 25 MG 24 hr tablet Take 1 tablet (25 mg total) by mouth daily. 08/08/20   [name], MD  metroNIDAZOLE (FLAGYL) 500 MG tablet Take 1 tablet (500 mg total) by mouth 2 (two) times daily. One po bid x 7 days 08/17/20   Sherwood Gambler, MD  pantoprazole (PROTONIX) 40 MG tablet TAKE 1 TABLET BY MOUTH EVERY DAY 03/15/20   Mosie Lukes, MD  vitamin B-12 (CYANOCOBALAMIN) 1000 MCG tablet Take 1,000 mcg by mouth daily.    [provider]    Allergies     Amlodipine, Darifenacin hydrobromide, Other, Sulfonamide derivatives, and Tramadol  Review of Systems   Review of Systems  All other systems reviewed and are negative.  Physical Exam Updated Vital Signs BP (!) 148/57   Pulse 63   Temp 98.6 F (37 C) (Oral)   Resp 16   Wt 39.6 kg   SpO2 96%   BMI 17.63 kg/m   Physical Exam Vitals and nursing note reviewed.  Constitutional:      General: She is in acute distress.     Appearance: She is well-developed.  HENT:     Head: Normocephalic and atraumatic.  Eyes:     Pupils: Pupils are equal, round, and reactive to light.  Cardiovascular:     Rate and Rhythm: Normal rate and regular rhythm.     Heart sounds: Normal heart sounds. No murmur heard.   No friction rub.  Pulmonary:     Effort: Pulmonary effort is normal.     Breath sounds: Normal breath sounds. No wheezing or rales.  Abdominal:     General: Bowel sounds are normal. There is no distension.     Palpations: Abdomen is soft.     Tenderness: There is no abdominal tenderness. There is no guarding or rebound.  Genitourinary:    Rectum: Guaiac result negative.     Comments: Stool is brown Musculoskeletal:        General: No tenderness. Normal range of motion.     Comments: No edema  Skin:    General: Skin is warm and dry.     Findings: No rash.  Neurological:     Mental Status: She is alert and oriented to person, place, and time. Mental status is at baseline.     Cranial Nerves: No cranial nerve deficit.  Psychiatric:        Mood and Affect: Mood normal.        Behavior: Behavior normal.        Thought Content: Thought content normal.  ED Results / Procedures / Treatments   Labs (all labs ordered are listed, but only abnormal results are displayed) Labs Reviewed  CBC WITH DIFFERENTIAL/PLATELET - Abnormal; Notable for the following components:      Result Value   RBC 3.50 (*)    Hemoglobin 10.5 (*)    HCT 31.7 (*)    RDW 17.4 (*)    Abs Immature  Granulocytes 0.12 (*)    All other components within normal limits  GASTROINTESTINAL PANEL BY PCR, STOOL (REPLACES STOOL CULTURE)  OCCULT BLOOD X 1 CARD TO LAB, STOOL    EKG None  Radiology No results found.  Procedures Procedures   Medications Ordered in ED Medications - No data to display  ED Course  I have reviewed the triage vital signs and the nursing notes.  Pertinent labs & imaging results that were available during my care of the patient were reviewed by me and considered in my medical decision making (see chart for details).    MDM Rules/Calculators/A&P                          Elderly female with multiple medical problems who is anticoagulated and recently seen and diagnosed with colitis who returns today because she was able to obtain a stool sample and because her stool has been black. On exam she is well-appearing with reassuring vital signs.  Repeat CBC today with a hemoglobin of 10.5 from 11 3 days ago.  Hemoccult is negative and small amount of stool that was removed was brown.  She did bring in a stool specimen today.  Stool sample was sent.  Feel that patient is stable for discharge home.  She will continue probiotic.  She was given Cipro and Flagyl but they made her sick and she does not want to take them.  Instructed her to hold these antibiotics and still has stool samples return.  MDM   Amount and/or Complexity of Data Reviewed Clinical lab tests: ordered and reviewed Independent visualization of images, tracings, or specimens: yes     Final Clinical Impression(s) / ED Diagnoses Final diagnoses:  Diarrhea, unspecified type    Rx / DC Orders ED Discharge Orders     None        Blanchie Dessert, MD 08/21/20 1307

## 2020-08-21 NOTE — ED Triage Notes (Signed)
Pt sts she was here Thursday for diarrhea; reports stools are soft, but color has changed (black); denies pain

## 2020-08-22 ENCOUNTER — Telehealth: Payer: Self-pay

## 2020-08-22 LAB — GASTROINTESTINAL PANEL BY PCR, STOOL (REPLACES STOOL CULTURE)

## 2020-08-22 NOTE — Telephone Encounter (Signed)
Can you review labs from yesterday so I can let her know the results?

## 2020-08-23 ENCOUNTER — Other Ambulatory Visit: Payer: Self-pay

## 2020-08-23 DIAGNOSIS — D649 Anemia, unspecified: Secondary | ICD-10-CM

## 2020-08-23 NOTE — Telephone Encounter (Signed)
Pt is aware and schedule for lab appointment

## 2020-08-24 ENCOUNTER — Other Ambulatory Visit: Payer: Medicare Other

## 2020-08-29 ENCOUNTER — Other Ambulatory Visit: Payer: Self-pay | Admitting: Family Medicine

## 2020-08-29 DIAGNOSIS — I10 Essential (primary) hypertension: Secondary | ICD-10-CM

## 2020-09-20 ENCOUNTER — Other Ambulatory Visit: Payer: Self-pay | Admitting: Family Medicine

## 2020-10-24 ENCOUNTER — Encounter: Payer: Self-pay | Admitting: Family Medicine

## 2020-10-24 ENCOUNTER — Other Ambulatory Visit: Payer: Self-pay

## 2020-10-24 ENCOUNTER — Ambulatory Visit (INDEPENDENT_AMBULATORY_CARE_PROVIDER_SITE_OTHER): Payer: Medicare Other | Admitting: Family Medicine

## 2020-10-24 VITALS — BP 130/62 | HR 73 | Temp 97.5°F | Resp 16 | Wt 87.0 lb

## 2020-10-24 DIAGNOSIS — K589 Irritable bowel syndrome without diarrhea: Secondary | ICD-10-CM | POA: Diagnosis not present

## 2020-10-24 DIAGNOSIS — D649 Anemia, unspecified: Secondary | ICD-10-CM

## 2020-10-24 DIAGNOSIS — N289 Disorder of kidney and ureter, unspecified: Secondary | ICD-10-CM

## 2020-10-24 DIAGNOSIS — G4700 Insomnia, unspecified: Secondary | ICD-10-CM | POA: Diagnosis not present

## 2020-10-24 DIAGNOSIS — R252 Cramp and spasm: Secondary | ICD-10-CM

## 2020-10-24 DIAGNOSIS — E875 Hyperkalemia: Secondary | ICD-10-CM

## 2020-10-24 DIAGNOSIS — E559 Vitamin D deficiency, unspecified: Secondary | ICD-10-CM | POA: Diagnosis not present

## 2020-10-24 DIAGNOSIS — E039 Hypothyroidism, unspecified: Secondary | ICD-10-CM

## 2020-10-24 DIAGNOSIS — E538 Deficiency of other specified B group vitamins: Secondary | ICD-10-CM | POA: Diagnosis not present

## 2020-10-24 DIAGNOSIS — E782 Mixed hyperlipidemia: Secondary | ICD-10-CM | POA: Diagnosis not present

## 2020-10-24 DIAGNOSIS — I10 Essential (primary) hypertension: Secondary | ICD-10-CM

## 2020-10-24 DIAGNOSIS — E440 Moderate protein-calorie malnutrition: Secondary | ICD-10-CM

## 2020-10-24 DIAGNOSIS — Z23 Encounter for immunization: Secondary | ICD-10-CM

## 2020-10-24 DIAGNOSIS — R634 Abnormal weight loss: Secondary | ICD-10-CM

## 2020-10-24 DIAGNOSIS — R739 Hyperglycemia, unspecified: Secondary | ICD-10-CM | POA: Diagnosis not present

## 2020-10-24 MED ORDER — ALPRAZOLAM 0.25 MG PO TABS: 0.1250 mg | ORAL_TABLET | Freq: Two times a day (BID) | ORAL | 1 refills | Status: DC | PRN

## 2020-10-24 NOTE — Assessment & Plan Note (Signed)
Hydrate and monitor 

## 2020-10-24 NOTE — Assessment & Plan Note (Signed)
Previously noted to have low protein in blood stream will recheck labs and she is encouraged to increase protein in the diet.

## 2020-10-24 NOTE — Progress Notes (Signed)
Subjective:   By signing my name below, I, Zite Okoli, attest that this documentation has been prepared under the direction and in the presence of Mosie Lukes, MD. 10/24/2020     Patient ID: [name] [name], female    DOB: 08/28/1924, 87 y.o.   MRN: [id number]  No chief complaint on file.   HPI Patient is in today for an office visit. She is accompanied by her daughter.  Her blood pressure at this visit is stable. BP Readings from Last 3 Encounters:  10/24/20 130/62  08/21/20 (!) 165/70  08/17/20 (!) 165/76     She has been managing her bowel problems with 40 mg Protonix and is doing well on it. However, her daughter mentions that she does not drink a lot of  water.   She denies urinary problems, diarrhea, blood in stool and muscle cramps.  She is due for the new shingles vaccine. She is willing to get the flu vaccine today.                                                                                                                                                                                                                                                                                           Past Medical History:  Diagnosis Date   Anemia    Anxiety    Arthritis    hips, knees   Current use of long term anticoagulation    Diverticulitis of colon 02/06/2015   Gait difficulty    GERD (gastroesophageal reflux disease)    Headache(784.0) 06/22/2012   Hyperlipidemia    Hypertension    IBS (irritable bowel syndrome)    Incontinence    Loss of weight 06/05/2015   Medicare annual wellness visit, subsequent 02/05/2014   Melena    Mild mitral regurgitation    Mild tricuspid regurgitation    Mitral and aortic regurgitation    Mitral valve prolapse    hx of - not seen on recent echoes   Osteopenia    Paroxysmal atrial flutter ([place])    Pedal edema 10/01/2016   Persistent atrial fibrillation ([place])  Personal history of colonic polyps    Renal lithiasis 06/26/2015   Sinus  bradycardia    Thyroid disease    hypo   Vitamin B12 deficiency 03/16/2016   Vitamin D deficiency     Past Surgical History:  Procedure Laterality Date   BOTOX INJECTION N/A 09/03/2012   Procedure: BOTOX INJECTION;  Surgeon: Inda Castle, MD;  Location: [facility];  Service: Endoscopy;  Laterality: N/A;   BREAST BIOPSY Left    BREAST LUMPECTOMY Right 08/18/2014   Procedure: RIGHT BREAST LUMPECTOMY;  Surgeon: Coralie Keens, MD;  Location: [facility];  Service: General;  Laterality: Right;   CARDIAC ELECTROPHYSIOLOGY MAPPING AND ABLATION     CATARACT EXTRACTION, BILATERAL     ESOPHAGOGASTRODUODENOSCOPY N/A 09/03/2012   Procedure: ESOPHAGOGASTRODUODENOSCOPY (EGD);  Surgeon: Inda Castle, MD;  Location: [facility];  Service: Endoscopy;  Laterality: N/A;   ESOPHAGOGASTRODUODENOSCOPY (EGD) WITH PROPOFOL N/A 12/02/2018   Procedure: ESOPHAGOGASTRODUODENOSCOPY (EGD) WITH PROPOFOL;  Surgeon: [name], MD;  Location: [facility];  Service: Gastroenterology;  Laterality: N/A;   ESOPHAGOSCOPY W/ BOTOX INJECTION     I & D EXTREMITY Right 11/06/2012   Procedure: IRRIGATION AND DEBRIDEMENT EXTREMITY Right Ring Finger;  Surgeon: Tennis Must, MD;  Location: [facility];  Service: Orthopedics;  Laterality: Right;   PARTIAL HYSTERECTOMY     ovaries left in place   skin cancer removal      Family History  Problem Relation Age of Onset   Diabetes Mother    CVA Mother    Stroke Mother    COPD Father    Rheum arthritis Father    Allergies Daughter    COPD Daughter    Stroke Daughter    COPD Daughter        previous smoker   Stroke Maternal Grandfather    Heart disease Maternal Aunt    Heart disease Maternal Uncle    Colon cancer Neg Hx    Colon polyps Neg Hx    Esophageal cancer Neg Hx    Gallbladder disease Neg Hx    Kidney disease Neg Hx    Heart attack Neg Hx    Hypertension Neg Hx     Social History   Socioeconomic History   Marital status: Widowed     Spouse name: Not on file   Number of children: 5   Years of education: Not on file   Highest education level: Not on file  Occupational History   Occupation: retired    Fish farm manager: RETIRED  Tobacco Use   Smoking status: Never   Smokeless tobacco: Never  Vaping Use   Vaping Use: Never used  Substance and Sexual Activity   Alcohol use: No   Drug use: No   Sexual activity: Not on file    Comment: lives with Daughter, grandson and his family. no dietary restricitons.  Other Topics Concern   Not on file  Social History Narrative   Not on file   Social Determinants of Health   Financial Resource Strain: Not on file  Food Insecurity: Not on file  Transportation Needs: Not on file  Physical Activity: Not on file  Stress: Not on file  Social Connections: Not on file  Intimate Partner Violence: Not on file    Outpatient Medications Prior to Visit  Medication Sig Dispense Refill   apixaban (ELIQUIS) 2.5 MG TABS tablet Take 1 tablet (2.5 mg total) by mouth 2 (two) times daily. 60 tablet 5   Cholecalciferol (VITAMIN  D) 2000 UNITS CAPS Take 2,000 Units by mouth every other day.     famotidine (PEPCID) 20 MG tablet Take 40 mg by mouth at bedtime.      hydrALAZINE (APRESOLINE) 10 MG tablet TAKE 1 TABLET BY MOUTH THREE TIMES A DAY 270 tablet 1   isosorbide mononitrate (IMDUR) 30 MG 24 hr tablet Take 1 tablet (30 mg total) by mouth daily. 90 tablet 3   levocetirizine (XYZAL) 5 MG tablet TAKE 1/2 TABLET (2.5 MG TOTAL) BY MOUTH EVERY EVENING. 45 tablet 2   metoprolol succinate (TOPROL XL) 25 MG 24 hr tablet Take 1 tablet (25 mg total) by mouth daily. 90 tablet 1   pantoprazole (PROTONIX) 40 MG tablet TAKE 1 TABLET BY MOUTH EVERY DAY 90 tablet 1   ALPRAZolam (XANAX) 0.25 MG tablet TAKE 0.5-1 TABLETS (0.125-0.25 MG TOTAL) BY MOUTH 2 (TWO) TIMES DAILY AS NEEDED FOR ANXIETY. 30 tablet 1   metroNIDAZOLE (FLAGYL) 500 MG tablet Take 1 tablet (500 mg total) by mouth 2 (two) times daily. One po bid x 7  days 14 tablet 0   vitamin B-12 (CYANOCOBALAMIN) 1000 MCG tablet Take 1,000 mcg by mouth daily.     No facility-administered medications prior to visit.    Allergies  Allergen Reactions   Amlodipine Shortness Of Breath   Darifenacin Hydrobromide Other (See Comments)    dizziness   Other Other (See Comments)    dizziness   Sulfonamide Derivatives Other (See Comments)    dizziness   Tramadol Other (See Comments)    Insomnia, anorexia    Review of Systems  Constitutional:  Negative for fever and malaise/fatigue.  HENT:  Negative for congestion.   Eyes:  Negative for redness.  Respiratory:  Negative for shortness of breath.   Cardiovascular:  Negative for chest pain, palpitations and leg swelling.  Gastrointestinal:  Negative for abdominal pain, blood in stool and nausea.  Genitourinary:  Negative for dysuria and frequency.  Musculoskeletal:  Negative for falls.  Skin:  Negative for rash.  Neurological:  Negative for dizziness, loss of consciousness and headaches.  Endo/Heme/Allergies:  Negative for polydipsia.  Psychiatric/Behavioral:  Negative for depression. The patient is not nervous/anxious.       Objective:    Physical Exam Constitutional:      General: She is not in acute distress.    Appearance: She is well-developed.  HENT:     Head: Normocephalic and atraumatic.  Eyes:     Conjunctiva/sclera: Conjunctivae normal.  Neck:     Thyroid: No thyromegaly.  Cardiovascular:     Rate and Rhythm: Normal rate and regular rhythm.     Heart sounds: Normal heart sounds. No murmur heard. Pulmonary:     Effort: Pulmonary effort is normal. No respiratory distress.     Breath sounds: Normal breath sounds.  Abdominal:     General: Bowel sounds are normal. There is no distension.     Palpations: Abdomen is soft. There is no mass.     Tenderness: There is no abdominal tenderness.  Musculoskeletal:     Cervical back: Neck supple.  Lymphadenopathy:     Cervical: No cervical  adenopathy.  Skin:    General: Skin is warm and dry.  Neurological:     Mental Status: She is alert and oriented to person, place, and time.  Psychiatric:        Behavior: Behavior normal.    BP 130/62   Pulse 73   Temp (!) 97.5 F (36.4 C)  Resp 16   Wt 87 lb (39.5 kg)   SpO2 98%   BMI 17.57 kg/m  Wt Readings from Last 3 Encounters:  10/24/20 87 lb (39.5 kg)  08/21/20 87 lb 4.8 oz (39.6 kg)  08/17/20 87 lb 3.2 oz (39.6 kg)    Diabetic Foot Exam - Simple   No data filed    Lab Results  Component Value Date   WBC 8.5 08/21/2020   HGB 10.5 (L) 08/21/2020   HCT 31.7 (L) 08/21/2020   PLT 250 08/21/2020   GLUCOSE 92 08/17/2020   CHOL 136 06/21/2020   TRIG 113.0 06/21/2020   HDL 32.20 (L) 06/21/2020   LDLDIRECT 72.1 01/19/2009   LDLCALC 81 06/21/2020   ALT 15 08/17/2020   AST 22 08/17/2020   NA 136 08/17/2020   K 3.6 08/17/2020   CL 101 08/17/2020   CREATININE 0.94 08/17/2020   BUN 16 08/17/2020   CO2 24 08/17/2020   TSH 2.85 06/21/2020   INR 2.1 04/25/2018   HGBA1C 5.6 10/29/2019    Lab Results  Component Value Date   TSH 2.85 06/21/2020   Lab Results  Component Value Date   WBC 8.5 08/21/2020   HGB 10.5 (L) 08/21/2020   HCT 31.7 (L) 08/21/2020   MCV 90.6 08/21/2020   PLT 250 08/21/2020   Lab Results  Component Value Date   NA 136 08/17/2020   K 3.6 08/17/2020   CO2 24 08/17/2020   GLUCOSE 92 08/17/2020   BUN 16 08/17/2020   CREATININE 0.94 08/17/2020   BILITOT 0.5 08/17/2020   ALKPHOS 16 (L) 08/17/2020   AST 22 08/17/2020   ALT 15 08/17/2020   PROT 7.1 08/17/2020   ALBUMIN 3.2 (L) 08/17/2020   CALCIUM 8.5 (L) 08/17/2020   ANIONGAP 11 08/17/2020   GFR 37.62 (L) 07/15/2020   Lab Results  Component Value Date   CHOL 136 06/21/2020   Lab Results  Component Value Date   HDL 32.20 (L) 06/21/2020   Lab Results  Component Value Date   LDLCALC 81 06/21/2020   Lab Results  Component Value Date   TRIG 113.0 06/21/2020   Lab Results   Component Value Date   CHOLHDL 4 06/21/2020   Lab Results  Component Value Date   HGBA1C 5.6 10/29/2019       Assessment & Plan:   Problem List Items Addressed This Visit     Essential hypertension (Chronic)   Malnutrition ([place]) (Chronic)    Previously noted to have low protein in blood stream will recheck labs and she is encouraged to increase protein in the diet.       Hypothyroidism   Relevant Orders   TSH   Vitamin D deficiency    Supplement and monitor      Relevant Orders   Comprehensive metabolic panel   VITAMIN D 25 Hydroxy (Vit-D Deficiency, Fractures)   Hyperlipidemia, mixed    Encourage heart healthy diet such as MIND or DASH diet, increase exercise, avoid trans fats, simple carbohydrates and processed foods, consider a krill or fish or flaxseed oil cap daily.       Relevant Orders   Lipid panel   Anemia   Relevant Orders   CBC   IBS   Renal insufficiency    Hydrate and monitor      Hyperkalemia   Hyperglycemia   Relevant Orders   Hemoglobin A1c   Loss of weight    Weight is stable over the past couple of checks but  is down from her baseline low 90s. She will continue to try and increase her caloric intake and we will monitor      Vitamin B12 deficiency    Supplement and monitor      Relevant Orders   Vitamin B12   Muscle cramp   Relevant Orders   Magnesium   Other Visit Diagnoses     Need for influenza vaccination    -  Primary   Relevant Orders   Flu Vaccine QUAD High Dose(Fluad) (Completed)   Need for viral immunization       Relevant Orders   Varicella zoster antibody, IgG   Insomnia, unspecified type       Relevant Medications   ALPRAZolam (XANAX) 0.25 MG tablet       Meds ordered this encounter  Medications   ALPRAZolam (XANAX) 0.25 MG tablet    Sig: Take 0.5-1 tablets (0.125-0.25 mg total) by mouth 2 (two) times daily as needed for anxiety.    Dispense:  30 tablet    Refill:  1    This request is for a new  prescription for a controlled substance as required by Federal/State law. DX Code Needed  .    I,Zite Okoli,acting as a Education administrator for Penni Homans, MD.,have documented all relevant documentation on the behalf of Penni Homans, MD,as directed by  Penni Homans, MD while in the presence of Penni Homans, MD.   I, Mosie Lukes, MD. , personally preformed the services described in this documentation.  All medical record entries made by the scribe were at my direction and in my presence.  I have reviewed the chart and discharge instructions (if applicable) and agree that the record reflects my personal performance and is accurate and complete. 10/24/2020

## 2020-10-24 NOTE — Assessment & Plan Note (Signed)
Supplement and monitor 

## 2020-10-24 NOTE — Assessment & Plan Note (Signed)
Encourage heart healthy diet such as MIND or DASH diet, increase exercise, avoid trans fats, simple carbohydrates and processed foods, consider a krill or fish or flaxseed oil cap daily.  °

## 2020-10-24 NOTE — Assessment & Plan Note (Signed)
Weight is stable over the past couple of checks but is down from her baseline low 90s. She will continue to try and increase her caloric intake and we will monitor

## 2020-10-24 NOTE — Patient Instructions (Signed)
Protein Content in Foods Protein is a necessary nutrient in any diet. It helps build and repair muscles, bones, and skin. Depending on your overall health, you may need more or less protein in your diet. You are encouraged to eat a variety of protein foods to ensure that you get all the essential nutrients that are found in different protein foods. Talk with your health care provider or dietitian about how much protein you need each day and which sources of protein are best for you. Protein is especially important for: Repairing and making cells and tissues. Fighting infection. Providing energy. Growth and development. See the following list for the protein content of some common foods. What are tips for getting more protein in your diet? Try to replace processed carbohydrates with high-quality protein. Snack on nuts and seeds instead of chips. Replace baked desserts with [place] yogurt. Eat protein foods from both plant and animal sources. Replace red meat with seafood. Add beans and peas to salads, soups, and side dishes. Include a protein food with each meal and snack. Reading food labels You can find the amount of protein in a food item by looking at the nutrition facts label. Use the total grams listed to help you reach your daily goal. What foods are high in protein? High-protein foods contain 4 grams (g) or more of protein per serving. They include: Grains Quinoa (cooked) -- 1 cup (185 g) has 8 g of protein. Whole wheat pasta (cooked) -- 1 cup (140 g) has 6 g of protein. Meat Beef, ground sirloin (cooked) -- 3 oz (85 g) has 24 g of protein. Chicken breast, boneless and skinless (cooked) -- 3 oz (85 g) has 25 g of protein. Egg -- 1 egg has 6 g of protein. Fish, filet (cooked) -- 1 oz (28 g) has 6-7 g of protein. Lamb (cooked) -- 3 oz (85 g) has 24 g of protein. Pork tenderloin (cooked) -- 3 oz (85 g) has 23 g of protein. Tuna (canned in water) -- 3 oz (85 g) has 20 g of  protein. Dairy Cottage cheese --  cup (114 g) has 13.4 g of protein. Milk -- 1 cup (237 mL) has 8 g of protein. Cheese (hard) -- 1 oz (28 g) has 7 g of protein. Yogurt, regular -- 6 oz (170 g) has 8 g of protein. Greek yogurt -- 6 oz (200 g) has 18 g protein. Plant protein Garbanzo beans (canned or cooked) --  cup (130 g) has 6-7 g of protein. Kidney beans (canned or cooked) --  cup (130 g) has 6-7 g of protein. Nuts (peanuts, pistachios, almonds) -- 1 oz (28 g) has 6 g of protein. Peanut butter -- 1 oz (32 g) has 7-8 g of protein. Pumpkin seeds -- 1 oz (28 g) has 8.5 g of protein. Soybeans (roasted) -- 1 oz (28 g) has 8 g of protein. Soybeans (cooked) --  cup (90 g) has 11 g of protein. Soy milk -- 1 cup (250 mL) has 5-10 g of protein. Soy or vegetable patty -- 1 patty has 11 g of protein. Sunflower seeds -- 1 oz (28 g) has 5.5 g of protein. Buckwheat -- 1 oz (33 g) has 4.3 g of protein. Tofu (firm) --  cup (124 g) has 20 g of protein. Tempeh --  cup (83 g) has 16 g of protein. The items listed above may not be a complete list of foods high in protein. Actual amounts of protein may differ depending  on processing. Contact a dietitian for more information. What foods are low in protein? Low-protein foods contain 3 grams (g) or less of protein per serving. They include: Fruits Fruit or vegetable juice --  cup (125 mL) has 1 g of protein. Vegetables Beets (raw or cooked) --  cup (68 g) has 1.5 g of protein. Broccoli (raw or cooked) --  cup (44 g) has 2 g of protein. Collard greens (raw or cooked) --  cup (42 g) has 2 g of protein. Green beans (raw or cooked) --  cup (83 g) has 1 g of protein. Green peas (canned) --  cup (80 g) has 3.5 g of protein. Potato (baked with skin) -- 1 medium potato (173 g) has 3 g of protein. Spinach (cooked) --  cup (90 g) has 3 g of protein. Squash (cooked) --  cup (90 g) has 1.5 g of protein. Avocado -- 1 cup (146 g) has 2.7 g of  protein. Grains Bran cereal --  cup (30 g) has 2-3 g of protein. Bread -- 1 slice has 2.5 g of protein. Corn (fresh or cooked) --  cup (77 g) has 2 g of protein. Flour tortilla -- One 6-inch (15 cm) tortilla has 2.5 g of protein. Muffins -- 1 small muffin (2 oz or 57 g) has 3 g of protein. Oatmeal (cooked) --  cup (40 g) has 3 g of protein. Rice (cooked) --  cup (79 g) has 2.5-3.5 g of protein. Dairy Cream cheese -- 1 oz (29 g) has 2 g of protein. Creamer (half-and-half) -- 1 oz (29 mL) has 1 g of protein. Frozen yogurt --  cup (72 g) has 3 g of protein. Sour cream --  cup (75 g) has 2.5 g of protein. The items listed above may not be a complete list of foods low in protein. Actual amounts of protein may differ depending on processing. Contact a dietitian for more information. Summary Protein is a nutrient that your body needs for growth and development, repairing and making cells and tissues, fighting infection, and providing energy. Protein is in both plant and animal foods. Some of these foods have more protein than others. Depending on your overall health, you may need more or less protein in your diet. Talk to your health care provider about how much protein you need. This information is not intended to replace advice given to you by your health care provider. Make sure you discuss any questions you have with your health care provider. Document Revised: 11/23/2019 Document Reviewed: 11/23/2019 Elsevier Patient Education  2022 [employer].

## 2020-10-25 LAB — CBC
HCT: 30.1 % — ABNORMAL LOW (ref 36.0–46.0)
Hemoglobin: 9.9 g/dL — ABNORMAL LOW (ref 12.0–15.0)
MCHC: 33 g/dL (ref 30.0–36.0)
MCV: 91.7 fl (ref 78.0–100.0)
Platelets: 210 10*3/uL (ref 150.0–400.0)
RBC: 3.28 Mil/uL — ABNORMAL LOW (ref 3.87–5.11)
RDW: 15.7 % — ABNORMAL HIGH (ref 11.5–15.5)
WBC: 7.1 10*3/uL (ref 4.0–10.5)

## 2020-10-25 LAB — LIPID PANEL
Cholesterol: 117 mg/dL (ref 0–200)
HDL: 34.4 mg/dL — ABNORMAL LOW (ref >39.00)
LDL Cholesterol: 57 mg/dL (ref 0–99)
NonHDL: 82.11
Total CHOL/HDL Ratio: 3
Triglycerides: 128 mg/dL (ref 0.0–149.0)
VLDL: 25.6 mg/dL (ref 0.0–40.0)

## 2020-10-25 LAB — VITAMIN B12: Vitamin B-12: 192 pg/mL — ABNORMAL LOW (ref 211–911)

## 2020-10-25 LAB — VARICELLA ZOSTER ANTIBODY, IGG: Varicella IgG: 825.7 index

## 2020-10-25 LAB — COMPREHENSIVE METABOLIC PANEL
ALT: 8 U/L (ref 0–35)
AST: 13 U/L (ref 0–37)
Albumin: 3.3 g/dL — ABNORMAL LOW (ref 3.5–5.2)
Alkaline Phosphatase: 18 U/L — ABNORMAL LOW (ref 39–117)
BUN: 18 mg/dL (ref 6–23)
CO2: 29 mEq/L (ref 19–32)
Calcium: 8.5 mg/dL (ref 8.4–10.5)
Chloride: 101 mEq/L (ref 96–112)
Creatinine, Ser: 1.15 mg/dL (ref 0.40–1.20)
GFR: 40.7 mL/min — ABNORMAL LOW (ref >60.00)
Glucose, Bld: 89 mg/dL (ref 70–99)
Potassium: 3.9 mEq/L (ref 3.5–5.1)
Sodium: 137 mEq/L (ref 135–145)
Total Bilirubin: 0.4 mg/dL (ref 0.2–1.2)
Total Protein: 6.6 g/dL (ref 6.0–8.3)

## 2020-10-25 LAB — TSH: TSH: 2.72 u[IU]/mL (ref 0.35–5.50)

## 2020-10-25 LAB — HEMOGLOBIN A1C: Hgb A1c MFr Bld: 5.5 % (ref 4.6–6.5)

## 2020-10-25 LAB — MAGNESIUM: Magnesium: 1.8 mg/dL (ref 1.5–2.5)

## 2020-10-25 LAB — VITAMIN D 25 HYDROXY (VIT D DEFICIENCY, FRACTURES): VITD: 37.39 ng/mL (ref 30.00–100.00)

## 2020-10-26 ENCOUNTER — Other Ambulatory Visit: Payer: Self-pay

## 2020-10-26 DIAGNOSIS — D649 Anemia, unspecified: Secondary | ICD-10-CM

## 2020-10-26 NOTE — Addendum Note (Signed)
Addended by: Randolm Idol A on: 10/26/2020 10:52 AM   Modules accepted: Orders

## 2020-11-28 ENCOUNTER — Encounter: Payer: Self-pay | Admitting: *Deleted

## 2020-11-28 ENCOUNTER — Other Ambulatory Visit: Payer: Self-pay

## 2020-11-28 ENCOUNTER — Other Ambulatory Visit (INDEPENDENT_AMBULATORY_CARE_PROVIDER_SITE_OTHER): Payer: Medicare Other

## 2020-11-28 DIAGNOSIS — D649 Anemia, unspecified: Secondary | ICD-10-CM | POA: Diagnosis not present

## 2020-11-28 LAB — CBC WITH DIFFERENTIAL/PLATELET
Basophils Absolute: 0 10*3/uL (ref 0.0–0.1)
Basophils Relative: 0.3 % (ref 0.0–3.0)
Eosinophils Absolute: 0 10*3/uL (ref 0.0–0.7)
Eosinophils Relative: 0.1 % (ref 0.0–5.0)
HCT: 30.4 % — ABNORMAL LOW (ref 36.0–46.0)
Hemoglobin: 10.2 g/dL — ABNORMAL LOW (ref 12.0–15.0)
Lymphocytes Relative: 21.7 % (ref 12.0–46.0)
Lymphs Abs: 1.5 10*3/uL (ref 0.7–4.0)
MCHC: 33.6 g/dL (ref 30.0–36.0)
MCV: 90.1 fl (ref 78.0–100.0)
Monocytes Absolute: 0.4 10*3/uL (ref 0.1–1.0)
Monocytes Relative: 6 % (ref 3.0–12.0)
Neutro Abs: 5.1 10*3/uL (ref 1.4–7.7)
Neutrophils Relative %: 71.9 % (ref 43.0–77.0)
Platelets: 219 10*3/uL (ref 150.0–400.0)
RBC: 3.38 Mil/uL — ABNORMAL LOW (ref 3.87–5.11)
RDW: 15.4 % (ref 11.5–15.5)
WBC: 7.1 10*3/uL (ref 4.0–10.5)

## 2020-11-28 LAB — COMPREHENSIVE METABOLIC PANEL
ALT: 9 U/L (ref 0–35)
AST: 15 U/L (ref 0–37)
Albumin: 3.2 g/dL — ABNORMAL LOW (ref 3.5–5.2)
Alkaline Phosphatase: 15 U/L — ABNORMAL LOW (ref 39–117)
BUN: 18 mg/dL (ref 6–23)
CO2: 28 mEq/L (ref 19–32)
Calcium: 8.5 mg/dL (ref 8.4–10.5)
Chloride: 100 mEq/L (ref 96–112)
Creatinine, Ser: 1 mg/dL (ref 0.40–1.20)
GFR: 48.1 mL/min — ABNORMAL LOW (ref >60.00)
Glucose, Bld: 106 mg/dL — ABNORMAL HIGH (ref 70–99)
Potassium: 3.8 mEq/L (ref 3.5–5.1)
Sodium: 137 mEq/L (ref 135–145)
Total Bilirubin: 0.4 mg/dL (ref 0.2–1.2)
Total Protein: 6.7 g/dL (ref 6.0–8.3)

## 2020-11-29 ENCOUNTER — Other Ambulatory Visit: Payer: Self-pay | Admitting: *Deleted

## 2020-11-29 LAB — IRON,TIBC AND FERRITIN PANEL
%SAT: 14 % (calc) — ABNORMAL LOW (ref 16–45)
Ferritin: 24 ng/mL (ref 16–288)
Iron: 42 ug/dL — ABNORMAL LOW (ref 45–160)
TIBC: 291 mcg/dL (calc) (ref 250–450)

## 2020-11-29 MED ORDER — HEMOCYTE-F 324-1 MG PO TABS: 1.0000 | ORAL_TABLET | Freq: Every day | ORAL | 5 refills | Status: DC

## 2020-11-29 NOTE — Progress Notes (Deleted)
Cardiology Office Note    Date:  11/29/2020   ID:  [name] [name] [birth year], MRN [id number]   PCP:  [name] [name], [place]  Cardiologist:  [name] Dawley, MD (Inactive) *** Advanced Practice Provider:  No care team member to display Electrophysiologist:  None   [phone]   No chief complaint on file.   History of Present Illness:  [name] [name] is a 87 y.o. female  with history of persistent atrial fibrillation, prior atrial flutter as well, essential hypertension, sinus bradycardia, hyperlipidemia, anemia, anxiety, arthritis, diverticulosis, GERD, thyroid disease and low body weight   She was then re-hospitalized with weakness and decreased appetite found to be hyponatremic and slightly bradycardic. She was treated for possible SIADH. Initially the patient was bradycardic and her beta-blocker was held but heart rate increased to greater than 100. She was seen in consultation by Dr. Marlou [name] who felt that her rhythm was actually atrial flutter appearing. He advised continued rate control on low-dose Toprol and watch for bradycardia   Event monitoring in 09/2018 with findings of persistent atrial flutter with variable block with minimum HR 45, maximum HR 136 and average HR 65 BPM. 1 episode of ventricular tachycardia lasting 8 beats.We continued the same management medical management.    Patient saw [name] [name] in May and was doing well.  Past Medical History:  Diagnosis Date   Anemia    Anxiety    Arthritis    hips, knees   Current use of long term anticoagulation    Diverticulitis of colon 02/06/2015   Gait difficulty    GERD (gastroesophageal reflux disease)    Headache(784.0) 06/22/2012   Hyperlipidemia    Hypertension    IBS (irritable bowel syndrome)    Incontinence    Loss of weight 06/05/2015   Medicare annual wellness visit, subsequent 02/05/2014   Melena    Mild mitral regurgitation    Mild tricuspid regurgitation     Mitral and aortic regurgitation    Mitral valve prolapse    hx of - not seen on recent echoes   Osteopenia    Paroxysmal atrial flutter ([place])    Pedal edema 10/01/2016   Persistent atrial fibrillation ([facility])    Personal history of colonic polyps    Renal lithiasis 06/26/2015   Sinus bradycardia    Thyroid disease    hypo   Vitamin B12 deficiency 03/16/2016   Vitamin D deficiency     Past Surgical History:  Procedure Laterality Date   BOTOX INJECTION N/A 09/03/2012   Procedure: BOTOX INJECTION;  Surgeon: Inda Castle, MD;  Location: [facility];  Service: Endoscopy;  Laterality: N/A;   BREAST BIOPSY Left    BREAST LUMPECTOMY Right 08/18/2014   Procedure: RIGHT BREAST LUMPECTOMY;  Surgeon: Coralie Keens, MD;  Location: [facility];  Service: General;  Laterality: Right;   CARDIAC ELECTROPHYSIOLOGY MAPPING AND ABLATION     CATARACT EXTRACTION, BILATERAL     ESOPHAGOGASTRODUODENOSCOPY N/A 09/03/2012   Procedure: ESOPHAGOGASTRODUODENOSCOPY (EGD);  Surgeon: Inda Castle, MD;  Location: [facility];  Service: Endoscopy;  Laterality: N/A;   ESOPHAGOGASTRODUODENOSCOPY (EGD) WITH PROPOFOL N/A 12/02/2018   Procedure: ESOPHAGOGASTRODUODENOSCOPY (EGD) WITH PROPOFOL;  Surgeon: [name], MD;  Location: [facility];  Service: Gastroenterology;  Laterality: N/A;   ESOPHAGOSCOPY W/ BOTOX INJECTION     I & D EXTREMITY Right 11/06/2012   Procedure: IRRIGATION AND DEBRIDEMENT EXTREMITY Right Ring Finger;  Surgeon: Jackolyn Confer  Fredna Dow, MD;  Location: [facility];  Service: Orthopedics;  Laterality: Right;   PARTIAL HYSTERECTOMY     ovaries left in place   skin cancer removal      Current Medications: No outpatient medications have been marked as taking for the 12/06/20 encounter (Appointment) with [name] Burn, PA-C.     Allergies:   Amlodipine, Darifenacin hydrobromide, Other, Sulfonamide derivatives, and Tramadol   Social History   Socioeconomic History   Marital status:  Widowed    Spouse name: Not on file   Number of children: 5   Years of education: Not on file   Highest education level: Not on file  Occupational History   Occupation: retired    Fish farm manager: RETIRED  Tobacco Use   Smoking status: Never   Smokeless tobacco: Never  Vaping Use   Vaping Use: Never used  Substance and Sexual Activity   Alcohol use: No   Drug use: No   Sexual activity: Not on file    Comment: lives with Daughter, grandson and his family. no dietary restricitons.  Other Topics Concern   Not on file  Social History Narrative   Not on file   Social Determinants of Health   Financial Resource Strain: Not on file  Food Insecurity: Not on file  Transportation Needs: Not on file  Physical Activity: Not on file  Stress: Not on file  Social Connections: Not on file     Family History:  The patient's ***family history includes Allergies in her daughter; COPD in her daughter, daughter, and father; CVA in her mother; Diabetes in her mother; Heart disease in her maternal aunt and maternal uncle; Rheum arthritis in her father; Stroke in her daughter, maternal grandfather, and mother.   ROS:   Please see the history of present illness.    ROS All other systems reviewed and are negative.   PHYSICAL EXAM:   VS:  There were no vitals taken for this visit.  Physical Exam  GEN: Well nourished, well developed, in no acute distress  HEENT: normal  Neck: no JVD, carotid bruits, or masses Cardiac:RRR; no murmurs, rubs, or gallops  Respiratory:  clear to auscultation bilaterally, normal work of breathing GI: soft, nontender, nondistended, + BS Ext: without cyanosis, clubbing, or edema, Good distal pulses bilaterally MS: no deformity or atrophy  Skin: warm and dry, no rash Neuro:  Alert and Oriented x 3, Strength and sensation are intact Psych: euthymic mood, full affect  Wt Readings from Last 3 Encounters:  10/24/20 87 lb (39.5 kg)  08/21/20 87 lb 4.8 oz (39.6 kg)  08/17/20  87 lb 3.2 oz (39.6 kg)      Studies/Labs Reviewed:   EKG:  EKG is*** ordered today.  The ekg ordered today demonstrates ***  Recent Labs: 10/24/2020: Magnesium 1.8; TSH 2.72 11/28/2020: ALT 9; BUN 18; Creatinine, Ser 1.00; Hemoglobin 10.2; Platelets 219.0; Potassium 3.8; Sodium 137   Lipid Panel    Component Value Date/Time   CHOL 117 10/24/2020 1518   TRIG 128.0 10/24/2020 1518   HDL 34.40 (L) 10/24/2020 1518   CHOLHDL 3 10/24/2020 1518   VLDL 25.6 10/24/2020 1518   LDLCALC 57 10/24/2020 1518   LDLCALC 72 10/29/2019 1358   LDLDIRECT 72.1 01/19/2009 1026    Additional studies/ records that were reviewed today include:  Monitor 09/2018 Study Highlights     Persistent atrial flutter with variable block with minimum HR 45, maximum HR 136 and average HR 65 BPM. 1 episode of ventricular tachycardia  lasting 8 beats.   Persistent atrial flutter with variable block with minimum HR 45, maximum HR 136 and average HR 65 BPM. Continue current medical management.      Echo 2018 Study Conclusions   - Left ventricle: The cavity size was normal. There was mild    concentric hypertrophy. Systolic function was normal. The    estimated ejection fraction was in the range of 55% to 60%. Wall    motion was normal; there were no regional wall motion    abnormalities. The study is not technically sufficient to allow    evaluation of LV diastolic function.  - Aortic valve: Transvalvular velocity was within the normal range.    There was no stenosis. There was no regurgitation.  - Mitral valve: Transvalvular velocity was within the normal range.    There was no evidence for stenosis. There was mild regurgitation.  - Left atrium: The atrium was moderately dilated.  - Right ventricle: Systolic function was normal.  - Atrial septum: No defect or patent foramen ovale was identified.  - Tricuspid valve: There was mild regurgitation.  - Pulmonary arteries: Systolic pressure was within the normal     range. PA peak pressure: 36 mm Hg (S).    Risk Assessment/Calculations:   {Does this patient have ATRIAL FIBRILLATION?:[phone]}     ASSESSMENT:    1. Persistent atrial fibrillation ([place])   2. Essential hypertension   3. Chest pain, unspecified type   4. Hyperlipidemia, unspecified hyperlipidemia type   5. Mild mitral regurgitation      PLAN:  In order of problems listed above:  Persistent Afib/aflutter on Eliquis with some bradycardia  HTN  History of chest pain-started on Imdur  HLD  Mild MR  Shared Decision Making/Informed Consent   {Are you ordering a CV Procedure (e.g. stress test, cath, DCCV, TEE, etc)?   Press F2        :[id number]}    Medication Adjustments/Labs and Tests Ordered: Current medicines are reviewed at length with the patient today.  Concerns regarding medicines are outlined above.  Medication changes, Labs and Tests ordered today are listed in the Patient Instructions below. There are no Patient Instructions on file for this visit.   Sumner Boast, PA-C  11/29/2020 8:03 AM    [facility] [place], [place], [place]  [address] Phone: [phone]; Fax: [phone]

## 2020-12-02 ENCOUNTER — Other Ambulatory Visit: Payer: Self-pay | Admitting: Family Medicine

## 2020-12-06 ENCOUNTER — Ambulatory Visit: Payer: Medicare Other | Admitting: Physician Assistant

## 2020-12-06 DIAGNOSIS — I4819 Other persistent atrial fibrillation: Secondary | ICD-10-CM

## 2020-12-06 DIAGNOSIS — E785 Hyperlipidemia, unspecified: Secondary | ICD-10-CM

## 2020-12-06 DIAGNOSIS — I10 Essential (primary) hypertension: Secondary | ICD-10-CM

## 2020-12-06 DIAGNOSIS — I340 Nonrheumatic mitral (valve) insufficiency: Secondary | ICD-10-CM

## 2020-12-06 DIAGNOSIS — R079 Chest pain, unspecified: Secondary | ICD-10-CM

## 2020-12-16 ENCOUNTER — Other Ambulatory Visit: Payer: Self-pay | Admitting: Family Medicine

## 2021-01-06 ENCOUNTER — Other Ambulatory Visit: Payer: Self-pay | Admitting: Family Medicine

## 2021-01-06 ENCOUNTER — Telehealth: Payer: Self-pay | Admitting: Family Medicine

## 2021-01-06 MED ORDER — MOLNUPIRAVIR EUA 200MG CAPSULE: 4.0000 | ORAL_CAPSULE | Freq: Two times a day (BID) | ORAL | 0 refills | Status: AC

## 2021-01-06 NOTE — Telephone Encounter (Signed)
Pt's daughter called and stated pt tested covid + yesterday, symptoms started Wednesday. Her symptoms include, head pressure, and mild fever (99.4). She stated pt feels like it is a mild cold and would like an antiviral. As we have no availability, they were wondering if they could get medication sent to a pharmacy.   [id number] [place], [place] - [address] [place] [address]  Phone:  [phone]  Fax:  [phone]

## 2021-01-16 ENCOUNTER — Ambulatory Visit (INDEPENDENT_AMBULATORY_CARE_PROVIDER_SITE_OTHER): Payer: Medicare Other | Admitting: Family Medicine

## 2021-01-16 ENCOUNTER — Encounter: Payer: Self-pay | Admitting: Family Medicine

## 2021-01-16 VITALS — BP 106/66 | HR 71 | Temp 98.1°F | Resp 16 | Ht 59.0 in | Wt 83.8 lb

## 2021-01-16 DIAGNOSIS — I480 Paroxysmal atrial fibrillation: Secondary | ICD-10-CM | POA: Diagnosis not present

## 2021-01-16 DIAGNOSIS — D649 Anemia, unspecified: Secondary | ICD-10-CM

## 2021-01-16 DIAGNOSIS — R059 Cough, unspecified: Secondary | ICD-10-CM

## 2021-01-16 DIAGNOSIS — E538 Deficiency of other specified B group vitamins: Secondary | ICD-10-CM

## 2021-01-16 DIAGNOSIS — E782 Mixed hyperlipidemia: Secondary | ICD-10-CM | POA: Diagnosis not present

## 2021-01-16 DIAGNOSIS — E039 Hypothyroidism, unspecified: Secondary | ICD-10-CM

## 2021-01-16 DIAGNOSIS — E559 Vitamin D deficiency, unspecified: Secondary | ICD-10-CM

## 2021-01-16 DIAGNOSIS — N289 Disorder of kidney and ureter, unspecified: Secondary | ICD-10-CM | POA: Diagnosis not present

## 2021-01-16 NOTE — Assessment & Plan Note (Signed)
Mild, stable, asymptomatic Supplement and monitor

## 2021-01-16 NOTE — Assessment & Plan Note (Signed)
Supplement and monitor 

## 2021-01-16 NOTE — Assessment & Plan Note (Signed)
Recently diagnosed with COVID but recovering well and never had severe symptoms. She will report if symptoms worsen again

## 2021-01-16 NOTE — Assessment & Plan Note (Signed)
Taking oral Vitamin B12 1000 daily now. Recheck levels in [redacted] weeks along with Intrinsic Factor and reevaluate plan after that

## 2021-01-16 NOTE — Progress Notes (Signed)
Subjective:    Patient ID: [name] [name], female    DOB: March 09, 1924, 88 y.o.   MRN: [id number]  Chief Complaint  Patient presents with   Follow-up    HPI Patient is in today for follow up accompanied by family and overall all she is doing well. No recent febrile illness or hospitalizations. She was recently diagnosed with COVID and had a dry cough and nasal congestion but is largely recovered now and denies any concerns. Is taking her vitamin B 12 daily by mouth. Denies CP/palp/SOB/HAfevers/GI or GU c/o. Taking meds as prescribed   Past Medical History:  Diagnosis Date   Anemia    Anxiety    Arthritis    hips, knees   Current use of long term anticoagulation    Diverticulitis of colon 02/06/2015   Gait difficulty    GERD (gastroesophageal reflux disease)    Headache(784.0) 06/22/2012   Hyperlipidemia    Hypertension    IBS (irritable bowel syndrome)    Incontinence    Loss of weight 06/05/2015   Medicare annual wellness visit, subsequent 02/05/2014   Melena    Mild mitral regurgitation    Mild tricuspid regurgitation    Mitral and aortic regurgitation    Mitral valve prolapse    hx of - not seen on recent echoes   Osteopenia    Paroxysmal atrial flutter ([place])    Pedal edema 10/01/2016   Persistent atrial fibrillation (HCC)    Personal history of colonic polyps    Renal lithiasis 06/26/2015   Sinus bradycardia    Thyroid disease    hypo   Vitamin B12 deficiency 03/16/2016   Vitamin D deficiency     Past Surgical History:  Procedure Laterality Date   BOTOX INJECTION N/A 09/03/2012   Procedure: BOTOX INJECTION;  Surgeon: Inda Castle, MD;  Location: [facility];  Service: Endoscopy;  Laterality: N/A;   BREAST BIOPSY Left    BREAST LUMPECTOMY Right 08/18/2014   Procedure: RIGHT BREAST LUMPECTOMY;  Surgeon: Coralie Keens, MD;  Location: [facility];  Service: General;  Laterality: Right;   CARDIAC ELECTROPHYSIOLOGY MAPPING AND ABLATION     CATARACT  EXTRACTION, BILATERAL     ESOPHAGOGASTRODUODENOSCOPY N/A 09/03/2012   Procedure: ESOPHAGOGASTRODUODENOSCOPY (EGD);  Surgeon: Inda Castle, MD;  Location: [facility];  Service: Endoscopy;  Laterality: N/A;   ESOPHAGOGASTRODUODENOSCOPY (EGD) WITH PROPOFOL N/A 12/02/2018   Procedure: ESOPHAGOGASTRODUODENOSCOPY (EGD) WITH PROPOFOL;  Surgeon: [name], MD;  Location: [facility];  Service: Gastroenterology;  Laterality: N/A;   ESOPHAGOSCOPY W/ BOTOX INJECTION     I & D EXTREMITY Right 11/06/2012   Procedure: IRRIGATION AND DEBRIDEMENT EXTREMITY Right Ring Finger;  Surgeon: Tennis Must, MD;  Location: [facility];  Service: Orthopedics;  Laterality: Right;   PARTIAL HYSTERECTOMY     ovaries left in place   skin cancer removal      Family History  Problem Relation Age of Onset   Diabetes Mother    CVA Mother    Stroke Mother    COPD Father    Rheum arthritis Father    Allergies Daughter    COPD Daughter    Stroke Daughter    COPD Daughter        previous smoker   Stroke Maternal Grandfather    Heart disease Maternal Aunt    Heart disease Maternal Uncle    Colon cancer Neg Hx    Colon polyps Neg Hx    Esophageal cancer Neg  Hx    Gallbladder disease Neg Hx    Kidney disease Neg Hx    Heart attack Neg Hx    Hypertension Neg Hx     Social History   Socioeconomic History   Marital status: Widowed    Spouse name: Not on file   Number of children: 5   Years of education: Not on file   Highest education level: Not on file  Occupational History   Occupation: retired    Fish farm manager: RETIRED  Tobacco Use   Smoking status: Never   Smokeless tobacco: Never  Vaping Use   Vaping Use: Never used  Substance and Sexual Activity   Alcohol use: No   Drug use: No   Sexual activity: Not on file    Comment: lives with Daughter, grandson and his family. no dietary restricitons.  Other Topics Concern   Not on file  Social History Narrative   Not on file   Social Determinants of  Health   Financial Resource Strain: Not on file  Food Insecurity: Not on file  Transportation Needs: Not on file  Physical Activity: Not on file  Stress: Not on file  Social Connections: Not on file  Intimate Partner Violence: Not on file    Outpatient Medications Prior to Visit  Medication Sig Dispense Refill   ALPRAZolam (XANAX) 0.25 MG tablet Take 0.5-1 tablets (0.125-0.25 mg total) by mouth 2 (two) times daily as needed for anxiety. 30 tablet 1   apixaban (ELIQUIS) 2.5 MG TABS tablet Take 1 tablet (2.5 mg total) by mouth 2 (two) times daily. 60 tablet 5   Cholecalciferol (VITAMIN D) 2000 UNITS CAPS Take 2,000 Units by mouth every other day.     famotidine (PEPCID) 20 MG tablet Take 40 mg by mouth at bedtime.      Ferrous Fumarate-Folic Acid (HEMOCYTE-F) 324-1 MG TABS Take 1 tablet by mouth daily. 30 tablet 5   hydrALAZINE (APRESOLINE) 10 MG tablet TAKE 1 TABLET BY MOUTH THREE TIMES A DAY 270 tablet 1   isosorbide mononitrate (IMDUR) 30 MG 24 hr tablet Take 1 tablet (30 mg total) by mouth daily. 90 tablet 3   levocetirizine (XYZAL) 5 MG tablet TAKE 1/2 TABLET (2.5 MG TOTAL) BY MOUTH EVERY EVENING. 45 tablet 2   metoprolol succinate (TOPROL XL) 25 MG 24 hr tablet Take 1 tablet (25 mg total) by mouth daily. 90 tablet 1   pantoprazole (PROTONIX) 40 MG tablet TAKE 1 TABLET BY MOUTH EVERY DAY 90 tablet 1   No facility-administered medications prior to visit.    Allergies  Allergen Reactions   Amlodipine Shortness Of Breath   Darifenacin Hydrobromide Other (See Comments)    dizziness   Other Other (See Comments)    dizziness   Sulfonamide Derivatives Other (See Comments)    dizziness   Tramadol Other (See Comments)    Insomnia, anorexia    Review of Systems  Constitutional:  Negative for fever and malaise/fatigue.  HENT:  Negative for congestion.   Eyes:  Negative for blurred vision.  Respiratory:  Negative for shortness of breath.   Cardiovascular:  Negative for chest pain,  palpitations and leg swelling.  Gastrointestinal:  Negative for abdominal pain, blood in stool and nausea.  Genitourinary:  Negative for dysuria and frequency.  Musculoskeletal:  Negative for falls.  Skin:  Negative for rash.  Neurological:  Negative for dizziness, loss of consciousness and headaches.  Endo/Heme/Allergies:  Negative for environmental allergies.  Psychiatric/Behavioral:  Negative for depression. The patient is  not nervous/anxious.       Objective:    Physical Exam Constitutional:      General: She is not in acute distress.    Appearance: She is well-developed.  HENT:     Head: Normocephalic and atraumatic.  Eyes:     Conjunctiva/sclera: Conjunctivae normal.  Neck:     Thyroid: No thyromegaly.  Cardiovascular:     Rate and Rhythm: Normal rate and regular rhythm.     Heart sounds: Normal heart sounds. No murmur heard. Pulmonary:     Effort: Pulmonary effort is normal. No respiratory distress.     Breath sounds: Normal breath sounds.  Abdominal:     General: Bowel sounds are normal. There is no distension.     Palpations: Abdomen is soft. There is no mass.     Tenderness: There is no abdominal tenderness.  Musculoskeletal:     Cervical back: Neck supple.  Lymphadenopathy:     Cervical: No cervical adenopathy.  Skin:    General: Skin is warm and dry.  Neurological:     Mental Status: She is alert and oriented to person, place, and time.  Psychiatric:        Behavior: Behavior normal.    BP 106/66    Pulse 71    Temp 98.1 F (36.7 C)    Resp 16    Ht 4\' 11"  (1.499 m)    Wt 83 lb 12.8 oz (38 kg)    SpO2 94%    BMI 16.93 kg/m  Wt Readings from Last 3 Encounters:  01/16/21 83 lb 12.8 oz (38 kg)  10/24/20 87 lb (39.5 kg)  08/21/20 87 lb 4.8 oz (39.6 kg)    Diabetic Foot Exam - Simple   No data filed    Lab Results  Component Value Date   WBC 7.1 11/28/2020   HGB 10.2 (L) 11/28/2020   HCT 30.4 (L) 11/28/2020   PLT 219.0 11/28/2020   GLUCOSE 106 (H)  11/28/2020   CHOL 117 10/24/2020   TRIG 128.0 10/24/2020   HDL 34.40 (L) 10/24/2020   LDLDIRECT 72.1 01/19/2009   LDLCALC 57 10/24/2020   ALT 9 11/28/2020   AST 15 11/28/2020   NA 137 11/28/2020   K 3.8 11/28/2020   CL 100 11/28/2020   CREATININE 1.00 11/28/2020   BUN 18 11/28/2020   CO2 28 11/28/2020   TSH 2.72 10/24/2020   INR 2.1 04/25/2018   HGBA1C 5.5 10/24/2020    Lab Results  Component Value Date   TSH 2.72 10/24/2020   Lab Results  Component Value Date   WBC 7.1 11/28/2020   HGB 10.2 (L) 11/28/2020   HCT 30.4 (L) 11/28/2020   MCV 90.1 11/28/2020   PLT 219.0 11/28/2020   Lab Results  Component Value Date   NA 137 11/28/2020   K 3.8 11/28/2020   CO2 28 11/28/2020   GLUCOSE 106 (H) 11/28/2020   BUN 18 11/28/2020   CREATININE 1.00 11/28/2020   BILITOT 0.4 11/28/2020   ALKPHOS 15 (L) 11/28/2020   AST 15 11/28/2020   ALT 9 11/28/2020   PROT 6.7 11/28/2020   ALBUMIN 3.2 (L) 11/28/2020   CALCIUM 8.5 11/28/2020   ANIONGAP 11 08/17/2020   GFR 48.10 (L) 11/28/2020   Lab Results  Component Value Date   CHOL 117 10/24/2020   Lab Results  Component Value Date   HDL 34.40 (L) 10/24/2020   Lab Results  Component Value Date   LDLCALC 57 10/24/2020   Lab  Results  Component Value Date   TRIG 128.0 10/24/2020   Lab Results  Component Value Date   CHOLHDL 3 10/24/2020   Lab Results  Component Value Date   HGBA1C 5.5 10/24/2020       Assessment & Plan:   Problem List Items Addressed This Visit     Hypothyroidism    Supplement and monitor      Vitamin D deficiency    Supplement and monitor      Hyperlipidemia, mixed    Encourage heart healthy diet such as MIND or DASH diet, increase exercise, avoid trans fats, simple carbohydrates and processed foods, consider a krill or fish or flaxseed oil cap daily.       Anemia    Mild, stable, asymptomatic Supplement and monitor      Renal insufficiency    Hydrate and monitor      Vitamin B12  deficiency - Primary    Taking oral Vitamin B12 1000 daily now. Recheck levels in [redacted] weeks along with Intrinsic Factor and reevaluate plan after that      Relevant Orders   Vitamin B12   Intrinsic Factor Antibodies   Cough    Recently diagnosed with COVID but recovering well and never had severe symptoms. She will report if symptoms worsen again      PAF (paroxysmal atrial fibrillation) (HCC)    RRR today       I am having [name] [name] maintain her Vitamin D, famotidine, apixaban, isosorbide mononitrate, metoprolol succinate, levocetirizine, pantoprazole, hydrALAZINE, ALPRAZolam, and Hemocyte-F.  No orders of the defined types were placed in this encounter.    Penni Homans, MD

## 2021-01-16 NOTE — Assessment & Plan Note (Signed)
RRR today 

## 2021-01-16 NOTE — Assessment & Plan Note (Signed)
Encourage heart healthy diet such as MIND or DASH diet, increase exercise, avoid trans fats, simple carbohydrates and processed foods, consider a krill or fish or flaxseed oil cap daily.  °

## 2021-01-16 NOTE — Patient Instructions (Signed)
Vitamin B12 Deficiency ?Vitamin B12 deficiency occurs when the body does not have enough vitamin B12, which is an important vitamin. The body needs this vitamin: ?To make red blood cells. ?To make DNA. This is the genetic material inside cells. ?To help the nerves work properly so they can carry messages from the brain to the body. ?Vitamin B12 deficiency can cause various health problems, such as a low red blood cell count (anemia) or nerve damage. ?What are the causes? ?This condition may be caused by: ?Not eating enough foods that contain vitamin B12. ?Not having enough stomach acid and digestive fluids to properly absorb vitamin B12 from the food that you eat. ?Certain digestive system diseases that make it hard to absorb vitamin B12. These diseases include Crohn's disease, chronic pancreatitis, and cystic fibrosis. ?A condition in which the body does not make enough of a protein (intrinsic factor), resulting in too few red blood cells (pernicious anemia). ?Having a surgery in which part of the stomach or small intestine is removed. ?Taking certain medicines that make it hard for the body to absorb vitamin B12. These medicines include: ?Heartburn medicines (antacids and proton pump inhibitors). ?Certain antibiotic medicines. ?Some medicines that are used to treat diabetes, tuberculosis, gout, or high cholesterol. ?What increases the risk? ?The following factors may make you more likely to develop a B12 deficiency: ?Being older than age 50. ?Eating a vegetarian or vegan diet, especially while you are pregnant. ?Eating a poor diet while you are pregnant. ?Taking certain medicines. ?Having alcoholism. ?What are the signs or symptoms? ?In some cases, there are no symptoms of this condition. If the condition leads to anemia or nerve damage, various symptoms can occur, such as: ?Weakness. ?Fatigue. ?Loss of appetite. ?Weight loss. ?Numbness or tingling in your hands and feet. ?Redness and burning of the  tongue. ?Confusion or memory problems. ?Depression. ?Sensory problems, such as color blindness, ringing in the ears, or loss of taste. ?Diarrhea or constipation. ?Trouble walking. ?If anemia is severe, symptoms can include: ?Shortness of breath. ?Dizziness. ?Rapid heart rate (tachycardia). ?How is this diagnosed? ?This condition may be diagnosed with a blood test to measure the level of vitamin B12 in your blood. You may also have other tests, including: ?A group of tests that measure certain characteristics of blood cells (complete blood count, CBC). ?A blood test to measure intrinsic factor. ?A procedure where a thin tube with a camera on the end is used to look into your stomach or intestines (endoscopy). ?Other tests may be needed to discover the cause of B12 deficiency. ?How is this treated? ?Treatment for this condition depends on the cause. This condition may be treated by: ?Changing your eating and drinking habits, such as: ?Eating more foods that contain vitamin B12. ?Drinking less alcohol or no alcohol. ?Getting vitamin B12 injections. ?Taking vitamin B12 supplements. Your health care provider will tell you which dosage is best for you. ?Follow these instructions at home: ?Eating and drinking ? ?Eat lots of healthy foods that contain vitamin B12, including: ?Meats and poultry. This includes beef, pork, chicken, turkey, and organ meats, such as liver. ?Seafood. This includes clams, rainbow trout, salmon, tuna, and haddock. ?Eggs. ?Cereal and dairy products that are fortified. This means that vitamin B12 has been added to the food. Check the label on the package to see if the food is fortified. ?The items listed above may not be a complete list of recommended foods and beverages. Contact a dietitian for more information. ?General instructions ?Get any   injections that are prescribed by your health care provider. ?Take supplements only as told by your health care provider. Follow the directions carefully. ?Do  not drink alcohol if your health care provider tells you not to. In some cases, you may only be asked to limit alcohol use. ?Keep all follow-up visits as told by your health care provider. This is important. ?Contact a health care provider if: ?Your symptoms come back. ?Get help right away if you: ?Develop shortness of breath. ?Have a rapid heart rate. ?Have chest pain. ?Become dizzy or lose consciousness. ?Summary ?Vitamin B12 deficiency occurs when the body does not have enough vitamin B12. ?The main causes of vitamin B12 deficiency include dietary deficiency, digestive diseases, pernicious anemia, and having a surgery in which part of the stomach or small intestine is removed. ?In some cases, there are no symptoms of this condition. If the condition leads to anemia or nerve damage, various symptoms can occur, such as weakness, shortness of breath, and numbness. ?Treatment may include getting vitamin B12 injections or taking vitamin B12 supplements. Eat lots of healthy foods that contain vitamin B12. ?This information is not intended to replace advice given to you by your health care provider. Make sure you discuss any questions you have with your health care provider. ?Document Revised: 06/15/2020 Document Reviewed: 08/27/2017 ?Elsevier Patient Education ? 2022 [employer] ? ?

## 2021-01-16 NOTE — Assessment & Plan Note (Signed)
Hydrate and monitor 

## 2021-01-30 ENCOUNTER — Other Ambulatory Visit: Payer: Self-pay | Admitting: *Deleted

## 2021-01-30 MED ORDER — METOPROLOL SUCCINATE ER 25 MG PO TB24: 25.0000 mg | ORAL_TABLET | Freq: Every day | ORAL | 0 refills | Status: DC

## 2021-02-01 ENCOUNTER — Other Ambulatory Visit (INDEPENDENT_AMBULATORY_CARE_PROVIDER_SITE_OTHER): Payer: Medicare Other

## 2021-02-01 DIAGNOSIS — E538 Deficiency of other specified B group vitamins: Secondary | ICD-10-CM

## 2021-02-01 LAB — VITAMIN B12: Vitamin B-12: 594 pg/mL (ref 211–911)

## 2021-02-05 LAB — INTRINSIC FACTOR ANTIBODIES: Intrinsic Factor: NEGATIVE

## 2021-02-13 ENCOUNTER — Other Ambulatory Visit: Payer: Self-pay

## 2021-02-13 MED ORDER — METOPROLOL SUCCINATE ER 25 MG PO TB24: 25.0000 mg | ORAL_TABLET | Freq: Every day | ORAL | 0 refills | Status: DC

## 2021-03-10 ENCOUNTER — Other Ambulatory Visit: Payer: Self-pay | Admitting: Family Medicine

## 2021-03-10 ENCOUNTER — Other Ambulatory Visit: Payer: Self-pay

## 2021-03-10 ENCOUNTER — Other Ambulatory Visit: Payer: Self-pay | Admitting: Family

## 2021-03-10 DIAGNOSIS — R079 Chest pain, unspecified: Secondary | ICD-10-CM

## 2021-03-10 MED ORDER — ISOSORBIDE MONONITRATE ER 30 MG PO TB24: 30.0000 mg | ORAL_TABLET | Freq: Every day | ORAL | 0 refills | Status: DC

## 2021-03-22 ENCOUNTER — Encounter (HOSPITAL_BASED_OUTPATIENT_CLINIC_OR_DEPARTMENT_OTHER): Payer: Self-pay

## 2021-03-22 ENCOUNTER — Telehealth: Payer: Self-pay

## 2021-03-22 ENCOUNTER — Emergency Department (HOSPITAL_BASED_OUTPATIENT_CLINIC_OR_DEPARTMENT_OTHER): Payer: Medicare Other

## 2021-03-22 ENCOUNTER — Emergency Department (HOSPITAL_BASED_OUTPATIENT_CLINIC_OR_DEPARTMENT_OTHER)
Admission: EM | Admit: 2021-03-22 | Discharge: 2021-03-22 | Disposition: A | Discharge status: Home / Self Care | Payer: Medicare Other | Attending: Emergency Medicine | Admitting: Emergency Medicine

## 2021-03-22 ENCOUNTER — Other Ambulatory Visit: Payer: Self-pay

## 2021-03-22 DIAGNOSIS — M7989 Other specified soft tissue disorders: Secondary | ICD-10-CM | POA: Diagnosis not present

## 2021-03-22 DIAGNOSIS — R350 Frequency of micturition: Secondary | ICD-10-CM | POA: Insufficient documentation

## 2021-03-22 DIAGNOSIS — Z79899 Other long term (current) drug therapy: Secondary | ICD-10-CM | POA: Diagnosis not present

## 2021-03-22 DIAGNOSIS — Z7901 Long term (current) use of anticoagulants: Secondary | ICD-10-CM | POA: Insufficient documentation

## 2021-03-22 DIAGNOSIS — N289 Disorder of kidney and ureter, unspecified: Secondary | ICD-10-CM

## 2021-03-22 DIAGNOSIS — R2243 Localized swelling, mass and lump, lower limb, bilateral: Secondary | ICD-10-CM | POA: Diagnosis not present

## 2021-03-22 DIAGNOSIS — I10 Essential (primary) hypertension: Secondary | ICD-10-CM | POA: Insufficient documentation

## 2021-03-22 LAB — BASIC METABOLIC PANEL
Anion gap: 8 (ref 5–15)
BUN: 20 mg/dL (ref 8–23)
CO2: 26 mmol/L (ref 22–32)
Calcium: 8.6 mg/dL — ABNORMAL LOW (ref 8.9–10.3)
Chloride: 104 mmol/L (ref 98–111)
Creatinine, Ser: 1.27 mg/dL — ABNORMAL HIGH (ref 0.44–1.00)
GFR, Estimated: 39 mL/min — ABNORMAL LOW (ref >60)
Glucose, Bld: 101 mg/dL — ABNORMAL HIGH (ref 70–99)
Potassium: 4 mmol/L (ref 3.5–5.1)
Sodium: 138 mmol/L (ref 135–145)

## 2021-03-22 LAB — CBC WITH DIFFERENTIAL/PLATELET
Abs Immature Granulocytes: 0.02 10*3/uL (ref 0.00–0.07)
Basophils Absolute: 0 10*3/uL (ref 0.0–0.1)
Basophils Relative: 0 %
Eosinophils Absolute: 0 10*3/uL (ref 0.0–0.5)
Eosinophils Relative: 0 %
HCT: 32.4 % — ABNORMAL LOW (ref 36.0–46.0)
Hemoglobin: 10.3 g/dL — ABNORMAL LOW (ref 12.0–15.0)
Immature Granulocytes: 0 %
Lymphocytes Relative: 21 %
Lymphs Abs: 1.5 10*3/uL (ref 0.7–4.0)
MCH: 29.2 pg (ref 26.0–34.0)
MCHC: 31.8 g/dL (ref 30.0–36.0)
MCV: 91.8 fL (ref 80.0–100.0)
Monocytes Absolute: 0.4 10*3/uL (ref 0.1–1.0)
Monocytes Relative: 5 %
Neutro Abs: 5.3 10*3/uL (ref 1.7–7.7)
Neutrophils Relative %: 74 %
Platelets: 244 10*3/uL (ref 150–400)
RBC: 3.53 MIL/uL — ABNORMAL LOW (ref 3.87–5.11)
RDW: 15.8 % — ABNORMAL HIGH (ref 11.5–15.5)
WBC: 7.2 10*3/uL (ref 4.0–10.5)
nRBC: 0 % (ref 0.0–0.2)

## 2021-03-22 LAB — URINALYSIS, ROUTINE W REFLEX MICROSCOPIC
Bilirubin Urine: NEGATIVE
Glucose, UA: NEGATIVE mg/dL
Hgb urine dipstick: NEGATIVE
Ketones, ur: NEGATIVE mg/dL
Nitrite: NEGATIVE
Protein, ur: NEGATIVE mg/dL
Specific Gravity, Urine: 1.02 (ref 1.005–1.030)
pH: 6.5 (ref 5.0–8.0)

## 2021-03-22 LAB — URINALYSIS, MICROSCOPIC (REFLEX)

## 2021-03-22 NOTE — Telephone Encounter (Signed)
Pt's Daughter called with pt having symptoms of frequent urination and dizziness.  She was also concerned about possible dehydration.  Call was routed to Triage for further evaluation. ?

## 2021-03-22 NOTE — Telephone Encounter (Signed)
Nurse Assessment ?Nurse: Rolin Barry, RN, Levada Dy Date/Time Eilene Ghazi Time): 03/22/2021 10:51:34 AM ?Confirm and document reason for call. If ?symptomatic, describe symptoms. ?---Caller is calling for her mother who may be ?dehydrated, caller states that the patient is urinating ?a lot, dizziness. Ankles are also swollen. Dizziness ?started last night. No temp. ?Does the patient have any new or worsening ?symptoms? ---Yes ?Will a triage be completed? ---Yes ?Related visit to physician within the last 2 weeks? ---No ?Does the PT have any chronic conditions? (i.e. ?diabetes, asthma, this includes High risk factors for ?pregnancy, etc.) ?---Yes ?List chronic conditions. ---HTN A fib allergy ?Is this a behavioral health or substance abuse call? ---No ?Guidelines ?Guideline Title Affirmed Question Affirmed Notes Nurse Date/Time (Eastern ?Time) ?Dizziness - ?Lightheadedness ?SEVERE dizziness ?(e.g., unable to stand, ?requires support ?to walk, feels like ?passing out now) ?Rolin Barry, RN, Levada Dy 03/22/2021 10:53:03 ?AM ?Disp. Time (Eastern ?Time) Disposition Final User ?PLEASE NOTE: All timestamps contained within this report are represented as [place] Standard Time. ?CONFIDENTIALTY NOTICE: This fax transmission is intended only for the addressee. It contains information that is legally privileged, confidential or ?otherwise protected from use or disclosure. If you are not the intended recipient, you are strictly prohibited from reviewing, disclosing, copying using ?or disseminating any of this information or taking any action in reliance on or regarding this information. If you have received this fax in error, please ?notify us immediately by telephone so that we can arrange for its return to [place]. Phone: [phone], Toll-Free: [phone], Fax: [phone] ?Page: 2 of 2 ?Call Id: [id number] ?03/22/2021 10:56:15 AM Go to ED Now (or PCP triage) Yes Deaton, RN, Levada Dy ?Caller Disagree/Comply Comply ?Caller Understands Yes ?PreDisposition  Did not know what to do ?Care Advice Given Per Guideline ?GO TO ED NOW (OR PCP TRIAGE): ANOTHER ADULT SHOULD DRIVE: * It is better and safer if another adult drives instead ?of you. BRING MEDICINES: CARE ADVICE given per Dizziness (Adult) guideline. ?Referrals ?[facility] - ED ?

## 2021-03-22 NOTE — Telephone Encounter (Signed)
Pt went to the ED 

## 2021-03-22 NOTE — Discharge Instructions (Signed)
Work-up for the leg swelling without any deep vein thrombosis.  Urinalysis negative other than did have some white blood cells.  Urine sent for culture.  But no significant amounts of bacteria however it does grow anything of significance you will be contacted. ? ?Blood pressure elevated here not sure if this is a trend or just high for today recommend following up with primary care doctor to have that rechecked.  Creatinine in the blood was a little elevated which could mean that there is a little bit of dehydration or may be a little worsening renal function.  This needs to be followed up by primary care doctor as well. ? ?Doppler studies of both legs without any evidence of blood clots. ? ?Return for any new or worse symptoms ?

## 2021-03-22 NOTE — ED Notes (Signed)
Pt discharged to home. Discharge instructions have been discussed with patient and/or family members. Pt verbally acknowledges understanding d/c instructions, and endorses comprehension to checkout at registration before leaving.  °

## 2021-03-22 NOTE — ED Provider Notes (Addendum)
?[facility]ERGENC[facility] DEPARTMENT ?Provider Note ? ? ?CSN: [id number] ?Arrival date & time: 03/22/21  1134 ? ?  ? ?History ? ?Chief Complaint  ?Patient presents with  ? Multiple c/o  ? ? ?[name] [name] is a 88 y.o. female. ? ?Patient was feeling dizzy earlier this morning but that has resolved.  Patient's also had increased urination and bilateral lower extremity swelling right greater than left patient's daughter states that that has been getting worse.  Denies any shortness of breath chest pain abdominal pain nausea or vomiting. ? ?Past medical history is significant for history of atrial fibrillation patient is on Eliquis.  And a history of hypertension.  Patient is also on Toprol XL.  History also significant for mitral and aortic regurg mitral valve prolapse irritable bowel syndrome gastroesophageal reflux disease thyroid disease hypo-.  Hyperlipidemia hypertension mild mitral regurgitation mild tricuspid regurgitation persistent atrial fibrillation. ? ? ?  ? ?Home Medications ?Prior to Admission medications   ?Medication Sig Start Date End Date Taking? Authorizing Provider  ?ALPRAZolam (XANAX) 0.25 MG tablet Take 0.5-1 tablets (0.125-0.25 mg total) by mouth 2 (two) times daily as needed for anxiety. 10/24/20   Mosie Lukes, MD  ?apixaban (ELIQUIS) 2.5 MG TABS tablet Take 1 tablet (2.5 mg total) by mouth 2 (two) times daily. 04/25/20   Imogene Burn, PA-C  ?Cholecalciferol (VITAMIN D) 2000 UNITS CAPS Take 2,000 Units by mouth every other day. 02/20/12   Mosie Lukes, MD  ?famotidine (PEPCID) 20 MG tablet Take 40 mg by mouth at bedtime.     [provider]  ?Ferrous Fumarate-Folic Acid (HEMOCYTE-F) 324-1 MG TABS Take 1 tablet by mouth daily. 11/29/20   Mosie Lukes, MD  ?hydrALAZINE (APRESOLINE) 10 MG tablet TAKE 1 TABLET BY MOUTH THREE TIMES A DAY 08/29/20   Mosie Lukes, MD  ?isosorbide mononitrate (IMDUR) 30 MG 24 hr tablet Take 1 tablet (30 mg total) by mouth daily. Please keep  upcoming appt in April 2023 with Dr. Johney Frame before anymore refills. Thank you Final Attempt 03/10/21   [name], MD  ?levocetirizine (XYZAL) 5 MG tablet TAKE 1/2 TABLET (2.5 MG TOTAL) BY MOUTH EVERY EVENING. 03/10/21   Mosie Lukes, MD  ?metoprolol succinate (TOPROL XL) 25 MG 24 hr tablet Take 1 tablet (25 mg total) by mouth daily. 02/13/21   [name], MD  ?pantoprazole (PROTONIX) 40 MG tablet TAKE 1 TABLET BY MOUTH EVERY DAY 03/10/21   Mosie Lukes, MD  ?   ? ?Allergies    ?Amlodipine, Darifenacin hydrobromide, Other, Sulfonamide derivatives, and Tramadol   ? ?Review of Systems   ?Review of Systems  ?Constitutional:  Negative for chills and fever.  ?HENT:  Negative for ear pain and sore throat.   ?Eyes:  Negative for pain and visual disturbance.  ?Respiratory:  Negative for cough and shortness of breath.   ?Cardiovascular:  Positive for leg swelling. Negative for chest pain and palpitations.  ?Gastrointestinal:  Negative for abdominal pain and vomiting.  ?Genitourinary:  Positive for frequency. Negative for dysuria and hematuria.  ?Musculoskeletal:  Negative for arthralgias and back pain.  ?Skin:  Negative for color change and rash.  ?Neurological:  Positive for dizziness. Negative for seizures and syncope.  ?All other systems reviewed and are negative. ? ?Physical Exam ?Updated Vital Signs ?BP (!) 200/71 (BP Location: Left Arm)   Pulse 85   Temp 97.6 ?F (36.4 ?C) (Oral)   Resp 18   Wt 41.4 kg  SpO2 98%   BMI 18.42 kg/m?  ?Physical Exam ?Vitals and nursing note reviewed.  ?Constitutional:   ?   General: She is not in acute distress. ?   Appearance: Normal appearance. She is well-developed.  ?HENT:  ?   Head: Normocephalic and atraumatic.  ?Eyes:  ?   Extraocular Movements: Extraocular movements intact.  ?   Conjunctiva/sclera: Conjunctivae normal.  ?   Pupils: Pupils are equal, round, and reactive to light.  ?Cardiovascular:  ?   Rate and Rhythm: Normal rate and regular rhythm.   ?   Heart sounds: No murmur heard. ?Pulmonary:  ?   Effort: Pulmonary effort is normal. No respiratory distress.  ?   Breath sounds: Normal breath sounds.  ?Abdominal:  ?   General: There is no distension.  ?   Palpations: Abdomen is soft.  ?   Tenderness: There is no abdominal tenderness.  ?Musculoskeletal:     ?   General: No swelling.  ?   Cervical back: Normal range of motion and neck supple. No rigidity.  ?   Right lower leg: Edema present.  ?   Left lower leg: Edema present.  ?   Comments: Bilateral lower extremity swelling right greater than left.  ?Skin: ?   General: Skin is warm and dry.  ?   Capillary Refill: Capillary refill takes less than 2 seconds.  ?Neurological:  ?   General: No focal deficit present.  ?   Mental Status: She is alert and oriented to person, place, and time.  ?   Cranial Nerves: No cranial nerve deficit.  ?   Sensory: No sensory deficit.  ?   Motor: No weakness.  ?   Coordination: Coordination normal.  ?Psychiatric:     ?   Mood and Affect: Mood normal.  ? ? ?ED Results / Procedures / Treatments   ?Labs ?(all labs ordered are listed, but only abnormal results are displayed) ?Labs Reviewed  ?CBC WITH DIFFERENTIAL/PLATELET  ?BASIC METABOLIC PANEL  ?URINALYSIS, ROUTINE W REFLEX MICROSCOPIC  ? ? ?EKG ?EKG Interpretation ? ?Date/Time:  Wednesday March 22 2021 11:51:24 EDT ?Ventricular Rate:  86 ?PR Interval:    ?QRS Duration: 89 ?QT Interval:  394 ?QTC Calculation: 472 ?R Axis:   22 ?Text Interpretation: Atrial fibrillation Anterior infarct, old Borderline ST depression, diffuse leads Confirmed by Fredia Sorrow [id number]) on 03/22/2021 12:11:33 PM ? ?Radiology ?No results found. ? ?Procedures ?Procedures  ?Cardiac monitor consistent with atrial fibrillation rate controlled. ? ?Medications Ordered in ED ?Medications - No data to display ? ?ED Course/ Medical Decision Making/ A&P ?  ?                        ?Medical Decision Making ?Amount and/or Complexity of Data Reviewed ?Labs:  ordered. ? ? ?Patient's dizziness is resolved.  Main complaint is urinary frequency and the increasing bilateral lower extremity swelling right greater than left.  No chest pain no shortness of breath no abdominal pain no strokelike symptoms. ? ?Get bilateral Doppler ultrasounds.  We will get basic labs.  We will get urinalysis ? ?EKG shows atrial fibrillation with patient is known to have a history of.  It is rate controlled. ? ?Doppler studies without evidence of any deep vein thrombosis.  Urinalysis negative other than some leukocytes.  Very few bacteria.  Urine sent for culture.  Do not think it is consistent with urinary tract infection.  No leukocytosis on CBC hemoglobin down a little bit  at 10.3.  Basic metabolic panel significant for creatinine of 1.27 worse than baseline.  Giving a GFR of 39.  Patient will need to have this followed closely by her primary care doctor.  This is a little worse than normal.  Potassium was fine at 4.0. ? ?In addition patient states she is taking her Eliquis and her Toprol-XL.  Blood pressures systolics are elevated here today.  Patient's dizziness is completely resolved.  I do not think we need to pursue the elevated blood pressure or that symptom more aggressively today.  But would recommend close follow-up with her primary care doctor to have blood pressures trended. ? ?Lives with her daughter daughter will return for any new or worse symptoms. ? ? ? ?Final Clinical Impression(s) / ED Diagnoses ?Final diagnoses:  ?Urinary frequency  ?Leg swelling  ? ? ?Rx / DC Orders ?ED Discharge Orders   ? ? None  ? ?  ? ? ?  ?Fredia Sorrow, MD ?03/22/21 1244 ? ?  ?Fredia Sorrow, MD ?03/22/21 1458 ? ?

## 2021-03-22 NOTE — ED Notes (Signed)
Called lab @ 1343 for add-on urine culture.  ?

## 2021-03-22 NOTE — ED Notes (Signed)
Hematoma at IV site, site flushes with no pain noted.  ?

## 2021-03-22 NOTE — ED Triage Notes (Signed)
Pt c/o dizziness last night, increased urination "for a while"-bilat LE swelling x 1 month-NAD- to triage with own rollator ?

## 2021-03-23 LAB — URINE CULTURE

## 2021-03-23 NOTE — Progress Notes (Signed)
? ?Subjective:  ? ? Patient ID: [name] [name], female    DOB: January 20, 1924, 88 y.o.   MRN: [id number] ? ?No chief complaint on file. ? ? ?HPI ?Patient is in today for a hospital followup. ? ?Past Medical History:  ?Diagnosis Date  ? Anemia   ? Anxiety   ? Arthritis   ? hips, knees  ? Current use of long term anticoagulation   ? Diverticulitis of colon 02/06/2015  ? Gait difficulty   ? GERD (gastroesophageal reflux disease)   ? Headache(784.0) 06/22/2012  ? Hyperlipidemia   ? Hypertension   ? IBS (irritable bowel syndrome)   ? Incontinence   ? Loss of weight 06/05/2015  ? Medicare annual wellness visit, subsequent 02/05/2014  ? Melena   ? Mild mitral regurgitation   ? Mild tricuspid regurgitation   ? Mitral and aortic regurgitation   ? Mitral valve prolapse   ? hx of - not seen on recent echoes  ? Osteopenia   ? Paroxysmal atrial flutter ([place])   ? Pedal edema 10/01/2016  ? Persistent atrial fibrillation ([place])   ? Personal history of colonic polyps   ? Renal lithiasis 06/26/2015  ? Sinus bradycardia   ? Thyroid disease   ? hypo  ? Vitamin B12 deficiency 03/16/2016  ? Vitamin D deficiency   ? ? ?Past Surgical History:  ?Procedure Laterality Date  ? BOTOX INJECTION N/A 09/03/2012  ? Procedure: BOTOX INJECTION;  Surgeon: Inda Castle, MD;  Location: [facility];  Service: Endoscopy;  Laterality: N/A;  ? BREAST BIOPSY Left   ? BREAST LUMPECTOMY Right 08/18/2014  ? Procedure: RIGHT BREAST LUMPECTOMY;  Surgeon: Coralie Keens, MD;  Location: [facility];  Service: General;  Laterality: Right;  ? CARDIAC ELECTROPHYSIOLOGY MAPPING AND ABLATION    ? CATARACT EXTRACTION, BILATERAL    ? ESOPHAGOGASTRODUODENOSCOPY N/A 09/03/2012  ? Procedure: ESOPHAGOGASTRODUODENOSCOPY (EGD);  Surgeon: Inda Castle, MD;  Location: [facility];  Service: Endoscopy;  Laterality: N/A;  ? ESOPHAGOGASTRODUODENOSCOPY (EGD) WITH PROPOFOL N/A 12/02/2018  ? Procedure: ESOPHAGOGASTRODUODENOSCOPY (EGD) WITH PROPOFOL;  Surgeon: [name], MD;   Location: [facility];  Service: Gastroenterology;  Laterality: N/A;  ? ESOPHAGOSCOPY W/ BOTOX INJECTION    ? I & D EXTREMITY Right 11/06/2012  ? Procedure: IRRIGATION AND DEBRIDEMENT EXTREMITY Right Ring Finger;  Surgeon: Tennis Must, MD;  Location: [facility];  Service: Orthopedics;  Laterality: Right;  ? PARTIAL HYSTERECTOMY    ? ovaries left in place  ? skin cancer removal    ? ? ?Family History  ?Problem Relation Age of Onset  ? Diabetes Mother   ? CVA Mother   ? Stroke Mother   ? COPD Father   ? Rheum arthritis Father   ? Allergies Daughter   ? COPD Daughter   ? Stroke Daughter   ? COPD Daughter   ?     previous smoker  ? Stroke Maternal Grandfather   ? Heart disease Maternal Aunt   ? Heart disease Maternal Uncle   ? Colon cancer Neg Hx   ? Colon polyps Neg Hx   ? Esophageal cancer Neg Hx   ? Gallbladder disease Neg Hx   ? Kidney disease Neg Hx   ? Heart attack Neg Hx   ? Hypertension Neg Hx   ? ? ?Social History  ? ?Socioeconomic History  ? Marital status: Widowed  ?  Spouse name: Not on file  ? Number of children: 5  ? Years of education:  Not on file  ? Highest education level: Not on file  ?Occupational History  ? Occupation: retired  ?  Employer: RETIRED  ?Tobacco Use  ? Smoking status: Never  ? Smokeless tobacco: Never  ?Vaping Use  ? Vaping Use: Never used  ?Substance and Sexual Activity  ? Alcohol use: No  ? Drug use: No  ? Sexual activity: Not on file  ?  Comment: lives with Daughter, grandson and his family. no dietary restricitons.  ?Other Topics Concern  ? Not on file  ?Social History Narrative  ? Not on file  ? ?Social Determinants of Health  ? ?Financial Resource Strain: Not on file  ?Food Insecurity: Not on file  ?Transportation Needs: Not on file  ?Physical Activity: Not on file  ?Stress: Not on file  ?Social Connections: Not on file  ?Intimate Partner Violence: Not on file  ? ? ?Outpatient Medications Prior to Visit  ?Medication Sig Dispense Refill  ? ALPRAZolam (XANAX) 0.25 MG tablet Take 0.5-1  tablets (0.125-0.25 mg total) by mouth 2 (two) times daily as needed for anxiety. 30 tablet 1  ? apixaban (ELIQUIS) 2.5 MG TABS tablet Take 1 tablet (2.5 mg total) by mouth 2 (two) times daily. 60 tablet 5  ? Cholecalciferol (VITAMIN D) 2000 UNITS CAPS Take 2,000 Units by mouth every other day.    ? famotidine (PEPCID) 20 MG tablet Take 40 mg by mouth at bedtime.     ? Ferrous Fumarate-Folic Acid (HEMOCYTE-F) 324-1 MG TABS Take 1 tablet by mouth daily. 30 tablet 5  ? hydrALAZINE (APRESOLINE) 10 MG tablet TAKE 1 TABLET BY MOUTH THREE TIMES A DAY 270 tablet 1  ? isosorbide mononitrate (IMDUR) 30 MG 24 hr tablet Take 1 tablet (30 mg total) by mouth daily. Please keep upcoming appt in April 2023 with Dr. Johney Frame before anymore refills. Thank you Final Attempt 90 tablet 0  ? levocetirizine (XYZAL) 5 MG tablet TAKE 1/2 TABLET (2.5 MG TOTAL) BY MOUTH EVERY EVENING. 45 tablet 1  ? metoprolol succinate (TOPROL XL) 25 MG 24 hr tablet Take 1 tablet (25 mg total) by mouth daily. 90 tablet 0  ? pantoprazole (PROTONIX) 40 MG tablet TAKE 1 TABLET BY MOUTH EVERY DAY 90 tablet 1  ? ?No facility-administered medications prior to visit.  ? ? ?Allergies  ?Allergen Reactions  ? Amlodipine Shortness Of Breath  ? Darifenacin Hydrobromide Other (See Comments)  ?  dizziness  ? Other Other (See Comments)  ?  dizziness  ? Sulfonamide Derivatives Other (See Comments)  ?  dizziness  ? Tramadol Other (See Comments)  ?  Insomnia, anorexia  ? ? ?ROS ? ?   ?Objective:  ?  ?Physical Exam ? ?There were no vitals taken for this visit. ?Wt Readings from Last 3 Encounters:  ?03/22/21 91 lb 3.2 oz (41.4 kg)  ?01/16/21 83 lb 12.8 oz (38 kg)  ?10/24/20 87 lb (39.5 kg)  ? ? ?Diabetic Foot Exam - Simple   ?No data filed ?  ? ?Lab Results  ?Component Value Date  ? WBC 7.2 03/22/2021  ? HGB 10.3 (L) 03/22/2021  ? HCT 32.4 (L) 03/22/2021  ? PLT 244 03/22/2021  ? GLUCOSE 101 (H) 03/22/2021  ? CHOL 117 10/24/2020  ? TRIG 128.0 10/24/2020  ? HDL 34.40 (L)  10/24/2020  ? LDLDIRECT 72.1 01/19/2009  ? [place] 57 10/24/2020  ? ALT 9 11/28/2020  ? AST 15 11/28/2020  ? NA 138 03/22/2021  ? K 4.0 03/22/2021  ? CL 104 03/22/2021  ?  CREATININE 1.27 (H) 03/22/2021  ? BUN 20 03/22/2021  ? CO2 26 03/22/2021  ? TSH 2.72 10/24/2020  ? INR 2.1 04/25/2018  ? HGBA1C 5.5 10/24/2020  ? ? ?Lab Results  ?Component Value Date  ? TSH 2.72 10/24/2020  ? ?Lab Results  ?Component Value Date  ? WBC 7.2 03/22/2021  ? HGB 10.3 (L) 03/22/2021  ? HCT 32.4 (L) 03/22/2021  ? MCV 91.8 03/22/2021  ? PLT 244 03/22/2021  ? ?Lab Results  ?Component Value Date  ? NA 138 03/22/2021  ? K 4.0 03/22/2021  ? CO2 26 03/22/2021  ? GLUCOSE 101 (H) 03/22/2021  ? BUN 20 03/22/2021  ? CREATININE 1.27 (H) 03/22/2021  ? BILITOT 0.4 11/28/2020  ? ALKPHOS 15 (L) 11/28/2020  ? AST 15 11/28/2020  ? ALT 9 11/28/2020  ? PROT 6.7 11/28/2020  ? ALBUMIN 3.2 (L) 11/28/2020  ? CALCIUM 8.6 (L) 03/22/2021  ? ANIONGAP 8 03/22/2021  ? GFR 48.10 (L) 11/28/2020  ? ?Lab Results  ?Component Value Date  ? CHOL 117 10/24/2020  ? ?Lab Results  ?Component Value Date  ? HDL 34.40 (L) 10/24/2020  ? ?Lab Results  ?Component Value Date  ? [employer] 57 10/24/2020  ? ?Lab Results  ?Component Value Date  ? TRIG 128.0 10/24/2020  ? ?Lab Results  ?Component Value Date  ? CHOLHDL 3 10/24/2020  ? ?Lab Results  ?Component Value Date  ? HGBA1C 5.5 10/24/2020  ? ? ?   ?Assessment & Plan:  ? ?Problem List Items Addressed This Visit   ?None ?Visit Diagnoses   ? ? Hospital discharge follow-up    -  Primary  ? ?  ? ? ?I am having Gladys Damme maintain her Vitamin D, famotidine, apixaban, hydrALAZINE, ALPRAZolam, Hemocyte-F, metoprolol succinate, pantoprazole, levocetirizine, and isosorbide mononitrate. ? ?No orders of the defined types were placed in this encounter. ? ? ?

## 2021-03-24 ENCOUNTER — Encounter: Payer: Self-pay | Admitting: Family

## 2021-03-24 ENCOUNTER — Ambulatory Visit (INDEPENDENT_AMBULATORY_CARE_PROVIDER_SITE_OTHER): Payer: Medicare Other | Admitting: Family

## 2021-03-24 VITALS — BP 160/80 | HR 84 | Temp 97.4°F | Ht 59.0 in | Wt 86.2 lb

## 2021-03-24 DIAGNOSIS — R350 Frequency of micturition: Secondary | ICD-10-CM | POA: Diagnosis not present

## 2021-03-24 DIAGNOSIS — R600 Localized edema: Secondary | ICD-10-CM

## 2021-03-24 DIAGNOSIS — I10 Essential (primary) hypertension: Secondary | ICD-10-CM | POA: Diagnosis not present

## 2021-03-24 DIAGNOSIS — Z09 Encounter for follow-up examination after completed treatment for conditions other than malignant neoplasm: Secondary | ICD-10-CM

## 2021-03-24 LAB — BASIC METABOLIC PANEL
BUN: 20 mg/dL (ref 6–23)
CO2: 29 mEq/L (ref 19–32)
Calcium: 8.5 mg/dL (ref 8.4–10.5)
Chloride: 104 mEq/L (ref 96–112)
Creatinine, Ser: 1.01 mg/dL (ref 0.40–1.20)
GFR: 47.42 mL/min — ABNORMAL LOW (ref >60.00)
Glucose, Bld: 80 mg/dL (ref 70–99)
Potassium: 4.5 mEq/L (ref 3.5–5.1)
Sodium: 138 mEq/L (ref 135–145)

## 2021-03-24 MED ORDER — LOSARTAN POTASSIUM 25 MG PO TABS: 25.0000 mg | ORAL_TABLET | Freq: Every day | ORAL | 0 refills | Status: DC

## 2021-03-24 NOTE — Progress Notes (Signed)
?[name] [name] is a 88 y.o. female with the following history as recorded in EpicCare:  ?Patient Active Problem List  ? Diagnosis Date Noted  ? Diarrhea 08/17/2020  ? Muscle cramp 06/22/2020  ? Chest pain of uncertain etiology 18/84/1660  ? Malnutrition ([place]) 11/09/2019  ? PAF (paroxysmal atrial fibrillation) ([place])   ? Esophageal dysphagia 10/29/2018  ? Cough 09/02/2017  ? Hyponatremia 06/29/2017  ? Bradycardia 06/29/2017  ? Chest pain 12/10/2016  ? Pedal edema 10/01/2016  ? Preventative health care 03/18/2016  ? Vitamin B12 deficiency 03/16/2016  ? Renal lithiasis 06/26/2015  ? Loss of weight 06/05/2015  ? Diverticulitis of colon 02/06/2015  ? Right-sided thoracic back pain 11/15/2014  ? Rib pain on right side 04/29/2014  ? Medicare annual wellness visit, subsequent 02/05/2014  ? Abdominal pain, chronic, right lower quadrant 02/03/2014  ? Rectal bleeding 12/03/2013  ? Prolapse of female pelvic organs 12/03/2013  ? Leukocytes in urine 11/25/2013  ? Abnormal chest x-ray 10/23/2013  ? Pulmonary hypertension ([place]) 10/09/2013  ? Tricuspid regurgitation 10/09/2013  ? Chronic anticoagulation 10/09/2013  ? Hyperglycemia 08/24/2013  ? Encounter for therapeutic drug monitoring 03/13/2013  ? Renal insufficiency 06/28/2012  ? Hyperkalemia 06/28/2012  ? Headache 06/22/2012  ? Sinus bradycardia 09/20/2011  ? Urinary frequency 02/13/2011  ? Arthritis 11/13/2010  ? Long term (current) use of anticoagulants 03/31/2010  ? DIZZINESS 08/03/2009  ? Achalasia 05/18/2009  ? DYSPHAGIA UNSPECIFIED 04/11/2009  ? Anemia 01/19/2009  ? ESOPHAGEAL STRICTURE 01/19/2009  ? Anxiety state 09/15/2008  ? MITRAL REGURGITATION, MILD 08/20/2008  ? Persistent atrial fibrillation ([place]) 08/20/2008  ? MITRAL VALVE PROLAPSE, HX OF 08/20/2008  ? Vitamin D deficiency 01/15/2008  ? IBS 02/03/2007  ? Hypothyroidism 01/29/2007  ? Hyperlipidemia, mixed 01/29/2007  ? Essential hypertension 01/29/2007  ? GERD 01/29/2007  ? Disorder of bone and cartilage 01/29/2007   ? COLONIC POLYPS, HX OF 01/29/2007  ?  ?Current Outpatient Medications  ?Medication Sig Dispense Refill  ? ALPRAZolam (XANAX) 0.25 MG tablet Take 0.5-1 tablets (0.125-0.25 mg total) by mouth 2 (two) times daily as needed for anxiety. 30 tablet 1  ? apixaban (ELIQUIS) 2.5 MG TABS tablet Take 1 tablet (2.5 mg total) by mouth 2 (two) times daily. 60 tablet 5  ? Cholecalciferol (VITAMIN D) 2000 UNITS CAPS Take 2,000 Units by mouth every other day.    ? famotidine (PEPCID) 20 MG tablet Take 40 mg by mouth at bedtime.     ? Ferrous Fumarate-Folic Acid (HEMOCYTE-F) 324-1 MG TABS Take 1 tablet by mouth daily. 30 tablet 5  ? hydrALAZINE (APRESOLINE) 10 MG tablet TAKE 1 TABLET BY MOUTH THREE TIMES A DAY 270 tablet 1  ? isosorbide mononitrate (IMDUR) 30 MG 24 hr tablet Take 1 tablet (30 mg total) by mouth daily. Please keep upcoming appt in April 2023 with Dr. Johney Frame before anymore refills. Thank you Final Attempt 90 tablet 0  ? levocetirizine (XYZAL) 5 MG tablet TAKE 1/2 TABLET (2.5 MG TOTAL) BY MOUTH EVERY EVENING. 45 tablet 1  ? losartan (COZAAR) 25 MG tablet Take 1 tablet (25 mg total) by mouth daily. 90 tablet 0  ? metoprolol succinate (TOPROL XL) 25 MG 24 hr tablet Take 1 tablet (25 mg total) by mouth daily. 90 tablet 0  ? pantoprazole (PROTONIX) 40 MG tablet TAKE 1 TABLET BY MOUTH EVERY DAY 90 tablet 1  ? ?No current facility-administered medications for this visit.  ?  ?Allergies: Amlodipine, Darifenacin hydrobromide, Other, Sulfonamide derivatives, and Tramadol  ?Past Medical History:  ?  Diagnosis Date  ? Anemia   ? Anxiety   ? Arthritis   ? hips, knees  ? Current use of long term anticoagulation   ? Diverticulitis of colon 02/06/2015  ? Gait difficulty   ? GERD (gastroesophageal reflux disease)   ? Headache(784.0) 06/22/2012  ? Hyperlipidemia   ? Hypertension   ? IBS (irritable bowel syndrome)   ? Incontinence   ? Loss of weight 06/05/2015  ? Medicare annual wellness visit, subsequent 02/05/2014  ? Melena   ? Mild mitral  regurgitation   ? Mild tricuspid regurgitation   ? Mitral and aortic regurgitation   ? Mitral valve prolapse   ? hx of - not seen on recent echoes  ? Osteopenia   ? Paroxysmal atrial flutter ([place])   ? Pedal edema 10/01/2016  ? Persistent atrial fibrillation ([place])   ? Personal history of colonic polyps   ? Renal lithiasis 06/26/2015  ? Sinus bradycardia   ? Thyroid disease   ? hypo  ? Vitamin B12 deficiency 03/16/2016  ? Vitamin D deficiency   ?  ?Past Surgical History:  ?Procedure Laterality Date  ? BOTOX INJECTION N/A 09/03/2012  ? Procedure: BOTOX INJECTION;  Surgeon: Inda Castle, MD;  Location: [facility];  Service: Endoscopy;  Laterality: N/A;  ? BREAST BIOPSY Left   ? BREAST LUMPECTOMY Right 08/18/2014  ? Procedure: RIGHT BREAST LUMPECTOMY;  Surgeon: Coralie Keens, MD;  Location: [place];  Service: General;  Laterality: Right;  ? CARDIAC ELECTROPHYSIOLOGY MAPPING AND ABLATION    ? CATARACT EXTRACTION, BILATERAL    ? ESOPHAGOGASTRODUODENOSCOPY N/A 09/03/2012  ? Procedure: ESOPHAGOGASTRODUODENOSCOPY (EGD);  Surgeon: Inda Castle, MD;  Location: [facility];  Service: Endoscopy;  Laterality: N/A;  ? ESOPHAGOGASTRODUODENOSCOPY (EGD) WITH PROPOFOL N/A 12/02/2018  ? Procedure: ESOPHAGOGASTRODUODENOSCOPY (EGD) WITH PROPOFOL;  Surgeon: [name], MD;  [facility];  Service: Gastroenterology;  Laterality: N/A;  ? ESOPHAGOSCOPY W/ BOTOX INJECTION    ? I & D EXTREMITY Right 11/06/2012  ? Procedure: IRRIGATION AND DEBRIDEMENT EXTREMITY Right Ring Finger;  Surgeon: Tennis Must, MD;  Location: [facility];  Service: Orthopedics;  Laterality: Right;  ? PARTIAL HYSTERECTOMY    ? ovaries left in place  ? skin cancer removal    ?  ?Family History  ?Problem Relation Age of Onset  ? Diabetes Mother   ? CVA Mother   ? Stroke Mother   ? COPD Father   ? Rheum arthritis Father   ? Allergies Daughter   ? COPD Daughter   ? Stroke Daughter   ? COPD Daughter   ?     previous smoker  ? Stroke Maternal  Grandfather   ? Heart disease Maternal Aunt   ? Heart disease Maternal Uncle   ? Colon cancer Neg Hx   ? Colon polyps Neg Hx   ? Esophageal cancer Neg Hx   ? Gallbladder disease Neg Hx   ? Kidney disease Neg Hx   ? Heart attack Neg Hx   ? Hypertension Neg Hx   ?  ?Social History  ? ?Tobacco Use  ? Smoking status: Never  ? Smokeless tobacco: Never  ?Substance Use Topics  ? Alcohol use: No  ?  ?Subjective:  ?Accompanied by her daughter today; was seen at ER 2 days ago with concerns for dizziness/ possible UTI; was also concerned that blood pressure was elevated at ER; ?Patient does not check her blood pressure at home; does note that she has been  having problems with swelling in her ankles for the past month; has not reached out to her cardiologist- planning to discuss at upcoming visit next month; is currently taking Isosorbide, Toprol XL and Hydralazine 10 mg tid;  ? ?Per patient, she is feeling very good today; has not had any further dizziness since the 2 episodes that prompted ER visit; she notes the dizziness occurred both time when she moved her head in the bed and resolved almost immediately.  ? ? ? ? ?Objective:  ?Vitals:  ? 03/24/21 0941  ?BP: (!) 160/80  ?Pulse: 84  ?Temp: (!) 97.4 ?F (36.3 ?C)  ?TempSrc: Oral  ?SpO2: 97%  ?Weight: 86 lb 3.2 oz (39.1 kg)  ?Height: '4\' 11"'$  (1.499 m)  ?  ?General: Well developed, well nourished, in no acute distress  ?Skin : Warm and dry.  ?Head: Normocephalic and atraumatic  ?Eyes: Sclera and conjunctiva clear; pupils round and reactive to light; extraocular movements intact  ?Ears: External normal; canals clear; tympanic membranes normal  ?Oropharynx: Pink, supple. No suspicious lesions  ?Neck: Supple without thyromegaly, adenopathy  ?Lungs: Respirations unlabored; clear to auscultation bilaterally without wheeze, rales, rhonchi  ?CVS exam: normal rate and regular rhythm.  ?Abdomen: Soft; nontender; nondistended; normoactive bowel sounds; no masses or hepatosplenomegaly   ?Musculoskeletal: No deformities; no active joint inflammation  ?Extremities: pedal edema- not pitting, cyanosis, clubbing  ?Vessels: Symmetric bilaterally  ?Neurologic: Alert and oriented; speech intac

## 2021-03-25 LAB — URINE CULTURE
MICRO NUMBER:: 13176543
SPECIMEN QUALITY:: ADEQUATE

## 2021-03-28 LAB — BRAIN NATRIURETIC PEPTIDE: Pro B Natriuretic peptide (BNP): 178 pg/mL — ABNORMAL HIGH (ref 0.0–100.0)

## 2021-04-03 NOTE — Progress Notes (Signed)
? ? ?Office Visit  ?  ?Patient Name: [name] [name] ?Date of Encounter: 04/04/2021 ? ?Primary Care Provider:  Mosie Lukes, MD ?Primary Cardiologist:  [name], MD ?Primary Electrophysiologist: None ?Chief Complaint  ?  ?Follow-up for dizziness and swelling ? ? Patient Profile: ?Persistent atrial fibrillation ?Hypertension ?Anemia  ?GERD ?HLD ?Sinus bradycardia ? ? Recent Studies: ?TTE 2018 showed EF 55-60%, mild MR, moderate LAE, mild TR, normal PASP ?CT angio chest/abd/pelvis showed no acute dissection/ aneurysm, + atherosclerotic vascular disease with suspected renal artery stenosis ?Lower Ext U/S: No evidence of thrombus in left or right lower extremity ? ?History of Present Illness  ?  ?[name] [name] is a 88 y.o. female who presents today for follow-up. She was seen in ED on 03/22/2021 with complaint of bilateral lower extremity swelling and increased urination.  She had no complaints of chest pain or shortness of breath, but did have dizziness. Lower extremity ultrasounds were negative for DVT.  Her urinalysis was negative for UTI and BMET was significant for increased creatinine to 1.27.  Her last echo was completed on 12/2016 with EF 55-60% and mild MR. ?Since being seen in the ER she reports doing well.  She stated her only concern since being discharge is the increased swelling in her feet bilaterally.  Patient is euvolemic on examination today and blood pressure is 118/74.  She was started on losartan 25 mg by her PCP after discharge from hospital.  She has tolerated medication well so far and denies any complaints of dizziness.  She denies chest pain, palpitations, dyspnea, PND, orthopnea, nausea, vomiting, dizziness, syncope, edema, weight gain, or early satiety. ? ?Past Medical History  ?  ?Past Medical History:  ?Diagnosis Date  ? Anemia   ? Anxiety   ? Arthritis   ? hips, knees  ? Current use of long term anticoagulation   ? Diverticulitis of colon 02/06/2015  ? Gait difficulty   ? GERD  (gastroesophageal reflux disease)   ? Headache(784.0) 06/22/2012  ? Hyperlipidemia   ? Hypertension   ? IBS (irritable bowel syndrome)   ? Incontinence   ? Loss of weight 06/05/2015  ? Medicare annual wellness visit, subsequent 02/05/2014  ? Melena   ? Mild mitral regurgitation   ? Mild tricuspid regurgitation   ? Mitral and aortic regurgitation   ? Mitral valve prolapse   ? hx of - not seen on recent echoes  ? Osteopenia   ? Paroxysmal atrial flutter ([place])   ? Pedal edema 10/01/2016  ? Persistent atrial fibrillation ([place])   ? Personal history of colonic polyps   ? Renal lithiasis 06/26/2015  ? Sinus bradycardia   ? Thyroid disease   ? hypo  ? Vitamin B12 deficiency 03/16/2016  ? Vitamin D deficiency   ? ?Past Surgical History:  ?Procedure Laterality Date  ? BOTOX INJECTION N/A 09/03/2012  ? Procedure: BOTOX INJECTION;  Surgeon: Inda Castle, MD;  Location: [facility];  Service: Endoscopy;  Laterality: N/A;  ? BREAST BIOPSY Left   ? BREAST LUMPECTOMY Right 08/18/2014  ? Procedure: RIGHT BREAST LUMPECTOMY;  Surgeon: Coralie Keens, MD;  Location: [place];  Service: General;  Laterality: Right;  ? CARDIAC ELECTROPHYSIOLOGY MAPPING AND ABLATION    ? CATARACT EXTRACTION, BILATERAL    ? ESOPHAGOGASTRODUODENOSCOPY N/A 09/03/2012  ? Procedure: ESOPHAGOGASTRODUODENOSCOPY (EGD);  Surgeon: Inda Castle, MD;  Location: [facility];  Service: Endoscopy;  Laterality: N/A;  ? ESOPHAGOGASTRODUODENOSCOPY (EGD) WITH PROPOFOL N/A 12/02/2018  ?  Procedure: ESOPHAGOGASTRODUODENOSCOPY (EGD) WITH PROPOFOL;  Surgeon: [name], MD;  [facility];  Service: Gastroenterology;  Laterality: N/A;  ? ESOPHAGOSCOPY W/ BOTOX INJECTION    ? I & D EXTREMITY Right 11/06/2012  ? Procedure: IRRIGATION AND DEBRIDEMENT EXTREMITY Right Ring Finger;  Surgeon: Tennis Must, MD;  Location: [place];  Service: Orthopedics;  Laterality: Right;  ? PARTIAL HYSTERECTOMY    ? ovaries left in place  ? skin cancer removal     ? ? ?Allergies ? ?Allergies  ?Allergen Reactions  ? Amlodipine Shortness Of Breath  ? Darifenacin Hydrobromide Other (See Comments)  ?  dizziness  ? Other Other (See Comments)  ?  dizziness  ? Sulfonamide Derivatives Other (See Comments)  ?  dizziness  ? Tramadol Other (See Comments)  ?  Insomnia, anorexia  ? ? ?Home Medications  ?  ?Current Outpatient Medications  ?Medication Sig Dispense Refill  ? ALPRAZolam (XANAX) 0.25 MG tablet Take 0.5-1 tablets (0.125-0.25 mg total) by mouth 2 (two) times daily as needed for anxiety. 30 tablet 1  ? apixaban (ELIQUIS) 2.5 MG TABS tablet Take 1 tablet (2.5 mg total) by mouth 2 (two) times daily. 60 tablet 5  ? Cholecalciferol (VITAMIN D) 2000 UNITS CAPS Take 2,000 Units by mouth every other day.    ? famotidine (PEPCID) 20 MG tablet Take 40 mg by mouth at bedtime.     ? Ferrous Fumarate-Folic Acid (HEMOCYTE-F) 324-1 MG TABS Take 1 tablet by mouth daily. 30 tablet 5  ? furosemide (LASIX) 20 MG tablet Take 1 tablet (20 mg total) by mouth as needed for fluid or edema (swelling or weight gain of 2lbs overnight or 5lbs in a week). 90 tablet 3  ? hydrALAZINE (APRESOLINE) 10 MG tablet TAKE 1 TABLET BY MOUTH THREE TIMES A DAY 270 tablet 1  ? isosorbide mononitrate (IMDUR) 30 MG 24 hr tablet Take 1 tablet (30 mg total) by mouth daily. Please keep upcoming appt in April 2023 with Dr. Johney Frame before anymore refills. Thank you Final Attempt 90 tablet 0  ? levocetirizine (XYZAL) 5 MG tablet TAKE 1/2 TABLET (2.5 MG TOTAL) BY MOUTH EVERY EVENING. 45 tablet 1  ? losartan (COZAAR) 25 MG tablet Take 1 tablet (25 mg total) by mouth daily. 90 tablet 0  ? metoprolol succinate (TOPROL XL) 25 MG 24 hr tablet Take 1 tablet (25 mg total) by mouth daily. 90 tablet 0  ? pantoprazole (PROTONIX) 40 MG tablet TAKE 1 TABLET BY MOUTH EVERY DAY 90 tablet 1  ? ?No current facility-administered medications for this visit.  ?  ? ?Review of Systems  ?Please see the history of present illness.    ?(+) Lower  extremity swelling ?All other systems reviewed and are otherwise negative except as noted above. ? ?Physical Exam  ?  ?Wt Readings from Last 3 Encounters:  ?04/04/21 85 lb 8 oz (38.8 kg)  ?03/24/21 86 lb 3.2 oz (39.1 kg)  ?03/22/21 91 lb 3.2 oz (41.4 kg)  ? ?VS: ?Vitals:  ? 04/04/21 1414  ?BP: 118/74  ?Pulse: 74  ?SpO2: 96%  ?,Body mass index is 17.27 kg/m?. ? ?Constitutional:   ?   Appearance: Healthy appearance. Not in distress.  ?Neck:  ?   Vascular: JVD normal.  ?Pulmonary:  ?   Effort: Pulmonary effort is normal.  ?   Breath sounds: No wheezing. No rales.  ?Cardiovascular:  ?   Normal rate. Regular rhythm. Normal S1. Normal S2.   ?   Murmurs:  There is no murmur.  ?Edema: ?   Bilateral edema +2 without pitting  ?Abdominal:  ?   Palpations: Abdomen is soft. There is no hepatomegaly.  ?Skin: ?   General: Skin is warm and dry.  ?Neurological:  ?   General: No focal deficit present.  ?   Mental Status: Alert and oriented to person, place and time.  ?   Cranial Nerves: Cranial nerves are intact.  ?EKG/LABS/Other Studies Reviewed  ?  ?ECG personally reviewed by me today -none completed today  ? ?Risk Assessment/Calculations:   ? ?CHA2DS2-VASc Score = 4  ? This indicates a 4.8% annual risk of stroke. ?The patient's score is based upon: ?CHF History: 0 ?HTN History: 1 ?Diabetes History: 0 ?Stroke History: 0 ?Vascular Disease History: 0 ?Age Score: 2 ?Gender Score: 1 ?  ?Lab Results  ?Component Value Date  ? WBC 7.2 03/22/2021  ? HGB 10.3 (L) 03/22/2021  ? HCT 32.4 (L) 03/22/2021  ? MCV 91.8 03/22/2021  ? PLT 244 03/22/2021  ? ?Lab Results  ?Component Value Date  ? CREATININE 1.01 03/24/2021  ? BUN 20 03/24/2021  ? NA 138 03/24/2021  ? K 4.5 03/24/2021  ? CL 104 03/24/2021  ? CO2 29 03/24/2021  ? ?Lab Results  ?Component Value Date  ? ALT 9 11/28/2020  ? AST 15 11/28/2020  ? ALKPHOS 15 (L) 11/28/2020  ? BILITOT 0.4 11/28/2020  ? ?Lab Results  ?Component Value Date  ? CHOL 117 10/24/2020  ? HDL 34.40 (L) 10/24/2020  ?  West 57 10/24/2020  ? LDLDIRECT 72.1 01/19/2009  ? TRIG 128.0 10/24/2020  ? CHOLHDL 3 10/24/2020  ?  ?Lab Results  ?Component Value Date  ? HGBA1C 5.5 10/24/2020  ? ? ?Assessment & Plan  ?  ?1.  Mild mitral regurgitat

## 2021-04-04 ENCOUNTER — Encounter (HOSPITAL_BASED_OUTPATIENT_CLINIC_OR_DEPARTMENT_OTHER): Payer: Self-pay | Admitting: Nurse Practitioner

## 2021-04-04 ENCOUNTER — Ambulatory Visit (HOSPITAL_BASED_OUTPATIENT_CLINIC_OR_DEPARTMENT_OTHER): Payer: Medicare Other | Admitting: Nurse Practitioner

## 2021-04-04 VITALS — BP 118/74 | HR 74 | Ht 59.0 in | Wt 85.5 lb

## 2021-04-04 DIAGNOSIS — R600 Localized edema: Secondary | ICD-10-CM | POA: Diagnosis not present

## 2021-04-04 DIAGNOSIS — I10 Essential (primary) hypertension: Secondary | ICD-10-CM

## 2021-04-04 DIAGNOSIS — I4819 Other persistent atrial fibrillation: Secondary | ICD-10-CM

## 2021-04-04 DIAGNOSIS — E782 Mixed hyperlipidemia: Secondary | ICD-10-CM | POA: Diagnosis not present

## 2021-04-04 DIAGNOSIS — I080 Rheumatic disorders of both mitral and aortic valves: Secondary | ICD-10-CM | POA: Diagnosis not present

## 2021-04-04 MED ORDER — FUROSEMIDE 20 MG PO TABS: 20.0000 mg | ORAL_TABLET | ORAL | 3 refills | Status: DC | PRN

## 2021-04-04 NOTE — Patient Instructions (Addendum)
Medication Instructions:  ?Your physician has recommended you make the following change in your medication:  ? ?Start: Lasix '20mg'$  x3 days then use as needed for swelling or weight gain of 2 pounds overnight or 5 pounds in a week.  ? ?*If you need a refill on your cardiac medications before your next appointment, please call your pharmacy* ? ? ?Lab Work: ?Your physician recommends that you return for lab work on Tuesday- BMET  ? ?You may come to the...  ? ?[facility] (3rd floor) ?[address], [place], [place]  ?Open: 8am-Noon and 1pm-4:30pm  ? ?[facility] ?[place]  ? ?[employer]- Any location ? ?**no appointments needed** ? ?If you have labs (blood work) drawn today and your tests are completely normal, you will receive your results only by: ?MyChart Message (if you have MyChart) OR ?A paper copy in the mail ?If you have any lab test that is abnormal or we need to change your treatment, we will call you to review the results. ? ?Follow-Up: ?At [facility], you and your health needs are our priority.  As part of our continuing mission to provide you with exceptional heart care, we have created designated Provider Care Teams.  These Care Teams include your primary Cardiologist (physician) and Advanced Practice Providers (APPs -  Physician Assistants and Nurse Practitioners) who all work together to provide you with the care you need, when you need it. ? ?We recommend signing up for the patient portal called "MyChart".  Sign up information is provided on this After Visit Summary.  MyChart is used to connect with patients for Virtual Visits (Telemedicine).  Patients are able to view lab/test results, encounter notes, upcoming appointments, etc.  Non-urgent messages can be sent to your provider as well.   ?To learn more about what you can do with MyChart, go to [email or web address].   ? ?Your next appointment:   ?Follow up as scheduled with  Dr. Johney Frame in May  ? ?Other Instructions ?Recommend weighing daily and keeping a log. Please call our office if you have weight gain of 2 pounds overnight or 5 pounds in 1 week.  ? ?Date ? Time Weight  ? ?    ? ?    ? ?    ? ?    ? ?    ? ?    ? ?    ? ?    ? ?We would recommend buying an arm blood pressure cuff! [employer] is a good brand and can be found at most drug stores and [employer].  ? ? ?Tips to Measure your Blood Pressure Correctly ? ?To determine whether you have hypertension, a medical professional will take a blood pressure reading. How you prepare for the test, the position of your arm, and other factors can change a blood pressure reading by 10% or more. That could be enough to hide high blood pressure, start you on a drug you don't really need, or lead your doctor to incorrectly adjust your medications. ? ?National and international guidelines offer specific instructions for measuring blood pressure. If a doctor, nurse, or medical assistant isn't doing it right, don't hesitate to ask him or her to get with the guidelines. ? ?Here's what you can do to ensure a correct reading: ? Don't drink a caffeinated beverage or smoke during the 30 minutes before the test. ? Sit quietly for five minutes before the test begins. ? During the measurement, sit in  a chair with your feet on the floor and your arm supported so your elbow is at about heart level. ? The inflatable part of the cuff should completely cover at least 80% of your upper arm, and the cuff should be placed on bare skin, not over a shirt. ? Don't talk during the measurement. ? Have your blood pressure measured twice, with a brief break in between. If the readings are different by 5 points or more, have it done a third time. ? ?In 2017, new guidelines from the [place], the [employer] of Cardiology, and nine other health organizations lowered the diagnosis of high blood pressure to 130/80 mm Hg or higher for all adults. The  guidelines also redefined the various blood pressure categories to now include normal, elevated, Stage 1 hypertension, Stage 2 hypertension, and hypertensive crisis (see "Blood pressure categories"). ? ?Blood pressure categories  ?Blood pressure category SYSTOLIC ?(upper number)  DIASTOLIC ?(lower number)  ?Normal Less than 120 mm Hg and Less than 80 mm Hg  ?Elevated 120-129 mm Hg and Less than 80 mm Hg  ?High blood pressure: Stage 1 hypertension 130-139 mm Hg or 80-89 mm Hg  ?High blood pressure: Stage 2 hypertension 140 mm Hg or higher or 90 mm Hg or higher  ?Hypertensive crisis (consult your doctor immediately) Higher than 180 mm Hg and/or Higher than 120 mm Hg  ?Source: American Heart Association and American Stroke Association. ?For more on getting your blood pressure under control, buy Controlling Your Blood Pressure, a Special Health Report from [facility]. ? ? ?Blood Pressure Log ? ? ?Date ?  ?Time  ?Blood Pressure  ?Position  ?Example: Nov 1 9 AM 124/78 sitting  ? ?     ? ?     ? ?     ? ?     ? ?     ? ?     ? ?     ? ?     ? ? ? ? ?

## 2021-04-11 DIAGNOSIS — R600 Localized edema: Secondary | ICD-10-CM | POA: Diagnosis not present

## 2021-04-11 LAB — BASIC METABOLIC PANEL
BUN/Creatinine Ratio: 25 (ref 12–28)
BUN: 30 mg/dL (ref 10–36)
CO2: 25 mmol/L (ref 20–29)
Calcium: 8.4 mg/dL — ABNORMAL LOW (ref 8.7–10.3)
Chloride: 102 mmol/L (ref 96–106)
Creatinine, Ser: 1.22 mg/dL — ABNORMAL HIGH (ref 0.57–1.00)
Glucose: 136 mg/dL — ABNORMAL HIGH (ref 70–99)
Potassium: 4.8 mmol/L (ref 3.5–5.2)
Sodium: 139 mmol/L (ref 134–144)
eGFR: 41 mL/min/{1.73_m2} — ABNORMAL LOW (ref >59)

## 2021-04-12 ENCOUNTER — Telehealth: Payer: Self-pay

## 2021-04-12 NOTE — Telephone Encounter (Signed)
Appointment with Dr. Larose Kells on 04/14/21. ?

## 2021-04-12 NOTE — Telephone Encounter (Signed)
Patient Name: [name] [name] ?Gender: Female ?DOB: 1924/09/13 ?Age: 88 Y 3 M 2 D ?Return Phone Number: [phone] ?Client [facility] - Client ?Client Site [facility] Day ?Provider Penni Homans - MD ?Contact Type Call ?Who Is Calling Patient / Member / Family / Caregiver ?Call Type Triage / Clinical ?Caller Name Izora Gala Cruthers ?Relationship To Patient Daughter ?Return Phone Number [phone] (Primary) ?Chief Complaint Headache ?Reason for Call Symptomatic / Request for Health Information ?Initial Comment Caller states her mother is experiencing a ?head cold and it is in her chest. Denies pain or ?discomfort in chest area. Has had productive ?cough for over a week. Was wanting to see the ?doctor. ? ?

## 2021-04-14 ENCOUNTER — Encounter: Payer: Self-pay | Admitting: Internal Medicine

## 2021-04-14 ENCOUNTER — Ambulatory Visit (INDEPENDENT_AMBULATORY_CARE_PROVIDER_SITE_OTHER): Payer: Medicare Other | Admitting: Internal Medicine

## 2021-04-14 VITALS — BP 126/84 | HR 70 | Temp 97.6°F | Resp 18 | Ht 59.0 in | Wt 83.2 lb

## 2021-04-14 DIAGNOSIS — J40 Bronchitis, not specified as acute or chronic: Secondary | ICD-10-CM

## 2021-04-14 MED ORDER — AZITHROMYCIN 250 MG PO TABS: ORAL_TABLET | ORAL | 0 refills | Status: DC

## 2021-04-14 NOTE — Patient Instructions (Signed)
I think you have bronchitis ? ?Recommend to take lots of fluids ? ?Take the antibiotic we sent (zithromax) ? ?For cough and chest congestion: Try Robitussin-DM as needed. ? ?If you are not gradually better please let us know ?

## 2021-04-14 NOTE — Progress Notes (Signed)
? ?Subjective:  ? ? Patient ID: [name] [name], female    DOB: [birth year], 88 y.o.   MRN: [id number] ? ?DOS:  04/14/2021 ?Type of visit - description: Acute ? ?Here with her daughter. ?Symptoms a started about 3 weeks ago: ?Chest congestion, mild sinus congestion. ?She has cough which is productive of green sputum. ? ?She does not recall any fever or chills.  Has not take anything except NyQuil. ?Denies chest pain or difficulty breathing ?Lower extremity edema at baseline. ?Other than above she feels well ? ?Review of Systems ?See above  ? ?Past Medical History:  ?Diagnosis Date  ? Anemia   ? Anxiety   ? Arthritis   ? hips, knees  ? Current use of long term anticoagulation   ? Diverticulitis of colon 02/06/2015  ? Gait difficulty   ? GERD (gastroesophageal reflux disease)   ? Headache(784.0) 06/22/2012  ? Hyperlipidemia   ? Hypertension   ? IBS (irritable bowel syndrome)   ? Incontinence   ? Loss of weight 06/05/2015  ? Medicare annual wellness visit, subsequent 02/05/2014  ? Melena   ? Mild mitral regurgitation   ? Mild tricuspid regurgitation   ? Mitral and aortic regurgitation   ? Mitral valve prolapse   ? hx of - not seen on recent echoes  ? Osteopenia   ? Paroxysmal atrial flutter ([place])   ? Pedal edema 10/01/2016  ? Persistent atrial fibrillation ([place])   ? Personal history of colonic polyps   ? Renal lithiasis 06/26/2015  ? Sinus bradycardia   ? Thyroid disease   ? hypo  ? Vitamin B12 deficiency 03/16/2016  ? Vitamin D deficiency   ? ? ?Past Surgical History:  ?Procedure Laterality Date  ? BOTOX INJECTION N/A 09/03/2012  ? Procedure: BOTOX INJECTION;  Surgeon: Inda Castle, MD;  [facility];  Service: Endoscopy;  Laterality: N/A;  ? BREAST BIOPSY Left   ? BREAST LUMPECTOMY Right 08/18/2014  ? Procedure: RIGHT BREAST LUMPECTOMY;  Surgeon: Coralie Keens, MD;  Location: [facility];  Service: General;  Laterality: Right;  ? CARDIAC ELECTROPHYSIOLOGY MAPPING AND ABLATION    ? CATARACT EXTRACTION,  BILATERAL    ? ESOPHAGOGASTRODUODENOSCOPY N/A 09/03/2012  ? Procedure: ESOPHAGOGASTRODUODENOSCOPY (EGD);  Surgeon: Inda Castle, MD;  Location: [facility];  Service: Endoscopy;  Laterality: N/A;  ? ESOPHAGOGASTRODUODENOSCOPY (EGD) WITH PROPOFOL N/A 12/02/2018  ? Procedure: ESOPHAGOGASTRODUODENOSCOPY (EGD) WITH PROPOFOL;  Surgeon: [name], MD;  [facility];  Service: Gastroenterology;  Laterality: N/A;  ? ESOPHAGOSCOPY W/ BOTOX INJECTION    ? I & D EXTREMITY Right 11/06/2012  ? Procedure: IRRIGATION AND DEBRIDEMENT EXTREMITY Right Ring Finger;  Surgeon: Tennis Must, MD;  Location: [facility];  Service: Orthopedics;  Laterality: Right;  ? PARTIAL HYSTERECTOMY    ? ovaries left in place  ? skin cancer removal    ? ? ?Current Outpatient Medications  ?Medication Instructions  ? ALPRAZolam (XANAX) 0.125-0.25 mg, Oral, 2 times daily PRN  ? apixaban (ELIQUIS) 2.5 mg, Oral, 2 times daily  ? azithromycin (ZITHROMAX) 250 MG tablet Take 2 tablets by mouth first day, then 1 tablet for 4 additional days  ? famotidine (PEPCID) 40 mg, Oral, Daily at bedtime  ? Ferrous Fumarate-Folic Acid (HEMOCYTE-F) 324-1 MG TABS 1 tablet, Oral, Daily  ? furosemide (LASIX) 20 mg, Oral, As needed  ? hydrALAZINE (APRESOLINE) 10 MG tablet TAKE 1 TABLET BY MOUTH THREE TIMES A DAY  ? isosorbide mononitrate (IMDUR) 30 mg, Oral,  Daily, Please keep upcoming appt in April 2023 with Dr. Johney Frame before anymore refills. Thank you Final Attempt  ? levocetirizine (XYZAL) 5 MG tablet TAKE 1/2 TABLET (2.5 MG TOTAL) BY MOUTH EVERY EVENING.  ? losartan (COZAAR) 25 mg, Oral, Daily  ? metoprolol succinate (TOPROL XL) 25 mg, Oral, Daily  ? pantoprazole (PROTONIX) 40 MG tablet TAKE 1 TABLET BY MOUTH EVERY DAY  ? Vitamin D 2,000 Units, Oral, Every other day  ? ? ?   ?Objective:  ? Physical Exam ?BP 126/84 (BP Location: Left Arm, Patient Position: Sitting, Cuff Size: Small)   Pulse 70   Temp 97.6 ?F (36.4 ?C) (Oral)   Resp 18   Ht '4\' 11"'$  (1.499  m)   Wt 83 lb 4 oz (37.8 kg)   SpO2 96%   BMI 16.81 kg/m?  ?General:   ?Elderly lady in no distress ?HEENT:  ?Normocephalic . Face symmetric, atraumatic. ?Nose not congested.  Sinuses non-TTP.  Throat symmetric ?Lungs:  ?+ Rhonchi bilaterally without wheezing, no crackles. ?Normal respiratory effort, no intercostal retractions, no accessory muscle use. ?Heart: Irregular, + murmur.  ?Lower extremities: Trace pretibial edema ?Skin: Not pale. Not jaundice ?Neurologic:  ?alert & oriented X3.  ?Speech normal, gait appropriate for age and unassisted ?Psych--  ?Cognition and judgment appear intact.  ?Cooperative with normal attention span and concentration.  ?Behavior appropriate. ?No anxious or depressed appearing.  ? ?   ?Assessment   ? ?  ?88 year old female, A Fib,anticoagulated, HTN, MR, presents with: ?Bronchitis: ?On clinical grounds no evidence of pneumonia or CHF.  Suspect bronchitis. ?Plan: Fluids, Zithromax, Robitussin-DM.  Call if not gradually better. ? ? ?This visit occurred during the SARS-CoV-2 public health emergency.  Safety protocols were in place, including screening questions prior to the visit, additional usage of staff PPE, and extensive cleaning of exam room while observing appropriate contact time as indicated for disinfecting solutions.  ? ?

## 2021-04-18 NOTE — Progress Notes (Deleted)
?Cardiology Office Note:   ? ?Date:  04/18/2021  ? ?ID:  [name] [name], DOB 07/22/1924, MRN [id number] ? ?PCP:  Mosie Lukes, MD ?  ?[facility] Providers ?Cardiologist:  [name], MD { ?  ? ?Referring MD: Mosie Lukes, MD  ? ? ?History of Present Illness:   ? ?[name] [name] is a 88 y.o. female with a hx of persistent atrial fibrillation, HTN, sinus bradycardia, HLD, anemia, anxiety, arthritis, diverticulosis, GERD, and thyroid disease who was previously followed by Dr. [name] who now presents to clinic for follow-up. ? ?Per review of the record, she been felt to have had chronic atrial fib. TTE 2018 showed EF 55-60%, mild MR, moderate LAE, mild TR, normal PASP. She was seen in the ER 06/20/17 with discomfort in chest, upper abdomen and back. CT angio chest/abd/pelvis showed no acute dissection/ aneurysm, + atherosclerotic vascular disease with suspected renal artery stenosis, possible 16 mm hyperenhancing splenic mass, sigmoid diverticular disease . Out-patient w/u of splenic mass recommended. She was then re-hospitalized with weakness and decreased appetite found to be hyponatremic and slightly bradycardic.  She was treated for possible SIADH. Initially the patient was bradycardic and her beta-blocker was held but heart rate increased to greater than 100. She was seen in consultation by Dr. Marlou Porch who felt that her rhythm was actually atrial flutter appearing. He advised continued rate control on low-dose Toprol and watch for bradycardia. Her Coumadin was switched to Eliquis during begininng of Covid pandemic. Event monitoring in 09/2018 with findings of persistent atrial flutter with variable block with minimum HR 45, maximum HR 136 and average HR 65 BPM. 1 episode of ventricular tachycardia lasting 8 beats. ? ?Was last seen in clinic on 05/2020 where she was doing better. Palpitations were well controlled. Tolerated apixaban without issues. ? ?Today, *** ? ? ?Past Medical History:  ?Diagnosis  Date  ? Anemia   ? Anxiety   ? Arthritis   ? hips, knees  ? Current use of long term anticoagulation   ? Diverticulitis of colon 02/06/2015  ? Gait difficulty   ? GERD (gastroesophageal reflux disease)   ? Headache(784.0) 06/22/2012  ? Hyperlipidemia   ? Hypertension   ? IBS (irritable bowel syndrome)   ? Incontinence   ? Loss of weight 06/05/2015  ? Medicare annual wellness visit, subsequent 02/05/2014  ? Melena   ? Mild mitral regurgitation   ? Mild tricuspid regurgitation   ? Mitral and aortic regurgitation   ? Mitral valve prolapse   ? hx of - not seen on recent echoes  ? Osteopenia   ? Paroxysmal atrial flutter ([place])   ? Pedal edema 10/01/2016  ? Persistent atrial fibrillation ([place])   ? Personal history of colonic polyps   ? Renal lithiasis 06/26/2015  ? Sinus bradycardia   ? Thyroid disease   ? hypo  ? Vitamin B12 deficiency 03/16/2016  ? Vitamin D deficiency   ? ? ?Past Surgical History:  ?Procedure Laterality Date  ? BOTOX INJECTION N/A 09/03/2012  ? Procedure: BOTOX INJECTION;  Surgeon: Inda Castle, MD;  Location: [facility];  Service: Endoscopy;  Laterality: N/A;  ? BREAST BIOPSY Left   ? BREAST LUMPECTOMY Right 08/18/2014  ? Procedure: RIGHT BREAST LUMPECTOMY;  Surgeon: Coralie Keens, MD;  Location: [place];  Service: General;  Laterality: Right;  ? CARDIAC ELECTROPHYSIOLOGY MAPPING AND ABLATION    ? CATARACT EXTRACTION, BILATERAL    ? ESOPHAGOGASTRODUODENOSCOPY N/A 09/03/2012  ? Procedure: ESOPHAGOGASTRODUODENOSCOPY (  EGD);  Surgeon: Inda Castle, MD;  Location: [facility];  Service: Endoscopy;  Laterality: N/A;  ? ESOPHAGOGASTRODUODENOSCOPY (EGD) WITH PROPOFOL N/A 12/02/2018  ? Procedure: ESOPHAGOGASTRODUODENOSCOPY (EGD) WITH PROPOFOL;  Surgeon: [name], MD;  Location: [facility];  Service: Gastroenterology;  Laterality: N/A;  ? ESOPHAGOSCOPY W/ BOTOX INJECTION    ? I & D EXTREMITY Right 11/06/2012  ? Procedure: IRRIGATION AND DEBRIDEMENT EXTREMITY Right Ring Finger;  Surgeon:  Tennis Must, MD;  Location: [place];  Service: Orthopedics;  Laterality: Right;  ? PARTIAL HYSTERECTOMY    ? ovaries left in place  ? skin cancer removal    ? ? ?Current Medications: ?No outpatient medications have been marked as taking for the 04/20/21 encounter (Appointment) with [name], MD.  ?  ? ?Allergies:   Amlodipine, Darifenacin hydrobromide, Other, Sulfonamide derivatives, and Tramadol  ? ?Social History  ? ?Socioeconomic History  ? Marital status: Widowed  ?  Spouse name: Not on file  ? Number of children: 5  ? Years of education: Not on file  ? Highest education level: Not on file  ?Occupational History  ? Occupation: retired  ?  Employer: RETIRED  ?Tobacco Use  ? Smoking status: Never  ? Smokeless tobacco: Never  ?Vaping Use  ? Vaping Use: Never used  ?Substance and Sexual Activity  ? Alcohol use: No  ? Drug use: No  ? Sexual activity: Not on file  ?  Comment: lives with Daughter, grandson and his family. no dietary restricitons.  ?Other Topics Concern  ? Not on file  ?Social History Narrative  ? Not on file  ? ?Social Determinants of Health  ? ?Financial Resource Strain: Not on file  ?Food Insecurity: Not on file  ?Transportation Needs: Not on file  ?Physical Activity: Not on file  ?Stress: Not on file  ?Social Connections: Not on file  ?  ? ?Family History: ?The patient's family history includes Allergies in her daughter; COPD in her daughter, daughter, and father; CVA in her mother; Diabetes in her mother; Heart disease in her maternal aunt and maternal uncle; Rheum arthritis in her father; Stroke in her daughter, maternal grandfather, and mother. There is no history of Colon cancer, Colon polyps, Esophageal cancer, Gallbladder disease, Kidney disease, Heart attack, or Hypertension. ? ?ROS:   ?Review of Systems  ?Constitutional:  Negative for chills, diaphoresis and fever.  ?HENT:  Negative for sore throat.   ?Respiratory:  Negative for cough and shortness of breath.   ?Cardiovascular:   Positive for palpitations (skips in her heart beat). Negative for chest pain, orthopnea, leg swelling and PND.  ?Gastrointestinal:  Negative for abdominal pain, blood in stool, diarrhea, heartburn, nausea and vomiting.  ?Genitourinary:  Negative for dysuria, hematuria and urgency.  ?Neurological:  Negative for dizziness.  ? ? ?EKGs/Labs/Other Studies Reviewed:   ? ?The following studies were reviewed today: ?Monitor 09/2018 ?Study Highlights ?  ?  ?Persistent atrial flutter with variable block with minimum HR 45, maximum HR 136 and average HR 65 BPM. ?1 episode of ventricular tachycardia lasting 8 beats. ?  ?Persistent atrial flutter with variable block with minimum HR 45, maximum HR 136 and average HR 65 BPM. Continue current medical management.  ?  ?  ?Echo 2018 ?Study Conclusions  ? ?- Left ventricle: The cavity size was normal. There was mild  ?  concentric hypertrophy. Systolic function was normal. The  ?  estimated ejection fraction was in the range of 55% to 60%. Wall  ?  motion was normal; there were no regional wall motion  ?  abnormalities. The study is not technically sufficient to allow  ?  evaluation of LV diastolic function.  ?- Aortic valve: Transvalvular velocity was within the normal range.  ?  There was no stenosis. There was no regurgitation.  ?- Mitral valve: Transvalvular velocity was within the normal range.  ?  There was no evidence for stenosis. There was mild regurgitation.  ?- Left atrium: The atrium was moderately dilated.  ?- Right ventricle: Systolic function was normal.  ?- Atrial septum: No defect or patent foramen ovale was identified.  ?- Tricuspid valve: There was mild regurgitation.  ?- Pulmonary arteries: Systolic pressure was within the normal  ?  range. PA peak pressure: 36 mm Hg (S).  ?  ? ?EKGs ?05/18/20-EKG is not ordered today.  ? ?Recent Labs: ?10/24/2020: Magnesium 1.8; TSH 2.72 ?11/28/2020: ALT 9 ?03/22/2021: Hemoglobin 10.3; Platelets 244 ?03/24/2021: Pro B Natriuretic  peptide (BNP) 178.0 ?04/11/2021: BUN 30; Creatinine, Ser 1.22; Potassium 4.8; Sodium 139  ?Recent Lipid Panel ?   ?Component Value Date/Time  ? CHOL 117 10/24/2020 1518  ? TRIG 128.0 10/24/2020 1518  ? HDL 3

## 2021-04-20 ENCOUNTER — Encounter: Payer: Self-pay | Admitting: Cardiology

## 2021-04-20 ENCOUNTER — Ambulatory Visit: Payer: Medicare Other | Admitting: Cardiology

## 2021-04-20 VITALS — BP 124/72 | HR 71 | Ht 59.0 in | Wt 82.4 lb

## 2021-04-20 DIAGNOSIS — I10 Essential (primary) hypertension: Secondary | ICD-10-CM

## 2021-04-20 DIAGNOSIS — I4821 Permanent atrial fibrillation: Secondary | ICD-10-CM

## 2021-04-20 DIAGNOSIS — I080 Rheumatic disorders of both mitral and aortic valves: Secondary | ICD-10-CM

## 2021-04-20 DIAGNOSIS — E785 Hyperlipidemia, unspecified: Secondary | ICD-10-CM | POA: Diagnosis not present

## 2021-04-20 NOTE — Patient Instructions (Signed)
Medication Instructions:  ? ?STOP TAKING HYDRALAZINE NOW ? ?*If you need a refill on your cardiac medications before your next appointment, please call your pharmacy* ? ? ? ?Follow-Up: ?At [facility], you and your health needs are our priority.  As part of our continuing mission to provide you with exceptional heart care, we have created designated Provider Care Teams.  These Care Teams include your primary Cardiologist (physician) and Advanced Practice Providers (APPs -  Physician Assistants and Nurse Practitioners) who all work together to provide you with the care you need, when you need it. ? ?We recommend signing up for the patient portal called "MyChart".  Sign up information is provided on this After Visit Summary.  MyChart is used to connect with patients for Virtual Visits (Telemedicine).  Patients are able to view lab/test results, encounter notes, upcoming appointments, etc.  Non-urgent messages can be sent to your provider as well.   ?To learn more about what you can do with MyChart, go to [email or web address].   ? ?Your next appointment:   ?6 month(s) ? ?The format for your next appointment:   ?In Person ? ?Provider:   ?[name], MD { ? ? ?Important Information About Sugar ? ? ? ? ? ? ?

## 2021-04-20 NOTE — Progress Notes (Signed)
?Cardiology Office Note:   ? ?Date:  04/20/2021  ? ?ID:  [name] [name], DOB [birth year], MRN [id number] ? ?PCP:  Mosie Lukes, MD ?  ?[facility] Providers ?Cardiologist:  [name], MD { ?  ? ?Referring MD: Mosie Lukes, MD  ? ? ?History of Present Illness:   ? ?[name] [name] is a 88 y.o. female with a hx of persistent atrial fibrillation, HTN, sinus bradycardia, HLD, anemia, anxiety, arthritis, diverticulosis, GERD, and thyroid disease who was previously followed by Dr. [name] who now presents to clinic for follow-up. ? ?Per review of the record, she been felt to have had chronic atrial fib. TTE 2018 showed EF 55-60%, mild MR, moderate LAE, mild TR, normal PASP. She was seen in the ER 06/20/17 with discomfort in chest, upper abdomen and back. CT angio chest/abd/pelvis showed no acute dissection/ aneurysm, + atherosclerotic vascular disease with suspected renal artery stenosis, possible 16 mm hyperenhancing splenic mass, sigmoid diverticular disease . Out-patient w/u of splenic mass recommended. She was then re-hospitalized with weakness and decreased appetite found to be hyponatremic and slightly bradycardic.  She was treated for possible SIADH. Initially the patient was bradycardic and her beta-blocker was held but heart rate increased to greater than 100. She was seen in consultation by Dr. Marlou Porch who felt that her rhythm was actually atrial flutter appearing. He advised continued rate control on low-dose Toprol and watch for bradycardia. Her Coumadin was switched to Eliquis during begininng of Covid pandemic. Event monitoring in 09/2018 with findings of persistent atrial flutter with variable block with minimum HR 45, maximum HR 136 and average HR 65 BPM. 1 episode of ventricular tachycardia lasting 8 beats. ? ?Was last seen in clinic on 05/2020 where she was doing better. Palpitations were well controlled. Tolerated apixaban without issues. ? ?She is accompanied by a family member. Today, the  patient states that she is feeling pretty good. Lately she has been recovering from a cold, with some lingering congestion. She denies any pounding palpitations, chest pain, or recent falls. No bleeding issues on Eliquis. ? ?At one time while on Lasix she had a near-syncopal episode. She is not taking this now, and has not had any recurrent near-syncope or loss of consciousness. At this time she has a little bit of swelling in her feet. ? ?She has been taking hydralazine 2 times a day instead of 3 times. Also denies taking folic acid ? ?She endorses some difficulty with walking due to joint issues but is still able to walk with a walker. ? ?She denies any shortness of breath. No lightheadedness, headaches, orthopnea, or PND. ? ? ?Past Medical History:  ?Diagnosis Date  ? Anemia   ? Anxiety   ? Arthritis   ? hips, knees  ? Current use of long term anticoagulation   ? Diverticulitis of colon 02/06/2015  ? Gait difficulty   ? GERD (gastroesophageal reflux disease)   ? Headache(784.0) 06/22/2012  ? Hyperlipidemia   ? Hypertension   ? IBS (irritable bowel syndrome)   ? Incontinence   ? Loss of weight 06/05/2015  ? Medicare annual wellness visit, subsequent 02/05/2014  ? Melena   ? Mild mitral regurgitation   ? Mild tricuspid regurgitation   ? Mitral and aortic regurgitation   ? Mitral valve prolapse   ? hx of - not seen on recent echoes  ? Osteopenia   ? Paroxysmal atrial flutter ([place])   ? Pedal edema 10/01/2016  ? Persistent atrial fibrillation ([place])   ?  Personal history of colonic polyps   ? Renal lithiasis 06/26/2015  ? Sinus bradycardia   ? Thyroid disease   ? hypo  ? Vitamin B12 deficiency 03/16/2016  ? Vitamin D deficiency   ? ? ?Past Surgical History:  ?Procedure Laterality Date  ? BOTOX INJECTION N/A 09/03/2012  ? Procedure: BOTOX INJECTION;  Surgeon: Inda Castle, MD;  [facility];  Service: Endoscopy;  Laterality: N/A;  ? BREAST BIOPSY Left   ? BREAST LUMPECTOMY Right 08/18/2014  ? Procedure: RIGHT BREAST  LUMPECTOMY;  Surgeon: Coralie Keens, MD;  Location: [place];  Service: General;  Laterality: Right;  ? CARDIAC ELECTROPHYSIOLOGY MAPPING AND ABLATION    ? CATARACT EXTRACTION, BILATERAL    ? ESOPHAGOGASTRODUODENOSCOPY N/A 09/03/2012  ? Procedure: ESOPHAGOGASTRODUODENOSCOPY (EGD);  Surgeon: Inda Castle, MD;  Location: [facility];  Service: Endoscopy;  Laterality: N/A;  ? ESOPHAGOGASTRODUODENOSCOPY (EGD) WITH PROPOFOL N/A 12/02/2018  ? Procedure: ESOPHAGOGASTRODUODENOSCOPY (EGD) WITH PROPOFOL;  Surgeon: [name], MD;  [facility];  Service: Gastroenterology;  Laterality: N/A;  ? ESOPHAGOSCOPY W/ BOTOX INJECTION    ? I & D EXTREMITY Right 11/06/2012  ? Procedure: IRRIGATION AND DEBRIDEMENT EXTREMITY Right Ring Finger;  Surgeon: Tennis Must, MD;  Location: [place];  Service: Orthopedics;  Laterality: Right;  ? PARTIAL HYSTERECTOMY    ? ovaries left in place  ? skin cancer removal    ? ? ?Current Medications: ?Current Meds  ?Medication Sig  ? ALPRAZolam (XANAX) 0.25 MG tablet Take 0.5-1 tablets (0.125-0.25 mg total) by mouth 2 (two) times daily as needed for anxiety.  ? apixaban (ELIQUIS) 2.5 MG TABS tablet Take 1 tablet (2.5 mg total) by mouth 2 (two) times daily.  ? Cholecalciferol (VITAMIN D) 2000 UNITS CAPS Take 2,000 Units by mouth every other day.  ? famotidine (PEPCID) 20 MG tablet Take 40 mg by mouth at bedtime.   ? furosemide (LASIX) 20 MG tablet Take 1 tablet (20 mg total) by mouth as needed for fluid or edema (swelling or weight gain of 2lbs overnight or 5lbs in a week).  ? isosorbide mononitrate (IMDUR) 30 MG 24 hr tablet Take 1 tablet (30 mg total) by mouth daily. Please keep upcoming appt in April 2023 with Dr. Johney Frame before anymore refills. Thank you Final Attempt  ? levocetirizine (XYZAL) 5 MG tablet TAKE 1/2 TABLET (2.5 MG TOTAL) BY MOUTH EVERY EVENING.  ? losartan (COZAAR) 25 MG tablet Take 1 tablet (25 mg total) by mouth daily.  ? metoprolol succinate  (TOPROL XL) 25 MG 24 hr tablet Take 1 tablet (25 mg total) by mouth daily.  ? pantoprazole (PROTONIX) 40 MG tablet TAKE 1 TABLET BY MOUTH EVERY DAY  ? [DISCONTINUED] hydrALAZINE (APRESOLINE) 10 MG tablet TAKE 1 TABLET BY MOUTH THREE TIMES A DAY  ?  ? ?Allergies:   Amlodipine, Darifenacin hydrobromide, Other, Sulfonamide derivatives, and Tramadol  ? ?Social History  ? ?Socioeconomic History  ? Marital status: Widowed  ?  Spouse name: Not on file  ? Number of children: 5  ? Years of education: Not on file  ? Highest education level: Not on file  ?Occupational History  ? Occupation: retired  ?  Employer: RETIRED  ?Tobacco Use  ? Smoking status: Never  ? Smokeless tobacco: Never  ?Vaping Use  ? Vaping Use: Never used  ?Substance and Sexual Activity  ? Alcohol use: No  ? Drug use: No  ? Sexual activity: Not on file  ?  Comment:  lives with Daughter, grandson and his family. no dietary restricitons.  ?Other Topics Concern  ? Not on file  ?Social History Narrative  ? Not on file  ? ?Social Determinants of Health  ? ?Financial Resource Strain: Not on file  ?Food Insecurity: Not on file  ?Transportation Needs: Not on file  ?Physical Activity: Not on file  ?Stress: Not on file  ?Social Connections: Not on file  ?  ? ?Family History: ?The patient's family history includes Allergies in her daughter; COPD in her daughter, daughter, and father; CVA in her mother; Diabetes in her mother; Heart disease in her maternal aunt and maternal uncle; Rheum arthritis in her father; Stroke in her daughter, maternal grandfather, and mother. There is no history of Colon cancer, Colon polyps, Esophageal cancer, Gallbladder disease, Kidney disease, Heart attack, or Hypertension. ? ?ROS:   ?Review of Systems  ?Constitutional:  Negative for chills, diaphoresis and fever.  ?HENT:  Positive for congestion. Negative for nosebleeds and sore throat.   ?Eyes:  Negative for discharge.  ?Respiratory:  Negative for cough and shortness of breath.    ?Cardiovascular:  Positive for leg swelling. Negative for chest pain, palpitations, orthopnea, claudication and PND.  ?Gastrointestinal:  Negative for abdominal pain, blood in stool, diarrhea, heartburn, nausea and vom

## 2021-04-28 ENCOUNTER — Other Ambulatory Visit: Payer: Self-pay | Admitting: Physician Assistant

## 2021-04-28 DIAGNOSIS — I4819 Other persistent atrial fibrillation: Secondary | ICD-10-CM

## 2021-04-28 NOTE — Telephone Encounter (Signed)
Eliquis 2.'5mg'$  refill request received. Patient is 88 years old, weight-37.4kg, Crea-1.22 on 04/11/2021, Diagnosis-Afib, and last seen by Dr. Johney Frame on 04/20/2021. Dose is appropriate based on dosing criteria. Will send in refill to requested pharmacy.   ?

## 2021-05-15 NOTE — Progress Notes (Deleted)
Subjective:    Patient ID: [name] [name], female    DOB: 1924/08/07, 88 y.o.   MRN: [id number]  No chief complaint on file.   HPI Patient is in today for a follow up.  Past Medical History:  Diagnosis Date   Anemia    Anxiety    Arthritis    hips, knees   Current use of long term anticoagulation    Diverticulitis of colon 02/06/2015   Gait difficulty    GERD (gastroesophageal reflux disease)    Headache(784.0) 06/22/2012   Hyperlipidemia    Hypertension    IBS (irritable bowel syndrome)    Incontinence    Loss of weight 06/05/2015   Medicare annual wellness visit, subsequent 02/05/2014   Melena    Mild mitral regurgitation    Mild tricuspid regurgitation    Mitral and aortic regurgitation    Mitral valve prolapse    hx of - not seen on recent echoes   Osteopenia    Paroxysmal atrial flutter ([place])    Pedal edema 10/01/2016   Persistent atrial fibrillation (HCC)    Personal history of colonic polyps    Renal lithiasis 06/26/2015   Sinus bradycardia    Thyroid disease    hypo   Vitamin B12 deficiency 03/16/2016   Vitamin D deficiency     Past Surgical History:  Procedure Laterality Date   BOTOX INJECTION N/A 09/03/2012   Procedure: BOTOX INJECTION;  Surgeon: Inda Castle, MD;  Location: [facility];  Service: Endoscopy;  Laterality: N/A;   BREAST BIOPSY Left    BREAST LUMPECTOMY Right 08/18/2014   Procedure: RIGHT BREAST LUMPECTOMY;  Surgeon: Coralie Keens, MD;  Location: [facility];  Service: General;  Laterality: Right;   CARDIAC ELECTROPHYSIOLOGY MAPPING AND ABLATION     CATARACT EXTRACTION, BILATERAL     ESOPHAGOGASTRODUODENOSCOPY N/A 09/03/2012   Procedure: ESOPHAGOGASTRODUODENOSCOPY (EGD);  Surgeon: Inda Castle, MD;  Location: [facility];  Service: Endoscopy;  Laterality: N/A;   ESOPHAGOGASTRODUODENOSCOPY (EGD) WITH PROPOFOL N/A 12/02/2018   Procedure: ESOPHAGOGASTRODUODENOSCOPY (EGD) WITH PROPOFOL;  Surgeon: [name], MD;   Location: [facility];  Service: Gastroenterology;  Laterality: N/A;   ESOPHAGOSCOPY W/ BOTOX INJECTION     I & D EXTREMITY Right 11/06/2012   Procedure: IRRIGATION AND DEBRIDEMENT EXTREMITY Right Ring Finger;  Surgeon: Tennis Must, MD;  Location: [facility];  Service: Orthopedics;  Laterality: Right;   PARTIAL HYSTERECTOMY     ovaries left in place   skin cancer removal      Family History  Problem Relation Age of Onset   Diabetes Mother    CVA Mother    Stroke Mother    COPD Father    Rheum arthritis Father    Allergies Daughter    COPD Daughter    Stroke Daughter    COPD Daughter        previous smoker   Stroke Maternal Grandfather    Heart disease Maternal Aunt    Heart disease Maternal Uncle    Colon cancer Neg Hx    Colon polyps Neg Hx    Esophageal cancer Neg Hx    Gallbladder disease Neg Hx    Kidney disease Neg Hx    Heart attack Neg Hx    Hypertension Neg Hx     Social History   Socioeconomic History   Marital status: Widowed    Spouse name: Not on file   Number of children: 5   Years of education:  Not on file   Highest education level: Not on file  Occupational History   Occupation: retired    Fish farm manager: RETIRED  Tobacco Use   Smoking status: Never   Smokeless tobacco: Never  Vaping Use   Vaping Use: Never used  Substance and Sexual Activity   Alcohol use: No   Drug use: No   Sexual activity: Not on file    Comment: lives with Daughter, grandson and his family. no dietary restricitons.  Other Topics Concern   Not on file  Social History Narrative   Not on file   Social Determinants of Health   Financial Resource Strain: Not on file  Food Insecurity: Not on file  Transportation Needs: Not on file  Physical Activity: Not on file  Stress: Not on file  Social Connections: Not on file  Intimate Partner Violence: Not on file    Outpatient Medications Prior to Visit  Medication Sig Dispense Refill   ALPRAZolam (XANAX) 0.25 MG tablet Take 0.5-1  tablets (0.125-0.25 mg total) by mouth 2 (two) times daily as needed for anxiety. 30 tablet 1   apixaban (ELIQUIS) 2.5 MG TABS tablet TAKE 1 TABLET BY MOUTH TWICE A DAY 60 tablet 10   azithromycin (ZITHROMAX) 250 MG tablet Take 2 tablets by mouth first day, then 1 tablet for 4 additional days (Patient not taking: Reported on 04/20/2021) 6 tablet 0   Cholecalciferol (VITAMIN D) 2000 UNITS CAPS Take 2,000 Units by mouth every other day.     famotidine (PEPCID) 20 MG tablet Take 40 mg by mouth at bedtime.      Ferrous Fumarate-Folic Acid (HEMOCYTE-F) 324-1 MG TABS Take 1 tablet by mouth daily. (Patient not taking: Reported on 04/20/2021) 30 tablet 5   furosemide (LASIX) 20 MG tablet Take 1 tablet (20 mg total) by mouth as needed for fluid or edema (swelling or weight gain of 2lbs overnight or 5lbs in a week). 90 tablet 3   isosorbide mononitrate (IMDUR) 30 MG 24 hr tablet Take 1 tablet (30 mg total) by mouth daily. Please keep upcoming appt in April 2023 with Dr. Johney Frame before anymore refills. Thank you Final Attempt 90 tablet 0   levocetirizine (XYZAL) 5 MG tablet TAKE 1/2 TABLET (2.5 MG TOTAL) BY MOUTH EVERY EVENING. 45 tablet 1   losartan (COZAAR) 25 MG tablet Take 1 tablet (25 mg total) by mouth daily. 90 tablet 0   metoprolol succinate (TOPROL XL) 25 MG 24 hr tablet Take 1 tablet (25 mg total) by mouth daily. 90 tablet 0   pantoprazole (PROTONIX) 40 MG tablet TAKE 1 TABLET BY MOUTH EVERY DAY 90 tablet 1   No facility-administered medications prior to visit.    Allergies  Allergen Reactions   Amlodipine Shortness Of Breath   Darifenacin Hydrobromide Other (See Comments)    dizziness   Other Other (See Comments)    dizziness   Sulfonamide Derivatives Other (See Comments)    dizziness   Tramadol Other (See Comments)    Insomnia, anorexia    ROS     Objective:    Physical Exam  There were no vitals taken for this visit. Wt Readings from Last 3 Encounters:  04/20/21 82 lb 6.4 oz  (37.4 kg)  04/14/21 83 lb 4 oz (37.8 kg)  04/04/21 85 lb 8 oz (38.8 kg)    Diabetic Foot Exam - Simple   No data filed    Lab Results  Component Value Date   WBC 7.2 03/22/2021   HGB 10.3 (  L) 03/22/2021   HCT 32.4 (L) 03/22/2021   PLT 244 03/22/2021   GLUCOSE 136 (H) 04/11/2021   CHOL 117 10/24/2020   TRIG 128.0 10/24/2020   HDL 34.40 (L) 10/24/2020   LDLDIRECT 72.1 01/19/2009   LDLCALC 57 10/24/2020   ALT 9 11/28/2020   AST 15 11/28/2020   NA 139 04/11/2021   K 4.8 04/11/2021   CL 102 04/11/2021   CREATININE 1.22 (H) 04/11/2021   BUN 30 04/11/2021   CO2 25 04/11/2021   TSH 2.72 10/24/2020   INR 2.1 04/25/2018   HGBA1C 5.5 10/24/2020    Lab Results  Component Value Date   TSH 2.72 10/24/2020   Lab Results  Component Value Date   WBC 7.2 03/22/2021   HGB 10.3 (L) 03/22/2021   HCT 32.4 (L) 03/22/2021   MCV 91.8 03/22/2021   PLT 244 03/22/2021   Lab Results  Component Value Date   NA 139 04/11/2021   K 4.8 04/11/2021   CO2 25 04/11/2021   GLUCOSE 136 (H) 04/11/2021   BUN 30 04/11/2021   CREATININE 1.22 (H) 04/11/2021   BILITOT 0.4 11/28/2020   ALKPHOS 15 (L) 11/28/2020   AST 15 11/28/2020   ALT 9 11/28/2020   PROT 6.7 11/28/2020   ALBUMIN 3.2 (L) 11/28/2020   CALCIUM 8.4 (L) 04/11/2021   ANIONGAP 8 03/22/2021   EGFR 41 (L) 04/11/2021   GFR 47.42 (L) 03/24/2021   Lab Results  Component Value Date   CHOL 117 10/24/2020   Lab Results  Component Value Date   HDL 34.40 (L) 10/24/2020   Lab Results  Component Value Date   LDLCALC 57 10/24/2020   Lab Results  Component Value Date   TRIG 128.0 10/24/2020   Lab Results  Component Value Date   CHOLHDL 3 10/24/2020   Lab Results  Component Value Date   HGBA1C 5.5 10/24/2020       Assessment & Plan:   Problem List Items Addressed This Visit   None   I am having [name] [name] maintain her Vitamin D, famotidine, ALPRAZolam, Hemocyte-F, metoprolol succinate, pantoprazole,  levocetirizine, isosorbide mononitrate, losartan, furosemide, azithromycin, and Eliquis.  No orders of the defined types were placed in this encounter.

## 2021-05-16 ENCOUNTER — Ambulatory Visit (INDEPENDENT_AMBULATORY_CARE_PROVIDER_SITE_OTHER): Payer: Medicare Other | Admitting: Family

## 2021-05-16 ENCOUNTER — Ambulatory Visit: Payer: Medicare Other | Admitting: Family Medicine

## 2021-05-16 ENCOUNTER — Encounter: Payer: Self-pay | Admitting: Family

## 2021-05-16 DIAGNOSIS — I272 Pulmonary hypertension, unspecified: Secondary | ICD-10-CM | POA: Diagnosis not present

## 2021-05-16 DIAGNOSIS — R600 Localized edema: Secondary | ICD-10-CM | POA: Diagnosis not present

## 2021-05-16 DIAGNOSIS — E440 Moderate protein-calorie malnutrition: Secondary | ICD-10-CM

## 2021-05-16 DIAGNOSIS — R739 Hyperglycemia, unspecified: Secondary | ICD-10-CM

## 2021-05-16 DIAGNOSIS — K219 Gastro-esophageal reflux disease without esophagitis: Secondary | ICD-10-CM | POA: Diagnosis not present

## 2021-05-16 DIAGNOSIS — N289 Disorder of kidney and ureter, unspecified: Secondary | ICD-10-CM

## 2021-05-16 DIAGNOSIS — I10 Essential (primary) hypertension: Secondary | ICD-10-CM | POA: Diagnosis not present

## 2021-05-16 DIAGNOSIS — F411 Generalized anxiety disorder: Secondary | ICD-10-CM

## 2021-05-16 DIAGNOSIS — I4819 Other persistent atrial fibrillation: Secondary | ICD-10-CM | POA: Diagnosis not present

## 2021-05-16 DIAGNOSIS — E039 Hypothyroidism, unspecified: Secondary | ICD-10-CM

## 2021-05-16 DIAGNOSIS — E782 Mixed hyperlipidemia: Secondary | ICD-10-CM

## 2021-05-16 DIAGNOSIS — D649 Anemia, unspecified: Secondary | ICD-10-CM

## 2021-05-16 MED ORDER — METOPROLOL SUCCINATE ER 25 MG PO TB24: 25.0000 mg | ORAL_TABLET | Freq: Every day | ORAL | 1 refills | Status: DC

## 2021-05-16 MED ORDER — LOSARTAN POTASSIUM 25 MG PO TABS
25.0000 mg | ORAL_TABLET | Freq: Every day | ORAL | 1 refills | Status: DC
Start: 2021-05-16 — End: 2021-11-17

## 2021-05-16 NOTE — Assessment & Plan Note (Signed)
Wt Readings from Last 3 Encounters:  ?05/16/21 85 lb (38.6 kg)  ?04/20/21 82 lb 6.4 oz (37.4 kg)  ?04/14/21 83 lb 4 oz (37.8 kg)  ? ?Weight is up a few pounds. Reports good appetite. Monitor.  ?

## 2021-05-16 NOTE — Assessment & Plan Note (Signed)
Notes some occasional epigastric discomfort/gerd symptoms. Continue protonix/famotidine.  Monitor.  ?

## 2021-05-16 NOTE — Assessment & Plan Note (Signed)
She has lasix on hand for PRN use but notes that she "just doesn't like the way it makes me feel." She denies any orthopnea or DOE.  Continue PRN as tolerated. ?

## 2021-05-16 NOTE — Assessment & Plan Note (Signed)
Rate stable, maintained on eliquis per cardiology.  ?

## 2021-05-16 NOTE — Progress Notes (Signed)
? ?Subjective:  ? ?By signing my name below, I, Carylon Perches, attest that this documentation has been prepared under the direction and in the presence of Debbrah Alar NP, 05/16/2021   ? ? Patient ID: [name] [name], female    DOB: Apr 13, 1924, 88 y.o.   MRN: [id number] ? ?Chief Complaint  ?Patient presents with  ? Hypertension  ?  Here for follow up  ? ? ?HPI ?Patient is in today for an office visit. She is with her daughter.  ? ?Refill - She is requesting a refill of 25 Mg of Metoprolol Succinate ?Sleep - She states that she does not sleep much during the night. She lays at 9 pm and gets out of bed around 6 am. She denies of any myalgia. She reports that she drinks decaf coffee in the morning.  ?Abdominal Pain - She states that she occasionally has pain in the umbilical region of her abdomen. She reports that she does have a history of acid reflux. She is taking 40 Mg of Protonix.  ?Swollen Feet - Her daughter states that the patients feet are swollen. The patient reports that she does not take her 20 Mg of Lasix because it makes her feel bad and sluggish. She denies of any SOB when laying flat or routine walking.  ?Blood Pressure -Her blood pressure is elevating. During today's visit, her blood pressure reading is 155/60. ?BP Readings from Last 3 Encounters:  ?05/16/21 (!) 155/60  ?04/20/21 124/72  ?04/14/21 126/84  ? ?Pulse Readings from Last 3 Encounters:  ?05/16/21 61  ?04/20/21 71  ?04/14/21 70  ?Anxiety - She reports that she doesn't take 0.25 Mg of Xanax often. She occasionally takes it for rest.  ?Allergies - She does not take 5 Mg of Xyzal.  ?Esophageal Obstruction - She states that she occasionally feels like food is stuck in her esophagus.  ?Appetite - She states that her appetite is good. Occasionally, she doesn't have the desire to eat but states that feeling is not very often.  ? ?Health Maintenance Due  ?Topic Date Due  ? Zoster Vaccines- Shingrix (1 of 2) Never done  ? TETANUS/TDAP  10/29/2020   ? ? ?Past Medical History:  ?Diagnosis Date  ? Anemia   ? Anxiety   ? Arthritis   ? hips, knees  ? Current use of long term anticoagulation   ? Diverticulitis of colon 02/06/2015  ? Gait difficulty   ? GERD (gastroesophageal reflux disease)   ? Headache(784.0) 06/22/2012  ? Hyperlipidemia   ? Hypertension   ? IBS (irritable bowel syndrome)   ? Incontinence   ? Loss of weight 06/05/2015  ? Medicare annual wellness visit, subsequent 02/05/2014  ? Melena   ? Mild mitral regurgitation   ? Mild tricuspid regurgitation   ? Mitral and aortic regurgitation   ? Mitral valve prolapse   ? hx of - not seen on recent echoes  ? Osteopenia   ? Paroxysmal atrial flutter ([place])   ? Pedal edema 10/01/2016  ? Persistent atrial fibrillation ([place])   ? Personal history of colonic polyps   ? Renal lithiasis 06/26/2015  ? Sinus bradycardia   ? Thyroid disease   ? hypo  ? Vitamin B12 deficiency 03/16/2016  ? Vitamin D deficiency   ? ? ?Past Surgical History:  ?Procedure Laterality Date  ? BOTOX INJECTION N/A 09/03/2012  ? Procedure: BOTOX INJECTION;  Surgeon: Inda Castle, MD;  Location: [facility];  Service: Endoscopy;  Laterality: N/A;  ?  BREAST BIOPSY Left   ? BREAST LUMPECTOMY Right 08/18/2014  ? Procedure: RIGHT BREAST LUMPECTOMY;  Surgeon: Coralie Keens, MD;  Location: [facility];  Service: General;  Laterality: Right;  ? CARDIAC ELECTROPHYSIOLOGY MAPPING AND ABLATION    ? CATARACT EXTRACTION, BILATERAL    ? ESOPHAGOGASTRODUODENOSCOPY N/A 09/03/2012  ? Procedure: ESOPHAGOGASTRODUODENOSCOPY (EGD);  Surgeon: Inda Castle, MD;  Location: [facility];  Service: Endoscopy;  Laterality: N/A;  ? ESOPHAGOGASTRODUODENOSCOPY (EGD) WITH PROPOFOL N/A 12/02/2018  ? Procedure: ESOPHAGOGASTRODUODENOSCOPY (EGD) WITH PROPOFOL;  Surgeon: [name], MD;  Location: [facility];  Service: Gastroenterology;  Laterality: N/A;  ? ESOPHAGOSCOPY W/ BOTOX INJECTION    ? I & D EXTREMITY Right 11/06/2012  ? Procedure: IRRIGATION AND  DEBRIDEMENT EXTREMITY Right Ring Finger;  Surgeon: Tennis Must, MD;  Location: [facility];  Service: Orthopedics;  Laterality: Right;  ? PARTIAL HYSTERECTOMY    ? ovaries left in place  ? skin cancer removal    ? ? ?Family History  ?Problem Relation Age of Onset  ? Diabetes Mother   ? CVA Mother   ? Stroke Mother   ? COPD Father   ? Rheum arthritis Father   ? Allergies Daughter   ? COPD Daughter   ? Stroke Daughter   ? COPD Daughter   ?     previous smoker  ? Stroke Maternal Grandfather   ? Heart disease Maternal Aunt   ? Heart disease Maternal Uncle   ? Colon cancer Neg Hx   ? Colon polyps Neg Hx   ? Esophageal cancer Neg Hx   ? Gallbladder disease Neg Hx   ? Kidney disease Neg Hx   ? Heart attack Neg Hx   ? Hypertension Neg Hx   ? ? ?Social History  ? ?Socioeconomic History  ? Marital status: Widowed  ?  Spouse name: Not on file  ? Number of children: 5  ? Years of education: Not on file  ? Highest education level: Not on file  ?Occupational History  ? Occupation: retired  ?  Employer: RETIRED  ?Tobacco Use  ? Smoking status: Never  ? Smokeless tobacco: Never  ?Vaping Use  ? Vaping Use: Never used  ?Substance and Sexual Activity  ? Alcohol use: No  ? Drug use: No  ? Sexual activity: Not on file  ?  Comment: lives with Daughter, grandson and his family. no dietary restricitons.  ?Other Topics Concern  ? Not on file  ?Social History Narrative  ? Not on file  ? ?Social Determinants of Health  ? ?Financial Resource Strain: Not on file  ?Food Insecurity: Not on file  ?Transportation Needs: Not on file  ?Physical Activity: Not on file  ?Stress: Not on file  ?Social Connections: Not on file  ?Intimate Partner Violence: Not on file  ? ? ?Outpatient Medications Prior to Visit  ?Medication Sig Dispense Refill  ? ALPRAZolam (XANAX) 0.25 MG tablet Take 0.5-1 tablets (0.125-0.25 mg total) by mouth 2 (two) times daily as needed for anxiety. 30 tablet 1  ? apixaban (ELIQUIS) 2.5 MG TABS tablet TAKE 1 TABLET BY MOUTH TWICE A DAY 60  tablet 10  ? Cholecalciferol (VITAMIN D) 2000 UNITS CAPS Take 2,000 Units by mouth every other day.    ? famotidine (PEPCID) 20 MG tablet Take 40 mg by mouth at bedtime.     ? Ferrous Fumarate-Folic Acid (HEMOCYTE-F) 324-1 MG TABS Take 1 tablet by mouth daily. (Patient not taking: Reported on 04/20/2021) 30  tablet 5  ? furosemide (LASIX) 20 MG tablet Take 1 tablet (20 mg total) by mouth as needed for fluid or edema (swelling or weight gain of 2lbs overnight or 5lbs in a week). 90 tablet 3  ? isosorbide mononitrate (IMDUR) 30 MG 24 hr tablet Take 1 tablet (30 mg total) by mouth daily. Please keep upcoming appt in April 2023 with Dr. Johney Frame before anymore refills. Thank you Final Attempt 90 tablet 0  ? pantoprazole (PROTONIX) 40 MG tablet TAKE 1 TABLET BY MOUTH EVERY DAY 90 tablet 1  ? azithromycin (ZITHROMAX) 250 MG tablet Take 2 tablets by mouth first day, then 1 tablet for 4 additional days (Patient not taking: Reported on 04/20/2021) 6 tablet 0  ? levocetirizine (XYZAL) 5 MG tablet TAKE 1/2 TABLET (2.5 MG TOTAL) BY MOUTH EVERY EVENING. 45 tablet 1  ? losartan (COZAAR) 25 MG tablet Take 1 tablet (25 mg total) by mouth daily. 90 tablet 0  ? metoprolol succinate (TOPROL XL) 25 MG 24 hr tablet Take 1 tablet (25 mg total) by mouth daily. 90 tablet 0  ? ?No facility-administered medications prior to visit.  ? ? ?Allergies  ?Allergen Reactions  ? Amlodipine Shortness Of Breath  ? Darifenacin Hydrobromide Other (See Comments)  ?  dizziness  ? Other Other (See Comments)  ?  dizziness  ? Sulfonamide Derivatives Other (See Comments)  ?  dizziness  ? Tramadol Other (See Comments)  ?  Insomnia, anorexia  ? ? ?Review of Systems  ?Respiratory:  Negative for shortness of breath.   ?Cardiovascular:  Positive for leg swelling.  ?Gastrointestinal:  Positive for abdominal pain (Umbilical Region).  ?     (+) Esophageal Obstruction  ?Musculoskeletal:  Negative for myalgias.  ? ?   ?Objective:  ?  ?Physical Exam ?Constitutional:   ?    General: She is not in acute distress. ?   Appearance: Normal appearance. She is not ill-appearing.  ?HENT:  ?   Head: Normocephalic and atraumatic.  ?   Right Ear: External ear normal.  ?   Left Ear: External

## 2021-05-16 NOTE — Assessment & Plan Note (Signed)
Lab Results  ?Component Value Date  ? HGBA1C 5.5 10/24/2020  ? ?Last A1C WNL.  Continue to monitor.  ?

## 2021-05-16 NOTE — Assessment & Plan Note (Addendum)
Continues xanax prn.  Anxiety is stable. She continues to have some issues with insomnia. Reviewed good sleep hygiene techniques. Controlled substance contract is updated.  ?

## 2021-05-16 NOTE — Assessment & Plan Note (Signed)
BP a bit elevated today. However, given her advanced age and increased fall risk, will not further adjust her antihypertensives.  ?BP Readings from Last 3 Encounters:  ?05/16/21 (!) 155/60  ?04/20/21 124/72  ?04/14/21 126/84  ? ?Continue metoprolol and losartan.  ?

## 2021-08-10 ENCOUNTER — Other Ambulatory Visit: Payer: Self-pay

## 2021-08-10 DIAGNOSIS — R079 Chest pain, unspecified: Secondary | ICD-10-CM

## 2021-08-10 MED ORDER — ISOSORBIDE MONONITRATE ER 30 MG PO TB24: 30.0000 mg | ORAL_TABLET | Freq: Every day | ORAL | 2 refills | Status: DC

## 2021-09-05 ENCOUNTER — Other Ambulatory Visit: Payer: Self-pay | Admitting: Family

## 2021-09-05 ENCOUNTER — Other Ambulatory Visit: Payer: Self-pay | Admitting: Family Medicine

## 2021-09-05 DIAGNOSIS — I10 Essential (primary) hypertension: Secondary | ICD-10-CM

## 2021-09-10 ENCOUNTER — Other Ambulatory Visit: Payer: Self-pay | Admitting: Family Medicine

## 2021-09-10 DIAGNOSIS — G4700 Insomnia, unspecified: Secondary | ICD-10-CM

## 2021-09-11 NOTE — Telephone Encounter (Signed)
Requesting: alprazolam 0.'25mg'$   Contract:02/05/2014 UDS: 05/09/2017 Last Visit: 05/16/21 w/ Lenna Sciara Next Visit: 09/18/21 Last Refill: 10/24/20 #30 and 1RF  Please Advise

## 2021-09-17 NOTE — Progress Notes (Unsigned)
Subjective:    Patient ID: [name] [name], female    DOB: [birth year], 88 y.o.   MRN: [id number]  No chief complaint on file.   HPI Patient is in today for ***  Past Medical History:  Diagnosis Date  . Anemia   . Anxiety   . Arthritis    hips, knees  . Current use of long term anticoagulation   . Diverticulitis of colon 02/06/2015  . Gait difficulty   . GERD (gastroesophageal reflux disease)   . Headache(784.0) 06/22/2012  . Hyperlipidemia   . Hypertension   . IBS (irritable bowel syndrome)   . Incontinence   . Loss of weight 06/05/2015  . Medicare annual wellness visit, subsequent 02/05/2014  . Melena   . Mild mitral regurgitation   . Mild tricuspid regurgitation   . Mitral and aortic regurgitation   . Mitral valve prolapse    hx of - not seen on recent echoes  . Osteopenia   . Paroxysmal atrial flutter ([place])   . Pedal edema 10/01/2016  . Persistent atrial fibrillation ([place])   . Personal history of colonic polyps   . Renal lithiasis 06/26/2015  . Sinus bradycardia   . Thyroid disease    hypo  . Vitamin B12 deficiency 03/16/2016  . Vitamin D deficiency     Past Surgical History:  Procedure Laterality Date  . BOTOX INJECTION N/A 09/03/2012   Procedure: BOTOX INJECTION;  Surgeon: Inda Castle, MD;  Location: [facility];  Service: Endoscopy;  Laterality: N/A;  . BREAST BIOPSY Left   . BREAST LUMPECTOMY Right 08/18/2014   Procedure: RIGHT BREAST LUMPECTOMY;  Surgeon: Coralie Keens, MD;  Location: [facility];  Service: General;  Laterality: Right;  . CARDIAC ELECTROPHYSIOLOGY MAPPING AND ABLATION    . CATARACT EXTRACTION, BILATERAL    . ESOPHAGOGASTRODUODENOSCOPY N/A 09/03/2012   Procedure: ESOPHAGOGASTRODUODENOSCOPY (EGD);  Surgeon: Inda Castle, MD;  Location: [facility];  Service: Endoscopy;  Laterality: N/A;  . ESOPHAGOGASTRODUODENOSCOPY (EGD) WITH PROPOFOL N/A 12/02/2018   Procedure: ESOPHAGOGASTRODUODENOSCOPY (EGD) WITH PROPOFOL;  Surgeon:  [name], MD;  Location: [facility];  Service: Gastroenterology;  Laterality: N/A;  . ESOPHAGOSCOPY W/ BOTOX INJECTION    . I & D EXTREMITY Right 11/06/2012   Procedure: IRRIGATION AND DEBRIDEMENT EXTREMITY Right Ring Finger;  Surgeon: Tennis Must, MD;  Location: [facility];  Service: Orthopedics;  Laterality: Right;  . PARTIAL HYSTERECTOMY     ovaries left in place  . skin cancer removal      Family History  Problem Relation Age of Onset  . Diabetes Mother   . CVA Mother   . Stroke Mother   . COPD Father   . Rheum arthritis Father   . Allergies Daughter   . COPD Daughter   . Stroke Daughter   . COPD Daughter        previous smoker  . Stroke Maternal Grandfather   . Heart disease Maternal Aunt   . Heart disease Maternal Uncle   . Colon cancer Neg Hx   . Colon polyps Neg Hx   . Esophageal cancer Neg Hx   . Gallbladder disease Neg Hx   . Kidney disease Neg Hx   . Heart attack Neg Hx   . Hypertension Neg Hx     Social History   Socioeconomic History  . Marital status: Widowed    Spouse name: Not on file  . Number of children: 5  . Years of education: Not on  file  . Highest education level: Not on file  Occupational History  . Occupation: retired    Fish farm manager: RETIRED  Tobacco Use  . Smoking status: Never  . Smokeless tobacco: Never  Vaping Use  . Vaping Use: Never used  Substance and Sexual Activity  . Alcohol use: No  . Drug use: No  . Sexual activity: Not on file    Comment: lives with Daughter, grandson and his family. no dietary restricitons.  Other Topics Concern  . Not on file  Social History Narrative  . Not on file   Social Determinants of Health   Financial Resource Strain: Not on file  Food Insecurity: Not on file  Transportation Needs: Not on file  Physical Activity: Not on file  Stress: Not on file  Social Connections: Not on file  Intimate Partner Violence: Not on file    Outpatient Medications Prior to Visit  Medication Sig  Dispense Refill  . ALPRAZolam (XANAX) 0.25 MG tablet TAKE 0.5-1 TABLETS (0.125-0.25 MG TOTAL) BY MOUTH 2 (TWO) TIMES DAILY AS NEEDED FOR ANXIETY. 30 tablet 1  . apixaban (ELIQUIS) 2.5 MG TABS tablet TAKE 1 TABLET BY MOUTH TWICE A DAY 60 tablet 10  . Cholecalciferol (VITAMIN D) 2000 UNITS CAPS Take 2,000 Units by mouth every other day.    . famotidine (PEPCID) 20 MG tablet Take 40 mg by mouth at bedtime.     . Ferrous Fumarate-Folic Acid (HEMOCYTE-F) 324-1 MG TABS Take 1 tablet by mouth daily. (Patient not taking: Reported on 04/20/2021) 30 tablet 5  . furosemide (LASIX) 20 MG tablet Take 1 tablet (20 mg total) by mouth as needed for fluid or edema (swelling or weight gain of 2lbs overnight or 5lbs in a week). 90 tablet 3  . hydrALAZINE (APRESOLINE) 10 MG tablet Take 1 tablet (10 mg total) by mouth 3 (three) times daily. 270 tablet 1  . isosorbide mononitrate (IMDUR) 30 MG 24 hr tablet Take 1 tablet (30 mg total) by mouth daily. 90 tablet 2  . losartan (COZAAR) 25 MG tablet Take 1 tablet (25 mg total) by mouth daily. 90 tablet 1  . metoprolol succinate (TOPROL-XL) 25 MG 24 hr tablet TAKE 1 TABLET (25 MG TOTAL) BY MOUTH DAILY. 90 tablet 1  . pantoprazole (PROTONIX) 40 MG tablet Take 1 tablet (40 mg total) by mouth daily. 90 tablet 1   No facility-administered medications prior to visit.    Allergies  Allergen Reactions  . Amlodipine Shortness Of Breath  . Darifenacin Hydrobromide Other (See Comments)    dizziness  . Other Other (See Comments)    dizziness  . Sulfonamide Derivatives Other (See Comments)    dizziness  . Tramadol Other (See Comments)    Insomnia, anorexia    Review of Systems  Constitutional:  Negative for fever and malaise/fatigue.  HENT:  Negative for congestion.   Eyes:  Negative for blurred vision.  Respiratory:  Negative for shortness of breath.   Cardiovascular:  Negative for chest pain, palpitations and leg swelling.  Gastrointestinal:  Negative for abdominal  pain, blood in stool and nausea.  Genitourinary:  Negative for dysuria and frequency.  Musculoskeletal:  Negative for falls.  Skin:  Negative for rash.  Neurological:  Negative for dizziness, loss of consciousness and headaches.  Endo/Heme/Allergies:  Negative for environmental allergies.  Psychiatric/Behavioral:  Negative for depression. The patient is not nervous/anxious.       Objective:    Physical Exam Constitutional:      General: She is  not in acute distress.    Appearance: She is well-developed.  HENT:     Head: Normocephalic and atraumatic.  Eyes:     Conjunctiva/sclera: Conjunctivae normal.  Neck:     Thyroid: No thyromegaly.  Cardiovascular:     Rate and Rhythm: Normal rate and regular rhythm.     Heart sounds: Normal heart sounds. No murmur heard. Pulmonary:     Effort: Pulmonary effort is normal. No respiratory distress.     Breath sounds: Normal breath sounds.  Abdominal:     General: Bowel sounds are normal. There is no distension.     Palpations: Abdomen is soft. There is no mass.     Tenderness: There is no abdominal tenderness.  Musculoskeletal:     Cervical back: Neck supple.  Lymphadenopathy:     Cervical: No cervical adenopathy.  Skin:    General: Skin is warm and dry.  Neurological:     Mental Status: She is alert and oriented to person, place, and time.  Psychiatric:        Behavior: Behavior normal.   There were no vitals taken for this visit. Wt Readings from Last 3 Encounters:  05/16/21 85 lb (38.6 kg)  04/20/21 82 lb 6.4 oz (37.4 kg)  04/14/21 83 lb 4 oz (37.8 kg)    Diabetic Foot Exam - Simple   No data filed    Lab Results  Component Value Date   WBC 7.2 03/22/2021   HGB 10.3 (L) 03/22/2021   HCT 32.4 (L) 03/22/2021   PLT 244 03/22/2021   GLUCOSE 136 (H) 04/11/2021   CHOL 117 10/24/2020   TRIG 128.0 10/24/2020   HDL 34.40 (L) 10/24/2020   LDLDIRECT 72.1 01/19/2009   LDLCALC 57 10/24/2020   ALT 9 11/28/2020   AST 15  11/28/2020   NA 139 04/11/2021   K 4.8 04/11/2021   CL 102 04/11/2021   CREATININE 1.22 (H) 04/11/2021   BUN 30 04/11/2021   CO2 25 04/11/2021   TSH 2.72 10/24/2020   INR 2.1 04/25/2018   HGBA1C 5.5 10/24/2020    Lab Results  Component Value Date   TSH 2.72 10/24/2020   Lab Results  Component Value Date   WBC 7.2 03/22/2021   HGB 10.3 (L) 03/22/2021   HCT 32.4 (L) 03/22/2021   MCV 91.8 03/22/2021   PLT 244 03/22/2021   Lab Results  Component Value Date   NA 139 04/11/2021   K 4.8 04/11/2021   CO2 25 04/11/2021   GLUCOSE 136 (H) 04/11/2021   BUN 30 04/11/2021   CREATININE 1.22 (H) 04/11/2021   BILITOT 0.4 11/28/2020   ALKPHOS 15 (L) 11/28/2020   AST 15 11/28/2020   ALT 9 11/28/2020   PROT 6.7 11/28/2020   ALBUMIN 3.2 (L) 11/28/2020   CALCIUM 8.4 (L) 04/11/2021   ANIONGAP 8 03/22/2021   EGFR 41 (L) 04/11/2021   GFR 47.42 (L) 03/24/2021   Lab Results  Component Value Date   CHOL 117 10/24/2020   Lab Results  Component Value Date   HDL 34.40 (L) 10/24/2020   Lab Results  Component Value Date   LDLCALC 57 10/24/2020   Lab Results  Component Value Date   TRIG 128.0 10/24/2020   Lab Results  Component Value Date   CHOLHDL 3 10/24/2020   Lab Results  Component Value Date   HGBA1C 5.5 10/24/2020       Assessment & Plan:   Problem List Items Addressed This Visit  Essential hypertension (Chronic)    Well controlled, no changes to meds. Encouraged heart healthy diet such as the DASH diet and exercise as tolerated.       Hypothyroidism    On Levothyroxine, continue to monitor      Vitamin D deficiency    Supplement and monitor      Hyperlipidemia, mixed    Encourage heart healthy diet such as MIND or DASH diet, increase exercise, avoid trans fats, simple carbohydrates and processed foods, consider a krill or fish or flaxseed oil cap daily.       Anxiety state    Stable on current meds, uses Alprazolam infrequently      Renal  insufficiency    Hydrate and monitor      Vitamin B12 deficiency    Supplement and monitor      Muscle cramp    Hydrate and monitor       I am having [name] [name] maintain her Vitamin D, famotidine, Hemocyte-F, furosemide, Eliquis, losartan, isosorbide mononitrate, metoprolol succinate, pantoprazole, hydrALAZINE, and ALPRAZolam.  No orders of the defined types were placed in this encounter.    Penni Homans, MD

## 2021-09-17 NOTE — Assessment & Plan Note (Signed)
Hydrate and monitor 

## 2021-09-17 NOTE — Assessment & Plan Note (Signed)
Supplement and monitor 

## 2021-09-17 NOTE — Assessment & Plan Note (Signed)
Well controlled, no changes to meds. Encouraged heart healthy diet such as the DASH diet and exercise as tolerated.  °

## 2021-09-17 NOTE — Assessment & Plan Note (Signed)
Stable on current meds, uses Alprazolam infrequently

## 2021-09-17 NOTE — Assessment & Plan Note (Signed)
On Levothyroxine, continue to monitor 

## 2021-09-17 NOTE — Assessment & Plan Note (Signed)
Encourage heart healthy diet such as MIND or DASH diet, increase exercise, avoid trans fats, simple carbohydrates and processed foods, consider a krill or fish or flaxseed oil cap daily.  °

## 2021-09-18 ENCOUNTER — Encounter (HOSPITAL_COMMUNITY): Payer: Self-pay | Admitting: Emergency Medicine

## 2021-09-18 ENCOUNTER — Telehealth: Payer: Self-pay

## 2021-09-18 ENCOUNTER — Observation Stay (HOSPITAL_COMMUNITY)
Admission: EM | Admit: 2021-09-18 | Discharge: 2021-09-19 | Disposition: A | Discharge status: Home / Self Care | Payer: Medicare Other | Attending: Family Medicine | Admitting: Family Medicine

## 2021-09-18 ENCOUNTER — Ambulatory Visit (INDEPENDENT_AMBULATORY_CARE_PROVIDER_SITE_OTHER): Payer: Medicare Other | Admitting: Family Medicine

## 2021-09-18 ENCOUNTER — Other Ambulatory Visit: Payer: Self-pay

## 2021-09-18 VITALS — BP 118/70 | HR 48 | Temp 98.0°F | Resp 16 | Ht 59.0 in | Wt 86.2 lb

## 2021-09-18 DIAGNOSIS — R7889 Finding of other specified substances, not normally found in blood: Secondary | ICD-10-CM | POA: Diagnosis not present

## 2021-09-18 DIAGNOSIS — I10 Essential (primary) hypertension: Secondary | ICD-10-CM

## 2021-09-18 DIAGNOSIS — I4819 Other persistent atrial fibrillation: Secondary | ICD-10-CM | POA: Diagnosis present

## 2021-09-18 DIAGNOSIS — R001 Bradycardia, unspecified: Secondary | ICD-10-CM | POA: Diagnosis not present

## 2021-09-18 DIAGNOSIS — D649 Anemia, unspecified: Secondary | ICD-10-CM | POA: Diagnosis present

## 2021-09-18 DIAGNOSIS — E039 Hypothyroidism, unspecified: Secondary | ICD-10-CM | POA: Insufficient documentation

## 2021-09-18 DIAGNOSIS — R252 Cramp and spasm: Secondary | ICD-10-CM | POA: Diagnosis not present

## 2021-09-18 DIAGNOSIS — E782 Mixed hyperlipidemia: Secondary | ICD-10-CM | POA: Diagnosis not present

## 2021-09-18 DIAGNOSIS — N1831 Chronic kidney disease, stage 3a: Secondary | ICD-10-CM | POA: Diagnosis not present

## 2021-09-18 DIAGNOSIS — I480 Paroxysmal atrial fibrillation: Secondary | ICD-10-CM | POA: Insufficient documentation

## 2021-09-18 DIAGNOSIS — Z23 Encounter for immunization: Secondary | ICD-10-CM

## 2021-09-18 DIAGNOSIS — R609 Edema, unspecified: Secondary | ICD-10-CM | POA: Diagnosis not present

## 2021-09-18 DIAGNOSIS — D509 Iron deficiency anemia, unspecified: Principal | ICD-10-CM | POA: Insufficient documentation

## 2021-09-18 DIAGNOSIS — N289 Disorder of kidney and ureter, unspecified: Secondary | ICD-10-CM | POA: Diagnosis not present

## 2021-09-18 DIAGNOSIS — Z7901 Long term (current) use of anticoagulants: Secondary | ICD-10-CM | POA: Insufficient documentation

## 2021-09-18 DIAGNOSIS — K921 Melena: Secondary | ICD-10-CM | POA: Diagnosis not present

## 2021-09-18 DIAGNOSIS — D6859 Other primary thrombophilia: Secondary | ICD-10-CM | POA: Diagnosis not present

## 2021-09-18 DIAGNOSIS — I129 Hypertensive chronic kidney disease with stage 1 through stage 4 chronic kidney disease, or unspecified chronic kidney disease: Secondary | ICD-10-CM | POA: Insufficient documentation

## 2021-09-18 DIAGNOSIS — E538 Deficiency of other specified B group vitamins: Secondary | ICD-10-CM

## 2021-09-18 DIAGNOSIS — Z79899 Other long term (current) drug therapy: Secondary | ICD-10-CM | POA: Insufficient documentation

## 2021-09-18 DIAGNOSIS — E559 Vitamin D deficiency, unspecified: Secondary | ICD-10-CM | POA: Diagnosis not present

## 2021-09-18 DIAGNOSIS — R195 Other fecal abnormalities: Secondary | ICD-10-CM

## 2021-09-18 DIAGNOSIS — F411 Generalized anxiety disorder: Secondary | ICD-10-CM

## 2021-09-18 LAB — CBC WITH DIFFERENTIAL/PLATELET
Abs Immature Granulocytes: 0.04 10*3/uL (ref 0.00–0.07)
Basophils Absolute: 0 10*3/uL (ref 0.0–0.1)
Basophils Relative: 0 %
Eosinophils Absolute: 0 10*3/uL (ref 0.0–0.5)
Eosinophils Relative: 0 %
HCT: 17.5 % — ABNORMAL LOW (ref 36.0–46.0)
Hemoglobin: 5 g/dL — CL (ref 12.0–15.0)
Immature Granulocytes: 1 %
Lymphocytes Relative: 19 %
Lymphs Abs: 1.6 10*3/uL (ref 0.7–4.0)
MCH: 21.4 pg — ABNORMAL LOW (ref 26.0–34.0)
MCHC: 28.6 g/dL — ABNORMAL LOW (ref 30.0–36.0)
MCV: 74.8 fL — ABNORMAL LOW (ref 80.0–100.0)
Monocytes Absolute: 0.5 10*3/uL (ref 0.1–1.0)
Monocytes Relative: 6 %
Neutro Abs: 6 10*3/uL (ref 1.7–7.7)
Neutrophils Relative %: 74 %
Platelets: 244 10*3/uL (ref 150–400)
RBC: 2.34 MIL/uL — ABNORMAL LOW (ref 3.87–5.11)
RDW: 18.6 % — ABNORMAL HIGH (ref 11.5–15.5)
WBC: 8.2 10*3/uL (ref 4.0–10.5)
nRBC: 0 % (ref 0.0–0.2)

## 2021-09-18 LAB — LIPID PANEL
Cholesterol: 112 mg/dL (ref 0–200)
HDL: 41.4 mg/dL (ref >39.00)
LDL Cholesterol: 55 mg/dL (ref 0–99)
NonHDL: 70.7
Total CHOL/HDL Ratio: 3
Triglycerides: 77 mg/dL (ref 0.0–149.0)
VLDL: 15.4 mg/dL (ref 0.0–40.0)

## 2021-09-18 LAB — COMPREHENSIVE METABOLIC PANEL
ALT: 7 U/L (ref 0–35)
AST: 12 U/L (ref 0–37)
Albumin: 3.5 g/dL (ref 3.5–5.2)
Alkaline Phosphatase: 15 U/L — ABNORMAL LOW (ref 39–117)
BUN: 36 mg/dL — ABNORMAL HIGH (ref 6–23)
CO2: 24 mEq/L (ref 19–32)
Calcium: 8.5 mg/dL (ref 8.4–10.5)
Chloride: 104 mEq/L (ref 96–112)
Creatinine, Ser: 1.26 mg/dL — ABNORMAL HIGH (ref 0.40–1.20)
GFR: 36.24 mL/min — ABNORMAL LOW (ref >60.00)
Glucose, Bld: 88 mg/dL (ref 70–99)
Potassium: 4.6 mEq/L (ref 3.5–5.1)
Sodium: 138 mEq/L (ref 135–145)
Total Bilirubin: 0.3 mg/dL (ref 0.2–1.2)
Total Protein: 7.4 g/dL (ref 6.0–8.3)

## 2021-09-18 LAB — CBC
HCT: 17.2 % — CL (ref 36.0–46.0)
Hemoglobin: 5.2 g/dL — CL (ref 12.0–15.0)
MCHC: 30.3 g/dL (ref 30.0–36.0)
MCV: 68.8 fl — ABNORMAL LOW (ref 78.0–100.0)
Platelets: 257 10*3/uL (ref 150.0–400.0)
RBC: 2.5 Mil/uL — ABNORMAL LOW (ref 3.87–5.11)
RDW: 18.8 % — ABNORMAL HIGH (ref 11.5–15.5)
WBC: 8.5 10*3/uL (ref 4.0–10.5)

## 2021-09-18 LAB — BASIC METABOLIC PANEL
Anion gap: 10 (ref 5–15)
BUN: 39 mg/dL — ABNORMAL HIGH (ref 8–23)
CO2: 22 mmol/L (ref 22–32)
Calcium: 8.5 mg/dL — ABNORMAL LOW (ref 8.9–10.3)
Chloride: 107 mmol/L (ref 98–111)
Creatinine, Ser: 1.55 mg/dL — ABNORMAL HIGH (ref 0.44–1.00)
GFR, Estimated: 31 mL/min — ABNORMAL LOW (ref >60)
Glucose, Bld: 124 mg/dL — ABNORMAL HIGH (ref 70–99)
Potassium: 4.5 mmol/L (ref 3.5–5.1)
Sodium: 139 mmol/L (ref 135–145)

## 2021-09-18 LAB — ABO/RH: ABO/RH(D): O POS

## 2021-09-18 LAB — VITAMIN B12: Vitamin B-12: 223 pg/mL (ref 211–911)

## 2021-09-18 LAB — TSH: TSH: 4.34 u[IU]/mL (ref 0.35–5.50)

## 2021-09-18 LAB — VITAMIN D 25 HYDROXY (VIT D DEFICIENCY, FRACTURES): VITD: 26.05 ng/mL — ABNORMAL LOW (ref 30.00–100.00)

## 2021-09-18 LAB — PREPARE RBC (CROSSMATCH)

## 2021-09-18 MED ORDER — SODIUM CHLORIDE 0.9 % IV BOLUS: 500.0000 mL | Freq: Once | INTRAVENOUS | Status: DC

## 2021-09-18 MED ORDER — SODIUM CHLORIDE 0.9 % IV SOLN
10.0000 mL/h | Freq: Once | INTRAVENOUS | Status: AC
  Administered 2021-09-19: 10 mL/h via INTRAVENOUS

## 2021-09-18 NOTE — ED Provider Notes (Cosign Needed Addendum)
[facility]PART[facility] Provider Note   CSN: [id number] Arrival date & time: 09/18/21  1949     History  Chief Complaint  Patient presents with   Abnormal Lab    [name] [name] is a 88 y.o. female is a 88 year old female who presents on referral from her PCP after discovery of new onset severe anemia with hemoglobin of 5 in the outpatient setting on routine laboratory studies.  Patient denies any melena, hematochezia, hematuria but does state for the last 2 weeks she is progressively feeling more short of breath with exertion and fatigue.  No nausea vomiting lightheadedness or syncope.  I have personally reviewed her medical records which is history of hypertension, atrial fibrillation anticoagulated on Eliquis.  Mitral regurgitation, GERD, renal insufficiency, diverticulosis.  Previously on Lasix, however no longer on Lasix at this time.  No new medications. Patient history of hiatal hernia, esophageal diverticulum and sigmoid diverticulosis follows with Dr. Loletha [facility].  No history of GI bleed requiring transfusion in the past.  HPI     Home Medications Prior to Admission medications   Medication Sig Start Date End Date Taking? Authorizing Provider  ALPRAZolam (XANAX) 0.25 MG tablet TAKE 0.5-1 TABLETS (0.125-0.25 MG TOTAL) BY MOUTH 2 (TWO) TIMES DAILY AS NEEDED FOR ANXIETY. 09/11/21  Yes Mosie Lukes, MD  apixaban (ELIQUIS) 2.5 MG TABS tablet TAKE 1 TABLET BY MOUTH TWICE A DAY 04/28/21  Yes [name], MD  Cholecalciferol (VITAMIN D) 2000 UNITS CAPS Take 2,000 Units by mouth every other day. 02/20/12  Yes Mosie Lukes, MD  famotidine (PEPCID) 20 MG tablet Take 20 mg by mouth at bedtime.   Yes [provider]  isosorbide mononitrate (IMDUR) 30 MG 24 hr tablet Take 1 tablet (30 mg total) by mouth daily. 08/10/21  Yes [name], MD  levocetirizine (XYZAL) 5 MG tablet Take 2.5 mg by mouth daily at 12 noon.  08/03/21  Yes [provider]  losartan (COZAAR) 25 MG tablet Take 1 tablet (25 mg total) by mouth daily. 05/16/21  Yes Debbrah Alar, NP  metoprolol succinate (TOPROL-XL) 25 MG 24 hr tablet TAKE 1 TABLET (25 MG TOTAL) BY MOUTH DAILY. 09/05/21  Yes Mosie Lukes, MD  pantoprazole (PROTONIX) 40 MG tablet Take 1 tablet (40 mg total) by mouth daily. 09/06/21  Yes Mosie Lukes, MD  Ferrous Fumarate-Folic Acid (HEMOCYTE-F) 324-1 MG TABS Take 1 tablet by mouth daily. Patient not taking: Reported on 09/19/2021 11/29/20   Mosie Lukes, MD  furosemide (LASIX) 20 MG tablet Take 1 tablet (20 mg total) by mouth as needed for fluid or edema (swelling or weight gain of 2lbs overnight or 5lbs in a week). Patient not taking: Reported on 09/19/2021 04/04/21 07/03/21  Marylu Lund., NP  hydrALAZINE (APRESOLINE) 10 MG tablet Take 1 tablet (10 mg total) by mouth 3 (three) times daily. Patient not taking: Reported on 09/19/2021 09/06/21   Mosie Lukes, MD      Allergies    Amlodipine, Darifenacin hydrobromide, Other, Sulfonamide derivatives, and Tramadol    Review of Systems   Review of Systems  Constitutional:  Positive for fatigue.  Respiratory:         DOE  Gastrointestinal:  Negative for blood in stool.  Genitourinary:  Negative for hematuria.    Physical Exam Updated Vital Signs BP (!) 178/63   Pulse 77   Temp 98.2 F (36.8 C) (Oral)   Resp (!) 23  SpO2 100%  Physical Exam Vitals and nursing note reviewed. Exam conducted with a chaperone present (ED tech).  Constitutional:      Appearance: She is cachectic. She is not ill-appearing or toxic-appearing.  HENT:     Head: Normocephalic and atraumatic.     Mouth/Throat:     Mouth: Mucous membranes are moist.     Pharynx: No oropharyngeal exudate or posterior oropharyngeal erythema.  Eyes:     General:        Right eye: No discharge.        Left eye: No discharge.     Conjunctiva/sclera: Conjunctivae normal.     Comments:  Conjunctival pallor  Neck:     Trachea: Trachea and phonation normal.  Cardiovascular:     Rate and Rhythm: Normal rate and regular rhythm.     Pulses: Normal pulses.     Heart sounds: Normal heart sounds. No murmur heard. Pulmonary:     Effort: Pulmonary effort is normal. No tachypnea, bradypnea, accessory muscle usage, prolonged expiration or respiratory distress.     Breath sounds: Normal breath sounds. No wheezing or rales.  Chest:     Chest wall: No mass, lacerations, deformity, swelling, tenderness, crepitus or edema.  Abdominal:     General: Bowel sounds are normal. There is no distension.     Palpations: Abdomen is soft.     Tenderness: There is no abdominal tenderness.  Genitourinary:    Rectum: Guaiac result positive.  Musculoskeletal:        General: No deformity.     Cervical back: Normal range of motion and neck supple.     Right lower leg: No edema.     Left lower leg: No edema.  Lymphadenopathy:     Cervical: No cervical adenopathy.  Skin:    General: Skin is warm and dry.  Neurological:     Mental Status: She is alert. Mental status is at baseline.     GCS: GCS eye subscore is 4. GCS verbal subscore is 5. GCS motor subscore is 6.     Gait: Gait is intact.  Psychiatric:        Mood and Affect: Mood normal.     ED Results / Procedures / Treatments   Labs (all labs ordered are listed, but only abnormal results are displayed) Labs Reviewed  CBC WITH DIFFERENTIAL/PLATELET - Abnormal; Notable for the following components:      Result Value   RBC 2.34 (*)    Hemoglobin 5.0 (*)    HCT 17.5 (*)    MCV 74.8 (*)    MCH 21.4 (*)    MCHC 28.6 (*)    RDW 18.6 (*)    All other components within normal limits  BASIC METABOLIC PANEL - Abnormal; Notable for the following components:   Glucose, Bld 124 (*)    BUN 39 (*)    Creatinine, Ser 1.55 (*)    Calcium 8.5 (*)    GFR, Estimated 31 (*)    All other components within normal limits  POC OCCULT BLOOD, ED -  Abnormal; Notable for the following components:   Fecal Occult Bld POSITIVE (*)    All other components within normal limits  URINALYSIS, ROUTINE W REFLEX MICROSCOPIC  PROTIME-INR  HEPATIC FUNCTION PANEL  IRON AND TIBC  TYPE AND SCREEN  ABO/RH  PREPARE RBC (CROSSMATCH)    EKG None  Radiology No results found.  Procedures .Critical Care  Performed by: Emeline Darling, PA-C Authorized by: Silverio Decamp  R, PA-C   Critical care provider statement:    Critical care time (minutes):  45   Critical care was time spent personally by me on the following activities:  Development of treatment plan with patient or surrogate, discussions with consultants, evaluation of patient's response to treatment, examination of patient, obtaining history from patient or surrogate, ordering and performing treatments and interventions, ordering and review of laboratory studies, ordering and review of radiographic studies, pulse oximetry and re-evaluation of patient's condition Ultrasound ED Peripheral IV (Provider)  Date/Time: 09/19/2021 1:48 AM  Performed by: Emeline Darling, PA-C Authorized by: Emeline Darling, PA-C   Procedure details:    Indications: poor IV access     Skin Prep: chlorhexidine gluconate     Location: right upper arm.   Angiocath:  20 G   Bedside Ultrasound Guided: Yes     Images: not archived     Patient tolerated procedure without complications: Yes     Dressing applied: Yes   Comments:     Unfortunately, vein blew while flushing, small hematoma at the side. Bleeding well-controlled with pressure.     Medications Ordered in ED Medications  0.9 %  sodium chloride infusion (has no administration in time range)    ED Course/ Medical Decision Making/ A&P Clinical Course as of 09/19/21 0149  Tue Sep 19, 2021  0028 Consult to Dr. Bridgett Larsson, hospitalist, who is agreeable to admitting this patient to his service. I appreciate his collaboration in the care of  this patient.  [RS]    Clinical Course User Index [RS] Chimene Salo, Gypsy Balsam, PA-C                           Medical Decision Making 88 year old female presents with concern for anemia identified in the outpatient setting.  Hypertensive on intake vitals otherwise normal.  Cardiopulmonary exam is normal, abdominal exam is benign.  Neurovascular intact in extremities, truncal pallor.  Differential diagnosis includes limited to Acute blood loss anemia, anemia of chronic disease such as renal disease, iron deficiency anemia.   Amount and/or Complexity of Data Reviewed Labs: ordered.    Details: CBC without with critical anemia with hemoglobin of 5, low MCV.  BMP with AKI creatinine 1.5 increased patient's baseline of 1.2.  O+, Hemoccult positive.  ECG/medicine tests:     Details:  EKG with normal sinus rhythm, no STEMI.  Risk Prescription drug management. Decision regarding hospitalization.   Patient will require admission to the hospital for acute severe symptomatic anemia with hemoglobin of 5.  PRBCs ordered in the ED.  [facility] made aware of the patient via secure chat per protocol.  Consult to hospitalist as above.  Marjorie and her daughters  voiced understanding of her medical evaluation and treatment plan. Each of their questions answered to their expressed satisfaction. They are amenable to plan for admission at this time.   This chart was dictated using voice recognition software, Dragon. Despite the best efforts of this provider to proofread and correct errors, errors may still occur which can change documentation meaning. Final Clinical Impression(s) / ED Diagnoses Final diagnoses:  Symptomatic anemia    Rx / DC Orders ED Discharge Orders     None         Emeline Darling, PA-C 09/19/21 0032    Bless Lisenby, Gypsy Balsam, PA-C 09/19/21 0149    Elnora Morrison, MD 09/21/21 1440

## 2021-09-18 NOTE — ED Provider Triage Note (Cosign Needed Addendum)
Emergency Medicine Provider Triage Evaluation Note  [name] [name] , a 88 y.o. female  was evaluated in triage.  Pt complains of abnormal lab.  Was seen by PCP today for normal follow-up.  Noted to have a new hemoglobin of 5.  She denies any melena or bright blood per rectum.  Does state occasionally she notices some darkened urine however denies any gross hematuria.  No epistaxis.  No recent falls or injuries. States she does feel generally weak and fatigued. On eliquis  Review of Systems  Positive: No abnormal lab Negative: fever  Physical Exam  There were no vitals taken for this visit. Gen:   Awake, no distress   Resp:  Normal effort  MSK:   Moves extremities without difficulty  Other:    Medical Decision Making  Medically screening exam initiated at 8:28 PM.  Appropriate orders placed.  [name] [name] was informed that the remainder of the evaluation will be completed by another provider, this initial triage assessment does not replace that evaluation, and the importance of remaining in the ED until their evaluation is complete.  Abnormal lab      [name] [name] A, PA-C 09/18/21 2040

## 2021-09-18 NOTE — Assessment & Plan Note (Signed)
Treat anemia, increase protein intake, compression hose on in am off in pm and elevate feet above heart for 15 minutes tid

## 2021-09-18 NOTE — ED Provider Notes (Incomplete)
[facility] Provider Note   CSN: [id number] Arrival date & time: 09/18/21  1949     History {Add pertinent medical, surgical, social history, OB history to HPI:1} Chief Complaint  Patient presents with  . Abnormal Lab    [name] [name] is a 88 y.o. female is a 88 year old female who presents on referral from her PCP after discovery of new onset severe anemia with hemoglobin of 5 in the outpatient setting on routine laboratory studies.  Patient denies any melena, hematochezia, hematuria but does state for the last 2 weeks she is progressively feeling more short of breath with exertion and fatigue.  No nausea vomiting lightheadedness or syncope.  I have personally reviewed her medical records which is history of hypertension, atrial fibrillation anticoagulated on Eliquis.  Mitral regurgitation, GERD, renal insufficiency, diverticulosis.  Previously on Lasix, however no longer on Lasix at this time.  No new medications.  HPI     Home Medications Prior to Admission medications   Medication Sig Start Date End Date Taking? Authorizing Provider  ALPRAZolam (XANAX) 0.25 MG tablet TAKE 0.5-1 TABLETS (0.125-0.25 MG TOTAL) BY MOUTH 2 (TWO) TIMES DAILY AS NEEDED FOR ANXIETY. 09/11/21   Mosie Lukes, MD  apixaban (ELIQUIS) 2.5 MG TABS tablet TAKE 1 TABLET BY MOUTH TWICE A DAY 04/28/21   [name], MD  Cholecalciferol (VITAMIN D) 2000 UNITS CAPS Take 2,000 Units by mouth every other day. 02/20/12   Mosie Lukes, MD  famotidine (PEPCID) 20 MG tablet Take 40 mg by mouth at bedtime.     [provider]  Ferrous Fumarate-Folic Acid (HEMOCYTE-F) 324-1 MG TABS Take 1 tablet by mouth daily. 11/29/20   Mosie Lukes, MD  furosemide (LASIX) 20 MG tablet Take 1 tablet (20 mg total) by mouth as needed for fluid or edema (swelling or weight gain of 2lbs overnight or 5lbs in a week). 04/04/21 07/03/21  Marylu Lund., NP  hydrALAZINE (APRESOLINE)  10 MG tablet Take 1 tablet (10 mg total) by mouth 3 (three) times daily. 09/06/21   Mosie Lukes, MD  isosorbide mononitrate (IMDUR) 30 MG 24 hr tablet Take 1 tablet (30 mg total) by mouth daily. 08/10/21   [name], MD  losartan (COZAAR) 25 MG tablet Take 1 tablet (25 mg total) by mouth daily. 05/16/21   Debbrah Alar, NP  metoprolol succinate (TOPROL-XL) 25 MG 24 hr tablet TAKE 1 TABLET (25 MG TOTAL) BY MOUTH DAILY. 09/05/21   Mosie Lukes, MD  pantoprazole (PROTONIX) 40 MG tablet Take 1 tablet (40 mg total) by mouth daily. 09/06/21   Mosie Lukes, MD      Allergies    Amlodipine, Darifenacin hydrobromide, Other, Sulfonamide derivatives, and Tramadol    Review of Systems   Review of Systems  Constitutional:  Positive for fatigue.  Respiratory:         DOE  Gastrointestinal:  Negative for blood in stool.  Genitourinary:  Negative for hematuria.    Physical Exam Updated Vital Signs BP (!) 156/54 (BP Location: Left Arm)   Pulse 73   Temp 98.2 F (36.8 C) (Oral)   Resp 18   SpO2 100%  Physical Exam Vitals and nursing note reviewed.  Constitutional:      Appearance: She is cachectic. She is not ill-appearing or toxic-appearing.  HENT:     Head: Normocephalic and atraumatic.     Mouth/Throat:     Mouth: Mucous membranes are moist.  Pharynx: No oropharyngeal exudate or posterior oropharyngeal erythema.  Eyes:     General:        Right eye: No discharge.        Left eye: No discharge.     Conjunctiva/sclera: Conjunctivae normal.     Comments: Conjunctival pallor  Neck:     Trachea: Trachea and phonation normal.  Cardiovascular:     Rate and Rhythm: Normal rate and regular rhythm.     Pulses: Normal pulses.     Heart sounds: Normal heart sounds. No murmur heard. Pulmonary:     Effort: Pulmonary effort is normal. No tachypnea, bradypnea, accessory muscle usage, prolonged expiration or respiratory distress.     Breath sounds: Normal breath sounds. No  wheezing or rales.  Chest:     Chest wall: No mass, lacerations, deformity, swelling, tenderness, crepitus or edema.  Abdominal:     General: Bowel sounds are normal. There is no distension.     Palpations: Abdomen is soft.     Tenderness: There is no abdominal tenderness.  Musculoskeletal:        General: No deformity.     Cervical back: Normal range of motion and neck supple.     Right lower leg: No edema.     Left lower leg: No edema.  Lymphadenopathy:     Cervical: No cervical adenopathy.  Skin:    General: Skin is warm and dry.  Neurological:     Mental Status: She is alert. Mental status is at baseline.     GCS: GCS eye subscore is 4. GCS verbal subscore is 5. GCS motor subscore is 6.     Gait: Gait is intact.  Psychiatric:        Mood and Affect: Mood normal.     ED Results / Procedures / Treatments   Labs (all labs ordered are listed, but only abnormal results are displayed) Labs Reviewed  CBC WITH DIFFERENTIAL/PLATELET - Abnormal; Notable for the following components:      Result Value   RBC 2.34 (*)    Hemoglobin 5.0 (*)    HCT 17.5 (*)    MCV 74.8 (*)    MCH 21.4 (*)    MCHC 28.6 (*)    RDW 18.6 (*)    All other components within normal limits  BASIC METABOLIC PANEL - Abnormal; Notable for the following components:   Glucose, Bld 124 (*)    BUN 39 (*)    Creatinine, Ser 1.55 (*)    Calcium 8.5 (*)    GFR, Estimated 31 (*)    All other components within normal limits  URINALYSIS, ROUTINE W REFLEX MICROSCOPIC  POC OCCULT BLOOD, ED  TYPE AND SCREEN  ABO/RH    EKG None  Radiology No results found.  Procedures .Critical Care  Performed by: Emeline Darling, PA-C Authorized by: Emeline Darling, PA-C   Critical care provider statement:    Critical care time (minutes):  45   Critical care was time spent personally by me on the following activities:  Development of treatment plan with patient or surrogate, discussions with consultants,  evaluation of patient's response to treatment, examination of patient, obtaining history from patient or surrogate, ordering and performing treatments and interventions, ordering and review of laboratory studies, ordering and review of radiographic studies, pulse oximetry and re-evaluation of patient's condition   {Document cardiac monitor, telemetry assessment procedure when appropriate:1}  Medications Ordered in ED Medications - No data to display  ED Course/ Medical Decision  Making/ A&P                           Medical Decision Making 88 year old female presents with concern for anemia identified in the outpatient setting.  Hypertensive on intake vitals otherwise normal.  Cardiopulmonary exam is normal, abdominal exam is benign.  Neurovascular intact in extremities, truncal pallor.  Differential diagnosis includes limited to Acute blood loss anemia, anemia of chronic disease such as renal disease, iron deficiency anemia.    ***  {Document critical care time when appropriate:1} {Document review of labs and clinical decision tools ie heart score, Chads2Vasc2 etc:1}  {Document your independent review of radiology images, and any outside records:1} {Document your discussion with family members, caretakers, and with consultants:1} {Document social determinants of health affecting pt's care:1} {Document your decision making why or why not admission, treatments were needed:1} Final Clinical Impression(s) / ED Diagnoses Final diagnoses:  None    Rx / DC Orders ED Discharge Orders     None

## 2021-09-18 NOTE — ED Triage Notes (Signed)
Pt went to PCP for checkup, they called her and told her she has a hgb of 5.  Only c/o of weakness, dark urine, no blood in stools.  Pt is alert and oriented.

## 2021-09-18 NOTE — Assessment & Plan Note (Signed)
hgb has dropped from 10.3 to 5.2. patient is called and advised to present to the ER for further evaluation and transfusion.

## 2021-09-18 NOTE — Telephone Encounter (Signed)
CRITICAL VALUE STICKER  CRITICAL VALUE: Hemoglobin 5.2, Hematocrit 17.2    RECEIVER (on-site recipient of call): Azir Muzyka, RMA  DATE & TIME NOTIFIED: 09/18/2021 @ 4:31 pm   MESSENGER (representative from lab): Johnnette Gourd  MD NOTIFIED: Penni Homans, MD   TIME OF NOTIFICATION: 4:54 pm   RESPONSE:

## 2021-09-18 NOTE — Patient Instructions (Addendum)
Compression hose, knee high light weight on in am off in pm Feet above heart 15 minutes 2-3 x daily  More protein in diet, want some source of protein roughly every 4 hours with water.  Multivitamin with minerals and consider a fatty acid supplement such as fish or krill or flaxseed oil  RSV (respiratory syncitial virus) vaccine at pharmacy, [place] booster when new version out late September At pharmacy High dose flu shot mid Sept to mid Oct, given today  Consider a Tetanus booster at the pharmacy after you have the other shots or if injured  Edema  Edema is an abnormal buildup of fluids in the body tissues and under the skin. Swelling of the legs, feet, and ankles is a common symptom that becomes more likely as you get older. Swelling is also common in looser tissues, such as around the eyes. Pressing on the area may make a temporary dent in your skin (pitting edema). This fluid may also accumulate in your lungs (pulmonary edema). There are many possible causes of edema. Eating too much salt (sodium) and being on your feet or sitting for a long time can cause edema in your legs, feet, and ankles. Common causes of edema include: Certain medical conditions, such as heart failure, liver or kidney disease, and cancer. Weak leg blood vessels. An injury. Pregnancy. Medicines. Being obese. Low protein levels in the blood. Hot weather may make edema worse. Edema is usually painless. Your skin may look swollen or shiny. Follow these instructions at home: Medicines Take over-the-counter and prescription medicines only as told by your health care provider. Your health care provider may prescribe a medicine to help your body get rid of extra water (diuretic). Take this medicine if you are told to take it. Eating and drinking Eat a low-salt (low-sodium) diet to reduce fluid as told by your health care provider. Sometimes, eating less salt may reduce swelling. Depending on the cause of your  swelling, you may need to limit how much fluid you drink (fluid restriction). General instructions Raise (elevate) the injured area above the level of your heart while you are sitting or lying down. Do not sit still or stand for long periods of time. Do not wear tight clothing. Do not wear garters on your upper legs. Exercise your legs to get your circulation going. This helps to move the fluid back into your blood vessels, and it may help the swelling go down. Wear compression stockings as told by your health care provider. These stockings help to prevent blood clots and reduce swelling in your legs. It is important that these are the correct size. These stockings should be prescribed by your health care provider to prevent possible injuries. If elastic bandages or wraps are recommended, use them as told by your health care provider. Contact a health care provider if: Your edema does not get better with treatment. You have heart, liver, or kidney disease and have symptoms of edema. You have sudden and unexplained weight gain. Get help right away if: You develop shortness of breath or chest pain. You cannot breathe when you lie down. You develop pain, redness, or warmth in the swollen areas. You have heart, liver, or kidney disease and suddenly get edema. You have a fever and your symptoms suddenly get worse. These symptoms may be an emergency. Get help right away. Call 911. Do not wait to see if the symptoms will go away. Do not drive yourself to the hospital. Summary Edema is  an abnormal buildup of fluids in the body tissues and under the skin. Eating too much salt (sodium)and being on your feet or sitting for a long time can cause edema in your legs, feet, and ankles. Raise (elevate) the injured area above the level of your heart while you are sitting or lying down. Follow your health care provider's instructions about diet and how much fluid you can drink. This information is not  intended to replace advice given to you by your health care provider. Make sure you discuss any questions you have with your health care provider. Document Revised: 08/22/2020 Document Reviewed: 08/22/2020 Elsevier Patient Education  [place].

## 2021-09-19 DIAGNOSIS — N1831 Chronic kidney disease, stage 3a: Secondary | ICD-10-CM | POA: Diagnosis not present

## 2021-09-19 DIAGNOSIS — D649 Anemia, unspecified: Secondary | ICD-10-CM | POA: Diagnosis not present

## 2021-09-19 DIAGNOSIS — I4819 Other persistent atrial fibrillation: Secondary | ICD-10-CM

## 2021-09-19 DIAGNOSIS — Z7901 Long term (current) use of anticoagulants: Secondary | ICD-10-CM

## 2021-09-19 DIAGNOSIS — I10 Essential (primary) hypertension: Secondary | ICD-10-CM

## 2021-09-19 DIAGNOSIS — R195 Other fecal abnormalities: Secondary | ICD-10-CM

## 2021-09-19 DIAGNOSIS — E039 Hypothyroidism, unspecified: Secondary | ICD-10-CM | POA: Diagnosis not present

## 2021-09-19 LAB — BASIC METABOLIC PANEL
Anion gap: 11 (ref 5–15)
BUN: 28 mg/dL — ABNORMAL HIGH (ref 8–23)
CO2: 25 mmol/L (ref 22–32)
Calcium: 9 mg/dL (ref 8.9–10.3)
Chloride: 103 mmol/L (ref 98–111)
Creatinine, Ser: 1.21 mg/dL — ABNORMAL HIGH (ref 0.44–1.00)
GFR, Estimated: 41 mL/min — ABNORMAL LOW (ref >60)
Glucose, Bld: 98 mg/dL (ref 70–99)
Potassium: 3.9 mmol/L (ref 3.5–5.1)
Sodium: 139 mmol/L (ref 135–145)

## 2021-09-19 LAB — CBC
HCT: 29.5 % — ABNORMAL LOW (ref 36.0–46.0)
Hemoglobin: 9.5 g/dL — ABNORMAL LOW (ref 12.0–15.0)
MCH: 23.9 pg — ABNORMAL LOW (ref 26.0–34.0)
MCHC: 32.2 g/dL (ref 30.0–36.0)
MCV: 74.3 fL — ABNORMAL LOW (ref 80.0–100.0)
Platelets: 256 10*3/uL (ref 150–400)
RBC: 3.97 MIL/uL (ref 3.87–5.11)
RDW: 17.6 % — ABNORMAL HIGH (ref 11.5–15.5)
WBC: 7.8 10*3/uL (ref 4.0–10.5)
nRBC: 0 % (ref 0.0–0.2)

## 2021-09-19 LAB — IRON AND TIBC
Iron: 55 ug/dL (ref 28–170)
Saturation Ratios: 10 % — ABNORMAL LOW (ref 10.4–31.8)
TIBC: 526 ug/dL — ABNORMAL HIGH (ref 250–450)
UIBC: 471 ug/dL

## 2021-09-19 LAB — POC OCCULT BLOOD, ED: Fecal Occult Bld: POSITIVE — AB

## 2021-09-19 LAB — FERRITIN: Ferritin: 5 ng/mL — ABNORMAL LOW (ref 11–307)

## 2021-09-19 LAB — HEPATIC FUNCTION PANEL
ALT: 11 U/L (ref 0–44)
AST: 16 U/L (ref 15–41)
Albumin: 3.2 g/dL — ABNORMAL LOW (ref 3.5–5.0)
Alkaline Phosphatase: 17 U/L — ABNORMAL LOW (ref 38–126)
Bilirubin, Direct: <0.1 mg/dL (ref 0.0–0.2)
Total Bilirubin: 0.2 mg/dL — ABNORMAL LOW (ref 0.3–1.2)
Total Protein: 7.5 g/dL (ref 6.5–8.1)

## 2021-09-19 LAB — PROTIME-INR
INR: 1.3 — ABNORMAL HIGH (ref 0.8–1.2)
Prothrombin Time: 15.6 seconds — ABNORMAL HIGH (ref 11.4–15.2)

## 2021-09-19 MED ORDER — HEMOCYTE-F 324-1 MG PO TABS: 1.0000 | ORAL_TABLET | Freq: Every day | ORAL | 5 refills | Status: DC

## 2021-09-19 MED ORDER — ACETAMINOPHEN 650 MG RE SUPP: 650.0000 mg | Freq: Four times a day (QID) | RECTAL | Status: DC | PRN

## 2021-09-19 MED ORDER — FAMOTIDINE 20 MG PO TABS: 20.0000 mg | ORAL_TABLET | Freq: Every day | ORAL | Status: DC

## 2021-09-19 MED ORDER — PANTOPRAZOLE SODIUM 40 MG PO TBEC
40.0000 mg | DELAYED_RELEASE_TABLET | Freq: Every day | ORAL | Status: DC
  Administered 2021-09-19: 40 mg via ORAL
  Filled 2021-09-19: qty 1

## 2021-09-19 MED ORDER — HYDRALAZINE HCL 20 MG/ML IJ SOLN
5.0000 mg | Freq: Once | INTRAMUSCULAR | Status: AC
  Administered 2021-09-19: 5 mg via INTRAVENOUS
  Filled 2021-09-19: qty 1

## 2021-09-19 MED ORDER — ONDANSETRON HCL 4 MG/2ML IJ SOLN: 4.0000 mg | Freq: Four times a day (QID) | INTRAMUSCULAR | Status: DC | PRN

## 2021-09-19 MED ORDER — ONDANSETRON HCL 4 MG PO TABS: 4.0000 mg | ORAL_TABLET | Freq: Four times a day (QID) | ORAL | Status: DC | PRN

## 2021-09-19 MED ORDER — ACETAMINOPHEN 325 MG PO TABS: 650.0000 mg | ORAL_TABLET | Freq: Four times a day (QID) | ORAL | Status: DC | PRN

## 2021-09-19 MED ORDER — METOPROLOL SUCCINATE ER 25 MG PO TB24
25.0000 mg | ORAL_TABLET | Freq: Every day | ORAL | Status: DC
  Administered 2021-09-19: 25 mg via ORAL
  Filled 2021-09-19: qty 1

## 2021-09-19 MED ORDER — ALPRAZOLAM 0.25 MG PO TABS: 0.1250 mg | ORAL_TABLET | Freq: Two times a day (BID) | ORAL | Status: DC | PRN

## 2021-09-19 MED ORDER — FUROSEMIDE 20 MG PO TABS
20.0000 mg | ORAL_TABLET | Freq: Every morning | ORAL | Status: DC
  Administered 2021-09-19: 20 mg via ORAL
  Filled 2021-09-19: qty 1

## 2021-09-19 NOTE — Consult Note (Addendum)
Referring Provider: Kristopher Oppenheim, DO Primary Care Physician:  Mosie Lukes, MD Primary Gastroenterologist:  Dr. Wilfrid Lund  Reason for Consultation:  Anemia   IMPRESSION and PLAN:  Symptomatic microcytic anemia with heme + stools. Source unclear without localizing symptoms. She is high risk for endoscopic evaluation due to advanced age, comorbidities, and chronic Eliquis use. She is also resistant to any endoscopic evaluation at this time. Discussed CT scan as an alternative, albeit more limited, option for evaluation. Unfortunately, contrast limited due to chronic kidney disease. Plan serial hg/hct with transfusion as indicated in the meantime. Anemia labs pending. Eliquis is currently on hold.   History of colitis on CT 2022. Etiology unclear. GI pathogen panel and guaiac negative at that time. Unclear if this is contributing to current symptoms.   History of achalasia, presbyesophagus, and large esophageal diverticulum with chronic dysphagia. Symptoms are at baseline. No additional evaluation or treatment recommended at this time.   History of sigmoid diverticulitis 2017    HPI: [name] [name] is a 88 y.o. female referred by Dr. Bridgett Larsson for further evaluation of anemia and suspected GI bleed. The history is obtained through the patient and review of her electronic health record.   Known to Dr. Loletha Carrow (and previously Dr. [name]) with a history of sigmoid diverticulitis in 2017, GERD presenting with atypical chest pain, dysphagia with prior diagnosis of achalasia treated with Botox injection to the LES in 2011 and 2014, and a large distal esophageal diverticulum. Her last EGD was 2020 for dysphagia and weight loss showed a very large diverticulum in the lower third of the esophagus containing food and a tortuous distal esophagus. This was thought to be the cause of her symptoms. She also had a 3 cm hiatal hernia. She continues to have chronic dysphagia.  She also has atrial fibrillation on  oral anticoagulation with Eliquis, hypertension, stage IIIa chronic kidney disease, hypothyroidism.   Labs performed at PCP's office yesterday to evaluate symptomatic anemia showed a hemoglobin of 5.2, MCV 74.8, RDW 18.6, platelets 244.  BUN 39, crt 1.55. Stool is guaiac positive. Hemoglobin has ranged from 9.9-11 over the last two years, most recently 10.3 03/22/21.   She has admitted through the ED and given two units of PRBCs.   She has had progressive fatigue and exertional dyspnea over the last 2 weeks. No headache, irritability, exercise intolerance, vertigo, or angina pectoris.  No pica.  No hearing loss.    May have had some pink stools. Increased gas. Otherwise, no overt GI blood loss. No melena, hematochezia, bright red blood per rectum. No epistaxis, vaginal bleeding, hemoptysis, or hematuria.  Bowel habits are stable and at baseline with 1-2 formed stools daily.   Other than chronic dysphagia, GI ROS is otherwise negative.   No NSAIDs.   Last abdominal imaging including CT abd/pelvis with contrast 08/17/20 during ED evaluation for dark stool: mild diffuse colitis, colonic diverticulosis, large esophageal diverticulum  Last colonoscopy in 2008 with Dr. [name] showed left-sided diverticulosis. Colonoscopy in 2003 for hematochezia showed diverticulosis and a small polyp.    Past Medical History:  Diagnosis Date   Anemia    Anxiety    Arthritis    hips, knees   Current use of long term anticoagulation    Diverticulitis of colon 02/06/2015   Gait difficulty    GERD (gastroesophageal reflux disease)    Headache(784.0) 06/22/2012   Hyperlipidemia    Hypertension    IBS (irritable bowel syndrome)    Incontinence  Loss of weight 06/05/2015   Medicare annual wellness visit, subsequent 02/05/2014   Melena    Mild mitral regurgitation    Mild tricuspid regurgitation    Mitral and aortic regurgitation    Mitral valve prolapse    hx of - not seen on recent echoes   Osteopenia     Paroxysmal atrial flutter ([place])    Pedal edema 10/01/2016   Persistent atrial fibrillation (HCC)    Personal history of colonic polyps    Renal lithiasis 06/26/2015   Sinus bradycardia    Thyroid disease    hypo   Vitamin B12 deficiency 03/16/2016   Vitamin D deficiency     Past Surgical History:  Procedure Laterality Date   BOTOX INJECTION N/A 09/03/2012   Procedure: BOTOX INJECTION;  Surgeon: Inda Castle, MD;  Location: [facility];  Service: Endoscopy;  Laterality: N/A;   BREAST BIOPSY Left    BREAST LUMPECTOMY Right 08/18/2014   Procedure: RIGHT BREAST LUMPECTOMY;  Surgeon: Coralie Keens, MD;  Location: [facility];  Service: General;  Laterality: Right;   CARDIAC ELECTROPHYSIOLOGY MAPPING AND ABLATION     CATARACT EXTRACTION, BILATERAL     ESOPHAGOGASTRODUODENOSCOPY N/A 09/03/2012   Procedure: ESOPHAGOGASTRODUODENOSCOPY (EGD);  Surgeon: Inda Castle, MD;  Location: [facility];  Service: Endoscopy;  Laterality: N/A;   ESOPHAGOGASTRODUODENOSCOPY (EGD) WITH PROPOFOL N/A 12/02/2018   Procedure: ESOPHAGOGASTRODUODENOSCOPY (EGD) WITH PROPOFOL;  Surgeon: [name], MD;  Location: [facility];  Service: Gastroenterology;  Laterality: N/A;   ESOPHAGOSCOPY W/ BOTOX INJECTION     I & D EXTREMITY Right 11/06/2012   Procedure: IRRIGATION AND DEBRIDEMENT EXTREMITY Right Ring Finger;  Surgeon: Tennis Must, MD;  Location: [facility];  Service: Orthopedics;  Laterality: Right;   PARTIAL HYSTERECTOMY     ovaries left in place   skin cancer removal      Prior to Admission medications   Medication Sig Start Date End Date Taking? Authorizing Provider  ALPRAZolam (XANAX) 0.25 MG tablet TAKE 0.5-1 TABLETS (0.125-0.25 MG TOTAL) BY MOUTH 2 (TWO) TIMES DAILY AS NEEDED FOR ANXIETY. 09/11/21  Yes Mosie Lukes, MD  apixaban (ELIQUIS) 2.5 MG TABS tablet TAKE 1 TABLET BY MOUTH TWICE A DAY 04/28/21  Yes [name], MD  Cholecalciferol (VITAMIN D) 2000 UNITS CAPS Take  2,000 Units by mouth every other day. 02/20/12  Yes Mosie Lukes, MD  famotidine (PEPCID) 20 MG tablet Take 20 mg by mouth at bedtime.   Yes [provider]  isosorbide mononitrate (IMDUR) 30 MG 24 hr tablet Take 1 tablet (30 mg total) by mouth daily. 08/10/21  Yes [name], MD  levocetirizine (XYZAL) 5 MG tablet Take 2.5 mg by mouth daily at 12 noon. 08/03/21  Yes [provider]  losartan (COZAAR) 25 MG tablet Take 1 tablet (25 mg total) by mouth daily. 05/16/21  Yes Debbrah Alar, NP  metoprolol succinate (TOPROL-XL) 25 MG 24 hr tablet TAKE 1 TABLET (25 MG TOTAL) BY MOUTH DAILY. 09/05/21  Yes Mosie Lukes, MD  pantoprazole (PROTONIX) 40 MG tablet Take 1 tablet (40 mg total) by mouth daily. 09/06/21  Yes Mosie Lukes, MD  Ferrous Fumarate-Folic Acid (HEMOCYTE-F) 324-1 MG TABS Take 1 tablet by mouth daily. Patient not taking: Reported on 09/19/2021 11/29/20   Mosie Lukes, MD  furosemide (LASIX) 20 MG tablet Take 1 tablet (20 mg total) by mouth as needed for fluid or edema (swelling or weight gain of 2lbs overnight or  5lbs in a week). Patient not taking: Reported on 09/19/2021 04/04/21 07/03/21  Marylu Lund., NP  hydrALAZINE (APRESOLINE) 10 MG tablet Take 1 tablet (10 mg total) by mouth 3 (three) times daily. Patient not taking: Reported on 09/19/2021 09/06/21   Mosie Lukes, MD    Current Facility-Administered Medications  Medication Dose Route Frequency Provider Last Rate Last Admin   acetaminophen (TYLENOL) tablet 650 mg  650 mg Oral Q6H PRN Kristopher Oppenheim, DO       Or   acetaminophen (TYLENOL) suppository 650 mg  650 mg Rectal Q6H PRN Kristopher Oppenheim, DO       ALPRAZolam Duanne Moron) tablet 0.125-0.25 mg  0.125-0.25 mg Oral BID PRN Kristopher Oppenheim, DO       famotidine (PEPCID) tablet 20 mg  20 mg Oral QHS Kristopher Oppenheim, DO       furosemide (LASIX) tablet 20 mg  20 mg Oral q AM Kristopher Oppenheim, DO   20 mg at 09/19/21 0840   metoprolol succinate (TOPROL-XL) 24 hr tablet 25 mg   25 mg Oral Daily Kristopher Oppenheim, DO   25 mg at 09/19/21 0840   ondansetron (ZOFRAN) tablet 4 mg  4 mg Oral Q6H PRN Kristopher Oppenheim, DO       Or   ondansetron [facility]) injection 4 mg  4 mg Intravenous Q6H PRN Kristopher Oppenheim, DO       pantoprazole (PROTONIX) EC tablet 40 mg  40 mg Oral Daily Kristopher Oppenheim, DO   40 mg at 09/19/21 1610    Allergies as of 09/18/2021 - Review Complete 09/18/2021  Allergen Reaction Noted   Amlodipine Shortness Of Breath 08/30/2017   Darifenacin hydrobromide Other (See Comments)    Other Other (See Comments) 01/29/2007   Sulfonamide derivatives Other (See Comments) 01/29/2007   Tramadol Other (See Comments) 11/13/2010    Family History  Problem Relation Age of Onset   Diabetes Mother    CVA Mother    Stroke Mother    COPD Father    Rheum arthritis Father    Allergies Daughter    COPD Daughter    Stroke Daughter    COPD Daughter        previous smoker   Stroke Maternal Grandfather    Heart disease Maternal Aunt    Heart disease Maternal Uncle    Colon cancer Neg Hx    Colon polyps Neg Hx    Esophageal cancer Neg Hx    Gallbladder disease Neg Hx    Kidney disease Neg Hx    Heart attack Neg Hx    Hypertension Neg Hx     Social History   Socioeconomic History   Marital status: Widowed    Spouse name: Not on file   Number of children: 5   Years of education: Not on file   Highest education level: Not on file  Occupational History   Occupation: retired    Fish farm manager: RETIRED  Tobacco Use   Smoking status: Never   Smokeless tobacco: Never  Vaping Use   Vaping Use: Never used  Substance and Sexual Activity   Alcohol use: No   Drug use: No   Sexual activity: Not on file    Comment: lives with Daughter, grandson and his family. no dietary restricitons.  Other Topics Concern   Not on file  Social History Narrative   Not on file   Social Determinants of Health   Financial Resource Strain: Not on file  Food Insecurity: Not on file  Transportation  Needs: Not on file  Physical Activity: Not on file  Stress: Not on file  Social Connections: Not on file  Intimate Partner Violence: Not on file    Review of Systems: 12 system ROS is negative except as noted above.   Physical Exam: General:   Alert,  well-nourished, pleasant and cooperative in NAD Head:  Normocephalic and atraumatic. Eyes:  Sclera clear, no icterus.   Conjunctiva pink. Ears:  Normal auditory acuity. Nose:  No deformity, discharge,  or lesions. Mouth:  No deformity or lesions.   Neck:  Supple; no masses or thyromegaly. Lungs:  Clear throughout to auscultation.   No wheezes. Heart:  Regular rate and rhythm; no murmurs. Abdomen:  Soft, nontender, nondistended, normal bowel sounds, no rebound or guarding. No hepatosplenomegaly.   Rectal:  Deferred  Msk:  Symmetrical. No boney deformities LAD: No inguinal or umbilical LAD Extremities:  No clubbing or edema. Neurologic:  Alert and  oriented x4;  grossly nonfocal Skin:  Intact without significant lesions or rashes. Psych:  Alert and cooperative. Normal mood and affect.   Lab Results: Recent Labs    09/18/21 1159 09/18/21 2047  WBC 8.5 8.2  HGB 5.2 Repeated and verified X2.* 5.0*  HCT 17.2 Repeated and verified X2.* 17.5*  PLT 257.0 244   BMET Recent Labs    09/18/21 1159 09/18/21 2047  NA 138 139  K 4.6 4.5  CL 104 107  CO2 24 22  GLUCOSE 88 124*  BUN 36* 39*  CREATININE 1.26* 1.55*  CALCIUM 8.5 8.5*   LFT Recent Labs    09/19/21 0153  PROT 7.5  ALBUMIN 3.2*  AST 16  ALT 11  ALKPHOS 17*  BILITOT 0.2*  BILIDIR <0.1  IBILI NOT CALCULATED   PT/INR Recent Labs    09/19/21 0153  LABPROT 15.6*  INR 1.3*     Stoney Karczewski L. Tarri Glenn, MD, MPH 09/19/2021, 9:04 AM

## 2021-09-19 NOTE — Assessment & Plan Note (Signed)
Stable. Hold BP meds until after PRBC transfusion completed.

## 2021-09-19 NOTE — Subjective & Objective (Signed)
CC: anemia HPI: 88 year old female history of A-fib/flutter, on Toprol-XL and Eliquis, history of hypertension, CKD stage IIIa baseline creatinine approximate 1.2-1.3 presents the ER for anemia.  She had labs drawn yesterday in the office.  Her hemoglobin did drop from 10.3 in March 2023 down to 5.2 yesterday.  Patient denies any bright red blood per rectum or melena.  She thinks she may have had some "pink stuff" in the toilet bowl last week but she did not pay close attention to it.  She lives with one of her daughters.  She walks with a walker.  She denies any cuts or bleeding.  She does admit to feeling extremely fatigued over the last 2 weeks with dyspnea on exertion.  This is unusual for her.  She denies any chest pain.  Repeat CBC today showed a hemoglobin of 5.0, white count 8.2, MCV of 74.8, platelets of 244  Sodium 139, BUN of 39, creatinine 1.55  EKG shows normal sinus rhythm.  EDP is discussed the case with GI who will see her tomorrow.  2 units of packed red blood cells have been ordered for transfusion.  [facility] hospitalist contacted for admission.

## 2021-09-19 NOTE — Assessment & Plan Note (Addendum)
Baseline Scr 1.2-1.3. Scr slightly higher than baseline. Hopefully PRBC transfusion will help with renal perfusion.

## 2021-09-19 NOTE — Assessment & Plan Note (Signed)
On eliquis for hx of afib/flutter.

## 2021-09-19 NOTE — Telephone Encounter (Signed)
Patient currently inpatient at [facility].

## 2021-09-19 NOTE — Hospital Course (Signed)
Mrs. Coggin is a 88 y.o. F with hx Afib on Eliuquis, hypothyroidism, achalasia, HTN, HLD, and CKD iiia baseline 1.2-1.3 who presented with abnormal lab Hgb.  Had labs drawn day before admission in the office at routine appt, Hgb was unexpectedly down to 5.2 from prior baseline 10 g/dL.  Microcytic.  No reported blood loss.

## 2021-09-19 NOTE — Discharge Summary (Signed)
Physician Discharge Summary   Patient: [name] [name] MRN: [id number] DOB: 1924-08-09  Admit date:     09/18/2021  Discharge date: 09/19/21  Discharge Physician: Edwin Dada   PCP: Mosie Lukes, MD     Recommendations at discharge:  Follow up with PCP Dr. [name] in 1 week Dr. [name]: Please repeat iron studies and Hgb at appropriate intervals     Discharge Diagnoses: Principal Problem:   Symptomatic anemia Active Problems:   Heme + stool   Hypothyroidism   Essential hypertension   Persistent atrial fibrillation (HCC)   Stage 3a chronic kidney disease (CKD) ([place])   Acquired thrombophilia due to Afib on chronic anticoagulation      Hospital Course: [name] [name] is a 88 y.o. F with hx Afib on Eliuquis, hypothyroidism, achalasia, HTN, HLD, and CKD iiia baseline 1.2-1.3 who presented with abnormal lab Hgb.  Had labs drawn day before admission in the office at routine appt, Hgb was unexpectedly down to 5.2 from prior baseline 10 g/dL.  Microcytic.  No reported blood loss.     * Symptomatic anemia Heme-Positive stool Iron deficiency anemia Patient was evaluated by gastroenterology.  Patient preferred colonoscopy.  She had no clinical bleeding either prior to admission, or during observation in the hospital.  Iron saturation low, likely iron deficient due to chronic blood loss anemia.  Discharged on oral iron.  Recommend close PCP follow-up for repeat hemoglobin.   Persistent atrial fibrillation Chronic anticoagulation Given absence of clinical bleeding, would recommend continuing Eliquis for now and close follow up as outpatient   Hypothyroidism Normal TSH and FT4             The [place] was reviewed for this patient prior to discharge.  Consultants: Gastroenterology, Dr. Tarri Glenn Procedures performed: None  Disposition: Home   DISCHARGE MEDICATION: Allergies as of 09/19/2021       Reactions    Amlodipine Shortness Of Breath   Darifenacin Hydrobromide Other (See Comments)   dizziness   Other Other (See Comments)   dizziness   Sulfonamide Derivatives Other (See Comments)   dizziness   Tramadol Other (See Comments)   Insomnia, anorexia        Medication List     STOP taking these medications    hydrALAZINE 10 MG tablet Commonly known as: APRESOLINE       TAKE these medications    ALPRAZolam 0.25 MG tablet Commonly known as: XANAX TAKE 0.5-1 TABLETS (0.125-0.25 MG TOTAL) BY MOUTH 2 (TWO) TIMES DAILY AS NEEDED FOR ANXIETY.   Eliquis 2.5 MG Tabs tablet Generic drug: apixaban TAKE 1 TABLET BY MOUTH TWICE A DAY   famotidine 20 MG tablet Commonly known as: PEPCID Take 20 mg by mouth at bedtime.   furosemide 20 MG tablet Commonly known as: LASIX Take 1 tablet (20 mg total) by mouth as needed for fluid or edema (swelling or weight gain of 2lbs overnight or 5lbs in a week).   Hemocyte-F 324-1 MG Tabs Generic drug: Ferrous Fumarate-Folic Acid Take 1 tablet by mouth daily.   isosorbide mononitrate 30 MG 24 hr tablet Commonly known as: IMDUR Take 1 tablet (30 mg total) by mouth daily.   levocetirizine 5 MG tablet Commonly known as: XYZAL Take 2.5 mg by mouth daily at 12 noon.   losartan 25 MG tablet Commonly known as: COZAAR Take 1 tablet (25 mg total) by mouth daily.   metoprolol succinate 25 MG 24 hr tablet Commonly known as: TOPROL-XL TAKE  1 TABLET (25 MG TOTAL) BY MOUTH DAILY.   pantoprazole 40 MG tablet Commonly known as: PROTONIX Take 1 tablet (40 mg total) by mouth daily.   Vitamin D 50 MCG (2000 UT) Caps Take 2,000 Units by mouth every other day.        Follow-up Information     Mosie Lukes, MD. Schedule an appointment as soon as possible for a visit in 1 week(s).   Specialty: Family Medicine Contact information: [address] [place] [address] [phone]                 Discharge Instructions      Discharge instructions   Complete by: As directed    From Dr. Loleta Books: You were admitted for a blood transfusion You were given 2 units of blood transfusion and your Hemoglobin level (the level of red blood cells) improved to near normal.  You should restart iron supplements: Either take the old iron supplement you used to take (ferrous fumarate-folic acid/Hemocyte-F) or just take over the counter ferrous sulfate 325 mg   Take every other day Take with a stool softer, like Colace, in addition to your Benefiber  You may resume your Eliquis  Go see Dr. [name] in 1 week   Increase activity slowly   Complete by: As directed        Discharge Exam: Filed Weights   09/19/21 0259  Weight: 39.1 kg    General: Pt is alert, awake, not in acute distress Cardiovascular: RRR, nl S1-S2, no murmurs appreciated.   No LE edema.   Respiratory: Normal respiratory rate and rhythm.  CTAB without rales or wheezes. Abdominal: Abdomen soft and non-tender.  No distension or HSM.   Neuro/Psych: Strength symmetric in upper and lower extremities.  Judgment and insight appear normal.   Condition at discharge: good  The results of significant diagnostics from this hospitalization (including imaging, microbiology, ancillary and laboratory) are listed below for reference.   Imaging Studies: No results found.  Microbiology: Results for orders placed or performed in visit on 03/24/21  Urine Culture     Status: None   Collection Time: 03/24/21 10:26 AM   Specimen: Urine  Result Value Ref Range Status   MICRO NUMBER: [id number]  Final   SPECIMEN QUALITY: Adequate  Final   Sample Source NOT GIVEN  Final   STATUS: FINAL  Final   Result:   Final    Mixed genital flora isolated. These superficial bacteria are not indicative of a urinary tract infection. No further organism identification is warranted on this specimen. If clinically indicated, recollect clean-catch, mid-stream urine and transfer   immediately to Urine Culture Transport Tube.     Labs: CBC: Recent Labs  Lab 09/18/21 1159 09/18/21 2047 09/19/21 1044  WBC 8.5 8.2 7.8  NEUTROABS  --  6.0  --   HGB 5.2 Repeated and verified X2.* 5.0* 9.5*  HCT 17.2 Repeated and verified X2.* 17.5* 29.5*  MCV 68.8* 74.8* 74.3*  PLT 257.0 244 324   Basic Metabolic Panel: Recent Labs  Lab 09/18/21 1159 09/18/21 2047 09/19/21 1044  NA 138 139 139  K 4.6 4.5 3.9  CL 104 107 103  CO2 '24 22 25  '$ GLUCOSE 88 124* 98  BUN 36* 39* 28*  CREATININE 1.26* 1.55* 1.21*  CALCIUM 8.5 8.5* 9.0   Liver Function Tests: Recent Labs  Lab 09/18/21 1159 09/19/21 0153  AST 12 16  ALT 7 11  ALKPHOS 15* 17*  BILITOT 0.3 0.2*  PROT 7.4 7.5  ALBUMIN 3.5 3.2*   CBG: No results for input(s): "GLUCAP" in the last 168 hours.  Discharge time spent: approximately 35 minutes spent on discharge counseling, evaluation of patient on day of discharge, and coordination of discharge planning with nursing, social work, pharmacy and case management  Signed: Edwin Dada, MD [facility] 09/19/2021

## 2021-09-19 NOTE — Assessment & Plan Note (Signed)
Pt denies any melena or BRBPR. She thinks she may have seen something pink in the toilet last week but nothing recent. GI has been consulted. Will hold Eliquis unit GI has seen patient. Checking TIBC and iron panel.

## 2021-09-19 NOTE — H&P (Signed)
History and Physical    [name] [name] IHK:[id number] DOB: 1924/11/07 DOA: 09/18/2021  DOS: the patient was seen and examined on 09/18/2021  PCP: Mosie Lukes, MD   Patient coming from: Home  I have personally briefly reviewed patient's old medical records in [place]  CC: anemia HPI: 88 year old female history of A-fib/flutter, on Toprol-XL and Eliquis, history of hypertension, CKD stage IIIa baseline creatinine approximate 1.2-1.3 presents the ER for anemia.  She had labs drawn yesterday in the office.  Her hemoglobin did drop from 10.3 in March 2023 down to 5.2 yesterday.  Patient denies any bright red blood per rectum or melena.  She thinks she may have had some "pink stuff" in the toilet bowl last week but she did not pay close attention to it.  She lives with one of her daughters.  She walks with a walker.  She denies any cuts or bleeding.  She does admit to feeling extremely fatigued over the last 2 weeks with dyspnea on exertion.  This is unusual for her.  She denies any chest pain.  Repeat CBC today showed a hemoglobin of 5.0, white count 8.2, MCV of 74.8, platelets of 244  Sodium 139, BUN of 39, creatinine 1.55  EKG shows normal sinus rhythm.  EDP is discussed the case with GI who will see her tomorrow.  2 units of packed red blood cells have been ordered for transfusion.  [facility] hospitalist contacted for admission.    ED Course: HgB 5.0, hemoccult +  Review of Systems:  Review of Systems  Constitutional:  Positive for malaise/fatigue. Negative for fever and weight loss.  HENT: Negative.    Eyes: Negative.   Respiratory:         Dyspnea on exertion   Cardiovascular: Negative.   Gastrointestinal: Negative.  Negative for abdominal pain, blood in stool, constipation, diarrhea, melena and vomiting.  Genitourinary: Negative.   Musculoskeletal: Negative.   Skin: Negative.   Neurological: Negative.   Endo/Heme/Allergies: Negative.    Psychiatric/Behavioral: Negative.    All other systems reviewed and are negative.   Past Medical History:  Diagnosis Date   Anemia    Anxiety    Arthritis    hips, knees   Current use of long term anticoagulation    Diverticulitis of colon 02/06/2015   Gait difficulty    GERD (gastroesophageal reflux disease)    Headache(784.0) 06/22/2012   Hyperlipidemia    Hypertension    IBS (irritable bowel syndrome)    Incontinence    Loss of weight 06/05/2015   Medicare annual wellness visit, subsequent 02/05/2014   Melena    Mild mitral regurgitation    Mild tricuspid regurgitation    Mitral and aortic regurgitation    Mitral valve prolapse    hx of - not seen on recent echoes   Osteopenia    Paroxysmal atrial flutter ([place])    Pedal edema 10/01/2016   Persistent atrial fibrillation (HCC)    Personal history of colonic polyps    Renal lithiasis 06/26/2015   Sinus bradycardia    Thyroid disease    hypo   Vitamin B12 deficiency 03/16/2016   Vitamin D deficiency     Past Surgical History:  Procedure Laterality Date   BOTOX INJECTION N/A 09/03/2012   Procedure: BOTOX INJECTION;  Surgeon: Inda Castle, MD;  Location: [facility];  Service: Endoscopy;  Laterality: N/A;   BREAST BIOPSY Left    BREAST LUMPECTOMY Right 08/18/2014   Procedure: RIGHT BREAST LUMPECTOMY;  Surgeon: Coralie Keens, MD;  Location: [place];  Service: General;  Laterality: Right;   CARDIAC ELECTROPHYSIOLOGY MAPPING AND ABLATION     CATARACT EXTRACTION, BILATERAL     ESOPHAGOGASTRODUODENOSCOPY N/A 09/03/2012   Procedure: ESOPHAGOGASTRODUODENOSCOPY (EGD);  Surgeon: Inda Castle, MD;  Location: [facility];  Service: Endoscopy;  Laterality: N/A;   ESOPHAGOGASTRODUODENOSCOPY (EGD) WITH PROPOFOL N/A 12/02/2018   Procedure: ESOPHAGOGASTRODUODENOSCOPY (EGD) WITH PROPOFOL;  Surgeon: [name], MD;  Location: [facility];  Service: Gastroenterology;  Laterality: N/A;   ESOPHAGOSCOPY W/ BOTOX  INJECTION     I & D EXTREMITY Right 11/06/2012   Procedure: IRRIGATION AND DEBRIDEMENT EXTREMITY Right Ring Finger;  Surgeon: Tennis Must, MD;  Location: [place];  Service: Orthopedics;  Laterality: Right;   PARTIAL HYSTERECTOMY     ovaries left in place   skin cancer removal       reports that she has never smoked. She has never used smokeless tobacco. She reports that she does not drink alcohol and does not use drugs.  Allergies  Allergen Reactions   Amlodipine Shortness Of Breath   Darifenacin Hydrobromide Other (See Comments)    dizziness   Other Other (See Comments)    dizziness   Sulfonamide Derivatives Other (See Comments)    dizziness   Tramadol Other (See Comments)    Insomnia, anorexia    Family History  Problem Relation Age of Onset   Diabetes Mother    CVA Mother    Stroke Mother    COPD Father    Rheum arthritis Father    Allergies Daughter    COPD Daughter    Stroke Daughter    COPD Daughter        previous smoker   Stroke Maternal Grandfather    Heart disease Maternal Aunt    Heart disease Maternal Uncle    Colon cancer Neg Hx    Colon polyps Neg Hx    Esophageal cancer Neg Hx    Gallbladder disease Neg Hx    Kidney disease Neg Hx    Heart attack Neg Hx    Hypertension Neg Hx     Prior to Admission medications   Medication Sig Start Date End Date Taking? Authorizing Provider  ALPRAZolam (XANAX) 0.25 MG tablet TAKE 0.5-1 TABLETS (0.125-0.25 MG TOTAL) BY MOUTH 2 (TWO) TIMES DAILY AS NEEDED FOR ANXIETY. 09/11/21  Yes Mosie Lukes, MD  apixaban (ELIQUIS) 2.5 MG TABS tablet TAKE 1 TABLET BY MOUTH TWICE A DAY 04/28/21  Yes [name], MD  Cholecalciferol (VITAMIN D) 2000 UNITS CAPS Take 2,000 Units by mouth every other day. 02/20/12  Yes Mosie Lukes, MD  famotidine (PEPCID) 20 MG tablet Take 20 mg by mouth at bedtime.   Yes [provider]  isosorbide mononitrate (IMDUR) 30 MG 24 hr tablet Take 1 tablet (30 mg total) by mouth daily.  08/10/21  Yes [name], MD  levocetirizine (XYZAL) 5 MG tablet Take 2.5 mg by mouth daily at 12 noon. 08/03/21  Yes [provider]  losartan (COZAAR) 25 MG tablet Take 1 tablet (25 mg total) by mouth daily. 05/16/21  Yes Debbrah Alar, NP  metoprolol succinate (TOPROL-XL) 25 MG 24 hr tablet TAKE 1 TABLET (25 MG TOTAL) BY MOUTH DAILY. 09/05/21  Yes Mosie Lukes, MD  pantoprazole (PROTONIX) 40 MG tablet Take 1 tablet (40 mg total) by mouth daily. 09/06/21  Yes Mosie Lukes, MD  Ferrous Fumarate-Folic Acid (HEMOCYTE-F) 324-1 MG  TABS Take 1 tablet by mouth daily. Patient not taking: Reported on 09/19/2021 11/29/20   Mosie Lukes, MD  furosemide (LASIX) 20 MG tablet Take 1 tablet (20 mg total) by mouth as needed for fluid or edema (swelling or weight gain of 2lbs overnight or 5lbs in a week). Patient not taking: Reported on 09/19/2021 04/04/21 07/03/21  Marylu Lund., NP  hydrALAZINE (APRESOLINE) 10 MG tablet Take 1 tablet (10 mg total) by mouth 3 (three) times daily. Patient not taking: Reported on 09/19/2021 09/06/21   Mosie Lukes, MD    Physical Exam: Vitals:   09/18/21 2028 09/18/21 2345  BP: (!) 156/54 (!) 167/62  Pulse: 73 69  Resp: 18 17  Temp: 98.2 F (36.8 C)   TempSrc: Oral   SpO2: 100% 100%    Physical Exam Vitals and nursing note reviewed.  Constitutional:      General: She is not in acute distress.    Appearance: Normal appearance. She is not ill-appearing, toxic-appearing or diaphoretic.  HENT:     Head: Normocephalic and atraumatic.     Nose: Nose normal.  Eyes:     General: No scleral icterus. Cardiovascular:     Rate and Rhythm: Normal rate and regular rhythm.     Pulses: Normal pulses.     Heart sounds: Murmur heard.  Pulmonary:     Effort: Pulmonary effort is normal. No respiratory distress.     Breath sounds: Normal breath sounds. No wheezing or rales.  Abdominal:     General: Abdomen is flat. Bowel sounds are normal. There is no  distension.     Palpations: Abdomen is soft.     Tenderness: There is no abdominal tenderness. There is no guarding or rebound.  Musculoskeletal:     Right lower leg: No edema.     Left lower leg: No edema.  Skin:    General: Skin is warm and dry.     Capillary Refill: Capillary refill takes less than 2 seconds.  Neurological:     General: No focal deficit present.     Mental Status: She is alert and oriented to person, place, and time.      Labs on Admission: I have personally reviewed following labs and imaging studies  CBC: Recent Labs  Lab 09/18/21 1159 09/18/21 2047  WBC 8.5 8.2  NEUTROABS  --  6.0  HGB 5.2 Repeated and verified X2.* 5.0*  HCT 17.2 Repeated and verified X2.* 17.5*  MCV 68.8* 74.8*  PLT 257.0 017   Basic Metabolic Panel: Recent Labs  Lab 09/18/21 1159 09/18/21 2047  NA 138 139  K 4.6 4.5  CL 104 107  CO2 24 22  GLUCOSE 88 124*  BUN 36* 39*  CREATININE 1.26* 1.55*  CALCIUM 8.5 8.5*   GFR: Estimated Creatinine Clearance: 13.4 mL/min (A) (by C-G formula based on SCr of 1.55 mg/dL (H)). Liver Function Tests: Recent Labs  Lab 09/18/21 1159  AST 12  ALT 7  ALKPHOS 15*  BILITOT 0.3  PROT 7.4  ALBUMIN 3.5   No results for input(s): "LIPASE", "AMYLASE" in the last 168 hours. No results for input(s): "AMMONIA" in the last 168 hours. Coagulation Profile: No results for input(s): "INR", "PROTIME" in the last 168 hours. Cardiac Enzymes: No results for input(s): "CKTOTAL", "CKMB", "CKMBINDEX", "TROPONINI", "TROPONINIHS" in the last 168 hours. BNP (last 3 results) Recent Labs    03/24/21 1026  PROBNP 178.0*   HbA1C: No results for input(s): "HGBA1C" in the last  72 hours. CBG: No results for input(s): "GLUCAP" in the last 168 hours. Lipid Profile: Recent Labs    09/18/21 1159  CHOL 112  HDL 41.40  LDLCALC 55  TRIG 77.0  CHOLHDL 3   Thyroid Function Tests: Recent Labs    09/18/21 1159  TSH 4.34   Anemia Panel: Recent Labs     09/18/21 1159  VITAMINB12 223   Urine analysis:    Component Value Date/Time   COLORURINE YELLOW 03/22/2021 [place] 03/22/2021 1255   LABSPEC 1.020 03/22/2021 1255   PHURINE 6.5 03/22/2021 1255   GLUCOSEU NEGATIVE 03/22/2021 1255   GLUCOSEU NEGATIVE 08/30/2017 1053   HGBUR NEGATIVE 03/22/2021 1255   HGBUR trace-intact 01/19/2009 0929   BILIRUBINUR NEGATIVE 03/22/2021 1255   BILIRUBINUR negative 02/01/2017 1039   KETONESUR NEGATIVE 03/22/2021 1255   PROTEINUR NEGATIVE 03/22/2021 1255   UROBILINOGEN 0.2 08/30/2017 1053   NITRITE NEGATIVE 03/22/2021 1255   LEUKOCYTESUR SMALL (A) 03/22/2021 1255    Radiological Exams on Admission: I have personally reviewed images No results found.  EKG: My personal interpretation of EKG shows: NSR    Assessment/Plan Principal Problem:   Symptomatic anemia Active Problems:   Heme + stool   Hypothyroidism   Essential hypertension   Persistent atrial fibrillation (HCC)   Stage 3a chronic kidney disease (CKD) (HCC)   Chronic anticoagulation    Assessment and Plan: * Symptomatic anemia Observation telemetry bed. 2 units PRBC transfusion ordered. Verified that pt is not a Jehovah's witness. Repeat CBC in AM.  Heme + stool Pt denies any melena or BRBPR. She thinks she may have seen something pink in the toilet last week but nothing recent. GI has been consulted. Will hold Eliquis unit GI has seen patient. Checking TIBC and iron panel.  Chronic anticoagulation On eliquis for hx of afib/flutter.  Stage 3a chronic kidney disease (CKD) (HCC) Baseline Scr 1.2-1.3. Scr slightly higher than baseline. Hopefully PRBC transfusion will help with renal perfusion.  Persistent atrial fibrillation (HCC) Stable. cont Toprol-XL 25 mg for rate control. On Eliquis for CVA prophylaxis. Holding Eliquis due to heme + stools and anemia.  Essential hypertension Stable. Hold BP meds until after PRBC transfusion completed.    Hypothyroidism Stable. Normal TSH and FT4 yesterday.   DVT prophylaxis: SCDs Code Status: Full Code Family Communication: discussed with pt, dtrs(audrey Kluver and peggy bellow) Disposition Plan: return home  Consults called: EPD has consulted  GI [place] Admission status: Observation, Telemetry bed   Kristopher Oppenheim, DO [facility] 09/19/2021, 1:26 AM

## 2021-09-19 NOTE — Assessment & Plan Note (Signed)
Observation telemetry bed. 2 units PRBC transfusion ordered. Verified that pt is not a Jehovah's witness. Repeat CBC in AM.

## 2021-09-19 NOTE — Progress Notes (Signed)
BP is elevated before second bag of blood, MD on call notified. Hydralazine IV given.

## 2021-09-19 NOTE — Assessment & Plan Note (Addendum)
Stable. Normal TSH and FT4 yesterday.

## 2021-09-19 NOTE — Assessment & Plan Note (Signed)
Stable. cont Toprol-XL 25 mg for rate control. On Eliquis for CVA prophylaxis. Holding Eliquis due to heme + stools and anemia.

## 2021-09-19 NOTE — Progress Notes (Signed)
Received from ED, alert and oriented, can walk with 1 assist, no complained of pain. With small redness in the sacrum. Blood transfusion consent signed and started.

## 2021-09-19 NOTE — Progress Notes (Signed)
Discharge instructions given with daughter present at bedside. Pt to be transported off unit via [place] with all belongings. She remains awake alert and stable at baseline.

## 2021-09-20 ENCOUNTER — Telehealth: Payer: Self-pay

## 2021-09-20 LAB — TYPE AND SCREEN
ABO/RH(D): O POS
Antibody Screen: NEGATIVE
Unit division: 0
Unit division: 0

## 2021-09-20 LAB — BPAM RBC
Blood Product Expiration Date: 202310202359
Blood Product Expiration Date: 202310202359
ISSUE DATE / TIME: 202309190259
ISSUE DATE / TIME: 202309190639
Unit Type and Rh: 5100
Unit Type and Rh: 5100

## 2021-09-20 NOTE — Telephone Encounter (Signed)
Transition Care Management Follow-up Telephone Call Date of discharge and from where: [place] 09-19-21 Dx: symptomatic anemia How have you been since you were released from the hospital? Doing good  Any questions or concerns? No  Items Reviewed: Did the pt receive and understand the discharge instructions provided? Yes  Medications obtained and verified? Yes  Other? No  Any new allergies since your discharge? No  Dietary orders reviewed? Yes Do you have support at home? Yes   Home Care and Equipment/Supplies: Were home health services ordered? no If so, what is the name of the agency? na  Has the agency set up a time to come to the patient's home? not applicable Were any new equipment or medical supplies ordered?  No What is the name of the medical supply agency? na Were you able to get the supplies/equipment? not applicable Do you have any questions related to the use of the equipment or supplies? No  Functional Questionnaire: (I = Independent and D = Dependent) ADLs: I  Bathing/Dressing- I  Meal Prep- I  Eating- I  Maintaining continence- I  Transferring/Ambulation- I  Managing Meds- I  Follow up appointments reviewed:  PCP Hospital f/u appt confirmed? Yes  Scheduled to see Debbrah Alar NP on 09-25-21 @ noon. [facility] f/u appt confirmed? No  . Are transportation arrangements needed? No  If their condition worsens, is the pt aware to call PCP or go to the Emergency Dept.? Yes Was the patient provided with contact information for the PCP's office or ED? Yes Was to pt encouraged to call back with questions or concerns? Yes

## 2021-09-25 ENCOUNTER — Encounter: Payer: Self-pay | Admitting: Family Medicine

## 2021-09-25 ENCOUNTER — Ambulatory Visit (INDEPENDENT_AMBULATORY_CARE_PROVIDER_SITE_OTHER): Payer: Medicare Other | Admitting: Family

## 2021-09-25 VITALS — BP 146/52 | HR 74 | Temp 98.6°F | Resp 16 | Wt 86.0 lb

## 2021-09-25 DIAGNOSIS — K625 Hemorrhage of anus and rectum: Secondary | ICD-10-CM | POA: Diagnosis not present

## 2021-09-25 DIAGNOSIS — I10 Essential (primary) hypertension: Secondary | ICD-10-CM | POA: Diagnosis not present

## 2021-09-25 DIAGNOSIS — N1831 Chronic kidney disease, stage 3a: Secondary | ICD-10-CM

## 2021-09-25 DIAGNOSIS — E538 Deficiency of other specified B group vitamins: Secondary | ICD-10-CM

## 2021-09-25 DIAGNOSIS — Z7901 Long term (current) use of anticoagulants: Secondary | ICD-10-CM

## 2021-09-25 DIAGNOSIS — D649 Anemia, unspecified: Secondary | ICD-10-CM | POA: Diagnosis not present

## 2021-09-25 LAB — BASIC METABOLIC PANEL
BUN: 32 mg/dL — ABNORMAL HIGH (ref 6–23)
CO2: 25 mEq/L (ref 19–32)
Calcium: 8.9 mg/dL (ref 8.4–10.5)
Chloride: 104 mEq/L (ref 96–112)
Creatinine, Ser: 1.24 mg/dL — ABNORMAL HIGH (ref 0.40–1.20)
GFR: 36.94 mL/min — ABNORMAL LOW (ref >60.00)
Glucose, Bld: 120 mg/dL — ABNORMAL HIGH (ref 70–99)
Potassium: 4.7 mEq/L (ref 3.5–5.1)
Sodium: 139 mEq/L (ref 135–145)

## 2021-09-25 LAB — CBC WITH DIFFERENTIAL/PLATELET
Basophils Absolute: 0 10*3/uL (ref 0.0–0.1)
Basophils Relative: 0.6 % (ref 0.0–3.0)
Eosinophils Absolute: 0 10*3/uL (ref 0.0–0.7)
Eosinophils Relative: 0.3 % (ref 0.0–5.0)
HCT: 26.1 % — ABNORMAL LOW (ref 36.0–46.0)
Hemoglobin: 8.3 g/dL — ABNORMAL LOW (ref 12.0–15.0)
Lymphocytes Relative: 23.4 % (ref 12.0–46.0)
Lymphs Abs: 1.4 10*3/uL (ref 0.7–4.0)
MCHC: 31.8 g/dL (ref 30.0–36.0)
MCV: 76.6 fl — ABNORMAL LOW (ref 78.0–100.0)
Monocytes Absolute: 0.4 10*3/uL (ref 0.1–1.0)
Monocytes Relative: 7.1 % (ref 3.0–12.0)
Neutro Abs: 4 10*3/uL (ref 1.4–7.7)
Neutrophils Relative %: 68.6 % (ref 43.0–77.0)
Platelets: 214 10*3/uL (ref 150.0–400.0)
RBC: 3.4 Mil/uL — ABNORMAL LOW (ref 3.87–5.11)
RDW: 22.9 % — ABNORMAL HIGH (ref 11.5–15.5)
WBC: 5.9 10*3/uL (ref 4.0–10.5)

## 2021-09-25 NOTE — Assessment & Plan Note (Signed)
On  Eliquis due to persistent AF.  Will need to carefully monitor blood count.

## 2021-09-25 NOTE — Assessment & Plan Note (Addendum)
Pt received 2 units of blood during hospitalization.  Colo/endo was deferred as pt did not wish to have it done and GI felt she was a high risk. Plan is to continue to follow H/H.  Iron studies actually look ok.    Lab Results  Component Value Date   WBC 7.8 09/19/2021   HGB 9.5 (L) 09/19/2021   HCT 29.5 (L) 09/19/2021   MCV 74.3 (L) 09/19/2021   PLT 256 09/19/2021   b12 level was low. Repeat CBC today.  I will reach out to GI to see if they can get her in for post hospital follow up visit.

## 2021-09-25 NOTE — Assessment & Plan Note (Signed)
See anemia plan.

## 2021-09-25 NOTE — Assessment & Plan Note (Signed)
She was not taking b12 supplements prior to admission but is now back on them.

## 2021-09-25 NOTE — Progress Notes (Signed)
Subjective:   By signing my name below, I, Shehryar Baig, attest that this documentation has been prepared under the direction and in the presence of Debbrah Alar NP. 09/25/2021     Patient ID: [name] [name], female    DOB: Feb 18, 1924, 88 y.o.   MRN: [id number]  Chief Complaint  Patient presents with   Hospitalization Follow-up    Here for follow up   Rectal Bleeding    Complains of bloody stools    HPI Patient is in today for a hospital follow up visit.   Hospital follow up- She was admitted to the hospital on 09/18/2021 for symptomatic anemia. She found blood in her stool while in the hospital.  She had not noticed blood in the stool prior to her hospital visit. She has seen bright red blood during her last few bowel movements. She denies any rectal pain, weakness, shortness of breath. She never received B12 injections. She recent started taking B12 supplements 3 days ago. She has a follow up appointment with a [facility] in November, 2023. She continues taking 2.5 mg eliquis 2x daily PO at this time.   Skin- She reports a dark spot on her hand. She sees a dermatologist and is willing to set up an appointment with them for further evaluation.   Blood pressure- Her blood pressure is doing well during this visit. She continues taking 25 mg losartan daily PO, 25 mg metoprolol succinate daily PO and reports no new issues while taking them.  BP Readings from Last 3 Encounters:  09/25/21 (!) 146/52  09/19/21 (!) 138/48  09/18/21 118/70   Pulse Readings from Last 3 Encounters:  09/25/21 74  09/19/21 65  09/18/21 (!) 48   Reflux- She continues taking 40 mg pantoprazole daily PO and reports doing well while taking it.   Health Maintenance Due  Topic Date Due   Zoster Vaccines- Shingrix (1 of 2) Never done   TETANUS/TDAP  10/29/2020   COVID-19 Vaccine (4 - Pfizer series) 04/10/2021    Past Medical History:  Diagnosis Date   Anemia    Anxiety    Arthritis    hips,  knees   Current use of long term anticoagulation    Diverticulitis of colon 02/06/2015   Gait difficulty    GERD (gastroesophageal reflux disease)    Headache(784.0) 06/22/2012   Hyperlipidemia    Hypertension    IBS (irritable bowel syndrome)    Incontinence    Loss of weight 06/05/2015   Medicare annual wellness visit, subsequent 02/05/2014   Melena    Mild mitral regurgitation    Mild tricuspid regurgitation    Mitral and aortic regurgitation    Mitral valve prolapse    hx of - not seen on recent echoes   Osteopenia    Paroxysmal atrial flutter (HCC)    Pedal edema 10/01/2016   Persistent atrial fibrillation (HCC)    Personal history of colonic polyps    Renal lithiasis 06/26/2015   Sinus bradycardia    Thyroid disease    hypo   Vitamin B12 deficiency 03/16/2016   Vitamin D deficiency     Past Surgical History:  Procedure Laterality Date   BOTOX INJECTION N/A 09/03/2012   Procedure: BOTOX INJECTION;  Surgeon: Inda Castle, MD;  Location: [facility];  Service: Endoscopy;  Laterality: N/A;   BREAST BIOPSY Left    BREAST LUMPECTOMY Right 08/18/2014   Procedure: RIGHT BREAST LUMPECTOMY;  Surgeon: Coralie Keens, MD;  Location: [facility];  Service: General;  Laterality: Right;   CARDIAC ELECTROPHYSIOLOGY MAPPING AND ABLATION     CATARACT EXTRACTION, BILATERAL     ESOPHAGOGASTRODUODENOSCOPY N/A 09/03/2012   Procedure: ESOPHAGOGASTRODUODENOSCOPY (EGD);  Surgeon: Inda Castle, MD;  Location: [facility];  Service: Endoscopy;  Laterality: N/A;   ESOPHAGOGASTRODUODENOSCOPY (EGD) WITH PROPOFOL N/A 12/02/2018   Procedure: ESOPHAGOGASTRODUODENOSCOPY (EGD) WITH PROPOFOL;  Surgeon: [name], MD;  Location: [facility];  Service: Gastroenterology;  Laterality: N/A;   ESOPHAGOSCOPY W/ BOTOX INJECTION     I & D EXTREMITY Right 11/06/2012   Procedure: IRRIGATION AND DEBRIDEMENT EXTREMITY Right Ring Finger;  Surgeon: Tennis Must, MD;  Location: [facility];  Service:  Orthopedics;  Laterality: Right;   PARTIAL HYSTERECTOMY     ovaries left in place   skin cancer removal      Family History  Problem Relation Age of Onset   Diabetes Mother    CVA Mother    Stroke Mother    COPD Father    Rheum arthritis Father    Allergies Daughter    COPD Daughter    Stroke Daughter    COPD Daughter        previous smoker   Stroke Maternal Grandfather    Heart disease Maternal Aunt    Heart disease Maternal Uncle    Colon cancer Neg Hx    Colon polyps Neg Hx    Esophageal cancer Neg Hx    Gallbladder disease Neg Hx    Kidney disease Neg Hx    Heart attack Neg Hx    Hypertension Neg Hx     Social History   Socioeconomic History   Marital status: Widowed    Spouse name: Not on file   Number of children: 5   Years of education: Not on file   Highest education level: Not on file  Occupational History   Occupation: retired    Fish farm manager: RETIRED  Tobacco Use   Smoking status: Never   Smokeless tobacco: Never  Vaping Use   Vaping Use: Never used  Substance and Sexual Activity   Alcohol use: No   Drug use: No   Sexual activity: Not on file    Comment: lives with Daughter, grandson and his family. no dietary restricitons.  Other Topics Concern   Not on file  Social History Narrative   Not on file   Social Determinants of Health   Financial Resource Strain: Not on file  Food Insecurity: Not on file  Transportation Needs: Not on file  Physical Activity: Not on file  Stress: Not on file  Social Connections: Not on file  Intimate Partner Violence: Not on file    Outpatient Medications Prior to Visit  Medication Sig Dispense Refill   ALPRAZolam (XANAX) 0.25 MG tablet TAKE 0.5-1 TABLETS (0.125-0.25 MG TOTAL) BY MOUTH 2 (TWO) TIMES DAILY AS NEEDED FOR ANXIETY. 30 tablet 1   apixaban (ELIQUIS) 2.5 MG TABS tablet TAKE 1 TABLET BY MOUTH TWICE A DAY 60 tablet 10   Cholecalciferol (VITAMIN D) 2000 UNITS CAPS Take 2,000 Units by mouth every other  day.     famotidine (PEPCID) 20 MG tablet Take 20 mg by mouth at bedtime.     Ferrous Fumarate-Folic Acid (HEMOCYTE-F) 324-1 MG TABS Take 1 tablet by mouth daily. 30 tablet 5   isosorbide mononitrate (IMDUR) 30 MG 24 hr tablet Take 1 tablet (30 mg total) by mouth daily. 90 tablet 2   levocetirizine (XYZAL) 5 MG tablet Take 2.5  mg by mouth daily at 12 noon.     losartan (COZAAR) 25 MG tablet Take 1 tablet (25 mg total) by mouth daily. 90 tablet 1   metoprolol succinate (TOPROL-XL) 25 MG 24 hr tablet TAKE 1 TABLET (25 MG TOTAL) BY MOUTH DAILY. 90 tablet 1   pantoprazole (PROTONIX) 40 MG tablet Take 1 tablet (40 mg total) by mouth daily. 90 tablet 1   furosemide (LASIX) 20 MG tablet Take 1 tablet (20 mg total) by mouth as needed for fluid or edema (swelling or weight gain of 2lbs overnight or 5lbs in a week). (Patient not taking: Reported on 09/19/2021) 90 tablet 3   No facility-administered medications prior to visit.    Allergies  Allergen Reactions   Amlodipine Shortness Of Breath   Darifenacin Hydrobromide Other (See Comments)    dizziness   Other Other (See Comments)    dizziness   Sulfonamide Derivatives Other (See Comments)    dizziness   Tramadol Other (See Comments)    Insomnia, anorexia    ROS See HPI    Objective:    Physical Exam Constitutional:      General: She is not in acute distress.    Appearance: Normal appearance. She is not ill-appearing.  HENT:     Head: Normocephalic and atraumatic.     Right Ear: External ear normal.     Left Ear: External ear normal.  Eyes:     Extraocular Movements: Extraocular movements intact.     Pupils: Pupils are equal, round, and reactive to light.  Cardiovascular:     Rate and Rhythm: Normal rate and regular rhythm.     Heart sounds: Normal heart sounds. No murmur heard.    No gallop.  Pulmonary:     Effort: Pulmonary effort is normal. No respiratory distress.     Breath sounds: Normal breath sounds. No wheezing or rales.   Skin:    General: Skin is warm and dry.     Comments: Dry lesion on right dorsal hand  Neurological:     Mental Status: She is alert and oriented to person, place, and time.  Psychiatric:        Judgment: Judgment normal.     BP (!) 146/52   Pulse 74   Temp 98.6 F (37 C) (Oral)   Resp 16   Wt 86 lb (39 kg)   SpO2 99%   BMI 17.36 kg/m  Wt Readings from Last 3 Encounters:  09/25/21 86 lb (39 kg)  09/19/21 86 lb 3.2 oz (39.1 kg)  09/18/21 86 lb 3.2 oz (39.1 kg)       Assessment & Plan:   Problem List Items Addressed This Visit       Unprioritized   Essential hypertension (Chronic)    Blood pressure stable on losartan. Continue same.       Chronic anticoagulation (Chronic)    On  Eliquis due to persistent AF.  Will need to carefully monitor blood count.        Vitamin B12 deficiency    She was not taking b12 supplements prior to admission but is now back on them.       Stage 3a chronic kidney disease (CKD) (HCC) - Primary   Relevant Orders   Basic metabolic panel   BRBPR (bright red blood per rectum)    See anemia plan.       Anemia    Pt received 2 units of blood during hospitalization.  Colo/endo was deferred as pt  did not wish to have it done and GI felt she was a high risk. Plan is to continue to follow H/H.  Iron studies actually look ok.    Lab Results  Component Value Date   WBC 7.8 09/19/2021   HGB 9.5 (L) 09/19/2021   HCT 29.5 (L) 09/19/2021   MCV 74.3 (L) 09/19/2021   PLT 256 09/19/2021   b12 level was low. Repeat CBC today.  I will reach out to GI to see if they can get her in for post hospital follow up visit.        Relevant Orders   CBC with Differential/Platelet     No orders of the defined types were placed in this encounter.   I, Nance Pear, NP, personally preformed the services described in this documentation.  All medical record entries made by the scribe were at my direction and in my presence.  I have reviewed the  chart and discharge instructions (if applicable) and agree that the record reflects my personal performance and is accurate and complete. 09/25/2021   I,Shehryar Baig,acting as a scribe for Nance Pear, NP.,have documented all relevant documentation on the behalf of Nance Pear, NP,as directed by  Nance Pear, NP while in the presence of Nance Pear, NP.   Nance Pear, NP

## 2021-09-25 NOTE — Assessment & Plan Note (Signed)
Blood pressure stable on losartan. Continue same.

## 2021-09-26 ENCOUNTER — Telehealth: Payer: Self-pay | Admitting: Family

## 2021-09-26 DIAGNOSIS — D649 Anemia, unspecified: Secondary | ICD-10-CM

## 2021-09-26 NOTE — Telephone Encounter (Signed)
Spoke to daughter [name] and advised of results and plan of care. Patient was scheduled for CBC 10/02/21. She verbalized understanding instructions to go to ed if dizziness or sob.

## 2021-09-26 NOTE — Telephone Encounter (Signed)
-----   Message from [name], MD sent at 09/26/2021  2:45 PM EDT ----- Lenna Sciara,   Thanks for the note, and I share your concerns. I am working in the hospital this week, and my office hours are solid for weeks to come.   Her age and comorbidities also preclude outpatient endoscopic testing.  So if she is now bleeding and becoming more anemic, and if she is now agreeable to procedures, then she needs to be admitted to either hospital through the ED.  Wilfrid Lund  ----- Message ----- From: Debbrah Alar, NP Sent: 09/26/2021   7:58 AM EDT To: [name], MD  Dr. Loletha Carrow,  Please see discussion below.  She is having active BRBPR. Hgb dropped from 9.5 to 8.3 in the last 1 week.  I am concerned about waiting 6 weeks for GI follow up.  Could you please try to get her in sooner?    Thank,  Kyngston Pickelsimer  ----- Message ----- From: Elias Else, CMA Sent: 09/25/2021   4:04 PM EDT To: Debbrah Alar, NP; Nelida Meuse III, MD; #  Patient has already been scheduled for a follow up on 11-06-2021. ----- Message ----- From: Thornton Park, MD Sent: 09/25/2021   3:30 PM EDT To: Debbrah Alar, NP; Elias Else, CMA; #  We can definitely work to get her a follow-up appointment.   Vivien Rota, this patient is established with Dr. Loletha Carrow. Would you please arrange a hospital follow-up appointment with him or an APP given these ongoing concerns?  Thank you.  KLB ----- Message ----- From: Debbrah Alar, NP Sent: 09/25/2021   1:55 PM EDT To: Thornton Park, MD  Hello,  You consulted on this patient in the hospital. Iron studies look ok.  I saw her today for hospital follow up. She is seeing BRBPR now (I don't think she was experiencing BRBPR in the hospital or prior to admission).  I am checking a follow up CBC today.  Daughter thought that the office scheduled her with GI appointment but I don't see one in the system. Could your office please reach out to them to schedule  follow up?   Thanks,  [employer]

## 2021-09-26 NOTE — Addendum Note (Signed)
Addended by: Debbrah Alar on: 09/26/2021 03:58 PM   Modules accepted: Orders

## 2021-09-26 NOTE — Telephone Encounter (Signed)
Spoke with [name], pt's daughter. Advised her of Dr. Loletha Carrow' thoughts.  She would like to discuss with her mom if she wishes to continue with blood monitoring/prn outpatient transfusions, or if she wishes to pursue further GI evaluation in the hospital to try to determine cause of GI bleed.  We did discuss that endoscopic procedures are accompanied by some risk given her age/comorbidities.  She will discuss further with her mother and call me back on my cell phone to let me know what they decide.

## 2021-09-26 NOTE — Telephone Encounter (Signed)
Please advise pt's daughter that her hgb dropped slightly over the last 1 week from 9.5 to 8.3.  I would like to set up weekly cbc draws for the next 3 weeks.  If she develops weakness or shortness of breath she should go to the ER.  I reached out to Dr. Loletha Carrow to see if he can see her sooner than 11/6.

## 2021-09-26 NOTE — Telephone Encounter (Addendum)
Pt's daughter [name] returned my call to inform me that she spoke with her mother and patient does not wish to do any further procedures to evaluate her GI bleeding, but instead would like to continue serial CBC's and prn transfusions.  Reminded daughter to bring patient to the ED if she develops shortness of breath, increased weakness. She verbalizes understanding.  Will refer to hematology for assistance with prn outpatient transfusions.   Dr. Charlett Blake- FYI.

## 2021-10-02 ENCOUNTER — Other Ambulatory Visit (INDEPENDENT_AMBULATORY_CARE_PROVIDER_SITE_OTHER): Payer: Medicare Other

## 2021-10-02 DIAGNOSIS — D649 Anemia, unspecified: Secondary | ICD-10-CM

## 2021-10-02 LAB — CBC
HCT: 27 % — ABNORMAL LOW (ref 36.0–46.0)
Hemoglobin: 8.5 g/dL — ABNORMAL LOW (ref 12.0–15.0)
MCHC: 31.6 g/dL (ref 30.0–36.0)
MCV: 78 fl (ref 78.0–100.0)
Platelets: 230 10*3/uL (ref 150.0–400.0)
RBC: 3.46 Mil/uL — ABNORMAL LOW (ref 3.87–5.11)
RDW: 25 % — ABNORMAL HIGH (ref 11.5–15.5)
WBC: 6.6 10*3/uL (ref 4.0–10.5)

## 2021-10-06 ENCOUNTER — Encounter: Payer: Self-pay | Admitting: Hematology & Oncology

## 2021-10-06 ENCOUNTER — Inpatient Hospital Stay: Payer: Medicare Other | Admitting: Hematology & Oncology

## 2021-10-06 ENCOUNTER — Inpatient Hospital Stay: Payer: Medicare Other | Attending: Hematology & Oncology

## 2021-10-06 VITALS — BP 131/60 | HR 64 | Temp 97.7°F | Resp 20 | Ht 59.0 in | Wt 85.1 lb

## 2021-10-06 DIAGNOSIS — I4819 Other persistent atrial fibrillation: Secondary | ICD-10-CM | POA: Insufficient documentation

## 2021-10-06 DIAGNOSIS — D500 Iron deficiency anemia secondary to blood loss (chronic): Secondary | ICD-10-CM | POA: Diagnosis not present

## 2021-10-06 DIAGNOSIS — Z79899 Other long term (current) drug therapy: Secondary | ICD-10-CM | POA: Diagnosis not present

## 2021-10-06 DIAGNOSIS — K922 Gastrointestinal hemorrhage, unspecified: Secondary | ICD-10-CM | POA: Diagnosis not present

## 2021-10-06 DIAGNOSIS — N289 Disorder of kidney and ureter, unspecified: Secondary | ICD-10-CM | POA: Insufficient documentation

## 2021-10-06 DIAGNOSIS — Z7901 Long term (current) use of anticoagulants: Secondary | ICD-10-CM | POA: Insufficient documentation

## 2021-10-06 LAB — CMP (CANCER CENTER ONLY)
ALT: 8 U/L (ref 0–44)
AST: 13 U/L — ABNORMAL LOW (ref 15–41)
Albumin: 3.7 g/dL (ref 3.5–5.0)
Alkaline Phosphatase: 14 U/L — ABNORMAL LOW (ref 38–126)
Anion gap: 8 (ref 5–15)
BUN: 32 mg/dL — ABNORMAL HIGH (ref 8–23)
CO2: 26 mmol/L (ref 22–32)
Calcium: 9.3 mg/dL (ref 8.9–10.3)
Chloride: 104 mmol/L (ref 98–111)
Creatinine: 1.34 mg/dL — ABNORMAL HIGH (ref 0.44–1.00)
GFR, Estimated: 37 mL/min — ABNORMAL LOW (ref >60)
Glucose, Bld: 109 mg/dL — ABNORMAL HIGH (ref 70–99)
Potassium: 4 mmol/L (ref 3.5–5.1)
Sodium: 138 mmol/L (ref 135–145)
Total Bilirubin: 0.3 mg/dL (ref 0.3–1.2)
Total Protein: 7.6 g/dL (ref 6.5–8.1)

## 2021-10-06 LAB — CBC WITH DIFFERENTIAL (CANCER CENTER ONLY)
Abs Immature Granulocytes: 0.08 10*3/uL — ABNORMAL HIGH (ref 0.00–0.07)
Basophils Absolute: 0 10*3/uL (ref 0.0–0.1)
Basophils Relative: 1 %
Eosinophils Absolute: 0 10*3/uL (ref 0.0–0.5)
Eosinophils Relative: 0 %
HCT: 27.9 % — ABNORMAL LOW (ref 36.0–46.0)
Hemoglobin: 8.4 g/dL — ABNORMAL LOW (ref 12.0–15.0)
Immature Granulocytes: 1 %
Lymphocytes Relative: 21 %
Lymphs Abs: 1.4 10*3/uL (ref 0.7–4.0)
MCH: 24.8 pg — ABNORMAL LOW (ref 26.0–34.0)
MCHC: 30.1 g/dL (ref 30.0–36.0)
MCV: 82.3 fL (ref 80.0–100.0)
Monocytes Absolute: 0.3 10*3/uL (ref 0.1–1.0)
Monocytes Relative: 5 %
Neutro Abs: 4.8 10*3/uL (ref 1.7–7.7)
Neutrophils Relative %: 72 %
Platelet Count: 262 10*3/uL (ref 150–400)
RBC: 3.39 MIL/uL — ABNORMAL LOW (ref 3.87–5.11)
RDW: 24.6 % — ABNORMAL HIGH (ref 11.5–15.5)
WBC Count: 6.6 10*3/uL (ref 4.0–10.5)
nRBC: 0 % (ref 0.0–0.2)

## 2021-10-06 LAB — RETICULOCYTES
Immature Retic Fract: 12.9 % (ref 2.3–15.9)
RBC.: 3.33 MIL/uL — ABNORMAL LOW (ref 3.87–5.11)
Retic Count, Absolute: 41.6 10*3/uL (ref 19.0–186.0)
Retic Ct Pct: 1.3 % (ref 0.4–3.1)

## 2021-10-06 LAB — SAVE SMEAR(SSMR), FOR PROVIDER SLIDE REVIEW

## 2021-10-06 LAB — FERRITIN: Ferritin: 18 ng/mL (ref 11–307)

## 2021-10-06 NOTE — Progress Notes (Signed)
Referral MD  Reason for Referral: Iron deficiency anemia secondary to GI bleeding   Chief Complaint  Patient presents with   New Patient (Initial Visit)    "Blood in my stools."  : I have been having some bleeding.  HPI: [name] [name] is a very charming 88 year old white female.  She is originally from [place], [place].  She actually went to the same church as my in-laws.  She is on chronic anticoagulation.  She has chronic atrial fibrillation.  She apparently was admitted to the hospital overnight on 09/18/2021.  At that time, her hemoglobin was down to 5.2.  She received some blood in the hospital.  At that time, her ferritin was 5 with an iron saturation of 10%.  I am unsure if she got any IV iron.  I know she was put on oral iron.  She was calmly referred to the [facility] because of the anemia.  That she has been seen by Gastroenterology.  They do not feel comfortable having to do a colonoscopy on her because of her age, anticoagulation, cardiac status.  She is doing okay right now.  She comes in with a daughter.  She says that she has had some bleeding on occasion.  She says that she has had no bleeding before.  There is no history of bleeding in the family.  She has had no bleeding outside of the rectum.  She says been bright red blood.  She does not smoke.  She does not drink.  She is not a vegetarian.  Currently, I would say performance status is probably ECOG 2-3.    Past Medical History:  Diagnosis Date   Anemia    Anxiety    Arthritis    hips, knees   Current use of long term anticoagulation    Diverticulitis of colon 02/06/2015   Gait difficulty    GERD (gastroesophageal reflux disease)    Headache(784.0) 06/22/2012   Hyperlipidemia    Hypertension    IBS (irritable bowel syndrome)    Incontinence    Loss of weight 06/05/2015   Medicare annual wellness visit, subsequent 02/05/2014   Melena    Mild mitral regurgitation    Mild tricuspid  regurgitation    Mitral and aortic regurgitation    Mitral valve prolapse    hx of - not seen on recent echoes   Osteopenia    Paroxysmal atrial flutter ([place])    Pedal edema 10/01/2016   Persistent atrial fibrillation (HCC)    Personal history of colonic polyps    Renal lithiasis 06/26/2015   Sinus bradycardia    Thyroid disease    hypo   Vitamin B12 deficiency 03/16/2016   Vitamin D deficiency   :   Past Surgical History:  Procedure Laterality Date   BOTOX INJECTION N/A 09/03/2012   Procedure: BOTOX INJECTION;  Surgeon: Inda Castle, MD;  Location: [facility];  Service: Endoscopy;  Laterality: N/A;   BREAST BIOPSY Left    BREAST LUMPECTOMY Right 08/18/2014   Procedure: RIGHT BREAST LUMPECTOMY;  Surgeon: Coralie Keens, MD;  Location: [facility];  Service: General;  Laterality: Right;   CARDIAC ELECTROPHYSIOLOGY MAPPING AND ABLATION     CATARACT EXTRACTION, BILATERAL     ESOPHAGOGASTRODUODENOSCOPY N/A 09/03/2012   Procedure: ESOPHAGOGASTRODUODENOSCOPY (EGD);  Surgeon: Inda Castle, MD;  Location: [facility];  Service: Endoscopy;  Laterality: N/A;   ESOPHAGOGASTRODUODENOSCOPY (EGD) WITH PROPOFOL N/A 12/02/2018   Procedure: ESOPHAGOGASTRODUODENOSCOPY (EGD) WITH PROPOFOL;  Surgeon:  [name], MD;  Location: [facility];  Service: Gastroenterology;  Laterality: N/A;   ESOPHAGOSCOPY W/ BOTOX INJECTION     I & D EXTREMITY Right 11/06/2012   Procedure: IRRIGATION AND DEBRIDEMENT EXTREMITY Right Ring Finger;  Surgeon: Tennis Must, MD;  Location: [facility];  Service: Orthopedics;  Laterality: Right;   PARTIAL HYSTERECTOMY     ovaries left in place   skin cancer removal    :   Current Outpatient Medications:    ALPRAZolam (XANAX) 0.25 MG tablet, TAKE 0.5-1 TABLETS (0.125-0.25 MG TOTAL) BY MOUTH 2 (TWO) TIMES DAILY AS NEEDED FOR ANXIETY., Disp: 30 tablet, Rfl: 1   apixaban (ELIQUIS) 2.5 MG TABS tablet, TAKE 1 TABLET BY MOUTH TWICE A DAY, Disp: 60 tablet, Rfl:  10   Cholecalciferol (VITAMIN D) 2000 UNITS CAPS, Take 2,000 Units by mouth every other day., Disp: , Rfl:    famotidine (PEPCID) 20 MG tablet, Take 20 mg by mouth at bedtime., Disp: , Rfl:    Ferrous Fumarate-Folic Acid (HEMOCYTE-F) 324-1 MG TABS, Take 1 tablet by mouth daily., Disp: 30 tablet, Rfl: 5   isosorbide mononitrate (IMDUR) 30 MG 24 hr tablet, Take 1 tablet (30 mg total) by mouth daily., Disp: 90 tablet, Rfl: 2   levocetirizine (XYZAL) 5 MG tablet, Take 2.5 mg by mouth daily at 12 noon., Disp: , Rfl:    losartan (COZAAR) 25 MG tablet, Take 1 tablet (25 mg total) by mouth daily., Disp: 90 tablet, Rfl: 1   metoprolol succinate (TOPROL-XL) 25 MG 24 hr tablet, TAKE 1 TABLET (25 MG TOTAL) BY MOUTH DAILY., Disp: 90 tablet, Rfl: 1   pantoprazole (PROTONIX) 40 MG tablet, Take 1 tablet (40 mg total) by mouth daily., Disp: 90 tablet, Rfl: 1   furosemide (LASIX) 20 MG tablet, Take 1 tablet (20 mg total) by mouth as needed for fluid or edema (swelling or weight gain of 2lbs overnight or 5lbs in a week). (Patient not taking: Reported on 09/19/2021), Disp: 90 tablet, Rfl: 3:  :   Allergies  Allergen Reactions   Amlodipine Shortness Of Breath   Darifenacin Hydrobromide Other (See Comments)    dizziness   Other Other (See Comments)    dizziness   Sulfonamide Derivatives Other (See Comments)    dizziness   Tramadol Other (See Comments)    Insomnia, anorexia  :   Family History  Problem Relation Age of Onset   Diabetes Mother    CVA Mother    Stroke Mother    COPD Father    Rheum arthritis Father    Allergies Daughter    COPD Daughter    Stroke Daughter    COPD Daughter        previous smoker   Stroke Maternal Grandfather    Heart disease Maternal Aunt    Heart disease Maternal Uncle    Colon cancer Neg Hx    Colon polyps Neg Hx    Esophageal cancer Neg Hx    Gallbladder disease Neg Hx    Kidney disease Neg Hx    Heart attack Neg Hx    Hypertension Neg Hx   :   Social  History   Socioeconomic History   Marital status: Widowed    Spouse name: Not on file   Number of children: 5   Years of education: Not on file   Highest education level: Not on file  Occupational History   Occupation: retired    Fish farm manager: RETIRED  Tobacco Use  Smoking status: Never   Smokeless tobacco: Never  Vaping Use   Vaping Use: Never used  Substance and Sexual Activity   Alcohol use: No   Drug use: No   Sexual activity: Not Currently    Comment: lives with Daughter, grandson and his family. no dietary restricitons.  Other Topics Concern   Not on file  Social History Narrative   Not on file   Social Determinants of Health   Financial Resource Strain: Not on file  Food Insecurity: Not on file  Transportation Needs: Not on file  Physical Activity: Not on file  Stress: Not on file  Social Connections: Not on file  Intimate Partner Violence: Not on file  :  Review of Systems  Constitutional:  Positive for malaise/fatigue.  HENT: Negative.    Eyes: Negative.   Respiratory: Negative.    Cardiovascular:  Positive for palpitations.  Gastrointestinal:  Positive for abdominal pain and blood in stool.  Genitourinary: Negative.   Musculoskeletal: Negative.   Skin: Negative.   Neurological: Negative.   Endo/Heme/Allergies: Negative.   Psychiatric/Behavioral: Negative.       Exam: Her vital signs show temperature of 97.7.  Pulse 64.  Blood pressure 131/60.  Weight is 85 pounds.  '@IPVITALS'$ @ Physical Exam Vitals reviewed.  HENT:     Head: Normocephalic and atraumatic.  Eyes:     Pupils: Pupils are equal, round, and reactive to light.  Cardiovascular:     Rate and Rhythm: Normal rate and regular rhythm.     Heart sounds: Normal heart sounds.  Pulmonary:     Effort: Pulmonary effort is normal.     Breath sounds: Normal breath sounds.  Abdominal:     General: Bowel sounds are normal.     Palpations: Abdomen is soft.  Musculoskeletal:        General: No  tenderness or deformity. Normal range of motion.     Cervical back: Normal range of motion.  Lymphadenopathy:     Cervical: No cervical adenopathy.  Skin:    General: Skin is warm and dry.     Findings: No erythema or rash.  Neurological:     Mental Status: She is alert and oriented to person, place, and time.  Psychiatric:        Behavior: Behavior normal.        Thought Content: Thought content normal.        Judgment: Judgment normal.     Recent Labs    10/06/21 1351  WBC 6.6  HGB 8.4*  HCT 27.9*  PLT 262    Recent Labs    10/06/21 1351  NA 138  K 4.0  CL 104  CO2 26  GLUCOSE 109*  BUN 32*  CREATININE 1.34*  CALCIUM 9.3    Blood smear review: Mild anisocytosis and poikilocytosis.  There is no nucleated red blood cells.  There are no schistocytes.  She may have a couple teardrop cells.  I see no nucleated red blood cells.  White blood cells were normal in morphology and maturation.  There is no immature myeloid or lymphoid forms.  There are no hypersegmented polys.  Platelets are adequate in number and size.  Pathology: None    Assessment and Plan: [name] [name] is a very charming 88 year old white female.  She has GI bleeding.  Unfortunately, I do not think this will ever be identified as a source.  I realize that she had a CT scan done about a year ago which showed diverticulosis.  As such, I suspect this probably is diverticulosis that is the source of the bleeding.  She says that it is bright red blood per rectum.  Her Kandice Robinsons MCV is coming up.  She is on oral iron.  She is tolerating this pretty well well.  I think one of the problems is that she has renal insufficiency.  I have to believe that her erythropoietin level is going be lower.  If so, we probably will be able to use ESA on her.  She has been on anticoagulation.  I am not sure that cardiology would want to stop this.  Right now, I have will like to get her back in about 3 weeks or so.  We will see how  everything looks.  If we have to give her IV iron or ESA, we can get her back before hand.  She is incredibly charming.  She comes in with her daughter.  I had a delightful time talking to both of them.  I gave her a prayer blanket and she is very grateful for that.

## 2021-10-07 LAB — ERYTHROPOIETIN: Erythropoietin: 38 m[IU]/mL — ABNORMAL HIGH (ref 2.6–18.5)

## 2021-10-09 ENCOUNTER — Ambulatory Visit: Payer: Medicare Other | Admitting: Family

## 2021-10-09 ENCOUNTER — Encounter: Payer: Self-pay | Admitting: Hematology & Oncology

## 2021-10-09 LAB — IRON AND IRON BINDING CAPACITY (CC-WL,HP ONLY)
Iron: 32 ug/dL (ref 28–170)
Saturation Ratios: 8 % — ABNORMAL LOW (ref 10.4–31.8)
TIBC: 393 ug/dL (ref 250–450)
UIBC: 361 ug/dL (ref 148–442)

## 2021-10-09 NOTE — Addendum Note (Signed)
Addended by: Burney Gauze R on: 10/09/2021 04:11 PM   Modules accepted: Orders

## 2021-10-10 ENCOUNTER — Other Ambulatory Visit: Payer: Self-pay | Admitting: Pharmacist

## 2021-10-10 NOTE — Progress Notes (Signed)
Monoferric changed to Venofer 200 mg x 3 doses per Dr. Antonieta Pert instructions. Venofer is the payor's preferred product.

## 2021-10-11 NOTE — Progress Notes (Deleted)
Cardiology Office Note:    Date:  10/11/2021   ID:  [name] [name], DOB 10-Oct-1924, MRN [id number]  PCP:  Mosie Lukes, MD   [facility] Providers Cardiologist:  [name], MD {    Referring MD: Mosie Lukes, MD    History of Present Illness:    [name] [name] is a 88 y.o. female with a hx of persistent atrial fibrillation, HTN, sinus bradycardia, HLD, anemia, anxiety, arthritis, diverticulosis, GERD, and thyroid disease who was previously followed by Dr. [name] who now presents to clinic for follow-up.  Per review of the record, she been felt to have had chronic atrial fib. TTE 2018 showed EF 55-60%, mild MR, moderate LAE, mild TR, normal PASP. She was seen in the ER 06/20/17 with discomfort in chest, upper abdomen and back. CT angio chest/abd/pelvis showed no acute dissection/ aneurysm, + atherosclerotic vascular disease with suspected renal artery stenosis, possible 16 mm hyperenhancing splenic mass, sigmoid diverticular disease . Out-patient w/u of splenic mass recommended. She was then re-hospitalized with weakness and decreased appetite found to be hyponatremic and slightly bradycardic.  She was treated for possible SIADH. Initially the patient was bradycardic and her beta-blocker was held but heart rate increased to greater than 100. She was seen in consultation by Dr. Marlou Porch who felt that her rhythm was actually atrial flutter appearing. He advised continued rate control on low-dose Toprol and watch for bradycardia. Her Coumadin was switched to Eliquis during begininng of Covid pandemic. Event monitoring in 09/2018 with findings of persistent atrial flutter with variable block with minimum HR 45, maximum HR 136 and average HR 65 BPM. 1 episode of ventricular tachycardia lasting 8 beats.  Was last seen in clinic on 04/2021 where she was doing well from a CV standpoint.  Was hospitalized on 09/18-09/19/23 for hemoglobin 5.2. Colonoscopy was deferred due to age. She was  started on PO iron. Had follow-up with heme as well. Apixaban was not stopped.   Today, ***   Past Medical History:  Diagnosis Date   Anemia    Anxiety    Arthritis    hips, knees   Current use of long term anticoagulation    Diverticulitis of colon 02/06/2015   Gait difficulty    GERD (gastroesophageal reflux disease)    Headache(784.0) 06/22/2012   Hyperlipidemia    Hypertension    IBS (irritable bowel syndrome)    Incontinence    Loss of weight 06/05/2015   Medicare annual wellness visit, subsequent 02/05/2014   Melena    Mild mitral regurgitation    Mild tricuspid regurgitation    Mitral and aortic regurgitation    Mitral valve prolapse    hx of - not seen on recent echoes   Osteopenia    Paroxysmal atrial flutter ([place])    Pedal edema 10/01/2016   Persistent atrial fibrillation (HCC)    Personal history of colonic polyps    Renal lithiasis 06/26/2015   Sinus bradycardia    Thyroid disease    hypo   Vitamin B12 deficiency 03/16/2016   Vitamin D deficiency     Past Surgical History:  Procedure Laterality Date   BOTOX INJECTION N/A 09/03/2012   Procedure: BOTOX INJECTION;  Surgeon: Inda Castle, MD;  Location: [facility];  Service: Endoscopy;  Laterality: N/A;   BREAST BIOPSY Left    BREAST LUMPECTOMY Right 08/18/2014   Procedure: RIGHT BREAST LUMPECTOMY;  Surgeon: Coralie Keens, MD;  Location: [place];  Service: General;  Laterality: Right;   CARDIAC ELECTROPHYSIOLOGY MAPPING AND ABLATION     CATARACT EXTRACTION, BILATERAL     ESOPHAGOGASTRODUODENOSCOPY N/A 09/03/2012   Procedure: ESOPHAGOGASTRODUODENOSCOPY (EGD);  Surgeon: Inda Castle, MD;  Location: [facility];  Service: Endoscopy;  Laterality: N/A;   ESOPHAGOGASTRODUODENOSCOPY (EGD) WITH PROPOFOL N/A 12/02/2018   Procedure: ESOPHAGOGASTRODUODENOSCOPY (EGD) WITH PROPOFOL;  Surgeon: [name], MD;  Location: [facility];  Service: Gastroenterology;  Laterality: N/A;   ESOPHAGOSCOPY W/  BOTOX INJECTION     I & D EXTREMITY Right 11/06/2012   Procedure: IRRIGATION AND DEBRIDEMENT EXTREMITY Right Ring Finger;  Surgeon: Tennis Must, MD;  Location: [facility];  Service: Orthopedics;  Laterality: Right;   PARTIAL HYSTERECTOMY     ovaries left in place   skin cancer removal      Current Medications: No outpatient medications have been marked as taking for the 10/13/21 encounter (Appointment) with [name], MD.     Allergies:   Amlodipine, Darifenacin hydrobromide, Other, Sulfonamide derivatives, and Tramadol   Social History   Socioeconomic History   Marital status: Widowed    Spouse name: Not on file   Number of children: 5   Years of education: Not on file   Highest education level: Not on file  Occupational History   Occupation: retired    Fish farm manager: RETIRED  Tobacco Use   Smoking status: Never   Smokeless tobacco: Never  Vaping Use   Vaping Use: Never used  Substance and Sexual Activity   Alcohol use: No   Drug use: No   Sexual activity: Not Currently    Comment: lives with Daughter, grandson and his family. no dietary restricitons.  Other Topics Concern   Not on file  Social History Narrative   Not on file   Social Determinants of Health   Financial Resource Strain: Not on file  Food Insecurity: Not on file  Transportation Needs: Not on file  Physical Activity: Not on file  Stress: Not on file  Social Connections: Not on file     Family History: The patient's family history includes Allergies in her daughter; COPD in her daughter, daughter, and father; CVA in her mother; Diabetes in her mother; Heart disease in her maternal aunt and maternal uncle; Rheum arthritis in her father; Stroke in her daughter, maternal grandfather, and mother. There is no history of Colon cancer, Colon polyps, Esophageal cancer, Gallbladder disease, Kidney disease, Heart attack, or Hypertension.  ROS:   Review of Systems  Constitutional:  Negative for chills,  diaphoresis and fever.  HENT:  Positive for congestion. Negative for nosebleeds and sore throat.   Eyes:  Negative for discharge.  Respiratory:  Negative for cough and shortness of breath.   Cardiovascular:  Positive for leg swelling. Negative for chest pain, palpitations, orthopnea, claudication and PND.  Gastrointestinal:  Negative for abdominal pain, blood in stool, diarrhea, heartburn, nausea and vomiting.  Genitourinary:  Negative for dysuria, hematuria and urgency.  Musculoskeletal:  Negative for falls.  Neurological:  Negative for dizziness and seizures.  Endo/Heme/Allergies:  Negative for environmental allergies.  Psychiatric/Behavioral:  Negative for depression and hallucinations.      EKGs/Labs/Other Studies Reviewed:    The following studies were reviewed today:  Bilateral LE Venous Doppler 03/22/2021: IMPRESSION: No evidence of deep venous thrombosis in either lower extremity.  CT Abdomen/Pelvis 08/17/2020: IMPRESSION: Findings suspicious for mild diffuse colitis. No evidence of abscess or other complication.   Colonic diverticulosis, without radiographic evidence of diverticulitis.   Stable  large diverticulum arising from the lower thoracic esophagus.   Aortic Atherosclerosis (ICD10-I70.0).  Monitor 09/2018 Study Highlights   Persistent atrial flutter with variable block with minimum HR 45, maximum HR 136 and average HR 65 BPM. 1 episode of ventricular tachycardia lasting 8 beats.   Persistent atrial flutter with variable block with minimum HR 45, maximum HR 136 and average HR 65 BPM. Continue current medical management.      Echo 2018 Study Conclusions   - Left ventricle: The cavity size was normal. There was mild    concentric hypertrophy. Systolic function was normal. The    estimated ejection fraction was in the range of 55% to 60%. Wall    motion was normal; there were no regional wall motion    abnormalities. The study is not technically sufficient to  allow    evaluation of LV diastolic function.  - Aortic valve: Transvalvular velocity was within the normal range.    There was no stenosis. There was no regurgitation.  - Mitral valve: Transvalvular velocity was within the normal range.    There was no evidence for stenosis. There was mild regurgitation.  - Left atrium: The atrium was moderately dilated.  - Right ventricle: Systolic function was normal.  - Atrial septum: No defect or patent foramen ovale was identified.  - Tricuspid valve: There was mild regurgitation.  - Pulmonary arteries: Systolic pressure was within the normal    range. PA peak pressure: 36 mm Hg (S).     EKGs    EKG is personally reviewed. 04/20/2021: Atrial fibrillation. Rate 71 bpm. Anterior Q waves. 05/18/20: EKG was not ordered.   Recent Labs: 10/24/2020: Magnesium 1.8 03/24/2021: Pro B Natriuretic peptide (BNP) 178.0 09/18/2021: TSH 4.34 10/06/2021: ALT 8; BUN 32; Creatinine 1.34; Hemoglobin 8.4; Platelet Count 262; Potassium 4.0; Sodium 138   Recent Lipid Panel    Component Value Date/Time   CHOL 112 09/18/2021 1159   TRIG 77.0 09/18/2021 1159   HDL 41.40 09/18/2021 1159   CHOLHDL 3 09/18/2021 1159   VLDL 15.4 09/18/2021 1159   LDLCALC 55 09/18/2021 1159   LDLCALC 72 10/29/2019 1358   LDLDIRECT 72.1 01/19/2009 1026     Physical Exam:    VS:  There were no vitals taken for this visit.    Wt Readings from Last 3 Encounters:  10/06/21 85 lb 1.9 oz (38.6 kg)  09/25/21 86 lb (39 kg)  09/19/21 86 lb 3.2 oz (39.1 kg)     GEN: Well nourished, well developed in no acute distress HEENT: Normal NECK: No JVD; No carotid bruits LYMPHATICS: No lymphadenopathy CARDIAC: Irregular, no murmurs, rubs, gallops RESPIRATORY:  Clear to auscultation without rales, wheezing or rhonchi  ABDOMEN: Soft, non-tender, non-distended MUSCULOSKELETAL:  No edema; No deformity  SKIN: Warm and dry NEUROLOGIC:  Alert and oriented x 3 PSYCHIATRIC:  Normal affect    ASSESSMENT:    No diagnosis found.   PLAN:    In order of problems listed above:  #Persistent Afib: #Paroxysmal Aflutter: CHADs-vasc 5. On eliquis and low dose metop. ?? Stop apixaban -Continue apixaban 2.'5mg'$  BID*** -Continue metop '25mg'$  XL daily  #HTN: -Controlled ramipril '10mg'$  daily -Continue metop '25mg'$  XL daily -Continue imdur '30mg'$  daily  #HLD: -Not on statin; given age, will not re-check lipids  #Mild MR: Asymptomatic. No further monitoring indicated unless clinical change.  Follow-up:   6 months.  Medication Adjustments/Labs and Tests Ordered: Current medicines are reviewed at length with the patient today.  Concerns regarding medicines are  outlined above.   No orders of the defined types were placed in this encounter.  No orders of the defined types were placed in this encounter.  There are no Patient Instructions on file for this visit.   I,Mathew Stumpf,acting as a Education administrator for [name], MD.,have documented all relevant documentation on the behalf of [name], MD,as directed by  [name], MD while in the presence of [name], MD.  I, [name], MD, have reviewed all documentation for this visit. The documentation on 10/11/21 for the exam, diagnosis, procedures, and orders are all accurate and complete.  Signed, [name], MD  10/11/2021 4:32 PM    [facility]

## 2021-10-12 ENCOUNTER — Inpatient Hospital Stay: Payer: Medicare Other

## 2021-10-12 VITALS — BP 154/54 | HR 66 | Temp 97.6°F | Resp 19

## 2021-10-12 DIAGNOSIS — N289 Disorder of kidney and ureter, unspecified: Secondary | ICD-10-CM | POA: Diagnosis not present

## 2021-10-12 DIAGNOSIS — K922 Gastrointestinal hemorrhage, unspecified: Secondary | ICD-10-CM | POA: Diagnosis not present

## 2021-10-12 DIAGNOSIS — Z79899 Other long term (current) drug therapy: Secondary | ICD-10-CM | POA: Diagnosis not present

## 2021-10-12 DIAGNOSIS — Z7901 Long term (current) use of anticoagulants: Secondary | ICD-10-CM

## 2021-10-12 DIAGNOSIS — D500 Iron deficiency anemia secondary to blood loss (chronic): Secondary | ICD-10-CM | POA: Diagnosis not present

## 2021-10-12 DIAGNOSIS — I4819 Other persistent atrial fibrillation: Secondary | ICD-10-CM | POA: Diagnosis not present

## 2021-10-12 MED ORDER — SODIUM CHLORIDE 0.9 % IV SOLN
200.0000 mg | Freq: Once | INTRAVENOUS | Status: AC
  Administered 2021-10-12: 200 mg via INTRAVENOUS
  Filled 2021-10-12: qty 200

## 2021-10-12 MED ORDER — SODIUM CHLORIDE 0.9 % IV SOLN: Freq: Once | INTRAVENOUS | Status: AC

## 2021-10-12 NOTE — Patient Instructions (Signed)

## 2021-10-13 ENCOUNTER — Encounter: Payer: Self-pay | Admitting: Cardiology

## 2021-10-13 ENCOUNTER — Ambulatory Visit: Payer: Medicare Other | Attending: Cardiology | Admitting: Cardiology

## 2021-10-13 VITALS — BP 128/78 | HR 64 | Ht 59.0 in | Wt 86.0 lb

## 2021-10-13 DIAGNOSIS — I340 Nonrheumatic mitral (valve) insufficiency: Secondary | ICD-10-CM | POA: Diagnosis not present

## 2021-10-13 DIAGNOSIS — D500 Iron deficiency anemia secondary to blood loss (chronic): Secondary | ICD-10-CM

## 2021-10-13 DIAGNOSIS — I4821 Permanent atrial fibrillation: Secondary | ICD-10-CM | POA: Diagnosis not present

## 2021-10-13 DIAGNOSIS — I080 Rheumatic disorders of both mitral and aortic valves: Secondary | ICD-10-CM | POA: Diagnosis not present

## 2021-10-13 DIAGNOSIS — I10 Essential (primary) hypertension: Secondary | ICD-10-CM | POA: Diagnosis not present

## 2021-10-13 DIAGNOSIS — E782 Mixed hyperlipidemia: Secondary | ICD-10-CM

## 2021-10-13 NOTE — Patient Instructions (Signed)
Medication Instructions:   Your physician recommends that you continue on your current medications as directed. Please refer to the Current Medication list given to you today.  *If you need a refill on your cardiac medications before your next appointment, please call your pharmacy*   Follow-Up: At [facility], you and your health needs are our priority.  As part of our continuing mission to provide you with exceptional heart care, we have created designated Provider Care Teams.  These Care Teams include your primary Cardiologist (physician) and Advanced Practice Providers (APPs -  Physician Assistants and Nurse Practitioners) who all work together to provide you with the care you need, when you need it.  We recommend signing up for the patient portal called "MyChart".  Sign up information is provided on this After Visit Summary.  MyChart is used to connect with patients for Virtual Visits (Telemedicine).  Patients are able to view lab/test results, encounter notes, upcoming appointments, etc.  Non-urgent messages can be sent to your provider as well.   To learn more about what you can do with MyChart, go to [email or web address].    Your next appointment:   6 month(s)  The format for your next appointment:   In Person  Provider:   Heather E Pemberton, MD     Important Information About Sugar       

## 2021-10-13 NOTE — Progress Notes (Signed)
Cardiology Office Note:    Date:  10/13/2021   ID:  [name] [name], DOB 11-Apr-1924, MRN [id number]  PCP:  Mosie Lukes, MD   [facility] Providers Cardiologist:  [name], MD {    Referring MD: Mosie Lukes, MD    History of Present Illness:    [name] [name] is a 88 y.o. female with a hx of persistent atrial fibrillation, HTN, sinus bradycardia, HLD, anemia, anxiety, arthritis, diverticulosis, GERD, and thyroid disease who was previously followed by Dr. [name] who now presents to clinic for follow-up.  Per review of the record, she been felt to have had chronic atrial fib. TTE 2018 showed EF 55-60%, mild MR, moderate LAE, mild TR, normal PASP. She was seen in the ER 06/20/17 with discomfort in chest, upper abdomen and back. CT angio chest/abd/pelvis showed no acute dissection/ aneurysm, + atherosclerotic vascular disease with suspected renal artery stenosis, possible 16 mm hyperenhancing splenic mass, sigmoid diverticular disease . Out-patient w/u of splenic mass recommended. She was then re-hospitalized with weakness and decreased appetite found to be hyponatremic and slightly bradycardic.  She was treated for possible SIADH. Initially the patient was bradycardic and her beta-blocker was held but heart rate increased to greater than 100. She was seen in consultation by Dr. Marlou Porch who felt that her rhythm was actually atrial flutter appearing. He advised continued rate control on low-dose Toprol and watch for bradycardia. Her Coumadin was switched to Eliquis during begininng of Covid pandemic. Event monitoring in 09/2018 with findings of persistent atrial flutter with variable block with minimum HR 45, maximum HR 136 and average HR 65 BPM. 1 episode of ventricular tachycardia lasting 8 beats.  Was last seen in clinic on 04/2021 where she was doing well from a CV standpoint.  Was hospitalized on 09/18-09/19/23 for hemoglobin 5.2. Colonoscopy was deferred due to age. She was  started on PO iron. Had follow-up with heme as well. Apixaban was not stopped.   Today, she is accompanied by a family member. The patient states that yesterday she received an iron infusion. She is feeling better today with increased energy. She is otherwise feeling well. No chest pain, SOB, lightheadedness, dizziness or worsening dyspnea. Her LE edema has improved significantly and is not taking lasix.  She notes that she did have hematochezia recently; she is not sure if this is still occurring.  Typically she tries to move carefully using a walker for assistance. She denies any dizziness or syncope.  She denies any palpitations, chest pain, or shortness of breath. No headaches, orthopnea, or PND.  We discussed the risks vs benefits of coming off of apixaban. She would like to continue for now but if any signs of re-bleeding, will stop at that time.   Past Medical History:  Diagnosis Date   Anemia    Anxiety    Arthritis    hips, knees   Current use of long term anticoagulation    Diverticulitis of colon 02/06/2015   Gait difficulty    GERD (gastroesophageal reflux disease)    Headache(784.0) 06/22/2012   Hyperlipidemia    Hypertension    IBS (irritable bowel syndrome)    Incontinence    Loss of weight 06/05/2015   Medicare annual wellness visit, subsequent 02/05/2014   Melena    Mild mitral regurgitation    Mild tricuspid regurgitation    Mitral and aortic regurgitation    Mitral valve prolapse    hx of - not seen on recent echoes  Osteopenia    Paroxysmal atrial flutter (HCC)    Pedal edema 10/01/2016   Persistent atrial fibrillation (HCC)    Personal history of colonic polyps    Renal lithiasis 06/26/2015   Sinus bradycardia    Thyroid disease    hypo   Vitamin B12 deficiency 03/16/2016   Vitamin D deficiency     Past Surgical History:  Procedure Laterality Date   BOTOX INJECTION N/A 09/03/2012   Procedure: BOTOX INJECTION;  Surgeon: Inda Castle, MD;  Location: [facility]  ENDOSCOPY;  Service: Endoscopy;  Laterality: N/A;   BREAST BIOPSY Left    BREAST LUMPECTOMY Right 08/18/2014   Procedure: RIGHT BREAST LUMPECTOMY;  Surgeon: Coralie Keens, MD;  Location: [facility];  Service: General;  Laterality: Right;   CARDIAC ELECTROPHYSIOLOGY MAPPING AND ABLATION     CATARACT EXTRACTION, BILATERAL     ESOPHAGOGASTRODUODENOSCOPY N/A 09/03/2012   Procedure: ESOPHAGOGASTRODUODENOSCOPY (EGD);  Surgeon: Inda Castle, MD;  Location: [facility];  Service: Endoscopy;  Laterality: N/A;   ESOPHAGOGASTRODUODENOSCOPY (EGD) WITH PROPOFOL N/A 12/02/2018   Procedure: ESOPHAGOGASTRODUODENOSCOPY (EGD) WITH PROPOFOL;  Surgeon: [name], MD;  Location: [facility] END[facility];  Service: Gastroenterology;  Laterality: N/A;   ESOPHAGOSCOPY W/ BOTOX INJECTION     I & D EXTREMITY Right 11/06/2012   Procedure: IRRIGATION AND DEBRIDEMENT EXTREMITY Right Ring Finger;  Surgeon: Tennis Must, MD;  Location: [place] [facility];  Service: Orthopedics;  Laterality: Right;   PARTIAL HYSTERECTOMY     ovaries left in place   skin cancer removal      Current Medications: Current Meds  Medication Sig   ALPRAZolam (XANAX) 0.25 MG tablet TAKE 0.5-1 TABLETS (0.125-0.25 MG TOTAL) BY MOUTH 2 (TWO) TIMES DAILY AS NEEDED FOR ANXIETY.   apixaban (ELIQUIS) 2.5 MG TABS tablet TAKE 1 TABLET BY MOUTH TWICE A DAY   Cholecalciferol (VITAMIN D) 2000 UNITS CAPS Take 2,000 Units by mouth every other day.   famotidine (PEPCID) 20 MG tablet Take 20 mg by mouth at bedtime.   Ferrous Fumarate-Folic Acid (HEMOCYTE-F) 324-1 MG TABS Take 1 tablet by mouth daily.   furosemide (LASIX) 20 MG tablet Take 1 tablet (20 mg total) by mouth as needed for fluid or edema (swelling or weight gain of 2lbs overnight or 5lbs in a week).   isosorbide mononitrate (IMDUR) 30 MG 24 hr tablet Take 1 tablet (30 mg total) by mouth daily.   levocetirizine (XYZAL) 5 MG tablet Take 2.5 mg by mouth daily at 12 noon.   losartan (COZAAR) 25 MG  tablet Take 1 tablet (25 mg total) by mouth daily.   metoprolol succinate (TOPROL-XL) 25 MG 24 hr tablet TAKE 1 TABLET (25 MG TOTAL) BY MOUTH DAILY.   pantoprazole (PROTONIX) 40 MG tablet Take 1 tablet (40 mg total) by mouth daily.     Allergies:   Amlodipine, Darifenacin hydrobromide, Other, Sulfonamide derivatives, and Tramadol   Social History   Socioeconomic History   Marital status: Widowed    Spouse name: Not on file   Number of children: 5   Years of education: Not on file   Highest education level: Not on file  Occupational History   Occupation: retired    Fish farm manager: RETIRED  Tobacco Use   Smoking status: Never   Smokeless tobacco: Never  Vaping Use   Vaping Use: Never used  Substance and Sexual Activity   Alcohol use: No   Drug use: No   Sexual activity: Not Currently    Comment: lives with  Daughter, grandson and his family. no dietary restricitons.  Other Topics Concern   Not on file  Social History Narrative   Not on file   Social Determinants of Health   Financial Resource Strain: Not on file  Food Insecurity: Not on file  Transportation Needs: Not on file  Physical Activity: Not on file  Stress: Not on file  Social Connections: Not on file     Family History: The patient's family history includes Allergies in her daughter; COPD in her daughter, daughter, and father; CVA in her mother; Diabetes in her mother; Heart disease in her maternal aunt and maternal uncle; Rheum arthritis in her father; Stroke in her daughter, maternal grandfather, and mother. There is no history of Colon cancer, Colon polyps, Esophageal cancer, Gallbladder disease, Kidney disease, Heart attack, or Hypertension.  ROS:   Review of Systems  Constitutional:  Negative for chills, diaphoresis and fever.  HENT:  Negative for congestion, nosebleeds and sore throat.   Eyes:  Negative for discharge.  Respiratory:  Negative for cough and shortness of breath.   Cardiovascular:  Positive for  leg swelling. Negative for chest pain, palpitations, orthopnea, claudication and PND.  Gastrointestinal:  Positive for blood in stool (Recent; not sure if recurring). Negative for abdominal pain, diarrhea, heartburn, nausea and vomiting.  Genitourinary:  Negative for dysuria, hematuria and urgency.  Musculoskeletal:  Negative for falls.  Neurological:  Negative for dizziness and seizures.  Endo/Heme/Allergies:  Negative for environmental allergies.  Psychiatric/Behavioral:  Negative for depression and hallucinations.      EKGs/Labs/Other Studies Reviewed:    The following studies were reviewed today:  Bilateral LE Venous Doppler 03/22/2021: IMPRESSION: No evidence of deep venous thrombosis in either lower extremity.  CT Abdomen/Pelvis 08/17/2020: IMPRESSION: Findings suspicious for mild diffuse colitis. No evidence of abscess or other complication.   Colonic diverticulosis, without radiographic evidence of diverticulitis.   Stable large diverticulum arising from the lower thoracic esophagus.   Aortic Atherosclerosis (ICD10-I70.0).  Monitor 09/2018 Study Highlights   Persistent atrial flutter with variable block with minimum HR 45, maximum HR 136 and average HR 65 BPM. 1 episode of ventricular tachycardia lasting 8 beats.   Persistent atrial flutter with variable block with minimum HR 45, maximum HR 136 and average HR 65 BPM. Continue current medical management.      Echo 2018 Study Conclusions   - Left ventricle: The cavity size was normal. There was mild    concentric hypertrophy. Systolic function was normal. The    estimated ejection fraction was in the range of 55% to 60%. Wall    motion was normal; there were no regional wall motion    abnormalities. The study is not technically sufficient to allow    evaluation of LV diastolic function.  - Aortic valve: Transvalvular velocity was within the normal range.    There was no stenosis. There was no regurgitation.  -  Mitral valve: Transvalvular velocity was within the normal range.    There was no evidence for stenosis. There was mild regurgitation.  - Left atrium: The atrium was moderately dilated.  - Right ventricle: Systolic function was normal.  - Atrial septum: No defect or patent foramen ovale was identified.  - Tricuspid valve: There was mild regurgitation.  - Pulmonary arteries: Systolic pressure was within the normal    range. PA peak pressure: 36 mm Hg (S).     EKGs    EKG is personally reviewed. 10/13/21, EKG was not ordered. 09/18/21 (ED), NSR at  77 bpm, Nonspecific ST and T wave abnormality  04/20/2021: Atrial fibrillation. Rate 71 bpm. Anterior Q waves. 05/18/20: EKG was not ordered.   Recent Labs: 10/24/2020: Magnesium 1.8 03/24/2021: Pro B Natriuretic peptide (BNP) 178.0 09/18/2021: TSH 4.34 10/06/2021: ALT 8; BUN 32; Creatinine 1.34; Hemoglobin 8.4; Platelet Count 262; Potassium 4.0; Sodium 138   Recent Lipid Panel    Component Value Date/Time   CHOL 112 09/18/2021 1159   TRIG 77.0 09/18/2021 1159   HDL 41.40 09/18/2021 1159   CHOLHDL 3 09/18/2021 1159   VLDL 15.4 09/18/2021 1159   LDLCALC 55 09/18/2021 1159   LDLCALC 72 10/29/2019 1358   LDLDIRECT 72.1 01/19/2009 1026     Physical Exam:    VS:  BP 128/78   Pulse 64   Ht '4\' 11"'$  (1.499 m)   Wt 86 lb (39 kg)   SpO2 95%   BMI 17.37 kg/m     Wt Readings from Last 3 Encounters:  10/13/21 86 lb (39 kg)  10/06/21 85 lb 1.9 oz (38.6 kg)  09/25/21 86 lb (39 kg)     GEN: Elderly, comfortable, NAD HEENT: Normal NECK: No JVD; No carotid bruits CARDIAC: Irregular, 2/6 systolic murmur, No rubs, No gallops RESPIRATORY:  Clear to auscultation without rales, wheezing or rhonchi  ABDOMEN: Soft, non-tender, non-distended MUSCULOSKELETAL:  No edema; No deformity  SKIN: Warm and dry NEUROLOGIC:  Alert and oriented x 3 PSYCHIATRIC:  Normal affect   ASSESSMENT:    1. Essential hypertension   2. Permanent atrial fibrillation  ([place])   3. MITRAL REGURGITATION, MILD   4. Hyperlipidemia, mixed   5. Mild mitral regurgitation   6. Iron deficiency anemia due to chronic blood loss      PLAN:    In order of problems listed above:  #Recent GIB: #Iron Deficiency Anemia: Patient with recent admission for hemoglobin 5.2. Received 2 units pRBCs and was managed conservatively. Had follow-up with oncology and was continued on oral iron and received IV iron as well. We discussed the risks vs benefits of stopping the apixaban as it is completely reasonable at her age. She would like to continue it for now, but will likely stop it if any signs of re-bleed. -Management per heme -She would like to continue the apixaban for now but likely will stop if any evidence of re-bleed which is completely reasonable.   #Permanent Afib: #Paroxysmal Aflutter: CHADs-vasc 5. On eliquis and low dose metop. She would like to continue apixaban for now but will plan to stop if re-bleed -Continue apixaban 2.'5mg'$  BID per patient preference; will plan to stop it if any signs of rebleed -Continue metop '25mg'$  XL daily  #HTN: Controlled and at goal. No episodes of orthostasis. -Continue losartan '25mg'$  daily -Continue metop '25mg'$  XL daily -Continue imdur '30mg'$  daily  #HLD: -Not on statin; given age, will not re-check lipids  #Mild MR: Asymptomatic. No further monitoring indicated unless clinical change.  Follow-up: 6 months.  Medication Adjustments/Labs and Tests Ordered: Current medicines are reviewed at length with the patient today.  Concerns regarding medicines are outlined above.   No orders of the defined types were placed in this encounter.  No orders of the defined types were placed in this encounter.  Patient Instructions  Medication Instructions:   Your physician recommends that you continue on your current medications as directed. Please refer to the Current Medication list given to you today.  *If you need a refill on your  cardiac medications before your next appointment, please  call your pharmacy*    Follow-Up: At [facility], you and your health needs are our priority.  As part of our continuing mission to provide you with exceptional heart care, we have created designated Provider Care Teams.  These Care Teams include your primary Cardiologist (physician) and Advanced Practice Providers (APPs -  Physician Assistants and Nurse Practitioners) who all work together to provide you with the care you need, when you need it.  We recommend signing up for the patient portal called "MyChart".  Sign up information is provided on this After Visit Summary.  MyChart is used to connect with patients for Virtual Visits (Telemedicine).  Patients are able to view lab/test results, encounter notes, upcoming appointments, etc.  Non-urgent messages can be sent to your provider as well.   To learn more about what you can do with MyChart, go to [email or web address].    Your next appointment:   6 month(s)  The format for your next appointment:   In Person  Provider:   [name], MD     Important Information About Sugar        I,Mathew Stumpf,acting as a scribe for [name], MD.,have documented all relevant documentation on the behalf of [name], MD,as directed by  [name], MD while in the presence of [name], MD.  I, [name], MD, have reviewed all documentation for this visit. The documentation on 10/13/21 for the exam, diagnosis, procedures, and orders are all accurate and complete.  Signed, [name], MD  10/13/2021 11:28 AM    [place]

## 2021-10-18 ENCOUNTER — Inpatient Hospital Stay: Payer: Medicare Other

## 2021-10-18 VITALS — BP 151/61 | HR 81 | Temp 97.6°F | Resp 19

## 2021-10-18 DIAGNOSIS — N289 Disorder of kidney and ureter, unspecified: Secondary | ICD-10-CM | POA: Diagnosis not present

## 2021-10-18 DIAGNOSIS — D500 Iron deficiency anemia secondary to blood loss (chronic): Secondary | ICD-10-CM | POA: Diagnosis not present

## 2021-10-18 DIAGNOSIS — I4819 Other persistent atrial fibrillation: Secondary | ICD-10-CM | POA: Diagnosis not present

## 2021-10-18 DIAGNOSIS — Z7901 Long term (current) use of anticoagulants: Secondary | ICD-10-CM

## 2021-10-18 DIAGNOSIS — Z79899 Other long term (current) drug therapy: Secondary | ICD-10-CM | POA: Diagnosis not present

## 2021-10-18 DIAGNOSIS — K922 Gastrointestinal hemorrhage, unspecified: Secondary | ICD-10-CM | POA: Diagnosis not present

## 2021-10-18 MED ORDER — DARBEPOETIN ALFA 300 MCG/0.6ML IJ SOSY
300.0000 ug | PREFILLED_SYRINGE | Freq: Once | INTRAMUSCULAR | Status: AC
  Administered 2021-10-18: 300 ug via SUBCUTANEOUS
  Filled 2021-10-18: qty 0.6

## 2021-10-18 NOTE — Patient Instructions (Signed)

## 2021-10-19 ENCOUNTER — Inpatient Hospital Stay: Payer: Medicare Other

## 2021-10-19 VITALS — BP 145/53 | HR 62 | Temp 98.0°F | Resp 16

## 2021-10-19 DIAGNOSIS — N289 Disorder of kidney and ureter, unspecified: Secondary | ICD-10-CM | POA: Diagnosis not present

## 2021-10-19 DIAGNOSIS — I4819 Other persistent atrial fibrillation: Secondary | ICD-10-CM | POA: Diagnosis not present

## 2021-10-19 DIAGNOSIS — Z79899 Other long term (current) drug therapy: Secondary | ICD-10-CM | POA: Diagnosis not present

## 2021-10-19 DIAGNOSIS — Z7901 Long term (current) use of anticoagulants: Secondary | ICD-10-CM | POA: Diagnosis not present

## 2021-10-19 DIAGNOSIS — K922 Gastrointestinal hemorrhage, unspecified: Secondary | ICD-10-CM | POA: Diagnosis not present

## 2021-10-19 DIAGNOSIS — D500 Iron deficiency anemia secondary to blood loss (chronic): Secondary | ICD-10-CM | POA: Diagnosis not present

## 2021-10-19 MED ORDER — SODIUM CHLORIDE 0.9 % IV SOLN: Freq: Once | INTRAVENOUS | Status: AC

## 2021-10-19 MED ORDER — SODIUM CHLORIDE 0.9 % IV SOLN
200.0000 mg | Freq: Once | INTRAVENOUS | Status: AC
  Administered 2021-10-19: 200 mg via INTRAVENOUS
  Filled 2021-10-19: qty 200

## 2021-10-19 NOTE — Patient Instructions (Signed)

## 2021-10-23 DIAGNOSIS — D492 Neoplasm of unspecified behavior of bone, soft tissue, and skin: Secondary | ICD-10-CM | POA: Diagnosis not present

## 2021-10-23 DIAGNOSIS — C44722 Squamous cell carcinoma of skin of right lower limb, including hip: Secondary | ICD-10-CM | POA: Diagnosis not present

## 2021-10-23 DIAGNOSIS — D1801 Hemangioma of skin and subcutaneous tissue: Secondary | ICD-10-CM | POA: Diagnosis not present

## 2021-10-26 ENCOUNTER — Inpatient Hospital Stay: Payer: Medicare Other

## 2021-10-26 VITALS — BP 134/45 | HR 63 | Temp 97.7°F | Resp 17

## 2021-10-26 DIAGNOSIS — Z79899 Other long term (current) drug therapy: Secondary | ICD-10-CM | POA: Diagnosis not present

## 2021-10-26 DIAGNOSIS — Z7901 Long term (current) use of anticoagulants: Secondary | ICD-10-CM

## 2021-10-26 DIAGNOSIS — N289 Disorder of kidney and ureter, unspecified: Secondary | ICD-10-CM | POA: Diagnosis not present

## 2021-10-26 DIAGNOSIS — I4819 Other persistent atrial fibrillation: Secondary | ICD-10-CM | POA: Diagnosis not present

## 2021-10-26 DIAGNOSIS — K922 Gastrointestinal hemorrhage, unspecified: Secondary | ICD-10-CM | POA: Diagnosis not present

## 2021-10-26 DIAGNOSIS — D500 Iron deficiency anemia secondary to blood loss (chronic): Secondary | ICD-10-CM | POA: Diagnosis not present

## 2021-10-26 MED ORDER — SODIUM CHLORIDE 0.9 % IV SOLN: Freq: Once | INTRAVENOUS | Status: AC

## 2021-10-26 MED ORDER — SODIUM CHLORIDE 0.9 % IV SOLN
200.0000 mg | Freq: Once | INTRAVENOUS | Status: AC
  Administered 2021-10-26: 200 mg via INTRAVENOUS
  Filled 2021-10-26: qty 200

## 2021-10-26 NOTE — Patient Instructions (Signed)

## 2021-10-30 ENCOUNTER — Telehealth: Payer: Self-pay | Admitting: Family Medicine

## 2021-10-30 NOTE — Telephone Encounter (Signed)
Pt's daughter called and said pt was originally referred to [place] after her stay at the hospital. Since then she has also met with a hematologist and they started with blood transfusions. They cannot remember if it was pcp or hematologist that said the appt with GI may not be necessary as she cannot get a colonoscopy done at her age. Please advise pt if she need a GI appt.

## 2021-10-31 NOTE — Telephone Encounter (Signed)
Called pt was advised and stated understand that's  What they wanted to know.

## 2021-11-01 DIAGNOSIS — L97909 Non-pressure chronic ulcer of unspecified part of unspecified lower leg with unspecified severity: Secondary | ICD-10-CM | POA: Diagnosis not present

## 2021-11-03 ENCOUNTER — Inpatient Hospital Stay: Payer: Medicare Other | Attending: Hematology & Oncology

## 2021-11-03 ENCOUNTER — Inpatient Hospital Stay (HOSPITAL_BASED_OUTPATIENT_CLINIC_OR_DEPARTMENT_OTHER): Payer: Medicare Other | Admitting: Family

## 2021-11-03 ENCOUNTER — Encounter: Payer: Self-pay | Admitting: Family

## 2021-11-03 ENCOUNTER — Inpatient Hospital Stay: Payer: Medicare Other

## 2021-11-03 VITALS — BP 166/49 | HR 65 | Temp 97.7°F | Resp 18 | Ht <= 58 in | Wt 87.2 lb

## 2021-11-03 DIAGNOSIS — N1831 Chronic kidney disease, stage 3a: Secondary | ICD-10-CM | POA: Insufficient documentation

## 2021-11-03 DIAGNOSIS — Z7901 Long term (current) use of anticoagulants: Secondary | ICD-10-CM

## 2021-11-03 DIAGNOSIS — D631 Anemia in chronic kidney disease: Secondary | ICD-10-CM | POA: Diagnosis not present

## 2021-11-03 DIAGNOSIS — D500 Iron deficiency anemia secondary to blood loss (chronic): Secondary | ICD-10-CM

## 2021-11-03 LAB — CBC WITH DIFFERENTIAL (CANCER CENTER ONLY)
Abs Immature Granulocytes: 0.01 10*3/uL (ref 0.00–0.07)
Basophils Absolute: 0 10*3/uL (ref 0.0–0.1)
Basophils Relative: 1 %
Eosinophils Absolute: 0 10*3/uL (ref 0.0–0.5)
Eosinophils Relative: 1 %
HCT: 31.3 % — ABNORMAL LOW (ref 36.0–46.0)
Hemoglobin: 9.6 g/dL — ABNORMAL LOW (ref 12.0–15.0)
Immature Granulocytes: 0 %
Lymphocytes Relative: 18 %
Lymphs Abs: 1.1 10*3/uL (ref 0.7–4.0)
MCH: 28.5 pg (ref 26.0–34.0)
MCHC: 30.7 g/dL (ref 30.0–36.0)
MCV: 92.9 fL (ref 80.0–100.0)
Monocytes Absolute: 0.4 10*3/uL (ref 0.1–1.0)
Monocytes Relative: 7 %
Neutro Abs: 4.4 10*3/uL (ref 1.7–7.7)
Neutrophils Relative %: 73 %
Platelet Count: 238 10*3/uL (ref 150–400)
RBC: 3.37 MIL/uL — ABNORMAL LOW (ref 3.87–5.11)
RDW: 26.8 % — ABNORMAL HIGH (ref 11.5–15.5)
WBC Count: 6 10*3/uL (ref 4.0–10.5)
nRBC: 0 % (ref 0.0–0.2)

## 2021-11-03 LAB — RETICULOCYTES
Immature Retic Fract: 18.2 % — ABNORMAL HIGH (ref 2.3–15.9)
RBC.: 3.39 MIL/uL — ABNORMAL LOW (ref 3.87–5.11)
Retic Count, Absolute: 103.1 10*3/uL (ref 19.0–186.0)
Retic Ct Pct: 3 % (ref 0.4–3.1)

## 2021-11-03 LAB — CMP (CANCER CENTER ONLY)
ALT: 8 U/L (ref 0–44)
AST: 13 U/L — ABNORMAL LOW (ref 15–41)
Albumin: 3.4 g/dL — ABNORMAL LOW (ref 3.5–5.0)
Alkaline Phosphatase: 15 U/L — ABNORMAL LOW (ref 38–126)
Anion gap: 6 (ref 5–15)
BUN: 24 mg/dL — ABNORMAL HIGH (ref 8–23)
CO2: 30 mmol/L (ref 22–32)
Calcium: 9.2 mg/dL (ref 8.9–10.3)
Chloride: 102 mmol/L (ref 98–111)
Creatinine: 1.38 mg/dL — ABNORMAL HIGH (ref 0.44–1.00)
GFR, Estimated: 35 mL/min — ABNORMAL LOW (ref >60)
Glucose, Bld: 109 mg/dL — ABNORMAL HIGH (ref 70–99)
Potassium: 4.2 mmol/L (ref 3.5–5.1)
Sodium: 138 mmol/L (ref 135–145)
Total Bilirubin: 0.3 mg/dL (ref 0.3–1.2)
Total Protein: 7.1 g/dL (ref 6.5–8.1)

## 2021-11-03 LAB — IRON AND IRON BINDING CAPACITY (CC-WL,HP ONLY)
Iron: 45 ug/dL (ref 28–170)
Saturation Ratios: 19 % (ref 10.4–31.8)
TIBC: 241 ug/dL — ABNORMAL LOW (ref 250–450)
UIBC: 196 ug/dL (ref 148–442)

## 2021-11-03 LAB — FERRITIN: Ferritin: 145 ng/mL (ref 11–307)

## 2021-11-03 MED ORDER — DARBEPOETIN ALFA 300 MCG/0.6ML IJ SOSY
300.0000 ug | PREFILLED_SYRINGE | Freq: Once | INTRAMUSCULAR | Status: AC
  Administered 2021-11-03: 300 ug via SUBCUTANEOUS
  Filled 2021-11-03: qty 0.6

## 2021-11-03 NOTE — Patient Instructions (Signed)

## 2021-11-03 NOTE — Progress Notes (Signed)
Hematology and Oncology Follow Up Visit  [name] [name] [id number] Nov 28, 1924 88 y.o. 11/03/2021   Principle Diagnosis:  Iron deficiency anemia  Erythropoietin deficiency anemia   Current Therapy:   IV iron as indicated  Aranesp 300 mcg SQ to maintain Hgb > 11   Interim History:  [name] [name] is here today with her daughter for follow-up and injection. Hgb is 9.6.  She is feeling good right now and notes improvement in her energy and mobility. She still has dark tarry stools regularly. Her appointment with GI for further eval in on 11/06/2021.  No other blood loss noted. No abnormal bruising, no petechiae.  No fever, chills, n/v, cough, rash, dizziness, SOB, chest pain, palpitations, abdominal pain or changes in bowel or bladder habits.  No numbness or tingling in her extremities at this time.  She has mild puffiness in her feet that waxes and wanes.  No falls or syncope reported. She ambulates with a Rolator for added support.  Appetite and hydration are good. Weight is stable at 87 lbs.    ECOG Performance Status: 1 - Symptomatic but completely ambulatory  Medications:  Allergies as of 11/03/2021       Reactions   Amlodipine Shortness Of Breath   Darifenacin Hydrobromide Other (See Comments)   dizziness   Other Other (See Comments)   dizziness   Sulfonamide Derivatives Other (See Comments)   dizziness   Tramadol Other (See Comments)   Insomnia, anorexia        Medication List        Accurate as of November 03, 2021  1:32 PM. If you have any questions, ask your nurse or doctor.          ALPRAZolam 0.25 MG tablet Commonly known as: XANAX TAKE 0.5-1 TABLETS (0.125-0.25 MG TOTAL) BY MOUTH 2 (TWO) TIMES DAILY AS NEEDED FOR ANXIETY.   cefdinir 300 MG capsule Commonly known as: OMNICEF Take 300 mg by mouth 2 (two) times daily.   Eliquis 2.5 MG Tabs tablet Generic drug: apixaban TAKE 1 TABLET BY MOUTH TWICE A DAY   famotidine 20 MG tablet Commonly known as:  PEPCID Take 20 mg by mouth at bedtime.   furosemide 20 MG tablet Commonly known as: LASIX Take 1 tablet (20 mg total) by mouth as needed for fluid or edema (swelling or weight gain of 2lbs overnight or 5lbs in a week).   Hemocyte-F 324-1 MG Tabs Generic drug: Ferrous Fumarate-Folic Acid Take 1 tablet by mouth daily.   isosorbide mononitrate 30 MG 24 hr tablet Commonly known as: IMDUR Take 1 tablet (30 mg total) by mouth daily.   levocetirizine 5 MG tablet Commonly known as: XYZAL Take 2.5 mg by mouth daily at 12 noon.   losartan 25 MG tablet Commonly known as: COZAAR Take 1 tablet (25 mg total) by mouth daily.   metoprolol succinate 25 MG 24 hr tablet Commonly known as: TOPROL-XL TAKE 1 TABLET (25 MG TOTAL) BY MOUTH DAILY.   pantoprazole 40 MG tablet Commonly known as: PROTONIX Take 1 tablet (40 mg total) by mouth daily.   Vitamin D 50 MCG (2000 UT) Caps Take 2,000 Units by mouth every other day.        Allergies:  Allergies  Allergen Reactions   Amlodipine Shortness Of Breath   Darifenacin Hydrobromide Other (See Comments)    dizziness   Other Other (See Comments)    dizziness   Sulfonamide Derivatives Other (See Comments)    dizziness   Tramadol Other (  See Comments)    Insomnia, anorexia    Past Medical History, Surgical history, Social history, and Family History were reviewed and updated.  Review of Systems: All other 10 point review of systems is negative.   Physical Exam:  height is '4\' 10"'$  (1.473 m) and weight is 87 lb 4 oz (39.6 kg). Her oral temperature is 97.7 F (36.5 C). Her blood pressure is 166/49 (abnormal) and her pulse is 65. Her respiration is 18 and oxygen saturation is 98%.   Wt Readings from Last 3 Encounters:  11/03/21 87 lb 4 oz (39.6 kg)  10/13/21 86 lb (39 kg)  10/06/21 85 lb 1.9 oz (38.6 kg)    Ocular: Sclerae unicteric, pupils equal, round and reactive to light Ear-nose-throat: Oropharynx clear, dentition fair Lymphatic:  No cervical or supraclavicular adenopathy Lungs no rales or rhonchi, good excursion bilaterally Heart regular rate and rhythm, no murmur appreciated Abd soft, nontender, positive bowel sounds MSK no focal spinal tenderness, no joint edema Neuro: non-focal, well-oriented, appropriate affect Breasts: Deferred   Lab Results  Component Value Date   WBC 6.0 11/03/2021   HGB 9.6 (L) 11/03/2021   HCT 31.3 (L) 11/03/2021   MCV 92.9 11/03/2021   PLT 238 11/03/2021   Lab Results  Component Value Date   FERRITIN 18 10/06/2021   IRON 32 10/06/2021   TIBC 393 10/06/2021   UIBC 361 10/06/2021   IRONPCTSAT 8 (L) 10/06/2021   Lab Results  Component Value Date   RETICCTPCT 3.0 11/03/2021   RBC 3.39 (L) 11/03/2021   No results found for: "KPAFRELGTCHN", "LAMBDASER", "KAPLAMBRATIO" No results found for: "IGGSERUM", "IGA", "IGMSERUM" No results found for: "TOTALPROTELP", "ALBUMINELP", "A1GS", "A2GS", "BETS", "BETA2SER", "GAMS", "MSPIKE", "SPEI"   Chemistry      Component Value Date/Time   NA 138 10/06/2021 1351   NA 139 04/11/2021 1312   K 4.0 10/06/2021 1351   CL 104 10/06/2021 1351   CO2 26 10/06/2021 1351   BUN 32 (H) 10/06/2021 1351   BUN 30 04/11/2021 1312   CREATININE 1.34 (H) 10/06/2021 1351   CREATININE 1.15 (H) 10/29/2019 1358      Component Value Date/Time   CALCIUM 9.3 10/06/2021 1351   ALKPHOS 14 (L) 10/06/2021 1351   AST 13 (L) 10/06/2021 1351   ALT 8 10/06/2021 1351   BILITOT 0.3 10/06/2021 1351       Impression and Plan: [name] [name] is a very pleasant 88 yo caucasian female with multifactorial anemia (IDA and erythropoietin deficiency).,  ESA given for Hgb 9.6.  Iron studies are pending. We will replace if needed.  GI appointment on 11/6.  Lab and injection only in 3 weeks, follow-up in 6 weeks.   Lottie Dawson, NP 11/3/20231:32 PM

## 2021-11-06 ENCOUNTER — Ambulatory Visit: Payer: Medicare Other | Admitting: Gastroenterology

## 2021-11-06 ENCOUNTER — Encounter: Payer: Self-pay | Admitting: *Deleted

## 2021-11-06 ENCOUNTER — Encounter: Payer: Self-pay | Admitting: Gastroenterology

## 2021-11-06 VITALS — BP 122/68 | HR 64 | Ht 59.0 in | Wt 88.0 lb

## 2021-11-06 DIAGNOSIS — D500 Iron deficiency anemia secondary to blood loss (chronic): Secondary | ICD-10-CM | POA: Diagnosis not present

## 2021-11-06 DIAGNOSIS — Q396 Congenital diverticulum of esophagus: Secondary | ICD-10-CM

## 2021-11-06 DIAGNOSIS — N816 Rectocele: Secondary | ICD-10-CM

## 2021-11-06 NOTE — Patient Instructions (Addendum)
_______________________________________________________  If you are age 86 or older, your body mass index should be between 23-30. Your Body mass index is 17.77 kg/m. If this is out of the aforementioned range listed, please consider follow up with your Primary Care Provider.  If you are age 29 or younger, your body mass index should be between 19-25. Your Body mass index is 17.77 kg/m. If this is out of the aformentioned range listed, please consider follow up with your Primary Care Provider.   ________________________________________________________  The [place] providers would like to encourage you to use [facility] to communicate with providers for non-urgent requests or questions.  Due to long hold times on the telephone, sending your provider a message by [facility] may be a faster and more efficient way to get a response.  Please allow 48 business hours for a response.  Please remember that this is for non-urgent requests.  _______________________________________________________  Keep follow up with your hematologist.   It was a pleasure to see you today!  Thank you for trusting me with your gastrointestinal care!

## 2021-11-06 NOTE — Progress Notes (Signed)
[facility] Consult Note:  History: [name] [name] 11/06/2021  Referring provider: Mosie Lukes, MD  Reason for consult/chief complaint: Blood In Stools   Subjective  HPI:  [name] [name] is here for follow-up after recent hospitalization. I last saw [name] [name] in 2020 when she was evaluated for dysphagia and weight loss and found to have a massive distal duodenal diverticulum dropping a large amount of solid and liquid material.  At 88 years old then, I did not recommend surgical management. She was [name] after routine labs found her to have a severe iron deficiency anemia with a hemoglobin of 5.5.  She was transfused and seen in consultation by my partner Dr. Tarri Glenn, reportedly heme positive though without localizing symptoms beyond her chronic stable dysphagia.  No endoscopic work-up was undertaken.  She saw hematology and received an iron infusion 10/12/2021 and an EPO injection 3 days   She has a BM about every other day, which is typical for her.  She is able to eat and drink well and maintain her nutrition.  She has to be cautious that things are cut and chewed well or food may feel stuck in the lower chest, and this has been a stable symptom for years. [name] [name] has periodically seen bright red blood on the paper in the toilet bowl including this morning.  She also notes "I have a rectocele".  Her daughter, who was in attendance today, was unaware of that.  ago. ROS:  Review of Systems  No chest pain. Dyspnea and fatigue improving since hospitalization with transfusion.  Past Medical History: Past Medical History:  Diagnosis Date   Anemia    Anxiety    Arthritis    hips, knees   Current use of long term anticoagulation    Diverticulitis of colon 02/06/2015   Gait difficulty    GERD (gastroesophageal reflux disease)    Headache(784.0) 06/22/2012   Hyperlipidemia    Hypertension    IBS (irritable bowel syndrome)    Incontinence     Loss of weight 06/05/2015   Medicare annual wellness visit, subsequent 02/05/2014   Melena    Mild mitral regurgitation    Mild tricuspid regurgitation    Mitral and aortic regurgitation    Mitral valve prolapse    hx of - not seen on recent echoes   Osteopenia    Paroxysmal atrial flutter ([place])    Pedal edema 10/01/2016   Persistent atrial fibrillation (HCC)    Personal history of colonic polyps    Renal lithiasis 06/26/2015   Sinus bradycardia    Thyroid disease    hypo   Vitamin B12 deficiency 03/16/2016   Vitamin D deficiency      Past Surgical History: Past Surgical History:  Procedure Laterality Date   BOTOX INJECTION N/A 09/03/2012   Procedure: BOTOX INJECTION;  Surgeon: Inda Castle, MD;  Location: [facility];  Service: Endoscopy;  Laterality: N/A;   BREAST BIOPSY Left    BREAST LUMPECTOMY Right 08/18/2014   Procedure: RIGHT BREAST LUMPECTOMY;  Surgeon: Coralie Keens, MD;  Location: [place];  Service: General;  Laterality: Right;   CARDIAC ELECTROPHYSIOLOGY MAPPING AND ABLATION     CATARACT EXTRACTION, BILATERAL     ESOPHAGOGASTRODUODENOSCOPY N/A 09/03/2012   Procedure: ESOPHAGOGASTRODUODENOSCOPY (EGD);  Surgeon: Inda Castle, MD;  Location: [facility];  Service: Endoscopy;  Laterality: N/A;   ESOPHAGOGASTRODUODENOSCOPY (EGD) WITH PROPOFOL N/A 12/02/2018   Procedure: ESOPHAGOGASTRODUODENOSCOPY (EGD) WITH PROPOFOL;  Surgeon: Loletha Carrow,  Kirke Corin, MD;  Location: [facility];  Service: Gastroenterology;  Laterality: N/A;   ESOPHAGOSCOPY W/ BOTOX INJECTION     I & D EXTREMITY Right 11/06/2012   Procedure: IRRIGATION AND DEBRIDEMENT EXTREMITY Right Ring Finger;  Surgeon: Tennis Must, MD;  Location: [facility];  Service: Orthopedics;  Laterality: Right;   PARTIAL HYSTERECTOMY     ovaries left in place   skin cancer removal       Family History: Family History  Problem Relation Age of Onset   Diabetes Mother    CVA Mother    Stroke Mother    COPD  Father    Rheum arthritis Father    Allergies Daughter    COPD Daughter    Stroke Daughter    COPD Daughter        previous smoker   Stroke Maternal Grandfather    Heart disease Maternal Aunt    Heart disease Maternal Uncle    Colon cancer Neg Hx    Colon polyps Neg Hx    Esophageal cancer Neg Hx    Gallbladder disease Neg Hx    Kidney disease Neg Hx    Heart attack Neg Hx    Hypertension Neg Hx     Social History: Social History   Socioeconomic History   Marital status: Widowed    Spouse name: Not on file   Number of children: 5   Years of education: Not on file   Highest education level: Not on file  Occupational History   Occupation: retired    Fish farm manager: RETIRED  Tobacco Use   Smoking status: Never   Smokeless tobacco: Never  Vaping Use   Vaping Use: Never used  Substance and Sexual Activity   Alcohol use: No   Drug use: No   Sexual activity: Not Currently    Comment: lives with Daughter, grandson and his family. no dietary restricitons.  Other Topics Concern   Not on file  Social History Narrative   Not on file   Social Determinants of Health   Financial Resource Strain: Not on file  Food Insecurity: Not on file  Transportation Needs: Not on file  Physical Activity: Not on file  Stress: Not on file  Social Connections: Not on file    Allergies: Allergies  Allergen Reactions   Amlodipine Shortness Of Breath   Darifenacin Hydrobromide Other (See Comments)    dizziness   Other Other (See Comments)    dizziness   Sulfonamide Derivatives Other (See Comments)    dizziness   Tramadol Other (See Comments)    Insomnia, anorexia    Outpatient Meds: Current Outpatient Medications  Medication Sig Dispense Refill   ALPRAZolam (XANAX) 0.25 MG tablet TAKE 0.5-1 TABLETS (0.125-0.25 MG TOTAL) BY MOUTH 2 (TWO) TIMES DAILY AS NEEDED FOR ANXIETY. 30 tablet 1   apixaban (ELIQUIS) 2.5 MG TABS tablet TAKE 1 TABLET BY MOUTH TWICE A DAY 60 tablet 10   cefdinir  (OMNICEF) 300 MG capsule Take 300 mg by mouth 2 (two) times daily.     Cholecalciferol (VITAMIN D) 2000 UNITS CAPS Take 2,000 Units by mouth every other day.     famotidine (PEPCID) 20 MG tablet Take 20 mg by mouth at bedtime.     Ferrous Fumarate-Folic Acid (HEMOCYTE-F) 324-1 MG TABS Take 1 tablet by mouth daily. 30 tablet 5   isosorbide mononitrate (IMDUR) 30 MG 24 hr tablet Take 1 tablet (30 mg total) by mouth daily. 90 tablet 2   levocetirizine (XYZAL)  5 MG tablet Take 2.5 mg by mouth daily at 12 noon.     losartan (COZAAR) 25 MG tablet Take 1 tablet (25 mg total) by mouth daily. 90 tablet 1   metoprolol succinate (TOPROL-XL) 25 MG 24 hr tablet TAKE 1 TABLET (25 MG TOTAL) BY MOUTH DAILY. 90 tablet 1   pantoprazole (PROTONIX) 40 MG tablet Take 1 tablet (40 mg total) by mouth daily. 90 tablet 1   furosemide (LASIX) 20 MG tablet Take 1 tablet (20 mg total) by mouth as needed for fluid or edema (swelling or weight gain of 2lbs overnight or 5lbs in a week). 90 tablet 3   No current facility-administered medications for this visit.      ___________________________________________________________________ Objective   Exam:  BP 122/68   Pulse 64   Ht '4\' 11"'$  (1.499 m)   Wt 88 lb (39.9 kg)   SpO2 98%   BMI 17.77 kg/m  Wt Readings from Last 3 Encounters:  11/06/21 88 lb (39.9 kg)  11/03/21 87 lb 4 oz (39.6 kg)  10/13/21 86 lb (39 kg)   Daughter present for entire visit. General: Very pleasant, somewhat hard of hearing, thin frail elderly woman.  Able to get on exam table independently. Eyes: sclera anicteric, no redness ENT: oral mucosa moist without lesions, no cervical or supraclavicular lymphadenopathy CV: Irregular with soft systolic murmur, no JVD, no peripheral edema Resp: clear to auscultation bilaterally, normal RR and effort noted GI: soft, no tenderness, with active bowel sounds. No guarding or palpable organomegaly noted. Pale Rectal: Rectocele protruding from vagina, size  of a large egg.  Normal overlying mucosa.  It was reducible but recurs. Normal perianal exam otherwise.  DRE with no palpable mass or other lesion.  The rectocele is clearly felt on exam, and there is some scant fresh blood on the glove.  Labs:     Latest Ref Rng & Units 11/03/2021   12:55 PM 10/06/2021    1:51 PM 10/02/2021   10:11 AM  CBC  WBC 4.0 - 10.5 K/uL 6.0  6.6  6.6   Hemoglobin 12.0 - 15.0 g/dL 9.6  8.4  8.5 Repeated and verified X2.   Hematocrit 36.0 - 46.0 % 31.3  27.9  27.0   Platelets 150 - 400 K/uL 238  262  230.0    Iron/TIBC/Ferritin/ %Sat    Component Value Date/Time   IRON 45 11/03/2021 1255   TIBC 241 (L) 11/03/2021 1255   FERRITIN 145 11/03/2021 1255   IRONPCTSAT 19 11/03/2021 1255   IRONPCTSAT 14 (L) 11/28/2020 0916    Assessment: Encounter Diagnoses  Name Primary?   Iron deficiency anemia due to chronic blood loss Yes   Rectocele    Esophageal diverticulum     I believe her IDA is from chronic occult blood loss due to friable rectal mucosa from a large rectocele. Blood loss is exacerbated by long-term use of anticoagulation for A-fib.  Chronic stable dysphagia from large esophageal diverticulum.  If her anemia and iron deficiency can be kept in a relatively safe range with iron supplementation (oral and as needed IV), then this may be sufficient long-term management.  If that treatment is unable to keep up with the blood loss, then strong consideration should be given to discontinuing her anticoagulation. I would not advocate surgical repair of this rectocele at her age.  Endoscopic procedures are considered to be too high risk for anything other than emergency intervention in this patient.   30 minutes were spent on  this encounter (including chart review, history/exam, counseling/coordination of care, and documentation) > 50% of that time was spent on counseling and coordination of care.   Nelida Meuse III  CC: Referring provider noted above

## 2021-11-17 ENCOUNTER — Other Ambulatory Visit: Payer: Self-pay | Admitting: Family

## 2021-11-27 ENCOUNTER — Inpatient Hospital Stay: Payer: Medicare Other

## 2021-11-27 DIAGNOSIS — D500 Iron deficiency anemia secondary to blood loss (chronic): Secondary | ICD-10-CM

## 2021-11-27 DIAGNOSIS — N1831 Chronic kidney disease, stage 3a: Secondary | ICD-10-CM | POA: Diagnosis not present

## 2021-11-27 DIAGNOSIS — Z7901 Long term (current) use of anticoagulants: Secondary | ICD-10-CM

## 2021-11-27 DIAGNOSIS — D631 Anemia in chronic kidney disease: Secondary | ICD-10-CM

## 2021-11-27 LAB — CBC WITH DIFFERENTIAL (CANCER CENTER ONLY)
Abs Immature Granulocytes: 0.02 10*3/uL (ref 0.00–0.07)
Basophils Absolute: 0 10*3/uL (ref 0.0–0.1)
Basophils Relative: 1 %
Eosinophils Absolute: 0.1 10*3/uL (ref 0.0–0.5)
Eosinophils Relative: 1 %
HCT: 36.8 % (ref 36.0–46.0)
Hemoglobin: 11.3 g/dL — ABNORMAL LOW (ref 12.0–15.0)
Immature Granulocytes: 0 %
Lymphocytes Relative: 25 %
Lymphs Abs: 1.2 10*3/uL (ref 0.7–4.0)
MCH: 29.8 pg (ref 26.0–34.0)
MCHC: 30.7 g/dL (ref 30.0–36.0)
MCV: 97.1 fL (ref 80.0–100.0)
Monocytes Absolute: 0.3 10*3/uL (ref 0.1–1.0)
Monocytes Relative: 6 %
Neutro Abs: 3.3 10*3/uL (ref 1.7–7.7)
Neutrophils Relative %: 67 %
Platelet Count: 205 10*3/uL (ref 150–400)
RBC: 3.79 MIL/uL — ABNORMAL LOW (ref 3.87–5.11)
RDW: 18.3 % — ABNORMAL HIGH (ref 11.5–15.5)
WBC Count: 4.9 10*3/uL (ref 4.0–10.5)
nRBC: 0 % (ref 0.0–0.2)

## 2021-11-27 LAB — CMP (CANCER CENTER ONLY)
ALT: 8 U/L (ref 0–44)
AST: 15 U/L (ref 15–41)
Albumin: 4 g/dL (ref 3.5–5.0)
Alkaline Phosphatase: 15 U/L — ABNORMAL LOW (ref 38–126)
Anion gap: 7 (ref 5–15)
BUN: 19 mg/dL (ref 8–23)
CO2: 30 mmol/L (ref 22–32)
Calcium: 9.8 mg/dL (ref 8.9–10.3)
Chloride: 103 mmol/L (ref 98–111)
Creatinine: 1.05 mg/dL — ABNORMAL HIGH (ref 0.44–1.00)
GFR, Estimated: 49 mL/min — ABNORMAL LOW (ref >60)
Glucose, Bld: 106 mg/dL — ABNORMAL HIGH (ref 70–99)
Potassium: 4.1 mmol/L (ref 3.5–5.1)
Sodium: 140 mmol/L (ref 135–145)
Total Bilirubin: 0.3 mg/dL (ref 0.3–1.2)
Total Protein: 7.3 g/dL (ref 6.5–8.1)

## 2021-12-11 ENCOUNTER — Ambulatory Visit (INDEPENDENT_AMBULATORY_CARE_PROVIDER_SITE_OTHER): Payer: Medicare Other | Admitting: *Deleted

## 2021-12-11 DIAGNOSIS — Z0000 Encounter for general adult medical examination without abnormal findings: Secondary | ICD-10-CM

## 2021-12-11 NOTE — Progress Notes (Signed)
Subjective:   [name] [name] is a 88 y.o. female who presents for Medicare Annual (Subsequent) preventive examination.  I connected with  Gladys Damme on 12/11/21 by a audio enabled telemedicine application and verified that I am speaking with the correct person using two identifiers.  Patient Location: Home  Provider Location: Office/Clinic  I discussed the limitations of evaluation and management by telemedicine. The patient expressed understanding and agreed to proceed.   Review of Systems    Defer to PCP Cardiac Risk Factors include: dyslipidemia;hypertension;advanced age (>81mn, >>17women)     Objective:    There were no vitals filed for this visit. There is no height or weight on file to calculate BMI.     12/11/2021    2:23 PM 11/03/2021    1:13 PM 10/06/2021    2:06 PM 09/19/2021    3:00 AM 03/22/2021   11:43 AM 08/21/2020   11:13 AM 08/17/2020   10:15 AM  Advanced Directives  Does Patient Have a Medical Advance Directive? Yes Yes Yes Yes Yes Yes No  Type of Advance Directive [place]  Does patient want to make changes to medical advance directive?  No - Patient declined  Yes (Inpatient - patient defers changing a medical advance directive at this time - Information given)     Copy of [place]?  No - copy requested       Would patient like information on creating a medical advance directive?  No - Patient declined     No - Patient declined    Current Medications (verified) Outpatient Encounter Medications as of 12/11/2021  Medication Sig   ALPRAZolam (XANAX) 0.25 MG tablet TAKE 0.5-1 TABLETS (0.125-0.25 MG TOTAL) BY MOUTH 2 (TWO) TIMES DAILY AS NEEDED FOR ANXIETY.   apixaban (ELIQUIS) 2.5 MG TABS tablet TAKE 1 TABLET BY MOUTH TWICE A DAY   cefdinir (OMNICEF) 300 MG capsule Take 300 mg by mouth 2 (two) times  daily.   Cholecalciferol (VITAMIN D) 2000 UNITS CAPS Take 2,000 Units by mouth every other day.   famotidine (PEPCID) 20 MG tablet Take 20 mg by mouth at bedtime.   Ferrous Fumarate-Folic Acid (HEMOCYTE-F) 324-1 MG TABS Take 1 tablet by mouth daily.   furosemide (LASIX) 20 MG tablet Take 1 tablet (20 mg total) by mouth as needed for fluid or edema (swelling or weight gain of 2lbs overnight or 5lbs in a week).   isosorbide mononitrate (IMDUR) 30 MG 24 hr tablet Take 1 tablet (30 mg total) by mouth daily.   levocetirizine (XYZAL) 5 MG tablet Take 2.5 mg by mouth daily at 12 noon.   losartan (COZAAR) 25 MG tablet TAKE 1 TABLET (25 MG TOTAL) BY MOUTH DAILY.   metoprolol succinate (TOPROL-XL) 25 MG 24 hr tablet TAKE 1 TABLET (25 MG TOTAL) BY MOUTH DAILY.   pantoprazole (PROTONIX) 40 MG tablet Take 1 tablet (40 mg total) by mouth daily.   No facility-administered encounter medications on file as of 12/11/2021.    Allergies (verified) Amlodipine, Darifenacin hydrobromide, Other, Sulfonamide derivatives, and Tramadol   History: Past Medical History:  Diagnosis Date   Anemia    Anxiety    Arthritis    hips, knees   Current use of long term anticoagulation    Diverticulitis of colon 02/06/2015   Gait difficulty    GERD (gastroesophageal reflux disease)  Headache(784.0) 06/22/2012   Hyperlipidemia    Hypertension    IBS (irritable bowel syndrome)    Incontinence    Loss of weight 06/05/2015   Medicare annual wellness visit, subsequent 02/05/2014   Melena    Mild mitral regurgitation    Mild tricuspid regurgitation    Mitral and aortic regurgitation    Mitral valve prolapse    hx of - not seen on recent echoes   Osteopenia    Paroxysmal atrial flutter ([place])    Pedal edema 10/01/2016   Persistent atrial fibrillation (HCC)    Personal history of colonic polyps    Renal lithiasis 06/26/2015   Sinus bradycardia    Thyroid disease    hypo   Vitamin B12 deficiency 03/16/2016   Vitamin D  deficiency    Past Surgical History:  Procedure Laterality Date   BOTOX INJECTION N/A 09/03/2012   Procedure: BOTOX INJECTION;  Surgeon: Inda Castle, MD;  Location: [facility];  Service: Endoscopy;  Laterality: N/A;   BREAST BIOPSY Left    BREAST LUMPECTOMY Right 08/18/2014   Procedure: RIGHT BREAST LUMPECTOMY;  Surgeon: Coralie Keens, MD;  Location: [facility];  Service: General;  Laterality: Right;   CARDIAC ELECTROPHYSIOLOGY MAPPING AND ABLATION     CATARACT EXTRACTION, BILATERAL     ESOPHAGOGASTRODUODENOSCOPY N/A 09/03/2012   Procedure: ESOPHAGOGASTRODUODENOSCOPY (EGD);  Surgeon: Inda Castle, MD;  Location: [facility];  Service: Endoscopy;  Laterality: N/A;   ESOPHAGOGASTRODUODENOSCOPY (EGD) WITH PROPOFOL N/A 12/02/2018   Procedure: ESOPHAGOGASTRODUODENOSCOPY (EGD) WITH PROPOFOL;  Surgeon: [name], MD;  Location: [facility];  Service: Gastroenterology;  Laterality: N/A;   ESOPHAGOSCOPY W/ BOTOX INJECTION     I & D EXTREMITY Right 11/06/2012   Procedure: IRRIGATION AND DEBRIDEMENT EXTREMITY Right Ring Finger;  Surgeon: Tennis Must, MD;  Location: [facility];  Service: Orthopedics;  Laterality: Right;   PARTIAL HYSTERECTOMY     ovaries left in place   skin cancer removal     Family History  Problem Relation Age of Onset   Diabetes Mother    CVA Mother    Stroke Mother    COPD Father    Rheum arthritis Father    Allergies Daughter    COPD Daughter    Stroke Daughter    COPD Daughter        previous smoker   Stroke Maternal Grandfather    Heart disease Maternal Aunt    Heart disease Maternal Uncle    Colon cancer Neg Hx    Colon polyps Neg Hx    Esophageal cancer Neg Hx    Gallbladder disease Neg Hx    Kidney disease Neg Hx    Heart attack Neg Hx    Hypertension Neg Hx    Social History   Socioeconomic History   Marital status: Widowed    Spouse name: Not on file   Number of children: 5   Years of education: Not on file   Highest  education level: Not on file  Occupational History   Occupation: retired    Fish farm manager: RETIRED  Tobacco Use   Smoking status: Never   Smokeless tobacco: Never  Vaping Use   Vaping Use: Never used  Substance and Sexual Activity   Alcohol use: No   Drug use: No   Sexual activity: Not Currently    Comment: lives with Daughter, grandson and his family. no dietary restricitons.  Other Topics Concern   Not on file  Social History Narrative  Not on file   Social Determinants of Health   Financial Resource Strain: Low Risk  (12/11/2021)   Overall Financial Resource Strain (CARDIA)    Difficulty of Paying Living Expenses: Not hard at all  Food Insecurity: No Food Insecurity (12/11/2021)   Hunger Vital Sign    Worried About Running Out of Food in the Last Year: Never true    Ran Out of Food in the Last Year: Never true  Transportation Needs: No Transportation Needs (12/11/2021)   PRAPARE - Hydrologist (Medical): No    Lack of Transportation (Non-Medical): No  Physical Activity: Inactive (12/11/2021)   Exercise Vital Sign    Days of Exercise per Week: 0 days    Minutes of Exercise per Session: 0 min  Stress: No Stress Concern Present (12/11/2021)   [place]    Feeling of Stress : Not at all  Social Connections: Socially Isolated (12/11/2021)   Social Connection and Isolation Panel [NHANES]    Frequency of Communication with Friends and Family: Once a week    Frequency of Social Gatherings with Friends and Family: Once a week    Attends Religious Services: Never    Marine scientist or Organizations: No    Attends Archivist Meetings: Never    Marital Status: Widowed    Tobacco Counseling Counseling given: Not Answered   Clinical Intake:  Pre-visit preparation completed: Yes  Pain : No/denies pain  Diabetes: No  How often do you need to have someone help you  when you read instructions, pamphlets, or other written materials from your doctor or pharmacy?: 1 - Never  Activities of Daily Living    12/11/2021    2:27 PM 09/19/2021    3:00 AM  In your present state of health, do you have any difficulty performing the following activities:  Hearing? 1 1  Comment wears hearing aid   Vision? 1 0  Difficulty concentrating or making decisions? 0 1  Walking or climbing stairs? 1 1  Dressing or bathing? 0 0  Doing errands, shopping? 1 1  Preparing Food and eating ? N   Using the Toilet? N   In the past six months, have you accidently leaked urine? Y   Do you have problems with loss of bowel control? N   Managing your Medications? N   Managing your Finances? N   Housekeeping or managing your Housekeeping? N     Patient Care Team: Mosie Lukes, MD as PCP - General (Family Medicine) [name], MD as PCP - Cardiology (Cardiology)  Indicate any recent Medical Services you may have received from other than [facility] providers in the past year (date may be approximate).     Assessment:   This is a routine wellness examination for [facility].  Hearing/Vision screen No results found.  Dietary issues and exercise activities discussed: Current Exercise Habits: The patient does not participate in regular exercise at present, Exercise limited by: orthopedic condition(s)   Goals Addressed   None    Depression Screen    12/11/2021    2:26 PM 09/18/2021   11:17 AM 04/14/2021   10:51 AM 03/24/2021    9:42 AM 06/21/2020    2:28 PM 10/29/2019    1:25 PM 06/09/2018   12:10 PM  PHQ 2/9 Scores  PHQ - 2 Score 0 0 0 0 1 0 0  PHQ- 9 Score  3  Fall Risk    12/11/2021    2:23 PM 09/18/2021   11:17 AM 04/14/2021   10:51 AM 03/24/2021    9:42 AM 06/21/2020    2:28 PM  Fall Risk   Falls in the past year? 0 0 0 0 0  Number falls in past yr: 0 0 0 0 0  Injury with Fall? 0 0 0 0 0  Risk for fall due to : Impaired balance/gait   Impaired  balance/gait   Follow up Falls evaluation completed Falls evaluation completed Falls evaluation completed Falls evaluation completed;Falls prevention discussed     FALL RISK PREVENTION PERTAINING TO THE HOME:  Any stairs in or around the home? No  If so, are there any without handrails? No  Home free of loose throw rugs in walkways, pet beds, electrical cords, etc? Yes  Adequate lighting in your home to reduce risk of falls? Yes   ASSISTIVE DEVICES UTILIZED TO PREVENT FALLS:  Life alert? No  Use of a cane, walker or w/c? Yes  Grab bars in the bathroom? Yes  Shower chair or bench in shower? Yes  Elevated toilet seat or a handicapped toilet? No   TIMED UP AND GO:  Was the test performed?  No, audio visit .    Cognitive Function:        12/11/2021    2:30 PM  6CIT Screen  What Year? 0 points  What month? 0 points  What time? 0 points  Count back from 20 2 points  Months in reverse 0 points  Repeat phrase 0 points  Total Score 2 points    Immunizations Immunization History  Administered Date(s) Administered   Fluad Quad(high Dose 65+) 09/06/2018, 10/30/2019, 10/24/2020, 09/18/2021   Influenza Whole 09/18/2007, 10/02/2010, 09/15/2012   Influenza, High Dose Seasonal PF 10/05/2015, 10/01/2016, 10/18/2017   Influenza,inj,Quad PF,6+ Mos 08/24/2013, 08/31/2014   PFIZER(Purple Top)SARS-COV-2 Vaccination 02/11/2019, 03/04/2019   Pfizer Covid-19 Vaccine Bivalent Booster 69yr & up 12/10/2020   Pneumococcal Conjugate-13 08/31/2014   Pneumococcal Polysaccharide-23 02/20/2006, 06/11/2019   Tdap 10/30/2010    TDAP status: Due, Education has been provided regarding the importance of this vaccine. Advised may receive this vaccine at local pharmacy or Health Dept. Aware to provide a copy of the vaccination record if obtained from local pharmacy or Health Dept. Verbalized acceptance and understanding.  Flu Vaccine status: Up to date  Pneumococcal vaccine status: Up to  date  Covid-19 vaccine status: Information provided on how to obtain vaccines.   Qualifies for Shingles Vaccine? Yes   Zostavax completed No   Shingrix Completed?: No.    Education has been provided regarding the importance of this vaccine. Patient has been advised to call insurance company to determine out of pocket expense if they have not yet received this vaccine. Advised may also receive vaccine at local pharmacy or Health Dept. Verbalized acceptance and understanding.  Screening Tests Health Maintenance  Topic Date Due   Zoster Vaccines- Shingrix (1 of 2) Never done   Medicare Annual Wellness (AWV)  02/06/2015   DTaP/Tdap/Td (2 - Td or Tdap) 10/29/2020   COVID-19 Vaccine (4 - 2023-24 season) 09/01/2021   Pneumonia Vaccine 88 Years old  Completed   INFLUENZA VACCINE  Completed   DEXA SCAN  Completed   HPV VACCINES  Aged Out    Health Maintenance  Health Maintenance Due  Topic Date Due   Zoster Vaccines- Shingrix (1 of 2) Never done   MBJ'sWellness (AWV)  02/06/2015  DTaP/Tdap/Td (2 - Td or Tdap) 10/29/2020   COVID-19 Vaccine (4 - 2023-24 season) 09/01/2021    Colorectal cancer screening: No longer required.   Mammogram status: No longer required due to pt preference.  Bone Density status: Pt declines  Lung Cancer Screening: (Low Dose CT Chest recommended if Age 70-80 years, 30 pack-year currently smoking OR have quit w/in 15years.) does not qualify.   Additional Screening:  Hepatitis C Screening: does not qualify  Vision Screening: Recommended annual ophthalmology exams for early detection of glaucoma and other disorders of the eye. Is the patient up to date with their annual eye exam?  No  Who is the provider or what is the name of the office in which the patient attends annual eye exams? N/a If pt is not established with a provider, would they like to be referred to a provider to establish care? No .   Dental Screening: Recommended annual dental  exams for proper oral hygiene  Community Resource Referral / Chronic Care Management: CRR required this visit?  No   CCM required this visit?  No      Plan:     I have personally reviewed and noted the following in the patient's chart:   Medical and social history Use of alcohol, tobacco or illicit drugs  Current medications and supplements including opioid prescriptions. Patient is not currently taking opioid prescriptions. Functional ability and status Nutritional status Physical activity Advanced directives List of other physicians Hospitalizations, surgeries, and ER visits in previous 12 months Vitals Screenings to include cognitive, depression, and falls Referrals and appointments  In addition, I have reviewed and discussed with patient certain preventive protocols, quality metrics, and best practice recommendations. A written personalized care plan for preventive services as well as general preventive health recommendations were provided to patient.   Due to this being a telephonic visit, the after visit summary with patients personalized plan was offered to patient via mail or my-chart.  Patient would like to access on my-chart.  Beatris Ship, [place]   12/11/2021   Nurse Notes: None

## 2021-12-11 NOTE — Patient Instructions (Signed)
[name] , Thank you for taking time to come for your Medicare Wellness Visit. I appreciate your ongoing commitment to your health goals. Please review the following plan we discussed and let me know if I can assist you in the future.   These are the goals we discussed:  Goals      Chronic Care Management Pharmacy Care Plan     CARE PLAN ENTRY (see longitudinal plan of care for additional care plan information)  Current Barriers:  Chronic Disease Management support, education, and care coordination needs related to Afib, Hypertension, GERD, Allergic Rhinitis   Hypertension BP Readings from Last 3 Encounters:  06/11/19 140/78  02/09/19 124/82  12/02/18 (!) 168/68  Pharmacist Clinical Goal(s): Over the next 90 days, patient will work with PharmD and providers to maintain BP goal <140/90 Current regimen:  Ramipril '10mg'$  twice daily AM and HS Hydralazine '10mg'$  twice daily  Intervention: Requested patient to check BP 2-3 times per week and record Patient self care activities - Over the next 90 days, patient will: Check BP 2-3 times per week, document, and provide at future appointments Ensure daily salt intake < 2300 mg/day  Health Maintenance  Pharmacist Clinical Goal(s) Over the next 90 days, patient will work with PharmD and providers to complete health maintenance screenings/vaccinations Interventions: Recommended patient to complete Shingrix Patient self care activities - Over the next 90 days, patient will: Complete Shingrix vaccine series  Medication management Pharmacist Clinical Goal(s): Over the next 90 days, patient will work with PharmD and providers to maintain optimal medication adherence Current pharmacy: [facility] Interventions Comprehensive medication review performed. Continue current medication management strategy Patient self care activities - Over the next 90 days, patient will: Focus on medication adherence by filling and taking medications appropriately   Take medications as prescribed Report any questions or concerns to PharmD and/or provider(s)  Initial goal documentation         This is a list of the screening recommended for you and due dates:  Health Maintenance  Topic Date Due   Zoster (Shingles) Vaccine (1 of 2) Never done   DTaP/Tdap/Td vaccine (2 - Td or Tdap) 10/29/2020   COVID-19 Vaccine (4 - 2023-24 season) 09/01/2021   Medicare Annual Wellness Visit  12/12/2022   Pneumonia Vaccine  Completed   Flu Shot  Completed   DEXA scan (bone density measurement)  Completed   HPV Vaccine  Aged Out     Next appointment: Follow up in one year for your annual wellness visit.   Preventive Care 58 Years and Older, Female Preventive care refers to lifestyle choices and visits with your health care provider that can promote health and wellness. What does preventive care include? A yearly physical exam. This is also called an annual well check. Dental exams once or twice a year. Routine eye exams. Ask your health care provider how often you should have your eyes checked. Personal lifestyle choices, including: Daily care of your teeth and gums. Regular physical activity. Eating a healthy diet. Avoiding tobacco and drug use. Limiting alcohol use. Practicing safe sex. Taking low-dose aspirin every day. Taking vitamin and mineral supplements as recommended by your health care provider. What happens during an annual well check? The services and screenings done by your health care provider during your annual well check will depend on your age, overall health, lifestyle risk factors, and family history of disease. Counseling  Your health care provider may ask you questions about your: Alcohol use. Tobacco use. Drug use.  Emotional well-being. Home and relationship well-being. Sexual activity. Eating habits. History of falls. Memory and ability to understand (cognition). Work and work Statistician. Reproductive health. Screening   You may have the following tests or measurements: Height, weight, and BMI. Blood pressure. Lipid and cholesterol levels. These may be checked every 5 years, or more frequently if you are over 63 years old. Skin check. Lung cancer screening. You may have this screening every year starting at age 68 if you have a 30-pack-year history of smoking and currently smoke or have quit within the past 15 years. Fecal occult blood test (FOBT) of the stool. You may have this test every year starting at age 37. Flexible sigmoidoscopy or colonoscopy. You may have a sigmoidoscopy every 5 years or a colonoscopy every 10 years starting at age 24. Hepatitis C blood test. Hepatitis B blood test. Sexually transmitted disease (STD) testing. Diabetes screening. This is done by checking your blood sugar (glucose) after you have not eaten for a while (fasting). You may have this done every 1-3 years. Bone density scan. This is done to screen for osteoporosis. You may have this done starting at age 41. Mammogram. This may be done every 1-2 years. Talk to your health care provider about how often you should have regular mammograms. Talk with your health care provider about your test results, treatment options, and if necessary, the need for more tests. Vaccines  Your health care provider may recommend certain vaccines, such as: Influenza vaccine. This is recommended every year. Tetanus, diphtheria, and acellular pertussis (Tdap, Td) vaccine. You may need a Td booster every 10 years. Zoster vaccine. You may need this after age 87. Pneumococcal 13-valent conjugate (PCV13) vaccine. One dose is recommended after age 4. Pneumococcal polysaccharide (PPSV23) vaccine. One dose is recommended after age 99. Talk to your health care provider about which screenings and vaccines you need and how often you need them. This information is not intended to replace advice given to you by your health care provider. Make sure you discuss  any questions you have with your health care provider. Document Released: 01/14/2015 Document Revised: 09/07/2015 Document Reviewed: 10/19/2014 Elsevier Interactive Patient Education  2017 [employer] Prevention in the Home Falls can cause injuries. They can happen to people of all ages. There are many things you can do to make your home safe and to help prevent falls. What can I do on the outside of my home? Regularly fix the edges of walkways and driveways and fix any cracks. Remove anything that might make you trip as you walk through a door, such as a raised step or threshold. Trim any bushes or trees on the path to your home. Use bright outdoor lighting. Clear any walking paths of anything that might make someone trip, such as rocks or tools. Regularly check to see if handrails are loose or broken. Make sure that both sides of any steps have handrails. Any raised decks and porches should have guardrails on the edges. Have any leaves, snow, or ice cleared regularly. Use sand or salt on walking paths during winter. Clean up any spills in your garage right away. This includes oil or grease spills. What can I do in the bathroom? Use night lights. Install grab bars by the toilet and in the tub and shower. Do not use towel bars as grab bars. Use non-skid mats or decals in the tub or shower. If you need to sit down in the shower, use a plastic, non-slip stool.  Keep the floor dry. Clean up any water that spills on the floor as soon as it happens. Remove soap buildup in the tub or shower regularly. Attach bath mats securely with double-sided non-slip rug tape. Do not have throw rugs and other things on the floor that can make you trip. What can I do in the bedroom? Use night lights. Make sure that you have a light by your bed that is easy to reach. Do not use any sheets or blankets that are too big for your bed. They should not hang down onto the floor. Have a firm chair that has  side arms. You can use this for support while you get dressed. Do not have throw rugs and other things on the floor that can make you trip. What can I do in the kitchen? Clean up any spills right away. Avoid walking on wet floors. Keep items that you use a lot in easy-to-reach places. If you need to reach something above you, use a strong step stool that has a grab bar. Keep electrical cords out of the way. Do not use floor polish or wax that makes floors slippery. If you must use wax, use non-skid floor wax. Do not have throw rugs and other things on the floor that can make you trip. What can I do with my stairs? Do not leave any items on the stairs. Make sure that there are handrails on both sides of the stairs and use them. Fix handrails that are broken or loose. Make sure that handrails are as long as the stairways. Check any carpeting to make sure that it is firmly attached to the stairs. Fix any carpet that is loose or worn. Avoid having throw rugs at the top or bottom of the stairs. If you do have throw rugs, attach them to the floor with carpet tape. Make sure that you have a light switch at the top of the stairs and the bottom of the stairs. If you do not have them, ask someone to add them for you. What else can I do to help prevent falls? Wear shoes that: Do not have high heels. Have rubber bottoms. Are comfortable and fit you well. Are closed at the toe. Do not wear sandals. If you use a stepladder: Make sure that it is fully opened. Do not climb a closed stepladder. Make sure that both sides of the stepladder are locked into place. Ask someone to hold it for you, if possible. Clearly mark and make sure that you can see: Any grab bars or handrails. First and last steps. Where the edge of each step is. Use tools that help you move around (mobility aids) if they are needed. These include: Canes. Walkers. Scooters. Crutches. Turn on the lights when you go into a dark area.  Replace any light bulbs as soon as they burn out. Set up your furniture so you have a clear path. Avoid moving your furniture around. If any of your floors are uneven, fix them. If there are any pets around you, be aware of where they are. Review your medicines with your doctor. Some medicines can make you feel dizzy. This can increase your chance of falling. Ask your doctor what other things that you can do to help prevent falls. This information is not intended to replace advice given to you by your health care provider. Make sure you discuss any questions you have with your health care provider. Document Released: 10/14/2008 Document Revised: 05/26/2015 Document Reviewed: 01/22/2014  Chartered certified accountant Patient Education  [employer].

## 2021-12-22 ENCOUNTER — Inpatient Hospital Stay: Payer: Medicare Other | Attending: Hematology & Oncology

## 2021-12-22 ENCOUNTER — Inpatient Hospital Stay: Payer: Medicare Other

## 2021-12-22 ENCOUNTER — Inpatient Hospital Stay: Payer: Medicare Other | Admitting: Hematology & Oncology

## 2021-12-22 VITALS — BP 138/52 | HR 65 | Temp 97.7°F | Resp 19 | Ht <= 58 in | Wt 89.0 lb

## 2021-12-22 DIAGNOSIS — D631 Anemia in chronic kidney disease: Secondary | ICD-10-CM | POA: Diagnosis not present

## 2021-12-22 DIAGNOSIS — Z7901 Long term (current) use of anticoagulants: Secondary | ICD-10-CM

## 2021-12-22 DIAGNOSIS — N1831 Chronic kidney disease, stage 3a: Secondary | ICD-10-CM | POA: Insufficient documentation

## 2021-12-22 DIAGNOSIS — D500 Iron deficiency anemia secondary to blood loss (chronic): Secondary | ICD-10-CM

## 2021-12-22 LAB — CBC WITH DIFFERENTIAL (CANCER CENTER ONLY)
Abs Immature Granulocytes: 0.11 10*3/uL — ABNORMAL HIGH (ref 0.00–0.07)
Basophils Absolute: 0 10*3/uL (ref 0.0–0.1)
Basophils Relative: 1 %
Eosinophils Absolute: 0 10*3/uL (ref 0.0–0.5)
Eosinophils Relative: 0 %
HCT: 30.2 % — ABNORMAL LOW (ref 36.0–46.0)
Hemoglobin: 9.6 g/dL — ABNORMAL LOW (ref 12.0–15.0)
Immature Granulocytes: 1 %
Lymphocytes Relative: 21 %
Lymphs Abs: 1.7 10*3/uL (ref 0.7–4.0)
MCH: 30.1 pg (ref 26.0–34.0)
MCHC: 31.8 g/dL (ref 30.0–36.0)
MCV: 94.7 fL (ref 80.0–100.0)
Monocytes Absolute: 0.4 10*3/uL (ref 0.1–1.0)
Monocytes Relative: 5 %
Neutro Abs: 5.8 10*3/uL (ref 1.7–7.7)
Neutrophils Relative %: 72 %
Platelet Count: 224 10*3/uL (ref 150–400)
RBC: 3.19 MIL/uL — ABNORMAL LOW (ref 3.87–5.11)
RDW: 14.6 % (ref 11.5–15.5)
WBC Count: 8 10*3/uL (ref 4.0–10.5)
nRBC: 0 % (ref 0.0–0.2)

## 2021-12-22 LAB — CMP (CANCER CENTER ONLY)
ALT: 8 U/L (ref 0–44)
AST: 13 U/L — ABNORMAL LOW (ref 15–41)
Albumin: 3.7 g/dL (ref 3.5–5.0)
Alkaline Phosphatase: 16 U/L — ABNORMAL LOW (ref 38–126)
Anion gap: 8 (ref 5–15)
BUN: 23 mg/dL (ref 8–23)
CO2: 29 mmol/L (ref 22–32)
Calcium: 9.1 mg/dL (ref 8.9–10.3)
Chloride: 103 mmol/L (ref 98–111)
Creatinine: 1.13 mg/dL — ABNORMAL HIGH (ref 0.44–1.00)
GFR, Estimated: 45 mL/min — ABNORMAL LOW (ref >60)
Glucose, Bld: 126 mg/dL — ABNORMAL HIGH (ref 70–99)
Potassium: 3.9 mmol/L (ref 3.5–5.1)
Sodium: 140 mmol/L (ref 135–145)
Total Bilirubin: 0.3 mg/dL (ref 0.3–1.2)
Total Protein: 7.3 g/dL (ref 6.5–8.1)

## 2021-12-22 LAB — RETICULOCYTES
Immature Retic Fract: 7.6 % (ref 2.3–15.9)
RBC.: 3.18 MIL/uL — ABNORMAL LOW (ref 3.87–5.11)
Retic Count, Absolute: 36.6 10*3/uL (ref 19.0–186.0)
Retic Ct Pct: 1.2 % (ref 0.4–3.1)

## 2021-12-22 LAB — IRON AND IRON BINDING CAPACITY (CC-WL,HP ONLY)
Iron: 37 ug/dL (ref 28–170)
Saturation Ratios: 13 % (ref 10.4–31.8)
TIBC: 280 ug/dL (ref 250–450)
UIBC: 243 ug/dL (ref 148–442)

## 2021-12-22 LAB — FERRITIN: Ferritin: 45 ng/mL (ref 11–307)

## 2021-12-22 MED ORDER — DARBEPOETIN ALFA 300 MCG/0.6ML IJ SOSY
300.0000 ug | PREFILLED_SYRINGE | Freq: Once | INTRAMUSCULAR | Status: AC
  Administered 2021-12-22: 300 ug via SUBCUTANEOUS
  Filled 2021-12-22: qty 0.6

## 2021-12-22 NOTE — Patient Instructions (Signed)

## 2021-12-26 ENCOUNTER — Inpatient Hospital Stay: Payer: Medicare Other | Admitting: Family

## 2021-12-26 ENCOUNTER — Encounter: Payer: Self-pay | Admitting: Family

## 2021-12-26 VITALS — BP 144/48 | HR 66 | Temp 97.6°F | Resp 20 | Ht <= 58 in | Wt 88.0 lb

## 2021-12-26 DIAGNOSIS — D631 Anemia in chronic kidney disease: Secondary | ICD-10-CM

## 2021-12-26 DIAGNOSIS — D500 Iron deficiency anemia secondary to blood loss (chronic): Secondary | ICD-10-CM | POA: Diagnosis not present

## 2021-12-26 DIAGNOSIS — N1831 Chronic kidney disease, stage 3a: Secondary | ICD-10-CM | POA: Diagnosis not present

## 2021-12-26 NOTE — Progress Notes (Signed)
Hematology and Oncology Follow Up Visit  [name] [name] [id number] 1924/01/12 88 y.o. 12/26/2021   Principle Diagnosis:  Iron deficiency anemia  Erythropoietin deficiency anemia    Current Therapy:        IV iron as indicated  Aranesp 300 mcg SQ to maintain Hgb > 11   Interim History:  [name] [name] is here today with her daughter for follow-up. She is doing wlel and has no complaints at this time.  She did have gout in her left hand that resolved over the weekend.  No blood loss noted. No bruising or petechiae.  No fever, chills, n/v, cough, rash, dizziness, SOB, chest pain, palpitations, abdominal pain or changes in bowel or bladder habits.  No swelling, tenderness, numbness or tingling in her extremities at this time.  No falls or syncope.  Appetite and hydration are good. Weight is stable at 89 lbs.  ECOG Performance Status: 1 - Symptomatic but completely ambulatory  Medications:  Allergies as of 12/26/2021       Reactions   Amlodipine Shortness Of Breath   Darifenacin Hydrobromide Other (See Comments)   dizziness   Other Other (See Comments)   dizziness   Sulfonamide Derivatives Other (See Comments)   dizziness   Tramadol Other (See Comments)   Insomnia, anorexia        Medication List        Accurate as of December 26, 2021  2:08 PM. If you have any questions, ask your nurse or doctor.          ALPRAZolam 0.25 MG tablet Commonly known as: XANAX TAKE 0.5-1 TABLETS (0.125-0.25 MG TOTAL) BY MOUTH 2 (TWO) TIMES DAILY AS NEEDED FOR ANXIETY.   cefdinir 300 MG capsule Commonly known as: OMNICEF Take 300 mg by mouth 2 (two) times daily.   Eliquis 2.5 MG Tabs tablet Generic drug: apixaban TAKE 1 TABLET BY MOUTH TWICE A DAY   famotidine 20 MG tablet Commonly known as: PEPCID Take 20 mg by mouth at bedtime.   furosemide 20 MG tablet Commonly known as: LASIX Take 1 tablet (20 mg total) by mouth as needed for fluid or edema (swelling or weight gain of  2lbs overnight or 5lbs in a week).   Hemocyte-F 324-1 MG Tabs Generic drug: Ferrous Fumarate-Folic Acid Take 1 tablet by mouth daily.   isosorbide mononitrate 30 MG 24 hr tablet Commonly known as: IMDUR Take 1 tablet (30 mg total) by mouth daily.   levocetirizine 5 MG tablet Commonly known as: XYZAL Take 2.5 mg by mouth daily at 12 noon.   losartan 25 MG tablet Commonly known as: COZAAR TAKE 1 TABLET (25 MG TOTAL) BY MOUTH DAILY.   metoprolol succinate 25 MG 24 hr tablet Commonly known as: TOPROL-XL TAKE 1 TABLET (25 MG TOTAL) BY MOUTH DAILY.   pantoprazole 40 MG tablet Commonly known as: PROTONIX Take 1 tablet (40 mg total) by mouth daily.   Vitamin D 50 MCG (2000 UT) Caps Take 2,000 Units by mouth every other day.        Allergies:  Allergies  Allergen Reactions   Amlodipine Shortness Of Breath   Darifenacin Hydrobromide Other (See Comments)    dizziness   Other Other (See Comments)    dizziness   Sulfonamide Derivatives Other (See Comments)    dizziness   Tramadol Other (See Comments)    Insomnia, anorexia    Past Medical History, Surgical history, Social history, and Family History were reviewed and updated.  Review of Systems: All  other 10 point review of systems is negative.   Physical Exam:  vitals were not taken for this visit.   Wt Readings from Last 3 Encounters:  12/22/21 89 lb (40.4 kg)  11/06/21 88 lb (39.9 kg)  11/03/21 87 lb 4 oz (39.6 kg)    Ocular: Sclerae unicteric, pupils equal, round and reactive to light Ear-nose-throat: Oropharynx clear, dentition fair Lymphatic: No cervical or supraclavicular adenopathy Lungs no rales or rhonchi, good excursion bilaterally Heart regular rate and rhythm, no murmur appreciated Abd soft, nontender, positive bowel sounds MSK no focal spinal tenderness, no joint edema Neuro: non-focal, well-oriented, appropriate affect Breasts: Deferred   Lab Results  Component Value Date   WBC 8.0 12/22/2021    HGB 9.6 (L) 12/22/2021   HCT 30.2 (L) 12/22/2021   MCV 94.7 12/22/2021   PLT 224 12/22/2021   Lab Results  Component Value Date   FERRITIN 45 12/22/2021   IRON 37 12/22/2021   TIBC 280 12/22/2021   UIBC 243 12/22/2021   IRONPCTSAT 13 12/22/2021   Lab Results  Component Value Date   RETICCTPCT 1.2 12/22/2021   RBC 3.18 (L) 12/22/2021   No results found for: "KPAFRELGTCHN", "LAMBDASER", "KAPLAMBRATIO" No results found for: "IGGSERUM", "IGA", "IGMSERUM" No results found for: "TOTALPROTELP", "ALBUMINELP", "A1GS", "A2GS", "BETS", "BETA2SER", "GAMS", "MSPIKE", "SPEI"   Chemistry      Component Value Date/Time   NA 140 12/22/2021 1000   NA 139 04/11/2021 1312   K 3.9 12/22/2021 1000   CL 103 12/22/2021 1000   CO2 29 12/22/2021 1000   BUN 23 12/22/2021 1000   BUN 30 04/11/2021 1312   CREATININE 1.13 (H) 12/22/2021 1000   CREATININE 1.15 (H) 10/29/2019 1358      Component Value Date/Time   CALCIUM 9.1 12/22/2021 1000   ALKPHOS 16 (L) 12/22/2021 1000   AST 13 (L) 12/22/2021 1000   ALT 8 12/22/2021 1000   BILITOT 0.3 12/22/2021 1000       Impression and Plan: [name] [name] is a very pleasant 88 yo caucasian female with multifactorial anemia (IDA and erythropoietin deficiency).  Lab and injection every 2 weeks, follow-up in 6 weeks.   Lottie Dawson, NP 12/26/20232:08 PM

## 2021-12-29 ENCOUNTER — Emergency Department (HOSPITAL_BASED_OUTPATIENT_CLINIC_OR_DEPARTMENT_OTHER)
Admission: EM | Admit: 2021-12-29 | Discharge: 2021-12-29 | Disposition: A | Discharge status: Home / Self Care | Payer: Medicare Other | Attending: Emergency Medicine | Admitting: Emergency Medicine

## 2021-12-29 ENCOUNTER — Other Ambulatory Visit: Payer: Self-pay

## 2021-12-29 ENCOUNTER — Emergency Department (HOSPITAL_BASED_OUTPATIENT_CLINIC_OR_DEPARTMENT_OTHER): Payer: Medicare Other

## 2021-12-29 ENCOUNTER — Encounter (HOSPITAL_BASED_OUTPATIENT_CLINIC_OR_DEPARTMENT_OTHER): Payer: Self-pay | Admitting: Urology

## 2021-12-29 DIAGNOSIS — Z7901 Long term (current) use of anticoagulants: Secondary | ICD-10-CM | POA: Diagnosis not present

## 2021-12-29 DIAGNOSIS — I6381 Other cerebral infarction due to occlusion or stenosis of small artery: Secondary | ICD-10-CM | POA: Diagnosis not present

## 2021-12-29 DIAGNOSIS — S199XXA Unspecified injury of neck, initial encounter: Secondary | ICD-10-CM | POA: Diagnosis not present

## 2021-12-29 DIAGNOSIS — Z79899 Other long term (current) drug therapy: Secondary | ICD-10-CM | POA: Diagnosis not present

## 2021-12-29 DIAGNOSIS — R918 Other nonspecific abnormal finding of lung field: Secondary | ICD-10-CM | POA: Diagnosis not present

## 2021-12-29 DIAGNOSIS — R519 Headache, unspecified: Secondary | ICD-10-CM | POA: Insufficient documentation

## 2021-12-29 DIAGNOSIS — M62838 Other muscle spasm: Secondary | ICD-10-CM | POA: Diagnosis not present

## 2021-12-29 DIAGNOSIS — J929 Pleural plaque without asbestos: Secondary | ICD-10-CM | POA: Diagnosis not present

## 2021-12-29 DIAGNOSIS — J439 Emphysema, unspecified: Secondary | ICD-10-CM | POA: Diagnosis not present

## 2021-12-29 LAB — CBC WITH DIFFERENTIAL/PLATELET
Abs Immature Granulocytes: 0.03 10*3/uL (ref 0.00–0.07)
Basophils Absolute: 0 10*3/uL (ref 0.0–0.1)
Basophils Relative: 0 %
Eosinophils Absolute: 0 10*3/uL (ref 0.0–0.5)
Eosinophils Relative: 0 %
HCT: 29.3 % — ABNORMAL LOW (ref 36.0–46.0)
Hemoglobin: 9.3 g/dL — ABNORMAL LOW (ref 12.0–15.0)
Immature Granulocytes: 0 %
Lymphocytes Relative: 19 %
Lymphs Abs: 1.3 10*3/uL (ref 0.7–4.0)
MCH: 30.2 pg (ref 26.0–34.0)
MCHC: 31.7 g/dL (ref 30.0–36.0)
MCV: 95.1 fL (ref 80.0–100.0)
Monocytes Absolute: 0.4 10*3/uL (ref 0.1–1.0)
Monocytes Relative: 6 %
Neutro Abs: 5 10*3/uL (ref 1.7–7.7)
Neutrophils Relative %: 75 %
Platelets: 261 10*3/uL (ref 150–400)
RBC: 3.08 MIL/uL — ABNORMAL LOW (ref 3.87–5.11)
RDW: 15.6 % — ABNORMAL HIGH (ref 11.5–15.5)
WBC: 6.8 10*3/uL (ref 4.0–10.5)
nRBC: 0 % (ref 0.0–0.2)

## 2021-12-29 LAB — BASIC METABOLIC PANEL
Anion gap: 7 (ref 5–15)
BUN: 22 mg/dL (ref 8–23)
CO2: 27 mmol/L (ref 22–32)
Calcium: 8.6 mg/dL — ABNORMAL LOW (ref 8.9–10.3)
Chloride: 103 mmol/L (ref 98–111)
Creatinine, Ser: 1.16 mg/dL — ABNORMAL HIGH (ref 0.44–1.00)
GFR, Estimated: 43 mL/min — ABNORMAL LOW (ref >60)
Glucose, Bld: 133 mg/dL — ABNORMAL HIGH (ref 70–99)
Potassium: 4.3 mmol/L (ref 3.5–5.1)
Sodium: 137 mmol/L (ref 135–145)

## 2021-12-29 MED ORDER — PREDNISONE 10 MG PO TABS: 20.0000 mg | ORAL_TABLET | Freq: Every day | ORAL | 0 refills | Status: AC

## 2021-12-29 MED ORDER — LIDOCAINE 5 % EX PTCH: 1.0000 | MEDICATED_PATCH | CUTANEOUS | 0 refills | Status: DC

## 2021-12-29 MED ORDER — DICLOFENAC SODIUM 1 % EX GEL: 2.0000 g | Freq: Four times a day (QID) | CUTANEOUS | 0 refills | Status: DC

## 2021-12-29 MED ORDER — LIDOCAINE 5 % EX PTCH
1.0000 | MEDICATED_PATCH | CUTANEOUS | Status: DC
  Administered 2021-12-29: 1 via TRANSDERMAL
  Filled 2021-12-29: qty 1

## 2021-12-29 MED ORDER — PREDNISONE 20 MG PO TABS
20.0000 mg | ORAL_TABLET | Freq: Once | ORAL | Status: AC
  Administered 2021-12-29: 20 mg via ORAL
  Filled 2021-12-29: qty 1

## 2021-12-29 NOTE — ED Triage Notes (Signed)
Pt states generalized headache x 4 days radiating into neck  Denies N/V, Denies Fever  States pain with neck rotation at this time Denies any injury or fall   Pt hypertensive at triage, took bp meds today

## 2021-12-29 NOTE — Discharge Instructions (Signed)
Your images today were normal.  Overall suspect that this is a muscle spasm.  Recommend using lidocaine patches as prescribed.  Recommend 1000 mg of Tylenol every 6 hours as needed for pain.  Consider filling Voltaren prescription if able to afford.  Recommend taking prednisone as prescribed as well.  Follow-up with primary care doctor for further pain management.

## 2021-12-29 NOTE — ED Provider Notes (Signed)
[facility] EMERGENCY DEPARTMENT Provider Note   CSN: [id number] Arrival date & time: 12/29/21  1637     History  Chief Complaint  Patient presents with   Headache    [name] [name] is a 88 y.o. female.  Patient here with muscle spasm in the neck, head.  Denies any weakness, numbness, fever, chills.  Nothing makes it worse or better.  Pain on and off for several weeks.  Thinks that she slept wrong.  History of IBS, valvular disease, hypertension.  Denies any stroke symptoms.  No chest pain or shortness of breath.  Nothing makes it worse or better.  No nasal congestion.  No viral symptoms.  The history is provided by the patient.       Home Medications Prior to Admission medications   Medication Sig Start Date End Date Taking? Authorizing Provider  diclofenac Sodium (VOLTAREN) 1 % GEL Apply 2 g topically 4 (four) times daily. 12/29/21  Yes Daymian Lill, DO  lidocaine (LIDODERM) 5 % Place 1 patch onto the skin daily. Remove & Discard patch within 12 hours or as directed by MD 12/29/21  Yes Jacque Garrels, DO  predniSONE (DELTASONE) 10 MG tablet Take 2 tablets (20 mg total) by mouth daily for 4 days. 12/29/21 01/02/22 Yes Dava Rensch, DO  ALPRAZolam (XANAX) 0.25 MG tablet TAKE 0.5-1 TABLETS (0.125-0.25 MG TOTAL) BY MOUTH 2 (TWO) TIMES DAILY AS NEEDED FOR ANXIETY. Patient not taking: Reported on 12/26/2021 09/11/21   Mosie Lukes, MD  apixaban (ELIQUIS) 2.5 MG TABS tablet TAKE 1 TABLET BY MOUTH TWICE A DAY 04/28/21   [name], MD  Cholecalciferol (VITAMIN D) 2000 UNITS CAPS Take 2,000 Units by mouth every other day. 02/20/12   Mosie Lukes, MD  famotidine (PEPCID) 20 MG tablet Take 20 mg by mouth at bedtime.    [provider]  Ferrous Fumarate-Folic Acid (HEMOCYTE-F) 324-1 MG TABS Take 1 tablet by mouth daily. 09/19/21   Danford, Suann Larry, MD  furosemide (LASIX) 20 MG tablet Take 1 tablet (20 mg total) by mouth as needed for fluid or edema  (swelling or weight gain of 2lbs overnight or 5lbs in a week). 04/04/21 10/13/21  Marylu Lund., NP  isosorbide mononitrate (IMDUR) 30 MG 24 hr tablet Take 1 tablet (30 mg total) by mouth daily. 08/10/21   [name], MD  levocetirizine (XYZAL) 5 MG tablet Take 2.5 mg by mouth daily at 12 noon. 08/03/21   [provider]  losartan (COZAAR) 25 MG tablet TAKE 1 TABLET (25 MG TOTAL) BY MOUTH DAILY. 11/17/21   Dutch Quint B, FNP  metoprolol succinate (TOPROL-XL) 25 MG 24 hr tablet TAKE 1 TABLET (25 MG TOTAL) BY MOUTH DAILY. 09/05/21   Mosie Lukes, MD  pantoprazole (PROTONIX) 40 MG tablet Take 1 tablet (40 mg total) by mouth daily. 09/06/21   Mosie Lukes, MD      Allergies    Amlodipine, Darifenacin hydrobromide, Other, Sulfonamide derivatives, and Tramadol    Review of Systems   Review of Systems  Physical Exam Updated Vital Signs BP (!) 182/61 (BP Location: Right Arm)   Pulse 65   Temp 97.6 F (36.4 C) (Oral)   Resp 18   Ht '4\' 10"'$  (1.473 m)   Wt 39.5 kg   SpO2 98%   BMI 18.18 kg/m  Physical Exam Vitals and nursing note reviewed.  Constitutional:      General: She is not in acute distress.  Appearance: She is well-developed. She is not ill-appearing.  HENT:     Head: Normocephalic and atraumatic.     Mouth/Throat:     Mouth: Mucous membranes are moist.  Eyes:     Extraocular Movements: Extraocular movements intact.     Right eye: Normal extraocular motion and no nystagmus.     Left eye: Normal extraocular motion and no nystagmus.     Conjunctiva/sclera: Conjunctivae normal.     Pupils: Pupils are equal, round, and reactive to light.  Cardiovascular:     Rate and Rhythm: Normal rate and regular rhythm.     Heart sounds: Normal heart sounds. No murmur heard. Pulmonary:     Effort: Pulmonary effort is normal. No respiratory distress.     Breath sounds: Normal breath sounds.  Abdominal:     Palpations: Abdomen is soft.     Tenderness: There is no  abdominal tenderness.  Musculoskeletal:        General: Tenderness present. No swelling. Normal range of motion.     Cervical back: Normal range of motion and neck supple. No rigidity.     Comments: Tenderness to the paraspinal cervical muscles bilaterally  Skin:    General: Skin is warm and dry.     Capillary Refill: Capillary refill takes less than 2 seconds.  Neurological:     Mental Status: She is alert and oriented to person, place, and time.     Comments: 5+ out of 5 strength throughout, normal sensation, no drift, normal finger-nose-finger, normal speech.  Psychiatric:        Mood and Affect: Mood normal.     ED Results / Procedures / Treatments   Labs (all labs ordered are listed, but only abnormal results are displayed) Labs Reviewed  CBC WITH DIFFERENTIAL/PLATELET - Abnormal; Notable for the following components:      Result Value   RBC 3.08 (*)    Hemoglobin 9.3 (*)    HCT 29.3 (*)    RDW 15.6 (*)    All other components within normal limits  BASIC METABOLIC PANEL - Abnormal; Notable for the following components:   Glucose, Bld 133 (*)    Creatinine, Ser 1.16 (*)    Calcium 8.6 (*)    GFR, Estimated 43 (*)    All other components within normal limits    EKG None  Radiology CT Cervical Spine Wo Contrast  Result Date: 12/29/2021 CLINICAL DATA:  Trauma EXAM: CT CERVICAL SPINE WITHOUT CONTRAST TECHNIQUE: Multidetector CT imaging of the cervical spine was performed without intravenous contrast. Multiplanar CT image reconstructions were also generated. RADIATION DOSE REDUCTION: This exam was performed according to the departmental dose-optimization program which includes automated exposure control, adjustment of the mA and/or kV according to patient size and/or use of iterative reconstruction technique. COMPARISON:  None Available. FINDINGS: Alignment: There is exaggeration of lordosis. Skull base and vertebrae: No recent fracture is seen. Degenerative changes are  noted. Soft tissues and spinal canal: There is no central spinal stenosis. Prevertebral soft tissues are unremarkable. Disc levels: There is encroachment of neural foramina to varying degrees from C2 to C7 levels. Upper chest: Pleural thickening is seen in both apices. Few blebs are noted in both apices. Emphysematous changes are noted. Other: None IMPRESSION: No recent fracture is seen. Degenerative changes are noted with encroachment of neural foramina at multiple levels. COPD. Pleural thickening and subpleural blebs are seen in both apices suggesting scarring. Electronically Signed   By: Elmer Picker M.D.   On:  12/29/2021 17:58   CT Head Wo Contrast  Result Date: 12/29/2021 CLINICAL DATA:  Headaches x4 days EXAM: CT HEAD WITHOUT CONTRAST TECHNIQUE: Contiguous axial images were obtained from the base of the skull through the vertex without intravenous contrast. RADIATION DOSE REDUCTION: This exam was performed according to the departmental dose-optimization program which includes automated exposure control, adjustment of the mA and/or kV according to patient size and/or use of iterative reconstruction technique. COMPARISON:  02/21/2016 FINDINGS: Brain: No acute intracranial findings are seen. Ventricles are not dilated. There are no signs of bleeding within the cranium. Small old lacunar infarct is seen in left basal ganglia. Calcifications are seen in basal ganglia on both sides. Cortical sulci are prominent. There is decreased density in periventricular white matter. Vascular: Scattered arterial calcifications are seen. Skull: Unremarkable. Sinuses/Orbits: Paranasal sinuses are unremarkable. There is evidence of possible previous cataract surgery in both optic globes. Other: None. IMPRESSION: No acute intracranial findings are seen. Atrophy. Small-vessel disease. Old lacunar infarct is seen in left basal ganglia. Electronically Signed   By: Elmer Picker M.D.   On: 12/29/2021 17:54     Procedures Procedures    Medications Ordered in ED Medications  lidocaine (LIDODERM) 5 % 1 patch (has no administration in time range)  predniSONE (DELTASONE) tablet 20 mg (has no administration in time range)    ED Course/ Medical Decision Making/ A&P                           Medical Decision Making Amount and/or Complexity of Data Reviewed Labs: ordered. Radiology: ordered.  Risk Prescription drug management.   Gladys Damme is here with neck pain, headache.  Patient with mildly elevated blood pressure but otherwise normal vitals.  She has a history of acid reflux, IBS, hypertension.  Patient with some neck discomfort for the last several weeks on and off.  She thinks that she slept wrong.  She is got tenderness to the paraspinal cervical muscles bilaterally.  Neurologically she is intact.  Overall I suspect differential is muscle spasm but will get a head CT and neck CT to evaluate for traumatic processes or head bleed.  Will check basic labs.  I have no concern for meningitis or infectious process.  She is very well-appearing.  Will do prednisone and lidocaine patch.  Per my review and interpretation of labs no significant anemia or electrolyte abnormality.  Creatinine at baseline.  CT scan of the head and neck are unremarkable.  Will prescribe prednisone, lidocaine patches and Voltaren gel.  Recommend follow-up with primary care doctor.  Discharged in good condition.  This chart was dictated using voice recognition software.  Despite best efforts to proofread,  errors can occur which can change the documentation meaning.         Final Clinical Impression(s) / ED Diagnoses Final diagnoses:  Muscle spasm    Rx / DC Orders ED Discharge Orders          Ordered    lidocaine (LIDODERM) 5 %  Every 24 hours        12/29/21 1818    diclofenac Sodium (VOLTAREN) 1 % GEL  4 times daily        12/29/21 1818    predniSONE (DELTASONE) 10 MG tablet  Daily        12/29/21  [place], [place], DO 12/29/21 1821

## 2022-01-09 ENCOUNTER — Inpatient Hospital Stay: Payer: Medicare Other

## 2022-01-10 ENCOUNTER — Inpatient Hospital Stay: Payer: Medicare Other

## 2022-01-10 ENCOUNTER — Inpatient Hospital Stay: Payer: Medicare Other | Attending: Hematology & Oncology

## 2022-01-10 VITALS — BP 140/44 | HR 70 | Temp 97.6°F | Resp 19

## 2022-01-10 DIAGNOSIS — Z7901 Long term (current) use of anticoagulants: Secondary | ICD-10-CM

## 2022-01-10 DIAGNOSIS — D631 Anemia in chronic kidney disease: Secondary | ICD-10-CM | POA: Insufficient documentation

## 2022-01-10 DIAGNOSIS — N1831 Chronic kidney disease, stage 3a: Secondary | ICD-10-CM | POA: Diagnosis not present

## 2022-01-10 DIAGNOSIS — D500 Iron deficiency anemia secondary to blood loss (chronic): Secondary | ICD-10-CM

## 2022-01-10 LAB — CMP (CANCER CENTER ONLY)
ALT: 7 U/L (ref 0–44)
AST: 12 U/L — ABNORMAL LOW (ref 15–41)
Albumin: 3.5 g/dL (ref 3.5–5.0)
Alkaline Phosphatase: 15 U/L — ABNORMAL LOW (ref 38–126)
Anion gap: 8 (ref 5–15)
BUN: 31 mg/dL — ABNORMAL HIGH (ref 8–23)
CO2: 27 mmol/L (ref 22–32)
Calcium: 9.3 mg/dL (ref 8.9–10.3)
Chloride: 103 mmol/L (ref 98–111)
Creatinine: 1.36 mg/dL — ABNORMAL HIGH (ref 0.44–1.00)
GFR, Estimated: 36 mL/min — ABNORMAL LOW (ref >60)
Glucose, Bld: 117 mg/dL — ABNORMAL HIGH (ref 70–99)
Potassium: 5 mmol/L (ref 3.5–5.1)
Sodium: 138 mmol/L (ref 135–145)
Total Bilirubin: 0.3 mg/dL (ref 0.3–1.2)
Total Protein: 7.2 g/dL (ref 6.5–8.1)

## 2022-01-10 LAB — CBC WITH DIFFERENTIAL (CANCER CENTER ONLY)
Abs Immature Granulocytes: 0.02 10*3/uL (ref 0.00–0.07)
Basophils Absolute: 0 10*3/uL (ref 0.0–0.1)
Basophils Relative: 1 %
Eosinophils Absolute: 0 10*3/uL (ref 0.0–0.5)
Eosinophils Relative: 0 %
HCT: 31.5 % — ABNORMAL LOW (ref 36.0–46.0)
Hemoglobin: 9.8 g/dL — ABNORMAL LOW (ref 12.0–15.0)
Immature Granulocytes: 0 %
Lymphocytes Relative: 18 %
Lymphs Abs: 1.6 10*3/uL (ref 0.7–4.0)
MCH: 30.8 pg (ref 26.0–34.0)
MCHC: 31.1 g/dL (ref 30.0–36.0)
MCV: 99.1 fL (ref 80.0–100.0)
Monocytes Absolute: 0.5 10*3/uL (ref 0.1–1.0)
Monocytes Relative: 5 %
Neutro Abs: 6.5 10*3/uL (ref 1.7–7.7)
Neutrophils Relative %: 76 %
Platelet Count: 244 10*3/uL (ref 150–400)
RBC: 3.18 MIL/uL — ABNORMAL LOW (ref 3.87–5.11)
RDW: 15.9 % — ABNORMAL HIGH (ref 11.5–15.5)
WBC Count: 8.6 10*3/uL (ref 4.0–10.5)
nRBC: 0 % (ref 0.0–0.2)

## 2022-01-10 MED ORDER — DARBEPOETIN ALFA 300 MCG/0.6ML IJ SOSY
300.0000 ug | PREFILLED_SYRINGE | Freq: Once | INTRAMUSCULAR | Status: AC
  Administered 2022-01-10: 300 ug via SUBCUTANEOUS
  Filled 2022-01-10: qty 0.6

## 2022-01-10 NOTE — Patient Instructions (Signed)

## 2022-01-17 DIAGNOSIS — L570 Actinic keratosis: Secondary | ICD-10-CM | POA: Diagnosis not present

## 2022-01-17 DIAGNOSIS — L97819 Non-pressure chronic ulcer of other part of right lower leg with unspecified severity: Secondary | ICD-10-CM | POA: Diagnosis not present

## 2022-01-18 ENCOUNTER — Ambulatory Visit: Payer: Medicare Other | Admitting: Family Medicine

## 2022-01-23 ENCOUNTER — Inpatient Hospital Stay: Payer: Medicare Other

## 2022-01-23 VITALS — BP 133/53 | HR 60 | Temp 97.8°F | Resp 17

## 2022-01-23 DIAGNOSIS — N1831 Chronic kidney disease, stage 3a: Secondary | ICD-10-CM | POA: Diagnosis not present

## 2022-01-23 DIAGNOSIS — D631 Anemia in chronic kidney disease: Secondary | ICD-10-CM

## 2022-01-23 DIAGNOSIS — D500 Iron deficiency anemia secondary to blood loss (chronic): Secondary | ICD-10-CM

## 2022-01-23 DIAGNOSIS — Z7901 Long term (current) use of anticoagulants: Secondary | ICD-10-CM

## 2022-01-23 LAB — CBC WITH DIFFERENTIAL (CANCER CENTER ONLY)
Abs Immature Granulocytes: 0.02 10*3/uL (ref 0.00–0.07)
Basophils Absolute: 0 10*3/uL (ref 0.0–0.1)
Basophils Relative: 0 %
Eosinophils Absolute: 0 10*3/uL (ref 0.0–0.5)
Eosinophils Relative: 0 %
HCT: 34 % — ABNORMAL LOW (ref 36.0–46.0)
Hemoglobin: 10.5 g/dL — ABNORMAL LOW (ref 12.0–15.0)
Immature Granulocytes: 0 %
Lymphocytes Relative: 17 %
Lymphs Abs: 1.4 10*3/uL (ref 0.7–4.0)
MCH: 30.5 pg (ref 26.0–34.0)
MCHC: 30.9 g/dL (ref 30.0–36.0)
MCV: 98.8 fL (ref 80.0–100.0)
Monocytes Absolute: 0.6 10*3/uL (ref 0.1–1.0)
Monocytes Relative: 7 %
Neutro Abs: 6.5 10*3/uL (ref 1.7–7.7)
Neutrophils Relative %: 76 %
Platelet Count: 298 10*3/uL (ref 150–400)
RBC: 3.44 MIL/uL — ABNORMAL LOW (ref 3.87–5.11)
RDW: 16.6 % — ABNORMAL HIGH (ref 11.5–15.5)
WBC Count: 8.6 10*3/uL (ref 4.0–10.5)
nRBC: 0 % (ref 0.0–0.2)

## 2022-01-23 LAB — CMP (CANCER CENTER ONLY)
ALT: 8 U/L (ref 0–44)
AST: 12 U/L — ABNORMAL LOW (ref 15–41)
Albumin: 3.5 g/dL (ref 3.5–5.0)
Alkaline Phosphatase: 16 U/L — ABNORMAL LOW (ref 38–126)
Anion gap: 8 (ref 5–15)
BUN: 26 mg/dL — ABNORMAL HIGH (ref 8–23)
CO2: 28 mmol/L (ref 22–32)
Calcium: 9.5 mg/dL (ref 8.9–10.3)
Chloride: 102 mmol/L (ref 98–111)
Creatinine: 1.47 mg/dL — ABNORMAL HIGH (ref 0.44–1.00)
GFR, Estimated: 32 mL/min — ABNORMAL LOW (ref >60)
Glucose, Bld: 101 mg/dL — ABNORMAL HIGH (ref 70–99)
Potassium: 5 mmol/L (ref 3.5–5.1)
Sodium: 138 mmol/L (ref 135–145)
Total Bilirubin: 0.3 mg/dL (ref 0.3–1.2)
Total Protein: 6.9 g/dL (ref 6.5–8.1)

## 2022-01-23 MED ORDER — DARBEPOETIN ALFA 300 MCG/0.6ML IJ SOSY
300.0000 ug | PREFILLED_SYRINGE | Freq: Once | INTRAMUSCULAR | Status: AC
  Administered 2022-01-23: 300 ug via SUBCUTANEOUS
  Filled 2022-01-23: qty 0.6

## 2022-01-30 ENCOUNTER — Ambulatory Visit (INDEPENDENT_AMBULATORY_CARE_PROVIDER_SITE_OTHER): Payer: Medicare Other | Admitting: Internal Medicine

## 2022-01-30 ENCOUNTER — Encounter: Payer: Self-pay | Admitting: Internal Medicine

## 2022-01-30 VITALS — BP 126/74 | HR 80 | Temp 98.0°F | Resp 18 | Ht <= 58 in | Wt 88.5 lb

## 2022-01-30 DIAGNOSIS — R7982 Elevated C-reactive protein (CRP): Secondary | ICD-10-CM

## 2022-01-30 DIAGNOSIS — G4452 New daily persistent headache (NDPH): Secondary | ICD-10-CM | POA: Diagnosis not present

## 2022-01-30 DIAGNOSIS — M791 Myalgia, unspecified site: Secondary | ICD-10-CM | POA: Diagnosis not present

## 2022-01-30 DIAGNOSIS — E559 Vitamin D deficiency, unspecified: Secondary | ICD-10-CM

## 2022-01-30 DIAGNOSIS — M542 Cervicalgia: Secondary | ICD-10-CM | POA: Diagnosis not present

## 2022-01-30 NOTE — Progress Notes (Unsigned)
Subjective:    Patient ID: [name] [name], female    DOB: 1924/11/02, 89 y.o.   MRN: [id number]  DOS:  01/30/2022 Type of visit - description: acute, here w/ her daughter   Symptoms started at least a couple of months ago. Reports a headache, pain is located at the sides of the head, nuchal area w/ some radiation to the shoulders.  Headache is worse with neck motion to the left or right. No upper or lower extremity paresthesias She said that she is feeling "bad", denies generalized pain today but has complained to her daughter about "aches and pains"  No fever or chills No significant weight loss No jaw claudication or amaurosis fugax She has a chronic GI bleed with black stools but no abdominal pain no diarrhea No cough or sputum production  With above symptoms, went to the ER 12/29/2021 W/u included: Creatinine 1.1, calcium 8.6, CBC stable, CT head no acute.  CT cervical spine without contrast: DJD, no acute problems.  Was Rx some prednisone  Review of Systems See above   Past Medical History:  Diagnosis Date   Anemia    Anxiety    Arthritis    hips, knees   Current use of long term anticoagulation    Diverticulitis of colon 02/06/2015   Gait difficulty    GERD (gastroesophageal reflux disease)    Headache(784.0) 06/22/2012   Hyperlipidemia    Hypertension    IBS (irritable bowel syndrome)    Incontinence    Loss of weight 06/05/2015   Medicare annual wellness visit, subsequent 02/05/2014   Melena    Mild mitral regurgitation    Mild tricuspid regurgitation    Mitral and aortic regurgitation    Mitral valve prolapse    hx of - not seen on recent echoes   Osteopenia    Paroxysmal atrial flutter ([place])    Pedal edema 10/01/2016   Persistent atrial fibrillation ([facility])    Personal history of colonic polyps    Renal lithiasis 06/26/2015   Sinus bradycardia    Thyroid disease    hypo   Vitamin B12 deficiency 03/16/2016   Vitamin D deficiency     Past Surgical History:   Procedure Laterality Date   BOTOX INJECTION N/A 09/03/2012   Procedure: BOTOX INJECTION;  Surgeon: Inda Castle, MD;  Location: [facility];  Service: Endoscopy;  Laterality: N/A;   BREAST BIOPSY Left    BREAST LUMPECTOMY Right 08/18/2014   Procedure: RIGHT BREAST LUMPECTOMY;  Surgeon: Coralie Keens, MD;  Location: [facility];  Service: General;  Laterality: Right;   CARDIAC ELECTROPHYSIOLOGY MAPPING AND ABLATION     CATARACT EXTRACTION, BILATERAL     ESOPHAGOGASTRODUODENOSCOPY N/A 09/03/2012   Procedure: ESOPHAGOGASTRODUODENOSCOPY (EGD);  Surgeon: Inda Castle, MD;  Location: [facility];  Service: Endoscopy;  Laterality: N/A;   ESOPHAGOGASTRODUODENOSCOPY (EGD) WITH PROPOFOL N/A 12/02/2018   Procedure: ESOPHAGOGASTRODUODENOSCOPY (EGD) WITH PROPOFOL;  Surgeon: [name], MD;  Location: [facility];  Service: Gastroenterology;  Laterality: N/A;   ESOPHAGOSCOPY W/ BOTOX INJECTION     I & D EXTREMITY Right 11/06/2012   Procedure: IRRIGATION AND DEBRIDEMENT EXTREMITY Right Ring Finger;  Surgeon: Tennis Must, MD;  Location: [place];  Service: Orthopedics;  Laterality: Right;   PARTIAL HYSTERECTOMY     ovaries left in place   skin cancer removal      Current Outpatient Medications  Medication Instructions   ALPRAZolam (XANAX) 0.125-0.25 mg, Oral, 2 times daily PRN  apixaban (ELIQUIS) 2.5 MG TABS tablet TAKE 1 TABLET BY MOUTH TWICE A DAY   diclofenac Sodium (VOLTAREN) 2 g, Topical, 4 times daily   famotidine (PEPCID) 20 mg, Oral, Daily at bedtime   Ferrous Fumarate-Folic Acid (HEMOCYTE-F) 324-1 MG TABS 1 tablet, Oral, Daily   furosemide (LASIX) 20 mg, Oral, As needed   isosorbide mononitrate (IMDUR) 30 mg, Oral, Daily   levocetirizine (XYZAL) 2.5 mg, Oral, Daily   lidocaine (LIDODERM) 5 % 1 patch, Transdermal, Every 24 hours, Remove & Discard patch within 12 hours or as directed by MD   losartan (COZAAR) 25 mg, Oral, Daily   metoprolol succinate (TOPROL-XL) 25  mg, Oral, Daily   pantoprazole (PROTONIX) 40 mg, Oral, Daily   Vitamin D 2,000 Units, Oral, Every other day       Objective:   Physical Exam BP 126/74   Pulse 80   Temp 98 F (36.7 C) (Oral)   Resp 18   Ht '4\' 10"'$  (1.473 m)   Wt 88 lb 8 oz (40.1 kg)   SpO2 98%   BMI 18.50 kg/m  General:   Elderly lady in no distress. HEENT:  Normocephalic . Face symmetric, atraumatic Palpable nontender temporal artery artery pulses. TMJs: No click Neck: No TTP at the cervical spine.  Range of motion slightly limited. Lungs:  CTA B Normal respiratory effort, no intercostal retractions, no accessory muscle use. Heart: RRR,  no murmur.  Lower extremities: no pretibial edema bilaterally  Skin: Not pale. Not jaundice Neurologic:  alert & oriented X3.  Speech normal, gait assisted by walker  psych--  Cognition and judgment appear intact.  Cooperative with normal attention span and concentration.  Behavior appropriate. No anxious or depressed appearing.      Assessment    89 year old female, PMH includes A-fib, anticoagulated, hypertension: Mitral regurgitation, multifactorial anemia (IDA, erythropoietin deficiency) presents with:  Headache, neck pain, myalgias (?). As described above, I suspect  neck DJD is the main pain generator of pain  however given her age other issues are possible such as PMR although my suspicion is low. Plan: [facility] referral, Dr. Herma Mering, local injections of the neck? Sed rate, total CK, CRP and vitamin D. Chronic GI bleed: h/o admitted GI bleed last year with a hemoglobin around 5, GI was consulted, no endoscopies were not  done, saw GI as an outpatient and they recommended to keep the hemoglobin stable with IV iron.  She sees hematology.  Last hemoglobin 10.5 few days ago. Permanent A-fib anticoagulated, last seen by cardiology 10/13/2021.  At the time the patient elected to continue anticoagulation. Vitamin D deficiency: Checking labs. RTC 4 weeks.

## 2022-01-30 NOTE — Patient Instructions (Addendum)
Please proceed with blood work  I will keep you informed of the results  I am referring you to a neck specialist, you might benefit from local injections there.  Tylenol  500 mg OTC 2 tabs a day every 8 hours as needed for pain   Consider stop your anticoagulation (Eliquis). Reach out to your heart doctor for an opinion.  Please come back to see your primary doctor in 4 weeks.  Make an appointment.

## 2022-01-31 LAB — CK: Total CK: 16 U/L (ref 7–177)

## 2022-01-31 LAB — SEDIMENTATION RATE: Sed Rate: 42 mm/hr — ABNORMAL HIGH (ref 0–30)

## 2022-01-31 LAB — VITAMIN D 25 HYDROXY (VIT D DEFICIENCY, FRACTURES): VITD: 43.72 ng/mL (ref 30.00–100.00)

## 2022-01-31 LAB — HIGH SENSITIVITY CRP: CRP, High Sensitivity: 34.12 mg/L — ABNORMAL HIGH (ref 0.000–5.000)

## 2022-02-02 NOTE — Addendum Note (Signed)
Addended byDamita Dunnings D on: 02/02/2022 01:13 PM   Modules accepted: Orders

## 2022-02-06 ENCOUNTER — Other Ambulatory Visit: Payer: Self-pay

## 2022-02-06 ENCOUNTER — Inpatient Hospital Stay: Payer: Medicare Other

## 2022-02-06 ENCOUNTER — Inpatient Hospital Stay: Payer: Medicare Other | Attending: Hematology & Oncology | Admitting: Family

## 2022-02-06 ENCOUNTER — Encounter: Payer: Self-pay | Admitting: Family

## 2022-02-06 ENCOUNTER — Ambulatory Visit (INDEPENDENT_AMBULATORY_CARE_PROVIDER_SITE_OTHER): Payer: Medicare Other | Admitting: Family Medicine

## 2022-02-06 VITALS — BP 141/74 | HR 83 | Temp 98.1°F | Resp 19 | Ht <= 58 in | Wt 85.0 lb

## 2022-02-06 VITALS — BP 128/52 | HR 82 | Temp 98.0°F | Resp 16 | Ht <= 58 in | Wt 86.8 lb

## 2022-02-06 DIAGNOSIS — N1831 Chronic kidney disease, stage 3a: Secondary | ICD-10-CM | POA: Insufficient documentation

## 2022-02-06 DIAGNOSIS — R252 Cramp and spasm: Secondary | ICD-10-CM

## 2022-02-06 DIAGNOSIS — I10 Essential (primary) hypertension: Secondary | ICD-10-CM | POA: Diagnosis not present

## 2022-02-06 DIAGNOSIS — I4819 Other persistent atrial fibrillation: Secondary | ICD-10-CM

## 2022-02-06 DIAGNOSIS — D509 Iron deficiency anemia, unspecified: Secondary | ICD-10-CM | POA: Diagnosis not present

## 2022-02-06 DIAGNOSIS — E538 Deficiency of other specified B group vitamins: Secondary | ICD-10-CM | POA: Diagnosis not present

## 2022-02-06 DIAGNOSIS — D500 Iron deficiency anemia secondary to blood loss (chronic): Secondary | ICD-10-CM

## 2022-02-06 DIAGNOSIS — M1990 Unspecified osteoarthritis, unspecified site: Secondary | ICD-10-CM

## 2022-02-06 DIAGNOSIS — E559 Vitamin D deficiency, unspecified: Secondary | ICD-10-CM | POA: Diagnosis not present

## 2022-02-06 DIAGNOSIS — E039 Hypothyroidism, unspecified: Secondary | ICD-10-CM

## 2022-02-06 DIAGNOSIS — R739 Hyperglycemia, unspecified: Secondary | ICD-10-CM | POA: Diagnosis not present

## 2022-02-06 DIAGNOSIS — D631 Anemia in chronic kidney disease: Secondary | ICD-10-CM

## 2022-02-06 LAB — CBC WITH DIFFERENTIAL (CANCER CENTER ONLY)
Abs Immature Granulocytes: 0.02 10*3/uL (ref 0.00–0.07)
Basophils Absolute: 0 10*3/uL (ref 0.0–0.1)
Basophils Relative: 1 %
Eosinophils Absolute: 0 10*3/uL (ref 0.0–0.5)
Eosinophils Relative: 0 %
HCT: 38.1 % (ref 36.0–46.0)
Hemoglobin: 11.8 g/dL — ABNORMAL LOW (ref 12.0–15.0)
Immature Granulocytes: 0 %
Lymphocytes Relative: 21 %
Lymphs Abs: 1.7 10*3/uL (ref 0.7–4.0)
MCH: 30.3 pg (ref 26.0–34.0)
MCHC: 31 g/dL (ref 30.0–36.0)
MCV: 97.9 fL (ref 80.0–100.0)
Monocytes Absolute: 0.5 10*3/uL (ref 0.1–1.0)
Monocytes Relative: 6 %
Neutro Abs: 5.8 10*3/uL (ref 1.7–7.7)
Neutrophils Relative %: 72 %
Platelet Count: 301 10*3/uL (ref 150–400)
RBC: 3.89 MIL/uL (ref 3.87–5.11)
RDW: 18.2 % — ABNORMAL HIGH (ref 11.5–15.5)
WBC Count: 8 10*3/uL (ref 4.0–10.5)
nRBC: 0 % (ref 0.0–0.2)

## 2022-02-06 LAB — CMP (CANCER CENTER ONLY)
ALT: 8 U/L (ref 0–44)
AST: 14 U/L — ABNORMAL LOW (ref 15–41)
Albumin: 3.4 g/dL — ABNORMAL LOW (ref 3.5–5.0)
Alkaline Phosphatase: 16 U/L — ABNORMAL LOW (ref 38–126)
Anion gap: 7 (ref 5–15)
BUN: 18 mg/dL (ref 8–23)
CO2: 29 mmol/L (ref 22–32)
Calcium: 9.4 mg/dL (ref 8.9–10.3)
Chloride: 101 mmol/L (ref 98–111)
Creatinine: 1 mg/dL (ref 0.44–1.00)
GFR, Estimated: 52 mL/min — ABNORMAL LOW (ref >60)
Glucose, Bld: 86 mg/dL (ref 70–99)
Potassium: 4.5 mmol/L (ref 3.5–5.1)
Sodium: 137 mmol/L (ref 135–145)
Total Bilirubin: 0.4 mg/dL (ref 0.3–1.2)
Total Protein: 7.6 g/dL (ref 6.5–8.1)

## 2022-02-06 LAB — IRON AND IRON BINDING CAPACITY (CC-WL,HP ONLY)
Iron: 132 ug/dL (ref 28–170)
Saturation Ratios: 47 % — ABNORMAL HIGH (ref 10.4–31.8)
TIBC: 283 ug/dL (ref 250–450)
UIBC: 151 ug/dL (ref 148–442)

## 2022-02-06 LAB — FERRITIN: Ferritin: 18 ng/mL (ref 11–307)

## 2022-02-06 MED ORDER — TIZANIDINE HCL 2 MG PO TABS: 1.0000 mg | ORAL_TABLET | Freq: Four times a day (QID) | ORAL | 1 refills | Status: DC | PRN

## 2022-02-06 NOTE — Progress Notes (Signed)
Subjective:   By signing my name below, I, [name], attest that this documentation has been prepared under the direction and in the presence of Mosie Lukes, MD., 02/06/2022.     Patient ID: [name] [name], female    DOB: 01/02/1924, 89 y.o.   MRN: [id number]  Chief Complaint  Patient presents with   Follow-up    Follow up   HPI Patient is in today for an office visit and is accompanied by her daughter. She denies CP/palpitations/SOB/congestion/fever/ chills/GI or GU symptoms.  Muscle Spasms Patient has been experiencing muscle spasms in her neck and shoulders since 12/2021 which have been causing headaches. She was admitted to the ED on 12/29/2021 for this same problem and has been managing the pain with Tylenol. She was also seen by Dr. Larose Kells on 01/30/2022 who made a referral to Dr. Nelva Bush at [facility].  Past Medical History:  Diagnosis Date   Anemia    Anxiety    Arthritis    hips, knees   Current use of long term anticoagulation    Diverticulitis of colon 02/06/2015   Gait difficulty    GERD (gastroesophageal reflux disease)    Headache(784.0) 06/22/2012   Hyperlipidemia    Hypertension    IBS (irritable bowel syndrome)    Incontinence    Loss of weight 06/05/2015   Medicare annual wellness visit, subsequent 02/05/2014   Melena    Mild mitral regurgitation    Mild tricuspid regurgitation    Mitral and aortic regurgitation    Mitral valve prolapse    hx of - not seen on recent echoes   Osteopenia    Paroxysmal atrial flutter ([place])    Pedal edema 10/01/2016   Persistent atrial fibrillation (HCC)    Personal history of colonic polyps    Renal lithiasis 06/26/2015   Sinus bradycardia    Thyroid disease    hypo   Vitamin B12 deficiency 03/16/2016   Vitamin D deficiency     Past Surgical History:  Procedure Laterality Date   BOTOX INJECTION N/A 09/03/2012   Procedure: BOTOX INJECTION;  Surgeon: Inda Castle, MD;  Location: [facility];  Service: Endoscopy;   Laterality: N/A;   BREAST BIOPSY Left    BREAST LUMPECTOMY Right 08/18/2014   Procedure: RIGHT BREAST LUMPECTOMY;  Surgeon: Coralie Keens, MD;  Location: [facility];  Service: General;  Laterality: Right;   CARDIAC ELECTROPHYSIOLOGY MAPPING AND ABLATION     CATARACT EXTRACTION, BILATERAL     ESOPHAGOGASTRODUODENOSCOPY N/A 09/03/2012   Procedure: ESOPHAGOGASTRODUODENOSCOPY (EGD);  Surgeon: Inda Castle, MD;  Location: [facility];  Service: Endoscopy;  Laterality: N/A;   ESOPHAGOGASTRODUODENOSCOPY (EGD) WITH PROPOFOL N/A 12/02/2018   Procedure: ESOPHAGOGASTRODUODENOSCOPY (EGD) WITH PROPOFOL;  Surgeon: [name], MD;  Location: [facility];  Service: Gastroenterology;  Laterality: N/A;   ESOPHAGOSCOPY W/ BOTOX INJECTION     I & D EXTREMITY Right 11/06/2012   Procedure: IRRIGATION AND DEBRIDEMENT EXTREMITY Right Ring Finger;  Surgeon: Tennis Must, MD;  Location: [facility];  Service: Orthopedics;  Laterality: Right;   PARTIAL HYSTERECTOMY     ovaries left in place   skin cancer removal      Family History  Problem Relation Age of Onset   Diabetes Mother    CVA Mother    Stroke Mother    COPD Father    Rheum arthritis Father    Allergies Daughter    COPD Daughter    Stroke Daughter  COPD Daughter        previous smoker   Stroke Maternal Grandfather    Heart disease Maternal Aunt    Heart disease Maternal Uncle    Colon cancer Neg Hx    Colon polyps Neg Hx    Esophageal cancer Neg Hx    Gallbladder disease Neg Hx    Kidney disease Neg Hx    Heart attack Neg Hx    Hypertension Neg Hx     Social History   Socioeconomic History   Marital status: Widowed    Spouse name: Not on file   Number of children: 5   Years of education: Not on file   Highest education level: Not on file  Occupational History   Occupation: retired    Fish farm manager: RETIRED  Tobacco Use   Smoking status: Never   Smokeless tobacco: Never  Vaping Use   Vaping Use: Never used   Substance and Sexual Activity   Alcohol use: No   Drug use: No   Sexual activity: Not Currently    Comment: lives with Daughter, grandson and his family. no dietary restricitons.  Other Topics Concern   Not on file  Social History Narrative   Not on file   Social Determinants of Health   Financial Resource Strain: Low Risk  (12/11/2021)   Overall Financial Resource Strain (CARDIA)    Difficulty of Paying Living Expenses: Not hard at all  Food Insecurity: No Food Insecurity (12/11/2021)   Hunger Vital Sign    Worried About Running Out of Food in the Last Year: Never true    Ran Out of Food in the Last Year: Never true  Transportation Needs: No Transportation Needs (12/11/2021)   PRAPARE - Hydrologist (Medical): No    Lack of Transportation (Non-Medical): No  Physical Activity: Inactive (12/11/2021)   Exercise Vital Sign    Days of Exercise per Week: 0 days    Minutes of Exercise per Session: 0 min  Stress: No Stress Concern Present (12/11/2021)   [place]    Feeling of Stress : Not at all  Social Connections: Socially Isolated (12/11/2021)   Social Connection and Isolation Panel [NHANES]    Frequency of Communication with Friends and Family: Once a week    Frequency of Social Gatherings with Friends and Family: Once a week    Attends Religious Services: Never    Marine scientist or Organizations: No    Attends Archivist Meetings: Never    Marital Status: Widowed  Intimate Partner Violence: Not At Risk (12/11/2021)   Humiliation, Afraid, Rape, and Kick questionnaire    Fear of Current or Ex-Partner: No    Emotionally Abused: No    Physically Abused: No    Sexually Abused: No    Outpatient Medications Prior to Visit  Medication Sig Dispense Refill   ALPRAZolam (XANAX) 0.25 MG tablet TAKE 0.5-1 TABLETS (0.125-0.25 MG TOTAL) BY MOUTH 2 (TWO) TIMES DAILY AS  NEEDED FOR ANXIETY. 30 tablet 1   apixaban (ELIQUIS) 2.5 MG TABS tablet TAKE 1 TABLET BY MOUTH TWICE A DAY 60 tablet 10   Cholecalciferol (VITAMIN D) 2000 UNITS CAPS Take 2,000 Units by mouth every other day.     diclofenac Sodium (VOLTAREN) 1 % GEL Apply 2 g topically 4 (four) times daily. 50 g 0   famotidine (PEPCID) 20 MG tablet Take 20 mg by mouth at bedtime.  Ferrous Fumarate-Folic Acid (HEMOCYTE-F) 324-1 MG TABS Take 1 tablet by mouth daily. 30 tablet 5   furosemide (LASIX) 20 MG tablet Take 1 tablet (20 mg total) by mouth as needed for fluid or edema (swelling or weight gain of 2lbs overnight or 5lbs in a week). 90 tablet 3   isosorbide mononitrate (IMDUR) 30 MG 24 hr tablet Take 1 tablet (30 mg total) by mouth daily. 90 tablet 2   levocetirizine (XYZAL) 5 MG tablet Take 2.5 mg by mouth daily at 12 noon.     lidocaine (LIDODERM) 5 % Place 1 patch onto the skin daily. Remove & Discard patch within 12 hours or as directed by MD 30 patch 0   losartan (COZAAR) 25 MG tablet TAKE 1 TABLET (25 MG TOTAL) BY MOUTH DAILY. 90 tablet 1   metoprolol succinate (TOPROL-XL) 25 MG 24 hr tablet TAKE 1 TABLET (25 MG TOTAL) BY MOUTH DAILY. 90 tablet 1   pantoprazole (PROTONIX) 40 MG tablet Take 1 tablet (40 mg total) by mouth daily. 90 tablet 1   No facility-administered medications prior to visit.    Allergies  Allergen Reactions   Amlodipine Shortness Of Breath   Darifenacin Hydrobromide Other (See Comments)    dizziness   Sulfonamide Derivatives Other (See Comments)    dizziness   Other Other (See Comments)    dizziness   Tramadol Other (See Comments)    Insomnia, anorexia    Review of Systems  Constitutional:  Negative for chills and fever.  HENT:  Negative for congestion.   Respiratory:  Negative for shortness of breath.   Cardiovascular:  Negative for chest pain and palpitations.  Gastrointestinal:  Negative for abdominal pain, blood in stool, constipation, diarrhea, nausea and  vomiting.  Genitourinary:  Negative for dysuria, frequency, hematuria and urgency.  Musculoskeletal:  Positive for neck pain.       (+) bilateral shoulder pain. (+) muscle spasms in the neck and shoulders.  Skin:           Neurological:  Positive for headaches.      Objective:    Physical Exam Constitutional:      General: She is not in acute distress.    Appearance: Normal appearance. She is normal weight. She is not ill-appearing.  HENT:     Head: Normocephalic and atraumatic.     Right Ear: External ear normal.     Left Ear: External ear normal.     Nose: Nose normal.     Mouth/Throat:     Mouth: Mucous membranes are moist.     Pharynx: Oropharynx is clear.  Eyes:     General:        Right eye: No discharge.        Left eye: No discharge.     Extraocular Movements: Extraocular movements intact.     Conjunctiva/sclera: Conjunctivae normal.     Pupils: Pupils are equal, round, and reactive to light.  Cardiovascular:     Rate and Rhythm: Normal rate and regular rhythm.     Pulses: Normal pulses.     Heart sounds: Normal heart sounds. No murmur heard.    No gallop.  Pulmonary:     Effort: Pulmonary effort is normal. No respiratory distress.     Breath sounds: Normal breath sounds. No wheezing or rales.  Abdominal:     General: Bowel sounds are normal.     Palpations: Abdomen is soft.     Tenderness: There is no abdominal tenderness. There is  no guarding.  Musculoskeletal:        General: Normal range of motion.     Cervical back: Normal range of motion.     Right lower leg: No edema.     Left lower leg: No edema.  Skin:    General: Skin is warm and dry.  Neurological:     Mental Status: She is alert and oriented to person, place, and time.  Psychiatric:        Mood and Affect: Mood normal.        Behavior: Behavior normal.        Judgment: Judgment normal.     BP (!) 128/52 (BP Location: Right Arm, Patient Position: Sitting)   Pulse 82   Temp 98 F (36.7  C) (Oral)   Resp 16   Ht 4' 10"$  (1.473 m)   Wt 86 lb 12.8 oz (39.4 kg)   SpO2 95%   BMI 18.14 kg/m  Wt Readings from Last 3 Encounters:  02/06/22 85 lb (38.6 kg)  02/06/22 86 lb 12.8 oz (39.4 kg)  01/30/22 88 lb 8 oz (40.1 kg)    Diabetic Foot Exam - Simple   No data filed    Lab Results  Component Value Date   WBC 8.0 02/06/2022   HGB 11.8 (L) 02/06/2022   HCT 38.1 02/06/2022   PLT 301 02/06/2022   GLUCOSE 86 02/06/2022   CHOL 112 09/18/2021   TRIG 77.0 09/18/2021   HDL 41.40 09/18/2021   LDLDIRECT 72.1 01/19/2009   LDLCALC 55 09/18/2021   ALT 8 02/06/2022   AST 14 (L) 02/06/2022   NA 137 02/06/2022   K 4.5 02/06/2022   CL 101 02/06/2022   CREATININE 1.00 02/06/2022   BUN 18 02/06/2022   CO2 29 02/06/2022   TSH 4.34 09/18/2021   INR 1.3 (H) 09/19/2021   HGBA1C 5.5 10/24/2020    Lab Results  Component Value Date   TSH 4.34 09/18/2021   Lab Results  Component Value Date   WBC 8.0 02/06/2022   HGB 11.8 (L) 02/06/2022   HCT 38.1 02/06/2022   MCV 97.9 02/06/2022   PLT 301 02/06/2022   Lab Results  Component Value Date   NA 137 02/06/2022   K 4.5 02/06/2022   CO2 29 02/06/2022   GLUCOSE 86 02/06/2022   BUN 18 02/06/2022   CREATININE 1.00 02/06/2022   BILITOT 0.4 02/06/2022   ALKPHOS 16 (L) 02/06/2022   AST 14 (L) 02/06/2022   ALT 8 02/06/2022   PROT 7.6 02/06/2022   ALBUMIN 3.4 (L) 02/06/2022   CALCIUM 9.4 02/06/2022   ANIONGAP 7 02/06/2022   EGFR 41 (L) 04/11/2021   GFR 36.94 (L) 09/25/2021   Lab Results  Component Value Date   CHOL 112 09/18/2021   Lab Results  Component Value Date   HDL 41.40 09/18/2021   Lab Results  Component Value Date   LDLCALC 55 09/18/2021   Lab Results  Component Value Date   TRIG 77.0 09/18/2021   Lab Results  Component Value Date   CHOLHDL 3 09/18/2021   Lab Results  Component Value Date   HGBA1C 5.5 10/24/2020      Assessment & Plan:  Muscle Spasms: Encouraged moist heat, stretching, and 2  Tylenol tid. Also prescribed Tizanidine 2 mg and discussed the option of rheumatology. Problem List Items Addressed This Visit     Arthritis    Now struggling with neck, shoulder and back pain and stiffness. Encouraged moist heat and  gentle stretching as tolerated. May try NSAIDs and prescription meds as directed and report if symptoms worsen or seek immediate care. Given a prescription for Tizanidine to try 1-2 mg qhs prn continue Tylenol up to 3000 mg daily.       Relevant Medications   tiZANidine (ZANAFLEX) 2 MG tablet   Essential hypertension - Primary (Chronic)    Well controlled, no changes to meds. Encouraged heart healthy diet such as the DASH diet and exercise as tolerated.        Hyperglycemia    hgba1c acceptable, minimize simple carbs. Increase exercise as tolerated.       Hypothyroidism    On Levothyroxine, continue to monitor       Muscle cramp    Hydrate and monitor       Persistent atrial fibrillation (HCC)    Tolerating Eliquis and rate controlled      Stage 3a chronic kidney disease (CKD) (HCC)    Hydrate and monitor       Vitamin B12 deficiency    Supplement and monitor       Vitamin D deficiency    Supplement and monitor       Meds ordered this encounter  Medications   tiZANidine (ZANAFLEX) 2 MG tablet    Sig: Take 0.5-1 tablets (1-2 mg total) by mouth every 6 (six) hours as needed for muscle spasms.    Dispense:  30 tablet    Refill:  1   I, Penni Homans, MD, personally preformed the services described in this documentation.  All medical record entries made by the scribe were at my direction and in my presence.  I have reviewed the chart and discharge instructions (if applicable) and agree that the record reflects my personal performance and is accurate and complete. 02/06/2022  I,Mohammed Iqbal,acting as a scribe for Penni Homans, MD.,have documented all relevant documentation on the behalf of Penni Homans, MD,as directed by  Penni Homans, MD  while in the presence of Penni Homans, MD.  Penni Homans, MD

## 2022-02-06 NOTE — Patient Instructions (Signed)
Tylenol/Acetaminophen ES 500 mg tabs, 1-2 tabs up to 3 x a day. Max of 6 tabs in 24 hours or 3000 mg per day.     Osteoarthritis  Osteoarthritis is a type of arthritis. It refers to joint pain or joint disease. Osteoarthritis affects tissue that covers the ends of bones in joints (cartilage). Cartilage acts as a cushion between the bones and helps them move smoothly. Osteoarthritis occurs when cartilage in the joints gets worn down. Osteoarthritis is sometimes called "wear and tear" arthritis. Osteoarthritis is the most common form of arthritis. It often occurs in older people. It is a condition that gets worse over time. The joints most often affected by this condition are in the fingers, toes, hips, knees, and spine, including the neck and lower back. What are the causes? This condition is caused by the wearing down of cartilage that covers the ends of bones. What increases the risk? The following factors may make you more likely to develop this condition: Being age 60 or older. Obesity. Overuse of joints. Past injury of a joint. Past surgery on a joint. Family history of osteoarthritis. What are the signs or symptoms? The main symptoms of this condition are pain, swelling, and stiffness in the joint. Other symptoms may include: An enlarged joint. More pain and further damage caused by small pieces of bone or cartilage that break off and float inside of the joint. Small deposits of bone (osteophytes) that grow on the edges of the joint. A grating or scraping feeling inside the joint when you move it. Popping or creaking sounds when you move. Difficulty walking or exercising. An inability to grip items, twist your hand(s), or control the movements of your hands and fingers. How is this diagnosed? This condition may be diagnosed based on: Your medical history. A physical exam. Your symptoms. X-rays of the affected joint(s). Blood tests to rule out other types of arthritis. How is  this treated? There is no cure for this condition, but treatment can help control pain and improve joint function. Treatment may include a combination of therapies, such as: Pain relief techniques, such as: Applying heat and cold to the joint. Massage. A form of talk therapy called cognitive behavioral therapy (CBT). This therapy helps you set goals and follow up on the changes that you make. Medicines for pain and inflammation. The medicines can be taken by mouth or applied to the skin. They include: NSAIDs, such as ibuprofen. Prescription medicines. Strong anti-inflammatory medicines (corticosteroids). Certain nutritional supplements. A prescribed exercise program. You may work with a physical therapist. Assistive devices, such as a brace, wrap, splint, specialized glove, or cane. A weight control plan. Surgery, such as: An osteotomy. This is done to reposition the bones and relieve pain or to remove loose pieces of bone and cartilage. Joint replacement surgery. You may need this surgery if you have advanced osteoarthritis. Follow these instructions at home: Activity Rest your affected joints as told by your health care provider. Exercise as told by your health care provider. He or she may recommend specific types of exercise, such as: Strengthening exercises. These are done to strengthen the muscles that support joints affected by arthritis. Aerobic activities. These are exercises, such as brisk walking or water aerobics, that increase your heart rate. Range-of-motion activities. These help your joints move more easily. Balance and agility exercises. Managing pain, stiffness, and swelling     If directed, apply heat to the affected area as often as told by your health care provider.  Use the heat source that your health care provider recommends, such as a moist heat pack or a heating pad. If you have a removable assistive device, remove it as told by your health care provider. Place  a towel between your skin and the heat source. If your health care provider tells you to keep the assistive device on while you apply heat, place a towel between the assistive device and the heat source. Leave the heat on for 20-30 minutes. Remove the heat if your skin turns bright red. This is especially important if you are unable to feel pain, heat, or cold. You may have a greater risk of getting burned. If directed, put ice on the affected area. To do this: If you have a removable assistive device, remove it as told by your health care provider. Put ice in a plastic bag. Place a towel between your skin and the bag. If your health care provider tells you to keep the assistive device on during icing, place a towel between the assistive device and the bag. Leave the ice on for 20 minutes, 2-3 times a day. Move your fingers or toes often to reduce stiffness and swelling. Raise (elevate) the injured area above the level of your heart while you are sitting or lying down. General instructions Take over-the-counter and prescription medicines only as told by your health care provider. Maintain a healthy weight. Follow instructions from your health care provider for weight control. Do not use any products that contain nicotine or tobacco, such as cigarettes, e-cigarettes, and chewing tobacco. If you need help quitting, ask your health care provider. Use assistive devices as told by your health care provider. Keep all follow-up visits as told by your health care provider. This is important. Where to find more information [employer] of Arthritis and Musculoskeletal and Skin Diseases: www.niams.SouthExposed.es [employer] on Aging: http://kim-miller.com/ American College of Rheumatology: www.rheumatology.org Contact a health care provider if: You have redness, swelling, or a feeling of warmth in a joint that gets worse. You have a fever along with joint or muscle aches. You develop a rash. You have  trouble doing your normal activities. Get help right away if: You have pain that gets worse and is not relieved by pain medicine. Summary Osteoarthritis is a type of arthritis that affects tissue covering the ends of bones in joints (cartilage). This condition is caused by the wearing down of cartilage that covers the ends of bones. The main symptom of this condition is pain, swelling, and stiffness in the joint. There is no cure for this condition, but treatment can help control pain and improve joint function. This information is not intended to replace advice given to you by your health care provider. Make sure you discuss any questions you have with your health care provider. Document Revised: 06/20/2021 Document Reviewed: 12/15/2018 Elsevier Patient Education  [employer].

## 2022-02-06 NOTE — Progress Notes (Signed)
Hematology and Oncology Follow Up Visit  [name] [name] [id number] [birth year] 89 y.o. 02/06/2022   Principle Diagnosis:  Iron deficiency anemia  Erythropoietin deficiency anemia    Current Therapy:        IV iron as indicated  Aranesp 300 mcg SQ to maintain Hgb > 11   Interim History:  [name] [name] is here today with her daughter for follow-up. She is doing well and has no complaints at this time.  She is getting around nicely with her Rolator. No falls or syncope.  Appetite is good. She admits that she could better hydrate throughout the day. Weight is 85 lbs.  Hgb is now up to 11.8! MCV 97, platelets 301 and WBC count 8.0.  No blood loss noted. No bruising or petechiae.  No fever, chills, n/v, cough, rash, dizziness, SOB, chest pain, palpitations, abdominal pain or changes in bowel or bladder habits.  She states that she has a regular headache due to arthritis.  No swelling, tenderness, numbness or tingling in her extremities.   ECOG Performance Status: 1 - Symptomatic but completely ambulatory  Medications:  Allergies as of 02/06/2022       Reactions   Amlodipine Shortness Of Breath   Darifenacin Hydrobromide Other (See Comments)   dizziness   Other Other (See Comments)   dizziness   Sulfonamide Derivatives Other (See Comments)   dizziness   Tramadol Other (See Comments)   Insomnia, anorexia        Medication List        Accurate as of February 06, 2022  1:00 PM. If you have any questions, ask your nurse or doctor.          ALPRAZolam 0.25 MG tablet Commonly known as: XANAX TAKE 0.5-1 TABLETS (0.125-0.25 MG TOTAL) BY MOUTH 2 (TWO) TIMES DAILY AS NEEDED FOR ANXIETY.   diclofenac Sodium 1 % Gel Commonly known as: Voltaren Apply 2 g topically 4 (four) times daily.   Eliquis 2.5 MG Tabs tablet Generic drug: apixaban TAKE 1 TABLET BY MOUTH TWICE A DAY   famotidine 20 MG tablet Commonly known as: PEPCID Take 20 mg by mouth at bedtime.   furosemide 20 MG  tablet Commonly known as: LASIX Take 1 tablet (20 mg total) by mouth as needed for fluid or edema (swelling or weight gain of 2lbs overnight or 5lbs in a week).   Hemocyte-F 324-1 MG Tabs Generic drug: Ferrous Fumarate-Folic Acid Take 1 tablet by mouth daily.   isosorbide mononitrate 30 MG 24 hr tablet Commonly known as: IMDUR Take 1 tablet (30 mg total) by mouth daily.   levocetirizine 5 MG tablet Commonly known as: XYZAL Take 2.5 mg by mouth daily at 12 noon.   lidocaine 5 % Commonly known as: Lidoderm Place 1 patch onto the skin daily. Remove & Discard patch within 12 hours or as directed by MD   losartan 25 MG tablet Commonly known as: COZAAR TAKE 1 TABLET (25 MG TOTAL) BY MOUTH DAILY.   metoprolol succinate 25 MG 24 hr tablet Commonly known as: TOPROL-XL TAKE 1 TABLET (25 MG TOTAL) BY MOUTH DAILY.   pantoprazole 40 MG tablet Commonly known as: PROTONIX Take 1 tablet (40 mg total) by mouth daily.   tiZANidine 2 MG tablet Commonly known as: ZANAFLEX Take 0.5-1 tablets (1-2 mg total) by mouth every 6 (six) hours as needed for muscle spasms. Started by: Penni Homans, MD   Vitamin D 50 MCG (2000 UT) Caps Take 2,000 Units by mouth every other  day.        Allergies:  Allergies  Allergen Reactions   Amlodipine Shortness Of Breath   Darifenacin Hydrobromide Other (See Comments)    dizziness   Other Other (See Comments)    dizziness   Sulfonamide Derivatives Other (See Comments)    dizziness   Tramadol Other (See Comments)    Insomnia, anorexia    Past Medical History, Surgical history, Social history, and Family History were reviewed and updated.  Review of Systems: All other 10 point review of systems is negative.   Physical Exam:  vitals were not taken for this visit.   Wt Readings from Last 3 Encounters:  02/06/22 86 lb 12.8 oz (39.4 kg)  01/30/22 88 lb 8 oz (40.1 kg)  12/29/21 87 lb (39.5 kg)    Ocular: Sclerae unicteric, pupils equal, round and  reactive to light Ear-nose-throat: Oropharynx clear, dentition fair Lymphatic: No cervical or supraclavicular adenopathy Lungs no rales or rhonchi, good excursion bilaterally Heart regular rate and rhythm, no murmur appreciated Abd soft, nontender, positive bowel sounds MSK no focal spinal tenderness, no joint edema Neuro: non-focal, well-oriented, appropriate affect Breasts: Deferred   Lab Results  Component Value Date   WBC 8.0 02/06/2022   HGB 11.8 (L) 02/06/2022   HCT 38.1 02/06/2022   MCV 97.9 02/06/2022   PLT 301 02/06/2022   Lab Results  Component Value Date   FERRITIN 45 12/22/2021   IRON 37 12/22/2021   TIBC 280 12/22/2021   UIBC 243 12/22/2021   IRONPCTSAT 13 12/22/2021   Lab Results  Component Value Date   RETICCTPCT 1.2 12/22/2021   RBC 3.89 02/06/2022   No results found for: "KPAFRELGTCHN", "LAMBDASER", "KAPLAMBRATIO" No results found for: "IGGSERUM", "IGA", "IGMSERUM" No results found for: "TOTALPROTELP", "ALBUMINELP", "A1GS", "A2GS", "BETS", "BETA2SER", "GAMS", "MSPIKE", "SPEI"   Chemistry      Component Value Date/Time   NA 138 01/23/2022 1411   NA 139 04/11/2021 1312   K 5.0 01/23/2022 1411   CL 102 01/23/2022 1411   CO2 28 01/23/2022 1411   BUN 26 (H) 01/23/2022 1411   BUN 30 04/11/2021 1312   CREATININE 1.47 (H) 01/23/2022 1411   CREATININE 1.15 (H) 10/29/2019 1358      Component Value Date/Time   CALCIUM 9.5 01/23/2022 1411   ALKPHOS 16 (L) 01/23/2022 1411   AST 12 (L) 01/23/2022 1411   ALT 8 01/23/2022 1411   BILITOT 0.3 01/23/2022 1411       Impression and Plan: [name] [name] is a very pleasant 89 yo caucasian female with multifactorial anemia (IDA and erythropoietin deficiency).  Lab and injection every 3 weeks, follow-up in 6 weeks.   [name] Dawson, NP 2/6/20241:00 PM

## 2022-02-11 NOTE — Assessment & Plan Note (Signed)
Supplement and monitor 

## 2022-02-11 NOTE — Assessment & Plan Note (Signed)
Hydrate and monitor 

## 2022-02-11 NOTE — Assessment & Plan Note (Signed)
Well controlled, no changes to meds. Encouraged heart healthy diet such as the DASH diet and exercise as tolerated.  

## 2022-02-11 NOTE — Assessment & Plan Note (Signed)
Tolerating Eliquis and rate controlled

## 2022-02-11 NOTE — Assessment & Plan Note (Signed)
On Levothyroxine, continue to monitor 

## 2022-02-11 NOTE — Assessment & Plan Note (Signed)
hgba1c acceptable, minimize simple carbs. Increase exercise as tolerated.  

## 2022-02-11 NOTE — Assessment & Plan Note (Signed)
Now struggling with neck, shoulder and back pain and stiffness. Encouraged moist heat and gentle stretching as tolerated. May try NSAIDs and prescription meds as directed and report if symptoms worsen or seek immediate care. Given a prescription for Tizanidine to try 1-2 mg qhs prn continue Tylenol up to 3000 mg daily.

## 2022-02-12 ENCOUNTER — Other Ambulatory Visit: Payer: Self-pay | Admitting: Family Medicine

## 2022-02-12 ENCOUNTER — Other Ambulatory Visit: Payer: Self-pay

## 2022-02-12 DIAGNOSIS — R079 Chest pain, unspecified: Secondary | ICD-10-CM

## 2022-02-12 MED ORDER — ISOSORBIDE MONONITRATE ER 30 MG PO TB24: 30.0000 mg | ORAL_TABLET | Freq: Every day | ORAL | 2 refills | Status: DC

## 2022-02-27 ENCOUNTER — Inpatient Hospital Stay: Payer: Medicare Other

## 2022-02-27 VITALS — BP 143/55 | HR 88 | Temp 98.2°F | Resp 18

## 2022-02-27 DIAGNOSIS — Z7901 Long term (current) use of anticoagulants: Secondary | ICD-10-CM

## 2022-02-27 DIAGNOSIS — D500 Iron deficiency anemia secondary to blood loss (chronic): Secondary | ICD-10-CM

## 2022-02-27 DIAGNOSIS — D509 Iron deficiency anemia, unspecified: Secondary | ICD-10-CM | POA: Diagnosis not present

## 2022-02-27 DIAGNOSIS — D631 Anemia in chronic kidney disease: Secondary | ICD-10-CM

## 2022-02-27 DIAGNOSIS — N1831 Chronic kidney disease, stage 3a: Secondary | ICD-10-CM | POA: Diagnosis not present

## 2022-02-27 LAB — CBC WITH DIFFERENTIAL (CANCER CENTER ONLY)
Abs Immature Granulocytes: 0.03 10*3/uL (ref 0.00–0.07)
Basophils Absolute: 0 10*3/uL (ref 0.0–0.1)
Basophils Relative: 1 %
Eosinophils Absolute: 0 10*3/uL (ref 0.0–0.5)
Eosinophils Relative: 0 %
HCT: 30.7 % — ABNORMAL LOW (ref 36.0–46.0)
Hemoglobin: 9.5 g/dL — ABNORMAL LOW (ref 12.0–15.0)
Immature Granulocytes: 0 %
Lymphocytes Relative: 20 %
Lymphs Abs: 1.6 10*3/uL (ref 0.7–4.0)
MCH: 30.2 pg (ref 26.0–34.0)
MCHC: 30.9 g/dL (ref 30.0–36.0)
MCV: 97.5 fL (ref 80.0–100.0)
Monocytes Absolute: 0.4 10*3/uL (ref 0.1–1.0)
Monocytes Relative: 5 %
Neutro Abs: 5.8 10*3/uL (ref 1.7–7.7)
Neutrophils Relative %: 74 %
Platelet Count: 278 10*3/uL (ref 150–400)
RBC: 3.15 MIL/uL — ABNORMAL LOW (ref 3.87–5.11)
RDW: 16.7 % — ABNORMAL HIGH (ref 11.5–15.5)
WBC Count: 7.8 10*3/uL (ref 4.0–10.5)
nRBC: 0 % (ref 0.0–0.2)

## 2022-02-27 LAB — CMP (CANCER CENTER ONLY)
ALT: 8 U/L (ref 0–44)
AST: 14 U/L — ABNORMAL LOW (ref 15–41)
Albumin: 3 g/dL — ABNORMAL LOW (ref 3.5–5.0)
Alkaline Phosphatase: 15 U/L — ABNORMAL LOW (ref 38–126)
Anion gap: 7 (ref 5–15)
BUN: 20 mg/dL (ref 8–23)
CO2: 27 mmol/L (ref 22–32)
Calcium: 8.5 mg/dL — ABNORMAL LOW (ref 8.9–10.3)
Chloride: 103 mmol/L (ref 98–111)
Creatinine: 1.03 mg/dL — ABNORMAL HIGH (ref 0.44–1.00)
GFR, Estimated: 50 mL/min — ABNORMAL LOW (ref >60)
Glucose, Bld: 133 mg/dL — ABNORMAL HIGH (ref 70–99)
Potassium: 3.6 mmol/L (ref 3.5–5.1)
Sodium: 137 mmol/L (ref 135–145)
Total Bilirubin: 0.3 mg/dL (ref 0.3–1.2)
Total Protein: 6.3 g/dL — ABNORMAL LOW (ref 6.5–8.1)

## 2022-02-27 MED ORDER — DARBEPOETIN ALFA 300 MCG/0.6ML IJ SOSY
300.0000 ug | PREFILLED_SYRINGE | Freq: Once | INTRAMUSCULAR | Status: AC
  Administered 2022-02-27: 300 ug via SUBCUTANEOUS
  Filled 2022-02-27: qty 0.6

## 2022-02-27 NOTE — Patient Instructions (Signed)

## 2022-03-05 ENCOUNTER — Ambulatory Visit (INDEPENDENT_AMBULATORY_CARE_PROVIDER_SITE_OTHER): Payer: Medicare Other | Admitting: Family Medicine

## 2022-03-05 ENCOUNTER — Encounter: Payer: Self-pay | Admitting: Family Medicine

## 2022-03-05 VITALS — BP 124/80 | HR 93 | Temp 97.6°F | Ht 59.0 in | Wt 91.2 lb

## 2022-03-05 DIAGNOSIS — R1032 Left lower quadrant pain: Secondary | ICD-10-CM | POA: Diagnosis not present

## 2022-03-05 MED ORDER — AMOXICILLIN-POT CLAVULANATE 875-125 MG PO TABS: 1.0000 | ORAL_TABLET | Freq: Two times a day (BID) | ORAL | 0 refills | Status: DC

## 2022-03-05 NOTE — Progress Notes (Signed)
Chief Complaint  Patient presents with   Abdominal Pain    Hurts to get up and down. Painful urinating and having BM"s Having pain for several days    [name] [name] is here for abdominal pain. Here w daughter who helps w hx.   Duration: 2 weeks Nighttime awakenings? No Bleeding? Yes- being managed symptomatically Weight loss? Yes Palliation: staying still Provocation: movement No pain in genitalia or burning w urination, dc, pain over bottom.  Associated symptoms:  None Has 2 BM's daily, reports they are soft.  Denies: fever, nausea, vomiting, and inability to keep down fluids Treatment to date: Tylenol  Past Medical History:  Diagnosis Date   Anemia    Anxiety    Arthritis    hips, knees   Current use of long term anticoagulation    Diverticulitis of colon 02/06/2015   Gait difficulty    GERD (gastroesophageal reflux disease)    Headache(784.0) 06/22/2012   Hyperlipidemia    Hypertension    IBS (irritable bowel syndrome)    Incontinence    Loss of weight 06/05/2015   Medicare annual wellness visit, subsequent 02/05/2014   Melena    Mild mitral regurgitation    Mild tricuspid regurgitation    Mitral and aortic regurgitation    Mitral valve prolapse    hx of - not seen on recent echoes   Osteopenia    Paroxysmal atrial flutter ([place])    Pedal edema 10/01/2016   Persistent atrial fibrillation (H[facility])    Personal history of colonic polyps    Renal lithiasis 06/26/2015   Sinus bradycardia    Thyroid disease    hypo   Vitamin B12 deficiency 03/16/2016   Vitamin D deficiency     BP 124/80 (BP Location: Left Arm, Patient Position: Sitting, Cuff Size: Normal)   Pulse 93   Temp 97.6 F (36.4 C) (Oral)   Ht '4\' 11"'$  (1.499 m)   Wt 91 lb 4 oz (41.4 kg)   SpO2 95%   BMI 18.43 kg/m  Gen.: Awake, alert, appears stated age 89: Mucous membranes moist without mucosal lesions Heart: Regular rate, irreg irreg rhythm Lungs: Clear auscultation bilaterally, no rales or wheezing,  normal effort without accessory muscle use. Abdomen: Bowel sounds are present. Abdomen is soft, TTP in LLQ, nondistended, no masses or organomegaly. Negative Murphy's, Rovsing's, McBurney's, and Carnett's sign. Psych: Normal mood and affect.  Left lower quadrant abdominal pain - Plan: amoxicillin-clavulanate (AUGMENTIN) 875-125 MG tablet  Will empirically tx for diverticulitis. Not improving after >1 week. 10 d of Augmentin as above. Will send message in 2 d. She has a hx of GI bleeding, not scoped 2/2 age. Would ck CT abd/pelv w contrast if still having issues. Tylenol prn, ER if worsening.  F/u prn Pt and her daughter voiced understanding and agreement to the plan.  [place], DO 03/05/22 4:22 PM

## 2022-03-05 NOTE — Patient Instructions (Signed)
OK to take Tylenol 1000 mg (2 extra strength tabs) or 975 mg (3 regular strength tabs) every 6 hours as needed.  Stay hydrated.   Send me a message Wednesday afternoon or Thursday morning if not improving.   Let us know if you need anything.

## 2022-03-08 ENCOUNTER — Telehealth: Payer: Self-pay

## 2022-03-08 ENCOUNTER — Other Ambulatory Visit: Payer: Self-pay | Admitting: Family Medicine

## 2022-03-08 DIAGNOSIS — R1032 Left lower quadrant pain: Secondary | ICD-10-CM

## 2022-03-08 NOTE — Telephone Encounter (Signed)
So outside of the diarrhea, is she improving? That is a side effect of the antibiotic also.

## 2022-03-08 NOTE — Telephone Encounter (Signed)
Spoke w/ Izora Gala- pain hasn't improved but has not worsened. Pt had not taken abx today, instructed her to have Pt take it, recommended OTC imodium or pepto bismol prn for diarrhea. Pt's daughter is worried because pain isn't improving.

## 2022-03-08 NOTE — Telephone Encounter (Signed)
Spoke w/ Izora Gala (daughter)- she informed that Pt was seen on 3/4 and was instructed by Dr. Nani Ravens to call back if she becomes worse. Izora Gala informed that she is now having diarrhea they think from Augmentin that was given to her. Informed that Dr. Nani Ravens is out of the office but typically does check his basket. Izora Gala verbalized understanding.   Cb [phone]

## 2022-03-08 NOTE — Telephone Encounter (Signed)
Left message on machine to call back  

## 2022-03-09 ENCOUNTER — Ambulatory Visit (HOSPITAL_BASED_OUTPATIENT_CLINIC_OR_DEPARTMENT_OTHER): Admission: RE | Admit: 2022-03-09 | Payer: Medicare Other | Source: Ambulatory Visit

## 2022-03-09 NOTE — Telephone Encounter (Signed)
Informed the daughter of PCP response/instructions. Which they are doing. Did contact [facility] to take care of getting this approved/scheduled asap. Daughter informed

## 2022-03-09 NOTE — Telephone Encounter (Signed)
Called left message to call back 

## 2022-03-12 ENCOUNTER — Ambulatory Visit (HOSPITAL_BASED_OUTPATIENT_CLINIC_OR_DEPARTMENT_OTHER)
Admission: RE | Admit: 2022-03-12 | Discharge: 2022-03-12 | Disposition: A | Discharge status: Home / Self Care | Payer: Medicare Other | Source: Ambulatory Visit | Attending: Family Medicine | Admitting: Family Medicine

## 2022-03-12 ENCOUNTER — Encounter (HOSPITAL_BASED_OUTPATIENT_CLINIC_OR_DEPARTMENT_OTHER): Payer: Self-pay

## 2022-03-12 DIAGNOSIS — K5730 Diverticulosis of large intestine without perforation or abscess without bleeding: Secondary | ICD-10-CM | POA: Diagnosis not present

## 2022-03-12 DIAGNOSIS — R1032 Left lower quadrant pain: Secondary | ICD-10-CM | POA: Diagnosis not present

## 2022-03-12 MED ORDER — IOHEXOL 300 MG/ML  SOLN
100.0000 mL | Freq: Once | INTRAMUSCULAR | Status: AC | PRN
  Administered 2022-03-12: 90 mL via INTRAVENOUS

## 2022-03-20 ENCOUNTER — Inpatient Hospital Stay: Payer: Medicare Other | Attending: Hematology & Oncology | Admitting: Family

## 2022-03-20 ENCOUNTER — Other Ambulatory Visit: Payer: Self-pay

## 2022-03-20 ENCOUNTER — Other Ambulatory Visit: Payer: Self-pay | Admitting: Family

## 2022-03-20 ENCOUNTER — Other Ambulatory Visit: Payer: Self-pay | Admitting: *Deleted

## 2022-03-20 ENCOUNTER — Encounter: Payer: Self-pay | Admitting: Family

## 2022-03-20 ENCOUNTER — Inpatient Hospital Stay: Payer: Medicare Other

## 2022-03-20 VITALS — BP 161/54 | HR 63 | Temp 97.9°F | Resp 18 | Ht 59.0 in | Wt 89.0 lb

## 2022-03-20 DIAGNOSIS — D500 Iron deficiency anemia secondary to blood loss (chronic): Secondary | ICD-10-CM

## 2022-03-20 DIAGNOSIS — Z7901 Long term (current) use of anticoagulants: Secondary | ICD-10-CM

## 2022-03-20 DIAGNOSIS — D631 Anemia in chronic kidney disease: Secondary | ICD-10-CM

## 2022-03-20 DIAGNOSIS — N1831 Chronic kidney disease, stage 3a: Secondary | ICD-10-CM | POA: Insufficient documentation

## 2022-03-20 DIAGNOSIS — I4819 Other persistent atrial fibrillation: Secondary | ICD-10-CM

## 2022-03-20 LAB — CMP (CANCER CENTER ONLY)
ALT: 9 U/L (ref 0–44)
AST: 14 U/L — ABNORMAL LOW (ref 15–41)
Albumin: 3.6 g/dL (ref 3.5–5.0)
Alkaline Phosphatase: 13 U/L — ABNORMAL LOW (ref 38–126)
Anion gap: 8 (ref 5–15)
BUN: 20 mg/dL (ref 8–23)
CO2: 28 mmol/L (ref 22–32)
Calcium: 9.1 mg/dL (ref 8.9–10.3)
Chloride: 103 mmol/L (ref 98–111)
Creatinine: 1 mg/dL (ref 0.44–1.00)
GFR, Estimated: 52 mL/min — ABNORMAL LOW (ref >60)
Glucose, Bld: 75 mg/dL (ref 70–99)
Potassium: 3.9 mmol/L (ref 3.5–5.1)
Sodium: 139 mmol/L (ref 135–145)
Total Bilirubin: 0.3 mg/dL (ref 0.3–1.2)
Total Protein: 7.1 g/dL (ref 6.5–8.1)

## 2022-03-20 LAB — FERRITIN: Ferritin: 21 ng/mL (ref 11–307)

## 2022-03-20 LAB — CBC WITH DIFFERENTIAL (CANCER CENTER ONLY)
Abs Immature Granulocytes: 0.01 10*3/uL (ref 0.00–0.07)
Basophils Absolute: 0 10*3/uL (ref 0.0–0.1)
Basophils Relative: 1 %
Eosinophils Absolute: 0 10*3/uL (ref 0.0–0.5)
Eosinophils Relative: 0 %
HCT: 31.3 % — ABNORMAL LOW (ref 36.0–46.0)
Hemoglobin: 9.7 g/dL — ABNORMAL LOW (ref 12.0–15.0)
Immature Granulocytes: 0 %
Lymphocytes Relative: 34 %
Lymphs Abs: 1.9 10*3/uL (ref 0.7–4.0)
MCH: 31.1 pg (ref 26.0–34.0)
MCHC: 31 g/dL (ref 30.0–36.0)
MCV: 100.3 fL — ABNORMAL HIGH (ref 80.0–100.0)
Monocytes Absolute: 0.3 10*3/uL (ref 0.1–1.0)
Monocytes Relative: 6 %
Neutro Abs: 3.2 10*3/uL (ref 1.7–7.7)
Neutrophils Relative %: 59 %
Platelet Count: 216 10*3/uL (ref 150–400)
RBC: 3.12 MIL/uL — ABNORMAL LOW (ref 3.87–5.11)
RDW: 17.4 % — ABNORMAL HIGH (ref 11.5–15.5)
WBC Count: 5.5 10*3/uL (ref 4.0–10.5)
nRBC: 0 % (ref 0.0–0.2)

## 2022-03-20 LAB — IRON AND IRON BINDING CAPACITY (CC-WL,HP ONLY)
Iron: 328 ug/dL — ABNORMAL HIGH (ref 28–170)
Saturation Ratios: 96 % — ABNORMAL HIGH (ref 10.4–31.8)
TIBC: 343 ug/dL (ref 250–450)
UIBC: 15 ug/dL — ABNORMAL LOW (ref 148–442)

## 2022-03-20 MED ORDER — DARBEPOETIN ALFA 300 MCG/0.6ML IJ SOSY
300.0000 ug | PREFILLED_SYRINGE | Freq: Once | INTRAMUSCULAR | Status: AC
  Administered 2022-03-20: 300 ug via SUBCUTANEOUS
  Filled 2022-03-20: qty 0.6

## 2022-03-20 MED ORDER — APIXABAN 2.5 MG PO TABS: 2.5000 mg | ORAL_TABLET | Freq: Two times a day (BID) | ORAL | 5 refills | Status: DC

## 2022-03-20 NOTE — Telephone Encounter (Signed)
Prescription refill request for Eliquis received. Indication: AF Last office visit: 04/20/21  Gwyndolyn Kaufman MD Scr: 1.03 on 02/27/22 Age: 89 Weight: 37.4kg  Based on above findings Eliquis 2.5mg  twice daily is the appropriate dose.  Refill approved.

## 2022-03-20 NOTE — Progress Notes (Signed)
Hematology and Oncology Follow Up Visit  JOSHUA AMBRIZ [id number] [birth year] 89 y.o. 03/20/2022   Principle Diagnosis:  Iron deficiency anemia  Erythropoietin deficiency anemia    Current Therapy:        IV iron as indicated  Aranesp 300 mcg SQ to maintain Hgb > 11   Interim History:  Ms. Gonthier is here today with her daughter for follow-up. Hgb is 9.7, MCV 100.  She is doing well overall but has noted the occasional blood in her stool. No other blood loss noted. No abnormal bruising, no petechiae.  No fever, chills, n/v, cough, rash, dizziness, SOB, chest pain, abdominal pain or changes in bowel or bladder habits.  Occasional palpitations with atrial fib.  Mild swelling in her ankles. Pedal pulses are 1+.  No numbness or tingling in her extremities.  No falls or syncope.  Appetite and hydration are good. Weight is stable at 89 lbs.   ECOG Performance Status: 1 - Symptomatic but completely ambulatory  Medications:  Allergies as of 03/20/2022       Reactions   Amlodipine Shortness Of Breath   Darifenacin Hydrobromide Other (See Comments)   dizziness   Sulfonamide Derivatives Other (See Comments)   dizziness   Other Other (See Comments)   dizziness   Tramadol Other (See Comments)   Insomnia, anorexia        Medication List        Accurate as of March 20, 2022  1:46 PM. If you have any questions, ask your nurse or doctor.          STOP taking these medications    amoxicillin-clavulanate 875-125 MG tablet Commonly known as: AUGMENTIN Stopped by: Lottie Dawson, NP       TAKE these medications    ALPRAZolam 0.25 MG tablet Commonly known as: XANAX TAKE 0.5-1 TABLETS (0.125-0.25 MG TOTAL) BY MOUTH 2 (TWO) TIMES DAILY AS NEEDED FOR ANXIETY.   apixaban 2.5 MG Tabs tablet Commonly known as: Eliquis Take 1 tablet (2.5 mg total) by mouth 2 (two) times daily. What changed: how much to take Changed by: Edrick Oh, RN   diclofenac Sodium 1 % Gel Commonly  known as: Voltaren Apply 2 g topically 4 (four) times daily.   famotidine 20 MG tablet Commonly known as: PEPCID Take 20 mg by mouth at bedtime.   furosemide 20 MG tablet Commonly known as: LASIX Take 1 tablet (20 mg total) by mouth as needed for fluid or edema (swelling or weight gain of 2lbs overnight or 5lbs in a week).   Hemocyte-F 324-1 MG Tabs Generic drug: Ferrous Fumarate-Folic Acid Take 1 tablet by mouth daily.   isosorbide mononitrate 30 MG 24 hr tablet Commonly known as: IMDUR Take 1 tablet (30 mg total) by mouth daily.   levocetirizine 5 MG tablet Commonly known as: XYZAL Take 2.5 mg by mouth daily at 12 noon.   lidocaine 5 % Commonly known as: Lidoderm Place 1 patch onto the skin daily. Remove & Discard patch within 12 hours or as directed by MD   losartan 25 MG tablet Commonly known as: COZAAR TAKE 1 TABLET (25 MG TOTAL) BY MOUTH DAILY.   metoprolol succinate 25 MG 24 hr tablet Commonly known as: TOPROL-XL TAKE 1 TABLET (25 MG TOTAL) BY MOUTH DAILY.   pantoprazole 40 MG tablet Commonly known as: PROTONIX TAKE 1 TABLET BY MOUTH EVERY DAY   tiZANidine 2 MG tablet Commonly known as: ZANAFLEX Take 0.5-1 tablets (1-2 mg total) by mouth every 6 (  six) hours as needed for muscle spasms.   Vitamin D 50 MCG (2000 UT) Caps Take 2,000 Units by mouth every other day.        Allergies:  Allergies  Allergen Reactions   Amlodipine Shortness Of Breath   Darifenacin Hydrobromide Other (See Comments)    dizziness   Sulfonamide Derivatives Other (See Comments)    dizziness   Other Other (See Comments)    dizziness   Tramadol Other (See Comments)    Insomnia, anorexia    Past Medical History, Surgical history, Social history, and Family History were reviewed and updated.  Review of Systems: All other 10 point review of systems is negative.   Physical Exam:  height is 4\' 11"  (1.499 m) and weight is 89 lb (40.4 kg). Her oral temperature is 97.9 F (36.6  C). Her blood pressure is 161/54 (abnormal) and her pulse is 63. Her respiration is 18 and oxygen saturation is 97%.   Wt Readings from Last 3 Encounters:  03/20/22 89 lb (40.4 kg)  03/05/22 91 lb 4 oz (41.4 kg)  02/06/22 85 lb (38.6 kg)    Ocular: Sclerae unicteric, pupils equal, round and reactive to light Ear-nose-throat: Oropharynx clear, dentition fair Lymphatic: No cervical or supraclavicular adenopathy Lungs no rales or rhonchi, good excursion bilaterally Heart regular rate and rhythm, no murmur appreciated Abd soft, nontender, positive bowel sounds MSK no focal spinal tenderness, no joint edema Neuro: non-focal, well-oriented, appropriate affect Breasts: Deferred   Lab Results  Component Value Date   WBC 5.5 03/20/2022   HGB 9.7 (L) 03/20/2022   HCT 31.3 (L) 03/20/2022   MCV 100.3 (H) 03/20/2022   PLT 216 03/20/2022   Lab Results  Component Value Date   FERRITIN 18 02/06/2022   IRON 132 02/06/2022   TIBC 283 02/06/2022   UIBC 151 02/06/2022   IRONPCTSAT 47 (H) 02/06/2022   Lab Results  Component Value Date   RETICCTPCT 1.2 12/22/2021   RBC 3.12 (L) 03/20/2022   No results found for: "KPAFRELGTCHN", "LAMBDASER", "KAPLAMBRATIO" No results found for: "IGGSERUM", "IGA", "IGMSERUM" No results found for: "TOTALPROTELP", "ALBUMINELP", "A1GS", "A2GS", "BETS", "BETA2SER", "GAMS", "MSPIKE", "SPEI"   Chemistry      Component Value Date/Time   NA 137 02/27/2022 1116   NA 139 04/11/2021 1312   K 3.6 02/27/2022 1116   CL 103 02/27/2022 1116   CO2 27 02/27/2022 1116   BUN 20 02/27/2022 1116   BUN 30 04/11/2021 1312   CREATININE 1.03 (H) 02/27/2022 1116   CREATININE 1.15 (H) 10/29/2019 1358      Component Value Date/Time   CALCIUM 8.5 (L) 02/27/2022 1116   ALKPHOS 15 (L) 02/27/2022 1116   AST 14 (L) 02/27/2022 1116   ALT 8 02/27/2022 1116   BILITOT 0.3 02/27/2022 1116       Impression and Plan: Ms. Vater is a very pleasant 89 yo caucasian female with  multifactorial anemia (IDA and erythropoietin deficiency).  Lab and injection every 3 weeks, follow-up in 6 weeks.   Lottie Dawson, NP 3/19/20241:46 PM

## 2022-03-26 ENCOUNTER — Telehealth: Payer: Self-pay | Admitting: Cardiology

## 2022-03-26 ENCOUNTER — Other Ambulatory Visit: Payer: Self-pay | Admitting: *Deleted

## 2022-03-26 NOTE — Telephone Encounter (Signed)
 *  STAT* If patient is at the pharmacy, call can be transferred to refill team.   1. Which medications need to be refilled? (please list name of each medication and dose if known) losartan (COZAAR) 25 MG tablet   2. Which pharmacy/location (including street and city if local pharmacy) is medication to be sent to? [id number] - [place], [place] [address]    3. Do they need a 30 day or 90 day supply? 90 days  Pt is out of meds needs refill today

## 2022-03-26 NOTE — Telephone Encounter (Signed)
Patient's daughter called to schedule an overdue appointment to be seen before getting refills

## 2022-03-27 MED ORDER — LOSARTAN POTASSIUM 25 MG PO TABS: 25.0000 mg | ORAL_TABLET | Freq: Every day | ORAL | 1 refills | Status: DC

## 2022-03-27 NOTE — Telephone Encounter (Signed)
Refill for [facility]artan has been sent to [facility].

## 2022-04-10 ENCOUNTER — Inpatient Hospital Stay: Payer: Medicare Other

## 2022-04-10 ENCOUNTER — Inpatient Hospital Stay: Payer: Medicare Other | Attending: Hematology & Oncology

## 2022-04-10 VITALS — BP 173/63 | HR 61 | Temp 97.8°F | Resp 17

## 2022-04-10 DIAGNOSIS — N1831 Chronic kidney disease, stage 3a: Secondary | ICD-10-CM | POA: Diagnosis not present

## 2022-04-10 DIAGNOSIS — D631 Anemia in chronic kidney disease: Secondary | ICD-10-CM | POA: Insufficient documentation

## 2022-04-10 DIAGNOSIS — D500 Iron deficiency anemia secondary to blood loss (chronic): Secondary | ICD-10-CM

## 2022-04-10 DIAGNOSIS — Z7901 Long term (current) use of anticoagulants: Secondary | ICD-10-CM

## 2022-04-10 LAB — CMP (CANCER CENTER ONLY)
ALT: 7 U/L (ref 0–44)
AST: 13 U/L — ABNORMAL LOW (ref 15–41)
Albumin: 3.3 g/dL — ABNORMAL LOW (ref 3.5–5.0)
Alkaline Phosphatase: 16 U/L — ABNORMAL LOW (ref 38–126)
Anion gap: 6 (ref 5–15)
BUN: 21 mg/dL (ref 8–23)
CO2: 31 mmol/L (ref 22–32)
Calcium: 9.1 mg/dL (ref 8.9–10.3)
Chloride: 104 mmol/L (ref 98–111)
Creatinine: 1.18 mg/dL — ABNORMAL HIGH (ref 0.44–1.00)
GFR, Estimated: 42 mL/min — ABNORMAL LOW (ref >60)
Glucose, Bld: 104 mg/dL — ABNORMAL HIGH (ref 70–99)
Potassium: 4.7 mmol/L (ref 3.5–5.1)
Sodium: 141 mmol/L (ref 135–145)
Total Bilirubin: 0.3 mg/dL (ref 0.3–1.2)
Total Protein: 7.1 g/dL (ref 6.5–8.1)

## 2022-04-10 LAB — IRON AND IRON BINDING CAPACITY (CC-WL,HP ONLY)
Iron: 36 ug/dL (ref 28–170)
Saturation Ratios: 12 % (ref 10.4–31.8)
TIBC: 301 ug/dL (ref 250–450)
UIBC: 265 ug/dL (ref 148–442)

## 2022-04-10 LAB — CBC WITH DIFFERENTIAL (CANCER CENTER ONLY)
Abs Immature Granulocytes: 0.02 10*3/uL (ref 0.00–0.07)
Basophils Absolute: 0 10*3/uL (ref 0.0–0.1)
Basophils Relative: 0 %
Eosinophils Absolute: 0 10*3/uL (ref 0.0–0.5)
Eosinophils Relative: 0 %
HCT: 35.9 % — ABNORMAL LOW (ref 36.0–46.0)
Hemoglobin: 10.8 g/dL — ABNORMAL LOW (ref 12.0–15.0)
Immature Granulocytes: 0 %
Lymphocytes Relative: 23 %
Lymphs Abs: 1.6 10*3/uL (ref 0.7–4.0)
MCH: 29.8 pg (ref 26.0–34.0)
MCHC: 30.1 g/dL (ref 30.0–36.0)
MCV: 99.2 fL (ref 80.0–100.0)
Monocytes Absolute: 0.4 10*3/uL (ref 0.1–1.0)
Monocytes Relative: 5 %
Neutro Abs: 4.8 10*3/uL (ref 1.7–7.7)
Neutrophils Relative %: 72 %
Platelet Count: 227 10*3/uL (ref 150–400)
RBC: 3.62 MIL/uL — ABNORMAL LOW (ref 3.87–5.11)
RDW: 14.3 % (ref 11.5–15.5)
WBC Count: 6.8 10*3/uL (ref 4.0–10.5)
nRBC: 0 % (ref 0.0–0.2)

## 2022-04-10 LAB — FERRITIN: Ferritin: 23 ng/mL (ref 11–307)

## 2022-04-10 MED ORDER — DARBEPOETIN ALFA 300 MCG/0.6ML IJ SOSY
300.0000 ug | PREFILLED_SYRINGE | Freq: Once | INTRAMUSCULAR | Status: AC
  Administered 2022-04-10: 300 ug via SUBCUTANEOUS
  Filled 2022-04-10: qty 0.6

## 2022-04-10 NOTE — Patient Instructions (Signed)

## 2022-05-02 ENCOUNTER — Encounter: Payer: Self-pay | Admitting: Family

## 2022-05-02 ENCOUNTER — Inpatient Hospital Stay: Payer: Medicare Other

## 2022-05-02 ENCOUNTER — Other Ambulatory Visit: Payer: Self-pay

## 2022-05-02 ENCOUNTER — Inpatient Hospital Stay: Payer: Medicare Other | Attending: Hematology & Oncology | Admitting: Family

## 2022-05-02 VITALS — BP 155/65 | HR 56 | Temp 97.6°F | Resp 17 | Ht 59.0 in | Wt 85.0 lb

## 2022-05-02 DIAGNOSIS — D500 Iron deficiency anemia secondary to blood loss (chronic): Secondary | ICD-10-CM

## 2022-05-02 DIAGNOSIS — Z7901 Long term (current) use of anticoagulants: Secondary | ICD-10-CM

## 2022-05-02 DIAGNOSIS — N1831 Chronic kidney disease, stage 3a: Secondary | ICD-10-CM | POA: Insufficient documentation

## 2022-05-02 DIAGNOSIS — D631 Anemia in chronic kidney disease: Secondary | ICD-10-CM

## 2022-05-02 DIAGNOSIS — D509 Iron deficiency anemia, unspecified: Secondary | ICD-10-CM | POA: Diagnosis not present

## 2022-05-02 LAB — CMP (CANCER CENTER ONLY)
ALT: 8 U/L (ref 0–44)
AST: 11 U/L — ABNORMAL LOW (ref 15–41)
Albumin: 3.3 g/dL — ABNORMAL LOW (ref 3.5–5.0)
Alkaline Phosphatase: 17 U/L — ABNORMAL LOW (ref 38–126)
Anion gap: 7 (ref 5–15)
BUN: 21 mg/dL (ref 8–23)
CO2: 30 mmol/L (ref 22–32)
Calcium: 8.7 mg/dL — ABNORMAL LOW (ref 8.9–10.3)
Chloride: 105 mmol/L (ref 98–111)
Creatinine: 1.05 mg/dL — ABNORMAL HIGH (ref 0.44–1.00)
GFR, Estimated: 49 mL/min — ABNORMAL LOW (ref >60)
Glucose, Bld: 99 mg/dL (ref 70–99)
Potassium: 4.5 mmol/L (ref 3.5–5.1)
Sodium: 142 mmol/L (ref 135–145)
Total Bilirubin: 0.3 mg/dL (ref 0.3–1.2)
Total Protein: 7.4 g/dL (ref 6.5–8.1)

## 2022-05-02 LAB — CBC WITH DIFFERENTIAL (CANCER CENTER ONLY)
Abs Immature Granulocytes: 0.12 10*3/uL — ABNORMAL HIGH (ref 0.00–0.07)
Basophils Absolute: 0.1 10*3/uL (ref 0.0–0.1)
Basophils Relative: 1 %
Eosinophils Absolute: 0 10*3/uL (ref 0.0–0.5)
Eosinophils Relative: 0 %
HCT: 37.2 % (ref 36.0–46.0)
Hemoglobin: 11.5 g/dL — ABNORMAL LOW (ref 12.0–15.0)
Immature Granulocytes: 2 %
Lymphocytes Relative: 20 %
Lymphs Abs: 1.6 10*3/uL (ref 0.7–4.0)
MCH: 29.7 pg (ref 26.0–34.0)
MCHC: 30.9 g/dL (ref 30.0–36.0)
MCV: 96.1 fL (ref 80.0–100.0)
Monocytes Absolute: 0.3 10*3/uL (ref 0.1–1.0)
Monocytes Relative: 4 %
Neutro Abs: 6 10*3/uL (ref 1.7–7.7)
Neutrophils Relative %: 73 %
Platelet Count: 227 10*3/uL (ref 150–400)
RBC: 3.87 MIL/uL (ref 3.87–5.11)
RDW: 14.6 % (ref 11.5–15.5)
WBC Count: 8.1 10*3/uL (ref 4.0–10.5)
nRBC: 0 % (ref 0.0–0.2)

## 2022-05-02 NOTE — Progress Notes (Signed)
Hematology and Oncology Follow Up Visit  [name] [name] [id number] [birth year] 89 y.o. 05/02/2022   Principle Diagnosis:  Iron deficiency anemia  Erythropoietin deficiency anemia    Current Therapy:        IV iron as indicated  Aranesp 300 mcg SQ to maintain Hgb > 11   Interim History:  [name] [name] is here today with her daughter for follow-up. She is doing well but has fatigue at times. She takes a break to rest when needed.  She has not noted any blood loss. No bruising or petechiae.  No fever, chills, n/v, cough, rash, dizziness, SOB, chest pain, palpitations or changes in bowel or bladder habits.  She has intermittent abdominal pain when standing or sitting due to known rectocele. Minimal puffiness in her feet and ankles. This improves with propping up her feet.  No falls or syncope reported.  Appetite and hydration are good. Weight is 85 lbs.   ECOG Performance Status: 1 - Symptomatic but completely ambulatory  Medications:  Allergies as of 05/02/2022       Reactions   Amlodipine Shortness Of Breath   Darifenacin Hydrobromide Other (See Comments)   dizziness   Sulfonamide Derivatives Other (See Comments)   dizziness   Other Other (See Comments)   dizziness   Tramadol Other (See Comments)   Insomnia, anorexia        Medication List        Accurate as of May 02, 2022 12:57 PM. If you have any questions, ask your nurse or doctor.          ALPRAZolam 0.25 MG tablet Commonly known as: XANAX TAKE 0.5-1 TABLETS (0.125-0.25 MG TOTAL) BY MOUTH 2 (TWO) TIMES DAILY AS NEEDED FOR ANXIETY.   apixaban 2.5 MG Tabs tablet Commonly known as: Eliquis Take 1 tablet (2.5 mg total) by mouth 2 (two) times daily.   diclofenac Sodium 1 % Gel Commonly known as: Voltaren Apply 2 g topically 4 (four) times daily.   famotidine 20 MG tablet Commonly known as: PEPCID Take 20 mg by mouth at bedtime.   furosemide 20 MG tablet Commonly known as: LASIX Take 1 tablet (20 mg total)  by mouth as needed for fluid or edema (swelling or weight gain of 2lbs overnight or 5lbs in a week).   Hemocyte-F 324-1 MG Tabs Generic drug: Ferrous Fumarate-Folic Acid Take 1 tablet by mouth daily.   isosorbide mononitrate 30 MG 24 hr tablet Commonly known as: IMDUR Take 1 tablet (30 mg total) by mouth daily.   levocetirizine 5 MG tablet Commonly known as: XYZAL Take 2.5 mg by mouth daily at 12 noon.   lidocaine 5 % Commonly known as: Lidoderm Place 1 patch onto the skin daily. Remove & Discard patch within 12 hours or as directed by MD   losartan 25 MG tablet Commonly known as: COZAAR Take 1 tablet (25 mg total) by mouth daily.   metoprolol succinate 25 MG 24 hr tablet Commonly known as: TOPROL-XL TAKE 1 TABLET (25 MG TOTAL) BY MOUTH DAILY.   pantoprazole 40 MG tablet Commonly known as: PROTONIX TAKE 1 TABLET BY MOUTH EVERY DAY   tiZANidine 2 MG tablet Commonly known as: ZANAFLEX Take 0.5-1 tablets (1-2 mg total) by mouth every 6 (six) hours as needed for muscle spasms.   Vitamin D 50 MCG (2000 UT) Caps Take 2,000 Units by mouth every other day.        Allergies:  Allergies  Allergen Reactions   Amlodipine Shortness Of Breath  Darifenacin Hydrobromide Other (See Comments)    dizziness   Sulfonamide Derivatives Other (See Comments)    dizziness   Other Other (See Comments)    dizziness   Tramadol Other (See Comments)    Insomnia, anorexia    Past Medical History, Surgical history, Social history, and Family History were reviewed and updated.  Review of Systems: All other 10 point review of systems is negative.   Physical Exam:  vitals were not taken for this visit.   Wt Readings from Last 3 Encounters:  03/20/22 89 lb (40.4 kg)  03/05/22 91 lb 4 oz (41.4 kg)  02/06/22 85 lb (38.6 kg)    Ocular: Sclerae unicteric, pupils equal, round and reactive to light Ear-nose-throat: Oropharynx clear, dentition fair Lymphatic: No cervical or  supraclavicular adenopathy Lungs no rales or rhonchi, good excursion bilaterally Heart regular rate and rhythm, no murmur appreciated Abd soft, nontender, positive bowel sounds MSK no focal spinal tenderness, no joint edema Neuro: non-focal, well-oriented, appropriate affect Breasts: Deferred   Lab Results  Component Value Date   WBC 8.1 05/02/2022   HGB 11.5 (L) 05/02/2022   HCT 37.2 05/02/2022   MCV 96.1 05/02/2022   PLT 227 05/02/2022   Lab Results  Component Value Date   FERRITIN 23 04/10/2022   IRON 36 04/10/2022   TIBC 301 04/10/2022   UIBC 265 04/10/2022   IRONPCTSAT 12 04/10/2022   Lab Results  Component Value Date   RETICCTPCT 1.2 12/22/2021   RBC 3.87 05/02/2022   No results found for: "KPAFRELGTCHN", "LAMBDASER", "KAPLAMBRATIO" No results found for: "IGGSERUM", "IGA", "IGMSERUM" No results found for: "TOTALPROTELP", "ALBUMINELP", "A1GS", "A2GS", "BETS", "BETA2SER", "GAMS", "MSPIKE", "SPEI"   Chemistry      Component Value Date/Time   NA 141 04/10/2022 1250   NA 139 04/11/2021 1312   K 4.7 04/10/2022 1250   CL 104 04/10/2022 1250   CO2 31 04/10/2022 1250   BUN 21 04/10/2022 1250   BUN 30 04/11/2021 1312   CREATININE 1.18 (H) 04/10/2022 1250   CREATININE 1.15 (H) 10/29/2019 1358      Component Value Date/Time   CALCIUM 9.1 04/10/2022 1250   ALKPHOS 16 (L) 04/10/2022 1250   AST 13 (L) 04/10/2022 1250   ALT 7 04/10/2022 1250   BILITOT 0.3 04/10/2022 1250       Impression and Plan: [name] [name] is a very pleasant 89 yo caucasian female with multifactorial anemia (IDA and erythropoietin deficiency).  No ESA today, Hgb 11.5.  Lab and injection every 3 weeks, follow-up in 6 weeks.   Eileen Stanford, NP 5/1/202412:57 PM

## 2022-05-09 ENCOUNTER — Other Ambulatory Visit (HOSPITAL_BASED_OUTPATIENT_CLINIC_OR_DEPARTMENT_OTHER): Payer: Self-pay | Admitting: Nurse Practitioner

## 2022-05-16 NOTE — Assessment & Plan Note (Signed)
Encourage heart healthy diet such as MIND or DASH diet, increase exercise, avoid trans fats, simple carbohydrates and processed foods, consider a krill or fish or flaxseed oil cap daily.  °

## 2022-05-16 NOTE — Assessment & Plan Note (Signed)
hgba1c acceptable, minimize simple carbs. Increase exercise as tolerated.  

## 2022-05-16 NOTE — Assessment & Plan Note (Signed)
Hydrate and monitor 

## 2022-05-16 NOTE — Assessment & Plan Note (Signed)
Well controlled, no changes to meds. Encouraged heart healthy diet such as the DASH diet and exercise as tolerated.  °

## 2022-05-16 NOTE — Assessment & Plan Note (Signed)
Supplement and monitor 

## 2022-05-16 NOTE — Assessment & Plan Note (Signed)
On Levothyroxine, continue to monitor 

## 2022-05-16 NOTE — Assessment & Plan Note (Signed)
Rate controlled tolerating meds 

## 2022-05-17 ENCOUNTER — Encounter: Payer: Self-pay | Admitting: Family Medicine

## 2022-05-17 ENCOUNTER — Ambulatory Visit (INDEPENDENT_AMBULATORY_CARE_PROVIDER_SITE_OTHER): Payer: Medicare Other | Admitting: Family Medicine

## 2022-05-17 VITALS — BP 126/74 | HR 67 | Temp 97.5°F | Resp 16 | Ht 59.0 in | Wt 85.6 lb

## 2022-05-17 DIAGNOSIS — N1831 Chronic kidney disease, stage 3a: Secondary | ICD-10-CM

## 2022-05-17 DIAGNOSIS — E559 Vitamin D deficiency, unspecified: Secondary | ICD-10-CM | POA: Diagnosis not present

## 2022-05-17 DIAGNOSIS — R700 Elevated erythrocyte sedimentation rate: Secondary | ICD-10-CM

## 2022-05-17 DIAGNOSIS — R739 Hyperglycemia, unspecified: Secondary | ICD-10-CM

## 2022-05-17 DIAGNOSIS — R7982 Elevated C-reactive protein (CRP): Secondary | ICD-10-CM | POA: Diagnosis not present

## 2022-05-17 DIAGNOSIS — N816 Rectocele: Secondary | ICD-10-CM

## 2022-05-17 DIAGNOSIS — E039 Hypothyroidism, unspecified: Secondary | ICD-10-CM

## 2022-05-17 DIAGNOSIS — I10 Essential (primary) hypertension: Secondary | ICD-10-CM

## 2022-05-17 DIAGNOSIS — I4819 Other persistent atrial fibrillation: Secondary | ICD-10-CM

## 2022-05-17 DIAGNOSIS — E538 Deficiency of other specified B group vitamins: Secondary | ICD-10-CM

## 2022-05-17 DIAGNOSIS — E778 Other disorders of glycoprotein metabolism: Secondary | ICD-10-CM | POA: Insufficient documentation

## 2022-05-17 DIAGNOSIS — E782 Mixed hyperlipidemia: Secondary | ICD-10-CM

## 2022-05-17 LAB — CBC WITH DIFFERENTIAL/PLATELET
Basophils Absolute: 0 10*3/uL (ref 0.0–0.1)
Basophils Relative: 0.6 % (ref 0.0–3.0)
Eosinophils Absolute: 0 10*3/uL (ref 0.0–0.7)
Eosinophils Relative: 0.1 % (ref 0.0–5.0)
HCT: 33.7 % — ABNORMAL LOW (ref 36.0–46.0)
Hemoglobin: 11 g/dL — ABNORMAL LOW (ref 12.0–15.0)
Lymphocytes Relative: 18.7 % (ref 12.0–46.0)
Lymphs Abs: 1.3 10*3/uL (ref 0.7–4.0)
MCHC: 32.7 g/dL (ref 30.0–36.0)
MCV: 93.2 fl (ref 78.0–100.0)
Monocytes Absolute: 0.4 10*3/uL (ref 0.1–1.0)
Monocytes Relative: 5.2 % (ref 3.0–12.0)
Neutro Abs: 5.4 10*3/uL (ref 1.4–7.7)
Neutrophils Relative %: 75.4 % (ref 43.0–77.0)
Platelets: 226 10*3/uL (ref 150.0–400.0)
RBC: 3.61 Mil/uL — ABNORMAL LOW (ref 3.87–5.11)
RDW: 14.9 % (ref 11.5–15.5)
WBC: 7.2 10*3/uL (ref 4.0–10.5)

## 2022-05-17 LAB — TSH: TSH: 5.28 u[IU]/mL (ref 0.35–5.50)

## 2022-05-17 LAB — COMPREHENSIVE METABOLIC PANEL
ALT: 8 U/L (ref 0–35)
AST: 13 U/L (ref 0–37)
Albumin: 3 g/dL — ABNORMAL LOW (ref 3.5–5.2)
Alkaline Phosphatase: 16 U/L — ABNORMAL LOW (ref 39–117)
BUN: 20 mg/dL (ref 6–23)
CO2: 31 mEq/L (ref 19–32)
Calcium: 8.3 mg/dL — ABNORMAL LOW (ref 8.4–10.5)
Chloride: 104 mEq/L (ref 96–112)
Creatinine, Ser: 0.87 mg/dL (ref 0.40–1.20)
GFR: 56.26 mL/min — ABNORMAL LOW (ref >60.00)
Glucose, Bld: 78 mg/dL (ref 70–99)
Potassium: 4.3 mEq/L (ref 3.5–5.1)
Sodium: 142 mEq/L (ref 135–145)
Total Bilirubin: 0.3 mg/dL (ref 0.2–1.2)
Total Protein: 6.4 g/dL (ref 6.0–8.3)

## 2022-05-17 LAB — HIGH SENSITIVITY CRP: CRP, High Sensitivity: 7.48 mg/L — ABNORMAL HIGH (ref 0.000–5.000)

## 2022-05-17 LAB — VITAMIN B12: Vitamin B-12: 152 pg/mL — ABNORMAL LOW (ref 211–911)

## 2022-05-17 LAB — LIPID PANEL
Cholesterol: 112 mg/dL (ref 0–200)
HDL: 40.5 mg/dL (ref >39.00)
LDL Cholesterol: 56 mg/dL (ref 0–99)
NonHDL: 71.19
Total CHOL/HDL Ratio: 3
Triglycerides: 76 mg/dL (ref 0.0–149.0)
VLDL: 15.2 mg/dL (ref 0.0–40.0)

## 2022-05-17 LAB — SEDIMENTATION RATE: Sed Rate: 31 mm/hr — ABNORMAL HIGH (ref 0–30)

## 2022-05-17 LAB — VITAMIN D 25 HYDROXY (VIT D DEFICIENCY, FRACTURES): VITD: 34.75 ng/mL (ref 30.00–100.00)

## 2022-05-17 LAB — HEMOGLOBIN A1C: Hgb A1c MFr Bld: 5.4 % (ref 4.6–6.5)

## 2022-05-17 MED ORDER — NYSTATIN 100000 UNIT/GM EX OINT: 1.0000 | TOPICAL_OINTMENT | Freq: Two times a day (BID) | CUTANEOUS | 1 refills | Status: DC | PRN

## 2022-05-17 NOTE — Progress Notes (Signed)
Subjective:   By signing my name below, I, [name] [name], attest that this documentation has been prepared under the direction and in the presence of Bradd Canary, MD., 05/17/2022.   Patient ID: [name] [name], female    DOB: January 31, 1924, 89 y.o.   MRN: [id number]  Chief Complaint  Patient presents with   Follow-up    Follow up   HPI Patient is in today for an office visit and is accompanied by her daughter.  Dysphagia Patient reports that she has difficulty swallowing certain foods and liquids that is causing her to choke. She has been to a gastroenterologist in the past who recommended that she stick with a predominantly liquid diet. She will consider completing a swallow evaluation.  Pelvic Pain/Vaginal Itching Patient reports that she has a rectocele and has been experiencing associated pelvic pain. The pain presents when changing positions from sitting to standing, but is absent when constantly sitting or standing. She rates this pain as 6-7/10 and states that it is isolated to the pelvis and does not radiate. She further reports that she has seen blood in the stool when adjusting positions in the commode. Her bowel movements are inconsistent and her stools fluctuate between solid and loose. Additionally, she complains of vaginal itching but denies denies dysuria, urinary frequency, or urgency. She also denies CP/palpitations/SOB/HA/fever/chills/nausea.vomiting.   Joint and Muscle Pain Patient reports that her joint and muscle pain is mild and is not causing debilitation. She continues to use Lidocaine 5% patches as needed.  Past Medical History:  Diagnosis Date   Anemia    Anxiety    Arthritis    hips, knees   Current use of long term anticoagulation    Diverticulitis of colon 02/06/2015   Gait difficulty    GERD (gastroesophageal reflux disease)    Headache(784.0) 06/22/2012   Hyperlipidemia    Hypertension    IBS (irritable bowel syndrome)    Incontinence    Loss of  weight 06/05/2015   Medicare annual wellness visit, subsequent 02/05/2014   Melena    Mild mitral regurgitation    Mild tricuspid regurgitation    Mitral and aortic regurgitation    Mitral valve prolapse    hx of - not seen on recent echoes   Osteopenia    Paroxysmal atrial flutter (HCC)    Pedal edema 10/01/2016   Persistent atrial fibrillation (HCC)    Personal history of colonic polyps    Renal lithiasis 06/26/2015   Sinus bradycardia    Thyroid disease    hypo   Vitamin B12 deficiency 03/16/2016   Vitamin D deficiency     Past Surgical History:  Procedure Laterality Date   BOTOX INJECTION N/A 09/03/2012   Procedure: BOTOX INJECTION;  Surgeon: Louis Meckel, MD;  Location: [facility];  Service: Endoscopy;  Laterality: N/A;   BREAST BIOPSY Left    BREAST LUMPECTOMY Right 08/18/2014   Procedure: RIGHT BREAST LUMPECTOMY;  Surgeon: Abigail Miyamoto, MD;  Location: [facility];  Service: General;  Laterality: Right;   CARDIAC ELECTROPHYSIOLOGY MAPPING AND ABLATION     CATARACT EXTRACTION, BILATERAL     ESOPHAGOGASTRODUODENOSCOPY N/A 09/03/2012   Procedure: ESOPHAGOGASTRODUODENOSCOPY (EGD);  Surgeon: Louis Meckel, MD;  Location: [facility];  Service: Endoscopy;  Laterality: N/A;   ESOPHAGOGASTRODUODENOSCOPY (EGD) WITH PROPOFOL N/A 12/02/2018   Procedure: ESOPHAGOGASTRODUODENOSCOPY (EGD) WITH PROPOFOL;  Surgeon: Sherrilyn Rist, MD;  Location: [facility];  Service: Gastroenterology;  Laterality: N/A;   ESOPHAGOSCOPY W/ BOTOX  INJECTION     I & D EXTREMITY Right 11/06/2012   Procedure: IRRIGATION AND DEBRIDEMENT EXTREMITY Right Ring Finger;  Surgeon: Tami Ribas, MD;  Location: [facility];  Service: Orthopedics;  Laterality: Right;   PARTIAL HYSTERECTOMY     ovaries left in place   skin cancer removal      Family History  Problem Relation Age of Onset   Diabetes Mother    CVA Mother    Stroke Mother    COPD Father    Rheum arthritis Father    Allergies Daughter     COPD Daughter    Stroke Daughter    COPD Daughter        previous smoker   Stroke Maternal Grandfather    Heart disease Maternal Aunt    Heart disease Maternal Uncle    Colon cancer Neg Hx    Colon polyps Neg Hx    Esophageal cancer Neg Hx    Gallbladder disease Neg Hx    Kidney disease Neg Hx    Heart attack Neg Hx    Hypertension Neg Hx     Social History   Socioeconomic History   Marital status: Widowed    Spouse name: Not on file   Number of children: 5   Years of education: Not on file   Highest education level: Not on file  Occupational History   Occupation: retired    Associate Professor: RETIRED  Tobacco Use   Smoking status: Never   Smokeless tobacco: Never  Vaping Use   Vaping Use: Never used  Substance and Sexual Activity   Alcohol use: No   Drug use: No   Sexual activity: Not Currently    Comment: lives with Daughter, grandson and his family. no dietary restricitons.  Other Topics Concern   Not on file  Social History Narrative   Not on file   Social Determinants of Health   Financial Resource Strain: Low Risk  (12/11/2021)   Overall Financial Resource Strain (CARDIA)    Difficulty of Paying Living Expenses: Not hard at all  Food Insecurity: No Food Insecurity (12/11/2021)   Hunger Vital Sign    Worried About Running Out of Food in the Last Year: Never true    Ran Out of Food in the Last Year: Never true  Transportation Needs: No Transportation Needs (12/11/2021)   PRAPARE - Administrator, Civil Service (Medical): No    Lack of Transportation (Non-Medical): No  Physical Activity: Inactive (12/11/2021)   Exercise Vital Sign    Days of Exercise per Week: 0 days    Minutes of Exercise per Session: 0 min  Stress: No Stress Concern Present (12/11/2021)   [employer] of Occupational Health - Occupational Stress Questionnaire    Feeling of Stress : Not at all  Social Connections: Socially Isolated (12/11/2021)   Social Connection and  Isolation Panel [NHANES]    Frequency of Communication with Friends and Family: Once a week    Frequency of Social Gatherings with Friends and Family: Once a week    Attends Religious Services: Never    Database administrator or Organizations: No    Attends Banker Meetings: Never    Marital Status: Widowed  Intimate Partner Violence: Not At Risk (12/11/2021)   Humiliation, Afraid, Rape, and Kick questionnaire    Fear of Current or Ex-Partner: No    Emotionally Abused: No    Physically Abused: No    Sexually Abused: No  Outpatient Medications Prior to Visit  Medication Sig Dispense Refill   ALPRAZolam (XANAX) 0.25 MG tablet TAKE 0.5-1 TABLETS (0.125-0.25 MG TOTAL) BY MOUTH 2 (TWO) TIMES DAILY AS NEEDED FOR ANXIETY. 30 tablet 1   apixaban (ELIQUIS) 2.5 MG TABS tablet Take 1 tablet (2.5 mg total) by mouth 2 (two) times daily. 60 tablet 5   Cholecalciferol (VITAMIN D) 2000 UNITS CAPS Take 2,000 Units by mouth every other day.     diclofenac Sodium (VOLTAREN) 1 % GEL Apply 2 g topically 4 (four) times daily. 50 g 0   famotidine (PEPCID) 20 MG tablet Take 20 mg by mouth at bedtime.     Ferrous Fumarate-Folic Acid (HEMOCYTE-F) 324-1 MG TABS Take 1 tablet by mouth daily. 30 tablet 5   furosemide (LASIX) 20 MG tablet PLEASE SEE ATTACHED FOR DETAILED DIRECTIONS 90 tablet 0   isosorbide mononitrate (IMDUR) 30 MG 24 hr tablet Take 1 tablet (30 mg total) by mouth daily. 90 tablet 2   levocetirizine (XYZAL) 5 MG tablet Take 2.5 mg by mouth daily at 12 noon.     lidocaine (LIDODERM) 5 % Place 1 patch onto the skin daily. Remove & Discard patch within 12 hours or as directed by MD 30 patch 0   losartan (COZAAR) 25 MG tablet Take 1 tablet (25 mg total) by mouth daily. 90 tablet 1   metoprolol succinate (TOPROL-XL) 25 MG 24 hr tablet TAKE 1 TABLET (25 MG TOTAL) BY MOUTH DAILY. 90 tablet 1   pantoprazole (PROTONIX) 40 MG tablet TAKE 1 TABLET BY MOUTH EVERY DAY 90 tablet 1   tiZANidine  (ZANAFLEX) 2 MG tablet Take 0.5-1 tablets (1-2 mg total) by mouth every 6 (six) hours as needed for muscle spasms. 30 tablet 1   No facility-administered medications prior to visit.    Allergies  Allergen Reactions   Amlodipine Shortness Of Breath   Darifenacin Hydrobromide Other (See Comments)    dizziness   Sulfonamide Derivatives Other (See Comments)    dizziness   Other Other (See Comments)    dizziness   Tramadol Other (See Comments)    Insomnia, anorexia    Review of Systems  Constitutional:  Negative for chills and fever.  Respiratory:  Negative for shortness of breath.   Cardiovascular:  Negative for chest pain and palpitations.  Gastrointestinal:  Positive for blood in stool, constipation and diarrhea. Negative for nausea and vomiting.  Genitourinary:  Positive for hematuria. Negative for dysuria, frequency and urgency.       (+) vaginal itching.  Skin:           Neurological:  Negative for headaches.       Objective:    Physical Exam Constitutional:      General: She is not in acute distress.    Appearance: Normal appearance. She is not ill-appearing.  HENT:     Head: Normocephalic and atraumatic.     Right Ear: External ear normal.     Left Ear: External ear normal.     Nose: Nose normal.     Mouth/Throat:     Mouth: Mucous membranes are moist.     Pharynx: Oropharynx is clear.  Eyes:     General:        Right eye: No discharge.        Left eye: No discharge.     Extraocular Movements: Extraocular movements intact.     Conjunctiva/sclera: Conjunctivae normal.     Pupils: Pupils are equal, round, and reactive to light.  Cardiovascular:     Rate and Rhythm: Normal rate and regular rhythm.     Pulses: Normal pulses.     Heart sounds: Normal heart sounds. No murmur heard.    No gallop.  Pulmonary:     Effort: Pulmonary effort is normal. No respiratory distress.     Breath sounds: Normal breath sounds. No wheezing or rales.  Abdominal:     General:  Bowel sounds are normal.     Palpations: Abdomen is soft.     Tenderness: There is no abdominal tenderness. There is no guarding.  Musculoskeletal:        General: Normal range of motion.     Cervical back: Normal range of motion.     Right lower leg: No edema.     Left lower leg: No edema.  Skin:    General: Skin is warm and dry.  Neurological:     Mental Status: She is alert and oriented to person, place, and time.  Psychiatric:        Mood and Affect: Mood normal.        Behavior: Behavior normal.        Judgment: Judgment normal.     BP 126/74 (BP Location: Right Arm, Patient Position: Sitting, Cuff Size: Small)   Pulse 67   Temp (!) 97.5 F (36.4 C) (Oral)   Resp 16   Ht 4\' 11"  (1.499 m)   Wt 85 lb 9.6 oz (38.8 kg)   SpO2 97%   BMI 17.29 kg/m  Wt Readings from Last 3 Encounters:  05/17/22 85 lb 9.6 oz (38.8 kg)  05/02/22 85 lb (38.6 kg)  03/20/22 89 lb (40.4 kg)    Diabetic Foot Exam - Simple   No data filed    Lab Results  Component Value Date   WBC 7.2 05/17/2022   HGB 11.0 (L) 05/17/2022   HCT 33.7 (L) 05/17/2022   PLT 226.0 05/17/2022   GLUCOSE 78 05/17/2022   CHOL 112 05/17/2022   TRIG 76.0 05/17/2022   HDL 40.50 05/17/2022   LDLDIRECT 72.1 01/19/2009   LDLCALC 56 05/17/2022   ALT 8 05/17/2022   AST 13 05/17/2022   NA 142 05/17/2022   K 4.3 05/17/2022   CL 104 05/17/2022   CREATININE 0.87 05/17/2022   BUN 20 05/17/2022   CO2 31 05/17/2022   TSH 5.28 05/17/2022   INR 1.3 (H) 09/19/2021   HGBA1C 5.4 05/17/2022    Lab Results  Component Value Date   TSH 5.28 05/17/2022   Lab Results  Component Value Date   WBC 7.2 05/17/2022   HGB 11.0 (L) 05/17/2022   HCT 33.7 (L) 05/17/2022   MCV 93.2 05/17/2022   PLT 226.0 05/17/2022   Lab Results  Component Value Date   NA 142 05/17/2022   K 4.3 05/17/2022   CO2 31 05/17/2022   GLUCOSE 78 05/17/2022   BUN 20 05/17/2022   CREATININE 0.87 05/17/2022   BILITOT 0.3 05/17/2022   ALKPHOS 16 (L)  05/17/2022   AST 13 05/17/2022   ALT 8 05/17/2022   PROT 6.4 05/17/2022   ALBUMIN 3.0 (L) 05/17/2022   CALCIUM 8.3 (L) 05/17/2022   ANIONGAP 7 05/02/2022   EGFR 41 (L) 04/11/2021   GFR 56.26 (L) 05/17/2022   Lab Results  Component Value Date   CHOL 112 05/17/2022   Lab Results  Component Value Date   HDL 40.50 05/17/2022   Lab Results  Component Value Date   LDLCALC 56 05/17/2022  Lab Results  Component Value Date   TRIG 76.0 05/17/2022   Lab Results  Component Value Date   CHOLHDL 3 05/17/2022   Lab Results  Component Value Date   HGBA1C 5.4 05/17/2022      Assessment & Plan:  Diarrhea/Constipation: Recommended Benefiber, Miralax, and stool softeners such as Docusate.   Dysphagia: Patient will consider completing a swallow evaluation after determining what foods/liquids she chokes on.   Labs: Blood work today will check calcium and vitamin D levels.  Vaginal Itching: Nystatin ointment prescribed: apply 1 application topically 2 times daily as needed. Problem List Items Addressed This Visit     Essential hypertension - Primary (Chronic)    Well controlled, no changes to meds. Encouraged heart healthy diet such as the DASH diet and exercise as tolerated.        Relevant Orders   CBC with Differential/Platelet (Completed)   Comprehensive metabolic panel (Completed)   TSH (Completed)   Hyperglycemia    hgba1c acceptable, minimize simple carbs. Increase exercise as tolerated.       Relevant Orders   Hemoglobin A1c (Completed)   Hyperlipidemia, mixed    Encourage heart healthy diet such as MIND or DASH diet, increase exercise, avoid trans fats, simple carbohydrates and processed foods, consider a krill or fish or flaxseed oil cap daily.       Relevant Orders   Lipid panel (Completed)   Hypothyroidism    On Levothyroxine, continue to monitor       Persistent atrial fibrillation (HCC)    Rate controlled tolerating meds      Stage 3a chronic kidney  disease (CKD) (HCC)    Hydrate and monitor       Vitamin B12 deficiency    Supplement and monitor       Relevant Orders   Vitamin B12 (Completed)   Vitamin D deficiency    Supplement and monitor       Relevant Orders   VITAMIN D 25 Hydroxy (Vit-D Deficiency, Fractures) (Completed)   CRP elevated    Improving, no changes      Relevant Orders   CRP High sensitivity (Completed)   Elevated sed rate    Improving, no changes      Relevant Orders   Sedimentation rate (Completed)   Hypocalcemia   Prolapse of female pelvic organs    Rectocele only hurts when she is changing positions from sitting to standing. She is not a surgical candidate but they are advised to consider a lift chair and adjustable bed to help manage symptoms.       Meds ordered this encounter  Medications   nystatin ointment (MYCOSTATIN)    Sig: Apply 1 Application topically 2 (two) times daily as needed.    Dispense:  30 g    Refill:  1   I, Danise Edge, MD, personally preformed the services described in this documentation.  All medical record entries made by the scribe were at my direction and in my presence.  I have reviewed the chart and discharge instructions (if applicable) and agree that the record reflects my personal performance and is accurate and complete. 05/17/2022  I,Mohammed Iqbal,acting as a scribe for Danise Edge, MD.,have documented all relevant documentation on the behalf of Danise Edge, MD,as directed by  Danise Edge, MD while in the presence of Danise Edge, MD.  Danise Edge, MD

## 2022-05-17 NOTE — Assessment & Plan Note (Signed)
Rectocele only hurts when she is changing positions from sitting to standing. She is not a surgical candidate but they are advised to consider a lift chair and adjustable bed to help manage symptoms.

## 2022-05-17 NOTE — Assessment & Plan Note (Signed)
Improving, no changes

## 2022-05-17 NOTE — Assessment & Plan Note (Signed)
Increase protein intake in diet

## 2022-05-17 NOTE — Patient Instructions (Addendum)
Consider a Swallow Evaluation after you track what you are choking on  Let us know if the itching does improve after a couple weeks and we can consider changing meds

## 2022-05-18 ENCOUNTER — Other Ambulatory Visit: Payer: Self-pay

## 2022-05-18 ENCOUNTER — Other Ambulatory Visit: Payer: Medicare Other

## 2022-05-18 DIAGNOSIS — E538 Deficiency of other specified B group vitamins: Secondary | ICD-10-CM

## 2022-05-18 DIAGNOSIS — I10 Essential (primary) hypertension: Secondary | ICD-10-CM

## 2022-05-18 NOTE — Progress Notes (Signed)
Patient called was advised of vitamin B-12 low needing to get appt with nurse visit  For B-12 shot.  Patient aware ask for me to call her daughter who I called Lvm to call  our office back to discuss getting appt setup. Add on Intr[employer] has been ordered and faxed.Just waiting for daughter to call back to make appt for B-12 nurse visit appt scheduled.

## 2022-05-19 LAB — IRON,TIBC AND FERRITIN PANEL
%SAT: 18 % (calc) (ref 16–45)
Ferritin: 27 ng/mL (ref 16–288)
Iron: 48 ug/dL (ref 45–160)
TIBC: 267 mcg/dL (calc) (ref 250–450)

## 2022-05-22 ENCOUNTER — Other Ambulatory Visit: Payer: Self-pay

## 2022-05-22 DIAGNOSIS — E538 Deficiency of other specified B group vitamins: Secondary | ICD-10-CM

## 2022-05-23 ENCOUNTER — Inpatient Hospital Stay: Payer: Medicare Other

## 2022-05-23 ENCOUNTER — Ambulatory Visit (INDEPENDENT_AMBULATORY_CARE_PROVIDER_SITE_OTHER): Payer: Medicare Other

## 2022-05-23 VITALS — BP 134/57 | HR 60 | Temp 97.7°F | Resp 18

## 2022-05-23 DIAGNOSIS — D631 Anemia in chronic kidney disease: Secondary | ICD-10-CM

## 2022-05-23 DIAGNOSIS — E538 Deficiency of other specified B group vitamins: Secondary | ICD-10-CM | POA: Diagnosis not present

## 2022-05-23 DIAGNOSIS — D509 Iron deficiency anemia, unspecified: Secondary | ICD-10-CM | POA: Diagnosis not present

## 2022-05-23 DIAGNOSIS — D500 Iron deficiency anemia secondary to blood loss (chronic): Secondary | ICD-10-CM

## 2022-05-23 DIAGNOSIS — N1831 Chronic kidney disease, stage 3a: Secondary | ICD-10-CM | POA: Diagnosis not present

## 2022-05-23 DIAGNOSIS — Z7901 Long term (current) use of anticoagulants: Secondary | ICD-10-CM

## 2022-05-23 LAB — CBC WITH DIFFERENTIAL (CANCER CENTER ONLY)
Abs Immature Granulocytes: 0.01 10*3/uL (ref 0.00–0.07)
Basophils Absolute: 0 10*3/uL (ref 0.0–0.1)
Basophils Relative: 1 %
Eosinophils Absolute: 0 10*3/uL (ref 0.0–0.5)
Eosinophils Relative: 0 %
HCT: 32.6 % — ABNORMAL LOW (ref 36.0–46.0)
Hemoglobin: 10.2 g/dL — ABNORMAL LOW (ref 12.0–15.0)
Immature Granulocytes: 0 %
Lymphocytes Relative: 22 %
Lymphs Abs: 1.7 10*3/uL (ref 0.7–4.0)
MCH: 29.7 pg (ref 26.0–34.0)
MCHC: 31.3 g/dL (ref 30.0–36.0)
MCV: 95 fL (ref 80.0–100.0)
Monocytes Absolute: 0.4 10*3/uL (ref 0.1–1.0)
Monocytes Relative: 5 %
Neutro Abs: 5.6 10*3/uL (ref 1.7–7.7)
Neutrophils Relative %: 72 %
Platelet Count: 222 10*3/uL (ref 150–400)
RBC: 3.43 MIL/uL — ABNORMAL LOW (ref 3.87–5.11)
RDW: 14.5 % (ref 11.5–15.5)
WBC Count: 7.7 10*3/uL (ref 4.0–10.5)
nRBC: 0 % (ref 0.0–0.2)

## 2022-05-23 LAB — CMP (CANCER CENTER ONLY)
ALT: 9 U/L (ref 0–44)
AST: 12 U/L — ABNORMAL LOW (ref 15–41)
Albumin: 3.2 g/dL — ABNORMAL LOW (ref 3.5–5.0)
Alkaline Phosphatase: 16 U/L — ABNORMAL LOW (ref 38–126)
Anion gap: 6 (ref 5–15)
BUN: 18 mg/dL (ref 8–23)
CO2: 30 mmol/L (ref 22–32)
Calcium: 9 mg/dL (ref 8.9–10.3)
Chloride: 104 mmol/L (ref 98–111)
Creatinine: 0.92 mg/dL (ref 0.44–1.00)
GFR, Estimated: 57 mL/min — ABNORMAL LOW (ref >60)
Glucose, Bld: 107 mg/dL — ABNORMAL HIGH (ref 70–99)
Potassium: 4.8 mmol/L (ref 3.5–5.1)
Sodium: 140 mmol/L (ref 135–145)
Total Bilirubin: 0.3 mg/dL (ref 0.3–1.2)
Total Protein: 6.4 g/dL — ABNORMAL LOW (ref 6.5–8.1)

## 2022-05-23 MED ORDER — DARBEPOETIN ALFA 300 MCG/0.6ML IJ SOSY
300.0000 ug | PREFILLED_SYRINGE | Freq: Once | INTRAMUSCULAR | Status: AC
  Administered 2022-05-23: 300 ug via SUBCUTANEOUS
  Filled 2022-05-23: qty 0.6

## 2022-05-23 MED ORDER — CYANOCOBALAMIN 1000 MCG/ML IJ SOLN
1000.0000 ug | Freq: Once | INTRAMUSCULAR | Status: AC
Start: 2022-05-23 — End: 2022-05-23
  Administered 2022-05-23: 1000 ug via INTRAMUSCULAR

## 2022-05-23 NOTE — Progress Notes (Signed)
Pt here for 1/4 weekly B12 injection per Dr. Perley Jain  B12 given IM R deltoid , and pt tolerated injection well.  Next B12 injection scheduled for 05/31/2022.

## 2022-05-23 NOTE — Patient Instructions (Signed)

## 2022-05-31 ENCOUNTER — Ambulatory Visit (INDEPENDENT_AMBULATORY_CARE_PROVIDER_SITE_OTHER): Payer: Medicare Other

## 2022-05-31 DIAGNOSIS — E538 Deficiency of other specified B group vitamins: Secondary | ICD-10-CM | POA: Diagnosis not present

## 2022-05-31 MED ORDER — CYANOCOBALAMIN 1000 MCG/ML IJ SOLN
1000.0000 ug | Freq: Once | INTRAMUSCULAR | Status: AC
Start: 2022-05-31 — End: 2022-05-31
  Administered 2022-05-31: 1000 ug via INTRAMUSCULAR

## 2022-05-31 NOTE — Progress Notes (Signed)
Pt here for 2/4 weekly B12 injection per Dr. Abner Greenspan  B12 given IM R deltoid, and pt tolerated injection well.  Next B12 injection scheduled for 06/07/2022.

## 2022-06-07 ENCOUNTER — Ambulatory Visit (INDEPENDENT_AMBULATORY_CARE_PROVIDER_SITE_OTHER): Payer: Medicare Other | Admitting: *Deleted

## 2022-06-07 DIAGNOSIS — E538 Deficiency of other specified B group vitamins: Secondary | ICD-10-CM

## 2022-06-07 MED ORDER — CYANOCOBALAMIN 1000 MCG/ML IJ SOLN
1000.0000 ug | Freq: Once | INTRAMUSCULAR | Status: AC
Start: 2022-06-07 — End: 2022-06-07
  Administered 2022-06-07: 1000 ug via INTRAMUSCULAR

## 2022-06-07 NOTE — Progress Notes (Signed)
Patient here for weekly 3/4 b12 injection per physicians order.  Original order: 05/17/22 lab results  Injection given in right deltoid and patient tolerated well.  Next appointment scheduled in 2 weeks.  Patient will need labs drawn first before next injection.

## 2022-06-13 ENCOUNTER — Encounter: Payer: Self-pay | Admitting: Family

## 2022-06-13 ENCOUNTER — Inpatient Hospital Stay: Payer: Medicare Other

## 2022-06-13 ENCOUNTER — Inpatient Hospital Stay: Payer: Medicare Other | Attending: Hematology & Oncology | Admitting: Family

## 2022-06-13 VITALS — BP 178/60 | HR 65 | Temp 97.6°F | Resp 17 | Wt 85.4 lb

## 2022-06-13 DIAGNOSIS — Z7901 Long term (current) use of anticoagulants: Secondary | ICD-10-CM

## 2022-06-13 DIAGNOSIS — D500 Iron deficiency anemia secondary to blood loss (chronic): Secondary | ICD-10-CM

## 2022-06-13 DIAGNOSIS — E611 Iron deficiency: Secondary | ICD-10-CM | POA: Insufficient documentation

## 2022-06-13 DIAGNOSIS — E538 Deficiency of other specified B group vitamins: Secondary | ICD-10-CM

## 2022-06-13 DIAGNOSIS — N1831 Chronic kidney disease, stage 3a: Secondary | ICD-10-CM | POA: Diagnosis not present

## 2022-06-13 DIAGNOSIS — D631 Anemia in chronic kidney disease: Secondary | ICD-10-CM | POA: Diagnosis not present

## 2022-06-13 DIAGNOSIS — Z79899 Other long term (current) drug therapy: Secondary | ICD-10-CM | POA: Insufficient documentation

## 2022-06-13 LAB — CBC WITH DIFFERENTIAL (CANCER CENTER ONLY)
Abs Immature Granulocytes: 0.06 10*3/uL (ref 0.00–0.07)
Basophils Absolute: 0 10*3/uL (ref 0.0–0.1)
Basophils Relative: 0 %
Eosinophils Absolute: 0 10*3/uL (ref 0.0–0.5)
Eosinophils Relative: 0 %
HCT: 34.8 % — ABNORMAL LOW (ref 36.0–46.0)
Hemoglobin: 10.9 g/dL — ABNORMAL LOW (ref 12.0–15.0)
Immature Granulocytes: 1 %
Lymphocytes Relative: 20 %
Lymphs Abs: 1.5 10*3/uL (ref 0.7–4.0)
MCH: 29.5 pg (ref 26.0–34.0)
MCHC: 31.3 g/dL (ref 30.0–36.0)
MCV: 94.1 fL (ref 80.0–100.0)
Monocytes Absolute: 0.3 10*3/uL (ref 0.1–1.0)
Monocytes Relative: 5 %
Neutro Abs: 5.7 10*3/uL (ref 1.7–7.7)
Neutrophils Relative %: 74 %
Platelet Count: 256 10*3/uL (ref 150–400)
RBC: 3.7 MIL/uL — ABNORMAL LOW (ref 3.87–5.11)
RDW: 16.2 % — ABNORMAL HIGH (ref 11.5–15.5)
WBC Count: 7.6 10*3/uL (ref 4.0–10.5)
nRBC: 0 % (ref 0.0–0.2)

## 2022-06-13 LAB — IRON AND IRON BINDING CAPACITY (CC-WL,HP ONLY)
Iron: 59 ug/dL (ref 28–170)
Saturation Ratios: 21 % (ref 10.4–31.8)
TIBC: 286 ug/dL (ref 250–450)
UIBC: 227 ug/dL (ref 148–442)

## 2022-06-13 LAB — RETICULOCYTES
Immature Retic Fract: 12.3 % (ref 2.3–15.9)
RBC.: 3.66 MIL/uL — ABNORMAL LOW (ref 3.87–5.11)
Retic Count, Absolute: 65.9 10*3/uL (ref 19.0–186.0)
Retic Ct Pct: 1.8 % (ref 0.4–3.1)

## 2022-06-13 LAB — CMP (CANCER CENTER ONLY)
ALT: 9 U/L (ref 0–44)
AST: 14 U/L — ABNORMAL LOW (ref 15–41)
Albumin: 3.1 g/dL — ABNORMAL LOW (ref 3.5–5.0)
Alkaline Phosphatase: 17 U/L — ABNORMAL LOW (ref 38–126)
Anion gap: 6 (ref 5–15)
BUN: 20 mg/dL (ref 8–23)
CO2: 30 mmol/L (ref 22–32)
Calcium: 8.8 mg/dL — ABNORMAL LOW (ref 8.9–10.3)
Chloride: 103 mmol/L (ref 98–111)
Creatinine: 1.18 mg/dL — ABNORMAL HIGH (ref 0.44–1.00)
GFR, Estimated: 42 mL/min — ABNORMAL LOW (ref >60)
Glucose, Bld: 104 mg/dL — ABNORMAL HIGH (ref 70–99)
Potassium: 4.5 mmol/L (ref 3.5–5.1)
Sodium: 139 mmol/L (ref 135–145)
Total Bilirubin: 0.3 mg/dL (ref 0.3–1.2)
Total Protein: 6.7 g/dL (ref 6.5–8.1)

## 2022-06-13 LAB — FERRITIN: Ferritin: 19 ng/mL (ref 11–307)

## 2022-06-13 NOTE — Progress Notes (Signed)
Hematology and Oncology Follow Up Visit  [name] [name] [id number] 1924/10/19 89 y.o. 06/13/2022   Principle Diagnosis:  Iron deficiency anemia  Erythropoietin deficiency anemia    Current Therapy:        IV iron as indicated  Aranesp 300 mcg SQ to maintain Hgb > 11   Interim History:  [name] [name] is here today for follow-up. She is doing well and has no complaints at this time.  Hgb is stable at 10.9. She has an elevated BP at 178/60 today. She is unsure why. Mild swelling in her lower extremities. She has a lesion on her right shin where she states that she previously had a skin cancer removed. She has made an appointment with dermatology for further evaluation and treatment on 07/12/2022.  No falls or syncope reported. She ambulates with a Rolator for added support.  No fever, chills, n/v, cough, rash, dizziness, headaches, SOB, chest pain, palpitations or changes in bowel or bladder habits at this time.  She has intermittent abdominal pain with the rectocele.  No blood loss noted. No bruising or petechiae.  Appetite and hydration have been good. Weight is 85 lbs.   ECOG Performance Status: 1 - Symptomatic but completely ambulatory  Medications:  Allergies as of 06/13/2022       Reactions   Amlodipine Shortness Of Breath   Darifenacin Hydrobromide Other (See Comments)   dizziness   Sulfonamide Derivatives Other (See Comments)   dizziness   Other Other (See Comments)   dizziness   Tramadol Other (See Comments)   Insomnia, anorexia        Medication List        Accurate as of June 13, 2022  1:05 PM. If you have any questions, ask your nurse or doctor.          ALPRAZolam 0.25 MG tablet Commonly known as: XANAX TAKE 0.5-1 TABLETS (0.125-0.25 MG TOTAL) BY MOUTH 2 (TWO) TIMES DAILY AS NEEDED FOR ANXIETY.   apixaban 2.5 MG Tabs tablet Commonly known as: Eliquis Take 1 tablet (2.5 mg total) by mouth 2 (two) times daily.   diclofenac Sodium 1 % Gel Commonly  known as: Voltaren Apply 2 g topically 4 (four) times daily.   famotidine 20 MG tablet Commonly known as: PEPCID Take 20 mg by mouth at bedtime.   furosemide 20 MG tablet Commonly known as: LASIX PLEASE SEE ATTACHED FOR DETAILED DIRECTIONS   Hemocyte-F 324-1 MG Tabs Generic drug: Ferrous Fumarate-Folic Acid Take 1 tablet by mouth daily.   isosorbide mononitrate 30 MG 24 hr tablet Commonly known as: IMDUR Take 1 tablet (30 mg total) by mouth daily.   levocetirizine 5 MG tablet Commonly known as: XYZAL Take 2.5 mg by mouth daily at 12 noon.   lidocaine 5 % Commonly known as: Lidoderm Place 1 patch onto the skin daily. Remove & Discard patch within 12 hours or as directed by MD   losartan 25 MG tablet Commonly known as: COZAAR Take 1 tablet (25 mg total) by mouth daily.   metoprolol succinate 25 MG 24 hr tablet Commonly known as: TOPROL-XL TAKE 1 TABLET (25 MG TOTAL) BY MOUTH DAILY.   nystatin ointment Commonly known as: MYCOSTATIN Apply 1 Application topically 2 (two) times daily as needed.   pantoprazole 40 MG tablet Commonly known as: PROTONIX TAKE 1 TABLET BY MOUTH EVERY DAY   tiZANidine 2 MG tablet Commonly known as: ZANAFLEX Take 0.5-1 tablets (1-2 mg total) by mouth every 6 (six) hours as needed for  muscle spasms.   Vitamin D 50 MCG (2000 UT) Caps Take 2,000 Units by mouth every other day.        Allergies:  Allergies  Allergen Reactions   Amlodipine Shortness Of Breath   Darifenacin Hydrobromide Other (See Comments)    dizziness   Sulfonamide Derivatives Other (See Comments)    dizziness   Other Other (See Comments)    dizziness   Tramadol Other (See Comments)    Insomnia, anorexia    Past Medical History, Surgical history, Social history, and Family History were reviewed and updated.  Review of Systems: All other 10 point review of systems is negative.   Physical Exam:  vitals were not taken for this visit.   Wt Readings from Last 3  Encounters:  05/17/22 85 lb 9.6 oz (38.8 kg)  05/02/22 85 lb (38.6 kg)  03/20/22 89 lb (40.4 kg)    Ocular: Sclerae unicteric, pupils equal, round and reactive to light Ear-nose-throat: Oropharynx clear, dentition fair Lymphatic: No cervical or supraclavicular adenopathy Lungs no rales or rhonchi, good excursion bilaterally Heart regular rate and rhythm, no murmur appreciated Abd soft, nontender, positive bowel sounds MSK no focal spinal tenderness, no joint edema Neuro: non-focal, well-oriented, appropriate affect Breasts: Deferred   Lab Results  Component Value Date   WBC 7.6 06/13/2022   HGB 10.9 (L) 06/13/2022   HCT 34.8 (L) 06/13/2022   MCV 94.1 06/13/2022   PLT 256 06/13/2022   Lab Results  Component Value Date   FERRITIN 27 05/18/2022   IRON 48 05/18/2022   TIBC 267 05/18/2022   UIBC 265 04/10/2022   IRONPCTSAT 18 05/18/2022   Lab Results  Component Value Date   RETICCTPCT 1.8 06/13/2022   RBC 3.66 (L) 06/13/2022   No results found for: "KPAFRELGTCHN", "LAMBDASER", "KAPLAMBRATIO" No results found for: "IGGSERUM", "IGA", "IGMSERUM" No results found for: "TOTALPROTELP", "ALBUMINELP", "A1GS", "A2GS", "BETS", "BETA2SER", "GAMS", "MSPIKE", "SPEI"   Chemistry      Component Value Date/Time   NA 140 05/23/2022 1316   NA 139 04/11/2021 1312   K 4.8 05/23/2022 1316   CL 104 05/23/2022 1316   CO2 30 05/23/2022 1316   BUN 18 05/23/2022 1316   BUN 30 04/11/2021 1312   CREATININE 0.92 05/23/2022 1316   CREATININE 1.15 (H) 10/29/2019 1358      Component Value Date/Time   CALCIUM 9.0 05/23/2022 1316   ALKPHOS 16 (L) 05/23/2022 1316   AST 12 (L) 05/23/2022 1316   ALT 9 05/23/2022 1316   BILITOT 0.3 05/23/2022 1316       Impression and Plan: [name] [name] is a very pleasant 89 yo caucasian female with multifactorial anemia (IDA and erythropoietin deficiency).  No ESA given today, Hgb 10.9 and patient hypertensive.  Lab and injection every 3 weeks, follow-up in 6  weeks.   Eileen Stanford, NP 6/12/20241:05 PM

## 2022-06-21 ENCOUNTER — Other Ambulatory Visit: Payer: Self-pay | Admitting: *Deleted

## 2022-06-21 ENCOUNTER — Ambulatory Visit (INDEPENDENT_AMBULATORY_CARE_PROVIDER_SITE_OTHER): Payer: Medicare Other | Admitting: *Deleted

## 2022-06-21 ENCOUNTER — Other Ambulatory Visit (INDEPENDENT_AMBULATORY_CARE_PROVIDER_SITE_OTHER): Payer: Medicare Other

## 2022-06-21 DIAGNOSIS — E538 Deficiency of other specified B group vitamins: Secondary | ICD-10-CM

## 2022-06-21 LAB — VITAMIN B12: Vitamin B-12: 371 pg/mL (ref 211–911)

## 2022-06-21 MED ORDER — CYANOCOBALAMIN 1000 MCG/ML IJ SOLN
1000.0000 ug | Freq: Once | INTRAMUSCULAR | Status: AC
Start: 2022-06-21 — End: 2022-06-21
  Administered 2022-06-21: 1000 ug via INTRAMUSCULAR

## 2022-06-21 NOTE — Progress Notes (Signed)
Patient here for 1/4 biweekly  b12 injection per physicians order.   Original order: 05/17/22 lab results   Injection given in right deltoid and patient tolerated well.

## 2022-07-04 ENCOUNTER — Encounter: Payer: Self-pay | Admitting: Family

## 2022-07-04 ENCOUNTER — Other Ambulatory Visit: Payer: Self-pay

## 2022-07-04 ENCOUNTER — Inpatient Hospital Stay (HOSPITAL_BASED_OUTPATIENT_CLINIC_OR_DEPARTMENT_OTHER): Payer: Medicare Other | Admitting: Family

## 2022-07-04 ENCOUNTER — Ambulatory Visit (INDEPENDENT_AMBULATORY_CARE_PROVIDER_SITE_OTHER): Payer: Medicare Other

## 2022-07-04 ENCOUNTER — Inpatient Hospital Stay: Payer: Medicare Other | Attending: Hematology & Oncology

## 2022-07-04 ENCOUNTER — Inpatient Hospital Stay: Payer: Medicare Other

## 2022-07-04 VITALS — BP 130/62 | HR 69 | Temp 97.8°F | Resp 16 | Ht 59.0 in | Wt 84.8 lb

## 2022-07-04 DIAGNOSIS — E538 Deficiency of other specified B group vitamins: Secondary | ICD-10-CM

## 2022-07-04 DIAGNOSIS — Z7901 Long term (current) use of anticoagulants: Secondary | ICD-10-CM

## 2022-07-04 DIAGNOSIS — Z79899 Other long term (current) drug therapy: Secondary | ICD-10-CM | POA: Diagnosis not present

## 2022-07-04 DIAGNOSIS — D500 Iron deficiency anemia secondary to blood loss (chronic): Secondary | ICD-10-CM | POA: Diagnosis not present

## 2022-07-04 DIAGNOSIS — N1831 Chronic kidney disease, stage 3a: Secondary | ICD-10-CM | POA: Insufficient documentation

## 2022-07-04 DIAGNOSIS — D631 Anemia in chronic kidney disease: Secondary | ICD-10-CM | POA: Insufficient documentation

## 2022-07-04 DIAGNOSIS — E611 Iron deficiency: Secondary | ICD-10-CM | POA: Diagnosis not present

## 2022-07-04 LAB — CMP (CANCER CENTER ONLY)
ALT: 8 U/L (ref 0–44)
AST: 14 U/L — ABNORMAL LOW (ref 15–41)
Albumin: 3.1 g/dL — ABNORMAL LOW (ref 3.5–5.0)
Alkaline Phosphatase: 17 U/L — ABNORMAL LOW (ref 38–126)
Anion gap: 7 (ref 5–15)
BUN: 21 mg/dL (ref 8–23)
CO2: 28 mmol/L (ref 22–32)
Calcium: 8.9 mg/dL (ref 8.9–10.3)
Chloride: 105 mmol/L (ref 98–111)
Creatinine: 1.05 mg/dL — ABNORMAL HIGH (ref 0.44–1.00)
GFR, Estimated: 49 mL/min — ABNORMAL LOW (ref >60)
Glucose, Bld: 138 mg/dL — ABNORMAL HIGH (ref 70–99)
Potassium: 4.1 mmol/L (ref 3.5–5.1)
Sodium: 140 mmol/L (ref 135–145)
Total Bilirubin: 0.3 mg/dL (ref 0.3–1.2)
Total Protein: 6.9 g/dL (ref 6.5–8.1)

## 2022-07-04 LAB — CBC WITH DIFFERENTIAL (CANCER CENTER ONLY)
Abs Immature Granulocytes: 0.03 10*3/uL (ref 0.00–0.07)
Basophils Absolute: 0 10*3/uL (ref 0.0–0.1)
Basophils Relative: 1 %
Eosinophils Absolute: 0 10*3/uL (ref 0.0–0.5)
Eosinophils Relative: 0 %
HCT: 30.5 % — ABNORMAL LOW (ref 36.0–46.0)
Hemoglobin: 9.5 g/dL — ABNORMAL LOW (ref 12.0–15.0)
Immature Granulocytes: 0 %
Lymphocytes Relative: 22 %
Lymphs Abs: 1.8 10*3/uL (ref 0.7–4.0)
MCH: 29 pg (ref 26.0–34.0)
MCHC: 31.1 g/dL (ref 30.0–36.0)
MCV: 93 fL (ref 80.0–100.0)
Monocytes Absolute: 0.3 10*3/uL (ref 0.1–1.0)
Monocytes Relative: 4 %
Neutro Abs: 6 10*3/uL (ref 1.7–7.7)
Neutrophils Relative %: 73 %
Platelet Count: 268 10*3/uL (ref 150–400)
RBC: 3.28 MIL/uL — ABNORMAL LOW (ref 3.87–5.11)
RDW: 16.5 % — ABNORMAL HIGH (ref 11.5–15.5)
WBC Count: 8.2 10*3/uL (ref 4.0–10.5)
nRBC: 0 % (ref 0.0–0.2)

## 2022-07-04 LAB — RETICULOCYTES
Immature Retic Fract: 13.1 % (ref 2.3–15.9)
RBC.: 3.24 MIL/uL — ABNORMAL LOW (ref 3.87–5.11)
Retic Count, Absolute: 49.9 10*3/uL (ref 19.0–186.0)
Retic Ct Pct: 1.5 % (ref 0.4–3.1)

## 2022-07-04 LAB — IRON AND IRON BINDING CAPACITY (CC-WL,HP ONLY)
Iron: 39 ug/dL (ref 28–170)
Saturation Ratios: 12 % (ref 10.4–31.8)
TIBC: 330 ug/dL (ref 250–450)
UIBC: 291 ug/dL (ref 148–442)

## 2022-07-04 LAB — FERRITIN: Ferritin: 11 ng/mL (ref 11–307)

## 2022-07-04 MED ORDER — CYANOCOBALAMIN 1000 MCG/ML IJ SOLN
1000.0000 ug | Freq: Once | INTRAMUSCULAR | Status: AC
Start: 2022-07-04 — End: 2022-07-04
  Administered 2022-07-04: 1000 ug via INTRAMUSCULAR

## 2022-07-04 MED ORDER — DARBEPOETIN ALFA 300 MCG/0.6ML IJ SOSY
300.0000 ug | PREFILLED_SYRINGE | Freq: Once | INTRAMUSCULAR | Status: AC
  Administered 2022-07-04: 300 ug via SUBCUTANEOUS
  Filled 2022-07-04: qty 0.6

## 2022-07-04 NOTE — Progress Notes (Addendum)
Pt here for monthly B12 injection per  Abner Greenspan  B12 given IM, and pt tolerated injection well.  Next B12 injection scheduled for

## 2022-07-04 NOTE — Progress Notes (Signed)
Hematology and Oncology Follow Up Visit  [name] [name] [id number] 02-Feb-1924 89 y.o. 07/04/2022   Principle Diagnosis:  Iron deficiency anemia  Erythropoietin deficiency anemia    Current Therapy:        IV iron as indicated  Aranesp 300 mcg SQ to maintain Hgb > 11   Interim History:  [name] [name] is here today for follow-up and injection. She is doing well but does have some fatigue at times.  Occasional palpitations, rare.  No blood loss noted. No petechiae.  No fever, chills, n/v, cough, rash, dizziness, SOB, chest pain or changes in bowel or bladder habits.  She has abdominal discomfort at times associated with rectocele.  Mild puffiness in her ankles and feet. Pedal pulses are 1+.  No tenderness, numbness or tingling in her extremities.  No falls or syncope.  Appetite is fare and she had added a Boost daily. Hydration is good. Weight is stable at 84 lbs.   ECOG Performance Status: 1 - Symptomatic but completely ambulatory  Medications:  Allergies as of 07/04/2022       Reactions   Amlodipine Shortness Of Breath   Darifenacin Hydrobromide Other (See Comments)   dizziness   Sulfonamide Derivatives Other (See Comments)   dizziness   Other Other (See Comments)   dizziness   Tramadol Other (See Comments)   Insomnia, anorexia        Medication List        Accurate as of July 04, 2022 12:48 PM. If you have any questions, ask your nurse or doctor.          ALPRAZolam 0.25 MG tablet Commonly known as: XANAX TAKE 0.5-1 TABLETS (0.125-0.25 MG TOTAL) BY MOUTH 2 (TWO) TIMES DAILY AS NEEDED FOR ANXIETY.   apixaban 2.5 MG Tabs tablet Commonly known as: Eliquis Take 1 tablet (2.5 mg total) by mouth 2 (two) times daily.   diclofenac Sodium 1 % Gel Commonly known as: Voltaren Apply 2 g topically 4 (four) times daily.   famotidine 20 MG tablet Commonly known as: PEPCID Take 20 mg by mouth at bedtime.   furosemide 20 MG tablet Commonly known as: LASIX PLEASE SEE  ATTACHED FOR DETAILED DIRECTIONS   Hemocyte-F 324-1 MG Tabs Generic drug: Ferrous Fumarate-Folic Acid Take 1 tablet by mouth daily.   isosorbide mononitrate 30 MG 24 hr tablet Commonly known as: IMDUR Take 1 tablet (30 mg total) by mouth daily.   levocetirizine 5 MG tablet Commonly known as: XYZAL Take 2.5 mg by mouth daily at 12 noon.   lidocaine 5 % Commonly known as: Lidoderm Place 1 patch onto the skin daily. Remove & Discard patch within 12 hours or as directed by MD   losartan 25 MG tablet Commonly known as: COZAAR Take 1 tablet (25 mg total) by mouth daily.   metoprolol succinate 25 MG 24 hr tablet Commonly known as: TOPROL-XL TAKE 1 TABLET (25 MG TOTAL) BY MOUTH DAILY.   nystatin ointment Commonly known as: MYCOSTATIN Apply 1 Application topically 2 (two) times daily as needed.   pantoprazole 40 MG tablet Commonly known as: PROTONIX TAKE 1 TABLET BY MOUTH EVERY DAY   tiZANidine 2 MG tablet Commonly known as: ZANAFLEX Take 0.5-1 tablets (1-2 mg total) by mouth every 6 (six) hours as needed for muscle spasms.   Vitamin D 50 MCG (2000 UT) Caps Take 2,000 Units by mouth daily at 6 (six) AM.        Allergies:  Allergies  Allergen Reactions   Amlodipine  Shortness Of Breath   Darifenacin Hydrobromide Other (See Comments)    dizziness   Sulfonamide Derivatives Other (See Comments)    dizziness   Other Other (See Comments)    dizziness   Tramadol Other (See Comments)    Insomnia, anorexia    Past Medical History, Surgical history, Social history, and Family History were reviewed and updated.  Review of Systems: All other 10 point review of systems is negative.   Physical Exam:  vitals were not taken for this visit.   Wt Readings from Last 3 Encounters:  06/13/22 85 lb 6.4 oz (38.7 kg)  05/17/22 85 lb 9.6 oz (38.8 kg)  05/02/22 85 lb (38.6 kg)    Ocular: Sclerae unicteric, pupils equal, round and reactive to light Ear-nose-throat: Oropharynx  clear, dentition fair Lymphatic: No cervical or supraclavicular adenopathy Lungs no rales or rhonchi, good excursion bilaterally Heart regular rate and rhythm, no murmur appreciated Abd soft, nontender, positive bowel sounds MSK no focal spinal tenderness, no joint edema Neuro: non-focal, well-oriented, appropriate affect Breasts: Deferred   Lab Results  Component Value Date   WBC 8.2 07/04/2022   HGB 9.5 (L) 07/04/2022   HCT 30.5 (L) 07/04/2022   MCV 93.0 07/04/2022   PLT 268 07/04/2022   Lab Results  Component Value Date   FERRITIN 19 06/13/2022   IRON 59 06/13/2022   TIBC 286 06/13/2022   UIBC 227 06/13/2022   IRONPCTSAT 21 06/13/2022   Lab Results  Component Value Date   RETICCTPCT 1.5 07/04/2022   RBC 3.24 (L) 07/04/2022   No results found for: "KPAFRELGTCHN", "LAMBDASER", "KAPLAMBRATIO" No results found for: "IGGSERUM", "IGA", "IGMSERUM" No results found for: "TOTALPROTELP", "ALBUMINELP", "A1GS", "A2GS", "BETS", "BETA2SER", "GAMS", "MSPIKE", "SPEI"   Chemistry      Component Value Date/Time   NA 139 06/13/2022 1244   NA 139 04/11/2021 1312   K 4.5 06/13/2022 1244   CL 103 06/13/2022 1244   CO2 30 06/13/2022 1244   BUN 20 06/13/2022 1244   BUN 30 04/11/2021 1312   CREATININE 1.18 (H) 06/13/2022 1244   CREATININE 1.15 (H) 10/29/2019 1358      Component Value Date/Time   CALCIUM 8.8 (L) 06/13/2022 1244   ALKPHOS 17 (L) 06/13/2022 1244   AST 14 (L) 06/13/2022 1244   ALT 9 06/13/2022 1244   BILITOT 0.3 06/13/2022 1244       Impression and Plan: [name] [name] is a very pleasant 89 yo caucasian female with multifactorial anemia (IDA and erythropoietin deficiency).  ESA given today, Hgb 9.5.  Iron studies are pending.  Lab and injection every 3 weeks, follow-up in 6 weeks.    [name] Stanford, NP 7/3/202412:48 PM

## 2022-07-06 ENCOUNTER — Telehealth: Payer: Self-pay | Admitting: *Deleted

## 2022-07-06 ENCOUNTER — Other Ambulatory Visit: Payer: Self-pay | Admitting: Family

## 2022-07-06 NOTE — Telephone Encounter (Signed)
-----   Message from Erenest Blank, NP sent at 07/06/2022  9:05 AM EDT ----- Iron studies are on low end of normal with lower red blood cell count. Would she be agreeable to receiving some IV iron?   ----- Message ----- From: Interface, Lab In [place] Sent: 07/04/2022  12:47 PM EDT To: Erenest Blank, NP

## 2022-07-06 NOTE — Telephone Encounter (Signed)
Call placed to patient's daughter Gigi Gin to inform her per order of S. Montez Morita NP that [facility] iron studies are on the low end of normal with lower red blood cell count and that Maralyn Sago would like to know if pt would be agreeable to receiving IV iron.  Gigi Gin states that pt would like to receive an iron infusion.  Celene Skeen NP notified and Peggy notified that a scheduler would be contacting her to schedule an iron infusion.  Gigi Gin is appreciative of call and has no questions at this time.

## 2022-07-09 NOTE — Progress Notes (Unsigned)
Cardiology Office Note:    Date:  07/09/2022   ID:  [name] [name], DOB 09-Aug-1924, MRN [id number]  PCP:  Bradd Canary, MD   [facility] Providers Cardiologist:  Meriam Sprague, MD {    Referring MD: Bradd Canary, MD    History of Present Illness:    [name] [name] is a 89 y.o. female with a hx of persistent atrial fibrillation, HTN, sinus bradycardia, HLD, anemia, anxiety, arthritis, diverticulosis, GERD, and thyroid disease who was previously followed by Dr. Delton See who now presents to clinic for follow-up.  Per review of the record, she been felt to have had chronic atrial fib. TTE 2018 showed EF 55-60%, mild MR, moderate LAE, mild TR, normal PASP. She was seen in the ER 06/20/17 with discomfort in chest, upper abdomen and back. CT angio chest/abd/pelvis showed no acute dissection/ aneurysm, + atherosclerotic vascular disease with suspected renal artery stenosis, possible 16 mm hyperenhancing splenic mass, sigmoid diverticular disease . Out-patient w/u of splenic mass recommended. She was then re-hospitalized with weakness and decreased appetite found to be hyponatremic and slightly bradycardic.  She was treated for possible SIADH. Initially the patient was bradycardic and her beta-blocker was held but heart rate increased to greater than 100. She was seen in consultation by Dr. Anne Fu who felt that her rhythm was actually atrial flutter appearing. He advised continued rate control on low-dose Toprol and watch for bradycardia. Her Coumadin was switched to Eliquis during begininng of Covid pandemic. Event monitoring in 09/2018 with findings of persistent atrial flutter with variable block with minimum HR 45, maximum HR 136 and average HR 65 BPM. 1 episode of ventricular tachycardia lasting 8 beats.  Was last seen in clinic on 04/2021 where she was doing well from a CV standpoint.  Was hospitalized on 09/18-09/19/23 for hemoglobin 5.2. Colonoscopy was deferred due to age. She was  started on PO iron. Had follow-up with heme as well. Apixaban was not stopped.   Was last seen in clinic on 10/2021 where she was stable from a CV standpoint. Discussed coming off of apixaban, but she wished to continue the medication.  Today, ***   Past Medical History:  Diagnosis Date   Anemia    Anxiety    Arthritis    hips, knees   Current use of long term anticoagulation    Diverticulitis of colon 02/06/2015   Gait difficulty    GERD (gastroesophageal reflux disease)    Headache(784.0) 06/22/2012   Hyperlipidemia    Hypertension    IBS (irritable bowel syndrome)    Incontinence    Loss of weight 06/05/2015   Medicare annual wellness visit, subsequent 02/05/2014   Melena    Mild mitral regurgitation    Mild tricuspid regurgitation    Mitral and aortic regurgitation    Mitral valve prolapse    hx of - not seen on recent echoes   Osteopenia    Paroxysmal atrial flutter (HCC)    Pedal edema 10/01/2016   Persistent atrial fibrillation (HCC)    Personal history of colonic polyps    Renal lithiasis 06/26/2015   Sinus bradycardia    Thyroid disease    hypo   Vitamin B12 deficiency 03/16/2016   Vitamin D deficiency     Past Surgical History:  Procedure Laterality Date   BOTOX INJECTION N/A 09/03/2012   Procedure: BOTOX INJECTION;  Surgeon: Louis Meckel, MD;  Location: [facility];  Service: Endoscopy;  Laterality: N/A;   BREAST BIOPSY Left  BREAST LUMPECTOMY Right 08/18/2014   Procedure: RIGHT BREAST LUMPECTOMY;  Surgeon: Abigail Miyamoto, MD;  Location: [facility];  Service: General;  Laterality: Right;   CARDIAC ELECTROPHYSIOLOGY MAPPING AND ABLATION     CATARACT EXTRACTION, BILATERAL     ESOPHAGOGASTRODUODENOSCOPY N/A 09/03/2012   Procedure: ESOPHAGOGASTRODUODENOSCOPY (EGD);  Surgeon: Louis Meckel, MD;  Location: [facility];  Service: Endoscopy;  Laterality: N/A;   ESOPHAGOGASTRODUODENOSCOPY (EGD) WITH PROPOFOL N/A 12/02/2018   Procedure:  ESOPHAGOGASTRODUODENOSCOPY (EGD) WITH PROPOFOL;  Surgeon: Sherrilyn Rist, MD;  Location: [facility];  Service: Gastroenterology;  Laterality: N/A;   ESOPHAGOSCOPY W/ BOTOX INJECTION     I & D EXTREMITY Right 11/06/2012   Procedure: IRRIGATION AND DEBRIDEMENT EXTREMITY Right Ring Finger;  Surgeon: Tami Ribas, MD;  Location: [facility];  Service: Orthopedics;  Laterality: Right;   PARTIAL HYSTERECTOMY     ovaries left in place   skin cancer removal      Current Medications: No outpatient medications have been marked as taking for the 07/11/22 encounter (Appointment) with Meriam Sprague, MD.     Allergies:   Amlodipine, Darifenacin hydrobromide, Sulfonamide derivatives, Other, and Tramadol   Social History   Socioeconomic History   Marital status: Widowed    Spouse name: Not on file   Number of children: 5   Years of education: Not on file   Highest education level: Not on file  Occupational History   Occupation: retired    Associate Professor: RETIRED  Tobacco Use   Smoking status: Never   Smokeless tobacco: Never  Vaping Use   Vaping Use: Never used  Substance and Sexual Activity   Alcohol use: No   Drug use: No   Sexual activity: Not Currently    Comment: lives with Daughter, grandson and his family. no dietary restricitons.  Other Topics Concern   Not on file  Social History Narrative   Not on file   Social Determinants of Health   Financial Resource Strain: Low Risk  (12/11/2021)   Overall Financial Resource Strain (CARDIA)    Difficulty of Paying Living Expenses: Not hard at all  Food Insecurity: No Food Insecurity (12/11/2021)   Hunger Vital Sign    Worried About Running Out of Food in the Last Year: Never true    Ran Out of Food in the Last Year: Never true  Transportation Needs: No Transportation Needs (12/11/2021)   PRAPARE - Administrator, Civil Service (Medical): No    Lack of Transportation (Non-Medical): No  Physical Activity: Inactive  (12/11/2021)   Exercise Vital Sign    Days of Exercise per Week: 0 days    Minutes of Exercise per Session: 0 min  Stress: No Stress Concern Present (12/11/2021)   [employer] of Occupational Health - Occupational Stress Questionnaire    Feeling of Stress : Not at all  Social Connections: Socially Isolated (12/11/2021)   Social Connection and Isolation Panel [NHANES]    Frequency of Communication with Friends and Family: Once a week    Frequency of Social Gatherings with Friends and Family: Once a week    Attends Religious Services: Never    Database administrator or Organizations: No    Attends Banker Meetings: Never    Marital Status: Widowed     Family History: The patient's family history includes Allergies in her daughter; COPD in her daughter, daughter, and father; CVA in her mother; Diabetes in her mother; Heart disease in her maternal  aunt and maternal uncle; Rheum arthritis in her father; Stroke in her daughter, maternal grandfather, and mother. There is no history of Colon cancer, Colon polyps, Esophageal cancer, Gallbladder disease, Kidney disease, Heart attack, or Hypertension.  ROS:   Review of Systems  Constitutional:  Negative for chills, diaphoresis and fever.  HENT:  Negative for congestion, nosebleeds and sore throat.   Eyes:  Negative for discharge.  Respiratory:  Negative for cough and shortness of breath.   Cardiovascular:  Positive for leg swelling. Negative for chest pain, palpitations, orthopnea, claudication and PND.  Gastrointestinal:  Positive for blood in stool (Recent; not sure if recurring). Negative for abdominal pain, diarrhea, heartburn, nausea and vomiting.  Genitourinary:  Negative for dysuria, hematuria and urgency.  Musculoskeletal:  Negative for falls.  Neurological:  Negative for dizziness and seizures.  Endo/Heme/Allergies:  Negative for environmental allergies.  Psychiatric/Behavioral:  Negative for depression and  hallucinations.      EKGs/Labs/Other Studies Reviewed:    The following studies were reviewed today:  Bilateral LE Venous Doppler 03/22/2021: IMPRESSION: No evidence of deep venous thrombosis in either lower extremity.  CT Abdomen/Pelvis 08/17/2020: IMPRESSION: Findings suspicious for mild diffuse colitis. No evidence of abscess or other complication.   Colonic diverticulosis, without radiographic evidence of diverticulitis.   Stable large diverticulum arising from the lower thoracic esophagus.   Aortic Atherosclerosis (ICD10-I70.0).  Monitor 09/2018 Study Highlights   Persistent atrial flutter with variable block with minimum HR 45, maximum HR 136 and average HR 65 BPM. 1 episode of ventricular tachycardia lasting 8 beats.   Persistent atrial flutter with variable block with minimum HR 45, maximum HR 136 and average HR 65 BPM. Continue current medical management.      Echo 2018 Study Conclusions   - Left ventricle: The cavity size was normal. There was mild    concentric hypertrophy. Systolic function was normal. The    estimated ejection fraction was in the range of 55% to 60%. Wall    motion was normal; there were no regional wall motion    abnormalities. The study is not technically sufficient to allow    evaluation of LV diastolic function.  - Aortic valve: Transvalvular velocity was within the normal range.    There was no stenosis. There was no regurgitation.  - Mitral valve: Transvalvular velocity was within the normal range.    There was no evidence for stenosis. There was mild regurgitation.  - Left atrium: The atrium was moderately dilated.  - Right ventricle: Systolic function was normal.  - Atrial septum: No defect or patent foramen ovale was identified.  - Tricuspid valve: There was mild regurgitation.  - Pulmonary arteries: Systolic pressure was within the normal    range. PA peak pressure: 36 mm Hg (S).     EKGs    ***  Recent Labs: 05/17/2022: TSH  5.28 07/04/2022: ALT 8; BUN 21; Creatinine 1.05; Hemoglobin 9.5; Platelet Count 268; Potassium 4.1; Sodium 140   Recent Lipid Panel    Component Value Date/Time   CHOL 112 05/17/2022 1140   TRIG 76.0 05/17/2022 1140   HDL 40.50 05/17/2022 1140   CHOLHDL 3 05/17/2022 1140   VLDL 15.2 05/17/2022 1140   LDLCALC 56 05/17/2022 1140   LDLCALC 72 10/29/2019 1358   LDLDIRECT 72.1 01/19/2009 1026     Physical Exam:    VS:  There were no vitals taken for this visit.    Wt Readings from Last 3 Encounters:  07/04/22 84 lb 12.8 oz (38.5  kg)  06/13/22 85 lb 6.4 oz (38.7 kg)  05/17/22 85 lb 9.6 oz (38.8 kg)     GEN: Elderly, comfortable, NAD HEENT: Normal NECK: No JVD; No carotid bruits CARDIAC: Irregular, 2/6 systolic murmur, No rubs, No gallops RESPIRATORY:  Clear to auscultation without rales, wheezing or rhonchi  ABDOMEN: Soft, non-tender, non-distended MUSCULOSKELETAL:  No edema; No deformity  SKIN: Warm and dry NEUROLOGIC:  Alert and oriented x 3 PSYCHIATRIC:  Normal affect   ASSESSMENT:    No diagnosis found.    PLAN:    In order of problems listed above:  #Permanent Afib: #Paroxysmal Aflutter: CHADs-vasc 5. On eliquis and low dose metop. She would like to continue apixaban for now but will plan to stop if re-bleed -Continue apixaban 2.5mg  BID per patient preference; will plan to stop it if any signs of rebleed -Continue metop 25mg  XL daily  #Recent GIB: #Iron Deficiency Anemia: Patient with admission 09/2021 for hemoglobin 5.2. Received 2 units pRBCs and was managed conservatively. Had follow-up with oncology and was continued on oral iron and received IV iron as well. We discussed the risks vs benefits of stopping the apixaban as it is completely reasonable at her age. She would like to continue it for now, but will likely stop it if any signs of re-bleed. -Management per heme -She would like to continue the apixaban for now but likely will stop if any evidence of  re-bleed which is completely reasonable.   #HTN: Controlled and at goal. No episodes of orthostasis. -Continue losartan 25mg  daily -Continue metop 25mg  XL daily -Continue imdur 30mg  daily  #HLD: -Not on statin; given age, will not re-check lipids  #Mild MR: Asymptomatic. No further monitoring indicated unless clinical change.  Follow-up: 6 months.  Medication Adjustments/Labs and Tests Ordered: Current medicines are reviewed at length with the patient today.  Concerns regarding medicines are outlined above.   No orders of the defined types were placed in this encounter.  No orders of the defined types were placed in this encounter.  There are no Patient Instructions on file for this visit.   Signed, Meriam Sprague, MD  07/09/2022 9:05 PM    [facility]

## 2022-07-11 ENCOUNTER — Encounter: Payer: Self-pay | Admitting: Cardiology

## 2022-07-11 ENCOUNTER — Ambulatory Visit: Payer: Medicare Other | Attending: Cardiology | Admitting: Cardiology

## 2022-07-11 VITALS — BP 120/72 | HR 72 | Ht 59.0 in | Wt 84.8 lb

## 2022-07-11 DIAGNOSIS — I10 Essential (primary) hypertension: Secondary | ICD-10-CM | POA: Diagnosis not present

## 2022-07-11 DIAGNOSIS — D500 Iron deficiency anemia secondary to blood loss (chronic): Secondary | ICD-10-CM | POA: Diagnosis not present

## 2022-07-11 DIAGNOSIS — D6869 Other thrombophilia: Secondary | ICD-10-CM

## 2022-07-11 DIAGNOSIS — E782 Mixed hyperlipidemia: Secondary | ICD-10-CM

## 2022-07-11 DIAGNOSIS — I4821 Permanent atrial fibrillation: Secondary | ICD-10-CM

## 2022-07-11 DIAGNOSIS — I340 Nonrheumatic mitral (valve) insufficiency: Secondary | ICD-10-CM

## 2022-07-11 NOTE — Patient Instructions (Signed)
Medication Instructions:  Your physician recommends that you continue on your current medications as directed. Please refer to the Current Medication list given to you today.  *If you need a refill on your cardiac medications before your next appointment, please call your pharmacy*  Lab Work: None ordered today  Testing/Procedures: None ordered today  Follow-Up: At [facility], you and your health needs are our priority.  As part of our continuing mission to provide you with exceptional heart care, we have created designated Provider Care Teams.  These Care Teams include your primary Cardiologist (physician) and Advanced Practice Providers (APPs -  Physician Assistants and Nurse Practitioners) who all work together to provide you with the care you need, when you need it.  Your next appointment:   6 month(s)  The format for your next appointment:   In Person  Provider:   Paula Ross, MD  

## 2022-07-13 ENCOUNTER — Inpatient Hospital Stay: Payer: Medicare Other

## 2022-07-13 VITALS — BP 140/53 | HR 65 | Temp 98.2°F

## 2022-07-13 DIAGNOSIS — Z79899 Other long term (current) drug therapy: Secondary | ICD-10-CM | POA: Diagnosis not present

## 2022-07-13 DIAGNOSIS — D631 Anemia in chronic kidney disease: Secondary | ICD-10-CM | POA: Diagnosis not present

## 2022-07-13 DIAGNOSIS — Z7901 Long term (current) use of anticoagulants: Secondary | ICD-10-CM

## 2022-07-13 DIAGNOSIS — N1831 Chronic kidney disease, stage 3a: Secondary | ICD-10-CM | POA: Diagnosis not present

## 2022-07-13 DIAGNOSIS — E611 Iron deficiency: Secondary | ICD-10-CM | POA: Diagnosis not present

## 2022-07-13 MED ORDER — SODIUM CHLORIDE 0.9 % IV SOLN: Freq: Once | INTRAVENOUS | Status: AC

## 2022-07-13 MED ORDER — SODIUM CHLORIDE 0.9 % IV SOLN
200.0000 mg | Freq: Once | INTRAVENOUS | Status: AC
  Administered 2022-07-13: 200 mg via INTRAVENOUS
  Filled 2022-07-13: qty 200

## 2022-07-13 NOTE — Patient Instructions (Signed)

## 2022-07-18 DIAGNOSIS — D492 Neoplasm of unspecified behavior of bone, soft tissue, and skin: Secondary | ICD-10-CM | POA: Diagnosis not present

## 2022-07-18 DIAGNOSIS — D225 Melanocytic nevi of trunk: Secondary | ICD-10-CM | POA: Diagnosis not present

## 2022-07-18 DIAGNOSIS — L821 Other seborrheic keratosis: Secondary | ICD-10-CM | POA: Diagnosis not present

## 2022-07-18 DIAGNOSIS — Z08 Encounter for follow-up examination after completed treatment for malignant neoplasm: Secondary | ICD-10-CM | POA: Diagnosis not present

## 2022-07-18 DIAGNOSIS — Z85828 Personal history of other malignant neoplasm of skin: Secondary | ICD-10-CM | POA: Diagnosis not present

## 2022-07-18 DIAGNOSIS — L814 Other melanin hyperpigmentation: Secondary | ICD-10-CM | POA: Diagnosis not present

## 2022-07-18 DIAGNOSIS — C44722 Squamous cell carcinoma of skin of right lower limb, including hip: Secondary | ICD-10-CM | POA: Diagnosis not present

## 2022-07-18 NOTE — Progress Notes (Signed)
[name] [name] is a 89 y.o. female presents to the office today for 3/4 biweekly B12 injection, per physician's orders. Original order: 05/17/22 :"Notify Vitamin B12 is low. Start vitamin B12 shots, 1000 mcg shots IM weekly x 4 doses then biweekly x 4 doses then monthly. Recheck Vitamin B12 level again in roughly 2 weeks." Cyanocobalamin 1000 mg/ml IM was administered R deltoid today. Patient tolerated injection. Patient due for follow up labs/provider appt: No.  Patient next injection due: 2 weeks for 4th Biweekly B12 injection , appt made Yes

## 2022-07-19 ENCOUNTER — Ambulatory Visit (INDEPENDENT_AMBULATORY_CARE_PROVIDER_SITE_OTHER): Payer: Medicare Other

## 2022-07-19 DIAGNOSIS — E538 Deficiency of other specified B group vitamins: Secondary | ICD-10-CM

## 2022-07-19 MED ORDER — CYANOCOBALAMIN 1000 MCG/ML IJ SOLN
1000.0000 ug | Freq: Once | INTRAMUSCULAR | Status: AC
Start: 2022-07-19 — End: 2022-07-19
  Administered 2022-07-19: 1000 ug via INTRAMUSCULAR

## 2022-07-20 ENCOUNTER — Inpatient Hospital Stay: Payer: Medicare Other

## 2022-07-20 VITALS — BP 146/43 | HR 70 | Temp 98.0°F | Resp 18

## 2022-07-20 DIAGNOSIS — E611 Iron deficiency: Secondary | ICD-10-CM | POA: Diagnosis not present

## 2022-07-20 DIAGNOSIS — N1831 Chronic kidney disease, stage 3a: Secondary | ICD-10-CM | POA: Diagnosis not present

## 2022-07-20 DIAGNOSIS — Z7901 Long term (current) use of anticoagulants: Secondary | ICD-10-CM

## 2022-07-20 DIAGNOSIS — Z79899 Other long term (current) drug therapy: Secondary | ICD-10-CM | POA: Diagnosis not present

## 2022-07-20 DIAGNOSIS — D631 Anemia in chronic kidney disease: Secondary | ICD-10-CM | POA: Diagnosis not present

## 2022-07-20 MED ORDER — SODIUM CHLORIDE 0.9 % IV SOLN
200.0000 mg | Freq: Once | INTRAVENOUS | Status: AC
  Administered 2022-07-20: 200 mg via INTRAVENOUS
  Filled 2022-07-20: qty 10

## 2022-07-20 MED ORDER — SODIUM CHLORIDE 0.9 % IV SOLN: Freq: Once | INTRAVENOUS | Status: AC

## 2022-07-20 MED ORDER — DARBEPOETIN ALFA 300 MCG/0.6ML IJ SOSY: 300.0000 ug | PREFILLED_SYRINGE | Freq: Once | INTRAMUSCULAR | Status: DC

## 2022-07-20 NOTE — Patient Instructions (Signed)
Iron Sucrose Injection What is this medication? IRON SUCROSE (EYE ern SOO krose) treats low levels of iron (iron deficiency anemia) in people with kidney disease. Iron is a mineral that plays an important role in making red blood cells, which carry oxygen from your lungs to the rest of your body. This medicine may be used for other purposes; ask your health care provider or pharmacist if you have questions. COMMON BRAND NAME(S): Venofer What should I tell my care team before I take this medication? They need to know if you have any of these conditions: Anemia not caused by low iron levels Heart disease High levels of iron in the blood Kidney disease Liver disease An unusual or allergic reaction to iron, other medications, foods, dyes, or preservatives Pregnant or trying to get pregnant Breastfeeding How should I use this medication? This medication is for infusion into a vein. It is given in a hospital or clinic setting. Talk to your care team about the use of this medication in children. While this medication may be prescribed for children as young as 2 years for selected conditions, precautions do apply. Overdosage: If you think you have taken too much of this medicine contact a poison control center or emergency room at once. NOTE: This medicine is only for you. Do not share this medicine with others. What if I miss a dose? Keep appointments for follow-up doses. It is important not to miss your dose. Call your care team if you are unable to keep an appointment. What may interact with this medication? Do not take this medication with any of the following: Deferoxamine Dimercaprol Other iron products This medication may also interact with the following: Chloramphenicol Deferasirox This list may not describe all possible interactions. Give your health care provider a list of all the medicines, herbs, non-prescription drugs, or dietary supplements you use. Also tell them if you smoke,  drink alcohol, or use illegal drugs. Some items may interact with your medicine. What should I watch for while using this medication? Visit your care team regularly. Tell your care team if your symptoms do not start to get better or if they get worse. You may need blood work done while you are taking this medication. You may need to follow a special diet. Talk to your care team. Foods that contain iron include: whole grains/cereals, dried fruits, beans, or peas, leafy green vegetables, and organ meats (liver, kidney). What side effects may I notice from receiving this medication? Side effects that you should report to your care team as soon as possible: Allergic reactions--skin rash, itching, hives, swelling of the face, lips, tongue, or throat Low blood pressure--dizziness, feeling faint or lightheaded, blurry vision Shortness of breath Side effects that usually do not require medical attention (report to your care team if they continue or are bothersome): Flushing Headache Joint pain Muscle pain Nausea Pain, redness, or irritation at injection site This list may not describe all possible side effects. Call your doctor for medical advice about side effects. You may report side effects to FDA at 1-800-FDA-1088. Where should I keep my medication? This medication is given in a hospital or clinic. It will not be stored at home. NOTE: This sheet is a summary. It may not cover all possible information. If you have questions about this medicine, talk to your doctor, pharmacist, or health care provider.  2024 Elsevier/Gold Standard (2022-05-25 00:00:00)

## 2022-07-25 ENCOUNTER — Inpatient Hospital Stay: Payer: Medicare Other

## 2022-07-25 VITALS — BP 140/59 | HR 89 | Temp 97.9°F | Resp 18

## 2022-07-25 DIAGNOSIS — D631 Anemia in chronic kidney disease: Secondary | ICD-10-CM

## 2022-07-25 DIAGNOSIS — Z7901 Long term (current) use of anticoagulants: Secondary | ICD-10-CM

## 2022-07-25 DIAGNOSIS — Z79899 Other long term (current) drug therapy: Secondary | ICD-10-CM | POA: Diagnosis not present

## 2022-07-25 DIAGNOSIS — N1831 Chronic kidney disease, stage 3a: Secondary | ICD-10-CM | POA: Diagnosis not present

## 2022-07-25 DIAGNOSIS — D492 Neoplasm of unspecified behavior of bone, soft tissue, and skin: Secondary | ICD-10-CM | POA: Diagnosis not present

## 2022-07-25 DIAGNOSIS — E611 Iron deficiency: Secondary | ICD-10-CM | POA: Diagnosis not present

## 2022-07-25 DIAGNOSIS — D500 Iron deficiency anemia secondary to blood loss (chronic): Secondary | ICD-10-CM

## 2022-07-25 LAB — CMP (CANCER CENTER ONLY)
ALT: 8 U/L (ref 0–44)
AST: 14 U/L — ABNORMAL LOW (ref 15–41)
Albumin: 3.4 g/dL — ABNORMAL LOW (ref 3.5–5.0)
Alkaline Phosphatase: 14 U/L — ABNORMAL LOW (ref 38–126)
Anion gap: 8 (ref 5–15)
BUN: 22 mg/dL (ref 8–23)
CO2: 31 mmol/L (ref 22–32)
Calcium: 9.2 mg/dL (ref 8.9–10.3)
Chloride: 103 mmol/L (ref 98–111)
Creatinine: 1.23 mg/dL — ABNORMAL HIGH (ref 0.44–1.00)
GFR, Estimated: 40 mL/min — ABNORMAL LOW (ref >60)
Glucose, Bld: 105 mg/dL — ABNORMAL HIGH (ref 70–99)
Potassium: 3.9 mmol/L (ref 3.5–5.1)
Sodium: 142 mmol/L (ref 135–145)
Total Bilirubin: 0.4 mg/dL (ref 0.3–1.2)
Total Protein: 6.9 g/dL (ref 6.5–8.1)

## 2022-07-25 LAB — CBC WITH DIFFERENTIAL (CANCER CENTER ONLY)
Abs Immature Granulocytes: 0.02 10*3/uL (ref 0.00–0.07)
Basophils Absolute: 0 10*3/uL (ref 0.0–0.1)
Basophils Relative: 0 %
Eosinophils Absolute: 0 10*3/uL (ref 0.0–0.5)
Eosinophils Relative: 0 %
HCT: 33.6 % — ABNORMAL LOW (ref 36.0–46.0)
Hemoglobin: 10.3 g/dL — ABNORMAL LOW (ref 12.0–15.0)
Immature Granulocytes: 0 %
Lymphocytes Relative: 14 %
Lymphs Abs: 1.3 10*3/uL (ref 0.7–4.0)
MCH: 29.3 pg (ref 26.0–34.0)
MCHC: 30.7 g/dL (ref 30.0–36.0)
MCV: 95.7 fL (ref 80.0–100.0)
Monocytes Absolute: 0.5 10*3/uL (ref 0.1–1.0)
Monocytes Relative: 6 %
Neutro Abs: 7.2 10*3/uL (ref 1.7–7.7)
Neutrophils Relative %: 80 %
Platelet Count: 251 10*3/uL (ref 150–400)
RBC: 3.51 MIL/uL — ABNORMAL LOW (ref 3.87–5.11)
RDW: 17.7 % — ABNORMAL HIGH (ref 11.5–15.5)
WBC Count: 9.1 10*3/uL (ref 4.0–10.5)
nRBC: 0 % (ref 0.0–0.2)

## 2022-07-25 MED ORDER — DARBEPOETIN ALFA 300 MCG/0.6ML IJ SOSY
300.0000 ug | PREFILLED_SYRINGE | Freq: Once | INTRAMUSCULAR | Status: AC
  Administered 2022-07-25: 300 ug via SUBCUTANEOUS
  Filled 2022-07-25: qty 0.6

## 2022-07-25 NOTE — Patient Instructions (Signed)

## 2022-07-26 ENCOUNTER — Ambulatory Visit: Payer: Medicare Other

## 2022-07-27 ENCOUNTER — Inpatient Hospital Stay: Payer: Medicare Other

## 2022-07-27 VITALS — BP 163/54 | Temp 97.9°F | Resp 19

## 2022-07-27 DIAGNOSIS — Z7901 Long term (current) use of anticoagulants: Secondary | ICD-10-CM

## 2022-07-27 MED ORDER — SODIUM CHLORIDE 0.9 % IV SOLN
200.0000 mg | Freq: Once | INTRAVENOUS | Status: DC
  Filled 2022-07-27: qty 10

## 2022-07-27 MED ORDER — SODIUM CHLORIDE 0.9 % IV SOLN: Freq: Once | INTRAVENOUS | Status: DC

## 2022-07-27 NOTE — Progress Notes (Signed)
Patient came today for IV iron (Venofer). Patient was stuck seven times for a PIV by four different nurses. The nurses made the decision not to stick anymore. Patient and daughter agreed. We will reschedule this appointment for next week. Instructed patient to hydrate over the weekend. She verbalized understanding.

## 2022-08-01 ENCOUNTER — Encounter (INDEPENDENT_AMBULATORY_CARE_PROVIDER_SITE_OTHER): Payer: Self-pay

## 2022-08-03 ENCOUNTER — Inpatient Hospital Stay: Payer: Medicare Other | Attending: Hematology & Oncology

## 2022-08-03 VITALS — BP 138/60 | HR 88 | Temp 98.2°F | Resp 18

## 2022-08-03 DIAGNOSIS — Z7901 Long term (current) use of anticoagulants: Secondary | ICD-10-CM

## 2022-08-03 DIAGNOSIS — E611 Iron deficiency: Secondary | ICD-10-CM | POA: Insufficient documentation

## 2022-08-03 DIAGNOSIS — D631 Anemia in chronic kidney disease: Secondary | ICD-10-CM | POA: Diagnosis not present

## 2022-08-03 DIAGNOSIS — N1831 Chronic kidney disease, stage 3a: Secondary | ICD-10-CM | POA: Insufficient documentation

## 2022-08-03 MED ORDER — SODIUM CHLORIDE 0.9 % IV SOLN
200.0000 mg | Freq: Once | INTRAVENOUS | Status: AC
  Administered 2022-08-03: 200 mg via INTRAVENOUS
  Filled 2022-08-03: qty 200

## 2022-08-03 MED ORDER — SODIUM CHLORIDE 0.9 % IV SOLN: INTRAVENOUS | Status: DC

## 2022-08-03 NOTE — Patient Instructions (Signed)
Iron Sucrose Injection What is this medication? IRON SUCROSE (EYE ern SOO krose) treats low levels of iron (iron deficiency anemia) in people with kidney disease. Iron is a mineral that plays an important role in making red blood cells, which carry oxygen from your lungs to the rest of your body. This medicine may be used for other purposes; ask your health care provider or pharmacist if you have questions. COMMON BRAND NAME(S): Venofer What should I tell my care team before I take this medication? They need to know if you have any of these conditions: Anemia not caused by low iron levels Heart disease High levels of iron in the blood Kidney disease Liver disease An unusual or allergic reaction to iron, other medications, foods, dyes, or preservatives Pregnant or trying to get pregnant Breastfeeding How should I use this medication? This medication is for infusion into a vein. It is given in a hospital or clinic setting. Talk to your care team about the use of this medication in children. While this medication may be prescribed for children as young as 2 years for selected conditions, precautions do apply. Overdosage: If you think you have taken too much of this medicine contact a poison control center or emergency room at once. NOTE: This medicine is only for you. Do not share this medicine with others. What if I miss a dose? Keep appointments for follow-up doses. It is important not to miss your dose. Call your care team if you are unable to keep an appointment. What may interact with this medication? Do not take this medication with any of the following: Deferoxamine Dimercaprol Other iron products This medication may also interact with the following: Chloramphenicol Deferasirox This list may not describe all possible interactions. Give your health care provider a list of all the medicines, herbs, non-prescription drugs, or dietary supplements you use. Also tell them if you smoke,  drink alcohol, or use illegal drugs. Some items may interact with your medicine. What should I watch for while using this medication? Visit your care team regularly. Tell your care team if your symptoms do not start to get better or if they get worse. You may need blood work done while you are taking this medication. You may need to follow a special diet. Talk to your care team. Foods that contain iron include: whole grains/cereals, dried fruits, beans, or peas, leafy green vegetables, and organ meats (liver, kidney). What side effects may I notice from receiving this medication? Side effects that you should report to your care team as soon as possible: Allergic reactions--skin rash, itching, hives, swelling of the face, lips, tongue, or throat Low blood pressure--dizziness, feeling faint or lightheaded, blurry vision Shortness of breath Side effects that usually do not require medical attention (report to your care team if they continue or are bothersome): Flushing Headache Joint pain Muscle pain Nausea Pain, redness, or irritation at injection site This list may not describe all possible side effects. Call your doctor for medical advice about side effects. You may report side effects to FDA at 1-800-FDA-1088. Where should I keep my medication? This medication is given in a hospital or clinic. It will not be stored at home. NOTE: This sheet is a summary. It may not cover all possible information. If you have questions about this medicine, talk to your doctor, pharmacist, or health care provider.  2024 Elsevier/Gold Standard (2022-05-25 00:00:00)

## 2022-08-14 ENCOUNTER — Inpatient Hospital Stay: Payer: Medicare Other

## 2022-08-14 ENCOUNTER — Inpatient Hospital Stay (HOSPITAL_BASED_OUTPATIENT_CLINIC_OR_DEPARTMENT_OTHER): Payer: Medicare Other | Admitting: Family

## 2022-08-14 ENCOUNTER — Encounter: Payer: Self-pay | Admitting: Family

## 2022-08-14 VITALS — BP 147/73 | HR 97 | Temp 97.6°F | Resp 18 | Wt 82.2 lb

## 2022-08-14 DIAGNOSIS — E611 Iron deficiency: Secondary | ICD-10-CM | POA: Diagnosis not present

## 2022-08-14 DIAGNOSIS — D631 Anemia in chronic kidney disease: Secondary | ICD-10-CM | POA: Diagnosis not present

## 2022-08-14 DIAGNOSIS — D500 Iron deficiency anemia secondary to blood loss (chronic): Secondary | ICD-10-CM

## 2022-08-14 DIAGNOSIS — N1831 Chronic kidney disease, stage 3a: Secondary | ICD-10-CM | POA: Diagnosis not present

## 2022-08-14 LAB — CBC WITH DIFFERENTIAL (CANCER CENTER ONLY)
Abs Immature Granulocytes: 0.02 K/uL (ref 0.00–0.07)
Basophils Absolute: 0.1 K/uL (ref 0.0–0.1)
Basophils Relative: 1 %
Eosinophils Absolute: 0 K/uL (ref 0.0–0.5)
Eosinophils Relative: 0 %
HCT: 36.9 % (ref 36.0–46.0)
Hemoglobin: 11.4 g/dL — ABNORMAL LOW (ref 12.0–15.0)
Immature Granulocytes: 0 %
Lymphocytes Relative: 14 %
Lymphs Abs: 1.1 K/uL (ref 0.7–4.0)
MCH: 30.1 pg (ref 26.0–34.0)
MCHC: 30.9 g/dL (ref 30.0–36.0)
MCV: 97.4 fL (ref 80.0–100.0)
Monocytes Absolute: 0.4 K/uL (ref 0.1–1.0)
Monocytes Relative: 5 %
Neutro Abs: 6.4 K/uL (ref 1.7–7.7)
Neutrophils Relative %: 80 %
Platelet Count: 220 K/uL (ref 150–400)
RBC: 3.79 MIL/uL — ABNORMAL LOW (ref 3.87–5.11)
RDW: 17.8 % — ABNORMAL HIGH (ref 11.5–15.5)
WBC Count: 7.9 K/uL (ref 4.0–10.5)
nRBC: 0 % (ref 0.0–0.2)

## 2022-08-14 LAB — RETICULOCYTES
Immature Retic Fract: 10.3 % (ref 2.3–15.9)
RBC.: 3.85 MIL/uL — ABNORMAL LOW (ref 3.87–5.11)
Retic Count, Absolute: 62 K/uL (ref 19.0–186.0)
Retic Ct Pct: 1.6 % (ref 0.4–3.1)

## 2022-08-14 LAB — IRON AND IRON BINDING CAPACITY (CC-WL,HP ONLY)
Iron: 63 ug/dL (ref 28–170)
Saturation Ratios: 21 % (ref 10.4–31.8)
TIBC: 298 ug/dL (ref 250–450)
UIBC: 235 ug/dL (ref 148–442)

## 2022-08-14 LAB — CMP (CANCER CENTER ONLY)
ALT: 6 U/L (ref 0–44)
AST: 14 U/L — ABNORMAL LOW (ref 15–41)
Albumin: 3.4 g/dL — ABNORMAL LOW (ref 3.5–5.0)
Alkaline Phosphatase: 16 U/L — ABNORMAL LOW (ref 38–126)
Anion gap: 7 (ref 5–15)
BUN: 30 mg/dL — ABNORMAL HIGH (ref 8–23)
CO2: 31 mmol/L (ref 22–32)
Calcium: 9 mg/dL (ref 8.9–10.3)
Chloride: 103 mmol/L (ref 98–111)
Creatinine: 1.13 mg/dL — ABNORMAL HIGH (ref 0.44–1.00)
GFR, Estimated: 45 mL/min — ABNORMAL LOW
Glucose, Bld: 125 mg/dL — ABNORMAL HIGH (ref 70–99)
Potassium: 4.2 mmol/L (ref 3.5–5.1)
Sodium: 141 mmol/L (ref 135–145)
Total Bilirubin: 0.5 mg/dL (ref 0.3–1.2)
Total Protein: 7.1 g/dL (ref 6.5–8.1)

## 2022-08-14 LAB — FERRITIN: Ferritin: 96 ng/mL (ref 11–307)

## 2022-08-14 NOTE — Progress Notes (Signed)
Hematology and Oncology Follow Up Visit  [name] [name] [id number] 02-12-1924 89 y.o. 08/14/2022   Principle Diagnosis:  Iron deficiency anemia  Erythropoietin deficiency anemia    Current Therapy:        IV iron as indicated  Aranesp 300 mcg SQ to maintain Hgb > 11   Interim History:  [name] [name] is here today with her daughter for follow-up. She is doing well and has no new complaints at this time.  She has fatigue at times and will rest when needed.  No blood loss noted. No bruising or petechiae.  No fever, chills, n/v, cough, rash, dizziness, SOB, chest pain or changes in bowel or bladder habits.  Intermittent abdominal pain with rectocele unchanged from baseline.  No swelling, tenderness in her extremities.  No falls or syncope.  Appetite and hydration are good right now. Weight is 82 lbs.   ECOG Performance Status: 1 - Symptomatic but completely ambulatory  Medications:  Allergies as of 08/14/2022       Reactions   Amlodipine Shortness Of Breath   Darifenacin Hydrobromide Other (See Comments)   dizziness   Sulfonamide Derivatives Other (See Comments)   dizziness   Other Other (See Comments)   dizziness   Tramadol Other (See Comments)   Insomnia, anorexia        Medication List        Accurate as of August 14, 2022  1:09 PM. If you have any questions, ask your nurse or doctor.          ALPRAZolam 0.25 MG tablet Commonly known as: XANAX TAKE 0.5-1 TABLETS (0.125-0.25 MG TOTAL) BY MOUTH 2 (TWO) TIMES DAILY AS NEEDED FOR ANXIETY.   apixaban 2.5 MG Tabs tablet Commonly known as: Eliquis Take 1 tablet (2.5 mg total) by mouth 2 (two) times daily.   diclofenac Sodium 1 % Gel Commonly known as: Voltaren Apply 2 g topically 4 (four) times daily.   famotidine 20 MG tablet Commonly known as: PEPCID Take 20 mg by mouth at bedtime.   furosemide 20 MG tablet Commonly known as: LASIX PLEASE SEE ATTACHED FOR DETAILED DIRECTIONS   Hemocyte-F 324-1 MG  Tabs Generic drug: Ferrous Fumarate-Folic Acid Take 1 tablet by mouth daily.   isosorbide mononitrate 30 MG 24 hr tablet Commonly known as: IMDUR Take 1 tablet (30 mg total) by mouth daily.   levocetirizine 5 MG tablet Commonly known as: XYZAL Take 2.5 mg by mouth daily at 12 noon.   lidocaine 5 % Commonly known as: Lidoderm Place 1 patch onto the skin daily. Remove & Discard patch within 12 hours or as directed by MD   losartan 25 MG tablet Commonly known as: COZAAR Take 1 tablet (25 mg total) by mouth daily.   metoprolol succinate 25 MG 24 hr tablet Commonly known as: TOPROL-XL TAKE 1 TABLET (25 MG TOTAL) BY MOUTH DAILY.   nystatin ointment Commonly known as: MYCOSTATIN Apply 1 Application topically 2 (two) times daily as needed.   pantoprazole 40 MG tablet Commonly known as: PROTONIX TAKE 1 TABLET BY MOUTH EVERY DAY   tiZANidine 2 MG tablet Commonly known as: ZANAFLEX Take 0.5-1 tablets (1-2 mg total) by mouth every 6 (six) hours as needed for muscle spasms.   Vitamin D 50 MCG (2000 UT) Caps Take 2,000 Units by mouth daily at 6 (six) AM.        Allergies:  Allergies  Allergen Reactions   Amlodipine Shortness Of Breath   Darifenacin Hydrobromide Other (See Comments)  dizziness   Sulfonamide Derivatives Other (See Comments)    dizziness   Other Other (See Comments)    dizziness   Tramadol Other (See Comments)    Insomnia, anorexia    Past Medical History, Surgical history, Social history, and Family History were reviewed and updated.  Review of Systems: All other 10 point review of systems is negative.   Physical Exam:  weight is 82 lb 4 oz (37.3 kg). Her oral temperature is 97.6 F (36.4 C). Her blood pressure is 147/73 (abnormal) and her pulse is 97. Her respiration is 18 and oxygen saturation is 98%.   Wt Readings from Last 3 Encounters:  08/14/22 82 lb 4 oz (37.3 kg)  07/11/22 84 lb 12.8 oz (38.5 kg)  07/04/22 84 lb 12.8 oz (38.5 kg)     Ocular: Sclerae unicteric, pupils equal, round and reactive to light Ear-nose-throat: Oropharynx clear, dentition fair Lymphatic: No cervical or supraclavicular adenopathy Lungs no rales or rhonchi, good excursion bilaterally Heart regular rate and rhythm, no murmur appreciated Abd soft, nontender, positive bowel sounds MSK no focal spinal tenderness, no joint edema Neuro: non-focal, well-oriented, appropriate affect Breasts: Deferred   Lab Results  Component Value Date   WBC 7.9 08/14/2022   HGB 11.4 (L) 08/14/2022   HCT 36.9 08/14/2022   MCV 97.4 08/14/2022   PLT 220 08/14/2022   Lab Results  Component Value Date   FERRITIN 11 07/04/2022   IRON 39 07/04/2022   TIBC 330 07/04/2022   UIBC 291 07/04/2022   IRONPCTSAT 12 07/04/2022   Lab Results  Component Value Date   RETICCTPCT 1.6 08/14/2022   RBC 3.85 (L) 08/14/2022   No results found for: "KPAFRELGTCHN", "LAMBDASER", "KAPLAMBRATIO" No results found for: "IGGSERUM", "IGA", "IGMSERUM" No results found for: "TOTALPROTELP", "ALBUMINELP", "A1GS", "A2GS", "BETS", "BETA2SER", "GAMS", "MSPIKE", "SPEI"   Chemistry      Component Value Date/Time   NA 142 07/25/2022 1236   NA 139 04/11/2021 1312   K 3.9 07/25/2022 1236   CL 103 07/25/2022 1236   CO2 31 07/25/2022 1236   BUN 22 07/25/2022 1236   BUN 30 04/11/2021 1312   CREATININE 1.23 (H) 07/25/2022 1236   CREATININE 1.15 (H) 10/29/2019 1358      Component Value Date/Time   CALCIUM 9.2 07/25/2022 1236   ALKPHOS 14 (L) 07/25/2022 1236   AST 14 (L) 07/25/2022 1236   ALT 8 07/25/2022 1236   BILITOT 0.4 07/25/2022 1236       Impression and Plan: [name] [name] is a very pleasant 89 yo caucasian female with multifactorial anemia (IDA and erythropoietin deficiency).  ESA given today, Hgb 11.4.  Iron studies are pending.  Lab and injection every 3 weeks, follow-up in 6 weeks.    Eileen Stanford, NP 8/13/20241:09 PM

## 2022-08-20 ENCOUNTER — Ambulatory Visit: Payer: Medicare Other | Admitting: Family Medicine

## 2022-08-22 ENCOUNTER — Encounter: Payer: Self-pay | Admitting: Family Medicine

## 2022-08-22 ENCOUNTER — Ambulatory Visit (INDEPENDENT_AMBULATORY_CARE_PROVIDER_SITE_OTHER): Payer: Medicare Other | Admitting: Family Medicine

## 2022-08-22 VITALS — BP 141/50 | HR 73 | Ht 59.0 in | Wt 81.0 lb

## 2022-08-22 DIAGNOSIS — R739 Hyperglycemia, unspecified: Secondary | ICD-10-CM | POA: Diagnosis not present

## 2022-08-22 DIAGNOSIS — E778 Other disorders of glycoprotein metabolism: Secondary | ICD-10-CM

## 2022-08-22 DIAGNOSIS — I4819 Other persistent atrial fibrillation: Secondary | ICD-10-CM | POA: Diagnosis not present

## 2022-08-22 DIAGNOSIS — K219 Gastro-esophageal reflux disease without esophagitis: Secondary | ICD-10-CM

## 2022-08-22 DIAGNOSIS — N1831 Chronic kidney disease, stage 3a: Secondary | ICD-10-CM

## 2022-08-22 DIAGNOSIS — E039 Hypothyroidism, unspecified: Secondary | ICD-10-CM | POA: Diagnosis not present

## 2022-08-22 DIAGNOSIS — E559 Vitamin D deficiency, unspecified: Secondary | ICD-10-CM | POA: Diagnosis not present

## 2022-08-22 DIAGNOSIS — F411 Generalized anxiety disorder: Secondary | ICD-10-CM | POA: Diagnosis not present

## 2022-08-22 DIAGNOSIS — I10 Essential (primary) hypertension: Secondary | ICD-10-CM

## 2022-08-22 DIAGNOSIS — R21 Rash and other nonspecific skin eruption: Secondary | ICD-10-CM

## 2022-08-22 DIAGNOSIS — D649 Anemia, unspecified: Secondary | ICD-10-CM

## 2022-08-22 DIAGNOSIS — E538 Deficiency of other specified B group vitamins: Secondary | ICD-10-CM

## 2022-08-22 DIAGNOSIS — E782 Mixed hyperlipidemia: Secondary | ICD-10-CM

## 2022-08-22 LAB — COMPREHENSIVE METABOLIC PANEL
ALT: 7 U/L (ref 0–35)
AST: 13 U/L (ref 0–37)
Albumin: 3.2 g/dL — ABNORMAL LOW (ref 3.5–5.2)
Alkaline Phosphatase: 14 U/L — ABNORMAL LOW (ref 39–117)
BUN: 23 mg/dL (ref 6–23)
CO2: 33 meq/L — ABNORMAL HIGH (ref 19–32)
Calcium: 8.9 mg/dL (ref 8.4–10.5)
Chloride: 100 meq/L (ref 96–112)
Creatinine, Ser: 0.96 mg/dL (ref 0.40–1.20)
GFR: 49.9 mL/min — ABNORMAL LOW (ref >60.00)
Glucose, Bld: 97 mg/dL (ref 70–99)
Potassium: 4.5 mEq/L (ref 3.5–5.1)
Sodium: 140 meq/L (ref 135–145)
Total Bilirubin: 0.4 mg/dL (ref 0.2–1.2)
Total Protein: 6.7 g/dL (ref 6.0–8.3)

## 2022-08-22 MED ORDER — NYSTATIN 100000 UNIT/GM EX POWD: 1.0000 | Freq: Three times a day (TID) | CUTANEOUS | 0 refills | Status: DC

## 2022-08-22 MED ORDER — ALPRAZOLAM 0.25 MG PO TABS
0.1250 mg | ORAL_TABLET | Freq: Two times a day (BID) | ORAL | 0 refills | Status: DC | PRN
Start: 2022-08-22 — End: 2023-02-09

## 2022-08-22 NOTE — Assessment & Plan Note (Addendum)
Check TSH 

## 2022-08-22 NOTE — Assessment & Plan Note (Signed)
Continue supplementation. Labs today  

## 2022-08-22 NOTE — Assessment & Plan Note (Signed)
Labs today     Stay well hydrated

## 2022-08-22 NOTE — Assessment & Plan Note (Signed)
Stable A1c at last appointment Asymptomatic

## 2022-08-22 NOTE — Assessment & Plan Note (Signed)
Patient requesting refill of Xanax. Using very rarely.  PDMP reviewed. Safety discussed

## 2022-08-22 NOTE — Assessment & Plan Note (Signed)
Low at last check and supplement increased Repeat labs today

## 2022-08-22 NOTE — Assessment & Plan Note (Signed)
Missed her last biweekly injection Intrinsic factor negative last year Repeat labs today

## 2022-08-22 NOTE — Progress Notes (Addendum)
Established Patient Office Visit  Subjective   Patient ID: [name] [name], female    DOB: April 04, 1924  Age: 89 y.o. MRN: [id number]  Chief Complaint  Patient presents with   Medical Management of Chronic Issues    HPI  Discussed the use of AI scribe software for clinical note transcription with the patient, who gave verbal consent to proceed.   History of Present Illness   The patient presented for a regular follow-up visit. Recently discovered low B12 She had completed a course of weekly injections for four weeks, followed by bi-weekly injections x3 (she missed her appointment for the last injection). She is wondering if she has to continue injections and would like labs today.   She has been attempting to increase protein intake due to previously low albumin levels (drinking Boost regularly, but otherwise low appetite at baseline). She has been supplementing her diet with nutritional shakes.  Additionally, the patient has been experiencing persistent skin irritation/itching, particularly in the groin area. She has not noticed an associated rash. She also wears pads, primarily at night, due to occasional urinary incontinence, but will wear Depends during the day sometimes, especially if going out in public.  The patient also requested a refill of her Xanax prescription, which she uses infrequently for anxiety. The patient's blood pressure was initially elevated but normalized upon recheck. She confirmed that her blood pressure readings at home have been within normal limits.     Patient denies any chest pain, palpitations, dyspnea, wheezing, edema, recurrent headaches, vision changes.         ROS All review of systems negative except what is listed in the HPI    Objective:     BP (!) 141/50   Pulse 73   Ht 4\' 11"  (1.499 m)   Wt 81 lb (36.7 kg)   SpO2 100%   BMI 16.36 kg/m    Physical Exam Vitals reviewed.  Constitutional:      Appearance: Normal appearance.   Cardiovascular:     Rate and Rhythm: Normal rate and regular rhythm.     Heart sounds: Normal heart sounds.  Pulmonary:     Effort: Pulmonary effort is normal.     Breath sounds: Normal breath sounds.  Musculoskeletal:     Right lower leg: No edema.     Left lower leg: No edema.  Skin:    General: Skin is warm and dry.     Comments: Mild erythema to groin folds and suprapubic area, no excoriation, abrasions, or open wounds  Neurological:     Mental Status: She is alert and oriented to person, place, and time.  Psychiatric:        Mood and Affect: Mood normal.        Behavior: Behavior normal.        Thought Content: Thought content normal.        Judgment: Judgment normal.      No results found for any visits on 08/22/22.    The ASCVD Risk score (Arnett DK, et al., 2019) failed to calculate for the following reasons:   The 2019 ASCVD risk score is only valid for ages 64 to 90    Assessment & Plan:   Problem List Items Addressed This Visit     Hypothyroidism - Primary    Check TSH       Relevant Orders   TSH   Vitamin D deficiency    Continue supplementation  Labs today  Relevant Orders   VITAMIN D 25 Hydroxy (Vit-D Deficiency, Fractures)   Hyperlipidemia, mixed    Lifestyle factors for lowering cholesterol include: Diet therapy - heart-healthy diet rich in fruits, veggies, fiber-rich whole grains, lean meats, chicken, fish (at least twice a week), fat-free or 1% dairy products; foods low in saturated/trans fats, cholesterol, sodium, and sugar. Mediterranean diet has shown to be very heart healthy. Regular exercise - recommend at least 30 minutes a day, 5 times per week Weight management        Anemia    Following with hematology. No acute concerns.       Anxiety state    Patient requesting refill of Xanax. Using very rarely.  PDMP reviewed. Safety discussed       Relevant Medications   ALPRAZolam (XANAX) 0.25 MG tablet   Essential  hypertension (Chronic)    Blood pressure is at goal for age and co-morbidities.   Recommendations: continue current regimen - BP goal <130/80 - monitor and log blood pressures at home - check around the same time each day in a relaxed setting - Limit salt to <2000 mg/day - Follow DASH eating plan (heart healthy diet) - limit alcohol to 2 standard drinks per day for men and 1 per day for women - avoid tobacco products - get at least 2 hours of regular aerobic exercise weekly Patient aware of signs/symptoms requiring further/urgent evaluation. Labs updated today.       Persistent atrial fibrillation (HCC)    Controlled, no acute concerns Continue current regimen       GERD    No acute concerns Continue current regimen       Stage 3a chronic kidney disease (CKD) (HCC)    Labs today  Stay well hydrated      Hyperglycemia    Stable A1c at last appointment Asymptomatic       Vitamin B12 deficiency    Missed her last biweekly injection Intrinsic factor negative last year Repeat labs today      Relevant Orders   B12   Hypocalcemia    Low at last check and supplement increased Repeat labs today       Relevant Orders   Comprehensive metabolic panel   Hypoproteinemia (HCC)    Low at last labs She has been trying to increase protein intake (Boost shakes) Recheck today      Relevant Orders   Comprehensive metabolic panel   Other Visit Diagnoses     Rash     Area on your lower abdomen/groin is possibly irritation from the Depends. Recommend a barrier cream like Desitin. If the rash starts to look brighter red, you can try Nystatin powder if the ointment doesn't seem to be helping.    Relevant Medications   nystatin (MYCOSTATIN/NYSTOP) powder        Return in about 3 months (around 11/22/2022) for routine follow-up.    Clayborne Dana, NP

## 2022-08-22 NOTE — Assessment & Plan Note (Signed)
Controlled, no acute concerns Continue current regimen

## 2022-08-22 NOTE — Patient Instructions (Addendum)
Area on your lower abdomen/groin is possibly irritation from the Depends. Recommend a barrier cream like Desitin. If the rash starts to look brighter red, you can try Nystatin powder if the ointment doesn't seem to be helping.

## 2022-08-22 NOTE — Assessment & Plan Note (Signed)
Lifestyle factors for lowering cholesterol include: ?Diet therapy - heart-healthy diet rich in fruits, veggies, fiber-rich whole grains, lean meats, chicken, fish (at least twice a week), fat-free or 1% dairy products; foods low in saturated/trans fats, cholesterol, sodium, and sugar. Mediterranean diet has shown to be very heart healthy. ?Regular exercise - recommend at least 30 minutes a day, 5 times per week ?Weight management  ? ?

## 2022-08-22 NOTE — Assessment & Plan Note (Signed)
Following with hematology. No acute concerns.

## 2022-08-22 NOTE — Assessment & Plan Note (Signed)
Low at last labs She has been trying to increase protein intake (Boost shakes) Recheck today

## 2022-08-22 NOTE — Assessment & Plan Note (Signed)
No acute concerns Continue current regimen

## 2022-08-22 NOTE — Assessment & Plan Note (Signed)
Blood pressure is at goal for age and co-morbidities.   Recommendations: continue current regimen - BP goal <130/80 - monitor and log blood pressures at home - check around the same time each day in a relaxed setting - Limit salt to <2000 mg/day - Follow DASH eating plan (heart healthy diet) - limit alcohol to 2 standard drinks per day for men and 1 per day for women - avoid tobacco products - get at least 2 hours of regular aerobic exercise weekly Patient aware of signs/symptoms requiring further/urgent evaluation. Labs updated today.  

## 2022-08-24 ENCOUNTER — Other Ambulatory Visit (INDEPENDENT_AMBULATORY_CARE_PROVIDER_SITE_OTHER): Payer: Medicare Other

## 2022-08-24 DIAGNOSIS — E039 Hypothyroidism, unspecified: Secondary | ICD-10-CM | POA: Diagnosis not present

## 2022-08-24 LAB — VITAMIN B12: Vitamin B-12: 491 pg/mL (ref 211–911)

## 2022-08-24 LAB — VITAMIN D 25 HYDROXY (VIT D DEFICIENCY, FRACTURES): VITD: 45.01 ng/mL (ref 30.00–100.00)

## 2022-08-24 LAB — TSH: TSH: 8.07 u[IU]/mL — ABNORMAL HIGH (ref 0.35–5.50)

## 2022-08-24 NOTE — Progress Notes (Signed)
Lab added (free t4)

## 2022-08-24 NOTE — Addendum Note (Signed)
Addended by: Clayborne Dana on: 08/24/2022 01:38 PM   Modules accepted: Orders

## 2022-08-27 LAB — T4, FREE: Free T4: 0.92 ng/dL (ref 0.60–1.60)

## 2022-08-28 ENCOUNTER — Other Ambulatory Visit: Payer: Self-pay | Admitting: *Deleted

## 2022-08-28 MED ORDER — LOSARTAN POTASSIUM 25 MG PO TABS: 25.0000 mg | ORAL_TABLET | Freq: Every day | ORAL | 3 refills | Status: DC

## 2022-09-04 ENCOUNTER — Inpatient Hospital Stay: Payer: Medicare Other

## 2022-09-04 ENCOUNTER — Inpatient Hospital Stay: Payer: Medicare Other | Attending: Hematology & Oncology

## 2022-09-04 VITALS — BP 142/68 | HR 63 | Temp 97.8°F | Resp 20

## 2022-09-04 DIAGNOSIS — Z7901 Long term (current) use of anticoagulants: Secondary | ICD-10-CM

## 2022-09-04 DIAGNOSIS — D500 Iron deficiency anemia secondary to blood loss (chronic): Secondary | ICD-10-CM

## 2022-09-04 DIAGNOSIS — N1831 Chronic kidney disease, stage 3a: Secondary | ICD-10-CM | POA: Insufficient documentation

## 2022-09-04 DIAGNOSIS — D631 Anemia in chronic kidney disease: Secondary | ICD-10-CM | POA: Diagnosis not present

## 2022-09-04 LAB — CBC WITH DIFFERENTIAL (CANCER CENTER ONLY)
Abs Immature Granulocytes: 0.01 10*3/uL (ref 0.00–0.07)
Basophils Absolute: 0 10*3/uL (ref 0.0–0.1)
Basophils Relative: 1 %
Eosinophils Absolute: 0 10*3/uL (ref 0.0–0.5)
Eosinophils Relative: 0 %
HCT: 33.1 % — ABNORMAL LOW (ref 36.0–46.0)
Hemoglobin: 10.3 g/dL — ABNORMAL LOW (ref 12.0–15.0)
Immature Granulocytes: 0 %
Lymphocytes Relative: 21 %
Lymphs Abs: 1.3 10*3/uL (ref 0.7–4.0)
MCH: 29.9 pg (ref 26.0–34.0)
MCHC: 31.1 g/dL (ref 30.0–36.0)
MCV: 95.9 fL (ref 80.0–100.0)
Monocytes Absolute: 0.3 10*3/uL (ref 0.1–1.0)
Monocytes Relative: 5 %
Neutro Abs: 4.6 10*3/uL (ref 1.7–7.7)
Neutrophils Relative %: 73 %
Platelet Count: 222 10*3/uL (ref 150–400)
RBC: 3.45 MIL/uL — ABNORMAL LOW (ref 3.87–5.11)
RDW: 16.5 % — ABNORMAL HIGH (ref 11.5–15.5)
WBC Count: 6.3 10*3/uL (ref 4.0–10.5)
nRBC: 0 % (ref 0.0–0.2)

## 2022-09-04 LAB — CMP (CANCER CENTER ONLY)
ALT: 7 U/L (ref 0–44)
AST: 13 U/L — ABNORMAL LOW (ref 15–41)
Albumin: 3.5 g/dL (ref 3.5–5.0)
Alkaline Phosphatase: 12 U/L — ABNORMAL LOW (ref 38–126)
Anion gap: 7 (ref 5–15)
BUN: 31 mg/dL — ABNORMAL HIGH (ref 8–23)
CO2: 30 mmol/L (ref 22–32)
Calcium: 9.2 mg/dL (ref 8.9–10.3)
Chloride: 103 mmol/L (ref 98–111)
Creatinine: 1.19 mg/dL — ABNORMAL HIGH (ref 0.44–1.00)
GFR, Estimated: 42 mL/min — ABNORMAL LOW (ref >60)
Glucose, Bld: 87 mg/dL (ref 70–99)
Potassium: 4.3 mmol/L (ref 3.5–5.1)
Sodium: 140 mmol/L (ref 135–145)
Total Bilirubin: 0.3 mg/dL (ref 0.3–1.2)
Total Protein: 7.1 g/dL (ref 6.5–8.1)

## 2022-09-04 MED ORDER — DARBEPOETIN ALFA 300 MCG/0.6ML IJ SOSY
300.0000 ug | PREFILLED_SYRINGE | Freq: Once | INTRAMUSCULAR | Status: AC
  Administered 2022-09-04: 300 ug via SUBCUTANEOUS
  Filled 2022-09-04: qty 0.6

## 2022-09-04 NOTE — Patient Instructions (Signed)

## 2022-09-16 ENCOUNTER — Other Ambulatory Visit: Payer: Self-pay | Admitting: Family Medicine

## 2022-09-19 DIAGNOSIS — D0471 Carcinoma in situ of skin of right lower limb, including hip: Secondary | ICD-10-CM | POA: Diagnosis not present

## 2022-09-19 DIAGNOSIS — Z08 Encounter for follow-up examination after completed treatment for malignant neoplasm: Secondary | ICD-10-CM | POA: Diagnosis not present

## 2022-09-19 DIAGNOSIS — L298 Other pruritus: Secondary | ICD-10-CM | POA: Diagnosis not present

## 2022-09-19 DIAGNOSIS — Z85828 Personal history of other malignant neoplasm of skin: Secondary | ICD-10-CM | POA: Diagnosis not present

## 2022-09-25 ENCOUNTER — Inpatient Hospital Stay: Payer: Medicare Other

## 2022-09-25 ENCOUNTER — Encounter: Payer: Self-pay | Admitting: Medical Oncology

## 2022-09-25 ENCOUNTER — Inpatient Hospital Stay: Payer: Medicare Other | Admitting: Medical Oncology

## 2022-09-25 ENCOUNTER — Other Ambulatory Visit: Payer: Self-pay

## 2022-09-25 VITALS — BP 126/70 | HR 69 | Temp 97.6°F | Resp 18 | Ht 59.0 in | Wt 82.0 lb

## 2022-09-25 DIAGNOSIS — D509 Iron deficiency anemia, unspecified: Secondary | ICD-10-CM | POA: Diagnosis not present

## 2022-09-25 DIAGNOSIS — D500 Iron deficiency anemia secondary to blood loss (chronic): Secondary | ICD-10-CM

## 2022-09-25 DIAGNOSIS — Z7901 Long term (current) use of anticoagulants: Secondary | ICD-10-CM

## 2022-09-25 DIAGNOSIS — N1831 Chronic kidney disease, stage 3a: Secondary | ICD-10-CM | POA: Diagnosis not present

## 2022-09-25 DIAGNOSIS — D631 Anemia in chronic kidney disease: Secondary | ICD-10-CM

## 2022-09-25 LAB — CBC WITH DIFFERENTIAL (CANCER CENTER ONLY)
Abs Immature Granulocytes: 0.05 10*3/uL (ref 0.00–0.07)
Basophils Absolute: 0 10*3/uL (ref 0.0–0.1)
Basophils Relative: 1 %
Eosinophils Absolute: 0 10*3/uL (ref 0.0–0.5)
Eosinophils Relative: 1 %
HCT: 34.5 % — ABNORMAL LOW (ref 36.0–46.0)
Hemoglobin: 10.6 g/dL — ABNORMAL LOW (ref 12.0–15.0)
Immature Granulocytes: 1 %
Lymphocytes Relative: 19 %
Lymphs Abs: 1.2 10*3/uL (ref 0.7–4.0)
MCH: 29.3 pg (ref 26.0–34.0)
MCHC: 30.7 g/dL (ref 30.0–36.0)
MCV: 95.3 fL (ref 80.0–100.0)
Monocytes Absolute: 0.4 10*3/uL (ref 0.1–1.0)
Monocytes Relative: 6 %
Neutro Abs: 4.5 10*3/uL (ref 1.7–7.7)
Neutrophils Relative %: 72 %
Platelet Count: 231 10*3/uL (ref 150–400)
RBC: 3.62 MIL/uL — ABNORMAL LOW (ref 3.87–5.11)
RDW: 16.4 % — ABNORMAL HIGH (ref 11.5–15.5)
WBC Count: 6.2 10*3/uL (ref 4.0–10.5)
nRBC: 0 % (ref 0.0–0.2)

## 2022-09-25 LAB — CMP (CANCER CENTER ONLY)
ALT: 8 U/L (ref 0–44)
AST: 14 U/L — ABNORMAL LOW (ref 15–41)
Albumin: 3.6 g/dL (ref 3.5–5.0)
Alkaline Phosphatase: 12 U/L — ABNORMAL LOW (ref 38–126)
Anion gap: 8 (ref 5–15)
BUN: 31 mg/dL — ABNORMAL HIGH (ref 8–23)
CO2: 31 mmol/L (ref 22–32)
Calcium: 9.1 mg/dL (ref 8.9–10.3)
Chloride: 101 mmol/L (ref 98–111)
Creatinine: 1.24 mg/dL — ABNORMAL HIGH (ref 0.44–1.00)
GFR, Estimated: 40 mL/min — ABNORMAL LOW (ref >60)
Glucose, Bld: 94 mg/dL (ref 70–99)
Potassium: 4.7 mmol/L (ref 3.5–5.1)
Sodium: 140 mmol/L (ref 135–145)
Total Bilirubin: 0.4 mg/dL (ref 0.3–1.2)
Total Protein: 7.1 g/dL (ref 6.5–8.1)

## 2022-09-25 LAB — RETICULOCYTES
Immature Retic Fract: 11.4 % (ref 2.3–15.9)
RBC.: 3.55 MIL/uL — ABNORMAL LOW (ref 3.87–5.11)
Retic Count, Absolute: 54.3 10*3/uL (ref 19.0–186.0)
Retic Ct Pct: 1.5 % (ref 0.4–3.1)

## 2022-09-25 LAB — FERRITIN: Ferritin: 27 ng/mL (ref 11–307)

## 2022-09-25 MED ORDER — DARBEPOETIN ALFA 300 MCG/0.6ML IJ SOSY
300.0000 ug | PREFILLED_SYRINGE | Freq: Once | INTRAMUSCULAR | Status: DC
  Filled 2022-09-25: qty 0.6

## 2022-09-25 MED ORDER — DARBEPOETIN ALFA 300 MCG/0.6ML IJ SOSY
300.0000 ug | PREFILLED_SYRINGE | Freq: Once | INTRAMUSCULAR | Status: AC
  Administered 2022-09-25: 300 ug via SUBCUTANEOUS

## 2022-09-25 NOTE — Progress Notes (Signed)
Hematology and Oncology Follow Up Visit  [name] [name] [id number] 05/07/1924 89 y.o. 09/25/2022   Principle Diagnosis:  Iron deficiency anemia  Erythropoietin deficiency anemia    Current Therapy:        IV iron as indicated  Aranesp 300 mcg SQ to maintain Hgb > 11   Interim History:  [name] [name] is here today with her daughter for follow-up.   They report that she is doing really well and they have no new complaints at this time.  She has fatigue at times and will rest when needed.  No blood loss noted. No bruising or petechiae.  No fever, chills, n/v, cough, rash, dizziness, SOB, chest pain or changes in bowel or bladder habits.  Intermittent abdominal pain with rectocele unchanged from baseline.  No swelling, tenderness in her extremities.  No falls or syncope.  Appetite and hydration are good right now.  Wt Readings from Last 3 Encounters:  09/25/22 82 lb (37.2 kg)  08/22/22 81 lb (36.7 kg)  08/14/22 82 lb 4 oz (37.3 kg)     ECOG Performance Status: 1 - Symptomatic but completely ambulatory  Medications:  Allergies as of 09/25/2022       Reactions   Amlodipine Shortness Of Breath   Darifenacin Hydrobromide Other (See Comments)   dizziness   Sulfonamide Derivatives Other (See Comments)   dizziness   Other Other (See Comments)   dizziness   Tramadol Other (See Comments)   Insomnia, anorexia        Medication List        Accurate as of September 25, 2022  2:03 PM. If you have any questions, ask your nurse or doctor.          STOP taking these medications    tiZANidine 2 MG tablet Commonly known as: ZANAFLEX Stopped by: Rushie Chestnut       TAKE these medications    ALPRAZolam 0.25 MG tablet Commonly known as: XANAX Take 0.5-1 tablets (0.125-0.25 mg total) by mouth 2 (two) times daily as needed for anxiety.   diclofenac Sodium 1 % Gel Commonly known as: Voltaren Apply 2 g topically 4 (four) times daily.   famotidine 20 MG  tablet Commonly known as: PEPCID Take 20 mg by mouth at bedtime.   furosemide 20 MG tablet Commonly known as: LASIX PLEASE SEE ATTACHED FOR DETAILED DIRECTIONS   Hemocyte-F 324-1 MG Tabs Generic drug: Ferrous Fumarate-Folic Acid Take 1 tablet by mouth daily.   isosorbide mononitrate 30 MG 24 hr tablet Commonly known as: IMDUR Take 1 tablet (30 mg total) by mouth daily.   levocetirizine 5 MG tablet Commonly known as: XYZAL Take 2.5 mg by mouth daily at 12 noon.   losartan 25 MG tablet Commonly known as: COZAAR Take 1 tablet (25 mg total) by mouth daily.   metoprolol succinate 25 MG 24 hr tablet Commonly known as: TOPROL-XL TAKE 1 TABLET (25 MG TOTAL) BY MOUTH DAILY.   nystatin powder Commonly known as: MYCOSTATIN/NYSTOP Apply 1 Application topically 3 (three) times daily. What changed: Another medication with the same name was removed. Continue taking this medication, and follow the directions you see here. Changed by: Rushie Chestnut   pantoprazole 40 MG tablet Commonly known as: PROTONIX TAKE 1 TABLET BY MOUTH EVERY DAY   Vitamin D 50 MCG (2000 UT) Caps Take 2,000 Units by mouth daily at 6 (six) AM.        Allergies:  Allergies  Allergen Reactions   Amlodipine Shortness  Of Breath   Darifenacin Hydrobromide Other (See Comments)    dizziness   Sulfonamide Derivatives Other (See Comments)    dizziness   Other Other (See Comments)    dizziness   Tramadol Other (See Comments)    Insomnia, anorexia    Past Medical History, Surgical history, Social history, and Family History were reviewed and updated.  Review of Systems: All other 10 point review of systems is negative.   Physical Exam:  height is 4\' 11"  (1.499 m) and weight is 82 lb (37.2 kg). Her oral temperature is 97.6 F (36.4 C). Her blood pressure is 126/70 and her pulse is 69. Her respiration is 18 and oxygen saturation is 97%.   Wt Readings from Last 3 Encounters:  09/25/22 82 lb (37.2 kg)   08/22/22 81 lb (36.7 kg)  08/14/22 82 lb 4 oz (37.3 kg)    Ocular: Sclerae unicteric, pupils equal, round and reactive to light Ear-nose-throat: Oropharynx clear, dentition fair Lymphatic: No cervical or supraclavicular adenopathy Lungs no rales or rhonchi, good excursion bilaterally Heart regular rate and rhythm, no murmur appreciated Abd soft, nontender, positive bowel sounds MSK no focal spinal tenderness, no joint edema Neuro: non-focal, well-oriented, appropriate affect Breasts: Deferred   Lab Results  Component Value Date   WBC 6.2 09/25/2022   HGB 10.6 (L) 09/25/2022   HCT 34.5 (L) 09/25/2022   MCV 95.3 09/25/2022   PLT 231 09/25/2022   Lab Results  Component Value Date   FERRITIN 96 08/14/2022   IRON 63 08/14/2022   TIBC 298 08/14/2022   UIBC 235 08/14/2022   IRONPCTSAT 21 08/14/2022   Lab Results  Component Value Date   RETICCTPCT 1.5 09/25/2022   RBC 3.62 (L) 09/25/2022   RBC 3.55 (L) 09/25/2022   No results found for: "KPAFRELGTCHN", "LAMBDASER", "KAPLAMBRATIO" No results found for: "IGGSERUM", "IGA", "IGMSERUM" No results found for: "TOTALPROTELP", "ALBUMINELP", "A1GS", "A2GS", "BETS", "BETA2SER", "GAMS", "MSPIKE", "SPEI"   Chemistry      Component Value Date/Time   NA 140 09/25/2022 1323   NA 139 04/11/2021 1312   K 4.7 09/25/2022 1323   CL 101 09/25/2022 1323   CO2 31 09/25/2022 1323   BUN 31 (H) 09/25/2022 1323   BUN 30 04/11/2021 1312   CREATININE 1.24 (H) 09/25/2022 1323   CREATININE 1.15 (H) 10/29/2019 1358      Component Value Date/Time   CALCIUM 9.1 09/25/2022 1323   ALKPHOS 12 (L) 09/25/2022 1323   AST 14 (L) 09/25/2022 1323   ALT 8 09/25/2022 1323   BILITOT 0.4 09/25/2022 1323     Impression and Plan: [name] is a very pleasant 89 yo caucasian female with multifactorial anemia (IDA and erythropoietin deficiency).   ESA given today, Hgb 10.6 Iron studies are pending.   RTC 3 weeks labs and injection RTC 6 weeks APP, labs,  Injection-[place]   Rushie Chestnut, [place] 9/24/20242:03 PM

## 2022-09-25 NOTE — Patient Instructions (Signed)

## 2022-09-26 LAB — IRON AND IRON BINDING CAPACITY (CC-WL,HP ONLY)
Iron: 64 ug/dL (ref 28–170)
Saturation Ratios: 16 % (ref 10.4–31.8)
TIBC: 393 ug/dL (ref 250–450)
UIBC: 329 ug/dL (ref 148–442)

## 2022-09-28 ENCOUNTER — Encounter: Payer: Self-pay | Admitting: Hematology & Oncology

## 2022-10-05 ENCOUNTER — Other Ambulatory Visit: Payer: Self-pay | Admitting: Medical Oncology

## 2022-10-11 ENCOUNTER — Inpatient Hospital Stay: Payer: Medicare Other | Attending: Hematology & Oncology

## 2022-10-11 VITALS — BP 116/46 | HR 73 | Resp 17

## 2022-10-11 DIAGNOSIS — N1831 Chronic kidney disease, stage 3a: Secondary | ICD-10-CM | POA: Diagnosis not present

## 2022-10-11 DIAGNOSIS — Z7901 Long term (current) use of anticoagulants: Secondary | ICD-10-CM

## 2022-10-11 DIAGNOSIS — D631 Anemia in chronic kidney disease: Secondary | ICD-10-CM | POA: Diagnosis not present

## 2022-10-11 MED ORDER — SODIUM CHLORIDE 0.9 % IV SOLN: INTRAVENOUS | Status: DC

## 2022-10-11 MED ORDER — SODIUM CHLORIDE 0.9 % IV SOLN
200.0000 mg | Freq: Once | INTRAVENOUS | Status: AC
  Administered 2022-10-11: 200 mg via INTRAVENOUS
  Filled 2022-10-11: qty 200

## 2022-10-11 NOTE — Patient Instructions (Signed)
Iron Sucrose Injection What is this medication? IRON SUCROSE (EYE ern SOO krose) treats low levels of iron (iron deficiency anemia) in people with kidney disease. Iron is a mineral that plays an important role in making red blood cells, which carry oxygen from your lungs to the rest of your body. This medicine may be used for other purposes; ask your health care provider or pharmacist if you have questions. COMMON BRAND NAME(S): Venofer What should I tell my care team before I take this medication? They need to know if you have any of these conditions: Anemia not caused by low iron levels Heart disease High levels of iron in the blood Kidney disease Liver disease An unusual or allergic reaction to iron, other medications, foods, dyes, or preservatives Pregnant or trying to get pregnant Breastfeeding How should I use this medication? This medication is for infusion into a vein. It is given in a hospital or clinic setting. Talk to your care team about the use of this medication in children. While this medication may be prescribed for children as young as 2 years for selected conditions, precautions do apply. Overdosage: If you think you have taken too much of this medicine contact a poison control center or emergency room at once. NOTE: This medicine is only for you. Do not share this medicine with others. What if I miss a dose? Keep appointments for follow-up doses. It is important not to miss your dose. Call your care team if you are unable to keep an appointment. What may interact with this medication? Do not take this medication with any of the following: Deferoxamine Dimercaprol Other iron products This medication may also interact with the following: Chloramphenicol Deferasirox This list may not describe all possible interactions. Give your health care provider a list of all the medicines, herbs, non-prescription drugs, or dietary supplements you use. Also tell them if you smoke,  drink alcohol, or use illegal drugs. Some items may interact with your medicine. What should I watch for while using this medication? Visit your care team regularly. Tell your care team if your symptoms do not start to get better or if they get worse. You may need blood work done while you are taking this medication. You may need to follow a special diet. Talk to your care team. Foods that contain iron include: whole grains/cereals, dried fruits, beans, or peas, leafy green vegetables, and organ meats (liver, kidney). What side effects may I notice from receiving this medication? Side effects that you should report to your care team as soon as possible: Allergic reactions--skin rash, itching, hives, swelling of the face, lips, tongue, or throat Low blood pressure--dizziness, feeling faint or lightheaded, blurry vision Shortness of breath Side effects that usually do not require medical attention (report to your care team if they continue or are bothersome): Flushing Headache Joint pain Muscle pain Nausea Pain, redness, or irritation at injection site This list may not describe all possible side effects. Call your doctor for medical advice about side effects. You may report side effects to FDA at 1-800-FDA-1088. Where should I keep my medication? This medication is given in a hospital or clinic. It will not be stored at home. NOTE: This sheet is a summary. It may not cover all possible information. If you have questions about this medicine, talk to your doctor, pharmacist, or health care provider.  2024 Elsevier/Gold Standard (2022-05-25 00:00:00)

## 2022-10-15 DIAGNOSIS — L570 Actinic keratosis: Secondary | ICD-10-CM | POA: Diagnosis not present

## 2022-10-15 DIAGNOSIS — L2989 Other pruritus: Secondary | ICD-10-CM | POA: Diagnosis not present

## 2022-10-15 DIAGNOSIS — L308 Other specified dermatitis: Secondary | ICD-10-CM | POA: Diagnosis not present

## 2022-10-25 ENCOUNTER — Other Ambulatory Visit: Payer: Self-pay | Admitting: Family Medicine

## 2022-11-05 DIAGNOSIS — D0471 Carcinoma in situ of skin of right lower limb, including hip: Secondary | ICD-10-CM | POA: Diagnosis not present

## 2022-11-05 DIAGNOSIS — L2989 Other pruritus: Secondary | ICD-10-CM | POA: Diagnosis not present

## 2022-11-05 DIAGNOSIS — L308 Other specified dermatitis: Secondary | ICD-10-CM | POA: Diagnosis not present

## 2022-11-06 ENCOUNTER — Inpatient Hospital Stay (HOSPITAL_BASED_OUTPATIENT_CLINIC_OR_DEPARTMENT_OTHER): Payer: Medicare Other | Admitting: Medical Oncology

## 2022-11-06 ENCOUNTER — Inpatient Hospital Stay: Payer: Medicare Other

## 2022-11-06 ENCOUNTER — Inpatient Hospital Stay: Payer: Medicare Other | Attending: Hematology & Oncology

## 2022-11-06 ENCOUNTER — Encounter: Payer: Self-pay | Admitting: Medical Oncology

## 2022-11-06 ENCOUNTER — Other Ambulatory Visit: Payer: Self-pay

## 2022-11-06 DIAGNOSIS — D631 Anemia in chronic kidney disease: Secondary | ICD-10-CM | POA: Diagnosis not present

## 2022-11-06 DIAGNOSIS — N1831 Chronic kidney disease, stage 3a: Secondary | ICD-10-CM | POA: Diagnosis not present

## 2022-11-06 DIAGNOSIS — Z7901 Long term (current) use of anticoagulants: Secondary | ICD-10-CM | POA: Diagnosis not present

## 2022-11-06 DIAGNOSIS — E611 Iron deficiency: Secondary | ICD-10-CM | POA: Diagnosis not present

## 2022-11-06 DIAGNOSIS — D500 Iron deficiency anemia secondary to blood loss (chronic): Secondary | ICD-10-CM

## 2022-11-06 LAB — CBC WITH DIFFERENTIAL (CANCER CENTER ONLY)
Abs Immature Granulocytes: 0.02 10*3/uL (ref 0.00–0.07)
Basophils Absolute: 0 10*3/uL (ref 0.0–0.1)
Basophils Relative: 1 %
Eosinophils Absolute: 0 10*3/uL (ref 0.0–0.5)
Eosinophils Relative: 0 %
HCT: 30.4 % — ABNORMAL LOW (ref 36.0–46.0)
Hemoglobin: 9.6 g/dL — ABNORMAL LOW (ref 12.0–15.0)
Immature Granulocytes: 0 %
Lymphocytes Relative: 21 %
Lymphs Abs: 1.5 10*3/uL (ref 0.7–4.0)
MCH: 29.8 pg (ref 26.0–34.0)
MCHC: 31.6 g/dL (ref 30.0–36.0)
MCV: 94.4 fL (ref 80.0–100.0)
Monocytes Absolute: 0.3 10*3/uL (ref 0.1–1.0)
Monocytes Relative: 5 %
Neutro Abs: 5.2 10*3/uL (ref 1.7–7.7)
Neutrophils Relative %: 73 %
Platelet Count: 252 10*3/uL (ref 150–400)
RBC: 3.22 MIL/uL — ABNORMAL LOW (ref 3.87–5.11)
RDW: 16.6 % — ABNORMAL HIGH (ref 11.5–15.5)
WBC Count: 7 10*3/uL (ref 4.0–10.5)
nRBC: 0 % (ref 0.0–0.2)

## 2022-11-06 LAB — RETICULOCYTES
Immature Retic Fract: 9.9 % (ref 2.3–15.9)
RBC.: 3.25 MIL/uL — ABNORMAL LOW (ref 3.87–5.11)
Retic Count, Absolute: 46.8 10*3/uL (ref 19.0–186.0)
Retic Ct Pct: 1.4 % (ref 0.4–3.1)

## 2022-11-06 LAB — CMP (CANCER CENTER ONLY)
ALT: 7 U/L (ref 0–44)
AST: 13 U/L — ABNORMAL LOW (ref 15–41)
Albumin: 3.7 g/dL (ref 3.5–5.0)
Alkaline Phosphatase: 15 U/L — ABNORMAL LOW (ref 38–126)
Anion gap: 8 (ref 5–15)
BUN: 40 mg/dL — ABNORMAL HIGH (ref 8–23)
CO2: 30 mmol/L (ref 22–32)
Calcium: 9.3 mg/dL (ref 8.9–10.3)
Chloride: 104 mmol/L (ref 98–111)
Creatinine: 1.16 mg/dL — ABNORMAL HIGH (ref 0.44–1.00)
GFR, Estimated: 43 mL/min — ABNORMAL LOW (ref >60)
Glucose, Bld: 118 mg/dL — ABNORMAL HIGH (ref 70–99)
Potassium: 5.2 mmol/L — ABNORMAL HIGH (ref 3.5–5.1)
Sodium: 142 mmol/L (ref 135–145)
Total Bilirubin: 0.3 mg/dL (ref <1.2)
Total Protein: 7.3 g/dL (ref 6.5–8.1)

## 2022-11-06 LAB — FERRITIN: Ferritin: 68 ng/mL (ref 11–307)

## 2022-11-06 MED ORDER — DARBEPOETIN ALFA 300 MCG/0.6ML IJ SOSY
300.0000 ug | PREFILLED_SYRINGE | Freq: Once | INTRAMUSCULAR | Status: AC
Start: 2022-11-06 — End: 2022-11-06
  Administered 2022-11-06: 300 ug via SUBCUTANEOUS
  Filled 2022-11-06: qty 0.6

## 2022-11-06 NOTE — Patient Instructions (Addendum)

## 2022-11-06 NOTE — Progress Notes (Signed)
Hematology and Oncology Follow Up Visit  JACQELINE BROERS [id number] [birth year] 89 y.o. 11/06/2022   Principle Diagnosis:  Iron deficiency anemia  Erythropoietin deficiency anemia    Current Therapy:        IV iron as indicated  Aranesp 300 mcg SQ to maintain Hgb > 11   Interim History:  Ms. Haralson is here today with her daughter for follow-up.   Today they report that she is ok overall.  She has fatigue at times and will rest when needed.  No blood loss noted. No bruising or petechiae.  No fever, chills, n/v, cough, rash, dizziness, SOB, chest pain or changes in bowel or bladder habits.  Intermittent abdominal pain with rectocele unchanged from baseline.  No swelling, tenderness in her extremities.  No falls or syncope.  Appetite and hydration are down slightly. She does drink one boost a day along with food though her overall appetite is down  Wt Readings from Last 3 Encounters:  11/06/22 80 lb (36.3 kg)  09/25/22 82 lb (37.2 kg)  08/22/22 81 lb (36.7 kg)     ECOG Performance Status: 1 - Symptomatic but completely ambulatory  Medications:  Allergies as of 11/06/2022       Reactions   Amlodipine Shortness Of Breath   Darifenacin Hydrobromide Other (See Comments)   dizziness   Sulfonamide Derivatives Other (See Comments)   dizziness   Other Other (See Comments)   dizziness   Tramadol Other (See Comments)   Insomnia, anorexia        Medication List        Accurate as of November 06, 2022  3:02 PM. If you have any questions, ask your nurse or doctor.          ALPRAZolam 0.25 MG tablet Commonly known as: XANAX Take 0.5-1 tablets (0.125-0.25 mg total) by mouth 2 (two) times daily as needed for anxiety.   Derma-Smoothe/FS Body 0.01 % Oil Generic drug: Fluocinolone Acetonide Body Apply topically.   diclofenac Sodium 1 % Gel Commonly known as: Voltaren Apply 2 g topically 4 (four) times daily.   famotidine 20 MG tablet Commonly known as: PEPCID Take 20  mg by mouth at bedtime.   furosemide 20 MG tablet Commonly known as: LASIX PLEASE SEE ATTACHED FOR DETAILED DIRECTIONS   Hemocyte-F 324-1 MG Tabs Generic drug: Ferrous Fumarate-Folic Acid Take 1 tablet by mouth daily.   hydrOXYzine 10 MG tablet Commonly known as: ATARAX Take 10 mg by mouth at bedtime as needed for itching.   isosorbide mononitrate 30 MG 24 hr tablet Commonly known as: IMDUR Take 1 tablet (30 mg total) by mouth daily.   levocetirizine 5 MG tablet Commonly known as: XYZAL Take 2.5 mg by mouth daily at 12 noon.   losartan 25 MG tablet Commonly known as: COZAAR Take 1 tablet (25 mg total) by mouth daily.   metoprolol succinate 25 MG 24 hr tablet Commonly known as: TOPROL-XL Take 1 tablet (25 mg total) by mouth daily.   nystatin powder Commonly known as: MYCOSTATIN/NYSTOP Apply 1 Application topically 3 (three) times daily.   pantoprazole 40 MG tablet Commonly known as: PROTONIX TAKE 1 TABLET BY MOUTH EVERY DAY   [place] 4 % Crea Generic drug: Fluorouracil Apply topically daily.   Vitamin D 50 MCG (2000 UT) Caps Take 2,000 Units by mouth daily at 6 (six) AM.        Allergies:  Allergies  Allergen Reactions   Amlodipine Shortness Of Breath   Darifenacin Hydrobromide Other (  See Comments)    dizziness   Sulfonamide Derivatives Other (See Comments)    dizziness   Other Other (See Comments)    dizziness   Tramadol Other (See Comments)    Insomnia, anorexia    Past Medical History, Surgical history, Social history, and Family History were reviewed and updated.  Review of Systems: All other 10 point review of systems is negative.   Physical Exam:  weight is 80 lb (36.3 kg). Her oral temperature is 98 F (36.7 C). Her blood pressure is 127/55 (abnormal) and her pulse is 66. Her respiration is 17 and oxygen saturation is 99%.   Wt Readings from Last 3 Encounters:  11/06/22 80 lb (36.3 kg)  09/25/22 82 lb (37.2 kg)  08/22/22 81 lb (36.7 kg)     Ocular: Sclerae unicteric, pupils equal, round and reactive to light Ear-nose-throat: Oropharynx clear, dentition fair Lymphatic: No cervical or supraclavicular adenopathy Lungs no rales or rhonchi, good excursion bilaterally Heart regular rate and rhythm, no murmur appreciated Abd soft, nontender, positive bowel sounds MSK no focal spinal tenderness, no joint edema Neuro: non-focal, well-oriented, appropriate affect   Lab Results  Component Value Date   WBC 7.0 11/06/2022   HGB 9.6 (L) 11/06/2022   HCT 30.4 (L) 11/06/2022   MCV 94.4 11/06/2022   PLT 252 11/06/2022   Lab Results  Component Value Date   FERRITIN 27 09/25/2022   IRON 64 09/25/2022   TIBC 393 09/25/2022   UIBC 329 09/25/2022   IRONPCTSAT 16 09/25/2022   Lab Results  Component Value Date   RETICCTPCT 1.5 09/25/2022   RBC 3.22 (L) 11/06/2022   No results found for: "KPAFRELGTCHN", "LAMBDASER", "KAPLAMBRATIO" No results found for: "IGGSERUM", "IGA", "IGMSERUM" No results found for: "TOTALPROTELP", "ALBUMINELP", "A1GS", "A2GS", "BETS", "BETA2SER", "GAMS", "MSPIKE", "SPEI"   Chemistry      Component Value Date/Time   NA 140 09/25/2022 1323   NA 139 04/11/2021 1312   K 4.7 09/25/2022 1323   CL 101 09/25/2022 1323   CO2 31 09/25/2022 1323   BUN 31 (H) 09/25/2022 1323   BUN 30 04/11/2021 1312   CREATININE 1.24 (H) 09/25/2022 1323   CREATININE 1.15 (H) 10/29/2019 1358      Component Value Date/Time   CALCIUM 9.1 09/25/2022 1323   ALKPHOS 12 (L) 09/25/2022 1323   AST 14 (L) 09/25/2022 1323   ALT 8 09/25/2022 1323   BILITOT 0.4 09/25/2022 1323     Impression and Plan: Ms. Osorno is a very pleasant 89 yo caucasian female with multifactorial anemia (IDA and erythropoietin deficiency).   Hgb today is 9.6, ESA today Iron studies are pending.  She will try to increase her boost intake to 2 per day to help with any nutritional deficiencies that may be contributing   RTC 3 weeks labs and injection RTC  6 weeks APP, labs, Injection-[place]   Rushie Chestnut, PA-C 11/5/20243:02 PM

## 2022-11-07 LAB — IRON AND IRON BINDING CAPACITY (CC-WL,HP ONLY)
Iron: 38 ug/dL (ref 28–170)
Saturation Ratios: 13 % (ref 10.4–31.8)
TIBC: 295 ug/dL (ref 250–450)
UIBC: 257 ug/dL (ref 148–442)

## 2022-11-10 ENCOUNTER — Encounter (HOSPITAL_BASED_OUTPATIENT_CLINIC_OR_DEPARTMENT_OTHER): Payer: Self-pay | Admitting: Emergency Medicine

## 2022-11-10 ENCOUNTER — Emergency Department (HOSPITAL_BASED_OUTPATIENT_CLINIC_OR_DEPARTMENT_OTHER): Payer: Medicare Other

## 2022-11-10 ENCOUNTER — Emergency Department (HOSPITAL_BASED_OUTPATIENT_CLINIC_OR_DEPARTMENT_OTHER)
Admission: EM | Admit: 2022-11-10 | Discharge: 2022-11-10 | Disposition: A | Discharge status: Home / Self Care | Payer: Medicare Other | Attending: Emergency Medicine | Admitting: Emergency Medicine

## 2022-11-10 DIAGNOSIS — I10 Essential (primary) hypertension: Secondary | ICD-10-CM | POA: Insufficient documentation

## 2022-11-10 DIAGNOSIS — Z79899 Other long term (current) drug therapy: Secondary | ICD-10-CM | POA: Insufficient documentation

## 2022-11-10 DIAGNOSIS — N281 Cyst of kidney, acquired: Secondary | ICD-10-CM | POA: Diagnosis not present

## 2022-11-10 DIAGNOSIS — K5792 Diverticulitis of intestine, part unspecified, without perforation or abscess without bleeding: Secondary | ICD-10-CM | POA: Diagnosis not present

## 2022-11-10 DIAGNOSIS — K449 Diaphragmatic hernia without obstruction or gangrene: Secondary | ICD-10-CM | POA: Diagnosis not present

## 2022-11-10 DIAGNOSIS — R1032 Left lower quadrant pain: Secondary | ICD-10-CM | POA: Diagnosis not present

## 2022-11-10 LAB — CBC WITH DIFFERENTIAL/PLATELET
Abs Immature Granulocytes: 0.03 10*3/uL (ref 0.00–0.07)
Basophils Absolute: 0 10*3/uL (ref 0.0–0.1)
Basophils Relative: 0 %
Eosinophils Absolute: 0 10*3/uL (ref 0.0–0.5)
Eosinophils Relative: 0 %
HCT: 30.4 % — ABNORMAL LOW (ref 36.0–46.0)
Hemoglobin: 9.6 g/dL — ABNORMAL LOW (ref 12.0–15.0)
Immature Granulocytes: 0 %
Lymphocytes Relative: 14 %
Lymphs Abs: 1 10*3/uL (ref 0.7–4.0)
MCH: 29.4 pg (ref 26.0–34.0)
MCHC: 31.6 g/dL (ref 30.0–36.0)
MCV: 93 fL (ref 80.0–100.0)
Monocytes Absolute: 0.3 10*3/uL (ref 0.1–1.0)
Monocytes Relative: 5 %
Neutro Abs: 6 10*3/uL (ref 1.7–7.7)
Neutrophils Relative %: 81 %
Platelets: 259 10*3/uL (ref 150–400)
RBC: 3.27 MIL/uL — ABNORMAL LOW (ref 3.87–5.11)
RDW: 16.7 % — ABNORMAL HIGH (ref 11.5–15.5)
WBC: 7.4 10*3/uL (ref 4.0–10.5)
nRBC: 0 % (ref 0.0–0.2)

## 2022-11-10 LAB — COMPREHENSIVE METABOLIC PANEL
ALT: 12 U/L (ref 0–44)
AST: 17 U/L (ref 15–41)
Albumin: 3 g/dL — ABNORMAL LOW (ref 3.5–5.0)
Alkaline Phosphatase: 13 U/L — ABNORMAL LOW (ref 38–126)
Anion gap: 9 (ref 5–15)
BUN: 32 mg/dL — ABNORMAL HIGH (ref 8–23)
CO2: 28 mmol/L (ref 22–32)
Calcium: 8.7 mg/dL — ABNORMAL LOW (ref 8.9–10.3)
Chloride: 101 mmol/L (ref 98–111)
Creatinine, Ser: 1.15 mg/dL — ABNORMAL HIGH (ref 0.44–1.00)
GFR, Estimated: 44 mL/min — ABNORMAL LOW (ref >60)
Glucose, Bld: 120 mg/dL — ABNORMAL HIGH (ref 70–99)
Potassium: 4.1 mmol/L (ref 3.5–5.1)
Sodium: 138 mmol/L (ref 135–145)
Total Bilirubin: 0.7 mg/dL (ref <1.2)
Total Protein: 6.9 g/dL (ref 6.5–8.1)

## 2022-11-10 LAB — URINALYSIS, ROUTINE W REFLEX MICROSCOPIC
Bilirubin Urine: NEGATIVE
Glucose, UA: NEGATIVE mg/dL
Hgb urine dipstick: NEGATIVE
Ketones, ur: NEGATIVE mg/dL
Leukocytes,Ua: NEGATIVE
Nitrite: NEGATIVE
Protein, ur: NEGATIVE mg/dL
Specific Gravity, Urine: 1.025 (ref 1.005–1.030)
pH: 6 (ref 5.0–8.0)

## 2022-11-10 LAB — LIPASE, BLOOD: Lipase: 25 U/L (ref 11–51)

## 2022-11-10 MED ORDER — AMOXICILLIN-POT CLAVULANATE 875-125 MG PO TABS: 1.0000 | ORAL_TABLET | Freq: Two times a day (BID) | ORAL | 0 refills | Status: DC

## 2022-11-10 MED ORDER — ONDANSETRON HCL 4 MG PO TABS: 4.0000 mg | ORAL_TABLET | Freq: Four times a day (QID) | ORAL | 0 refills | Status: DC

## 2022-11-10 MED ORDER — ONDANSETRON 4 MG PO TBDP
4.0000 mg | ORAL_TABLET | Freq: Once | ORAL | Status: AC
  Administered 2022-11-10: 4 mg via ORAL
  Filled 2022-11-10: qty 1

## 2022-11-10 MED ORDER — IOHEXOL 300 MG/ML  SOLN
75.0000 mL | Freq: Once | INTRAMUSCULAR | Status: AC | PRN
  Administered 2022-11-10: 75 mL via INTRAVENOUS

## 2022-11-10 MED ORDER — SODIUM CHLORIDE 0.9 % IV BOLUS
500.0000 mL | Freq: Once | INTRAVENOUS | Status: AC
  Administered 2022-11-10: 500 mL via INTRAVENOUS

## 2022-11-10 MED ORDER — AMOXICILLIN-POT CLAVULANATE 875-125 MG PO TABS
1.0000 | ORAL_TABLET | Freq: Once | ORAL | Status: AC
  Administered 2022-11-10: 1 via ORAL
  Filled 2022-11-10: qty 1

## 2022-11-10 NOTE — ED Provider Notes (Signed)
Care of patient received from prior provider at 3:29 PM, please see their note for complete H/P and care plan.  Received handoff per ED course.  Clinical Course as of 11/10/22 1529  Sat Nov 10, 2022  1528 Stable  83 YOF with a ccx of AP.  [CC]  1528 F/U CT. Benign exam. F/u urine. [CC]    Clinical Course User Index [CC] Glyn Ade, MD    Reassessment: Patient's history of present on his physicals and findings are most consistent with diverticulitis per the findings on CT.  Will treat with Augmentin given her history of poor reaction to Cipro Flagyl. Given her age and comorbidities I discussed admitting her to the hospital but patient has declined this stating that she would feel comfortable to antibiotic and follow-up with PCP next week.  Will send Zofran to history of nausea.  First dose administered in emergency room patient referred to outpatient care and management in the interim with strict return precautions directly discussed with patient and daughter at bedside.     Glyn Ade, MD 11/10/22 [phone]

## 2022-11-10 NOTE — ED Provider Notes (Signed)
Emergency Department Provider Note   I have reviewed the triage vital signs and the nursing notes.   HISTORY  Chief Complaint Abdominal Pain   HPI [name] [name] is a 89 y.o. female with past history reviewed below presents emergency department with left side abdominal pain now radiating more to the right.  Symptoms have been ongoing for the past several days.  She has increased pain at night with some associated loose stools.  No nausea or vomiting.  No chest pain, shortness of breath, cough.  Afebrile.  She is eating and drinking well.  Patient is here with family states she is awake, alert, conversational as always.  Patient does note a prior history of diverticulitis.    Past Medical History:  Diagnosis Date   Anemia    Anxiety    Arthritis    hips, knees   Current use of [place] Agostinelli term anticoagulation    Diverticulitis of colon 02/06/2015   Gait difficulty    GERD (gastroesophageal reflux disease)    Headache(784.0) 06/22/2012   Hyperlipidemia    Hypertension    IBS (irritable bowel syndrome)    Incontinence    Loss of weight 06/05/2015   Medicare annual wellness visit, subsequent 02/05/2014   Melena    Mild mitral regurgitation    Mild tricuspid regurgitation    Mitral and aortic regurgitation    Mitral valve prolapse    hx of - not seen on recent echoes   Osteopenia    Paroxysmal atrial flutter (HCC)    Pedal edema 10/01/2016   Persistent atrial fibrillation (HCC)    Personal history of colonic polyps    Renal lithiasis 06/26/2015   Sinus bradycardia    Thyroid disease    hypo   Vitamin B12 deficiency 03/16/2016   Vitamin D deficiency     Review of Systems  Constitutional: No fever/chills Cardiovascular: Denies chest pain. Respiratory: Denies shortness of breath. Gastrointestinal: Positive abdominal pain.  No nausea, no vomiting.  No diarrhea.  No constipation. Genitourinary: Negative for dysuria. Musculoskeletal: Negative for back pain. Skin: Negative for  rash. Neurological: Negative for headaches.  ____________________________________________   PHYSICAL EXAM:  VITAL SIGNS: ED Triage Vitals  Encounter Vitals Group     BP 11/10/22 1215 122/63     Pulse Rate 11/10/22 1215 (!) 106     Resp 11/10/22 1215 16     Temp 11/10/22 1215 (!) 96.4 F (35.8 C)     Temp Source 11/10/22 1215 Tympanic     SpO2 11/10/22 1215 97 %     Weight 11/10/22 1215 80 lb (36.3 kg)     Height 11/10/22 1215 4\' 11"  (1.499 m)   Constitutional: Alert and oriented. Well appearing and in no acute distress. Eyes: Conjunctivae are normal.  Head: Atraumatic. Nose: No congestion/rhinnorhea. Mouth/Throat: Mucous membranes are moist.   Neck: No stridor.   Cardiovascular: Normal rate, regular rhythm. Good peripheral circulation. Grossly normal heart sounds.   Respiratory: Normal respiratory effort.  No retractions. Lungs CTAB. Gastrointestinal: Soft with LLQ tenderness. No peritonitis. No distention.  Musculoskeletal: No gross deformities of extremities. Neurologic:  Normal speech and language.  Skin:  Skin is warm, dry and intact. No rash noted.  ____________________________________________   LABS (all labs ordered are listed, but only abnormal results are displayed)  Labs Reviewed  COMPREHENSIVE METABOLIC PANEL - Abnormal; Notable for the following components:      Result Value   Glucose, Bld 120 (*)    BUN 32 (*)  Creatinine, Ser 1.15 (*)    Calcium 8.7 (*)    Albumin 3.0 (*)    Alkaline Phosphatase 13 (*)    GFR, Estimated 44 (*)    All other components within normal limits  CBC WITH DIFFERENTIAL/PLATELET - Abnormal; Notable for the following components:   RBC 3.27 (*)    Hemoglobin 9.6 (*)    HCT 30.4 (*)    RDW 16.7 (*)    All other components within normal limits  LIPASE, BLOOD  URINALYSIS, ROUTINE W REFLEX MICROSCOPIC   ____________________________________________  RADIOLOGY  No results  found.  ____________________________________________   PROCEDURES  Procedure(s) performed:   Procedures   ____________________________________________   INITIAL IMPRESSION / ASSESSMENT AND PLAN / ED COURSE  Pertinent labs & imaging results that were available during my care of the patient were reviewed by me and considered in my medical decision making (see chart for details).   This patient is Presenting for Evaluation of abdominal pain, which does require a range of treatment options, and is a complaint that involves a high risk of morbidity and mortality.  The Differential Diagnoses includes but is not exclusive to acute cholecystitis, intrathoracic causes for epigastric abdominal pain, gastritis, duodenitis, pancreatitis, small bowel or large bowel obstruction, abdominal aortic aneurysm, hernia, gastritis, etc.   Critical Interventions-    Medications  sodium chloride 0.9 % bolus 500 mL (0 mLs Intravenous Stopped 11/10/22 1406)  iohexol (OMNIPAQUE) 300 MG/ML solution 75 mL (75 mLs Intravenous Contrast Given 11/10/22 1506)    Reassessment after intervention:     I did obtain Additional Historical Information from family at bedside.    Clinical Laboratory Tests Ordered, included CBC without leukocytosis.  CMP shows normal LFTs and bilirubin.  Lipase normal.  UA pending.  Radiologic Tests Ordered, included CT abdomen/pelvis. I independently interpreted the images and agree with radiology interpretation.   Cardiac Monitor Tracing which shows NSR.    Social Determinants of Health Risk patient is a non-smoker.   Consult complete with  Medical Decision Making: Summary:  Patient presents emergency department with lower abdominal pain worsening in the past couple of days.  Mild tenderness in the left lower quadrant without peritonitis.  Patient arrives with borderline SIRS vitals but no fever or other infection symptoms at home.  No altered mental status.  Doubt sepsis.  Plan  for screening blood work and CT abdomen pelvis given the patient's age and exam.   Reevaluation with update and discussion with   ***Considered admission***  Patient's presentation is most consistent with acute presentation with potential threat to life or bodily function.   Disposition:   ____________________________________________  FINAL CLINICAL IMPRESSION(S) / ED DIAGNOSES  Final diagnoses:  None     NEW OUTPATIENT MEDICATIONS STARTED DURING THIS VISIT:  New Prescriptions   No medications on file    Note:  This document was prepared using Dragon voice recognition software and may include unintentional dictation errors.  Alona Bene, MD, [facility]

## 2022-11-10 NOTE — ED Notes (Signed)
Patient transported to CT 

## 2022-11-10 NOTE — ED Triage Notes (Signed)
Pt c/o LLQ pain "for a while" but noticed it more over the last 2 nights; intermittent loose stools; denies NV; denies back or flank pain

## 2022-11-12 ENCOUNTER — Telehealth: Payer: Self-pay | Admitting: *Deleted

## 2022-11-12 NOTE — Transitions of Care (Post Inpatient/ED Visit) (Signed)
   11/12/2022  Name: JONNAY MANDRACCHIA MRN: [id number] DOB: [birth year]  Today's TOC FU Call Status: Today's TOC FU Call Status:: Successful TOC FU Call Completed TOC FU Call Complete Date: 11/12/22 Patient's Name and Date of Birth confirmed.  Transition Care Management Follow-up Telephone Call Date of Discharge: 11/10/22 Discharge Facility: [facility] Type of Discharge: Emergency Department Reason for ED Visit: Other: How have you been since you were released from the hospital?: Better Any questions or concerns?: No  Items Reviewed: Did you receive and understand the discharge instructions provided?: Yes Medications obtained,verified, and reconciled?: Yes (Medications Reviewed) Any new allergies since your discharge?: No Dietary orders reviewed?: NA Do you have support at home?: Yes People in Home: child(ren), adult  Medications Reviewed Today: Medications Reviewed Today   Medications were not reviewed in this encounter     Home Care and Equipment/Supplies: Were Home Health Services Ordered?: No Any new equipment or medical supplies ordered?: No  Functional Questionnaire: Do you need assistance with bathing/showering or dressing?: No Do you need assistance with meal preparation?: No Do you need assistance with eating?: No Do you have difficulty maintaining continence: No Do you need assistance with getting out of bed/getting out of a chair/moving?: No Do you have difficulty managing or taking your medications?: No  Follow up appointments reviewed: PCP Follow-up appointment confirmed?: Yes MD Provider Line Number:[phone] Given: No Date of PCP follow-up appointment?: 11/13/22 Follow-up Provider: Hyman Hopes, NP Specialist Hospital Follow-up appointment confirmed?: NA Do you need transportation to your follow-up appointment?: No Do you understand care options if your condition(s) worsen?: Yes-patient verbalized understanding    Donne Anon, CMA

## 2022-11-13 ENCOUNTER — Ambulatory Visit (INDEPENDENT_AMBULATORY_CARE_PROVIDER_SITE_OTHER): Payer: Medicare Other | Admitting: Family Medicine

## 2022-11-13 ENCOUNTER — Encounter: Payer: Self-pay | Admitting: Family Medicine

## 2022-11-13 VITALS — BP 118/60 | HR 101 | Ht 59.0 in | Wt 79.2 lb

## 2022-11-13 DIAGNOSIS — K5792 Diverticulitis of intestine, part unspecified, without perforation or abscess without bleeding: Secondary | ICD-10-CM

## 2022-11-13 DIAGNOSIS — Z09 Encounter for follow-up examination after completed treatment for conditions other than malignant neoplasm: Secondary | ICD-10-CM

## 2022-11-13 NOTE — Patient Instructions (Addendum)
Finish antibiotics - try taking with bland food and water to see if this helps with the diarrhea. Add probiotic 2 hours apart from antibiotic dose. Monitor for any new or worsening symptoms.  Diverticulitis care: - During acute illness: bowel rest with clear liquid diet for 48 hours. Slowly transition to low-fiber diet, then bland/soft foods. Stay well hydrated. Avoid laxatives, enemas, etc during acute phase. - After resolution of acute illness: high fiber diet, stool softeners. Consider decreased intake of nondigestible foods like corn and nuts (mixed research/data on this). - For any severe symptoms, go to the Emergency Department

## 2022-11-13 NOTE — Progress Notes (Signed)
   Acute Office Visit  Subjective:     Patient ID: [name] [name], female    DOB: June 30, 1924, 89 y.o.   MRN: [id number]  Chief Complaint  Patient presents with   Hospitalization Follow-up    HPI Patient is in today for hospital follow-up.   Discussed the use of AI scribe software for clinical note transcription with the patient, who gave verbal consent to proceed.  History of Present Illness   The patient, diagnosed with diverticulitis, presented following an emergency room visit for abdominal pain and upset stomach. She was prescribed Augmentin, which has since improved her symptoms, but have caused diarrhea. The patient reported an incident of fecal incontinence during sleep and frequent bowel movements. She has a history of adverse reactions to Cipro and Flagyl, hence was prescribed Augmentin.  The patient's abdominal pain has largely resolved, with no associated fever, nausea, or vomiting. She has been on a liquid diet for two days and started eating bland foods today and has tolerated well so far. She also takes a probiotic supplement.              ROS All review of systems negative except what is listed in the HPI      Objective:    BP 118/60   Pulse (!) 101   Ht 4\' 11"  (1.499 m)   Wt 79 lb 4 oz (35.9 kg)   BMI 16.01 kg/m    Physical Exam Vitals reviewed.  Constitutional:      Appearance: Normal appearance.  Cardiovascular:     Rate and Rhythm: Normal rate and regular rhythm.     Heart sounds: Normal heart sounds.  Pulmonary:     Effort: Pulmonary effort is normal.     Breath sounds: Normal breath sounds.  Abdominal:     Comments: Mild tenderness to LLQ  Skin:    General: Skin is warm and dry.  Neurological:     Mental Status: She is alert and oriented to person, place, and time.  Psychiatric:        Mood and Affect: Mood normal.        Behavior: Behavior normal.        Thought Content: Thought content normal.        Judgment: Judgment normal.        No results found for any visits on 11/13/22.      Assessment & Plan:   Problem List Items Addressed This Visit   None Visit Diagnoses     Hospital discharge follow-up    -  Primary   Diverticulitis         Recent ER visit with diagnosis of diverticulitis. Currently on Augmentin with improvement in abdominal pain but experiencing diarrhea. No fever, nausea, or vomiting. -Continue Augmentin as prescribed. -Start a bland diet and gradually reintroduce regular foods. -Continue probiotic, ensuring it is taken at least two hours apart from the antibiotic. -Return if pain worsens or new symptoms develop.  -Labs stable 3 days ago.       No orders of the defined types were placed in this encounter.   Return if symptoms worsen or fail to improve.  Clayborne Dana, NP

## 2022-11-14 ENCOUNTER — Inpatient Hospital Stay: Payer: Medicare Other

## 2022-11-14 VITALS — BP 124/76 | HR 72 | Temp 98.2°F | Resp 20

## 2022-11-14 DIAGNOSIS — Z7901 Long term (current) use of anticoagulants: Secondary | ICD-10-CM

## 2022-11-14 DIAGNOSIS — D631 Anemia in chronic kidney disease: Secondary | ICD-10-CM | POA: Diagnosis not present

## 2022-11-14 DIAGNOSIS — E611 Iron deficiency: Secondary | ICD-10-CM | POA: Diagnosis not present

## 2022-11-14 DIAGNOSIS — N1831 Chronic kidney disease, stage 3a: Secondary | ICD-10-CM | POA: Diagnosis not present

## 2022-11-14 MED ORDER — SODIUM CHLORIDE 0.9 % IV SOLN: Freq: Once | INTRAVENOUS | Status: AC

## 2022-11-14 MED ORDER — IRON SUCROSE 20 MG/ML IV SOLN
200.0000 mg | Freq: Once | INTRAVENOUS | Status: AC
  Administered 2022-11-14: 200 mg via INTRAVENOUS
  Filled 2022-11-14: qty 10

## 2022-11-21 ENCOUNTER — Inpatient Hospital Stay: Payer: Medicare Other

## 2022-11-21 VITALS — BP 123/49 | HR 60 | Temp 97.5°F | Resp 21

## 2022-11-21 DIAGNOSIS — E611 Iron deficiency: Secondary | ICD-10-CM | POA: Diagnosis not present

## 2022-11-21 DIAGNOSIS — Z7901 Long term (current) use of anticoagulants: Secondary | ICD-10-CM

## 2022-11-21 DIAGNOSIS — N1831 Chronic kidney disease, stage 3a: Secondary | ICD-10-CM | POA: Diagnosis not present

## 2022-11-21 DIAGNOSIS — D631 Anemia in chronic kidney disease: Secondary | ICD-10-CM | POA: Diagnosis not present

## 2022-11-21 MED ORDER — IRON SUCROSE 20 MG/ML IV SOLN
200.0000 mg | Freq: Once | INTRAVENOUS | Status: AC
Start: 2022-11-21 — End: 2022-11-21
  Administered 2022-11-21: 200 mg via INTRAVENOUS
  Filled 2022-11-21: qty 10

## 2022-11-21 MED ORDER — SODIUM CHLORIDE 0.9 % IV SOLN: Freq: Once | INTRAVENOUS | Status: AC

## 2022-11-21 NOTE — Patient Instructions (Signed)
Iron Sucrose Injection What is this medication? IRON SUCROSE (EYE ern SOO krose) treats low levels of iron (iron deficiency anemia) in people with kidney disease. Iron is a mineral that plays an important role in making red blood cells, which carry oxygen from your lungs to the rest of your body. This medicine may be used for other purposes; ask your health care provider or pharmacist if you have questions. COMMON BRAND NAME(S): Venofer What should I tell my care team before I take this medication? They need to know if you have any of these conditions: Anemia not caused by low iron levels Heart disease High levels of iron in the blood Kidney disease Liver disease An unusual or allergic reaction to iron, other medications, foods, dyes, or preservatives Pregnant or trying to get pregnant Breastfeeding How should I use this medication? This medication is for infusion into a vein. It is given in a hospital or clinic setting. Talk to your care team about the use of this medication in children. While this medication may be prescribed for children as young as 2 years for selected conditions, precautions do apply. Overdosage: If you think you have taken too much of this medicine contact a poison control center or emergency room at once. NOTE: This medicine is only for you. Do not share this medicine with others. What if I miss a dose? Keep appointments for follow-up doses. It is important not to miss your dose. Call your care team if you are unable to keep an appointment. What may interact with this medication? Do not take this medication with any of the following: Deferoxamine Dimercaprol Other iron products This medication may also interact with the following: Chloramphenicol Deferasirox This list may not describe all possible interactions. Give your health care provider a list of all the medicines, herbs, non-prescription drugs, or dietary supplements you use. Also tell them if you smoke,  drink alcohol, or use illegal drugs. Some items may interact with your medicine. What should I watch for while using this medication? Visit your care team regularly. Tell your care team if your symptoms do not start to get better or if they get worse. You may need blood work done while you are taking this medication. You may need to follow a special diet. Talk to your care team. Foods that contain iron include: whole grains/cereals, dried fruits, beans, or peas, leafy green vegetables, and organ meats (liver, kidney). What side effects may I notice from receiving this medication? Side effects that you should report to your care team as soon as possible: Allergic reactions--skin rash, itching, hives, swelling of the face, lips, tongue, or throat Low blood pressure--dizziness, feeling faint or lightheaded, blurry vision Shortness of breath Side effects that usually do not require medical attention (report to your care team if they continue or are bothersome): Flushing Headache Joint pain Muscle pain Nausea Pain, redness, or irritation at injection site This list may not describe all possible side effects. Call your doctor for medical advice about side effects. You may report side effects to FDA at 1-800-FDA-1088. Where should I keep my medication? This medication is given in a hospital or clinic. It will not be stored at home. NOTE: This sheet is a summary. It may not cover all possible information. If you have questions about this medicine, talk to your doctor, pharmacist, or health care provider.  2024 Elsevier/Gold Standard (2022-05-25 00:00:00)

## 2022-11-25 NOTE — Assessment & Plan Note (Signed)
Rate controlled, tolerating current meds. Follows with cardiology

## 2022-11-25 NOTE — Assessment & Plan Note (Signed)
Supplement and monitor 

## 2022-11-25 NOTE — Assessment & Plan Note (Signed)
Encourage heart healthy diet such as MIND or DASH diet, increase exercise, avoid trans fats, simple carbohydrates and processed foods, consider a krill or fish or flaxseed oil cap daily.  °

## 2022-11-25 NOTE — Assessment & Plan Note (Signed)
On Levothyroxine, continue to monitor 

## 2022-11-25 NOTE — Assessment & Plan Note (Signed)
hgba1c acceptable, minimize simple carbs. Increase exercise as tolerated.  

## 2022-11-27 ENCOUNTER — Inpatient Hospital Stay: Payer: Medicare Other

## 2022-11-27 ENCOUNTER — Ambulatory Visit (INDEPENDENT_AMBULATORY_CARE_PROVIDER_SITE_OTHER): Payer: Medicare Other | Admitting: Family Medicine

## 2022-11-27 VITALS — BP 146/52 | HR 64 | Temp 98.2°F | Resp 20

## 2022-11-27 VITALS — BP 124/76 | HR 64 | Temp 98.3°F | Resp 16 | Ht 59.0 in | Wt 79.8 lb

## 2022-11-27 DIAGNOSIS — Z23 Encounter for immunization: Secondary | ICD-10-CM

## 2022-11-27 DIAGNOSIS — Z7901 Long term (current) use of anticoagulants: Secondary | ICD-10-CM

## 2022-11-27 DIAGNOSIS — E039 Hypothyroidism, unspecified: Secondary | ICD-10-CM | POA: Diagnosis not present

## 2022-11-27 DIAGNOSIS — N1831 Chronic kidney disease, stage 3a: Secondary | ICD-10-CM | POA: Diagnosis not present

## 2022-11-27 DIAGNOSIS — E559 Vitamin D deficiency, unspecified: Secondary | ICD-10-CM

## 2022-11-27 DIAGNOSIS — E782 Mixed hyperlipidemia: Secondary | ICD-10-CM | POA: Diagnosis not present

## 2022-11-27 DIAGNOSIS — K5792 Diverticulitis of intestine, part unspecified, without perforation or abscess without bleeding: Secondary | ICD-10-CM | POA: Diagnosis not present

## 2022-11-27 DIAGNOSIS — R739 Hyperglycemia, unspecified: Secondary | ICD-10-CM

## 2022-11-27 DIAGNOSIS — E611 Iron deficiency: Secondary | ICD-10-CM | POA: Diagnosis not present

## 2022-11-27 DIAGNOSIS — D500 Iron deficiency anemia secondary to blood loss (chronic): Secondary | ICD-10-CM

## 2022-11-27 DIAGNOSIS — D631 Anemia in chronic kidney disease: Secondary | ICD-10-CM

## 2022-11-27 DIAGNOSIS — I4819 Other persistent atrial fibrillation: Secondary | ICD-10-CM | POA: Diagnosis not present

## 2022-11-27 DIAGNOSIS — E538 Deficiency of other specified B group vitamins: Secondary | ICD-10-CM

## 2022-11-27 LAB — CBC
HCT: 30.1 % — ABNORMAL LOW (ref 36.0–46.0)
Hemoglobin: 9.5 g/dL — ABNORMAL LOW (ref 12.0–15.0)
MCH: 30.1 pg (ref 26.0–34.0)
MCHC: 31.6 g/dL (ref 30.0–36.0)
MCV: 95.3 fL (ref 80.0–100.0)
Platelets: 240 10*3/uL (ref 150–400)
RBC: 3.16 MIL/uL — ABNORMAL LOW (ref 3.87–5.11)
RDW: 17.7 % — ABNORMAL HIGH (ref 11.5–15.5)
WBC: 6.5 10*3/uL (ref 4.0–10.5)
nRBC: 0 % (ref 0.0–0.2)

## 2022-11-27 LAB — CBC WITH DIFFERENTIAL/PLATELET
Basophils Absolute: 0 10*3/uL (ref 0.0–0.1)
Basophils Relative: 0.6 % (ref 0.0–3.0)
Eosinophils Absolute: 0 10*3/uL (ref 0.0–0.7)
Eosinophils Relative: 0.4 % (ref 0.0–5.0)
HCT: 31.3 % — ABNORMAL LOW (ref 36.0–46.0)
Hemoglobin: 10.3 g/dL — ABNORMAL LOW (ref 12.0–15.0)
Lymphocytes Relative: 21 % (ref 12.0–46.0)
Lymphs Abs: 1.1 10*3/uL (ref 0.7–4.0)
MCHC: 32.9 g/dL (ref 30.0–36.0)
MCV: 93.7 fL (ref 78.0–100.0)
Monocytes Absolute: 0.3 10*3/uL (ref 0.1–1.0)
Monocytes Relative: 5.7 % (ref 3.0–12.0)
Neutro Abs: 3.9 10*3/uL (ref 1.4–7.7)
Neutrophils Relative %: 72.3 % (ref 43.0–77.0)
Platelets: 254 10*3/uL (ref 150.0–400.0)
RBC: 3.34 Mil/uL — ABNORMAL LOW (ref 3.87–5.11)
RDW: 18.2 % — ABNORMAL HIGH (ref 11.5–15.5)
WBC: 5.5 10*3/uL (ref 4.0–10.5)

## 2022-11-27 LAB — TSH: TSH: 7.05 u[IU]/mL — ABNORMAL HIGH (ref 0.35–5.50)

## 2022-11-27 LAB — LIPID PANEL
Cholesterol: 166 mg/dL (ref 0–200)
HDL: 41.5 mg/dL (ref >39.00)
LDL Cholesterol: 101 mg/dL — ABNORMAL HIGH (ref 0–99)
NonHDL: 124.44
Total CHOL/HDL Ratio: 4
Triglycerides: 119 mg/dL (ref 0.0–149.0)
VLDL: 23.8 mg/dL (ref 0.0–40.0)

## 2022-11-27 LAB — COMPREHENSIVE METABOLIC PANEL
ALT: 6 U/L (ref 0–35)
AST: 14 U/L (ref 0–37)
Albumin: 3.7 g/dL (ref 3.5–5.2)
Alkaline Phosphatase: 11 U/L — ABNORMAL LOW (ref 39–117)
BUN: 28 mg/dL — ABNORMAL HIGH (ref 6–23)
CO2: 31 meq/L (ref 19–32)
Calcium: 9.1 mg/dL (ref 8.4–10.5)
Chloride: 101 meq/L (ref 96–112)
Creatinine, Ser: 0.87 mg/dL (ref 0.40–1.20)
GFR: 56.06 mL/min — ABNORMAL LOW (ref >60.00)
Glucose, Bld: 97 mg/dL (ref 70–99)
Potassium: 4.4 meq/L (ref 3.5–5.1)
Sodium: 139 meq/L (ref 135–145)
Total Bilirubin: 0.5 mg/dL (ref 0.2–1.2)
Total Protein: 6.8 g/dL (ref 6.0–8.3)

## 2022-11-27 LAB — VITAMIN D 25 HYDROXY (VIT D DEFICIENCY, FRACTURES): VITD: 38.14 ng/mL (ref 30.00–100.00)

## 2022-11-27 LAB — HIGH SENSITIVITY CRP: CRP, High Sensitivity: 18.51 mg/L — ABNORMAL HIGH (ref 0.000–5.000)

## 2022-11-27 LAB — VITAMIN B12: Vitamin B-12: 255 pg/mL (ref 211–911)

## 2022-11-27 LAB — HEMOGLOBIN A1C: Hgb A1c MFr Bld: 4.9 % (ref 4.6–6.5)

## 2022-11-27 MED ORDER — DARBEPOETIN ALFA 300 MCG/0.6ML IJ SOSY
300.0000 ug | PREFILLED_SYRINGE | Freq: Once | INTRAMUSCULAR | Status: AC
Start: 2022-11-27 — End: 2022-11-27
  Administered 2022-11-27: 300 ug via SUBCUTANEOUS
  Filled 2022-11-27: qty 0.6

## 2022-11-27 MED ORDER — AMOXICILLIN-POT CLAVULANATE 875-125 MG PO TABS: 1.0000 | ORAL_TABLET | Freq: Two times a day (BID) | ORAL | 0 refills | Status: DC

## 2022-11-27 NOTE — Patient Instructions (Addendum)
Increase protein and drink 60-80 ounces of fluids daily  RSV, Respiratory Syncitial Virus Vaccine, Arexvy at pharmacy Covid booster   Diverticulitis   Diverticulitis happens when poop (stool) and bacteria get trapped in small pouches in the colon called diverticula. These pouches may form if you have a condition called diverticulosis. When the poop and bacteria get trapped, it can cause an infection and inflammation. Diverticulitis may cause severe stomach pain and diarrhea. It can also lead to tissue dam                                                         vcccvvcvage in your colon. This can cause bleeding or blockage. In some cases, the diverticula may burst (rupture). This can cause infected poop to go into other parts of your abdomen. What are the causes? This condition is caused by poop getting trapped in the diverticula. This allows bacteria to grow. It can lead to inflammation and infection. What increases the risk? You are more likely to get this condition if you have diverticulosis. You are also more at risk if: You are overweight or obese. You do not get enough exercise. You drink alcohol. You smoke. You eat a lot of red meat, such as beef, pork, or lamb. You do not get enough fiber. Foods high in fiber include fruits, vegetables, beans, nuts, and whole grains. You are over 87 years of age. What are the signs or symptoms? Symptoms of this condition may include: Pain and tenderness in the abdomen. This pain is often felt on the left side but may occur in other spots. Fever and chills. Nausea and vomiting. Cramping. Bloating. Changes in how often you poop. Blood in your poop. How is this diagnosed? This condition is diagnosed based on your medical history and a physical exam. You may also have tests done to make sure there is nothing else causing your condition. These tests may include: Blood tests. Tests done on your pee (urine). A CT scan of the abdomen. You may need  to have a colonoscopy. This is an exam to look at your whole large intestine. During the exam, a tube is put into the opening of your butt (anus) and then moved into your rectum, colon, and other parts of the large intestine. This exam is done to look at the diverticula. It can also see if there is something else that may be causing your symptoms. How is this treated? Most cases are mild and can be treated at home. You may be told to: Take over-the-counter pain medicine. Only eat and drink clear liquids. Take antibiotics. Rest. More severe cases may need to be treated at a hospital. Treatment may include: Not eating or drinking. Taking pain medicines. Getting antibiotics through an IV. Getting fluids and nutrition through an IV. Surgery. Follow these instructions at home: Medicines Take over-the-counter and prescription medicines only as told by your health care provider. These include fiber supplements, probiotics, and medicines to soften your poop (stool softeners). If you were prescribed antibiotics, take them as told by your provider. Do not stop using the antibiotic even if you start to feel better. Ask your provider if the medicine prescribed to you requires you to avoid driving or using machinery. Eating and drinking  Follow the diet told by your provider. You may need  to only eat and drink liquids. After your symptoms get better, you may be able to return to a more normal diet. You may be told to eat at least 25 grams (25 g) of fiber each day. Fiber makes it easier to poop. Healthy sources of fiber include: Berries. One cup has 4-8 g of fiber. Beans or lentils. One-half cup has 5-8 g of fiber. Green vegetables. One cup has 4 g of fiber. Avoid eating red meat. General instructions Do not use any products that contain nicotine or tobacco. These products include cigarettes, chewing tobacco, and vaping devices, such as e-cigarettes. If you need help quitting, ask your  provider. Exercise for at least 30 minutes, 3 times a week. Exercise hard enough to raise your heart rate and break a sweat. Contact a health care provider if: Your pain gets worse. Your pooping does not go back to normal. Your symptoms do not get better with treatment. Your symptoms get worse all of a sudden. You have a fever. You vomit more than one time. Your poop is bloody, black, or tarry. This information is not intended to replace advice given to you by your health care provider. Make sure you discuss any questions you have with your health care provider. Document Revised: 09/14/2021 Document Reviewed: 09/14/2021 Elsevier Patient Education  2024 [employer].

## 2022-11-27 NOTE — Patient Instructions (Signed)

## 2022-11-28 ENCOUNTER — Encounter: Payer: Self-pay | Admitting: Family Medicine

## 2022-11-28 ENCOUNTER — Inpatient Hospital Stay: Payer: Medicare Other

## 2022-11-28 NOTE — Progress Notes (Signed)
Subjective:    Patient ID: [name] [name], female    DOB: [birth year], 89 y.o.   MRN: [id number]  Chief Complaint  Patient presents with  . Follow-up    HPI Discussed the use of AI scribe software for clinical note transcription with the patient, who gave verbal consent to proceed.  History of Present Illness   The patient, with a history of diverticulitis, presents with ongoing symptoms following a recent ER visit and antibiotic treatment. She reports a loss of appetite and describes food as tasting 'awful.' She also experiences a dull ache in her left side, which is more noticeable when lying down, rating it as a 5/10 for pain. During the day, the pain is less severe, around a 2/10. She has intermittent loose stools but denies any blood in her stool. She recently received an iron infusion due to low levels. She is managing to maintain fluid intake, mainly through Boost drinks, apple juice, and tea, but struggles with solid food intake due to the loss of appetite.        Past Medical History:  Diagnosis Date  . Anemia   . Anxiety   . Arthritis    hips, knees  . Current use of long term anticoagulation   . Diverticulitis of colon 02/06/2015  . Gait difficulty   . GERD (gastroesophageal reflux disease)   . Headache(784.0) 06/22/2012  . Hyperlipidemia   . Hypertension   . IBS (irritable bowel syndrome)   . Incontinence   . Loss of weight 06/05/2015  . Medicare annual wellness visit, subsequent 02/05/2014  . Melena   . Mild mitral regurgitation   . Mild tricuspid regurgitation   . Mitral and aortic regurgitation   . Mitral valve prolapse    hx of - not seen on recent echoes  . Osteopenia   . Paroxysmal atrial flutter (HCC)   . Pedal edema 10/01/2016  . Persistent atrial fibrillation (HCC)   . Personal history of colonic polyps   . Renal lithiasis 06/26/2015  . Sinus bradycardia   . Thyroid disease    hypo  . Vitamin B12 deficiency 03/16/2016  . Vitamin D deficiency     Past  Surgical History:  Procedure Laterality Date  . BOTOX INJECTION N/A 09/03/2012   Procedure: BOTOX INJECTION;  Surgeon: Louis Meckel, MD;  Location: [facility];  Service: Endoscopy;  Laterality: N/A;  . BREAST BIOPSY Left   . BREAST LUMPECTOMY Right 08/18/2014   Procedure: RIGHT BREAST LUMPECTOMY;  Surgeon: Abigail Miyamoto, MD;  Location: [facility];  Service: General;  Laterality: Right;  . CARDIAC ELECTROPHYSIOLOGY MAPPING AND ABLATION    . CATARACT EXTRACTION, BILATERAL    . ESOPHAGOGASTRODUODENOSCOPY N/A 09/03/2012   Procedure: ESOPHAGOGASTRODUODENOSCOPY (EGD);  Surgeon: Louis Meckel, MD;  Location: [facility];  Service: Endoscopy;  Laterality: N/A;  . ESOPHAGOGASTRODUODENOSCOPY (EGD) WITH PROPOFOL N/A 12/02/2018   Procedure: ESOPHAGOGASTRODUODENOSCOPY (EGD) WITH PROPOFOL;  Surgeon: Sherrilyn Rist, MD;  Location: [facility];  Service: Gastroenterology;  Laterality: N/A;  . ESOPHAGOSCOPY W/ BOTOX INJECTION    . I & D EXTREMITY Right 11/06/2012   Procedure: IRRIGATION AND DEBRIDEMENT EXTREMITY Right Ring Finger;  Surgeon: Tami Ribas, MD;  Location: [facility];  Service: Orthopedics;  Laterality: Right;  . PARTIAL HYSTERECTOMY     ovaries left in place  . skin cancer removal      Family History  Problem Relation Age of Onset  . Diabetes Mother   . CVA Mother   .  Stroke Mother   . COPD Father   . Rheum arthritis Father   . Allergies Daughter   . COPD Daughter   . Stroke Daughter   . COPD Daughter        previous smoker  . Stroke Maternal Grandfather   . Heart disease Maternal Aunt   . Heart disease Maternal Uncle   . Colon cancer Neg Hx   . Colon polyps Neg Hx   . Esophageal cancer Neg Hx   . Gallbladder disease Neg Hx   . Kidney disease Neg Hx   . Heart attack Neg Hx   . Hypertension Neg Hx     Social History   Socioeconomic History  . Marital status: Widowed    Spouse name: Not on file  . Number of children: 5  . Years of education: Not on  file  . Highest education level: Not on file  Occupational History  . Occupation: retired    Associate Professor: RETIRED  Tobacco Use  . Smoking status: Never  . Smokeless tobacco: Never  Vaping Use  . Vaping status: Never Used  Substance and Sexual Activity  . Alcohol use: No  . Drug use: No  . Sexual activity: Not Currently    Comment: lives with Daughter, grandson and his family. no dietary restricitons.  Other Topics Concern  . Not on file  Social History Narrative  . Not on file   Social Determinants of Health   Financial Resource Strain: Low Risk  (12/11/2021)   Overall Financial Resource Strain (CARDIA)   . Difficulty of Paying Living Expenses: Not hard at all  Food Insecurity: No Food Insecurity (12/11/2021)   Hunger Vital Sign   . Worried About Programme researcher, broadcasting/film/video in the Last Year: Never true   . Ran Out of Food in the Last Year: Never true  Transportation Needs: No Transportation Needs (12/11/2021)   PRAPARE - Transportation   . Lack of Transportation (Medical): No   . Lack of Transportation (Non-Medical): No  Physical Activity: Inactive (12/11/2021)   Exercise Vital Sign   . Days of Exercise per Week: 0 days   . Minutes of Exercise per Session: 0 min  Stress: No Stress Concern Present (12/11/2021)   [employer] of Occupational Health - Occupational Stress Questionnaire   . Feeling of Stress : Not at all  Social Connections: Socially Isolated (12/11/2021)   Social Connection and Isolation Panel [NHANES]   . Frequency of Communication with Friends and Family: Once a week   . Frequency of Social Gatherings with Friends and Family: Once a week   . Attends Religious Services: Never   . Active Member of Clubs or Organizations: No   . Attends Banker Meetings: Never   . Marital Status: Widowed  Intimate Partner Violence: Not At Risk (12/11/2021)   Humiliation, Afraid, Rape, and Kick questionnaire   . Fear of Current or Ex-Partner: No   . Emotionally  Abused: No   . Physically Abused: No   . Sexually Abused: No    Outpatient Medications Prior to Visit  Medication Sig Dispense Refill  . ALPRAZolam (XANAX) 0.25 MG tablet Take 0.5-1 tablets (0.125-0.25 mg total) by mouth 2 (two) times daily as needed for anxiety. 30 tablet 0  . Cholecalciferol (VITAMIN D) 2000 UNITS CAPS Take 2,000 Units by mouth daily at 6 (six) AM.    . DERMA-SMOOTHE/FS BODY 0.01 % OIL Apply topically.    . famotidine (PEPCID) 20 MG tablet Take 20 mg by  mouth at bedtime.    . Ferrous Fumarate-Folic Acid (HEMOCYTE-F) 324-1 MG TABS Take 1 tablet by mouth daily. 30 tablet 5  . furosemide (LASIX) 20 MG tablet PLEASE SEE ATTACHED FOR DETAILED DIRECTIONS 90 tablet 0  . hydrOXYzine (ATARAX) 10 MG tablet Take 10 mg by mouth at bedtime as needed for itching.    . isosorbide mononitrate (IMDUR) 30 MG 24 hr tablet Take 1 tablet (30 mg total) by mouth daily. 90 tablet 2  . levocetirizine (XYZAL) 5 MG tablet Take 2.5 mg by mouth daily at 12 noon.    Marland Kitchen losartan (COZAAR) 25 MG tablet Take 1 tablet (25 mg total) by mouth daily. 90 tablet 3  . metoprolol succinate (TOPROL-XL) 25 MG 24 hr tablet Take 1 tablet (25 mg total) by mouth daily. 90 tablet 1  . ondansetron (ZOFRAN) 4 MG tablet Take 1 tablet (4 mg total) by mouth every 6 (six) hours. 12 tablet 0  . pantoprazole (PROTONIX) 40 MG tablet TAKE 1 TABLET BY MOUTH EVERY DAY 90 tablet 1  . TOLAK 4 % CREA Apply topically daily.    . diclofenac Sodium (VOLTAREN) 1 % GEL Apply 2 g topically 4 (four) times daily. (Patient not taking: Reported on 11/27/2022) 50 g 0  . nystatin (MYCOSTATIN/NYSTOP) powder Apply 1 Application topically 3 (three) times daily. (Patient not taking: Reported on 11/27/2022) 15 g 0  . amoxicillin-clavulanate (AUGMENTIN) 875-125 MG tablet Take 1 tablet by mouth every 12 (twelve) hours. (Patient not taking: Reported on 11/27/2022) 10 tablet 0   No facility-administered medications prior to visit.    Allergies  Allergen  Reactions  . Amlodipine Shortness Of Breath  . Darifenacin Hydrobromide Other (See Comments)    dizziness  . Sulfonamide Derivatives Other (See Comments)    dizziness  . Other Other (See Comments)    dizziness  . Tramadol Other (See Comments)    Insomnia, anorexia    Review of Systems  Gastrointestinal:  Positive for abdominal pain.      Objective:    Physical Exam Constitutional:      General: She is not in acute distress.    Appearance: Normal appearance. She is well-developed. She is not toxic-appearing.     Comments: frail  HENT:     Head: Normocephalic and atraumatic.     Right Ear: External ear normal.     Left Ear: External ear normal.     Nose: Nose normal.  Eyes:     General:        Right eye: No discharge.        Left eye: No discharge.     Conjunctiva/sclera: Conjunctivae normal.  Neck:     Thyroid: No thyromegaly.  Cardiovascular:     Rate and Rhythm: Normal rate and regular rhythm.     Heart sounds: Normal heart sounds. No murmur heard. Pulmonary:     Effort: Pulmonary effort is normal. No respiratory distress.     Breath sounds: Normal breath sounds.  Abdominal:     General: Bowel sounds are normal.     Palpations: Abdomen is soft.     Tenderness: There is no abdominal tenderness. There is no guarding.  Musculoskeletal:        General: Normal range of motion.     Cervical back: Neck supple.  Lymphadenopathy:     Cervical: No cervical adenopathy.  Skin:    General: Skin is warm and dry.  Neurological:     Mental Status: She is alert and oriented  to person, place, and time.  Psychiatric:        Mood and Affect: Mood normal.        Behavior: Behavior normal.        Thought Content: Thought content normal.        Judgment: Judgment normal.   BP 124/76 (BP Location: Left Arm, Patient Position: Sitting, Cuff Size: Small)   Pulse 64   Temp 98.3 F (36.8 C) (Oral)   Resp 16   Ht 4\' 11"  (1.499 m)   Wt 79 lb 12.8 oz (36.2 kg)   SpO2 100%   BMI  16.12 kg/m  Wt Readings from Last 3 Encounters:  11/27/22 79 lb 12.8 oz (36.2 kg)  11/13/22 79 lb 4 oz (35.9 kg)  11/10/22 80 lb (36.3 kg)    Diabetic Foot Exam - Simple   No data filed    Lab Results  Component Value Date   WBC 5.5 11/27/2022   HGB 10.3 (L) 11/27/2022   HCT 31.3 (L) 11/27/2022   PLT 254.0 11/27/2022   GLUCOSE 97 11/27/2022   CHOL 166 11/27/2022   TRIG 119.0 11/27/2022   HDL 41.50 11/27/2022   LDLDIRECT 72.1 01/19/2009   LDLCALC 101 (H) 11/27/2022   ALT 6 11/27/2022   AST 14 11/27/2022   NA 139 11/27/2022   K 4.4 11/27/2022   CL 101 11/27/2022   CREATININE 0.87 11/27/2022   BUN 28 (H) 11/27/2022   CO2 31 11/27/2022   TSH 7.05 (H) 11/27/2022   INR 1.3 (H) 09/19/2021   HGBA1C 4.9 11/27/2022    Lab Results  Component Value Date   TSH 7.05 (H) 11/27/2022   Lab Results  Component Value Date   WBC 5.5 11/27/2022   HGB 10.3 (L) 11/27/2022   HCT 31.3 (L) 11/27/2022   MCV 93.7 11/27/2022   PLT 254.0 11/27/2022   Lab Results  Component Value Date   NA 139 11/27/2022   K 4.4 11/27/2022   CO2 31 11/27/2022   GLUCOSE 97 11/27/2022   BUN 28 (H) 11/27/2022   CREATININE 0.87 11/27/2022   BILITOT 0.5 11/27/2022   ALKPHOS 11 (L) 11/27/2022   AST 14 11/27/2022   ALT 6 11/27/2022   PROT 6.8 11/27/2022   ALBUMIN 3.7 11/27/2022   CALCIUM 9.1 11/27/2022   ANIONGAP 9 11/10/2022   EGFR 41 (L) 04/11/2021   GFR 56.06 (L) 11/27/2022   Lab Results  Component Value Date   CHOL 166 11/27/2022   Lab Results  Component Value Date   HDL 41.50 11/27/2022   Lab Results  Component Value Date   LDLCALC 101 (H) 11/27/2022   Lab Results  Component Value Date   TRIG 119.0 11/27/2022   Lab Results  Component Value Date   CHOLHDL 4 11/27/2022   Lab Results  Component Value Date   HGBA1C 4.9 11/27/2022       Assessment & Plan:  Hyperglycemia Assessment & Plan: hgba1c acceptable, minimize simple carbs. Increase exercise as tolerated.   Orders: -      Comprehensive metabolic panel -     Hemoglobin A1c  Hyperlipidemia, mixed Assessment & Plan: Encourage heart healthy diet such as MIND or DASH diet, increase exercise, avoid trans fats, simple carbohydrates and processed foods, consider a krill or fish or flaxseed oil cap daily.   Orders: -     Lipid panel  Acquired hypothyroidism Assessment & Plan: On Levothyroxine, continue to monitor    Persistent atrial fibrillation [facility]) Assessment & Plan: Rate  controlled, tolerating current meds. Follows with cardiology  Orders: -     TSH  Vitamin B12 deficiency Assessment & Plan: Supplement and monitor   Orders: -     Vitamin B12  Vitamin D deficiency Assessment & Plan: Supplement and monitor   Orders: -     VITAMIN D 25 Hydroxy (Vit-D Deficiency, Fractures)  Diverticulitis -     High sensitivity CRP -     CBC with Differential/Platelet  Need for influenza vaccination -     Flu Vaccine Trivalent High Dose (Fluad)  Other orders -     Amoxicillin-Pot Clavulanate; Take 1 tablet by mouth 2 (two) times daily.  Dispense: 14 tablet; Refill: 0    Assessment and Plan    Diverticulitis Recent ER visit on 11/10/2022 for diverticulitis. Patient reports persistent left-sided abdominal pain, particularly when lying down, and intermittent diarrhea. No fever or blood in stool. Pain is improving slowly but still present. -Order basic blood work to assess inflammation markers and other parameters. -Consider a repeat course of Augmentin if symptoms do not continue to improve or if she worsens. A 7-day course has been prescribed and is available at the patient's pharmacy, but should only be taken if necessary.  General Health Maintenance Recent iron infusion due to low levels. Patient has received flu shot and COVID-19 vaccine. Discussed potential for COVID-19 booster and new RSV vaccine (Orexvy). -Encourage patient to consider COVID-19 booster and RSV vaccine once feeling  better. -Encourage patient to maintain hydration (60-80 ounces of fluids daily) and protein intake.  Follow-up No current follow-up appointment scheduled. -Schedule follow-up appointment for 2025. -Advise patient to contact office if symptoms worsen or do not continue to improve.         Danise Edge, MD

## 2022-12-05 ENCOUNTER — Inpatient Hospital Stay: Payer: Medicare Other | Attending: Hematology & Oncology

## 2022-12-05 VITALS — BP 118/56 | HR 72 | Temp 97.6°F | Resp 18

## 2022-12-05 DIAGNOSIS — E611 Iron deficiency: Secondary | ICD-10-CM | POA: Diagnosis not present

## 2022-12-05 DIAGNOSIS — D631 Anemia in chronic kidney disease: Secondary | ICD-10-CM | POA: Insufficient documentation

## 2022-12-05 DIAGNOSIS — Z7901 Long term (current) use of anticoagulants: Secondary | ICD-10-CM

## 2022-12-05 DIAGNOSIS — N1831 Chronic kidney disease, stage 3a: Secondary | ICD-10-CM | POA: Diagnosis not present

## 2022-12-05 MED ORDER — SODIUM CHLORIDE 0.9 % IV SOLN
INTRAVENOUS | Status: DC
Start: 2022-12-05 — End: 2022-12-05

## 2022-12-05 MED ORDER — IRON SUCROSE 20 MG/ML IV SOLN
200.0000 mg | Freq: Once | INTRAVENOUS | Status: AC
  Administered 2022-12-05: 200 mg via INTRAVENOUS
  Filled 2022-12-05: qty 10

## 2022-12-05 NOTE — Patient Instructions (Signed)
[facility] - A DEPT OF [facility]  Discharge Instructions: Thank you for choosing [facility] to provide your oncology and hematology care.   If you have a lab appointment with the Cancer Center, please go directly to the Cancer Center and check in at the registration area.  Wear comfortable clothing and clothing appropriate for easy access to any Portacath or PICC line.   We strive to give you quality time with your provider. You may need to reschedule your appointment if you arrive late (15 or more minutes).  Arriving late affects you and other patients whose appointments are after yours.  Also, if you miss three or more appointments without notifying the office, you may be dismissed from the clinic at the provider's discretion.      For prescription refill requests, have your pharmacy contact our office and allow 72 hours for refills to be completed.    Today you received the following agents iron.      To help prevent nausea and vomiting after your treatment, we encourage you to take your nausea medication as directed.  BELOW ARE SYMPTOMS THAT SHOULD BE REPORTED IMMEDIATELY: *FEVER GREATER THAN 100.4 F (38 C) OR HIGHER *CHILLS OR SWEATING *NAUSEA AND VOMITING THAT IS NOT CONTROLLED WITH YOUR NAUSEA MEDICATION *UNUSUAL SHORTNESS OF BREATH *UNUSUAL BRUISING OR BLEEDING *URINARY PROBLEMS (pain or burning when urinating, or frequent urination) *BOWEL PROBLEMS (unusual diarrhea, constipation, pain near the anus) TENDERNESS IN MOUTH AND THROAT WITH OR WITHOUT PRESENCE OF ULCERS (sore throat, sores in mouth, or a toothache) UNUSUAL RASH, SWELLING OR PAIN  UNUSUAL VAGINAL DISCHARGE OR ITCHING   Items with * indicate a potential emergency and should be followed up as soon as possible or go to the Emergency Department if any problems should occur.  Please show the CHEMOTHERAPY ALERT CARD or IMMUNOTHERAPY ALERT CARD at check-in to the Emergency  Department and triage nurse. Should you have questions after your visit or need to cancel or reschedule your appointment, please contact [facility] - A DEPT OF [facility]  [phone] and follow the prompts.  Office hours are 8:00 a.m. to 4:30 p.m. Monday - Friday. Please note that voicemails left after 4:00 p.m. may not be returned until the following business day.  We are closed weekends and major holidays. You have access to a nurse at all times for urgent questions. Please call the main number to the clinic [phone] and follow the prompts.  For any non-urgent questions, you may also contact your provider using MyChart. We now offer e-Visits for anyone 50 and older to request care online for non-urgent symptoms. For details visit [email or web address].   Also download the MyChart app! Go to the app store, search "MyChart", open the app, select [place], and log in with your MyChart username and password.

## 2022-12-06 ENCOUNTER — Encounter: Payer: Self-pay | Admitting: Family Medicine

## 2022-12-07 ENCOUNTER — Other Ambulatory Visit: Payer: Self-pay | Admitting: Family

## 2022-12-07 ENCOUNTER — Encounter: Payer: Self-pay | Admitting: Family Medicine

## 2022-12-07 ENCOUNTER — Telehealth: Payer: Self-pay | Admitting: Family Medicine

## 2022-12-07 MED ORDER — FLUCONAZOLE 150 MG PO TABS: 150.0000 mg | ORAL_TABLET | Freq: Every day | ORAL | 0 refills | Status: DC

## 2022-12-07 NOTE — Telephone Encounter (Signed)
Harriett Sine (daughter DPR Not Ok) called stating pt is having an issue with a yeast infection after taking the antibiotic she was prescribed. Advised a note would be sent back to advise PCP of this.

## 2022-12-07 NOTE — Telephone Encounter (Signed)
Error

## 2022-12-12 ENCOUNTER — Inpatient Hospital Stay: Payer: Medicare Other

## 2022-12-12 VITALS — BP 110/72 | HR 70 | Temp 97.7°F | Resp 18

## 2022-12-12 DIAGNOSIS — N1831 Chronic kidney disease, stage 3a: Secondary | ICD-10-CM | POA: Diagnosis not present

## 2022-12-12 DIAGNOSIS — Z7901 Long term (current) use of anticoagulants: Secondary | ICD-10-CM

## 2022-12-12 DIAGNOSIS — E611 Iron deficiency: Secondary | ICD-10-CM | POA: Diagnosis not present

## 2022-12-12 DIAGNOSIS — D631 Anemia in chronic kidney disease: Secondary | ICD-10-CM | POA: Diagnosis not present

## 2022-12-12 MED ORDER — SODIUM CHLORIDE 0.9 % IV SOLN: Freq: Once | INTRAVENOUS | Status: AC

## 2022-12-12 MED ORDER — IRON SUCROSE 20 MG/ML IV SOLN
200.0000 mg | Freq: Once | INTRAVENOUS | Status: AC
  Administered 2022-12-12: 200 mg via INTRAVENOUS
  Filled 2022-12-12: qty 10

## 2022-12-12 NOTE — Progress Notes (Signed)
1310-Pt refused to stay for 30 minute post iron infusion.  Pt without complaints at time of discharge.

## 2022-12-12 NOTE — Patient Instructions (Signed)
Iron Sucrose Injection What is this medication? IRON SUCROSE (EYE ern SOO krose) treats low levels of iron (iron deficiency anemia) in people with kidney disease. Iron is a mineral that plays an important role in making red blood cells, which carry oxygen from your lungs to the rest of your body. This medicine may be used for other purposes; ask your health care provider or pharmacist if you have questions. COMMON BRAND NAME(S): Venofer What should I tell my care team before I take this medication? They need to know if you have any of these conditions: Anemia not caused by low iron levels Heart disease High levels of iron in the blood Kidney disease Liver disease An unusual or allergic reaction to iron, other medications, foods, dyes, or preservatives Pregnant or trying to get pregnant Breastfeeding How should I use this medication? This medication is for infusion into a vein. It is given in a hospital or clinic setting. Talk to your care team about the use of this medication in children. While this medication may be prescribed for children as young as 2 years for selected conditions, precautions do apply. Overdosage: If you think you have taken too much of this medicine contact a poison control center or emergency room at once. NOTE: This medicine is only for you. Do not share this medicine with others. What if I miss a dose? Keep appointments for follow-up doses. It is important not to miss your dose. Call your care team if you are unable to keep an appointment. What may interact with this medication? Do not take this medication with any of the following: Deferoxamine Dimercaprol Other iron products This medication may also interact with the following: Chloramphenicol Deferasirox This list may not describe all possible interactions. Give your health care provider a list of all the medicines, herbs, non-prescription drugs, or dietary supplements you use. Also tell them if you smoke,  drink alcohol, or use illegal drugs. Some items may interact with your medicine. What should I watch for while using this medication? Visit your care team regularly. Tell your care team if your symptoms do not start to get better or if they get worse. You may need blood work done while you are taking this medication. You may need to follow a special diet. Talk to your care team. Foods that contain iron include: whole grains/cereals, dried fruits, beans, or peas, leafy green vegetables, and organ meats (liver, kidney). What side effects may I notice from receiving this medication? Side effects that you should report to your care team as soon as possible: Allergic reactions--skin rash, itching, hives, swelling of the face, lips, tongue, or throat Low blood pressure--dizziness, feeling faint or lightheaded, blurry vision Shortness of breath Side effects that usually do not require medical attention (report to your care team if they continue or are bothersome): Flushing Headache Joint pain Muscle pain Nausea Pain, redness, or irritation at injection site This list may not describe all possible side effects. Call your doctor for medical advice about side effects. You may report side effects to FDA at 1-800-FDA-1088. Where should I keep my medication? This medication is given in a hospital or clinic. It will not be stored at home. NOTE: This sheet is a summary. It may not cover all possible information. If you have questions about this medicine, talk to your doctor, pharmacist, or health care provider.  2024 Elsevier/Gold Standard (2022-05-25 00:00:00)

## 2022-12-18 ENCOUNTER — Other Ambulatory Visit: Payer: Self-pay

## 2022-12-18 ENCOUNTER — Inpatient Hospital Stay: Payer: Medicare Other | Admitting: Medical Oncology

## 2022-12-18 ENCOUNTER — Inpatient Hospital Stay: Payer: Medicare Other

## 2022-12-18 ENCOUNTER — Encounter: Payer: Self-pay | Admitting: Medical Oncology

## 2022-12-18 VITALS — BP 103/40 | HR 67 | Temp 97.6°F | Resp 16 | Ht 59.0 in | Wt 76.0 lb

## 2022-12-18 DIAGNOSIS — Z7901 Long term (current) use of anticoagulants: Secondary | ICD-10-CM | POA: Diagnosis not present

## 2022-12-18 DIAGNOSIS — R634 Abnormal weight loss: Secondary | ICD-10-CM

## 2022-12-18 DIAGNOSIS — D500 Iron deficiency anemia secondary to blood loss (chronic): Secondary | ICD-10-CM

## 2022-12-18 DIAGNOSIS — E611 Iron deficiency: Secondary | ICD-10-CM | POA: Diagnosis not present

## 2022-12-18 DIAGNOSIS — R54 Age-related physical debility: Secondary | ICD-10-CM

## 2022-12-18 DIAGNOSIS — D631 Anemia in chronic kidney disease: Secondary | ICD-10-CM

## 2022-12-18 DIAGNOSIS — R627 Adult failure to thrive: Secondary | ICD-10-CM

## 2022-12-18 DIAGNOSIS — N1831 Chronic kidney disease, stage 3a: Secondary | ICD-10-CM | POA: Diagnosis not present

## 2022-12-18 LAB — CBC WITH DIFFERENTIAL (CANCER CENTER ONLY)
Abs Immature Granulocytes: 0.03 10*3/uL (ref 0.00–0.07)
Basophils Absolute: 0 10*3/uL (ref 0.0–0.1)
Basophils Relative: 1 %
Eosinophils Absolute: 0 10*3/uL (ref 0.0–0.5)
Eosinophils Relative: 0 %
HCT: 29.5 % — ABNORMAL LOW (ref 36.0–46.0)
Hemoglobin: 9.1 g/dL — ABNORMAL LOW (ref 12.0–15.0)
Immature Granulocytes: 0 %
Lymphocytes Relative: 16 %
Lymphs Abs: 1.3 10*3/uL (ref 0.7–4.0)
MCH: 30.1 pg (ref 26.0–34.0)
MCHC: 30.8 g/dL (ref 30.0–36.0)
MCV: 97.7 fL (ref 80.0–100.0)
Monocytes Absolute: 0.4 10*3/uL (ref 0.1–1.0)
Monocytes Relative: 5 %
Neutro Abs: 6.1 10*3/uL (ref 1.7–7.7)
Neutrophils Relative %: 78 %
Platelet Count: 230 10*3/uL (ref 150–400)
RBC: 3.02 MIL/uL — ABNORMAL LOW (ref 3.87–5.11)
RDW: 17.6 % — ABNORMAL HIGH (ref 11.5–15.5)
WBC Count: 7.8 10*3/uL (ref 4.0–10.5)
nRBC: 0 % (ref 0.0–0.2)

## 2022-12-18 MED ORDER — DARBEPOETIN ALFA 300 MCG/0.6ML IJ SOSY
300.0000 ug | PREFILLED_SYRINGE | Freq: Once | INTRAMUSCULAR | Status: AC
  Administered 2022-12-18: 300 ug via SUBCUTANEOUS
  Filled 2022-12-18: qty 0.6

## 2022-12-18 NOTE — Patient Instructions (Signed)

## 2022-12-18 NOTE — Progress Notes (Signed)
Hematology and Oncology Follow Up Visit  [name] [name] [id number] Nov 28, 1924 89 y.o. 12/18/2022   Principle Diagnosis:  Iron deficiency anemia  Erythropoietin deficiency anemia    Current Therapy:        IV iron as indicated  Aranesp 300 mcg SQ to maintain Hgb > 11   Interim History:  [name] [name] is here today with her daughter for follow-up.   Today they report that she is so so. She has continued to have decline in her appetite, energy levels and weight.  She states that she is just not hungry and that food does not taste good. Sometimes she has nausea without vomiting.  She is tired. At her last visit we discussed increasing to 2 ensure drinks per day to help with some of her nutritional deficiencies and fatigue. She has not been able to do this due to lack of appetite  No blood loss noted. No bruising or petechiae.  No fever, chills, n/v, cough, rash, dizziness, SOB, chest pain or changes in bowel or bladder habits.  Intermittent abdominal pain with rectocele unchanged from baseline.  No swelling, tenderness in her extremities.  No falls or syncope.  Appetite and hydration are stable  Wt Readings from Last 3 Encounters:  11/27/22 79 lb 12.8 oz (36.2 kg)  11/13/22 79 lb 4 oz (35.9 kg)  11/10/22 80 lb (36.3 kg)    ECOG Performance Status: 1 - Symptomatic but completely ambulatory  Medications:  Allergies as of 12/18/2022       Reactions   Amlodipine Shortness Of Breath   Darifenacin Hydrobromide Other (See Comments)   dizziness   Sulfonamide Derivatives Other (See Comments)   dizziness   Other Other (See Comments)   dizziness   Tramadol Other (See Comments)   Insomnia, anorexia        Medication List        Accurate as of December 18, 2022  2:50 PM. If you have any questions, ask your nurse or doctor.          ALPRAZolam 0.25 MG tablet Commonly known as: XANAX Take 0.5-1 tablets (0.125-0.25 mg total) by mouth 2 (two) times daily as needed for  anxiety.   amoxicillin-clavulanate 875-125 MG tablet Commonly known as: AUGMENTIN Take 1 tablet by mouth 2 (two) times daily.   Derma-Smoothe/FS Body 0.01 % Oil Generic drug: Fluocinolone Acetonide Body Apply topically.   famotidine 20 MG tablet Commonly known as: PEPCID Take 20 mg by mouth at bedtime.   fluconazole 150 MG tablet Commonly known as: Diflucan Take 1 tablet (150 mg total) by mouth daily.   furosemide 20 MG tablet Commonly known as: LASIX PLEASE SEE ATTACHED FOR DETAILED DIRECTIONS   Hemocyte-F 324-1 MG Tabs Generic drug: Ferrous Fumarate-Folic Acid Take 1 tablet by mouth daily.   hydrOXYzine 10 MG tablet Commonly known as: ATARAX Take 10 mg by mouth at bedtime as needed for itching.   isosorbide mononitrate 30 MG 24 hr tablet Commonly known as: IMDUR Take 1 tablet (30 mg total) by mouth daily.   levocetirizine 5 MG tablet Commonly known as: XYZAL Take 2.5 mg by mouth daily at 12 noon.   losartan 25 MG tablet Commonly known as: COZAAR Take 1 tablet (25 mg total) by mouth daily.   metoprolol succinate 25 MG 24 hr tablet Commonly known as: TOPROL-XL Take 1 tablet (25 mg total) by mouth daily.   ondansetron 4 MG tablet Commonly known as: ZOFRAN Take 1 tablet (4 mg total) by mouth every  6 (six) hours.   pantoprazole 40 MG tablet Commonly known as: PROTONIX TAKE 1 TABLET BY MOUTH EVERY DAY   [place]ol[place] 4 % Crea Generic drug: Fluorouracil Apply topically daily.   Vitamin D 50 MCG (2000 UT) Caps Take 2,000 Units by mouth daily at 6 (six) AM.        Allergies:  Allergies  Allergen Reactions   Amlodipine Shortness Of Breath   Darifenacin Hydrobromide Other (See Comments)    dizziness   Sulfonamide Derivatives Other (See Comments)    dizziness   Other Other (See Comments)    dizziness   Tramadol Other (See Comments)    Insomnia, anorexia    Past Medical History, Surgical history, Social history, and Family History were reviewed and  updated.  Review of Systems: All other 10 point review of systems is negative.   Physical Exam:  vitals were not taken for this visit.   Wt Readings from Last 3 Encounters:  11/27/22 79 lb 12.8 oz (36.2 kg)  11/13/22 79 lb 4 oz (35.9 kg)  11/10/22 80 lb (36.3 kg)    Ocular: Sclerae unicteric, pupils equal, round and reactive to light Ear-nose-throat: Oropharynx clear, dentition fair Lymphatic: No cervical or supraclavicular adenopathy Lungs no rales or rhonchi, good excursion bilaterally Heart regular rate and rhythm, no murmur appreciated Abd soft, nontender, positive bowel sounds MSK no focal spinal tenderness, no joint edema Neuro: non-focal, well-oriented, appropriate affect   Lab Results  Component Value Date   WBC 7.8 12/18/2022   HGB 9.1 (L) 12/18/2022   HCT 29.5 (L) 12/18/2022   MCV 97.7 12/18/2022   PLT 230 12/18/2022   Lab Results  Component Value Date   FERRITIN 68 11/06/2022   IRON 38 11/06/2022   TIBC 295 11/06/2022   UIBC 257 11/06/2022   IRONPCTSAT 13 11/06/2022   Lab Results  Component Value Date   RETICCTPCT 1.4 11/06/2022   RBC 3.02 (L) 12/18/2022   No results found for: "KPAFRELGTCHN", "LAMBDASER", "KAPLAMBRATIO" No results found for: "IGGSERUM", "IGA", "IGMSERUM" No results found for: "TOTALPROTELP", "ALBUMINELP", "A1GS", "A2GS", "BETS", "BETA2SER", "GAMS", "MSPIKE", "SPEI"   Chemistry      Component Value Date/Time   NA 139 11/27/2022 1332   NA 139 04/11/2021 1312   K 4.4 11/27/2022 1332   CL 101 11/27/2022 1332   CO2 31 11/27/2022 1332   BUN 28 (H) 11/27/2022 1332   BUN 30 04/11/2021 1312   CREATININE 0.87 11/27/2022 1332   CREATININE 1.16 (H) 11/06/2022 1418   CREATININE 1.15 (H) 10/29/2019 1358      Component Value Date/Time   CALCIUM 9.1 11/27/2022 1332   ALKPHOS 11 (L) 11/27/2022 1332   AST 14 11/27/2022 1332   AST 13 (L) 11/06/2022 1418   ALT 6 11/27/2022 1332   ALT 7 11/06/2022 1418   BILITOT 0.5 11/27/2022 1332    BILITOT 0.3 11/06/2022 1418     Impression and Plan: [name] [name] is a very pleasant 89 yo caucasian female with multifactorial anemia (IDA and erythropoietin deficiency). She appears to have failure to thrive.   I am concerned with her continued weight loss and failure to thrive. Discussed the potential for hospice care which they are interested in. My goal would be for her to be able to graduate from hospice care however with her significant worsening of symptoms and weight loss I am concerned about her. Additionally we discussed a referral to GI however at this time they do not elect to proceed forward as they would  not want to have any testing or procedures performed.   Hgb today is 9.1, ESA today Iron studies are pending.    Hospice referral placed and discussed with [facility]  RTC 3 weeks labs (CBC) and injection RTC 6 weeks APP, labs (CBC, CMP, iron, ferritin, B12, folate), Injection-[place]   Rushie Chestnut, PA-C 12/17/20242:50 PM

## 2022-12-20 ENCOUNTER — Telehealth: Payer: Self-pay | Admitting: Emergency Medicine

## 2022-12-20 ENCOUNTER — Telehealth: Payer: Self-pay

## 2022-12-20 NOTE — Telephone Encounter (Signed)
Inform pt that Clent Jacks PA works upstairs at [facility].    Copied from CRM [id number]. Topic: General - Other >> Dec 20, 2022 10:00 AM Desma Mcgregor wrote: Reason for CRM: Patient's daughter, Harriett Sine looking to speak with Clent Jacks PA, regarding the injections and the referral for hospice. There may be some issues with that. Please call back at [phone].

## 2022-12-20 NOTE — Telephone Encounter (Signed)
Called daughter back and gave [facility] the [facility] number to reach the PA Maralyn Sago

## 2022-12-20 NOTE — Telephone Encounter (Signed)
Copied from CRM [id number]. Topic: General - Other >> Dec 20, 2022 10:00 AM Desma Mcgregor wrote: Reason for CRM: Patient's daughter, Harriett Sine looking to speak with Clent Jacks PA, regarding the injections and the referral for hospice. There may be some issues with that. Please call back at [phone].

## 2022-12-24 ENCOUNTER — Other Ambulatory Visit: Payer: Self-pay

## 2022-12-24 ENCOUNTER — Emergency Department (HOSPITAL_BASED_OUTPATIENT_CLINIC_OR_DEPARTMENT_OTHER): Payer: Medicare Other

## 2022-12-24 ENCOUNTER — Encounter (HOSPITAL_BASED_OUTPATIENT_CLINIC_OR_DEPARTMENT_OTHER): Payer: Self-pay

## 2022-12-24 ENCOUNTER — Observation Stay (HOSPITAL_BASED_OUTPATIENT_CLINIC_OR_DEPARTMENT_OTHER)
Admission: EM | Admit: 2022-12-24 | Discharge: 2022-12-25 | Disposition: A | Discharge status: Home / Self Care | Payer: Medicare Other | Attending: Internal Medicine | Admitting: Internal Medicine

## 2022-12-24 DIAGNOSIS — K6389 Other specified diseases of intestine: Principal | ICD-10-CM | POA: Diagnosis present

## 2022-12-24 DIAGNOSIS — I10 Essential (primary) hypertension: Secondary | ICD-10-CM | POA: Diagnosis present

## 2022-12-24 DIAGNOSIS — D509 Iron deficiency anemia, unspecified: Secondary | ICD-10-CM | POA: Insufficient documentation

## 2022-12-24 DIAGNOSIS — Z79899 Other long term (current) drug therapy: Secondary | ICD-10-CM | POA: Insufficient documentation

## 2022-12-24 DIAGNOSIS — R531 Weakness: Secondary | ICD-10-CM | POA: Diagnosis not present

## 2022-12-24 DIAGNOSIS — I4819 Other persistent atrial fibrillation: Secondary | ICD-10-CM | POA: Insufficient documentation

## 2022-12-24 DIAGNOSIS — K5669 Other partial intestinal obstruction: Secondary | ICD-10-CM | POA: Diagnosis not present

## 2022-12-24 DIAGNOSIS — I480 Paroxysmal atrial fibrillation: Secondary | ICD-10-CM | POA: Diagnosis present

## 2022-12-24 DIAGNOSIS — Z1152 Encounter for screening for COVID-19: Secondary | ICD-10-CM | POA: Diagnosis not present

## 2022-12-24 DIAGNOSIS — D649 Anemia, unspecified: Secondary | ICD-10-CM | POA: Diagnosis present

## 2022-12-24 DIAGNOSIS — R933 Abnormal findings on diagnostic imaging of other parts of digestive tract: Secondary | ICD-10-CM | POA: Insufficient documentation

## 2022-12-24 DIAGNOSIS — N1832 Chronic kidney disease, stage 3b: Secondary | ICD-10-CM | POA: Insufficient documentation

## 2022-12-24 DIAGNOSIS — E039 Hypothyroidism, unspecified: Secondary | ICD-10-CM | POA: Diagnosis present

## 2022-12-24 DIAGNOSIS — I129 Hypertensive chronic kidney disease with stage 1 through stage 4 chronic kidney disease, or unspecified chronic kidney disease: Secondary | ICD-10-CM | POA: Diagnosis not present

## 2022-12-24 DIAGNOSIS — E782 Mixed hyperlipidemia: Secondary | ICD-10-CM | POA: Diagnosis present

## 2022-12-24 DIAGNOSIS — K769 Liver disease, unspecified: Secondary | ICD-10-CM | POA: Diagnosis not present

## 2022-12-24 DIAGNOSIS — K449 Diaphragmatic hernia without obstruction or gangrene: Secondary | ICD-10-CM | POA: Diagnosis not present

## 2022-12-24 DIAGNOSIS — E43 Unspecified severe protein-calorie malnutrition: Secondary | ICD-10-CM | POA: Insufficient documentation

## 2022-12-24 DIAGNOSIS — K219 Gastro-esophageal reflux disease without esophagitis: Secondary | ICD-10-CM | POA: Diagnosis present

## 2022-12-24 DIAGNOSIS — K5792 Diverticulitis of intestine, part unspecified, without perforation or abscess without bleeding: Secondary | ICD-10-CM | POA: Diagnosis not present

## 2022-12-24 DIAGNOSIS — K5730 Diverticulosis of large intestine without perforation or abscess without bleeding: Secondary | ICD-10-CM | POA: Insufficient documentation

## 2022-12-24 DIAGNOSIS — C186 Malignant neoplasm of descending colon: Principal | ICD-10-CM | POA: Insufficient documentation

## 2022-12-24 DIAGNOSIS — Z7409 Other reduced mobility: Secondary | ICD-10-CM | POA: Insufficient documentation

## 2022-12-24 DIAGNOSIS — R2681 Unsteadiness on feet: Secondary | ICD-10-CM | POA: Diagnosis not present

## 2022-12-24 DIAGNOSIS — R001 Bradycardia, unspecified: Secondary | ICD-10-CM | POA: Diagnosis not present

## 2022-12-24 LAB — RESP PANEL BY RT-PCR (RSV, FLU A&B, COVID)  RVPGX2
Influenza A by PCR: NEGATIVE
Influenza B by PCR: NEGATIVE
Resp Syncytial Virus by PCR: NEGATIVE
SARS Coronavirus 2 by RT PCR: NEGATIVE

## 2022-12-24 LAB — CBC
HCT: 30.4 % — ABNORMAL LOW (ref 36.0–46.0)
Hemoglobin: 9.5 g/dL — ABNORMAL LOW (ref 12.0–15.0)
MCH: 30.2 pg (ref 26.0–34.0)
MCHC: 31.3 g/dL (ref 30.0–36.0)
MCV: 96.5 fL (ref 80.0–100.0)
Platelets: 284 10*3/uL (ref 150–400)
RBC: 3.15 MIL/uL — ABNORMAL LOW (ref 3.87–5.11)
RDW: 18.5 % — ABNORMAL HIGH (ref 11.5–15.5)
WBC: 5.5 10*3/uL (ref 4.0–10.5)
nRBC: 0 % (ref 0.0–0.2)

## 2022-12-24 LAB — URINALYSIS, MICROSCOPIC (REFLEX)

## 2022-12-24 LAB — COMPREHENSIVE METABOLIC PANEL
ALT: 10 U/L (ref 0–44)
AST: 14 U/L — ABNORMAL LOW (ref 15–41)
Albumin: 2.9 g/dL — ABNORMAL LOW (ref 3.5–5.0)
Alkaline Phosphatase: 11 U/L — ABNORMAL LOW (ref 38–126)
Anion gap: 7 (ref 5–15)
BUN: 40 mg/dL — ABNORMAL HIGH (ref 8–23)
CO2: 26 mmol/L (ref 22–32)
Calcium: 8.3 mg/dL — ABNORMAL LOW (ref 8.9–10.3)
Chloride: 103 mmol/L (ref 98–111)
Creatinine, Ser: 1.27 mg/dL — ABNORMAL HIGH (ref 0.44–1.00)
GFR, Estimated: 39 mL/min — ABNORMAL LOW (ref >60)
Glucose, Bld: 95 mg/dL (ref 70–99)
Potassium: 4.2 mmol/L (ref 3.5–5.1)
Sodium: 136 mmol/L (ref 135–145)
Total Bilirubin: 0.7 mg/dL (ref <1.2)
Total Protein: 6.7 g/dL (ref 6.5–8.1)

## 2022-12-24 LAB — URINALYSIS, ROUTINE W REFLEX MICROSCOPIC
Bilirubin Urine: NEGATIVE
Glucose, UA: NEGATIVE mg/dL
Ketones, ur: NEGATIVE mg/dL
Nitrite: NEGATIVE
Protein, ur: NEGATIVE mg/dL
Specific Gravity, Urine: 1.015 (ref 1.005–1.030)
pH: 6 (ref 5.0–8.0)

## 2022-12-24 LAB — OCCULT BLOOD X 1 CARD TO LAB, STOOL: Fecal Occult Bld: NEGATIVE

## 2022-12-24 LAB — LIPASE, BLOOD: Lipase: 28 U/L (ref 11–51)

## 2022-12-24 LAB — TSH: TSH: 11.058 u[IU]/mL — ABNORMAL HIGH (ref 0.350–4.500)

## 2022-12-24 MED ORDER — IOHEXOL 300 MG/ML  SOLN
100.0000 mL | Freq: Once | INTRAMUSCULAR | Status: AC | PRN
  Administered 2022-12-24: 80 mL via INTRAVENOUS

## 2022-12-24 MED ORDER — SODIUM CHLORIDE 0.9 % IV SOLN: INTRAVENOUS | Status: AC

## 2022-12-24 MED ORDER — HYDRALAZINE HCL 20 MG/ML IJ SOLN: 5.0000 mg | Freq: Four times a day (QID) | INTRAMUSCULAR | Status: DC | PRN

## 2022-12-24 MED ORDER — MELATONIN 5 MG PO TABS: 5.0000 mg | ORAL_TABLET | Freq: Every evening | ORAL | Status: DC | PRN

## 2022-12-24 MED ORDER — MELATONIN 3 MG PO TABS
3.0000 mg | ORAL_TABLET | Freq: Every evening | ORAL | Status: DC | PRN
  Administered 2022-12-24: 3 mg via ORAL
  Filled 2022-12-24: qty 1

## 2022-12-24 MED ORDER — ACETAMINOPHEN 325 MG PO TABS: 650.0000 mg | ORAL_TABLET | Freq: Four times a day (QID) | ORAL | Status: DC | PRN

## 2022-12-24 MED ORDER — ACETAMINOPHEN 650 MG RE SUPP: 650.0000 mg | Freq: Four times a day (QID) | RECTAL | Status: DC | PRN

## 2022-12-24 NOTE — ED Triage Notes (Signed)
Pt reports to ED with complaints of increased  weakness. Reports that she doesn't have much of an appetite. States that she get Iron infusion every 2 weeks.

## 2022-12-24 NOTE — H&P (Signed)
History and Physical    [name] [name] RUE:[id number] DOB: August 06, 1924 DOA: 12/24/2022  PCP: Bradd Canary, MD  Patient coming from: [facility] EMERGENCY DEPARTMENT AT [facility]   Chief Complaint: Generalized weakness  HPI: [name] [name] is a 89 y.o. 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 intake.  She did endorse bright red blood per rectum when she wipes but unclear how long this has been going on for.  Vital signs stable.  Labs showing no leukocytosis, hemoglobin 9.5 (at baseline), creatinine 1.2 (baseline 0.8-1.2), TSH pending, COVID/influenza/RSV PCR negative. UA with negative nitrite, trace leukocytes, and microscopy showing 11-20 WBCs and many bacteria. EDP did a rectal exam and no obvious bleeding noted.  FOBT negative.  CT abdomen pelvis showing a persistent 5.8 cm masslike soft tissue density involving the sigmoid colon suspicious for colon carcinoma and colonoscopy needed for further evaluation.  No evidence of metastatic disease seen on CT abdomen pelvis. EDP consulted on-call physician for [facility].  History history provided by the patient and her daughter at bedside.  Patient is endorsing generalized weakness, poor p.o. intake, and unintentional weight loss.  Unclear how long this has been going on for.  Denies abdominal pain, nausea, vomiting, constipation, or diarrhea.  Denies any fevers or chills.  Patient reports chronic urinary frequency and urgency only at night, no recent change.  Denies dysuria.  No other complaints.  Patient states she used to be on Eliquis for her A-fib which was stopped 6 months ago due to her chronic anemia and  concern for GI bleed.  Daughter states heme-onc had recommended palliative care but she does not think this is appropriate as despite her generalized weakness patient is otherwise living independently and able to perform her ADLs.  Review of Systems:  Review of Systems  All other systems reviewed and are negative.   Past Medical History:  Diagnosis Date   Anemia    Anxiety    Arthritis    hips, knees   Current use of long term anticoagulation    Diverticulitis of colon 02/06/2015   Gait difficulty    GERD (gastroesophageal reflux disease)    Headache(784.0) 06/22/2012   Hyperlipidemia    Hypertension    IBS (irritable bowel syndrome)    Incontinence    Loss of weight 06/05/2015   Medicare annual wellness visit, subsequent 02/05/2014   Melena    Mild mitral regurgitation    Mild tricuspid regurgitation    Mitral and aortic regurgitation    Mitral valve prolapse    hx of - not seen on recent echoes   Osteopenia    Paroxysmal atrial flutter (HCC)    Pedal edema 10/01/2016   Persistent atrial fibrillation (HCC)    Personal history of colonic polyps    Renal lithiasis 06/26/2015   Sinus bradycardia    Thyroid disease    hypo   Vitamin B12 deficiency 03/16/2016   Vitamin D deficiency     Past Surgical History:  Procedure Laterality Date   BOTOX INJECTION N/A 09/03/2012   Procedure: BOTOX INJECTION;  Surgeon: Louis Meckel, MD;  Location: [facility];  Service: Endoscopy;  Laterality: N/A;   BREAST BIOPSY Left    BREAST LUMPECTOMY Right 08/18/2014   Procedure: RIGHT  BREAST LUMPECTOMY;  Surgeon: Abigail Miyamoto, MD;  Location: [facility];  Service: General;  Laterality: Right;   CARDIAC ELECTROPHYSIOLOGY MAPPING AND ABLATION     CATARACT EXTRACTION, BILATERAL     ESOPHAGOGASTRODUODENOSCOPY N/A 09/03/2012   Procedure: ESOPHAGOGASTRODUODENOSCOPY (EGD);  Surgeon: Louis Meckel, MD;  Location: [facility];  Service: Endoscopy;  Laterality: N/A;    ESOPHAGOGASTRODUODENOSCOPY (EGD) WITH PROPOFOL N/A 12/02/2018   Procedure: ESOPHAGOGASTRODUODENOSCOPY (EGD) WITH PROPOFOL;  Surgeon: Sherrilyn Rist, MD;  Location: [facility];  Service: Gastroenterology;  Laterality: N/A;   ESOPHAGOSCOPY W/ BOTOX INJECTION     I & D EXTREMITY Right 11/06/2012   Procedure: IRRIGATION AND DEBRIDEMENT EXTREMITY Right Ring Finger;  Surgeon: Tami Ribas, MD;  Location: [facility];  Service: Orthopedics;  Laterality: Right;   PARTIAL HYSTERECTOMY     ovaries left in place   skin cancer removal       reports that she has never smoked. She has never used smokeless tobacco. She reports that she does not drink alcohol and does not use drugs.  Allergies  Allergen Reactions   Amlodipine Shortness Of Breath   Darifenacin Hydrobromide Other (See Comments)    dizziness   Sulfonamide Derivatives Other (See Comments)    dizziness   Other Other (See Comments)    dizziness   Tramadol Other (See Comments)    Insomnia, anorexia    Family History  Problem Relation Age of Onset   Diabetes Mother    CVA Mother    Stroke Mother    COPD Father    Rheum arthritis Father    Allergies Daughter    COPD Daughter    Stroke Daughter    COPD Daughter        previous smoker   Stroke Maternal Grandfather    Heart disease Maternal Aunt    Heart disease Maternal Uncle    Colon cancer Neg Hx    Colon polyps Neg Hx    Esophageal cancer Neg Hx    Gallbladder disease Neg Hx    Kidney disease Neg Hx    Heart attack Neg Hx    Hypertension Neg Hx     Prior to Admission medications   Medication Sig Start Date End Date Taking? Authorizing Provider  ALPRAZolam (XANAX) 0.25 MG tablet Take 0.5-1 tablets (0.125-0.25 mg total) by mouth 2 (two) times daily as needed for anxiety. 08/22/22   Clayborne Dana, NP  betamethasone dipropionate 0.05 % cream Apply 1 Application topically 2 (two) times daily. 12/09/22   [provider]  Cholecalciferol (VITAMIN D) 2000 UNITS CAPS Take  2,000 Units by mouth daily at 6 (six) AM. 02/20/12   Bradd Canary, MD  DERMA-SMOOTHE/FS BODY 0.01 % OIL Apply topically. 09/25/22   [provider]  famotidine (PEPCID) 20 MG tablet Take 20 mg by mouth at bedtime.    [provider]  Ferrous Fumarate-Folic Acid (HEMOCYTE-F) 324-1 MG TABS Take 1 tablet by mouth daily. 09/19/21   Danford, Earl Lites, MD  furosemide (LASIX) 20 MG tablet PLEASE SEE ATTACHED FOR DETAILED DIRECTIONS 05/09/22   Meriam Sprague, MD  hydrOXYzine (ATARAX) 10 MG tablet Take 10 mg by mouth at bedtime as needed for itching. 10/15/22   [provider]  isosorbide mononitrate (IMDUR) 30 MG 24 hr tablet Take 1 tablet (30 mg total) by mouth daily. 02/12/22   Meriam Sprague, MD  levocetirizine (XYZAL) 5 MG tablet Take 2.5 mg by mouth daily at 12 noon. 08/03/21  [provider]  losartan (COZAAR) 25 MG tablet Take 1 tablet (25 mg total) by mouth daily. 08/28/22   Pricilla Riffle, MD  metoprolol succinate (TOPROL-XL) 25 MG 24 hr tablet Take 1 tablet (25 mg total) by mouth daily. 10/25/22   Bradd Canary, MD  ondansetron (ZOFRAN) 4 MG tablet Take 1 tablet (4 mg total) by mouth every 6 (six) hours. 11/10/22   Glyn Ade, MD  pantoprazole (PROTONIX) 40 MG tablet TAKE 1 TABLET BY MOUTH EVERY DAY 09/17/22   Bradd Canary, MD  TOLAK 4 % CREA Apply topically daily. 09/25/22   [provider]    Physical Exam: Vitals:   12/24/22 1214 12/24/22 1530 12/24/22 1851  BP: (!) 120/43 126/67 (!) 154/64  Pulse: 74 65 71  Resp:  17 17  Temp: (!) 97.5 F (36.4 C)  97.6 F (36.4 C)  TempSrc: Oral  Oral  SpO2: 100% 100% 98%    Physical Exam Vitals reviewed.  Constitutional:      General: She is not in acute distress.    Comments: Cachectic  HENT:     Head: Normocephalic and atraumatic.  Eyes:     Extraocular Movements: Extraocular movements intact.  Cardiovascular:     Rate and Rhythm: Normal rate and regular rhythm.      Pulses: Normal pulses.  Pulmonary:     Effort: Pulmonary effort is normal. No respiratory distress.     Breath sounds: Normal breath sounds. No wheezing or rales.  Abdominal:     General: Bowel sounds are normal. There is no distension.     Palpations: Abdomen is soft.     Tenderness: There is no abdominal tenderness. There is no guarding.  Musculoskeletal:     Cervical back: Normal range of motion.     Right lower leg: No edema.     Left lower leg: No edema.  Skin:    General: Skin is warm and dry.  Neurological:     General: No focal deficit present.     Mental Status: She is alert and oriented to person, place, and time.     Labs on Admission: I have personally reviewed following labs and imaging studies  CBC: Recent Labs  Lab 12/18/22 1431 12/24/22 1308  WBC 7.8 5.5  NEUTROABS 6.1  --   HGB 9.1* 9.5*  HCT 29.5* 30.4*  MCV 97.7 96.5  PLT 230 284   Basic Metabolic Panel: Recent Labs  Lab 12/24/22 1308  NA 136  K 4.2  CL 103  CO2 26  GLUCOSE 95  BUN 40*  CREATININE 1.27*  CALCIUM 8.3*   GFR: Estimated Creatinine Clearance: 14.1 mL/min (A) (by C-G formula based on SCr of 1.27 mg/dL (H)). Liver Function Tests: Recent Labs  Lab 12/24/22 1308  AST 14*  ALT 10  ALKPHOS 11*  BILITOT 0.7  PROT 6.7  ALBUMIN 2.9*   Recent Labs  Lab 12/24/22 1308  LIPASE 28   No results for input(s): "AMMONIA" in the last 168 hours. Coagulation Profile: No results for input(s): "INR", "PROTIME" in the last 168 hours. Cardiac Enzymes: No results for input(s): "CKTOTAL", "CKMB", "CKMBINDEX", "TROPONINI" in the last 168 hours. BNP (last 3 results) No results for input(s): "PROBNP" in the last 8760 hours. HbA1C: No results for input(s): "HGBA1C" in the last 72 hours. CBG: No results for input(s): "GLUCAP" in the last 168 hours. Lipid Profile: No results for input(s): "CHOL", "HDL", "LDLCALC", "TRIG", "CHOLHDL", "LDLDIRECT" in the last 72 hours. Thyroid  Function  Tests: No results for input(s): "TSH", "T4TOTAL", "FREET4", "T3FREE", "THYROIDAB" in the last 72 hours. Anemia Panel: No results for input(s): "VITAMINB12", "FOLATE", "FERRITIN", "TIBC", "IRON", "RETICCTPCT" in the last 72 hours. Urine analysis:    Component Value Date/Time   COLORURINE YELLOW 12/24/2022 1217   APPEARANCEUR CLOUDY (A) 12/24/2022 1217   LABSPEC 1.015 12/24/2022 1217   PHURINE 6.0 12/24/2022 1217   GLUCOSEU NEGATIVE 12/24/2022 1217   GLUCOSEU NEGATIVE 08/30/2017 1053   HGBUR TRACE (A) 12/24/2022 1217   HGBUR trace-intact 01/19/2009 0929   BILIRUBINUR NEGATIVE 12/24/2022 1217   BILIRUBINUR negative 02/01/2017 1039   KETONESUR NEGATIVE 12/24/2022 1217   PROTEINUR NEGATIVE 12/24/2022 1217   UROBILINOGEN 0.2 08/30/2017 1053   NITRITE NEGATIVE 12/24/2022 1217   LEUKOCYTESUR TRACE (A) 12/24/2022 1217    Radiological Exams on Admission: CT ABDOMEN PELVIS W CONTRAST Result Date: 12/24/2022 CLINICAL DATA:  Left lower quadrant pain and tenderness. Anorexia. Diverticulitis. EXAM: CT ABDOMEN AND PELVIS WITH CONTRAST TECHNIQUE: Multidetector CT imaging of the abdomen and pelvis was performed using the standard protocol following bolus administration of intravenous contrast. RADIATION DOSE REDUCTION: This exam was performed according to the departmental dose-optimization program which includes automated exposure control, adjustment of the mA and/or kV according to patient size and/or use of iterative reconstruction technique. CONTRAST:  80mL OMNIPAQUE IOHEXOL 300 MG/ML  SOLN COMPARISON:  11/10/2022 FINDINGS: Lower Chest: No acute findings. Hepatobiliary: 9 mm hypervascular lesion is seen in the inferior right hepatic lobe which is stable since previous study and consistent with tiny portal hepatic venous fistula. No suspicious hepatic masses identified. Gallbladder is unremarkable. No evidence of biliary ductal dilatation. Pancreas:  No mass or inflammatory changes. Spleen: Within normal  limits in size. Stable tiny sub-cm low-attenuation lesions, consistent with benign etiology. Adrenals/Urinary Tract: Stable mild bilateral renal parenchymal atrophy. No suspicious masses identified. No evidence of ureteral calculi or hydronephrosis. Stomach/Bowel: Stable small to moderate hiatal hernia. Sigmoid diverticulosis is noted, without signs of diverticulitis. Persistent masslike soft tissue density is seen involving the sigmoid colon measuring approximately 5.8 x 2.5 cm, suspicious for colon carcinoma. No evidence of bowel obstruction or other acute inflammatory process. Vascular/Lymphatic: No pathologically enlarged lymph nodes. No acute vascular findings. Reproductive: Prior hysterectomy noted. Adnexal regions are unremarkable in appearance. Other:  None. Musculoskeletal:  No suspicious bone lesions identified. IMPRESSION: Persistent 5.8 cm masslike soft tissue density involving the sigmoid colon, suspicious for colon carcinoma. Recommend further evaluation with colonoscopy. No evidence of metastatic disease. Colonic diverticulosis, without radiographic evidence of diverticulitis. Stable small to moderate hiatal hernia. Electronically Signed   By: Danae Orleans M.D.   On: 12/24/2022 16:12    EKG: Independently reviewed.  Sinus rhythm with short PR interval, baseline wander in inferior leads.  Otherwise no significant change compared to previous EKG.  Assessment and Plan  Sigmoid colon mass suspicious for colon carcinoma Patient presenting with complaints of generalized weakness, poor p.o. intake, and unintentional weight loss.  Appears cachectic.  CT abdomen pelvis showing a persistent 5.8 cm masslike soft tissue density involving the sigmoid colon suspicious for colon carcinoma and no evidence of metastatic disease seen.  Discussed CT findings with the patient and her daughter.  Patient is not sure if she wants to pursue colonoscopy for further workup but requesting to speak to GI in the morning.   EDP has consulted on-call physician for [facility].  Keep n.p.o. after midnight, gentle IV fluid hydration.  If patient decides not to pursue colonoscopy, then consult palliative  care in the morning.  Generalized weakness In the setting of problem listed above.  TSH pending.  PT eval, fall precautions.  ?UTI UA with negative nitrite, trace leukocytes, and microscopy showing 11-20 WBCs and many bacteria.  Patient is endorsing chronic urinary frequency and urgency at night but denies dysuria.  No fever or leukocytosis.  Urine culture ordered.  Multifactorial chronic anemia due to iron and erythropoietin deficiency Hemoglobin at baseline.  Paroxysmal A-fib No longer on anticoagulation due to chronic anemia.  CKD stage IIIb Creatinine close to baseline.  Hypertension SBP currently in the 150-160s.  IV hydralazine PRN.  Resume home meds after pharmacy med rec is done.  Hyperlipidemia Hypothyroidism: TSH pending. GERD Anxiety Pharmacy med rec pending.  DVT prophylaxis: SCDs Code Status: Discussed with the patient and her daughter.  Patient wants to remain full code for now. Family Communication: Daughter at bedside. Level of care: Med-Surg Admission status: It is my clinical opinion that referral for OBSERVATION is reasonable and necessary in this patient based on the above information provided. The aforementioned taken together are felt to place the patient at high risk for further clinical deterioration. However, it is anticipated that the patient may be medically stable for discharge from the hospital within 24 to 48 hours.  John Giovanni MD [facility]  If 7PM-7AM, please contact night-coverage [email or web address]  12/24/2022, 7:33 PM

## 2022-12-24 NOTE — ED Provider Notes (Signed)
[facility] Provider Note   CSN: [id number] Arrival date & time: 12/24/22  1156     History  Chief Complaint  Patient presents with   Weakness    [name] [name] is a 89 y.o. female history of hypertension, malnutrition, hypothyroidism, mitral valve regurg, anemia requiring transfusions every 2 weeks, CKD stage IIIA, pulmonary hypertension presented for weakness.  Patient is here with family.  Weakness is been going on for quite some time however patient cannot tell me for how long this has been going on for.  Patient states that last month she did get diagnosed with diverticulitis and has been taking antibiotics thinks this may be an antibiotic side effect.  Patient states she has not been eating over the past week but denies fevers, sick contacts, chest pain, shortness of breath, abdominal pain.  Patient does note that she has bright red blood per rectum when she wipes but is unsure how long this is been going on for.  Home Medications Prior to Admission medications   Medication Sig Start Date End Date Taking? Authorizing Provider  ALPRAZolam (XANAX) 0.25 MG tablet Take 0.5-1 tablets (0.125-0.25 mg total) by mouth 2 (two) times daily as needed for anxiety. 08/22/22   Clayborne Dana, NP  betamethasone dipropionate 0.05 % cream Apply 1 Application topically 2 (two) times daily. 12/09/22   [provider]  Cholecalciferol (VITAMIN D) 2000 UNITS CAPS Take 2,000 Units by mouth daily at 6 (six) AM. 02/20/12   Bradd Canary, MD  DERMA-SMOOTHE/FS BODY 0.01 % OIL Apply topically. 09/25/22   [provider]  famotidine (PEPCID) 20 MG tablet Take 20 mg by mouth at bedtime.    [provider]  Ferrous Fumarate-Folic Acid (HEMOCYTE-F) 324-1 MG TABS Take 1 tablet by mouth daily. 09/19/21   Danford, Earl Lites, MD  furosemide (LASIX) 20 MG tablet PLEASE SEE ATTACHED FOR DETAILED DIRECTIONS 05/09/22   Meriam Sprague, MD   hydrOXYzine (ATARAX) 10 MG tablet Take 10 mg by mouth at bedtime as needed for itching. 10/15/22   [provider]  isosorbide mononitrate (IMDUR) 30 MG 24 hr tablet Take 1 tablet (30 mg total) by mouth daily. 02/12/22   Meriam Sprague, MD  levocetirizine (XYZAL) 5 MG tablet Take 2.5 mg by mouth daily at 12 noon. 08/03/21   [provider]  losartan (COZAAR) 25 MG tablet Take 1 tablet (25 mg total) by mouth daily. 08/28/22   Pricilla Riffle, MD  metoprolol succinate (TOPROL-XL) 25 MG 24 hr tablet Take 1 tablet (25 mg total) by mouth daily. 10/25/22   Bradd Canary, MD  ondansetron (ZOFRAN) 4 MG tablet Take 1 tablet (4 mg total) by mouth every 6 (six) hours. 11/10/22   Glyn Ade, MD  pantoprazole (PROTONIX) 40 MG tablet TAKE 1 TABLET BY MOUTH EVERY DAY 09/17/22   Bradd Canary, MD  TOLAK 4 % CREA Apply topically daily. 09/25/22   [provider]      Allergies    Amlodipine, Darifenacin hydrobromide, Sulfonamide derivatives, Other, and Tramadol    Review of Systems   Review of Systems  Neurological:  Positive for weakness.    Physical Exam Updated Vital Signs BP 126/67   Pulse 65   Temp (!) 97.5 F (36.4 C) (Oral)   Resp 17   SpO2 100%  Physical Exam Vitals reviewed.  Constitutional:      General: She is not in acute distress.  Comments: Weak appearing  HENT:     Head: Normocephalic and atraumatic.  Eyes:     Extraocular Movements: Extraocular movements intact.     Conjunctiva/sclera: Conjunctivae normal.     Pupils: Pupils are equal, round, and reactive to light.  Cardiovascular:     Rate and Rhythm: Normal rate and regular rhythm.     Pulses: Normal pulses.     Heart sounds: Normal heart sounds.     Comments: 2+ bilateral radial/dorsalis pedis pulses with regular rate Pulmonary:     Effort: Pulmonary effort is normal. No respiratory distress.     Breath sounds: Normal breath sounds.  Abdominal:     Palpations: Abdomen is soft.      Tenderness: There is abdominal tenderness. There is guarding (Left lower quadrant). There is no rebound.  Genitourinary:    Comments: Chaperone: Gentry Roch D, Paramedic No rectal abnormalities noted No hemorrhoids noted No obvious bleeding No skin color changes or fluctuance Rectal tone intact Musculoskeletal:        General: Normal range of motion.     Cervical back: Normal range of motion and neck supple.     Comments: 5 out of 5 bilateral grip/leg extension strength  Skin:    General: Skin is warm and dry.     Capillary Refill: Capillary refill takes less than 2 seconds.  Neurological:     General: No focal deficit present.     Mental Status: She is alert and oriented to person, place, and time.     Comments: Sensation intact in all 4 limbs  Psychiatric:        Mood and Affect: Mood normal.     ED Results / Procedures / Treatments   Labs (all labs ordered are listed, but only abnormal results are displayed) Labs Reviewed  CBC - Abnormal; Notable for the following components:      Result Value   RBC 3.15 (*)    Hemoglobin 9.5 (*)    HCT 30.4 (*)    RDW 18.5 (*)    All other components within normal limits  COMPREHENSIVE METABOLIC PANEL - Abnormal; Notable for the following components:   BUN 40 (*)    Creatinine, Ser 1.27 (*)    Calcium 8.3 (*)    Albumin 2.9 (*)    AST 14 (*)    Alkaline Phosphatase 11 (*)    GFR, Estimated 39 (*)    All other components within normal limits  RESP PANEL BY RT-PCR (RSV, FLU A&B, COVID)  [id number]  OCCULT BLOOD X 1 CARD TO LAB, STOOL  LIPASE, BLOOD  URINALYSIS, ROUTINE W REFLEX MICROSCOPIC  TSH  CBG MONITORING, ED    EKG EKG Interpretation Date/Time:  Monday December 24 2022 12:24:46 EST Ventricular Rate:  72 PR Interval:  72 QRS Duration:  95 QT Interval:  392 QTC Calculation: 429 R Axis:   21  Text Interpretation: Sinus rhythm Short PR interval Anterior infarct, old Borderline T abnormalities, inferior leads Baseline  wander Otherwise no significant change Confirmed by Elayne Snare ([id number]) on 12/24/2022 12:31:11 PM  Radiology CT ABDOMEN PELVIS W CONTRAST Result Date: 12/24/2022 CLINICAL DATA:  Left lower quadrant pain and tenderness. Anorexia. Diverticulitis. EXAM: CT ABDOMEN AND PELVIS WITH CONTRAST TECHNIQUE: Multidetector CT imaging of the abdomen and pelvis was performed using the standard protocol following bolus administration of intravenous contrast. RADIATION DOSE REDUCTION: This exam was performed according to the departmental dose-optimization program which includes automated exposure control, adjustment of the mA and/or kV  according to patient size and/or use of iterative reconstruction technique. CONTRAST:  80mL OMNIPAQUE IOHEXOL 300 MG/ML  SOLN COMPARISON:  11/10/2022 FINDINGS: Lower Chest: No acute findings. Hepatobiliary: 9 mm hypervascular lesion is seen in the inferior right hepatic lobe which is stable since previous study and consistent with tiny portal hepatic venous fistula. No suspicious hepatic masses identified. Gallbladder is unremarkable. No evidence of biliary ductal dilatation. Pancreas:  No mass or inflammatory changes. Spleen: Within normal limits in size. Stable tiny sub-cm low-attenuation lesions, consistent with benign etiology. Adrenals/Urinary Tract: Stable mild bilateral renal parenchymal atrophy. No suspicious masses identified. No evidence of ureteral calculi or hydronephrosis. Stomach/Bowel: Stable small to moderate hiatal hernia. Sigmoid diverticulosis is noted, without signs of diverticulitis. Persistent masslike soft tissue density is seen involving the sigmoid colon measuring approximately 5.8 x 2.5 cm, suspicious for colon carcinoma. No evidence of bowel obstruction or other acute inflammatory process. Vascular/Lymphatic: No pathologically enlarged lymph nodes. No acute vascular findings. Reproductive: Prior hysterectomy noted. Adnexal regions are unremarkable in appearance.  Other:  None. Musculoskeletal:  No suspicious bone lesions identified. IMPRESSION: Persistent 5.8 cm masslike soft tissue density involving the sigmoid colon, suspicious for colon carcinoma. Recommend further evaluation with colonoscopy. No evidence of metastatic disease. Colonic diverticulosis, without radiographic evidence of diverticulitis. Stable small to moderate hiatal hernia. Electronically Signed   By: Danae Orleans M.D.   On: 12/24/2022 16:12    Procedures Procedures    Medications Ordered in ED Medications  iohexol (OMNIPAQUE) 300 MG/ML solution 100 mL (80 mLs Intravenous Contrast Given 12/24/22 1508)    ED Course/ Medical Decision Making/ A&P                                 Medical Decision Making Amount and/or Complexity of Data Reviewed Labs: ordered. Radiology: ordered.  Risk Prescription drug management. Decision regarding hospitalization.   Duke Salvia 89 y.o. presented today for weakness.  Working DDx that I considered at this time includes, but not limited to, anemia, electrolyte abnormalities, viral illness, pneumonia, ACS, CVA/TIA, spinal cord pathology, GBS, rhabdomyolysis, alcohol induced/drug-induced, sepsis, hypothyroidism, severe dehydration, cancer.  R/o DDx: anemia, electrolyte abnormalities, viral illness, pneumonia, ACS, CVA/TIA, spinal cord pathology, GBS, rhabdomyolysis, alcohol induced/drug-induced, sepsis, hypothyroidism, severe dehydration: These are considered less likely due to history of present illness, physical exam, labs/imaging findings  Review of prior external notes: 11/10/2022 ED provider  Unique Tests and My Interpretation:  EKG: Sinus 72 bpm, no ST elevations indicative of ischemia, no blocks noted; similar to previous CBC: Unremarkable, improved from previous CMP: Unremarkable, similar to previous CT abdomen pelvis with contrast: 5.8 cm soft tissue mass in the colon is suspicious for cancer TSH: Pending Occult card:  Negative Respiratory panel: Negative UA: Pending  Social Determinants of Health: none  Discussion with Independent Historian:  Family member  Discussion of Management of Tests:  Maceo, PA-C Milinda Pointer, MD Hospitalist  Risk: High: hospitalization or escalation of hospital-level care  Risk Stratification Score: none  Staffed with Belfi, MD  Plan: On exam patient was no acute distress stable vitals.  Exam does show tenderness to left lower quadrant with some guarding on exam.  Patient has a recent diverticulitis and so do feel CT scan would be warranted to further evaluate this.  Patient states he has not been eating well over the past few days which may also be contributing to her weakness.  Will obtain labs along  with TSH as she does have history of hypothyroidism.  On exam though does not appear to be in myxedema coma.  Patient also endorsed bright red blood per rectum which appears to be a chronic complaint upon chart review however will do rectal exam to rule out GI bleed for patient's weakness.  With a chaperone a rectal exam was conducted that was ultimately negative for any rectal abnormalities and occult negative as well.  Patient states she does have history of bright red blood per rectum and so do feel that this is most likely hemorrhoid related and with her hemoglobin being stable do not feel that this is the cause for her increased fatigue.  CT scan came back with signs concerning for possible colon cancer.  Patient saw [facility] back in November of last year in which she saw Amada Jupiter, MD.  Radiology recommends colonoscopy.  I spoke to GI on-call and they state that they will see the patient once they are admitted.  Hospitalist be consulted.  Patient stable for admission.  I spoke to the hospitalist and patient will be admitted.  Patient stable for admission.  This chart was dictated using voice recognition software.  Despite best efforts to proofread,  errors  can occur which can change the documentation meaning.         Final Clinical Impression(s) / ED Diagnoses Final diagnoses:  Colonic mass    Rx / DC Orders ED Discharge Orders     None         Remi Deter 12/24/22 1713    Rolan Bucco, MD 12/24/22 [phone]

## 2022-12-25 ENCOUNTER — Observation Stay (HOSPITAL_COMMUNITY): Payer: Medicare Other | Admitting: Anesthesiology

## 2022-12-25 ENCOUNTER — Other Ambulatory Visit: Payer: Self-pay

## 2022-12-25 ENCOUNTER — Encounter (HOSPITAL_COMMUNITY)
Admission: EM | Disposition: A | Discharge status: Home / Self Care | Payer: Self-pay | Source: Home / Self Care | Attending: Emergency Medicine

## 2022-12-25 DIAGNOSIS — R531 Weakness: Secondary | ICD-10-CM | POA: Diagnosis not present

## 2022-12-25 DIAGNOSIS — D509 Iron deficiency anemia, unspecified: Secondary | ICD-10-CM

## 2022-12-25 DIAGNOSIS — D124 Benign neoplasm of descending colon: Secondary | ICD-10-CM | POA: Diagnosis not present

## 2022-12-25 DIAGNOSIS — E039 Hypothyroidism, unspecified: Secondary | ICD-10-CM

## 2022-12-25 DIAGNOSIS — K623 Rectal prolapse: Secondary | ICD-10-CM | POA: Diagnosis not present

## 2022-12-25 DIAGNOSIS — K5730 Diverticulosis of large intestine without perforation or abscess without bleeding: Secondary | ICD-10-CM | POA: Diagnosis not present

## 2022-12-25 DIAGNOSIS — I10 Essential (primary) hypertension: Secondary | ICD-10-CM

## 2022-12-25 DIAGNOSIS — K5669 Other partial intestinal obstruction: Secondary | ICD-10-CM

## 2022-12-25 DIAGNOSIS — K6389 Other specified diseases of intestine: Secondary | ICD-10-CM | POA: Diagnosis not present

## 2022-12-25 DIAGNOSIS — C186 Malignant neoplasm of descending colon: Secondary | ICD-10-CM | POA: Diagnosis not present

## 2022-12-25 DIAGNOSIS — D649 Anemia, unspecified: Secondary | ICD-10-CM | POA: Diagnosis not present

## 2022-12-25 DIAGNOSIS — K219 Gastro-esophageal reflux disease without esophagitis: Secondary | ICD-10-CM

## 2022-12-25 HISTORY — PX: FLEXIBLE SIGMOIDOSCOPY: SHX5431

## 2022-12-25 HISTORY — PX: BIOPSY: SHX5522

## 2022-12-25 SURGERY — SIGMOIDOSCOPY, FLEXIBLE
Anesthesia: Monitor Anesthesia Care

## 2022-12-25 MED ORDER — SODIUM CHLORIDE 0.9 % IV SOLN
INTRAVENOUS | Status: DC
Start: 2022-12-25 — End: 2022-12-25

## 2022-12-25 MED ORDER — FLEET ENEMA RE ENEM
1.0000 | ENEMA | RECTAL | Status: AC
  Administered 2022-12-25 (×2): 1 via RECTAL
  Filled 2022-12-25 (×2): qty 1

## 2022-12-25 MED ORDER — EPHEDRINE SULFATE-NACL 50-0.9 MG/10ML-% IV SOSY
PREFILLED_SYRINGE | INTRAVENOUS | Status: DC | PRN
  Administered 2022-12-25 (×2): 5 mg via INTRAVENOUS

## 2022-12-25 MED ORDER — LIDOCAINE 2% (20 MG/ML) 5 ML SYRINGE
INTRAMUSCULAR | Status: DC | PRN
  Administered 2022-12-25: 80 mg via INTRAVENOUS

## 2022-12-25 MED ORDER — PROPOFOL 10 MG/ML IV BOLUS
INTRAVENOUS | Status: DC | PRN
  Administered 2022-12-25: 40 mg via INTRAVENOUS

## 2022-12-25 NOTE — Evaluation (Signed)
Physical Therapy Evaluation Patient Details Name: [name] [name] MRN: [id number] DOB: Dec 24, 1924 Today's Date: 12/25/2022  History of Present Illness  Patient is a 89 y/o female admitted 12/24/22 due to generalized weakness, FTT, rectal bleeding.  Patient s/p EGD 12/02/22 showed massive distal duodenal diverticulum.  Seen in hematology office with nausea, fatigue, wt loss, decreased appetite and referred to hospice due to not wanting to proceed with testing.  Had iron infusions 11/13, 20, 26; 12/04, 11, 17.  CT abdomen positive for persistent 5.8 cm masslike soft tissue density involving sigmoid colon, supsicious for colon carcinoma.  PMH positive for andiety, diverticulosis, HLD, HTN, CKD IIIb, a-fib, colon polyps, B12 deficiency, hypothyroid, esophageal diverticulum with achalasia s/p Botox injection.  Clinical Impression  Patient presents with decreased mobility due to generalized weakness and decreased activity tolerance.  Previously cooking her own breakfast, completing all BADL's, furniture walking in the home and using rollator when out.  Patient currently mobilizing with RW unaided, though reports difficulty completing daily tasks due to fatigue.  Initiated education on energy conservation and issued handout.  Patient will benefit from continued skilled PT during acute stay to progress activity tolerance and education.  Feel no follow up PT needed at d/c at this time.       If plan is discharge home, recommend the following: Assistance with cooking/housework;Assist for transportation;Help with stairs or ramp for entrance   Can travel by private vehicle        Equipment Recommendations None recommended by PT  Recommendations for Other Services       Functional Status Assessment Patient has had a recent decline in their functional status and demonstrates the ability to make significant improvements in function in a reasonable and predictable amount of time.     Precautions /  Restrictions Precautions Precautions: Fall      Mobility  Bed Mobility Overal bed mobility: Modified Independent                  Transfers Overall transfer level: Needs assistance Equipment used: Rolling walker (2 wheels) Transfers: Sit to/from Stand Sit to Stand: Supervision           General transfer comment: assist for IV    Ambulation/Gait Ambulation/Gait assistance: Supervision Gait Distance (Feet): 150 Feet Assistive device: Rolling walker (2 wheels) Gait Pattern/deviations: Step-through pattern, Decreased stride length       General Gait Details: mild trunk flexion, increased time on turns with RW, assist for safety & IV, no physical support needed  Stairs            Wheelchair Mobility     Tilt Bed    Modified Rankin (Stroke Patients Only)       Balance Overall balance assessment: Needs assistance   Sitting balance-Leahy Scale: Good     Standing balance support: During functional activity, No upper extremity supported Standing balance-Leahy Scale: Good Standing balance comment: moving in bathroom no device to sink and standing to wash, dry hands turn to RW without LOB or physical assist needed                             Pertinent Vitals/Pain Pain Assessment Pain Assessment: No/denies pain    Home Living Family/patient expects to be discharged to:: Private residence Living Arrangements: Children Available Help at Discharge: Family Type of Home: Apartment Home Access: Level entry       Home Layout: One level Home Equipment: Rollator (4 wheels);Shower  seat;Grab bars - tub/shower;Hand held shower head      Prior Function Prior Level of Function : Needs assist             Mobility Comments: furniture walks at home, rollator when out ADLs Comments: completes all BADL's, help for driving, does cook breakfast     Extremity/Trunk Assessment   Upper Extremity Assessment Upper Extremity Assessment:  Generalized weakness    Lower Extremity Assessment Lower Extremity Assessment: Generalized weakness    Cervical / Trunk Assessment Cervical / Trunk Assessment: Other exceptions Cervical / Trunk Exceptions: cachexia  Communication   Communication Communication: No apparent difficulties  Cognition Arousal: Alert Behavior During Therapy: WFL for tasks assessed/performed Overall Cognitive Status: Within Functional Limits for tasks assessed                                          General Comments General comments (skin integrity, edema, etc.): two daughters in the room and supportive.  Educated with handout on energy conservation techniques.  Toileted in bathroom, assisted to change to briefs from panties and pad.  Pt performed toilet hygiene independent.    Exercises     Assessment/Plan    PT Assessment Patient needs continued PT services  PT Problem List Decreased strength;Decreased activity tolerance;Decreased balance;Decreased mobility       PT Treatment Interventions Gait training;Patient/family education;Functional mobility training;Therapeutic activities;Therapeutic exercise;Balance training    PT Goals (Current goals can be found in the Care Plan section)  Acute Rehab PT Goals Patient Stated Goal: return home PT Goal Formulation: With patient/family Time For Goal Achievement: 01/08/23 Potential to Achieve Goals: Fair    Frequency Min 1X/week     Co-evaluation               AM-PAC PT "6 Clicks" Mobility  Outcome Measure Help needed turning from your back to your side while in a flat bed without using bedrails?: None Help needed moving from lying on your back to sitting on the side of a flat bed without using bedrails?: None Help needed moving to and from a bed to a chair (including a wheelchair)?: A Little Help needed standing up from a chair using your arms (e.g., wheelchair or bedside chair)?: A Little Help needed to walk in hospital  room?: A Little Help needed climbing 3-5 steps with a railing? : Total 6 Click Score: 18    End of Session Equipment Utilized During Treatment: Gait belt Activity Tolerance: Patient tolerated treatment well Patient left: in chair;with call bell/phone within reach;with chair alarm set;with family/visitor present   PT Visit Diagnosis: Muscle weakness (generalized) (M62.81);Adult, failure to thrive (R62.7)    Time: 1022-1050 PT Time Calculation (min) (ACUTE ONLY): 28 min   Charges:   PT Evaluation $PT Eval Moderate Complexity: 1 Mod PT Treatments $Gait Training: 8-22 mins PT General Charges $$ ACUTE PT VISIT: 1 Visit         Sheran Lawless, PT Acute Rehabilitation Services [phone] 12/25/2022   [name] [name] 12/25/2022, 10:59 AM

## 2022-12-25 NOTE — Transfer of Care (Signed)
Immediate Anesthesia Transfer of Care Note  Patient: [name] [name]  Procedure(s) Performed: FLEXIBLE SIGMOIDOSCOPY BIOPSY  Patient Location: PACU and Endoscopy Unit  Anesthesia Type:MAC  Level of Consciousness: oriented, drowsy, and patient cooperative  Airway & Oxygen Therapy: Patient Spontanous Breathing and Patient connected to nasal cannula oxygen  Post-op Assessment: Report given to RN and Post -op Vital signs reviewed and stable  Post vital signs: Reviewed  Last Vitals:  Vitals Value Taken Time  BP    Temp    Pulse    Resp    SpO2      Last Pain:  Vitals:   12/25/22 1231  TempSrc: Tympanic  PainSc: 0-No pain         Complications: No notable events documented.

## 2022-12-25 NOTE — TOC CM/SW Note (Signed)
Transition of Care [facility]) - Inpatient Brief Assessment   Patient Details  Name: [name] [name] MRN: [id number] Date of Birth: [birth year]  [facility] CM/SW Contact:    Gala Lewandowsky, RN Phone Number: 12/25/2022, 3:00 PM   Clinical Narrative: Patient presented for generalized weakness. Plan for home with outpatient palliative care. Office is aware to call Harriett Sine with updates. No further needs identified.    Transition of Care Asessment: Insurance and Status: Insurance coverage has been reviewed Patient has primary care physician: Yes Home environment has been reviewed: reviewed Prior/Current Home Services: No current home services Social Drivers of Health Review: SDOH reviewed no interventions necessary Readmission risk has been reviewed: Yes Transition of care needs: no transition of care needs at this time

## 2022-12-25 NOTE — Plan of Care (Signed)
  Problem: Education: Goal: Knowledge of General Education information will improve Description Including pain rating scale, medication(s)/side effects and non-pharmacologic comfort measures Outcome: Progressing   Problem: Clinical Measurements: Goal: Ability to maintain clinical measurements within normal limits will improve Outcome: Progressing   Problem: Clinical Measurements: Goal: Will remain free from infection Outcome: Progressing   

## 2022-12-25 NOTE — Op Note (Signed)
[facility] Patient Name: [name] [name] Procedure Date : 12/25/2022 MRN: [id number] Attending MD: Dub Amis. Tomasa Rand , MD, [id number] Date of Birth: 06-Aug-1924 CSN: [id number] Age: 89 Admit Type: Inpatient Procedure:                Flexible Sigmoidoscopy Indications:              Abnormal CT of the GI tract, Iron deficiency anemia Providers:                Lorin Picket E. Tomasa Rand, MD, Stephens Shire RN, RN, Alan Ripper, Technician Referring MD:              Medicines:                Monitored Anesthesia Care Complications:            No immediate complications. Estimated Blood Loss:     Estimated blood loss was minimal. Procedure:                Pre-Anesthesia Assessment:                           - Prior to the procedure, a History and Physical                            was performed, and patient medications and                            allergies were reviewed. The patient's tolerance of                            previous anesthesia was also reviewed. The risks                            and benefits of the procedure and the sedation                            options and risks were discussed with the patient.                            All questions were answered, and informed consent                            was obtained. Prior Anticoagulants: The patient has                            taken no anticoagulant or antiplatelet agents. ASA                            Grade Assessment: III - A patient with severe                            systemic disease. After reviewing the risks and  benefits, the patient was deemed in satisfactory                            condition to undergo the procedure.                           After obtaining informed consent, the scope was                            passed under direct vision. The [id number]-[id number] ([id number])                            Olympus endoscope was introduced through the anus                             and advanced to the the descending colon. The                            flexible sigmoidoscopy was accomplished without                            difficulty. The patient tolerated the procedure                            well. The quality of the bowel preparation was poor. Scope In: 1:05:14 PM Scope Out: 1:15:33 PM Total Procedure Duration: 0 hours 10 minutes 19 seconds  Findings:      The perianal exam findings include rectal prolapse per vagina.      The digital rectal exam was normal. Pertinent negatives include normal       sphincter tone and no palpable rectal lesions.      An ulcerated partially obstructing large mass was found in the       descending colon. The mass was circumferential. In addition, its       diameter measured seven mm. No bleeding was present. Biopsies were taken       with a cold forceps for histology. Estimated blood loss was minimal.      Multiple medium-mouthed and small-mouthed diverticula were found in the       sigmoid colon and descending colon.      A large amount of solid stool was found in the sigmoid colon and in the       descending colon, interfering with visualization.      The exam was otherwise normal throughout the examined colon.      The retroflexed view of the distal rectum and anal verge was normal and       showed no anal or rectal abnormalities. Impression:               - Preparation of the colon was poor.                           - Rectal prolapse per vagina found on perianal exam.                           - Malignant partially obstructing tumor in the  descending colon. Biopsied.                           - Moderate diverticulosis in the sigmoid colon and                            in the descending colon.                           - Stool in the sigmoid colon and in the descending                            colon. Moderate Sedation:      N/A Recommendation:           - Return  patient to hospital ward for ongoing care.                           - Resume previous diet.                           - Await pathology results.                           - Recommend palliative care consultation                           - Recommend daily stool softeners or MiraLax to                            keep stools soft. Adjust as needed to achieve daily                            bowel movement. Procedure Code(s):        --- Professional ---                           [phone], Sigmoidoscopy, flexible; with biopsy, single                            or multiple Diagnosis Code(s):        --- Professional ---                           C18.6, Malignant neoplasm of descending colon                           K56.690, Other partial intestinal obstruction                           D50.9, Iron deficiency anemia, unspecified                           K57.30, Diverticulosis of large intestine without                            perforation or abscess without bleeding  R93.3, Abnormal findings on diagnostic imaging of                            other parts of digestive tract CPT copyright 2022 [employer]. All rights reserved. The codes documented in this report are preliminary and upon coder review may  be revised to meet current compliance requirements. Ifrah Vest E. Tomasa Rand, MD 12/25/2022 1:30:07 PM This report has been signed electronically. Number of Addenda: 0

## 2022-12-25 NOTE — Anesthesia Postprocedure Evaluation (Signed)
Anesthesia Post Note  Patient: [name] [name]  Procedure(s) Performed: FLEXIBLE SIGMOIDOSCOPY BIOPSY     Patient location during evaluation: Endoscopy Anesthesia Type: MAC Level of consciousness: awake and alert, patient cooperative and oriented Pain management: pain level controlled Vital Signs Assessment: post-procedure vital signs reviewed and stable Respiratory status: spontaneous breathing, nonlabored ventilation and respiratory function stable Cardiovascular status: stable and blood pressure returned to baseline Postop Assessment: no apparent nausea or vomiting Anesthetic complications: no   No notable events documented.  Last Vitals:  Vitals:   12/25/22 1330 12/25/22 1340  BP: 118/61 (!) 122/53  Pulse: 96 82  Resp: 16 18  Temp:    SpO2: 99% 97%    Last Pain:  Vitals:   12/25/22 1340  TempSrc:   PainSc: 0-No pain                 Monserath Neff,E. Saleem Coccia

## 2022-12-25 NOTE — Consult Note (Signed)
Consultation  Referring Provider:   [facility] Primary Care Physician:  Bradd Canary, MD Primary Gastroenterologist:  Dr. Myrtie Neither       Reason for Consultation:   Failure to thrive, rectal bleeding with 5.8 cm masslike soft tissue density sigmoid colon seen on CT DOA: 12/24/2022         Hospital Day: 2         HPI:   [name] [name] is a 89 y.o. female with past medical history significant for anxiety, diverticulosis, hyperlipidemia, hypertension, CKD stage IIIb, atrial fibrillation, personal history of colon polyps, B12 deficiency, hypothyroidism, esophageal diverticulum with achalasia status post Botox injection IDA.   03/2006 colonoscopy with Dr. Arlyce Dice for personal history of adenomatous polyp showed diverticulosis otherwise unremarkable exam without any polyps. Patient was last seen in the office 11/2021 for anemia and weight loss 12/02/2018 EGD for dysphagia and weight loss showed massive distal duodenal diverticulum, advise low fiber diet no surgical evaluation secondary to age. Over the past 2 years she has had iron deficiency anemia and has been following with hematology for iron infusions, EPO and supportive care. Most recently seen in the office 12/17 at hematology with Clent Jacks PA which complained of nausea without vomiting fatigue, decreased appetite with continued weight loss.  Patient was referred to hospice this patient did not want to proceed with testing or endoscopic evaluation. Patient's had iron infusions 11/13, 11/20, 11/26,12/04 and 12/11 and 12/17 12/18/2022 hemoglobin 9.1  Presents to the ER at [facility] 12/23 with generalized weakness and bright red blood per rectum.  Work up notable for  Hgb 9.5, creatinine 1.2, no leukocytosis, negative COVID/respiratory panel, UA without infection.  FOBT negative. CT abdomen pelvis showed persistent 5.8 cm masslike soft tissue density involving the sigmoid colon suspicious for colon carcinoma no evidence of  metastatic disease or lymphadenopathy.  Patient with family at bedside, daughter. Provided some of the history.  Patient states she was 89 pounds currently 48 has had progressive weakness over the last several months with decreased appetite. She has a bowel movement every day or every other day will be small volume with occasional fecal incontinence, will have constant feeling of needing to have a bowel movement has had some decreased gas occasional nausea but no vomiting.  Has bright red blood with wiping otherwise denies hematochezia or melena.  Patient denies fever, chills, chest pain, shortness of breath. While patient remains weak she still enjoys doing puzzles visiting with friends, enjoys reading. Discussion with daughter and patient, she is not interested in surgery but if there was radiation or a palliative treatment that she could do for cancer she would pursue this but she does prefer quality of life. Patient was contacted by hospice per daughter yesterday morning but they states she would not be able to continue her iron injections if she were to be on hospice which patient has been getting regularly and helping quality of life.  Abnormal ED labs: Abnormal Labs Reviewed  CBC - Abnormal; Notable for the following components:      Result Value   RBC 3.15 (*)    Hemoglobin 9.5 (*)    HCT 30.4 (*)    RDW 18.5 (*)    All other components within normal limits  URINALYSIS, ROUTINE W REFLEX MICROSCOPIC - Abnormal; Notable for the following components:   APPearance CLOUDY (*)    Hgb urine dipstick TRACE (*)    Leukocytes,Ua TRACE (*)    All  other components within normal limits  TSH - Abnormal; Notable for the following components:   TSH 11.058 (*)    All other components within normal limits  COMPREHENSIVE METABOLIC PANEL - Abnormal; Notable for the following components:   BUN 40 (*)    Creatinine, Ser 1.27 (*)    Calcium 8.3 (*)    Albumin 2.9 (*)    AST 14 (*)    Alkaline  Phosphatase 11 (*)    GFR, Estimated 39 (*)    All other components within normal limits  URINALYSIS, MICROSCOPIC (REFLEX) - Abnormal; Notable for the following components:   Bacteria, UA MANY (*)    All other components within normal limits    Past Medical History:  Diagnosis Date   Anemia    Anxiety    Arthritis    hips, knees   Current use of long term anticoagulation    Diverticulitis of colon 02/06/2015   Gait difficulty    GERD (gastroesophageal reflux disease)    Headache(784.0) 06/22/2012   Hyperlipidemia    Hypertension    IBS (irritable bowel syndrome)    Incontinence    Loss of weight 06/05/2015   Medicare annual wellness visit, subsequent 02/05/2014   Melena    Mild mitral regurgitation    Mild tricuspid regurgitation    Mitral and aortic regurgitation    Mitral valve prolapse    hx of - not seen on recent echoes   Osteopenia    Paroxysmal atrial flutter (HCC)    Pedal edema 10/01/2016   Persistent atrial fibrillation (HCC)    Personal history of colonic polyps    Renal lithiasis 06/26/2015   Sinus bradycardia    Thyroid disease    hypo   Vitamin B12 deficiency 03/16/2016   Vitamin D deficiency     Surgical History:  She  has a past surgical history that includes Breast biopsy (Left); Cardiac electrophysiology mapping and ablation; Partial hysterectomy; Cataract extraction, bilateral; Esophagoscopy w/botox injection; Esophagogastroduodenoscopy (N/A, 09/03/2012); Botox injection (N/A, 09/03/2012); I & D extremity (Right, 11/06/2012); Breast lumpectomy (Right, 08/18/2014); Esophagogastroduodenoscopy (egd) with propofol (N/A, 12/02/2018); and skin cancer removal. Family History:  Her family history includes Allergies in her daughter; COPD in her daughter, daughter, and father; CVA in her mother; Diabetes in her mother; Heart disease in her maternal aunt and maternal uncle; Rheum arthritis in her father; Stroke in her daughter, maternal grandfather, and mother. Social History:    reports that she has never smoked. She has never used smokeless tobacco. She reports that she does not drink alcohol and does not use drugs.  Prior to Admission medications   Medication Sig Start Date End Date Taking? Authorizing Provider  ALPRAZolam (XANAX) 0.25 MG tablet Take 0.5-1 tablets (0.125-0.25 mg total) by mouth 2 (two) times daily as needed for anxiety. 08/22/22  Yes Hyman Hopes B, NP  betamethasone dipropionate 0.05 % cream Apply 1 Application topically 2 (two) times daily. 12/09/22  Yes [provider]  Cholecalciferol (VITAMIN D) 2000 UNITS CAPS Take 2,000 Units by mouth daily at 6 (six) AM. 02/20/12  Yes Bradd Canary, MD  famotidine (PEPCID) 20 MG tablet Take 20 mg by mouth at bedtime.   Yes [provider]  Ferrous Fumarate-Folic Acid (HEMOCYTE-F) 324-1 MG TABS Take 1 tablet by mouth daily. 09/19/21  Yes Danford, Earl Lites, MD  hydrOXYzine (ATARAX) 10 MG tablet Take 10 mg by mouth at bedtime as needed for itching. 10/15/22  Yes [provider]  isosorbide mononitrate (IMDUR)  30 MG 24 hr tablet Take 1 tablet (30 mg total) by mouth daily. 02/12/22  Yes Meriam Sprague, MD  levocetirizine (XYZAL) 5 MG tablet Take 2.5 mg by mouth daily at 12 noon. 08/03/21  Yes [provider]  losartan (COZAAR) 25 MG tablet Take 1 tablet (25 mg total) by mouth daily. 08/28/22  Yes Pricilla Riffle, MD  metoprolol succinate (TOPROL-XL) 25 MG 24 hr tablet Take 1 tablet (25 mg total) by mouth daily. 10/25/22  Yes Bradd Canary, MD  ondansetron (ZOFRAN) 4 MG tablet Take 1 tablet (4 mg total) by mouth every 6 (six) hours. 11/10/22  Yes Glyn Ade, MD  pantoprazole (PROTONIX) 40 MG tablet TAKE 1 TABLET BY MOUTH EVERY DAY 09/17/22  Yes Bradd Canary, MD    Current Facility-Administered Medications  Medication Dose Route Frequency Provider Last Rate Last Admin   0.9 %  sodium chloride infusion   Intravenous Continuous John Giovanni, MD 75 mL/hr at  12/24/22 2330 New Bag at 12/24/22 2330   acetaminophen (TYLENOL) tablet 650 mg  650 mg Oral Q6H PRN John Giovanni, MD       Or   acetaminophen (TYLENOL) suppository 650 mg  650 mg Rectal Q6H PRN John Giovanni, MD       hydrALAZINE (APRESOLINE) injection 5 mg  5 mg Intravenous Q6H PRN John Giovanni, MD       melatonin tablet 3 mg  3 mg Oral QHS PRN John Giovanni, MD   3 mg at 12/24/22 2327    Allergies as of 12/24/2022 - Review Complete 12/24/2022  Allergen Reaction Noted   Amlodipine Shortness Of Breath 08/30/2017   Darifenacin hydrobromide Other (See Comments) 02/06/2022   Sulfonamide derivatives Other (See Comments) 01/29/2007   Other Other (See Comments) 01/29/2007   Tramadol Other (See Comments) 11/13/2010    Review of Systems:    Constitutional: No weight loss, fever, chills, weakness or fatigue HEENT: Eyes: No change in vision               Ears, Nose, Throat:  No change in hearing or congestion Skin: No rash or itching Cardiovascular: No chest pain, chest pressure or palpitations   Respiratory: No SOB or cough Gastrointestinal: See HPI and otherwise negative Genitourinary: No dysuria or change in urinary frequency Neurological: No headache, dizziness or syncope Musculoskeletal: No new muscle or joint pain Hematologic: No bleeding or bruising Psychiatric: No history of depression or anxiety     Physical Exam:  Vital signs in last 24 hours: Temp:  [97.5 F (36.4 C)-98 F (36.7 C)] 98 F (36.7 C) (12/24 0700) Pulse Rate:  [65-74] 69 (12/24 0700) Resp:  [14-17] 17 (12/24 0700) BP: (120-168)/(43-67) 139/59 (12/24 0700) SpO2:  [97 %-100 %] 97 % (12/24 0700) Last BM Date :  (pta) Last BM recorded by nurses in past 5 days No data recorded  General:   Cachectic, elderly female in no acute distress Head:  Normocephalic and atraumatic. Eyes: sclerae anicteric,conjunctive pale  Heart:  regular rate and rhythm Pulm: Clear anteriorly; no  wheezing Abdomen: Positive bowel sounds, slightly distended/full lower abdomen with left lower quadrant tenderness, no rebound.  No masses. Extremities:  Without edema. Msk:  Symmetrical without gross deformities. Peripheral pulses intact.  Neurologic:  Alert and  oriented x4;  No focal deficits.  Skin:   Dry and intact without significant lesions or rashes. Psychiatric:  Cooperative. Normal mood and affect.  LAB RESULTS: Recent Labs    12/24/22 1308  WBC 5.5  HGB 9.5*  HCT 30.4*  PLT 284   BMET Recent Labs    12/24/22 1308  NA 136  K 4.2  CL 103  CO2 26  GLUCOSE 95  BUN 40*  CREATININE 1.27*  CALCIUM 8.3*   LFT Recent Labs    12/24/22 1308  PROT 6.7  ALBUMIN 2.9*  AST 14*  ALT 10  ALKPHOS 11*  BILITOT 0.7   PT/INR No results for input(s): "LABPROT", "INR" in the last 72 hours.  STUDIES: CT ABDOMEN PELVIS W CONTRAST Result Date: 12/24/2022 CLINICAL DATA:  Left lower quadrant pain and tenderness. Anorexia. Diverticulitis. EXAM: CT ABDOMEN AND PELVIS WITH CONTRAST TECHNIQUE: Multidetector CT imaging of the abdomen and pelvis was performed using the standard protocol following bolus administration of intravenous contrast. RADIATION DOSE REDUCTION: This exam was performed according to the departmental dose-optimization program which includes automated exposure control, adjustment of the mA and/or kV according to patient size and/or use of iterative reconstruction technique. CONTRAST:  80mL OMNIPAQUE IOHEXOL 300 MG/ML  SOLN COMPARISON:  11/10/2022 FINDINGS: Lower Chest: No acute findings. Hepatobiliary: 9 mm hypervascular lesion is seen in the inferior right hepatic lobe which is stable since previous study and consistent with tiny portal hepatic venous fistula. No suspicious hepatic masses identified. Gallbladder is unremarkable. No evidence of biliary ductal dilatation. Pancreas:  No mass or inflammatory changes. Spleen: Within normal limits in size. Stable tiny sub-cm  low-attenuation lesions, consistent with benign etiology. Adrenals/Urinary Tract: Stable mild bilateral renal parenchymal atrophy. No suspicious masses identified. No evidence of ureteral calculi or hydronephrosis. Stomach/Bowel: Stable small to moderate hiatal hernia. Sigmoid diverticulosis is noted, without signs of diverticulitis. Persistent masslike soft tissue density is seen involving the sigmoid colon measuring approximately 5.8 x 2.5 cm, suspicious for colon carcinoma. No evidence of bowel obstruction or other acute inflammatory process. Vascular/Lymphatic: No pathologically enlarged lymph nodes. No acute vascular findings. Reproductive: Prior hysterectomy noted. Adnexal regions are unremarkable in appearance. Other:  None. Musculoskeletal:  No suspicious bone lesions identified. IMPRESSION: Persistent 5.8 cm masslike soft tissue density involving the sigmoid colon, suspicious for colon carcinoma. Recommend further evaluation with colonoscopy. No evidence of metastatic disease. Colonic diverticulosis, without radiographic evidence of diverticulitis. Stable small to moderate hiatal hernia. Electronically Signed   By: Danae Orleans M.D.   On: 12/24/2022 16:12      Impression    89 year old female with longstanding history of A-fib no longer on DOAC, IDA with EPO deficiency following hematology outpatient with iron infusions/EPO for the last 2 years, presents to the ER with worsening p.o. intake, weakness, failure to thrive and bright red blood per rectum.  CT showed persistent 5.8 cm masslike soft tissue density sigmoid colon suspicious for colon carcinoma no metastatic disease or lymphadenopathy. FOBT negative Patient has previous history of adenomatous polyps last colonoscopy 03/2006 with Dr. Arlyce Dice showed diverticulosis otherwise no polyps removed Likely CT does represent colon cancer, discussed with patient and her daughter, she has had significant weight loss and decreased appetite with worsening  weakness over the last several weeks, she is not interested in surgery and at this point from a nutritional standpoint I do believe she would do well. -She does still enjoy visiting with friends, puzzles and would be interested in palliative treatment for colon cancer such as radiation if this was possible. -May have mild partial obstruction symptoms, consider flexible sigmoidoscopy for tissue biopsy and to evaluate for obstruction, consider radiation consult for palliative purposes.  Will discuss further with Dr. Tomasa Rand. -Keep stools  soft, MiraLAX once twice daily, low fiber diet -Continue to monitor H&H, transfuse greater than 7, continue iron infusions/EPO -Will get CEA, consider CT chest -Suggest palliative care consult for further goals of care discussion with patient and family  Failure to thrive/severe protein calorie malnutrition secondary to likely colon cancer Recent weight loss, decreased appetite Had recent referral to hospice but they declined due to being told they were unable to continue iron infusions which helps her quality of life RD consult, calorie count, Ensure  Chronic multifactorial anemia Follows hematology outpatient last iron infusion 12/17 with EPO Hemoglobin stable on admission Continue supportive care  PAF Has not been on anticoagulation  Principal Problem:   Colonic mass Active Problems:   Hypothyroidism   Hyperlipidemia, mixed   Anemia   Essential hypertension   GERD   PAF (paroxysmal atrial fibrillation) (HCC)   Generalized weakness    LOS: 0 days    Thank you for your kind consultation, we will continue to follow.   Doree Albee  12/25/2022, 9:06 AM

## 2022-12-25 NOTE — Anesthesia Preprocedure Evaluation (Addendum)
Anesthesia Evaluation  Patient identified by MRN, date of birth, ID band Patient awake    Reviewed: Allergy & Precautions, NPO status , Patient's Chart, lab work & pertinent test results, reviewed documented beta blocker date and time   History of Anesthesia Complications Negative for: history of anesthetic complications  Airway Mallampati: II  TM Distance: >3 FB Neck ROM: Full    Dental  (+) Edentulous Upper, Edentulous Lower, Lower Dentures, Upper Dentures   Pulmonary neg pulmonary ROS   breath sounds clear to auscultation       Cardiovascular hypertension, Pt. on medications and Pt. on home beta blockers (-) angina + dysrhythmias Atrial Fibrillation  Rhythm:Irregular Rate:Normal  '18 ECHO:  - Left ventricle: The cavity size was normal. There was mild    concentric LVH. Systolic function was normal. VH84% to 60%. Wall    motion was normal; there were no regional wall motion    abnormalities.  - Aortic valve: Transvalvular velocity was within the normal range.    There was no stenosis. There was no regurgitation.  - Mitral valve: Transvalvular velocity was within the normal range.    There was no evidence for stenosis. There was mild regurgitation.  - Left atrium: The atrium was moderately dilated.  - Right ventricle: Systolic function was normal.  - Atrial septum: No defect or patent foramen ovale was identified.  - Tricuspid valve: There was mild regurgitation.  - Pulmonary arteries: Systolic pressure was within the normal    range. PA peak pressure: 36 mm Hg (S).     Neuro/Psych  Headaches  Anxiety        GI/Hepatic Neg liver ROS,GERD  Medicated and Controlled,,Sigmoid mass   Endo/Other  Hypothyroidism    Renal/GU Renal InsufficiencyRenal disease     Musculoskeletal   Abdominal   Peds  Hematology  (+) Blood dyscrasia (Hb 9.5, plt 284k)   Anesthesia Other Findings   Reproductive/Obstetrics                              Anesthesia Physical Anesthesia Plan  ASA: 3  Anesthesia Plan: MAC   Post-op Pain Management: Minimal or no pain anticipated   Induction:   PONV Risk Score and Plan: 2 and Treatment may vary due to age or medical condition  Airway Management Planned: Natural Airway and Simple Face Mask  Additional Equipment: None  Intra-op Plan:   Post-operative Plan:   Informed Consent: I have reviewed the patients History and Physical, chart, labs and discussed the procedure including the risks, benefits and alternatives for the proposed anesthesia with the patient or authorized representative who has indicated his/her understanding and acceptance.     Dental advisory given  Plan Discussed with: CRNA and Surgeon  Anesthesia Plan Comments:         Anesthesia Quick Evaluation

## 2022-12-26 NOTE — Discharge Summary (Signed)
Physician Discharge Summary   Patient: [name] [name] MRN: [id number] DOB: 09-Sep-1924  Admit date:     12/24/2022  Discharge date: 12/25/2022  Discharge Physician: Kathlen Mody   PCP: Bradd Canary, MD   Recommendations at discharge:  Please follow up with GI for biopsy results.  Please follow up with cbc and bmp in one week.  Please follow up with PCP in one week.   Discharge Diagnoses: Principal Problem:   Colonic mass Active Problems:   Hypothyroidism   Essential hypertension   Hyperlipidemia, mixed   Anemia   GERD   PAF (paroxysmal atrial fibrillation) (HCC)   Generalized weakness  Resolved Problems:   * No resolved hospital problems. [facility] Course:    [name] [name] is a 89 y.o. 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 intake.  She did endorse bright red blood per rectum when she wipes but unclear how long this has been going on for.  Vital signs stable.   Assessment and Plan:   Sigmoid colon mass suspicious for colon carcinoma Patient presenting with complaints of generalized weakness, poor p.o. intake, and unintentional weight loss.  Appears cachectic.  CT abdomen pelvis showing a persistent 5.8 cm masslike soft tissue density involving the sigmoid colon suspicious for colon carcinoma and no evidence of metastatic disease seen.  Discussed CT findings with the patient and her daughter. GI consulted.  Underwent flex sigmoidoscopy, showing a mass. Biopsies done. Patient and daughter wanted to take the patient home. Recommended to follow up with GI/ PCP regarding the biopsy results.    Generalized weakness Suspect from the above.   Follow up thyroid panel with PCP.    ?UTI Please follow up urine cultures with PCP.     Consultants: gastroenterology Procedures performed: flex sigmoidoscopy  Disposition: Home Diet recommendation:  Discharge Diet Orders (From admission, onward)     Start     Ordered   12/25/22 0000  Diet - low sodium heart healthy        12/25/22 1402           Regular diet DISCHARGE MEDICATION: Allergies as of 12/25/2022       Reactions   Amlodipine Shortness Of Breath   Darifenacin Hydrobromide Other (See Comments)   dizziness   Sulfonamide Derivatives Other (See Comments)   dizziness   Other Other (See Comments)   dizziness   Tramadol Other (See Comments)   Insomnia, anorexia        Medication List     STOP taking these medications    losartan 25 MG tablet Commonly known as: COZAAR       TAKE these medications    ALPRAZolam 0.25 MG tablet Commonly known as: XANAX Take 0.5-1 tablets (0.125-0.25 mg total) by mouth 2 (two) times daily as needed for anxiety.   betamethasone dipropionate 0.05 % cream Apply 1 Application topically 2 (two) times daily.   famotidine 20 MG tablet Commonly known as: PEPCID Take 20 mg by mouth at bedtime.   Hemocyte-F 324-1 MG Tabs Generic drug: Ferrous Fumarate-Folic Acid Take 1 tablet by mouth daily.   hydrOXYzine 10 MG tablet Commonly known as: ATARAX Take 10 mg by mouth at bedtime as needed for itching.   isosorbide mononitrate 30 MG 24  hr tablet Commonly known as: IMDUR Take 1 tablet (30 mg total) by mouth daily.   levocetirizine 5 MG tablet Commonly known as: XYZAL Take 2.5 mg by mouth daily at 12 noon.   metoprolol succinate 25 MG 24 hr tablet Commonly known as: TOPROL-XL Take 1 tablet (25 mg total) by mouth daily.   ondansetron 4 MG tablet Commonly known as: ZOFRAN Take 1 tablet (4 mg total) by mouth every 6 (six) hours.   pantoprazole 40 MG tablet Commonly known as: PROTONIX TAKE 1 TABLET BY MOUTH EVERY  DAY   Vitamin D 50 MCG (2000 UT) Caps Take 2,000 Units by mouth daily at 6 (six) AM.        Follow-up Information     [facility] up.   Specialty: PALLIATIVE CARE Why: Outpatient Palliative Services-office to call with visit times. Contact information: [address] [place] [address] [phone]               Discharge Exam: Ceasar Mons Weights   12/25/22 1411  Weight: 34.3 kg   General exam: Appears calm and comfortable  Respiratory system: Clear to auscultation. Respiratory effort normal. Cardiovascular system: S1 & S2 heard, RRR.  Gastrointestinal system: Abdomen is nondistended, soft and nontender.    Condition at discharge: fair  The results of significant diagnostics from this hospitalization (including imaging, microbiology, ancillary and laboratory) are listed below for reference.   Imaging Studies: CT ABDOMEN PELVIS W CONTRAST Result Date: 12/24/2022 CLINICAL DATA:  Left lower quadrant pain and tenderness. Anorexia. Diverticulitis. EXAM: CT ABDOMEN AND PELVIS WITH CONTRAST TECHNIQUE: Multidetector CT imaging of the abdomen and pelvis was performed using the standard protocol following bolus administration of intravenous contrast. RADIATION DOSE REDUCTION: This exam was performed according to the departmental dose-optimization program which includes automated exposure control, adjustment of the mA and/or kV according to patient size and/or use of iterative reconstruction technique. CONTRAST:  80mL OMNIPAQUE IOHEXOL 300 MG/ML  SOLN COMPARISON:  11/10/2022 FINDINGS: Lower Chest: No acute findings. Hepatobiliary: 9 mm hypervascular lesion is seen in the inferior right hepatic lobe which is stable since previous study and consistent with tiny portal hepatic venous fistula. No suspicious hepatic masses identified. Gallbladder is unremarkable. No evidence of biliary ductal dilatation. Pancreas:  No mass or inflammatory changes. Spleen: Within  normal limits in size. Stable tiny sub-cm low-attenuation lesions, consistent with benign etiology. Adrenals/Urinary Tract: Stable mild bilateral renal parenchymal atrophy. No suspicious masses identified. No evidence of ureteral calculi or hydronephrosis. Stomach/Bowel: Stable small to moderate hiatal hernia. Sigmoid diverticulosis is noted, without signs of diverticulitis. Persistent masslike soft tissue density is seen involving the sigmoid colon measuring approximately 5.8 x 2.5 cm, suspicious for colon carcinoma. No evidence of bowel obstruction or other acute inflammatory process. Vascular/Lymphatic: No pathologically enlarged lymph nodes. No acute vascular findings. Reproductive: Prior hysterectomy noted. Adnexal regions are unremarkable in appearance. Other:  None. Musculoskeletal:  No suspicious bone lesions identified. IMPRESSION: Persistent 5.8 cm masslike soft tissue density involving the sigmoid colon, suspicious for colon carcinoma. Recommend further evaluation with colonoscopy. No evidence of metastatic disease. Colonic diverticulosis, without radiographic evidence of diverticulitis. Stable small to moderate hiatal hernia. Electronically Signed   By: Danae Orleans M.D.   On: 12/24/2022 16:12    Microbiology: Results for orders placed or performed during the hospital encounter of 12/24/22  Resp panel by RT-PCR (RSV, Flu A&B, Covid) Anterior Nasal Swab     Status: None   Collection Time: 12/24/22  1:13 PM  Specimen: Anterior Nasal Swab  Result Value Ref Range Status   SARS Coronavirus 2 by RT PCR NEGATIVE NEGATIVE Final    Comment: (NOTE) SARS-CoV-2 target nucleic acids are NOT DETECTED.  The SARS-CoV-2 RNA is generally detectable in upper respiratory specimens during the acute phase of infection. The lowest concentration of SARS-CoV-2 viral copies this assay can detect is 138 copies/mL. A negative result does not preclude SARS-Cov-2 infection and should not be used as the sole basis  for treatment or other patient management decisions. A negative result may occur with  improper specimen collection/handling, submission of specimen other than nasopharyngeal swab, presence of viral mutation(s) within the areas targeted by this assay, and inadequate number of viral copies(<138 copies/mL). A negative result must be combined with clinical observations, patient history, and epidemiological information. The expected result is Negative.  Fact Sheet for Patients:  BloggerCourse.com  Fact Sheet for Healthcare Providers:  SeriousBroker.it  This test is no t yet approved or cleared by the [place] FDA and  has been authorized for detection and/or diagnosis of SARS-CoV-2 by FDA under an Emergency Use Authorization (EUA). This EUA will remain  in effect (meaning this test can be used) for the duration of the COVID-19 declaration under Section 564(b)(1) of the Act, 21 U.S.C.section 360bbb-3(b)(1), unless the authorization is terminated  or revoked sooner.       Influenza A by PCR NEGATIVE NEGATIVE Final   Influenza B by PCR NEGATIVE NEGATIVE Final    Comment: (NOTE) The Xpert Xpress SARS-CoV-2/FLU/RSV plus assay is intended as an aid in the diagnosis of influenza from Nasopharyngeal swab specimens and should not be used as a sole basis for treatment. Nasal washings and aspirates are unacceptable for Xpert Xpress SARS-CoV-2/FLU/RSV testing.  Fact Sheet for Patients: BloggerCourse.com  Fact Sheet for Healthcare Providers: SeriousBroker.it  This test is not yet approved or cleared by the [place] FDA and has been authorized for detection and/or diagnosis of SARS-CoV-2 by FDA under an Emergency Use Authorization (EUA). This EUA will remain in effect (meaning this test can be used) for the duration of the COVID-19 declaration under Section 564(b)(1) of the Act, 21  U.S.C. section 360bbb-3(b)(1), unless the authorization is terminated or revoked.     Resp Syncytial Virus by PCR NEGATIVE NEGATIVE Final    Comment: (NOTE) Fact Sheet for Patients: BloggerCourse.com  Fact Sheet for Healthcare Providers: SeriousBroker.it  This test is not yet approved or cleared by the [place] FDA and has been authorized for detection and/or diagnosis of SARS-CoV-2 by FDA under an Emergency Use Authorization (EUA). This EUA will remain in effect (meaning this test can be used) for the duration of the COVID-19 declaration under Section 564(b)(1) of the Act, 21 U.S.C. section 360bbb-3(b)(1), unless the authorization is terminated or revoked.  Performed at [facility], [address] [place], [place] [address]   Urine Culture (for pregnant, neutropenic or urologic patients or patients with an indwelling urinary catheter)     Status: Abnormal (Preliminary result)   Collection Time: 12/25/22  4:59 AM   Specimen: Urine, Clean Catch  Result Value Ref Range Status   Specimen Description URINE, CLEAN CATCH  Final   Special Requests NONE  Final   Culture (A)  Final    >=100,000 COLONIES/mL ESCHERICHIA COLI SUSCEPTIBILITIES TO FOLLOW Performed at [facility], [address] [place], [place] [address]    Report Status PENDING  Incomplete   *Note: Due to a large number of  results and/or encounters for the requested time period, some results have not been displayed. A complete set of results can be found in Results Review.    Labs: CBC: Recent Labs  Lab 12/24/22 1308  WBC 5.5  HGB 9.5*  HCT 30.4*  MCV 96.5  PLT 284   Basic Metabolic Panel: Recent Labs  Lab 12/24/22 1308  NA 136  K 4.2  CL 103  CO2 26  GLUCOSE 95  BUN 40*  CREATININE 1.27*  CALCIUM 8.3*   Liver Function Tests: Recent Labs  Lab 12/24/22 1308  AST 14*  ALT 10  ALKPHOS 11*  BILITOT 0.7  PROT 6.7  ALBUMIN  2.9*   CBG: No results for input(s): "GLUCAP" in the last 168 hours.  Discharge time spent: 40 minutes.   Signed: Kathlen Mody, MD [facility]

## 2022-12-27 ENCOUNTER — Encounter (HOSPITAL_COMMUNITY): Payer: Self-pay | Admitting: Gastroenterology

## 2022-12-27 LAB — URINE CULTURE: Culture: >=100000 — AB

## 2022-12-27 LAB — SURGICAL PATHOLOGY

## 2022-12-28 ENCOUNTER — Ambulatory Visit (INDEPENDENT_AMBULATORY_CARE_PROVIDER_SITE_OTHER): Payer: Medicare Other | Admitting: Family Medicine

## 2022-12-28 ENCOUNTER — Encounter: Payer: Self-pay | Admitting: Family Medicine

## 2022-12-28 VITALS — BP 120/60 | HR 106 | Temp 97.1°F | Resp 16 | Ht 59.0 in | Wt 76.0 lb

## 2022-12-28 DIAGNOSIS — K6389 Other specified diseases of intestine: Secondary | ICD-10-CM | POA: Diagnosis not present

## 2022-12-28 NOTE — Progress Notes (Signed)
Established Patient Office Visit  Subjective   Patient ID: [name] [name], female    DOB: 05-30-1924  Age: 89 y.o. MRN: [id number]  Chief Complaint  Patient presents with   Hospitalization Follow-up    Colon mass    HPI Discussed the use of AI scribe software for clinical note transcription with the patient, who gave verbal consent to proceed.  History of Present Illness   The patient, with a history of diverticulitis, was recently hospitalized due to significant weakness. During the hospital stay, a scope was performed with a biopsy taken. The biopsy results revealed a diagnosis of cancer, which was unexpected. The patient has been experiencing some abdominal discomfort, particularly on the left side, but denies any severe pain. She has also reported some changes in bowel habits, including occasional incontinence.  The patient's appetite has been poor, leading to weight loss, but has recently improved slightly. She has been receiving injections for an unspecified condition, which are believed to be beneficial. The patient also had a urinary tract infection, for which she has been prescribed Keflex.  The patient is aware of the cancer diagnosis and has expressed a willingness to consider radiation treatment. However, the patient's overall health status and ability to tolerate such treatment is uncertain. The patient's family has been in contact with palliative care and hospice, but decisions regarding these services have been deferred pending further consultation with the oncology team.      Patient Active Problem List   Diagnosis Date Noted   Colonic mass 12/24/2022   Generalized weakness 12/24/2022   CRP elevated 05/17/2022   Elevated sed rate 05/17/2022   Hypocalcemia 05/17/2022   Hypoproteinemia (HCC) 05/17/2022   Symptomatic anemia 09/19/2021   Heme + stool 09/19/2021   Edema 09/18/2021   Muscle cramp 06/22/2020   Malnutrition (HCC) 11/09/2019   PAF (paroxysmal atrial  fibrillation) (HCC)    Esophageal dysphagia 10/29/2018   Bradycardia 06/29/2017   Pedal edema 10/01/2016   Preventative health care 03/18/2016   Vitamin B12 deficiency 03/16/2016   Renal lithiasis 06/26/2015   Loss of weight 06/05/2015   Medicare annual wellness visit, subsequent 02/05/2014   Abdominal pain, chronic, right lower quadrant 02/03/2014   BRBPR (bright red blood per rectum) 12/03/2013   Prolapse of female pelvic organs 12/03/2013   Abnormal chest x-ray 10/23/2013   Pulmonary hypertension (HCC) 10/09/2013   Tricuspid regurgitation 10/09/2013   Chronic anticoagulation 10/09/2013   Hyperglycemia 08/24/2013   Encounter for therapeutic drug monitoring 03/13/2013   Stage 3a chronic kidney disease (CKD) (HCC) 06/28/2012   Headache 06/22/2012   Sinus bradycardia 09/20/2011   Urinary frequency 02/13/2011   Arthritis 11/13/2010   Long term (current) use of anticoagulants 03/31/2010   Achalasia 05/18/2009   DYSPHAGIA UNSPECIFIED 04/11/2009   Anemia 01/19/2009   ESOPHAGEAL STRICTURE 01/19/2009   Anxiety state 09/15/2008   MITRAL REGURGITATION, MILD 08/20/2008   Persistent atrial fibrillation (HCC) 08/20/2008   MITRAL VALVE PROLAPSE, HX OF 08/20/2008   Vitamin D deficiency 01/15/2008   IBS 02/03/2007   Hypothyroidism 01/29/2007   Hyperlipidemia, mixed 01/29/2007   Essential hypertension 01/29/2007   GERD 01/29/2007   Disorder of bone and cartilage 01/29/2007   History of colonic polyps 01/29/2007   Past Medical History:  Diagnosis Date   Anemia    Anxiety    Arthritis    hips, knees   Current use of long term anticoagulation    Diverticulitis of colon 02/06/2015   Gait difficulty    GERD (gastroesophageal  reflux disease)    Headache(784.0) 06/22/2012   Hyperlipidemia    Hypertension    IBS (irritable bowel syndrome)    Incontinence    Loss of weight 06/05/2015   Medicare annual wellness visit, subsequent 02/05/2014   Melena    Mild mitral regurgitation    Mild  tricuspid regurgitation    Mitral and aortic regurgitation    Mitral valve prolapse    hx of - not seen on recent echoes   Osteopenia    Paroxysmal atrial flutter (HCC)    Pedal edema 10/01/2016   Persistent atrial fibrillation (HCC)    Personal history of colonic polyps    Renal lithiasis 06/26/2015   Sinus bradycardia    Thyroid disease    hypo   Vitamin B12 deficiency 03/16/2016   Vitamin D deficiency    Past Surgical History:  Procedure Laterality Date   BIOPSY  12/25/2022   Procedure: BIOPSY;  Surgeon: Jenel Lucks, MD;  Location: [facility] ENDOSCOPY;  Service: Gastroenterology;;   BOTOX INJECTION N/A 09/03/2012   Procedure: BOTOX INJECTION;  Surgeon: Louis Meckel, MD;  Location: [facility] ENDOSCOPY;  Service: Endoscopy;  Laterality: N/A;   BREAST BIOPSY Left    BREAST LUMPECTOMY Right 08/18/2014   Procedure: RIGHT BREAST LUMPECTOMY;  Surgeon: Abigail Miyamoto, MD;  Location: [facility];  Service: General;  Laterality: Right;   CARDIAC ELECTROPHYSIOLOGY MAPPING AND ABLATION     CATARACT EXTRACTION, BILATERAL     ESOPHAGOGASTRODUODENOSCOPY N/A 09/03/2012   Procedure: ESOPHAGOGASTRODUODENOSCOPY (EGD);  Surgeon: Louis Meckel, MD;  Location: [facility] ENDOSCOPY;  Service: Endoscopy;  Laterality: N/A;   ESOPHAGOGASTRODUODENOSCOPY (EGD) WITH PROPOFOL N/A 12/02/2018   Procedure: ESOPHAGOGASTRODUODENOSCOPY (EGD) WITH PROPOFOL;  Surgeon: Sherrilyn Rist, MD;  Location: [facility] ENDOSCOPY;  Service: Gastroenterology;  Laterality: N/A;   ESOPHAGOSCOPY W/ BOTOX INJECTION     FLEXIBLE SIGMOIDOSCOPY N/A 12/25/2022   Procedure: FLEXIBLE SIGMOIDOSCOPY;  Surgeon: Jenel Lucks, MD;  Location: [facility] ENDOSCOPY;  Service: Gastroenterology;  Laterality: N/A;   I & D EXTREMITY Right 11/06/2012   Procedure: IRRIGATION AND DEBRIDEMENT EXTREMITY Right Ring Finger;  Surgeon: Tami Ribas, MD;  Location: [facility] OR;  Service: Orthopedics;  Laterality: Right;   PARTIAL HYSTERECTOMY     ovaries left in place    skin cancer removal     Social History   Tobacco Use   Smoking status: Never   Smokeless tobacco: Never  Vaping Use   Vaping status: Never Used  Substance Use Topics   Alcohol use: No   Drug use: No   Social History   Socioeconomic History   Marital status: Widowed    Spouse name: Not on file   Number of children: 5   Years of education: Not on file   Highest education level: Not on file  Occupational History   Occupation: retired    Associate Professor: RETIRED  Tobacco Use   Smoking status: Never   Smokeless tobacco: Never  Vaping Use   Vaping status: Never Used  Substance and Sexual Activity   Alcohol use: No   Drug use: No   Sexual activity: Not Currently    Comment: lives with Daughter, grandson and his family. no dietary restricitons.  Other Topics Concern   Not on file  Social History Narrative   Not on file   Social Drivers of Health   Financial Resource Strain: Low Risk  (12/11/2021)   Overall Financial Resource Strain (CARDIA)    Difficulty of Paying Living Expenses: Not hard  at all  Food Insecurity: No Food Insecurity (12/25/2022)   Hunger Vital Sign    Worried About Running Out of Food in the Last Year: Never true    Ran Out of Food in the Last Year: Never true  Transportation Needs: No Transportation Needs (12/25/2022)   PRAPARE - Administrator, Civil Service (Medical): No    Lack of Transportation (Non-Medical): No  Physical Activity: Inactive (12/11/2021)   Exercise Vital Sign    Days of Exercise per Week: 0 days    Minutes of Exercise per Session: 0 min  Stress: No Stress Concern Present (12/11/2021)   [employer] of Occupational Health - Occupational Stress Questionnaire    Feeling of Stress : Not at all  Social Connections: Socially Isolated (12/11/2021)   Social Connection and Isolation Panel [NHANES]    Frequency of Communication with Friends and Family: Once a week    Frequency of Social Gatherings with Friends and Family:  Once a week    Attends Religious Services: Never    Database administrator or Organizations: No    Attends Banker Meetings: Never    Marital Status: Widowed  Intimate Partner Violence: Not At Risk (12/25/2022)   Humiliation, Afraid, Rape, and Kick questionnaire    Fear of Current or Ex-Partner: No    Emotionally Abused: No    Physically Abused: No    Sexually Abused: No   Family Status  Relation Name Status   Mother  Deceased at age 9       CVA, diabetes   Father  Deceased at age 36       complications of COPD, smoker   Daughter  Alive       58, [place]   Daughter  Deceased at age 21        smoker   Daughter  Alive       64   Daughter  Alive       9   MGF  Deceased at age 21s   MGM  Deceased at age 3   PGM  Deceased at age 50   PGF  Deceased   Mat Aunt  (Not Specified)   Nurse, mental health  (Not Specified)   Neg Hx  (Not Specified)  No partnership data on file   Family History  Problem Relation Age of Onset   Diabetes Mother    CVA Mother    Stroke Mother    COPD Father    Rheum arthritis Father    Allergies Daughter    COPD Daughter    Stroke Daughter    COPD Daughter        previous smoker   Stroke Maternal Grandfather    Heart disease Maternal Aunt    Heart disease Maternal Uncle    Colon cancer Neg Hx    Colon polyps Neg Hx    Esophageal cancer Neg Hx    Gallbladder disease Neg Hx    Kidney disease Neg Hx    Heart attack Neg Hx    Hypertension Neg Hx    Allergies  Allergen Reactions   Amlodipine Shortness Of Breath   Darifenacin Hydrobromide Other (See Comments)    dizziness   Sulfonamide Derivatives Other (See Comments)    dizziness   Other Other (See Comments)    dizziness   Tramadol Other (See Comments)    Insomnia, anorexia      Review of Systems  Constitutional:  Negative for fever and malaise/fatigue.  HENT:  Negative for congestion.   Eyes:  Negative for blurred vision.  Respiratory:  Negative for shortness of breath.    Cardiovascular:  Negative for chest pain, palpitations and leg swelling.  Gastrointestinal:  Negative for abdominal pain, blood in stool and nausea.  Genitourinary:  Negative for dysuria and frequency.  Musculoskeletal:  Negative for falls.  Skin:  Negative for rash.  Neurological:  Negative for dizziness, loss of consciousness and headaches.  Endo/Heme/Allergies:  Negative for environmental allergies.  Psychiatric/Behavioral:  Negative for depression. The patient is not nervous/anxious.       Objective:     BP 120/60 (BP Location: Right Arm, Patient Position: Sitting, Cuff Size: Small)   Pulse (!) 106   Temp (!) 97.1 F (36.2 C) (Oral)   Resp 16   Ht 4\' 11"  (1.499 m)   Wt 76 lb (34.5 kg)   SpO2 96%   BMI 15.35 kg/m  BP Readings from Last 3 Encounters:  12/28/22 120/60  12/25/22 (!) 122/53  12/18/22 (!) 103/40   Wt Readings from Last 3 Encounters:  12/28/22 76 lb (34.5 kg)  12/25/22 75 lb 9.9 oz (34.3 kg)  12/18/22 76 lb (34.5 kg)   SpO2 Readings from Last 3 Encounters:  12/28/22 96%  12/25/22 97%  12/18/22 98%      Physical Exam Vitals and nursing note reviewed.  Constitutional:      General: She is not in acute distress.    Appearance: Normal appearance. She is well-developed.  HENT:     Head: Normocephalic and atraumatic.  Eyes:     General: No scleral icterus.       Right eye: No discharge.        Left eye: No discharge.  Cardiovascular:     Rate and Rhythm: Normal rate and regular rhythm.     Heart sounds: No murmur heard. Pulmonary:     Effort: Pulmonary effort is normal. No respiratory distress.     Breath sounds: Normal breath sounds.  Abdominal:     Tenderness: There is abdominal tenderness.       Comments: Min tenderness L low - quad--- no rebound or guarding   Musculoskeletal:        General: Normal range of motion.     Cervical back: Normal range of motion and neck supple.     Right lower leg: No edema.     Left lower leg: No edema.   Skin:    General: Skin is warm and dry.  Neurological:     Mental Status: She is alert and oriented to person, place, and time.  Psychiatric:        Mood and Affect: Mood normal.        Behavior: Behavior normal.        Thought Content: Thought content normal.        Judgment: Judgment normal.     No results found for any visits on 12/28/22.  Last CBC Lab Results  Component Value Date   WBC 5.5 12/24/2022   HGB 9.5 (L) 12/24/2022   HCT 30.4 (L) 12/24/2022   MCV 96.5 12/24/2022   MCH 30.2 12/24/2022   RDW 18.5 (H) 12/24/2022   PLT 284 12/24/2022   Last metabolic panel Lab Results  Component Value Date   GLUCOSE 95 12/24/2022   NA 136 12/24/2022   K 4.2 12/24/2022   CL 103 12/24/2022   CO2 26 12/24/2022   BUN 40 (H) 12/24/2022   CREATININE 1.27 (H) 12/24/2022  GFRNONAA 39 (L) 12/24/2022   CALCIUM 8.3 (L) 12/24/2022   PHOS 4.4 08/24/2013   PROT 6.7 12/24/2022   ALBUMIN 2.9 (L) 12/24/2022   LABGLOB 2.9 02/10/2020   AGRATIO 1.3 02/10/2020   BILITOT 0.7 12/24/2022   ALKPHOS 11 (L) 12/24/2022   AST 14 (L) 12/24/2022   ALT 10 12/24/2022   ANIONGAP 7 12/24/2022   Last lipids Lab Results  Component Value Date   CHOL 166 11/27/2022   HDL 41.50 11/27/2022   LDLCALC 101 (H) 11/27/2022   LDLDIRECT 72.1 01/19/2009   TRIG 119.0 11/27/2022   CHOLHDL 4 11/27/2022   Last hemoglobin A1c Lab Results  Component Value Date   HGBA1C 4.9 11/27/2022   Last thyroid functions Lab Results  Component Value Date   TSH 11.058 (H) 12/24/2022   Last vitamin D Lab Results  Component Value Date   VD25OH 38.14 11/27/2022   Last vitamin B12 and Folate Lab Results  Component Value Date   VITAMINB12 255 11/27/2022      The ASCVD Risk score (Arnett DK, et al., 2019) failed to calculate for the following reasons:   The 2019 ASCVD risk score is only valid for ages 38 to 8    Assessment & Plan:   Problem List Items Addressed This Visit       Unprioritized   Colonic  mass - Primary   Relevant Orders   CBC with Differential/Platelet   Basic metabolic panel   Assessment and Plan    Colon Cancer   Diagnosed with colon cancer via biopsy after a short scope procedure, she is currently asymptomatic and reports an improved appetite. She is considering palliative care and hospice but expresses concerns about discontinuing beneficial injections and is uncertain about her tolerance for radiation therapy. We discussed the benefits of hospice for pain control and transition support and are seeking an oncologist's opinion on treatment options and the feasibility of radiation. We will message oncologist Dr. Mercer Pod and Maralyn Sago to discuss next steps and treatment options, schedule an earlier appointment with the oncologist to discuss the diagnosis and treatment options, and hold off on palliative care until after the oncologist consultation.  Urinary Tract Infection   She has a urinary tract infection confirmed by urine culture, for which we prescribed Keflex (cephalexin). We will start Keflex as prescribed and monitor for adverse reactions, reporting any issues.  General Health Maintenance   She has gained two pounds recently with improved dietary intake and reports no current pain or other issues. We will monitor her weight and dietary intake, continue using Depends for incontinence, and repeat blood work as requested by the hospital.  Follow-up   We will follow up with the oncologist and gastroenterologist for colon cancer management, ensure palliative care contacts early next week if needed, and advise her to call the clinic with any new questions or concerns.       Return if symptoms worsen or fail to improve.    Donato Schultz, DO

## 2022-12-28 NOTE — Patient Instructions (Signed)
Colorectal Cancer  Colorectal cancer is a cancerous (malignant) tumor in the colon or rectum, which are parts of the large intestine. A tumor is a mass of cells or tissue. The cancer can spread (metastasize) to other parts of the body. What are the causes? This condition is usually caused by abnormal growths called polyps on the inner wall of the colon or rectum. Left untreated, these polyps can develop into cancer. Other times, abnormal changes to genes (gene mutations) can cause cells to become cancerous. What increases the risk? The following factors may make you more likely to develop this condition: Being older than age 31. Having a personal or family history of colorectal cancer or polyps in your colon. Having diabetes, or having had cancer and cancer treatments such as radiation before. Having certain hereditary conditions, such as: Lynch syndrome. Familial adenomatous polyposis. Turcot syndrome. Peutz-Jeghers syndrome. MUTYH-associated polyposis (MAP). Being overweight or obese. Having a diet that is: High in red meats, such as beef, pork, lamb, or liver. High in precooked, cured, or other processed meat, such as sausages, meat loaves, and hot dogs. Low in fiber, such as fiber found in whole grains, fruits, and vegetables. Being inactive (sedentary), smoking, or drinking too much alcohol. Having an inflammatory bowel disease, such as ulcerative colitis or Crohn's disease. What are the signs or symptoms? Early colorectal cancer often does not cause symptoms. As the cancer grows, symptoms may include: Changes in bowel habits. Feeling like the bowel does not empty completely after a bowel movement. Stools (feces) that are narrower than usual, or blood in the stool or toilet after a bowel movement. The blood may be bright red or very dark in color. Diarrhea, constipation, or frequent gas pain. Anemia, constant tiredness (fatigue), or nausea and vomiting. Discomfort, pain, bloating,  fullness, or cramps in the abdomen. Unexplained weight loss. How is this diagnosed? This condition may be diagnosed with: A medical history. A physical exam. Tests. These may include: An exam of the rectum using a gloved finger (digital rectal exam). A stool test called a fecal occult blood test. Blood tests. A biopsy. This is removal of a tissue sample from the colon or rectum to be looked at under a microscope. You may also have other tests, including: X-rays, CT scans, MRIs, or a PET scan. A sigmoidoscopy. This test is done to view the inside of the rectum. A colonoscopy. This test is done to view the inside of the colon. During this test, small polyps can be removed or biopsies may be taken. An endorectal ultrasound. This test checks how deep a tumor in the rectum has grown and whether the cancer has spread to lymph nodes or other nearby tissues. Additional tests may be done to find out whether the cancer has spread to other parts of the body (what stage it is). The stages of cancer include: Stage 0 - At this stage, the cancer is found only in the innermost lining of the colon or rectum. The tumor has not spread to other tissue. Stage 1 (I) - At this stage, the cancer has grown into the inner wall (muscle layer) of the colon or rectum. Stage 2 (II) - At this stage, the cancer has grown more deeply into the wall of the colon or rectum or through the wall. It may have invaded nearby tissue or organs. Stage 3 (III) - At this stage, the cancer has spread to nearby lymph nodes or tissue near the lymph nodes. Stage 4 (IV) - At this  stage, the cancer has spread to other parts of the body that are not near the colon, such as the liver or lungs. How is this treated? Treatment for this condition depends on the type and stage of the cancer. Treatment may include: Surgery. In the early stages of the cancer, surgery may be done to remove polyps or small tumors from the colon. In later stages, surgery  may be done to remove part of the colon (partial colectomy). Chemotherapy. This treatment uses medicines to kill cancer cells. Targeted therapy. This treatment can kill tumor cells by targeting specific gene mutations or proteins that the cancer expresses. Immunotherapy (biologic therapy). This treatment uses your body's disease-fighting system (immune system) to fight the cancer. Substances made by your body or in a laboratory are used to boost, direct, or restore your body's natural defenses against cancer. Radiation therapy. This treatment uses radiation to kill cancer cells or shrink tumors. Radiofrequency ablation. This treatment uses radio waves to destroy the tumors that may have spread to other areas of the body, such as the liver. Follow these instructions at home: Take over-the-counter and prescription medicines only as told by your health care provider. Try to eat regular, healthy meals. Some of your treatments might affect your appetite. Ask to meet with a dietitian if you are having problems eating or with your appetite. Consider joining a support group. This may help you learn about your diagnosis and manage the stress of having colorectal cancer. If you are admitted to the hospital, tell your cancer care team. Keep all follow-up visits. This is important. How is this prevented? Colorectal cancer can be prevented with screening tests that find polyps so they can be removed before they develop into cancer. All adults should have screening for colorectal cancer starting at age 1 and continuing until age 70. Your health care provider may recommend screening before age 65. People at increased risk should start screening at an earlier age. You may be able to help reduce your risk of developing colorectal cancer by staying at a healthy weight, eating a healthy diet, avoiding tobacco and alcohol use, and being physically active. Where to find more information American Cancer Society:  cancer.org [employer] (NCI): cancer.gov Contact a health care provider if: Your diarrhea or constipation does not go away. You have blood in your stool or in the toilet after a bowel movement. Your bowel habits change. You have increased pain in your abdomen. You notice new fatigue or weakness. You lose weight without a known reason. Get help right away if: You have increased bleeding from the rectum. You have any uncontrollable or severe abdominal symptoms. Summary Colorectal cancer is a cancerous (malignant) tumor in the colon or rectum, which are parts of the large intestine. Common risk factors for this condition include being older than age 63, having a personal or family history of colorectal cancer or colon polyps, having certain hereditary conditions, or having conditions such as diabetes or inflammatory bowel disease. This condition may be diagnosed with tests, such as a colonoscopy and biopsy. Treatment depends on the type and stage of the cancer. Often, treatment includes surgery to remove the abnormal tissue, along with chemotherapy, targeted therapy, or immunotherapy. Keep all follow-up visits. This is important. This information is not intended to replace advice given to you by your health care provider. Make sure you discuss any questions you have with your health care provider. Document Revised: 04/08/2019 Document Reviewed: 04/08/2019 Elsevier Patient Education  2024 [employer].

## 2022-12-29 LAB — BASIC METABOLIC PANEL
BUN/Creatinine Ratio: 23 (calc) — ABNORMAL HIGH (ref 6–22)
BUN: 28 mg/dL — ABNORMAL HIGH (ref 7–25)
CO2: 28 mmol/L (ref 20–32)
Calcium: 8.9 mg/dL (ref 8.6–10.4)
Chloride: 103 mmol/L (ref 98–110)
Creat: 1.21 mg/dL — ABNORMAL HIGH (ref 0.60–0.95)
Glucose, Bld: 83 mg/dL (ref 65–99)
Potassium: 4.2 mmol/L (ref 3.5–5.3)
Sodium: 141 mmol/L (ref 135–146)

## 2022-12-29 LAB — CBC WITH DIFFERENTIAL/PLATELET
Absolute Lymphocytes: 1140 {cells}/uL (ref 850–3900)
Absolute Monocytes: 336 {cells}/uL (ref 200–950)
Basophils Absolute: 18 {cells}/uL (ref 0–200)
Basophils Relative: 0.3 %
Eosinophils Absolute: 12 {cells}/uL — ABNORMAL LOW (ref 15–500)
Eosinophils Relative: 0.2 %
HCT: 29.2 % — ABNORMAL LOW (ref 35.0–45.0)
Hemoglobin: 9.4 g/dL — ABNORMAL LOW (ref 11.7–15.5)
MCH: 30.6 pg (ref 27.0–33.0)
MCHC: 32.2 g/dL (ref 32.0–36.0)
MCV: 95.1 fL (ref 80.0–100.0)
MPV: 10.2 fL (ref 7.5–12.5)
Monocytes Relative: 5.6 %
Neutro Abs: 4494 {cells}/uL (ref 1500–7800)
Neutrophils Relative %: 74.9 %
Platelets: 288 10*3/uL (ref 140–400)
RBC: 3.07 10*6/uL — ABNORMAL LOW (ref 3.80–5.10)
RDW: 15.6 % — ABNORMAL HIGH (ref 11.0–15.0)
Total Lymphocyte: 19 %
WBC: 6 10*3/uL (ref 3.8–10.8)

## 2022-12-29 NOTE — Progress Notes (Signed)
Ms. Fineberg,  As expected, the biopsies confirmed colon cancer.  I know that you saw your primary doctor who is communicating with your oncologist to set up an appointment.  Please let me know if you are having difficulty getting an appointment with oncology, or if you have other questions/concerns that I might be able to answer.

## 2022-12-31 ENCOUNTER — Encounter: Payer: Self-pay | Admitting: *Deleted

## 2022-12-31 NOTE — Progress Notes (Signed)
Dr Myna Hidalgo requests to see patient this week to discuss new diagnosis and hospice/treatment possibilities. Spoke with her daughter, Harriett Sine. Appointment made. She is aware of appointment date, time and location.   Oncology Nurse Navigator Documentation     12/31/2022   12:00 PM  Oncology Nurse Navigator Flowsheets  Navigator Location [facility]  Navigator Encounter Type Telephone  Telephone Outgoing Call  Patient Visit Type MedOnc  Barriers/Navigation Needs Coordination of Care;Education  Interventions Coordination of Care;Education  Acuity Level 2-Minimal Needs (1-2 Barriers Identified)  Coordination of Care Appts  Education Method Verbal;Teach-back  Time Spent with Patient 15

## 2023-01-01 NOTE — Progress Notes (Signed)
Aranesp supportive careplan added back to orders per Dr. Gustavo Lah instructions.

## 2023-01-03 ENCOUNTER — Other Ambulatory Visit: Payer: Self-pay | Admitting: *Deleted

## 2023-01-03 ENCOUNTER — Inpatient Hospital Stay: Payer: Medicare Other | Attending: Hematology & Oncology | Admitting: Hematology & Oncology

## 2023-01-03 ENCOUNTER — Inpatient Hospital Stay: Payer: Medicare Other

## 2023-01-03 ENCOUNTER — Encounter: Payer: Self-pay | Admitting: Hematology & Oncology

## 2023-01-03 VITALS — BP 93/42 | HR 64 | Resp 16 | Ht 59.0 in | Wt 74.0 lb

## 2023-01-03 DIAGNOSIS — D631 Anemia in chronic kidney disease: Secondary | ICD-10-CM | POA: Diagnosis not present

## 2023-01-03 DIAGNOSIS — C186 Malignant neoplasm of descending colon: Secondary | ICD-10-CM | POA: Diagnosis not present

## 2023-01-03 DIAGNOSIS — R627 Adult failure to thrive: Secondary | ICD-10-CM

## 2023-01-03 DIAGNOSIS — R634 Abnormal weight loss: Secondary | ICD-10-CM

## 2023-01-03 DIAGNOSIS — N1831 Chronic kidney disease, stage 3a: Secondary | ICD-10-CM | POA: Diagnosis not present

## 2023-01-03 DIAGNOSIS — D500 Iron deficiency anemia secondary to blood loss (chronic): Secondary | ICD-10-CM

## 2023-01-03 DIAGNOSIS — Z7901 Long term (current) use of anticoagulants: Secondary | ICD-10-CM

## 2023-01-03 DIAGNOSIS — E611 Iron deficiency: Secondary | ICD-10-CM | POA: Diagnosis not present

## 2023-01-03 DIAGNOSIS — C187 Malignant neoplasm of sigmoid colon: Secondary | ICD-10-CM | POA: Insufficient documentation

## 2023-01-03 DIAGNOSIS — R54 Age-related physical debility: Secondary | ICD-10-CM

## 2023-01-03 DIAGNOSIS — C189 Malignant neoplasm of colon, unspecified: Secondary | ICD-10-CM

## 2023-01-03 HISTORY — DX: Malignant neoplasm of colon, unspecified: C18.9

## 2023-01-03 LAB — PREALBUMIN: Prealbumin: 12 mg/dL — ABNORMAL LOW (ref 18–38)

## 2023-01-03 LAB — CMP (CANCER CENTER ONLY)
ALT: 6 U/L (ref 0–44)
AST: 10 U/L — ABNORMAL LOW (ref 15–41)
Albumin: 3.2 g/dL — ABNORMAL LOW (ref 3.5–5.0)
Alkaline Phosphatase: 10 U/L — ABNORMAL LOW (ref 38–126)
Anion gap: 9 (ref 5–15)
BUN: 31 mg/dL — ABNORMAL HIGH (ref 8–23)
CO2: 28 mmol/L (ref 22–32)
Calcium: 8.6 mg/dL — ABNORMAL LOW (ref 8.9–10.3)
Chloride: 101 mmol/L (ref 98–111)
Creatinine: 1.08 mg/dL — ABNORMAL HIGH (ref 0.44–1.00)
GFR, Estimated: 47 mL/min — ABNORMAL LOW (ref >60)
Glucose, Bld: 103 mg/dL — ABNORMAL HIGH (ref 70–99)
Potassium: 4.4 mmol/L (ref 3.5–5.1)
Sodium: 138 mmol/L (ref 135–145)
Total Bilirubin: 0.3 mg/dL (ref 0.0–1.2)
Total Protein: 6.4 g/dL — ABNORMAL LOW (ref 6.5–8.1)

## 2023-01-03 LAB — CBC WITH DIFFERENTIAL (CANCER CENTER ONLY)
Abs Immature Granulocytes: 0.02 10*3/uL (ref 0.00–0.07)
Basophils Absolute: 0 10*3/uL (ref 0.0–0.1)
Basophils Relative: 1 %
Eosinophils Absolute: 0 10*3/uL (ref 0.0–0.5)
Eosinophils Relative: 0 %
HCT: 29.7 % — ABNORMAL LOW (ref 36.0–46.0)
Hemoglobin: 9.3 g/dL — ABNORMAL LOW (ref 12.0–15.0)
Immature Granulocytes: 0 %
Lymphocytes Relative: 19 %
Lymphs Abs: 1.2 10*3/uL (ref 0.7–4.0)
MCH: 30.8 pg (ref 26.0–34.0)
MCHC: 31.3 g/dL (ref 30.0–36.0)
MCV: 98.3 fL (ref 80.0–100.0)
Monocytes Absolute: 0.4 10*3/uL (ref 0.1–1.0)
Monocytes Relative: 7 %
Neutro Abs: 4.3 10*3/uL (ref 1.7–7.7)
Neutrophils Relative %: 73 %
Platelet Count: 242 10*3/uL (ref 150–400)
RBC: 3.02 MIL/uL — ABNORMAL LOW (ref 3.87–5.11)
RDW: 18.3 % — ABNORMAL HIGH (ref 11.5–15.5)
WBC Count: 6 10*3/uL (ref 4.0–10.5)
nRBC: 0 % (ref 0.0–0.2)

## 2023-01-03 LAB — PREPARE RBC (CROSSMATCH)

## 2023-01-03 LAB — FERRITIN: Ferritin: 89 ng/mL (ref 11–307)

## 2023-01-03 NOTE — Progress Notes (Signed)
 Hematology and Oncology Follow Up Visit  [name] [name] [id number] 12-Jun-1924 89 y.o. 01/03/2023   Principle Diagnosis:  Iron  deficiency anemia  Erythropoietin  deficiency anemia  Adenocarcinoma of the sigmoid colon-localized   Current Therapy:        IV iron  as indicated  Aranesp  300 mcg SQ to maintain Hgb > 11   Interim History:  [name] [name] is here today with her daughter for follow-up.  The new information is that she now has colon cancer.  She apparently was found to have a tumor on the CT scan.  She underwent endoscopy.  This was done on 12/25/2022.  A invasive moderately differentiated adenocarcinoma was found.  This was partially obstructing.  She does not have any evidence of metastatic disease.  I think the real issue is how we can try to help with this.  I know she is 89 years old.  She certainly does not have the best performance status.  She is not have any pain.  She is able to go to the bathroom.  She has had no obvious melena or bright red blood per rectum.  She does have some blood on the toilet paper from some hemorrhoids.  It was mentioned recommended that she go under Hospice.  I am unsure that she would be a Hospice candidate since this is not metastatic.  Ideally, it be nice to try to resect out this tumor.  Again, being on 89 years old was not the best performance status in the world this might be somewhat challenging.  She comes in a wheelchair.  She just has been quite weak.  Her hemoglobin last week was 9.4.  I really think that she would benefit from a transfusion.  I think 1 unit of blood would be reasonable.  It will be interesting to see what her iron  studies look like.  She is quite pale.  She is not hurting.  She is having no cough or shortness of breath.  Currently, I would have said that her performance status is probably ECOG 2, at best.   Wt Readings from Last 3 Encounters:  01/03/23 74 lb (33.6 kg)  12/28/22 76 lb (34.5 kg)  12/25/22 75  lb 9.9 oz (34.3 kg)    Traps for each: Medications:  Allergies as of 01/03/2023       Reactions   Amlodipine  Shortness Of Breath   Darifenacin Hydrobromide Other (See Comments)   dizziness   Sulfonamide Derivatives Other (See Comments)   dizziness   Other Other (See Comments)   dizziness   Tramadol  Other (See Comments)   Insomnia, anorexia        Medication List        Accurate as of January 03, 2023  1:15 PM. If you have any questions, ask your nurse or doctor.          ALPRAZolam  0.25 MG tablet Commonly known as: XANAX  Take 0.5-1 tablets (0.125-0.25 mg total) by mouth 2 (two) times daily as needed for anxiety.   betamethasone dipropionate 0.05 % cream Apply 1 Application topically 2 (two) times daily.   cephALEXin  250 MG capsule Commonly known as: KEFLEX  Take by mouth 4 (four) times daily.   famotidine  20 MG tablet Commonly known as: PEPCID  Take 20 mg by mouth at bedtime.   Hemocyte-F 324-1 MG Tabs Generic drug: Ferrous Fumarate -Folic Acid  Take 1 tablet by mouth daily.   hydrOXYzine  10 MG tablet Commonly known as: ATARAX  Take 10 mg by mouth at bedtime as needed  for itching.   isosorbide  mononitrate 30 MG 24 hr tablet Commonly known as: IMDUR  Take 1 tablet (30 mg total) by mouth daily.   levocetirizine 5 MG tablet Commonly known as: XYZAL  Take 2.5 mg by mouth daily at 12 noon.   metoprolol  succinate 25 MG 24 hr tablet Commonly known as: TOPROL -XL Take 1 tablet (25 mg total) by mouth daily.   ondansetron  4 MG tablet Commonly known as: ZOFRAN  Take 1 tablet (4 mg total) by mouth every 6 (six) hours.   pantoprazole  40 MG tablet Commonly known as: PROTONIX  TAKE 1 TABLET BY MOUTH EVERY DAY   Vitamin D  50 MCG (2000 UT) Caps Take 2,000 Units by mouth daily at 6 (six) AM.        Allergies:  Allergies  Allergen Reactions   Amlodipine  Shortness Of Breath   Darifenacin Hydrobromide Other (See Comments)    dizziness   Sulfonamide Derivatives  Other (See Comments)    dizziness   Other Other (See Comments)    dizziness   Tramadol  Other (See Comments)    Insomnia, anorexia    Past Medical History, Surgical history, Social history, and Family History were reviewed and updated.  Review of Systems: Review of Systems  Constitutional:  Positive for malaise/fatigue.  HENT: Negative.    Eyes: Negative.   Respiratory: Negative.    Cardiovascular: Negative.   Gastrointestinal: Negative.   Genitourinary: Negative.   Musculoskeletal: Negative.   Skin: Negative.   Neurological: Negative.   Endo/Heme/Allergies: Negative.   Psychiatric/Behavioral: Negative.        Physical Exam:  height is 4' 11 (1.499 m) and weight is 74 lb (33.6 kg). Her blood pressure is 93/42 (abnormal) and her pulse is 64. Her respiration is 16 and oxygen saturation is 100%.   Wt Readings from Last 3 Encounters:  01/03/23 74 lb (33.6 kg)  12/28/22 76 lb (34.5 kg)  12/25/22 75 lb 9.9 oz (34.3 kg)    Physical Exam Vitals reviewed.  HENT:     Head: Normocephalic and atraumatic.  Eyes:     Pupils: Pupils are equal, round, and reactive to light.  Cardiovascular:     Rate and Rhythm: Normal rate and regular rhythm.     Heart sounds: Normal heart sounds.  Pulmonary:     Effort: Pulmonary effort is normal.     Breath sounds: Normal breath sounds.  Abdominal:     General: Bowel sounds are normal.     Palpations: Abdomen is soft.  Musculoskeletal:        General: No tenderness or deformity. Normal range of motion.     Cervical back: Normal range of motion.  Lymphadenopathy:     Cervical: No cervical adenopathy.  Skin:    General: Skin is warm and dry.     Findings: No erythema or rash.  Neurological:     Mental Status: She is alert and oriented to person, place, and time.  Psychiatric:        Behavior: Behavior normal.        Thought Content: Thought content normal.        Judgment: Judgment normal.    Lab Results  Component Value Date    WBC 6.0 12/28/2022   HGB 9.4 (L) 12/28/2022   HCT 29.2 (L) 12/28/2022   MCV 95.1 12/28/2022   PLT 288 12/28/2022   Lab Results  Component Value Date   FERRITIN 68 11/06/2022   IRON  38 11/06/2022   TIBC 295 11/06/2022   UIBC 257  11/06/2022   IRONPCTSAT 13 11/06/2022   Lab Results  Component Value Date   RETICCTPCT 1.4 11/06/2022   RBC 3.07 (L) 12/28/2022   No results found for: KPAFRELGTCHN, LAMBDASER, KAPLAMBRATIO No results found for: IGGSERUM, IGA, IGMSERUM No results found for: STEPHANY CARLOTA BENSON MARKEL EARLA JOANNIE DOC VICK, SPEI   Chemistry      Component Value Date/Time   NA 141 12/28/2022 1526   NA 139 04/11/2021 1312   K 4.2 12/28/2022 1526   CL 103 12/28/2022 1526   CO2 28 12/28/2022 1526   BUN 28 (H) 12/28/2022 1526   BUN 30 04/11/2021 1312   CREATININE 1.21 (H) 12/28/2022 1526      Component Value Date/Time   CALCIUM  8.9 12/28/2022 1526   ALKPHOS 11 (L) 12/24/2022 1308   AST 14 (L) 12/24/2022 1308   AST 13 (L) 11/06/2022 1418   ALT 10 12/24/2022 1308   ALT 7 11/06/2022 1418   BILITOT 0.7 12/24/2022 1308   BILITOT 0.3 11/06/2022 1418     Impression and Plan: [name] [name] is a very pleasant 89 yo caucasian female with multifactorial anemia (IDA and erythropoietin  deficiency).  She now has colon cancer.  This appears to be a localized process.  I think the real issue is how to treat this colon cancer.  I would think that at some point, this is going to cause her problems with obstruction.  I am not sure that she really is a candidate for hospice right now.  Again I do think there would be some means of trying to help this cancer.  I think 1 option would be to see if surgery would take her.  Again I think this might be a long shot due to her age and her overall performance status.  Another possibility might be low-dose chemotherapy with Xeloda.  Again, she is 89 years old.  This might be difficult on  her.  I do think that she would benefit from a transfusion.  I would see about doing this tomorrow.  We will see what her iron  studies show.  I would also like to check her.  Albumin.  This is very interesting.  I know there is no right or wrong answer.  I know that she is trying her best.  I want her to have some quality of life.  Her family was with her.  We had a long talk.  I would like to see her back in a couple weeks or so to see how she is doing and to see if she has gotten a little bit stronger with the transfusion. SABRA     Maude JONELLE Crease, MD 1/2/20251:15 PM

## 2023-01-04 ENCOUNTER — Inpatient Hospital Stay: Payer: Medicare Other

## 2023-01-04 VITALS — BP 142/50 | HR 57 | Temp 97.8°F | Resp 18

## 2023-01-04 DIAGNOSIS — R634 Abnormal weight loss: Secondary | ICD-10-CM

## 2023-01-04 DIAGNOSIS — R627 Adult failure to thrive: Secondary | ICD-10-CM

## 2023-01-04 DIAGNOSIS — N1831 Chronic kidney disease, stage 3a: Secondary | ICD-10-CM | POA: Diagnosis not present

## 2023-01-04 DIAGNOSIS — Z7901 Long term (current) use of anticoagulants: Secondary | ICD-10-CM

## 2023-01-04 DIAGNOSIS — C187 Malignant neoplasm of sigmoid colon: Secondary | ICD-10-CM | POA: Diagnosis not present

## 2023-01-04 DIAGNOSIS — E611 Iron deficiency: Secondary | ICD-10-CM | POA: Diagnosis not present

## 2023-01-04 DIAGNOSIS — R54 Age-related physical debility: Secondary | ICD-10-CM

## 2023-01-04 DIAGNOSIS — D631 Anemia in chronic kidney disease: Secondary | ICD-10-CM | POA: Diagnosis not present

## 2023-01-04 DIAGNOSIS — D500 Iron deficiency anemia secondary to blood loss (chronic): Secondary | ICD-10-CM

## 2023-01-04 LAB — IRON AND IRON BINDING CAPACITY (CC-WL,HP ONLY)
Iron: 32 ug/dL (ref 28–170)
Saturation Ratios: 16 % (ref 10.4–31.8)
TIBC: 204 ug/dL — ABNORMAL LOW (ref 250–450)
UIBC: 172 ug/dL (ref 148–442)

## 2023-01-04 MED ORDER — PANCRELIPASE (LIP-PROT-AMYL) 36000-114000 UNITS PO CPEP: ORAL_CAPSULE | ORAL | 11 refills | Status: DC

## 2023-01-04 MED ORDER — SODIUM CHLORIDE 0.9% IV SOLUTION
250.0000 mL | INTRAVENOUS | Status: DC
  Administered 2023-01-04: 250 mL via INTRAVENOUS

## 2023-01-04 MED ORDER — ACETAMINOPHEN 325 MG PO TABS: 650.0000 mg | ORAL_TABLET | Freq: Once | ORAL | Status: DC

## 2023-01-04 MED ORDER — DARBEPOETIN ALFA 300 MCG/0.6ML IJ SOSY
300.0000 ug | PREFILLED_SYRINGE | Freq: Once | INTRAMUSCULAR | Status: AC
  Administered 2023-01-04: 300 ug via SUBCUTANEOUS
  Filled 2023-01-04: qty 0.6

## 2023-01-04 NOTE — Addendum Note (Signed)
 Addended by: Josph Macho on: 01/04/2023 03:33 PM   Modules accepted: Orders

## 2023-01-04 NOTE — Patient Instructions (Signed)

## 2023-01-07 LAB — BPAM RBC
Blood Product Expiration Date: 202501302359
ISSUE DATE / TIME: 202501030817
Unit Type and Rh: 5100

## 2023-01-07 LAB — TYPE AND SCREEN
ABO/RH(D): O POS
Antibody Screen: NEGATIVE
Unit division: 0

## 2023-01-08 ENCOUNTER — Inpatient Hospital Stay: Payer: Medicare Other

## 2023-01-21 ENCOUNTER — Other Ambulatory Visit: Payer: Self-pay

## 2023-01-21 DIAGNOSIS — Z08 Encounter for follow-up examination after completed treatment for malignant neoplasm: Secondary | ICD-10-CM | POA: Diagnosis not present

## 2023-01-21 DIAGNOSIS — R079 Chest pain, unspecified: Secondary | ICD-10-CM

## 2023-01-21 DIAGNOSIS — L821 Other seborrheic keratosis: Secondary | ICD-10-CM | POA: Diagnosis not present

## 2023-01-21 DIAGNOSIS — Z85828 Personal history of other malignant neoplasm of skin: Secondary | ICD-10-CM | POA: Diagnosis not present

## 2023-01-21 DIAGNOSIS — L2989 Other pruritus: Secondary | ICD-10-CM | POA: Diagnosis not present

## 2023-01-21 DIAGNOSIS — L814 Other melanin hyperpigmentation: Secondary | ICD-10-CM | POA: Diagnosis not present

## 2023-01-21 DIAGNOSIS — D225 Melanocytic nevi of trunk: Secondary | ICD-10-CM | POA: Diagnosis not present

## 2023-01-21 MED ORDER — ISOSORBIDE MONONITRATE ER 30 MG PO TB24: 30.0000 mg | ORAL_TABLET | Freq: Every day | ORAL | 1 refills | Status: DC

## 2023-01-21 NOTE — Progress Notes (Deleted)
Cardiology Office Note:    Date:  01/21/2023   ID:  [name] [name], DOB 09-Jan-1924, MRN [id number]  PCP:  Bradd Canary, MD   [facility] Providers Cardiologist:  Meriam Sprague, MD (Inactive) {    Referring MD: Bradd Canary, MD    History of Present Illness:    [name] [name] is a 89 y.o. female with a hx of persistent atrial fibrillation, HTN, sinus bradycardia, HLD, anemia, anxiety, arthritis, diverticulosis, GERD, and thyroid disease who was previously followed by Dr. Delton See who now presents to clinic for follow-up.  Per review of the record, she been felt to have had chronic atrial fib. TTE 2018 showed EF 55-60%, mild MR, moderate LAE, mild TR, normal PASP. She was seen in the ER 06/20/17 with discomfort in chest, upper abdomen and back. CT angio chest/abd/pelvis showed no acute dissection/ aneurysm, + atherosclerotic vascular disease with suspected renal artery stenosis, possible 16 mm hyperenhancing splenic mass, sigmoid diverticular disease . Out-patient w/u of splenic mass recommended. She was then re-hospitalized with weakness and decreased appetite found to be hyponatremic and slightly bradycardic.  She was treated for possible SIADH. Initially the patient was bradycardic and her beta-blocker was held but heart rate increased to greater than 100. She was seen in consultation by Dr. Anne Fu who felt that her rhythm was actually atrial flutter appearing. He advised continued rate control on low-dose Toprol and watch for bradycardia. Her Coumadin was switched to Eliquis during begininng of Covid pandemic. Event monitoring in 09/2018 with findings of persistent atrial flutter with variable block with minimum HR 45, maximum HR 136 and average HR 65 BPM. 1 episode of ventricular tachycardia lasting 8 beats.  Was last seen in clinic on 04/2021 where she was doing well from a CV standpoint.  Was hospitalized on 09/18-09/19/23 for hemoglobin 5.2. Colonoscopy was deferred due to age.  She was started on PO iron. Had follow-up with heme as well. Apixaban was not stopped.   Was last seen in clinic on 10/2021 where she was stable from a CV standpoint. Discussed coming off of apixaban, but she wished to continue the medication.  Today, the patient overall feels well. No chest pain, SOB, orthopnea, or PND. Rare palpitations that are brief. Has chronic abdominal pain due to a rectocele and is followed by her PCP. Continues to have intermittent blood in her stool and is on iron infusions. No lightheadedness, dizziness or falls. We re-discussed her apixaban and she is still very worried about the chance of stroke as she had multiple family members with stroke. She would like to continue her apixaban for now. Discussed she can always change her mind in the future and I would strongly recommend her stopping it if she becomes severely anemic again.  The pt was previously followed by [name] [name], last seen in July 2024  Past Medical History:  Diagnosis Date   Anemia    Anxiety    Arthritis    hips, knees   Colon cancer (HCC) 01/03/2023   Current use of long term anticoagulation    Diverticulitis of colon 02/06/2015   Gait difficulty    GERD (gastroesophageal reflux disease)    Headache(784.0) 06/22/2012   Hyperlipidemia    Hypertension    IBS (irritable bowel syndrome)    Incontinence    Loss of weight 06/05/2015   Medicare annual wellness visit, subsequent 02/05/2014   Melena    Mild mitral regurgitation    Mild tricuspid regurgitation  Mitral and aortic regurgitation    Mitral valve prolapse    hx of - not seen on recent echoes   Osteopenia    Paroxysmal atrial flutter (HCC)    Pedal edema 10/01/2016   Persistent atrial fibrillation (HCC)    Personal history of colonic polyps    Renal lithiasis 06/26/2015   Sinus bradycardia    Thyroid disease    hypo   Vitamin B12 deficiency 03/16/2016   Vitamin D deficiency     Past Surgical History:  Procedure Laterality  Date   BIOPSY  12/25/2022   Procedure: BIOPSY;  Surgeon: Jenel Lucks, MD;  Location: [facility]COPY;  Service: Gastroenterology;;   BOTOX INJECTION N/A 09/03/2012   Procedure: BOTOX INJECTION;  Surgeon: Louis Meckel, MD;  Location: [facility] ENDOSCOPY;  Service: Endoscopy;  Laterality: N/A;   BREAST BIOPSY Left    BREAST LUMPECTOMY Right 08/18/2014   Procedure: RIGHT BREAST LUMPECTOMY;  Surgeon: Abigail Miyamoto, MD;  Location: [facility];  Service: General;  Laterality: Right;   CARDIAC ELECTROPHYSIOLOGY MAPPING AND ABLATION     CATARACT EXTRACTION, BILATERAL     ESOPHAGOGASTRODUODENOSCOPY N/A 09/03/2012   Procedure: ESOPHAGOGASTRODUODENOSCOPY (EGD);  Surgeon: Louis Meckel, MD;  Location: [facility];  Service: Endoscopy;  Laterality: N/A;   ESOPHAGOGASTRODUODENOSCOPY (EGD) WITH PROPOFOL N/A 12/02/2018   Procedure: ESOPHAGOGASTRODUODENOSCOPY (EGD) WITH PROPOFOL;  Surgeon: Sherrilyn Rist, MD;  Location: [facility] ENDOSCOPY;  Service: Gastroenterology;  Laterality: N/A;   ESOPHAGOSCOPY W/ BOTOX INJECTION     FLEXIBLE SIGMOIDOSCOPY N/A 12/25/2022   Procedure: FLEXIBLE SIGMOIDOSCOPY;  Surgeon: Jenel Lucks, MD;  Location: [facility] ENDOSCOPY;  Service: Gastroenterology;  Laterality: N/A;   I & D EXTREMITY Right 11/06/2012   Procedure: IRRIGATION AND DEBRIDEMENT EXTREMITY Right Ring Finger;  Surgeon: Tami Ribas, MD;  Location: [facility] OR;  Service: Orthopedics;  Laterality: Right;   PARTIAL HYSTERECTOMY     ovaries left in place   skin cancer removal      Current Medications: No outpatient medications have been marked as taking for the 01/22/23 encounter (Appointment) with Pricilla Riffle, MD.     Allergies:   Amlodipine, Darifenacin hydrobromide, Sulfonamide derivatives, Other, and Tramadol   Social History   Socioeconomic History   Marital status: Widowed    Spouse name: Not on file   Number of children: 5   Years of education: Not on file   Highest education level: Not on  file  Occupational History   Occupation: retired    Associate Professor: RETIRED  Tobacco Use   Smoking status: Never   Smokeless tobacco: Never  Vaping Use   Vaping status: Never Used  Substance and Sexual Activity   Alcohol use: No   Drug use: No   Sexual activity: Not Currently    Comment: lives with Daughter, grandson and his family. no dietary restricitons.  Other Topics Concern   Not on file  Social History Narrative   Not on file   Social Drivers of Health   Financial Resource Strain: Low Risk  (12/11/2021)   Overall Financial Resource Strain (CARDIA)    Difficulty of Paying Living Expenses: Not hard at all  Food Insecurity: No Food Insecurity (12/25/2022)   Hunger Vital Sign    Worried About Running Out of Food in the Last Year: Never true    Ran Out of Food in the Last Year: Never true  Transportation Needs: No Transportation Needs (12/25/2022)   PRAPARE - Administrator, Civil Service (  Medical): No    Lack of Transportation (Non-Medical): No  Physical Activity: Inactive (12/11/2021)   Exercise Vital Sign    Days of Exercise per Week: 0 days    Minutes of Exercise per Session: 0 min  Stress: No Stress Concern Present (12/11/2021)   [employer] of Occupational Health - Occupational Stress Questionnaire    Feeling of Stress : Not at all  Social Connections: Socially Isolated (12/11/2021)   Social Connection and Isolation Panel [NHANES]    Frequency of Communication with Friends and Family: Once a week    Frequency of Social Gatherings with Friends and Family: Once a week    Attends Religious Services: Never    Database administrator or Organizations: No    Attends Banker Meetings: Never    Marital Status: Widowed     Family History: The patient's family history includes Allergies in her daughter; COPD in her daughter, daughter, and father; CVA in her mother; Diabetes in her mother; Heart disease in her maternal aunt and maternal uncle;  Rheum arthritis in her father; Stroke in her daughter, maternal grandfather, and mother. There is no history of Colon cancer, Colon polyps, Esophageal cancer, Gallbladder disease, Kidney disease, Heart attack, or Hypertension.  ROS:   As per HPI   EKGs/Labs/Other Studies Reviewed:    The following studies were reviewed today:  Cardiac Studies & Procedures      ECHOCARDIOGRAM  ECHOCARDIOGRAM COMPLETE 12/10/2016  Narrative *[place]* *[facility] [address] [place], [place] [address] [phone]  ------------------------------------------------------------------- Transthoracic Echocardiography  Patient:    [name] [name], [name] [name] MR #:       [id number] Study Date: 12/10/2016 Gender:     F Age:        89 Height:     149.9 cm Weight:     46.3 kg BSA:        1.39 m^2 Pt. Status: Room:       D33C  ATTENDING    Ray, Arbie Cookey REFERRING    Marcos Eke PERFORMING   [facility], Inpatient ADMITTING    Los Alvarez, Courage SONOGRAPHER  Sinda Du, RDCS  cc:  ------------------------------------------------------------------- LV EF: 55% -   60%  ------------------------------------------------------------------- History:   PMH:  chest pain.  Atrial fibrillation.  Risk factors: Hypertension. Dyslipidemia.  ------------------------------------------------------------------- Study Conclusions  - Left ventricle: The cavity size was normal. There was mild concentric hypertrophy. Systolic function was normal. The estimated ejection fraction was in the range of 55% to 60%. Wall motion was normal; there were no regional wall motion abnormalities. The study is not technically sufficient to allow evaluation of LV diastolic function. - Aortic valve: Transvalvular velocity was within the normal range. There was no stenosis. There was no regurgitation. - Mitral valve: Transvalvular velocity was within the normal range. There was no  evidence for stenosis. There was mild regurgitation. - Left atrium: The atrium was moderately dilated. - Right ventricle: Systolic function was normal. - Atrial septum: No defect or patent foramen ovale was identified. - Tricuspid valve: There was mild regurgitation. - Pulmonary arteries: Systolic pressure was within the normal range. PA peak pressure: 36 mm Hg (S).  ------------------------------------------------------------------- Study data:  Comparison was made to the study of 10/27/2012.  Study status:  Routine.  Procedure:  Transthoracic echocardiography. Image quality was adequate.          Transthoracic echocardiography.  M-mode, complete 2D, spectral Doppler, and color Doppler.  Birthdate:  Patient birthdate: September 15, 1924.  Age:  Patient is 89 yr old.  Sex:  Gender: female.    BMI: 20.6 kg/m^2.  Blood pressure:     134/67  Patient status:  Inpatient.  Study date: Study date: 12/10/2016. Study time: 02:10 PM.  Location:  Emergency department.  -------------------------------------------------------------------  ------------------------------------------------------------------- Left ventricle:  The cavity size was normal. There was mild concentric hypertrophy. Systolic function was normal. The estimated ejection fraction was in the range of 55% to 60%. Wall motion was normal; there were no regional wall motion abnormalities. The transmitral flow pattern was normal. The deceleration time of the early transmitral flow velocity was normal. The pulmonary vein flow pattern was normal. The tissue Doppler parameters were normal. The study is not technically sufficient to allow evaluation of LV diastolic function.  ------------------------------------------------------------------- Aortic valve:   Trileaflet; normal thickness leaflets. Mobility was not restricted.  Doppler:  Transvalvular velocity was within the normal range. There was no stenosis. There was no  regurgitation.  ------------------------------------------------------------------- Aorta:  Aortic root: The aortic root was normal in size.  ------------------------------------------------------------------- Mitral valve:   Structurally normal valve.   Mobility was not restricted.  Doppler:  Transvalvular velocity was within the normal range. There was no evidence for stenosis. There was mild regurgitation.    Valve area by pressure half-time: 3.67 cm^2. Indexed valve area by pressure half-time: 2.64 cm^2/m^2.    Peak gradient (D): 4 mm Hg.  ------------------------------------------------------------------- Left atrium:  The atrium was moderately dilated.  ------------------------------------------------------------------- Atrial septum:  No defect or patent foramen ovale was identified.  ------------------------------------------------------------------- Right ventricle:  The cavity size was normal. Wall thickness was normal. Systolic function was normal.  ------------------------------------------------------------------- Pulmonic valve:    Structurally normal valve.   Cusp separation was normal.  Doppler:  Transvalvular velocity was within the normal range. There was no evidence for stenosis. There was no regurgitation.  ------------------------------------------------------------------- Tricuspid valve:   Structurally normal valve.    Doppler: Transvalvular velocity was within the normal range. There was mild regurgitation.  ------------------------------------------------------------------- Pulmonary artery:   The main pulmonary artery was normal-sized. Systolic pressure was within the normal range.  ------------------------------------------------------------------- Right atrium:  The atrium was normal in size.  ------------------------------------------------------------------- Pericardium:  There was no pericardial  effusion.  ------------------------------------------------------------------- Systemic veins: Inferior vena cava: The vessel was normal in size. The respirophasic diameter changes were blunted (< 50%), consistent with elevated central venous pressure.  ------------------------------------------------------------------- Measurements  Left ventricle                          Value          Reference LV ID, ED, PLAX chordal                 45    mm       43 - 52 LV ID, ES, PLAX chordal                 35    mm       23 - 38 LV fx shortening, PLAX chordal  (L)     22    %        >=29 LV PW thickness, ED                     13    mm       ---------- IVS/LV PW ratio, ED  1              <=1.3 Stroke volume, 2D                       46    ml       ---------- Stroke volume/bsa, 2D                   33    ml/m^2   ---------- LV e&', lateral                          11    cm/s     ---------- LV E/e&', lateral                        9.02           ---------- LV e&', medial                           6.31  cm/s     ---------- LV E/e&', medial                         15.72          ---------- LV e&', average                          8.66  cm/s     ---------- LV E/e&', average                        11.46          ----------  Ventricular septum                      Value          Reference IVS thickness, ED                       13    mm       ----------  LVOT                                    Value          Reference LVOT ID, S                              17    mm       ---------- LVOT area                               2.27  cm^2     ---------- LVOT peak velocity, S                   99.8  cm/s     ---------- LVOT mean velocity, S                   64.4  cm/s     ---------- LVOT VTI, S  20.2  cm       ---------- LVOT peak gradient, S                   4     mm Hg    ----------  Aorta                                   Value           Reference Aortic root ID, ED                      26    mm       ----------  Left atrium                             Value          Reference LA ID, A-P, ES                          30    mm       ---------- LA ID/bsa, A-P                          2.16  cm/m^2   <=2.2 LA volume, S                            42.1  ml       ---------- LA volume/bsa, S                        30.3  ml/m^2   ---------- LA volume, ES, 1-p A4C                  31.2  ml       ---------- LA volume/bsa, ES, 1-p A4C              22.4  ml/m^2   ---------- LA volume, ES, 1-p A2C                  48    ml       ---------- LA volume/bsa, ES, 1-p A2C              34.5  ml/m^2   ----------  Mitral valve                            Value          Reference Mitral E-wave peak velocity             99.2  cm/s     ---------- Mitral A-wave peak velocity             44.7  cm/s     ---------- Mitral deceleration time                204   ms       150 - 230 Mitral pressure half-time               60    ms       ---------- Mitral peak gradient, D  4     mm Hg    ---------- Mitral E/A ratio, peak                  2.2            ---------- Mitral valve area, PHT, DP              3.67  cm^2     ---------- Mitral valve area/bsa, PHT, DP          2.64  cm^2/m^2 ----------  Pulmonary arteries                      Value          Reference PA pressure, S, DP              (H)     36    mm Hg    <=30  Tricuspid valve                         Value          Reference Tricuspid regurg peak velocity          264   cm/s     ---------- Tricuspid peak RV-RA gradient           28    mm Hg    ----------  Right atrium                            Value          Reference RA ID, S-I, ES, A4C                     43.8  mm       34 - 49 RA area, ES, A4C                        13.4  cm^2     8.3 - 19.5 RA volume, ES, A/L                      34.3  ml       ---------- RA volume/bsa, ES, A/L                  24.7  ml/m^2    ----------  Systemic veins                          Value          Reference Estimated CVP                           8     mm Hg    ----------  Right ventricle                         Value          Reference RV ID, minor axis, ED, A4C base         32    mm       ---------- TAPSE                                   16.6  mm       ----------  RV pressure, S, DP              (H)     36    mm Hg    <=30 RV s&', lateral, S                       8.92  cm/s     ----------  Legend: (L)  and  (H)  mark values outside specified reference range.  ------------------------------------------------------------------- Prepared and Electronically Authenticated by  Chilton Si, MD 2018-12-10T14:50:43   MONITORS  LONG TERM MONITOR (3-14 DAYS) 09/05/2018  Narrative  Persistent atrial flutter with variable block with minimum HR 45, maximum HR 136 and average HR 65 BPM.  1 episode of ventricular tachycardia lasting 8 beats.  Persistent atrial flutter with variable block with minimum HR 45, maximum HR 136 and average HR 65 BPM. Continue current medical management.              EKGs    No new tracing  Recent Labs: 12/24/2022: TSH 11.058 01/03/2023: ALT 6; BUN 31; Creatinine 1.08; Hemoglobin 9.3; Platelet Count 242; Potassium 4.4; Sodium 138   Recent Lipid Panel    Component Value Date/Time   CHOL 166 11/27/2022 1332   TRIG 119.0 11/27/2022 1332   HDL 41.50 11/27/2022 1332   CHOLHDL 4 11/27/2022 1332   VLDL 23.8 11/27/2022 1332   LDLCALC 101 (H) 11/27/2022 1332   LDLCALC 72 10/29/2019 1358   LDLDIRECT 72.1 01/19/2009 1026     Physical Exam:    VS:  There were no vitals taken for this visit.    Wt Readings from Last 3 Encounters:  01/03/23 74 lb (33.6 kg)  12/28/22 76 lb (34.5 kg)  12/25/22 75 lb 9.9 oz (34.3 kg)     GEN: Elderly, comfortable, NAD HEENT: Normal NECK: No JVD; No carotid bruits CARDIAC: Irregular, 2/6 systolic murmur heard throughout the precordium RESPIRATORY:   Clear to auscultation without rales, wheezing or rhonchi  ABDOMEN: Soft, non-tender, non-distended MUSCULOSKELETAL:  Warm, trace pedal edema SKIN: Warm and dry NEUROLOGIC:  Alert and oriented x 3 PSYCHIATRIC:  Normal affect   ASSESSMENT:    No diagnosis found.     PLAN:    In order of problems listed above:  #Permanent Afib: #Paroxysmal Aflutter: CHADs-vasc 5. On eliquis and low dose metop. She would like to continue to stay on the apixaban for stroke prevention despite ongoing intermittent GIB. Discussed it is very reasonable to stop if she would like to in the future.  -Continue apixaban 2.5mg  BID per patient preference; she will continue to think it over going forward -Continue metop 25mg  XL daily  #GIB: #Iron Deficiency Anemia: Patient with admission 09/2021 for hemoglobin 5.2. Received 2 units pRBCs and was managed conservatively. Had follow-up with oncology and was continued on oral iron and received IV iron as well. We discussed the risks vs benefits of stopping the apixaban as it is completely reasonable at her age. She would like to continue it for now, but will continue to think it over. -She would like to continue the apixaban for now but will continue to think it over -Continue management per PCP  #HTN: Controlled and at goal. No episodes of orthostasis. -Continue losartan 25mg  daily -Continue metop 25mg  XL daily -Continue imdur 30mg  daily  #HLD: -Not on statin; given age, will not re-check lipids  #Mild MR: Asymptomatic. No further monitoring indicated unless clinical change.  Follow-up: 6 months.  Medication Adjustments/Labs and Tests  Ordered: Current medicines are reviewed at length with the patient today.  Concerns regarding medicines are outlined above.   No orders of the defined types were placed in this encounter.  No orders of the defined types were placed in this encounter.  There are no Patient Instructions on file for this  visit.   Signed, Dietrich Pates, MD  01/21/2023 8:50 PM    [facility]

## 2023-01-22 ENCOUNTER — Ambulatory Visit: Payer: Medicare Other | Admitting: Internal Medicine

## 2023-01-24 ENCOUNTER — Inpatient Hospital Stay: Payer: Medicare Other

## 2023-01-24 ENCOUNTER — Other Ambulatory Visit: Payer: Self-pay

## 2023-01-24 ENCOUNTER — Encounter: Payer: Self-pay | Admitting: Hematology & Oncology

## 2023-01-24 ENCOUNTER — Inpatient Hospital Stay: Payer: Medicare Other | Admitting: Hematology & Oncology

## 2023-01-24 VITALS — BP 117/56 | HR 67 | Temp 98.0°F | Resp 16 | Ht 59.0 in | Wt 76.0 lb

## 2023-01-24 DIAGNOSIS — C182 Malignant neoplasm of ascending colon: Secondary | ICD-10-CM

## 2023-01-24 DIAGNOSIS — N1831 Chronic kidney disease, stage 3a: Secondary | ICD-10-CM

## 2023-01-24 DIAGNOSIS — C187 Malignant neoplasm of sigmoid colon: Secondary | ICD-10-CM | POA: Diagnosis not present

## 2023-01-24 DIAGNOSIS — E611 Iron deficiency: Secondary | ICD-10-CM | POA: Diagnosis not present

## 2023-01-24 DIAGNOSIS — D500 Iron deficiency anemia secondary to blood loss (chronic): Secondary | ICD-10-CM

## 2023-01-24 DIAGNOSIS — C186 Malignant neoplasm of descending colon: Secondary | ICD-10-CM

## 2023-01-24 DIAGNOSIS — D631 Anemia in chronic kidney disease: Secondary | ICD-10-CM | POA: Diagnosis not present

## 2023-01-24 DIAGNOSIS — Z7901 Long term (current) use of anticoagulants: Secondary | ICD-10-CM

## 2023-01-24 LAB — CMP (CANCER CENTER ONLY)
ALT: 8 U/L (ref 0–44)
AST: 13 U/L — ABNORMAL LOW (ref 15–41)
Albumin: 3 g/dL — ABNORMAL LOW (ref 3.5–5.0)
Alkaline Phosphatase: 15 U/L — ABNORMAL LOW (ref 38–126)
Anion gap: 5 (ref 5–15)
BUN: 31 mg/dL — ABNORMAL HIGH (ref 8–23)
CO2: 31 mmol/L (ref 22–32)
Calcium: 8.7 mg/dL — ABNORMAL LOW (ref 8.9–10.3)
Chloride: 103 mmol/L (ref 98–111)
Creatinine: 0.95 mg/dL (ref 0.44–1.00)
GFR, Estimated: 54 mL/min — ABNORMAL LOW (ref >60)
Glucose, Bld: 109 mg/dL — ABNORMAL HIGH (ref 70–99)
Potassium: 4.6 mmol/L (ref 3.5–5.1)
Sodium: 139 mmol/L (ref 135–145)
Total Bilirubin: 0.4 mg/dL (ref 0.0–1.2)
Total Protein: 5.8 g/dL — ABNORMAL LOW (ref 6.5–8.1)

## 2023-01-24 LAB — CEA (ACCESS): CEA (CHCC): 142.11 ng/mL — ABNORMAL HIGH (ref 0.00–5.00)

## 2023-01-24 LAB — CBC WITH DIFFERENTIAL (CANCER CENTER ONLY)
Abs Immature Granulocytes: 0.1 10*3/uL — ABNORMAL HIGH (ref 0.00–0.07)
Basophils Absolute: 0 10*3/uL (ref 0.0–0.1)
Basophils Relative: 0 %
Eosinophils Absolute: 0 10*3/uL (ref 0.0–0.5)
Eosinophils Relative: 0 %
HCT: 30.5 % — ABNORMAL LOW (ref 36.0–46.0)
Hemoglobin: 9.6 g/dL — ABNORMAL LOW (ref 12.0–15.0)
Immature Granulocytes: 1 %
Lymphocytes Relative: 11 %
Lymphs Abs: 0.8 10*3/uL (ref 0.7–4.0)
MCH: 30.4 pg (ref 26.0–34.0)
MCHC: 31.5 g/dL (ref 30.0–36.0)
MCV: 96.5 fL (ref 80.0–100.0)
Monocytes Absolute: 0.4 10*3/uL (ref 0.1–1.0)
Monocytes Relative: 5 %
Neutro Abs: 6.1 10*3/uL (ref 1.7–7.7)
Neutrophils Relative %: 83 %
Platelet Count: 235 10*3/uL (ref 150–400)
RBC: 3.16 MIL/uL — ABNORMAL LOW (ref 3.87–5.11)
RDW: 17.2 % — ABNORMAL HIGH (ref 11.5–15.5)
WBC Count: 7.4 10*3/uL (ref 4.0–10.5)
nRBC: 0 % (ref 0.0–0.2)

## 2023-01-24 LAB — RETICULOCYTES
Immature Retic Fract: 14.2 % (ref 2.3–15.9)
RBC.: 3.17 MIL/uL — ABNORMAL LOW (ref 3.87–5.11)
Retic Count, Absolute: 58 10*3/uL (ref 19.0–186.0)
Retic Ct Pct: 1.8 % (ref 0.4–3.1)

## 2023-01-24 LAB — IRON AND IRON BINDING CAPACITY (CC-WL,HP ONLY)
Iron: 40 ug/dL (ref 28–170)
Saturation Ratios: 22 % (ref 10.4–31.8)
TIBC: 181 ug/dL — ABNORMAL LOW (ref 250–450)
UIBC: 141 ug/dL — ABNORMAL LOW (ref 148–442)

## 2023-01-24 LAB — PREALBUMIN: Prealbumin: 9 mg/dL — ABNORMAL LOW (ref 18–38)

## 2023-01-24 MED ORDER — DARBEPOETIN ALFA 300 MCG/0.6ML IJ SOSY
300.0000 ug | PREFILLED_SYRINGE | Freq: Once | INTRAMUSCULAR | Status: AC
  Administered 2023-01-24: 300 ug via SUBCUTANEOUS
  Filled 2023-01-24: qty 0.6

## 2023-01-24 NOTE — Progress Notes (Signed)
 Hematology and Oncology Follow Up Visit  [name] [name] [id number] September 11, 1924 89 y.o. 01/24/2023   Principle Diagnosis:  Iron deficiency anemia  Erythropoietin deficiency anemia  Adenocarcinoma of the sigmoid colon-localized   Current Therapy:        IV iron as indicated  Aranesp 300 mcg SQ to maintain Hgb > 11   Interim History:  [name] [name] is here today with her daughter for follow-up.  She is not feeling all that well.  She feels tired.  She does not have a lot of energy.  She says that her stools are soft and black.  Again, I wonder if she is not bleeding from this tumor that she has.  Of note, we did her CEA level today it was quite elevated at 142.  This is a lot higher than I would have thought.  She is had scans done which has not show any obvious metastatic disease.  However, I have to wonder if there is not some disease elsewhere.  We may have to get a PET scan on her to see if this may give [place] more information.  Again, I do not know if she would be a surgical candidate given her age and given this elevated CEA level.  Another problem is that we did do a prealbumin on her.  Her prealbumin is quite low.  Again, I wonder if this would preclude her from anything more aggressive with surgery as I would fear that she would never recover from surgery.  This is incredibly complicated.  It may be that we have to think about given her treatment which would certainly be very challenging given her age and her overall performance status.  She has not complained of any pain.  I think she seems to be eating okay.  Her weight is only 76 pounds.  Again, this is a problem.  She has had no cough.  She has had a little bit of leg swelling.  Her iron studies today showed saturation of 22%.  Overall, I would have said that her performance status is probably ECOG 3.   Wt Readings from Last 3 Encounters:  01/24/23 76 lb (34.5 kg)  01/03/23 74 lb (33.6 kg)  12/28/22 76 lb (34.5 kg)     Traps for each: Medications:  Allergies as of 01/24/2023       Reactions   Amlodipine Shortness Of Breath   Darifenacin Hydrobromide Other (See Comments)   dizziness   Sulfonamide Derivatives Other (See Comments)   dizziness   Other Other (See Comments)   dizziness   Tramadol Other (See Comments)   Insomnia, anorexia        Medication List        Accurate as of January 24, 2023  2:17 PM. If you have any questions, ask your nurse or doctor.          STOP taking these medications    cephALEXin 250 MG capsule Commonly known as: KEFLEX Stopped by: Josph Macho       TAKE these medications    ALPRAZolam 0.25 MG tablet Commonly known as: XANAX Take 0.5-1 tablets (0.125-0.25 mg total) by mouth 2 (two) times daily as needed for anxiety.   betamethasone dipropionate 0.05 % cream Apply 1 Application topically 2 (two) times daily.   famotidine 20 MG tablet Commonly known as: PEPCID Take 20 mg by mouth at bedtime.   Hemocyte-F 324-1 MG Tabs Generic drug: Ferrous Fumarate-Folic Acid Take 1 tablet by mouth daily.  hydrOXYzine 10 MG tablet Commonly known as: ATARAX Take 10 mg by mouth at bedtime as needed for itching.   isosorbide mononitrate 30 MG 24 hr tablet Commonly known as: IMDUR Take 1 tablet (30 mg total) by mouth daily.   levocetirizine 5 MG tablet Commonly known as: XYZAL Take 2.5 mg by mouth daily at 12 noon.   lipase/protease/amylase 14782 UNITS Cpep capsule Commonly known as: Creon Take 1 capsule (36,000 Units total) by mouth 3 (three) times daily with meals. May also take 1 capsule (36,000 Units total) as needed (with snacks - up to 4 snacks daily).   metoprolol succinate 25 MG 24 hr tablet Commonly known as: TOPROL-XL Take 1 tablet (25 mg total) by mouth daily.   ondansetron 4 MG tablet Commonly known as: ZOFRAN Take 1 tablet (4 mg total) by mouth every 6 (six) hours.   pantoprazole 40 MG tablet Commonly known as: PROTONIX TAKE 1  TABLET BY MOUTH EVERY DAY   predniSONE 10 MG tablet Commonly known as: DELTASONE Take 10 mg by mouth daily with breakfast.   Vitamin D 50 MCG (2000 UT) Caps Take 2,000 Units by mouth daily at 6 (six) AM.        Allergies:  Allergies  Allergen Reactions   Amlodipine Shortness Of Breath   Darifenacin Hydrobromide Other (See Comments)    dizziness   Sulfonamide Derivatives Other (See Comments)    dizziness   Other Other (See Comments)    dizziness   Tramadol Other (See Comments)    Insomnia, anorexia    Past Medical History, Surgical history, Social history, and Family History were reviewed and updated.  Review of Systems: Review of Systems  Constitutional:  Positive for malaise/fatigue.  HENT: Negative.    Eyes: Negative.   Respiratory: Negative.    Cardiovascular: Negative.   Gastrointestinal: Negative.   Genitourinary: Negative.   Musculoskeletal: Negative.   Skin: Negative.   Neurological: Negative.   Endo/Heme/Allergies: Negative.   Psychiatric/Behavioral: Negative.        Physical Exam:  height is 4\' 11"  (1.499 m) and weight is 76 lb (34.5 kg). Her oral temperature is 98 F (36.7 C). Her blood pressure is 117/56 (abnormal) and her pulse is 67. Her respiration is 16 and oxygen saturation is 100%.   Wt Readings from Last 3 Encounters:  01/24/23 76 lb (34.5 kg)  01/03/23 74 lb (33.6 kg)  12/28/22 76 lb (34.5 kg)    Physical Exam Vitals reviewed.  HENT:     Head: Normocephalic and atraumatic.  Eyes:     Pupils: Pupils are equal, round, and reactive to light.  Cardiovascular:     Rate and Rhythm: Normal rate and regular rhythm.     Heart sounds: Normal heart sounds.  Pulmonary:     Effort: Pulmonary effort is normal.     Breath sounds: Normal breath sounds.  Abdominal:     General: Bowel sounds are normal.     Palpations: Abdomen is soft.  Musculoskeletal:        General: No tenderness or deformity. Normal range of motion.     Cervical back:  Normal range of motion.  Lymphadenopathy:     Cervical: No cervical adenopathy.  Skin:    General: Skin is warm and dry.     Findings: No erythema or rash.  Neurological:     Mental Status: She is alert and oriented to person, place, and time.  Psychiatric:        Behavior: Behavior normal.  Thought Content: Thought content normal.        Judgment: Judgment normal.    Lab Results  Component Value Date   WBC 7.4 01/24/2023   HGB 9.6 (L) 01/24/2023   HCT 30.5 (L) 01/24/2023   MCV 96.5 01/24/2023   PLT 235 01/24/2023   Lab Results  Component Value Date   FERRITIN 89 01/03/2023   IRON 32 01/03/2023   TIBC 204 (L) 01/03/2023   UIBC 172 01/03/2023   IRONPCTSAT 16 01/03/2023   Lab Results  Component Value Date   RETICCTPCT 1.8 01/24/2023   RBC 3.16 (L) 01/24/2023   RBC 3.17 (L) 01/24/2023   No results found for: "KPAFRELGTCHN", "LAMBDASER", "KAPLAMBRATIO" No results found for: "IGGSERUM", "IGA", "IGMSERUM" No results found for: "TOTALPROTELP", "ALBUMINELP", "A1GS", "A2GS", "BETS", "BETA2SER", "GAMS", "MSPIKE", "SPEI"   Chemistry      Component Value Date/Time   NA 139 01/24/2023 1312   NA 139 04/11/2021 1312   K 4.6 01/24/2023 1312   CL 103 01/24/2023 1312   CO2 31 01/24/2023 1312   BUN 31 (H) 01/24/2023 1312   BUN 30 04/11/2021 1312   CREATININE 0.95 01/24/2023 1312   CREATININE 1.21 (H) 12/28/2022 1526      Component Value Date/Time   CALCIUM 8.7 (L) 01/24/2023 1312   ALKPHOS 15 (L) 01/24/2023 1312   AST 13 (L) 01/24/2023 1312   ALT 8 01/24/2023 1312   BILITOT 0.4 01/24/2023 1312     Impression and Plan: Ms. Mott is a very pleasant 89 yo caucasian female with multifactorial anemia (IDA and erythropoietin deficiency).  She now has colon cancer.  Given the elevated CEA, had believe that she has disease outside of the colon.  Again, I will have to believe that her goal here is somehow "quality of life.  She is only 76 pounds.  It is certainly conceivable  that we just may not be able to do anything for this problem.  Again her iron levels are better than I would have thought.  Her hemoglobin is holding steady.  She has a adequate erythropoietin level.  We are clearly going to have to follow this closely.  I am going to have to get her back I think in a week or so so we can come up with our "game plan" as to how we can try to manage all of her issues.  It is certainly a possibility that we may need to be looking at [facility] for her.   Josph Macho, MD 1/23/20252:17 PM

## 2023-01-24 NOTE — Patient Instructions (Signed)

## 2023-01-25 ENCOUNTER — Telehealth: Payer: Self-pay

## 2023-01-25 NOTE — Telephone Encounter (Signed)
Advised via MyChart.

## 2023-01-25 NOTE — Telephone Encounter (Signed)
-----   Message from Josph Macho sent at 01/24/2023  4:56 PM EST ----- Please call and let her know that the iron is actually not too bad.  It is actually better than I thought.  Cindee Lame

## 2023-01-29 ENCOUNTER — Inpatient Hospital Stay: Payer: Medicare Other

## 2023-01-29 ENCOUNTER — Inpatient Hospital Stay: Payer: Medicare Other | Admitting: Medical Oncology

## 2023-02-07 ENCOUNTER — Other Ambulatory Visit: Payer: Self-pay

## 2023-02-07 ENCOUNTER — Inpatient Hospital Stay: Payer: Medicare Other | Admitting: Hematology & Oncology

## 2023-02-07 ENCOUNTER — Encounter (HOSPITAL_COMMUNITY): Payer: Self-pay | Admitting: Student

## 2023-02-07 ENCOUNTER — Inpatient Hospital Stay: Payer: Medicare Other

## 2023-02-07 ENCOUNTER — Inpatient Hospital Stay: Payer: Medicare Other | Attending: Hematology & Oncology

## 2023-02-07 ENCOUNTER — Encounter: Payer: Self-pay | Admitting: Hematology & Oncology

## 2023-02-07 ENCOUNTER — Inpatient Hospital Stay (HOSPITAL_COMMUNITY)
Admission: EM | Admit: 2023-02-07 | Discharge: 2023-02-09 | DRG: 374 | Disposition: A | Discharge status: Home / Self Care | Payer: Medicare Other | Source: Ambulatory Visit | Attending: Internal Medicine | Admitting: Internal Medicine

## 2023-02-07 VITALS — BP 106/35 | HR 78 | Temp 98.4°F | Resp 20 | Ht 59.0 in

## 2023-02-07 DIAGNOSIS — K219 Gastro-esophageal reflux disease without esophagitis: Secondary | ICD-10-CM | POA: Diagnosis not present

## 2023-02-07 DIAGNOSIS — E039 Hypothyroidism, unspecified: Secondary | ICD-10-CM | POA: Diagnosis present

## 2023-02-07 DIAGNOSIS — D631 Anemia in chronic kidney disease: Secondary | ICD-10-CM | POA: Insufficient documentation

## 2023-02-07 DIAGNOSIS — Z8249 Family history of ischemic heart disease and other diseases of the circulatory system: Secondary | ICD-10-CM

## 2023-02-07 DIAGNOSIS — E785 Hyperlipidemia, unspecified: Secondary | ICD-10-CM | POA: Diagnosis not present

## 2023-02-07 DIAGNOSIS — Z85828 Personal history of other malignant neoplasm of skin: Secondary | ICD-10-CM

## 2023-02-07 DIAGNOSIS — N1831 Chronic kidney disease, stage 3a: Secondary | ICD-10-CM | POA: Insufficient documentation

## 2023-02-07 DIAGNOSIS — I080 Rheumatic disorders of both mitral and aortic valves: Secondary | ICD-10-CM | POA: Diagnosis present

## 2023-02-07 DIAGNOSIS — D509 Iron deficiency anemia, unspecified: Secondary | ICD-10-CM | POA: Diagnosis not present

## 2023-02-07 DIAGNOSIS — K625 Hemorrhage of anus and rectum: Secondary | ICD-10-CM

## 2023-02-07 DIAGNOSIS — K449 Diaphragmatic hernia without obstruction or gangrene: Secondary | ICD-10-CM | POA: Diagnosis not present

## 2023-02-07 DIAGNOSIS — Z825 Family history of asthma and other chronic lower respiratory diseases: Secondary | ICD-10-CM

## 2023-02-07 DIAGNOSIS — C187 Malignant neoplasm of sigmoid colon: Secondary | ICD-10-CM | POA: Insufficient documentation

## 2023-02-07 DIAGNOSIS — Z8261 Family history of arthritis: Secondary | ICD-10-CM | POA: Diagnosis not present

## 2023-02-07 DIAGNOSIS — Z87442 Personal history of urinary calculi: Secondary | ICD-10-CM

## 2023-02-07 DIAGNOSIS — K922 Gastrointestinal hemorrhage, unspecified: Secondary | ICD-10-CM | POA: Diagnosis not present

## 2023-02-07 DIAGNOSIS — Z66 Do not resuscitate: Secondary | ICD-10-CM | POA: Diagnosis present

## 2023-02-07 DIAGNOSIS — D649 Anemia, unspecified: Secondary | ICD-10-CM | POA: Diagnosis not present

## 2023-02-07 DIAGNOSIS — Z860100 Personal history of colon polyps, unspecified: Secondary | ICD-10-CM

## 2023-02-07 DIAGNOSIS — Z823 Family history of stroke: Secondary | ICD-10-CM

## 2023-02-07 DIAGNOSIS — M8580 Other specified disorders of bone density and structure, unspecified site: Secondary | ICD-10-CM | POA: Diagnosis present

## 2023-02-07 DIAGNOSIS — Z833 Family history of diabetes mellitus: Secondary | ICD-10-CM

## 2023-02-07 DIAGNOSIS — Z90711 Acquired absence of uterus with remaining cervical stump: Secondary | ICD-10-CM

## 2023-02-07 DIAGNOSIS — R64 Cachexia: Secondary | ICD-10-CM | POA: Insufficient documentation

## 2023-02-07 DIAGNOSIS — I4819 Other persistent atrial fibrillation: Secondary | ICD-10-CM | POA: Diagnosis present

## 2023-02-07 DIAGNOSIS — Z882 Allergy status to sulfonamides status: Secondary | ICD-10-CM

## 2023-02-07 DIAGNOSIS — C189 Malignant neoplasm of colon, unspecified: Secondary | ICD-10-CM | POA: Diagnosis not present

## 2023-02-07 DIAGNOSIS — Z7901 Long term (current) use of anticoagulants: Secondary | ICD-10-CM

## 2023-02-07 DIAGNOSIS — R627 Adult failure to thrive: Secondary | ICD-10-CM | POA: Diagnosis present

## 2023-02-07 DIAGNOSIS — C186 Malignant neoplasm of descending colon: Secondary | ICD-10-CM | POA: Diagnosis not present

## 2023-02-07 DIAGNOSIS — Z681 Body mass index (BMI) 19 or less, adult: Secondary | ICD-10-CM

## 2023-02-07 DIAGNOSIS — E43 Unspecified severe protein-calorie malnutrition: Secondary | ICD-10-CM | POA: Diagnosis not present

## 2023-02-07 DIAGNOSIS — Z885 Allergy status to narcotic agent status: Secondary | ICD-10-CM

## 2023-02-07 DIAGNOSIS — Z85038 Personal history of other malignant neoplasm of large intestine: Secondary | ICD-10-CM

## 2023-02-07 DIAGNOSIS — Z888 Allergy status to other drugs, medicaments and biological substances status: Secondary | ICD-10-CM | POA: Diagnosis not present

## 2023-02-07 DIAGNOSIS — R531 Weakness: Secondary | ICD-10-CM | POA: Diagnosis not present

## 2023-02-07 DIAGNOSIS — C182 Malignant neoplasm of ascending colon: Secondary | ICD-10-CM

## 2023-02-07 DIAGNOSIS — D630 Anemia in neoplastic disease: Secondary | ICD-10-CM | POA: Diagnosis not present

## 2023-02-07 DIAGNOSIS — R195 Other fecal abnormalities: Secondary | ICD-10-CM | POA: Diagnosis not present

## 2023-02-07 DIAGNOSIS — D500 Iron deficiency anemia secondary to blood loss (chronic): Secondary | ICD-10-CM

## 2023-02-07 DIAGNOSIS — I10 Essential (primary) hypertension: Secondary | ICD-10-CM | POA: Diagnosis present

## 2023-02-07 LAB — CBC WITH DIFFERENTIAL (CANCER CENTER ONLY)
Abs Immature Granulocytes: 0.03 10*3/uL (ref 0.00–0.07)
Basophils Absolute: 0 10*3/uL (ref 0.0–0.1)
Basophils Relative: 0 %
Eosinophils Absolute: 0 10*3/uL (ref 0.0–0.5)
Eosinophils Relative: 0 %
HCT: 17.5 % — ABNORMAL LOW (ref 36.0–46.0)
Hemoglobin: 5.2 g/dL — CL (ref 12.0–15.0)
Immature Granulocytes: 0 %
Lymphocytes Relative: 13 %
Lymphs Abs: 0.9 10*3/uL (ref 0.7–4.0)
MCH: 29.7 pg (ref 26.0–34.0)
MCHC: 29.7 g/dL — ABNORMAL LOW (ref 30.0–36.0)
MCV: 100 fL (ref 80.0–100.0)
Monocytes Absolute: 0.4 10*3/uL (ref 0.1–1.0)
Monocytes Relative: 5 %
Neutro Abs: 5.7 10*3/uL (ref 1.7–7.7)
Neutrophils Relative %: 82 %
Platelet Count: 282 10*3/uL (ref 150–400)
RBC: 1.75 MIL/uL — ABNORMAL LOW (ref 3.87–5.11)
RDW: 17.6 % — ABNORMAL HIGH (ref 11.5–15.5)
WBC Count: 7 10*3/uL (ref 4.0–10.5)
nRBC: 0 % (ref 0.0–0.2)

## 2023-02-07 LAB — RETICULOCYTES
Immature Retic Fract: 28.9 % — ABNORMAL HIGH (ref 2.3–15.9)
RBC.: 1.71 MIL/uL — ABNORMAL LOW (ref 3.87–5.11)
Retic Count, Absolute: 118.2 10*3/uL (ref 19.0–186.0)
Retic Ct Pct: 6.9 % — ABNORMAL HIGH (ref 0.4–3.1)

## 2023-02-07 LAB — CMP (CANCER CENTER ONLY)
ALT: 5 U/L (ref 0–44)
AST: 10 U/L — ABNORMAL LOW (ref 15–41)
Albumin: 2.8 g/dL — ABNORMAL LOW (ref 3.5–5.0)
Alkaline Phosphatase: 11 U/L — ABNORMAL LOW (ref 38–126)
Anion gap: 7 (ref 5–15)
BUN: 36 mg/dL — ABNORMAL HIGH (ref 8–23)
CO2: 27 mmol/L (ref 22–32)
Calcium: 8.3 mg/dL — ABNORMAL LOW (ref 8.9–10.3)
Chloride: 103 mmol/L (ref 98–111)
Creatinine: 1.12 mg/dL — ABNORMAL HIGH (ref 0.44–1.00)
GFR, Estimated: 45 mL/min — ABNORMAL LOW (ref >60)
Glucose, Bld: 120 mg/dL — ABNORMAL HIGH (ref 70–99)
Potassium: 4.8 mmol/L (ref 3.5–5.1)
Sodium: 137 mmol/L (ref 135–145)
Total Bilirubin: 0.2 mg/dL (ref 0.0–1.2)
Total Protein: 5.9 g/dL — ABNORMAL LOW (ref 6.5–8.1)

## 2023-02-07 LAB — CEA (ACCESS): CEA (CHCC): 149.41 ng/mL — ABNORMAL HIGH (ref 0.00–5.00)

## 2023-02-07 LAB — SAMPLE TO BLOOD BANK

## 2023-02-07 LAB — PREALBUMIN: Prealbumin: 12 mg/dL — ABNORMAL LOW (ref 18–38)

## 2023-02-07 LAB — PREPARE RBC (CROSSMATCH)

## 2023-02-07 LAB — FERRITIN: Ferritin: 58 ng/mL (ref 11–307)

## 2023-02-07 MED ORDER — HYDROXYZINE HCL 10 MG PO TABS
10.0000 mg | ORAL_TABLET | Freq: Every day | ORAL | Status: DC
  Administered 2023-02-08 (×2): 10 mg via ORAL
  Filled 2023-02-07 (×2): qty 1

## 2023-02-07 MED ORDER — SENNOSIDES-DOCUSATE SODIUM 8.6-50 MG PO TABS: 1.0000 | ORAL_TABLET | Freq: Every evening | ORAL | Status: DC | PRN

## 2023-02-07 MED ORDER — ONDANSETRON HCL 4 MG/2ML IJ SOLN: 4.0000 mg | Freq: Four times a day (QID) | INTRAMUSCULAR | Status: DC | PRN

## 2023-02-07 MED ORDER — ONDANSETRON HCL 4 MG PO TABS: 4.0000 mg | ORAL_TABLET | Freq: Four times a day (QID) | ORAL | Status: DC | PRN

## 2023-02-07 MED ORDER — ACETAMINOPHEN 325 MG PO TABS: 650.0000 mg | ORAL_TABLET | Freq: Four times a day (QID) | ORAL | Status: DC | PRN

## 2023-02-07 MED ORDER — PANTOPRAZOLE SODIUM 40 MG PO TBEC
40.0000 mg | DELAYED_RELEASE_TABLET | Freq: Every day | ORAL | Status: DC
  Administered 2023-02-08: 40 mg via ORAL
  Filled 2023-02-07: qty 1

## 2023-02-07 MED ORDER — SODIUM CHLORIDE 0.9% IV SOLUTION: Freq: Once | INTRAVENOUS | Status: AC

## 2023-02-07 MED ORDER — ISOSORBIDE MONONITRATE ER 30 MG PO TB24
30.0000 mg | ORAL_TABLET | Freq: Every day | ORAL | Status: DC
  Administered 2023-02-08 – 2023-02-09 (×2): 30 mg via ORAL
  Filled 2023-02-07 (×2): qty 1

## 2023-02-07 MED ORDER — METOPROLOL SUCCINATE ER 25 MG PO TB24
25.0000 mg | ORAL_TABLET | Freq: Every day | ORAL | Status: DC
  Administered 2023-02-08 – 2023-02-09 (×2): 25 mg via ORAL
  Filled 2023-02-07 (×2): qty 1

## 2023-02-07 MED ORDER — FAMOTIDINE 20 MG PO TABS
20.0000 mg | ORAL_TABLET | Freq: Every day | ORAL | Status: DC
Start: 2023-02-07 — End: 2023-02-09
  Administered 2023-02-08 (×2): 20 mg via ORAL
  Filled 2023-02-07 (×2): qty 1

## 2023-02-07 MED ORDER — ACETAMINOPHEN 650 MG RE SUPP: 650.0000 mg | Freq: Four times a day (QID) | RECTAL | Status: DC | PRN

## 2023-02-07 MED ORDER — VITAMIN D 25 MCG (1000 UNIT) PO TABS
2000.0000 [IU] | ORAL_TABLET | Freq: Every day | ORAL | Status: DC
  Administered 2023-02-08 – 2023-02-09 (×2): 2000 [IU] via ORAL
  Filled 2023-02-07 (×2): qty 2

## 2023-02-07 MED ORDER — BOOST / RESOURCE BREEZE PO LIQD CUSTOM
1.0000 | Freq: Three times a day (TID) | ORAL | Status: DC
  Administered 2023-02-08 – 2023-02-09 (×2): 1 via ORAL

## 2023-02-07 NOTE — H&P (Signed)
 History and Physical  [name] [name] FMW:[id number] DOB: 1924-12-08 DOA: 02/07/2023  PCP: Domenica Harlene LABOR, MD   Chief Complaint: Anemia, GI bleed, generalized weakness  HPI: [name] [name] is a 89 y.o. female with medical history significant for colon cancer, IDA, GERD, HTN, HLD, IBS, anxiety, A-fib and hypothyroidism who presented from her oncology office for evaluation after drop in hemoglobin to 5.2.  Patient reports she was diagnosed with colon cancer around Christmas eve.  Since then, she has had decreased energy and poor appetite.  She also reports she has had black tarry stools over the last few days with intermittent abdominal discomfort but denies any hematochezia, hemoptysis, dizziness, chest pain, shortness of breath or hematuria.  She continues to feel very weak and fatigued.  She was seen by her oncologist Dr. Timmy and lab work showed significant drop in hemoglobin from 9.62 weeks ago to 5.2.  She was sent directly to the ED for blood transfusion and further workup of her GI bleed.  ED Course: Vitals show slightly soft BP of 106/35 but otherwise stable.  1 unit of PRBC was ordered for patient.  TRH was consulted for admission.  Review of Systems: Please see HPI for pertinent positives and negatives. A complete 10 system review of systems are otherwise negative.  Past Medical History:  Diagnosis Date   Anemia    Anxiety    Arthritis    hips, knees   Colon cancer (HCC) 01/03/2023   Current use of long term anticoagulation    Diverticulitis of colon 02/06/2015   Gait difficulty    GERD (gastroesophageal reflux disease)    Headache(784.0) 06/22/2012   Hyperlipidemia    Hypertension    IBS (irritable bowel syndrome)    Incontinence    Loss of weight 06/05/2015   Medicare annual wellness visit, subsequent 02/05/2014   Melena    Mild mitral regurgitation    Mild tricuspid regurgitation    Mitral and aortic regurgitation    Mitral valve prolapse    hx of - not seen on  recent echoes   Osteopenia    Paroxysmal atrial flutter (HCC)    Pedal edema 10/01/2016   Persistent atrial fibrillation (HCC)    Personal history of colonic polyps    Renal lithiasis 06/26/2015   Sinus bradycardia    Thyroid  disease    hypo   Vitamin B12 deficiency 03/16/2016   Vitamin D  deficiency    Past Surgical History:  Procedure Laterality Date   BIOPSY  12/25/2022   Procedure: BIOPSY;  Surgeon: Stacia Glendia BRAVO, MD;  Location: [facility] ENDOSCOPY;  Service: Gastroenterology;;   BOTOX  INJECTION N/A 09/03/2012   Procedure: BOTOX  INJECTION;  Surgeon: Lamar JONETTA Aho, MD;  Location: [facility] ENDOSCOPY;  Service: Endoscopy;  Laterality: N/A;   BREAST BIOPSY Left    BREAST LUMPECTOMY Right 08/18/2014   Procedure: RIGHT BREAST LUMPECTOMY;  Surgeon: Vicenta Poli, MD;  Location: [facility];  Service: General;  Laterality: Right;   CARDIAC ELECTROPHYSIOLOGY MAPPING AND ABLATION     CATARACT EXTRACTION, BILATERAL     ESOPHAGOGASTRODUODENOSCOPY N/A 09/03/2012   Procedure: ESOPHAGOGASTRODUODENOSCOPY (EGD);  Surgeon: Lamar JONETTA Aho, MD;  Location: [facility];  Service: Endoscopy;  Laterality: N/A;   ESOPHAGOGASTRODUODENOSCOPY (EGD) WITH PROPOFOL  N/A 12/02/2018   Procedure: ESOPHAGOGASTRODUODENOSCOPY (EGD) WITH PROPOFOL ;  Surgeon: Legrand Victory LITTIE DOUGLAS, MD;  Location: [facility] ENDOSCOPY;  Service: Gastroenterology;  Laterality: N/A;   ESOPHAGOSCOPY W/ BOTOX  INJECTION     FLEXIBLE SIGMOIDOSCOPY N/A 12/25/2022   Procedure:  FLEXIBLE SIGMOIDOSCOPY;  Surgeon: Stacia Glendia BRAVO, MD;  Location[facility];  Service: Gastroenterology;  Laterality: N/A;   I & D EXTREMITY Right 11/06/2012   Procedure: IRRIGATION AND DEBRIDEMENT EXTREMITY Right Ring Finger;  Surgeon: Franky JONELLE Curia, MD;  Location: [facility] OR;  Service: Orthopedics;  Laterality: Right;   PARTIAL HYSTERECTOMY     ovaries left in place   skin cancer removal     Social History:  reports that she has never smoked. She has never used  smokeless tobacco. She reports that she does not drink alcohol and does not use drugs.  Allergies  Allergen Reactions   Amlodipine  Shortness Of Breath   Darifenacin Hydrobromide Other (See Comments)    dizziness   Sulfonamide Derivatives Other (See Comments)    dizziness   Other Other (See Comments)    dizziness   Tramadol  Other (See Comments)    Insomnia, anorexia    Family History  Problem Relation Age of Onset   Diabetes Mother    CVA Mother    Stroke Mother    COPD Father    Rheum arthritis Father    Allergies Daughter    COPD Daughter    Stroke Daughter    COPD Daughter        previous smoker   Stroke Maternal Grandfather    Heart disease Maternal Aunt    Heart disease Maternal Uncle    Colon cancer Neg Hx    Colon polyps Neg Hx    Esophageal cancer Neg Hx    Gallbladder disease Neg Hx    Kidney disease Neg Hx    Heart attack Neg Hx    Hypertension Neg Hx      Prior to Admission medications   Medication Sig Start Date End Date Taking? Authorizing Provider  acetaminophen  (TYLENOL ) 500 MG tablet Take 500 mg by mouth at bedtime as needed for mild pain (pain score 1-3) or moderate pain (pain score 4-6).   Yes [provider]  betamethasone dipropionate 0.05 % cream Apply 1 Application topically 2 (two) times daily. 12/09/22  Yes [provider]  Cholecalciferol  (VITAMIN D ) 2000 UNITS CAPS Take 2,000 Units by mouth daily at 6 (six) AM. 02/20/12  Yes Domenica Harlene LABOR, MD  famotidine  (PEPCID ) 20 MG tablet Take 20 mg by mouth at bedtime.   Yes [provider]  hydrOXYzine  (ATARAX ) 10 MG tablet Take 10 mg by mouth at bedtime. 10/15/22  Yes [provider]  isosorbide  mononitrate (IMDUR ) 30 MG 24 hr tablet Take 1 tablet (30 mg total) by mouth daily. Patient taking differently: Take 30 mg by mouth in the morning. 01/21/23  Yes Wyn Jackee VEAR Mickey., NP  metoprolol  succinate (TOPROL -XL) 25 MG 24 hr tablet Take 1 tablet (25 mg total) by mouth  daily. Patient taking differently: Take 25 mg by mouth in the morning. 10/25/22  Yes Domenica Harlene LABOR, MD  pantoprazole  (PROTONIX ) 40 MG tablet TAKE 1 TABLET BY MOUTH EVERY DAY Patient taking differently: Take 40 mg by mouth at bedtime. 09/17/22  Yes Domenica Harlene LABOR, MD  ALPRAZolam  (XANAX ) 0.25 MG tablet Take 0.5-1 tablets (0.125-0.25 mg total) by mouth 2 (two) times daily as needed for anxiety. Patient not taking: Reported on 02/07/2023 08/22/22   Almarie Birmingham B, NP  ondansetron  (ZOFRAN ) 4 MG tablet Take 1 tablet (4 mg total) by mouth every 6 (six) hours. Patient not taking: Reported on 02/07/2023 11/10/22   Jerral Meth, MD    Physical Exam: BP (!) 147/58 (  BP Location: Left Arm)   Pulse 72   Temp 98.1 F (36.7 C) (Oral)   Resp 18   Ht 4' 11 (1.499 m)   Wt 35 kg   SpO2 100%   BMI 15.58 kg/m  General: Pleasant, chronically ill, cachectic and frail elderly woman laying in bed. No acute distress. HEENT: [place]/AT. Anicteric sclera.  Pale conjunctivae. CV: RRR. No murmurs, rubs, or gallops. No LE edema Pulmonary: Lungs CTAB. Normal effort. No wheezing or rales. Abdominal: Soft, nontender, nondistended. Normal bowel sounds. Extremities: Palpable radial and DP pulses. Normal ROM. Skin: Warm and dry. No obvious rash or lesions. Neuro: A&Ox3. Moves all extremities. Normal sensation to light touch. No focal deficit. Psych: Normal mood and affect          Labs on Admission:  Basic Metabolic Panel: Recent Labs  Lab 02/07/23 1445  NA 137  K 4.8  CL 103  CO2 27  GLUCOSE 120*  BUN 36*  CREATININE 1.12*  CALCIUM  8.3*   Liver Function Tests: Recent Labs  Lab 02/07/23 1445  AST 10*  ALT 5  ALKPHOS 11*  BILITOT 0.2  PROT 5.9*  ALBUMIN 2.8*   No results for input(s): LIPASE, AMYLASE in the last 168 hours. No results for input(s): AMMONIA in the last 168 hours. CBC: Recent Labs  Lab 02/07/23 1445  WBC 7.0  NEUTROABS 5.7  HGB 5.2*  HCT 17.5*  MCV 100.0  PLT 282    Cardiac Enzymes: No results for input(s): CKTOTAL, CKMB, CKMBINDEX, TROPONINI in the last 168 hours. BNP (last 3 results) No results for input(s): BNP in the last 8760 hours.  ProBNP (last 3 results) No results for input(s): PROBNP in the last 8760 hours.  CBG: No results for input(s): GLUCAP in the last 168 hours.  Radiological Exams on Admission: No results found. Assessment/Plan [name] [name] is a 89 y.o. female with medical history significant for colon cancer, IDA, GERD, HTN, HLD, IBS, anxiety, A-fib and hypothyroidism who presented from her oncology office for evaluation after drop in hemoglobin to 5.2 and admitted for symptomatic anemia.  # Symptomatic anemia # GI bleed Elderly patient with recent diagnosis of colon cancer presenting with worsening fatigue, recent melena and found to have significant anemia on outpatient labs.  Patient dry on exam but hemodynamically stable.  Status post 1 unit PRBC.  She has had >4 point drop in her hemoglobin in the last 2 weeks.  Will likely require additional blood transfusions during this hospitalization. -GI consulted, appreciate recs -Start IV NS 75 cc/h -Follow-up posttransfusion H&H -Transfuse to keep Hgb >7 -Continue home Protonix  -Trend CBC  # Adenocarcinoma of the colon Diagnosed less than 2 months ago. Plan is to maintain quality of life without any aggressive interventions. Will treat symptomatically with plan to follow palliative care in the outpatient. -Her Oncologist, Dr. Timmy, following -CODE STATUS is DNR  # HTN BP elevated with SBP in the 130s to 150s. -Continue Imdur  and Toprol  XL  # A-fib Not on anticoagulation. HR stable in the 70s to 80s. -Continue Toprol  XL  # GERD -Continue Protonix  and famotidine   # Generalized weakness -PT/OT eval -Protein supplementation between meals -Continue vitamin supplementation   DVT prophylaxis: SCDs    Code Status: Limited: Do not attempt  resuscitation (DNR) -DNR-LIMITED -Do Not Intubate/DNI   Consults called: Gastroenterology  Family Communication: Discussed admission with 2 daughters at bedside  Severity of Illness: The appropriate patient status for this patient is INPATIENT. Inpatient status is judged  to be reasonable and necessary in order to provide the required intensity of service to ensure the patient's safety. The patient's presenting symptoms, physical exam findings, and initial radiographic and laboratory data in the context of their chronic comorbidities is felt to place them at high risk for further clinical deterioration. Furthermore, it is not anticipated that the patient will be medically stable for discharge from the hospital within 2 midnights of admission.   * I certify that at the point of admission it is my clinical judgment that the patient will require inpatient hospital care spanning beyond 2 midnights from the point of admission due to high intensity of service, high risk for further deterioration and high frequency of surveillance required.*  Level of care: Med-Surg   [name] Claretta HERO, MD 02/07/2023, 10:52 PM [facility] Pager: [phone] Isaiah 41:10   If 7PM-7AM, please contact night-coverage [email or web address] Password [email or web address]

## 2023-02-07 NOTE — ED Provider Notes (Signed)
 [place] EMERGENCY DEPARTMENT AT [facility] Provider Note   CSN: [id number] Arrival date & time: 02/07/23  1636     History Chief Complaint  Patient presents with   Weakness  HPI [name] [name] is a 89 y.o. female presenting for abnormal labs.  Received a call from the patient's oncologist that the patient had a hemoglobin of 5.5 and had been having dark stool.  Patient has a history of colon cancer and has had complications of bleeding in the past.  Patient's recorded medical, surgical, social, medication list and allergies were reviewed in the Snapshot window as part of the initial history.   Review of Systems   Review of Systems  Constitutional:  Negative for chills and fever.  HENT:  Negative for ear pain and sore throat.   Eyes:  Negative for pain and visual disturbance.  Respiratory:  Negative for cough and shortness of breath.   Cardiovascular:  Negative for chest pain and palpitations.  Gastrointestinal:  Positive for blood in stool. Negative for abdominal pain and vomiting.  Genitourinary:  Negative for dysuria and hematuria.  Musculoskeletal:  Negative for arthralgias and back pain.  Skin:  Negative for color change and rash.  Neurological:  Negative for seizures and syncope.  All other systems reviewed and are negative.   Physical Exam Updated Vital Signs BP (!) 147/58 (BP Location: Left Arm)   Pulse 72   Temp 98.1 F (36.7 C) (Oral)   Resp 18   Ht 4' 11 (1.499 m)   Wt 35 kg   SpO2 100%   BMI 15.58 kg/m  Physical Exam Vitals and nursing note reviewed.  Constitutional:      General: She is not in acute distress.    Appearance: She is well-developed.  HENT:     Head: Normocephalic and atraumatic.  Eyes:     Conjunctiva/sclera: Conjunctivae normal.  Cardiovascular:     Rate and Rhythm: Normal rate and regular rhythm.     Heart sounds: No murmur heard. Pulmonary:     Effort: Pulmonary effort is normal. No respiratory distress.     Breath  sounds: Normal breath sounds.  Abdominal:     General: There is no distension.     Palpations: Abdomen is soft.     Tenderness: There is no abdominal tenderness. There is no right CVA tenderness or left CVA tenderness.  Musculoskeletal:        General: No swelling or tenderness. Normal range of motion.     Cervical back: Neck supple.  Skin:    General: Skin is warm and dry.  Neurological:     General: No focal deficit present.     Mental Status: She is alert and oriented to person, place, and time. Mental status is at baseline.     Cranial Nerves: No cranial nerve deficit.      ED Course/ Medical Decision Making/ A&P    Procedures .Critical Care  Performed by: Jerral Meth, MD Authorized by: Jerral Meth, MD   Critical care provider statement:    Critical care time (minutes):  30   Critical care was necessary to treat or prevent imminent or life-threatening deterioration of the following conditions:  Circulatory failure   Critical care was time spent personally by me on the following activities:  Development of treatment plan with patient or surrogate, discussions with consultants, evaluation of patient's response to treatment, examination of patient, ordering and review of laboratory studies, ordering and review of radiographic studies, ordering and  performing treatments and interventions, pulse oximetry, re-evaluation of patient's condition and review of old charts   Care discussed with: admitting provider      Medications Ordered in ED Medications  acetaminophen  (TYLENOL ) tablet 650 mg (has no administration in time range)    Or  acetaminophen  (TYLENOL ) suppository 650 mg (has no administration in time range)  senna-docusate (Senokot-S) tablet 1 tablet (has no administration in time range)  ondansetron  (ZOFRAN ) tablet 4 mg (has no administration in time range)    Or  ondansetron  (ZOFRAN ) injection 4 mg (has no administration in time range)  cholecalciferol   (VITAMIN D3) 25 MCG (1000 UNIT) tablet 2,000 Units (has no administration in time range)  famotidine  (PEPCID ) tablet 20 mg (has no administration in time range)  hydrOXYzine  (ATARAX ) tablet 10 mg (has no administration in time range)  isosorbide  mononitrate (IMDUR ) 24 hr tablet 30 mg (has no administration in time range)  metoprolol  succinate (TOPROL -XL) 24 hr tablet 25 mg (has no administration in time range)  pantoprazole  (PROTONIX ) EC tablet 40 mg (has no administration in time range)  0.9 %  sodium chloride  infusion (Manually program via Guardrails IV Fluids) ( Intravenous New Bag/Given 02/07/23 1945)    Medical Decision Making:    [name] [name] is a 89 y.o. female who presented to the ED today with low hemoglobin and dark stools detailed above.      Reviewed patient's labs from clinic today that were consistent with acute anemia.  Patient consented to transfusion in the emergency room. Consulted gastroenterology provider recommendations.  They stated they would see the patient in the morning and agreed with transfusion and then observation overnight.  Patient consented to these therapies, reassessed frequently and in no acute distress. Family at bedside was updated on the plan.  Disposition:   Based on the above findings, I believe this patient is stable for admission.    Patient/family educated about specific findings on our evaluation and explained exact reasons for admission.  Patient/family educated about clinical situation and time was allowed to answer questions.   Admission team communicated with and agreed with need for admission. Patient admitted. Patient ready to move at this time.     Emergency Department Medication Summary:   Medications  acetaminophen  (TYLENOL ) tablet 650 mg (has no administration in time range)    Or  acetaminophen  (TYLENOL ) suppository 650 mg (has no administration in time range)  senna-docusate (Senokot-S) tablet 1 tablet (has no administration in  time range)  ondansetron  (ZOFRAN ) tablet 4 mg (has no administration in time range)    Or  ondansetron  (ZOFRAN ) injection 4 mg (has no administration in time range)  cholecalciferol  (VITAMIN D3) 25 MCG (1000 UNIT) tablet 2,000 Units (has no administration in time range)  famotidine  (PEPCID ) tablet 20 mg (has no administration in time range)  hydrOXYzine  (ATARAX ) tablet 10 mg (has no administration in time range)  isosorbide  mononitrate (IMDUR ) 24 hr tablet 30 mg (has no administration in time range)  metoprolol  succinate (TOPROL -XL) 24 hr tablet 25 mg (has no administration in time range)  pantoprazole  (PROTONIX ) EC tablet 40 mg (has no administration in time range)  0.9 %  sodium chloride  infusion (Manually program via Guardrails IV Fluids) ( Intravenous New Bag/Given 02/07/23 1945)         Clinical Impression: No diagnosis found.   Admit   Final Clinical Impression(s) / ED Diagnoses Final diagnoses:  None    Rx / DC Orders ED Discharge Orders     None  Jerral Meth, MD 02/07/23 [phone]

## 2023-02-07 NOTE — ED Triage Notes (Signed)
 Pt arrives to ED POV from doctors office co weakness and low hgb. Hx colon cancer and blood transfusion. AxOx4

## 2023-02-07 NOTE — ED Notes (Signed)
 Blood bank has blood ready for this patient.  Notified Debbi Failing, Charity fundraiser.

## 2023-02-07 NOTE — Progress Notes (Signed)
 Hematology and Oncology Follow Up Visit  [name] [name] [id number] 01/24/1924 89 y.o. 02/07/2023   Principle Diagnosis:  Iron  deficiency anemia  Erythropoietin  deficiency anemia  Adenocarcinoma of the sigmoid colon-localized   Current Therapy:        IV iron  as indicated  Aranesp  300 mcg SQ to maintain Hgb > 11   Interim History:  [name] [name] is here today with her daughters for follow-up.  Unfortunately, she really has declined.  Her hemoglobin is 5.2.  She is definitely bleeding from the tumor.  She has melena.  She is very weak.  She has had a lot of swelling in her legs.  I had a long talk with she and her daughters.  I explained to them that our options are basically to improve her quality of life.  I think that the way that we can best do this is to have her admitted so we can bring everybody to her to try to get this bleeding stopped.  Again, I am sure that she is bleeding from this tumor.  She is having some discomfort in the left abdomen.  I am sure this is where the tumor is.  Her CEA is over 140.  Her prealbumin is only 9.  Again I have to believe that she has locally advanced or possibly advanced disease.  I think that our goal clearly is her quality of life.  If we can get the bleeding stopped, she will feel better.  She would be a very good candidate for Hospice when she does leave the hospital.  She and her family would be agreeable to this.  I did talk to her about end-of-life issues.  She does not want to be kept alive artificially.  I totally agree with this.  I think if she were to go onto life support, she would never come off given her frail nature.  As such, she is a DO NOT RESUSCITATE.  I am not sure what the best way would be to try to get this bleeding stopped.  I would do not know if Gastroenterology can go up into the colon and possibly cauterize the bleeding.  I do not know if IR can do any kind of tumor embolization.  Another option would be  radiotherapy but I think this would certainly be a more challenging intervention for her.  She is not eating much.  Her weight is quite low.  She is not having much pain.  There is a little bit of discomfort in the left quadrant.  She has had no fever.  Overall, I would have said that her performance status is, at best, ECOG 3.   Wt Readings from Last 3 Encounters:  01/24/23 76 lb (34.5 kg)  01/03/23 74 lb (33.6 kg)  12/28/22 76 lb (34.5 kg)    Traps for each: Medications:  Allergies as of 02/07/2023       Reactions   Amlodipine  Shortness Of Breath   Darifenacin Hydrobromide Other (See Comments)   dizziness   Sulfonamide Derivatives Other (See Comments)   dizziness   Other Other (See Comments)   dizziness   Tramadol  Other (See Comments)   Insomnia, anorexia        Medication List        Accurate as of February 07, 2023  3:57 PM. If you have any questions, ask your nurse or doctor.          STOP taking these medications    predniSONE  10 MG  tablet Commonly known as: DELTASONE  Stopped by: Maude JONELLE Crease       TAKE these medications    ALPRAZolam  0.25 MG tablet Commonly known as: XANAX  Take 0.5-1 tablets (0.125-0.25 mg total) by mouth 2 (two) times daily as needed for anxiety.   betamethasone dipropionate 0.05 % cream Apply 1 Application topically 2 (two) times daily.   famotidine  20 MG tablet Commonly known as: PEPCID  Take 20 mg by mouth at bedtime.   Hemocyte-F 324-1 MG Tabs Generic drug: Ferrous Fumarate -Folic Acid  Take 1 tablet by mouth daily.   hydrOXYzine  10 MG tablet Commonly known as: ATARAX  Take 10 mg by mouth at bedtime as needed for itching.   isosorbide  mononitrate 30 MG 24 hr tablet Commonly known as: IMDUR  Take 1 tablet (30 mg total) by mouth daily.   levocetirizine 5 MG tablet Commonly known as: XYZAL  Take 2.5 mg by mouth daily at 12 noon.   lipase/protease/amylase 63999 UNITS Cpep capsule Commonly known as: Creon  Take 1  capsule (36,000 Units total) by mouth 3 (three) times daily with meals. May also take 1 capsule (36,000 Units total) as needed (with snacks - up to 4 snacks daily).   metoprolol  succinate 25 MG 24 hr tablet Commonly known as: TOPROL -XL Take 1 tablet (25 mg total) by mouth daily.   mupirocin ointment 2 % Commonly known as: BACTROBAN Apply 1 Application topically daily.   ondansetron  4 MG tablet Commonly known as: ZOFRAN  Take 1 tablet (4 mg total) by mouth every 6 (six) hours.   pantoprazole  40 MG tablet Commonly known as: PROTONIX  TAKE 1 TABLET BY MOUTH EVERY DAY   permethrin 5 % cream Commonly known as: ELIMITE Apply topically.   Vitamin D  50 MCG (2000 UT) Caps Take 2,000 Units by mouth daily at 6 (six) AM.        Allergies:  Allergies  Allergen Reactions   Amlodipine  Shortness Of Breath   Darifenacin Hydrobromide Other (See Comments)    dizziness   Sulfonamide Derivatives Other (See Comments)    dizziness   Other Other (See Comments)    dizziness   Tramadol  Other (See Comments)    Insomnia, anorexia    Past Medical History, Surgical history, Social history, and Family History were reviewed and updated.  Review of Systems: Review of Systems  Constitutional:  Positive for malaise/fatigue.  HENT: Negative.    Eyes: Negative.   Respiratory: Negative.    Cardiovascular: Negative.   Gastrointestinal: Negative.   Genitourinary: Negative.   Musculoskeletal: Negative.   Skin: Negative.   Neurological: Negative.   Endo/Heme/Allergies: Negative.   Psychiatric/Behavioral: Negative.        Physical Exam:  height is 4' 11 (1.499 m). Her oral temperature is 98.4 F (36.9 C). Her blood pressure is 106/35 (abnormal) and her pulse is 78. Her respiration is 20 and oxygen saturation is 100%.   Wt Readings from Last 3 Encounters:  01/24/23 76 lb (34.5 kg)  01/03/23 74 lb (33.6 kg)  12/28/22 76 lb (34.5 kg)    Physical Exam Vitals reviewed.  Constitutional:       Comments: This is a very elderly, thin white female.  She is alert.  HENT:     Head: Normocephalic and atraumatic.  Eyes:     Pupils: Pupils are equal, round, and reactive to light.  Cardiovascular:     Rate and Rhythm: Normal rate and regular rhythm.     Heart sounds: Normal heart sounds.  Pulmonary:     Effort: Pulmonary effort  is normal.     Breath sounds: Normal breath sounds.  Abdominal:     General: Bowel sounds are normal.     Palpations: Abdomen is soft.     Comments: Abdominal exam is soft.  She has decreased but active bowel sounds.  There is no fluid wave.  There is little bit of tenderness over in the left abdomen.  She has a little bit of fullness over in the left lower quadrant of the abdomen.  Musculoskeletal:        General: No tenderness or deformity. Normal range of motion.     Cervical back: Normal range of motion.     Comments: She has swelling in the lower legs.  She probably has about 2+ edema in the left leg and maybe 1+ edema in the right leg.  Lymphadenopathy:     Cervical: No cervical adenopathy.  Skin:    General: Skin is warm and dry.     Findings: No erythema or rash.  Neurological:     Mental Status: She is alert and oriented to person, place, and time.  Psychiatric:        Behavior: Behavior normal.        Thought Content: Thought content normal.        Judgment: Judgment normal.     Lab Results  Component Value Date   WBC 7.0 02/07/2023   HGB 5.2 (LL) 02/07/2023   HCT 17.5 (L) 02/07/2023   MCV 100.0 02/07/2023   PLT 282 02/07/2023   Lab Results  Component Value Date   FERRITIN 89 01/03/2023   IRON  40 01/24/2023   TIBC 181 (L) 01/24/2023   UIBC 141 (L) 01/24/2023   IRONPCTSAT 22 01/24/2023   Lab Results  Component Value Date   RETICCTPCT 6.9 (H) 02/07/2023   RBC 1.75 (L) 02/07/2023   RBC 1.71 (L) 02/07/2023   No results found for: KPAFRELGTCHN, LAMBDASER, KAPLAMBRATIO No results found for: IGGSERUM, IGA, IGMSERUM No  results found for: STEPHANY CARLOTA BENSON MARKEL EARLA JOANNIE DOC VICK, SPEI   Chemistry      Component Value Date/Time   NA 137 02/07/2023 1445   NA 139 04/11/2021 1312   K 4.8 02/07/2023 1445   CL 103 02/07/2023 1445   CO2 27 02/07/2023 1445   BUN 36 (H) 02/07/2023 1445   BUN 30 04/11/2021 1312   CREATININE 1.12 (H) 02/07/2023 1445   CREATININE 1.21 (H) 12/28/2022 1526      Component Value Date/Time   CALCIUM  8.3 (L) 02/07/2023 1445   ALKPHOS 11 (L) 02/07/2023 1445   AST 10 (L) 02/07/2023 1445   ALT 5 02/07/2023 1445   BILITOT 0.2 02/07/2023 1445     Impression and Plan: Ms. Siravo is a very pleasant 89 yo caucasian female with multifactorial anemia (IDA and erythropoietin  deficiency).  She now has colon cancer.  Given the elevated CEA, had believe that she has disease outside of the colon.  She clearly is bleeding from the tumor.  We are going to have to have her admitted so we can try to get this bleeding stopped.  Again, our goal is quality of life.  She understands and does not want any aggressive interventions.  Hopefully, we can get the bleeding stopped.  She will need to be transfused.  Sure she will need some IV fluids.  Hopefully, should be able to find a room over to [facility].  I know that they are full because of viral illnesses.  I  spoke to one of the ER docs to let him know that she is on her way over.    Maude JONELLE Crease, MD 2/6/20253:57 PM

## 2023-02-07 NOTE — Progress Notes (Signed)
 Critical result received from lab of hemoglobin 5.2 Dr. Maria Shiner aware and no new orders received. MD to assess patient.

## 2023-02-08 DIAGNOSIS — C186 Malignant neoplasm of descending colon: Secondary | ICD-10-CM

## 2023-02-08 DIAGNOSIS — C187 Malignant neoplasm of sigmoid colon: Secondary | ICD-10-CM

## 2023-02-08 DIAGNOSIS — E43 Unspecified severe protein-calorie malnutrition: Secondary | ICD-10-CM | POA: Diagnosis not present

## 2023-02-08 DIAGNOSIS — D649 Anemia, unspecified: Secondary | ICD-10-CM | POA: Diagnosis not present

## 2023-02-08 LAB — CBC
HCT: 23.9 % — ABNORMAL LOW (ref 36.0–46.0)
Hemoglobin: 7.4 g/dL — ABNORMAL LOW (ref 12.0–15.0)
MCH: 30.1 pg (ref 26.0–34.0)
MCHC: 31 g/dL (ref 30.0–36.0)
MCV: 97.2 fL (ref 80.0–100.0)
Platelets: 296 10*3/uL (ref 150–400)
RBC: 2.46 MIL/uL — ABNORMAL LOW (ref 3.87–5.11)
RDW: 16.9 % — ABNORMAL HIGH (ref 11.5–15.5)
WBC: 7.3 10*3/uL (ref 4.0–10.5)
nRBC: 0 % (ref 0.0–0.2)

## 2023-02-08 LAB — BASIC METABOLIC PANEL
Anion gap: 10 (ref 5–15)
BUN: 34 mg/dL — ABNORMAL HIGH (ref 8–23)
CO2: 23 mmol/L (ref 22–32)
Calcium: 8 mg/dL — ABNORMAL LOW (ref 8.9–10.3)
Chloride: 102 mmol/L (ref 98–111)
Creatinine, Ser: 0.91 mg/dL (ref 0.44–1.00)
GFR, Estimated: 57 mL/min — ABNORMAL LOW (ref >60)
Glucose, Bld: 93 mg/dL (ref 70–99)
Potassium: 4.3 mmol/L (ref 3.5–5.1)
Sodium: 135 mmol/L (ref 135–145)

## 2023-02-08 LAB — IRON AND IRON BINDING CAPACITY (CC-WL,HP ONLY)
Iron: 26 ug/dL — ABNORMAL LOW (ref 28–170)
Saturation Ratios: 10 % — ABNORMAL LOW (ref 10.4–31.8)
TIBC: 265 ug/dL (ref 250–450)
UIBC: 239 ug/dL (ref 148–442)

## 2023-02-08 LAB — HEMOGLOBIN AND HEMATOCRIT, BLOOD
HCT: 30.5 % — ABNORMAL LOW (ref 36.0–46.0)
Hemoglobin: 9.7 g/dL — ABNORMAL LOW (ref 12.0–15.0)

## 2023-02-08 LAB — PREPARE RBC (CROSSMATCH)

## 2023-02-08 LAB — VITAMIN B12: Vitamin B-12: 237 pg/mL (ref 180–914)

## 2023-02-08 MED ORDER — PANTOPRAZOLE SODIUM 40 MG PO TBEC
40.0000 mg | DELAYED_RELEASE_TABLET | Freq: Every day | ORAL | Status: DC
  Administered 2023-02-08 – 2023-02-09 (×2): 40 mg via ORAL
  Filled 2023-02-08 (×2): qty 1

## 2023-02-08 MED ORDER — SODIUM CHLORIDE 0.9 % IV SOLN
250.0000 mg | Freq: Every day | INTRAVENOUS | Status: DC
  Administered 2023-02-08 – 2023-02-09 (×2): 250 mg via INTRAVENOUS
  Filled 2023-02-08 (×2): qty 20

## 2023-02-08 MED ORDER — SODIUM CHLORIDE 0.9% IV SOLUTION: Freq: Once | INTRAVENOUS | Status: AC

## 2023-02-08 MED ORDER — FUROSEMIDE 10 MG/ML IJ SOLN
20.0000 mg | Freq: Once | INTRAMUSCULAR | Status: AC
  Administered 2023-02-08: 20 mg via INTRAVENOUS
  Filled 2023-02-08: qty 2

## 2023-02-08 MED ORDER — FUROSEMIDE 10 MG/ML IJ SOLN: 20.0000 mg | Freq: Once | INTRAMUSCULAR | Status: DC

## 2023-02-08 MED ORDER — SODIUM CHLORIDE 0.9 % IV SOLN: INTRAVENOUS | Status: AC

## 2023-02-08 NOTE — Progress Notes (Addendum)
 PROGRESS NOTE    [name] [name]  FMW:[id number] DOB: 1924-09-07 DOA: 02/07/2023 PCP: Domenica Harlene LABOR, MD   Brief Narrative:  [name] [name] is a 89 y.o. female with medical history significant for colon cancer, IDA, GERD, HTN, HLD, IBS, anxiety, A-fib and hypothyroidism who presented from her oncology office for evaluation after labs were remarkable for hemoglobin of 5.2 (previously 9.6).    Assessment & Plan:   Principal Problem:   Symptomatic anemia Active Problems:   Gastrointestinal hemorrhage  Symptomatic anemia Iron  deficiency, chronic Rule out GI bleed -GI, oncology following -Questionable episodes of melanotic stool vs dark stool complicated by recent diagnosis of colon cancer -Profoundly diminished iron , reticulocyte count elevated appropriately -IV iron /supplementation per oncology -Status post transfusion -hemoglobin improved drastically, 7.4 - would hold further transfusion given hemoglobin within normal limits without any notable symptoms and follow repeat CBC to verify if ongoing downtrend versus stabilization.  Adenocarcinoma of the colon -Recent diagnosis, plan to focus on quality of life without aggressive measures -Outpatient palliative care scheduled -Oncology following with Dr. Timmy  HTN - BP elevated with SBP in the 130s to 150s. - Continue Imdur  and Toprol  XL   A-fib, rate controlled -Not on anticoagulation -Rate controlled on metoprolol    GERD -Continue Protonix  and famotidine    Severe protein calorie malnutrition Generalized weakness -PT/OT eval -Protein supplementation between meals -Continue vitamin supplementation Body mass index is 15.58 kg/m.  DVT prophylaxis: SCDs Start: 02/07/23 2033 Code Status:   Code Status: Limited: Do not attempt resuscitation (DNR) -DNR-LIMITED -Do Not Intubate/DNI  Family Communication: Daughter x 2 at bedside  Status is: Inpatient  Dispo: The patient is from: Home              Anticipated d/c is to:  Home              Anticipated d/c date is: 24 to 48 hours              Patient currently not medically stable for discharge  Consultants:  GI, oncology  Procedures:  None  Antimicrobials:  None  Subjective: No acute issues or events overnight, denies nausea vomiting diarrhea constipation headache fevers chills chest pain shortness of breath.  No further episodes of melanotic stool since admission.  Objective: Vitals:   02/07/23 2200 02/07/23 2330 02/08/23 0000 02/08/23 0535  BP:  (!) 159/72 (!) 159/72 (!) 152/70  Pulse:  81 81 73  Resp:  18 18 18   Temp:  98.1 F (36.7 C) 98.1 F (36.7 C) 98.2 F (36.8 C)  TempSrc:  Oral    SpO2:   95% 96%  Weight: 35 kg     Height: 4' 11 (1.499 m)       Intake/Output Summary (Last 24 hours) at 02/08/2023 0709 Last data filed at 02/08/2023 0600 Gross per 24 hour  Intake 844.84 ml  Output --  Net 844.84 ml   Filed Weights   02/07/23 2200  Weight: 35 kg    Examination:  General:  Pleasantly resting in bed, No acute distress. HEENT:  Normocephalic atraumatic.  Sclerae nonicteric, noninjected.  Extraocular movements intact bilaterally. Neck:  Without mass or deformity.  Trachea is midline. Lungs:  Clear to auscultate bilaterally without rhonchi, wheeze, or rales. Heart:  Regular rate and rhythm.  Without murmurs, rubs, or gallops. Abdomen:  Soft, nontender, nondistended.  Without guarding or rebound. Extremities: Without cyanosis, clubbing, edema, or obvious deformity. Skin:  Warm and dry, no erythema.  Data Reviewed: I have personally  reviewed following labs and imaging studies  CBC: Recent Labs  Lab 02/07/23 1445 02/08/23 0321  WBC 7.0 7.3  NEUTROABS 5.7  --   HGB 5.2* 7.4*  HCT 17.5* 23.9*  MCV 100.0 97.2  PLT 282 296   Basic Metabolic Panel: Recent Labs  Lab 02/07/23 1445 02/08/23 0321  NA 137 135  K 4.8 4.3  CL 103 102  CO2 27 23  GLUCOSE 120* 93  BUN 36* 34*  CREATININE 1.12* 0.91  CALCIUM  8.3* 8.0*    GFR: Estimated Creatinine Clearance: 19.5 mL/min (by C-G formula based on SCr of 0.91 mg/dL). Liver Function Tests: Recent Labs  Lab 02/07/23 1445  AST 10*  ALT 5  ALKPHOS 11*  BILITOT 0.2  PROT 5.9*  ALBUMIN 2.8*   Anemia Panel: Recent Labs    02/07/23 1445  FERRITIN 58  RETICCTPCT 6.9*   No results found for this or any previous visit (from the past 240 hours).   Radiology Studies: No results found.  Scheduled Meds:  cholecalciferol   2,000 Units Oral Q0600   famotidine   20 mg Oral QHS   feeding supplement  1 Container Oral TID BM   hydrOXYzine   10 mg Oral QHS   isosorbide  mononitrate  30 mg Oral Daily   metoprolol  succinate  25 mg Oral Daily   pantoprazole   40 mg Oral QHS   Continuous Infusions:  sodium chloride  75 mL/hr at 02/08/23 0133     LOS: 1 day   Time spent:  Elsie JAYSON Montclair, DO [facility]  If 7PM-7AM, please contact night-coverage [email or web address]  02/08/2023, 7:09 AM

## 2023-02-08 NOTE — Progress Notes (Signed)
 Interventional Radiology Brief Note:  89 year old female with history of sigmoid colon cancer diagnosed 12/25/22 with severe symptomatic anemia admitted for same from Heme/Onc office with Hgb 5.5.   Patient admitted for blood transfusion and has received 2u PRBCs with improvement in Hgb to 7.4.  No noted signs of active bleeding.   She was evaluated by GI who reports brown stool, no active hemorrhage.  No plans for scope at this time.   Reviewed request for IR embolization procedure with Dr. Johann. In the absence of  active bleeding or demonstrable vascular pathology, no intervention recommended.   No plans for IR procedure at this time.   Ilsa Bonello, MS RD PA-C

## 2023-02-08 NOTE — Progress Notes (Addendum)
 [name] [name]   DOB:1924-01-04   FM#:[id number]      ASSESSMENT & PLAN:  1.  Anemia Iron  deficiency anemia Erythropoietin  deficiency anemia - Likely anemia is due to GI bleeding - CBC yesterday showed very low hemoglobin 5.2.  Status post PRBC transfusions.  Hemoglobin improved to 7.4.   - Recommend PRBC transfusions for Hgb <7.5. - IV iron /Ferrlecit x 3 doses ordered, day 1 of 3 today - Continue Aranesp  300 mcg as ordered - Continue to monitor CBC with differential - Medical oncology/Dr. Timmy following closely  2.  Colon cancer - CT scan done 12/24/2022 showed persistent 5.8 cm masslike soft tissue density involving sigmoid colon, suspicious for colon carcinoma.  No evidence of metastatic disease. Subsequently sigmoidoscopy done on 12/25/2022 which showed ulcerated partially obstructing large mass in the descending colon. Path confirmed invasive moderately differentiated adenocarcinoma.  - Elevated CEA 149.41 on 02/07/23. - Suspect patient is bleeding from tumor. GI to see patient and decide if can be cauterized OR IR tumor embolization. - Medical oncology/Dr. Timmy following closely  3.  Failure to thrive - Continue supportive care    Code Status DNR-limited   Subjective:  Patient seen awake and alert being assisted from bed to chair at bedside by physical therapist.  Currently receiving IV iron  infusion.  Multiple family members at bedside.  Reports that she feels okay with no acute complaints.  No acute distress is noted.  Objective:  Vitals:   02/08/23 0000 02/08/23 0535  BP: (!) 159/72 (!) 152/70  Pulse: 81 73  Resp: 18 18  Temp: 98.1 F (36.7 C) 98.2 F (36.8 C)  SpO2: 95% 96%     Intake/Output Summary (Last 24 hours) at 02/08/2023 9077 Last data filed at 02/08/2023 0600 Gross per 24 hour  Intake 844.84 ml  Output --  Net 844.84 ml     REVIEW OF SYSTEMS:   Constitutional: Denies fevers, chills or abnormal night sweats Eyes: Denies blurriness of vision,  double vision or watery eyes Ears, nose, mouth, throat, and face: Denies mucositis or sore throat Respiratory: Denies cough, dyspnea or wheezes Cardiovascular: Denies palpitation, chest discomfort or lower extremity swelling Gastrointestinal:  Denies nausea, heartburn or change in bowel habits Skin: Denies abnormal skin rashes Lymphatics: Denies new lymphadenopathy or easy bruising Neurological: Denies numbness, tingling or new weaknesses Behavioral/Psych: Mood is stable, no new changes  All other systems were reviewed with the patient and are negative.  PHYSICAL EXAMINATION: ECOG PERFORMANCE STATUS: 2 - Symptomatic, <50% confined to bed  Vitals:   02/08/23 0000 02/08/23 0535  BP: (!) 159/72 (!) 152/70  Pulse: 81 73  Resp: 18 18  Temp: 98.1 F (36.7 C) 98.2 F (36.8 C)  SpO2: 95% 96%   Filed Weights   02/07/23 2200  Weight: 77 lb 2.6 oz (35 kg)    GENERAL: alert, +frail appearing +cachectic  SKIN: +Pale skin color, texture, turgor are normal, no rashes or significant lesions EYES: normal, conjunctiva are pink and non-injected, sclera clear OROPHARYNX: no exudate, no erythema and lips, buccal mucosa, and tongue normal  NECK: supple, thyroid  normal size, non-tender, without nodularity LYMPH: no palpable lymphadenopathy in the cervical, axillary or inguinal LUNGS: clear to auscultation and percussion with normal breathing effort HEART: regular rate & rhythm and no murmurs and no lower extremity edema ABDOMEN: abdomen soft, non-tender and normal bowel sounds MUSCULOSKELETAL: +Cachectic PSYCH: alert & oriented x 3 with fluent speech NEURO: no focal motor/sensory deficits   All questions were answered. The patient  knows to call the clinic with any problems, questions or concerns.   The total time spent in the appointment was 40 minutes encounter with patient including review of chart and various tests results, discussions about plan of care and coordination of care plan  Olam JINNY Brunner, NP 02/08/2023 9:22 AM    Labs Reviewed:  Lab Results  Component Value Date   WBC 7.3 02/08/2023   HGB 7.4 (L) 02/08/2023   HCT 23.9 (L) 02/08/2023   MCV 97.2 02/08/2023   PLT 296 02/08/2023   Recent Labs    01/03/23 1403 01/24/23 1312 02/07/23 1445 02/08/23 0321  NA 138 139 137 135  K 4.4 4.6 4.8 4.3  CL 101 103 103 102  CO2 28 31 27 23   GLUCOSE 103* 109* 120* 93  BUN 31* 31* 36* 34*  CREATININE 1.08* 0.95 1.12* 0.91  CALCIUM  8.6* 8.7* 8.3* 8.0*  GFRNONAA 47* 54* 45* 57*  PROT 6.4* 5.8* 5.9*  --   ALBUMIN 3.2* 3.0* 2.8*  --   AST 10* 13* 10*  --   ALT 6 8 5   --   ALKPHOS 10* 15* 11*  --   BILITOT 0.3 0.4 0.2  --     Studies Reviewed:  No results found.   ADDENDUM: I saw and examined Ms. Ozburn this morning.  She has a marked anemia from the tumor.  Her active bleed this is where she is bleeding from.  I see that Gastroenterology is seeing her.  They do not recommend any endoscopic evaluation for this.  I really think that we have to try to get this bleeding stopped.  Maybe, we can see if IR will be able to do some kind of embolization.  Again I am not sure if there is a possibility.  I realize that she is not in the best shape in the world.  However, this is all about quality of life.  I know that Hospice will be called in when we get her out of the hospital.  I would like to make sure that when she goes home with hospice, that she is not going to run to problems with bleeding.  I know her iron  is low.  We will give her iron .  I do think she needs to have more blood.  We really need to get her blood count up.  I also suspect that she may benefit from some ESA.  I will see about adding this.  I know that she will get incredible care from everybody on [facility].  The staff are so compassionate and kind.   Jeralyn Crease, MD  Romans 8:28

## 2023-02-08 NOTE — Evaluation (Signed)
 Physical Therapy Evaluation Patient Details Name: [name] [name] MRN: [id number] DOB: 12/09/1924 Today's Date: 02/08/2023  History of Present Illness  89 y.o. female with a recent history of left-sided colon cancer (sigmoid on CT, descending on sigmoidoscopy) diagnosed 12/25/2022, admitted to the hospital with severe symptomatic anemia.   PMHx: anxiety, diverticulosis, HLD, HTN, CKD IIIb, a-fib, colon polyps, B12 deficiency, hypothyroid, esophageal diverticulum with achalasia s/p Botox  injection.  Clinical Impression  Pt admitted with above diagnosis.  Pt currently with functional limitations due to the deficits listed below (see PT Problem List). Pt will benefit from acute skilled PT to increase their independence and safety with mobility to allow discharge.  Pt agreeable for OOB to recliner today.  Pt only requiring CGA for transfers at this time.  Pt anticipates return home with family.  Pt and family appear agreeable for acute PT to assist with progressing activity as pt tolerates in anticipation of d/c home.          If plan is discharge home, recommend the following: A little help with walking and/or transfers;A little help with bathing/dressing/bathroom;Assistance with cooking/housework;Help with stairs or ramp for entrance   Can travel by private vehicle        Equipment Recommendations None recommended by PT  Recommendations for Other Services       Functional Status Assessment Patient has had a recent decline in their functional status and demonstrates the ability to make significant improvements in function in a reasonable and predictable amount of time.     Precautions / Restrictions Precautions Precautions: Fall      Mobility  Bed Mobility Overal bed mobility: Needs Assistance Bed Mobility: Supine to Sit     Supine to sit: Supervision, Used rails, HOB elevated          Transfers Overall transfer level: Needs assistance Equipment used: 1 person hand held  assist Transfers: Bed to chair/wheelchair/BSC, Sit to/from Stand Sit to Stand: Contact guard assist   Step pivot transfers: Contact guard assist       General transfer comment: CGA for safety, pt provided with HHA and utilized armrest of recliner to self support, no dizziness    Ambulation/Gait               General Gait Details: deferred today - pt agreeable to OOB to recliner, receiving iron  infusion currently  Stairs            Wheelchair Mobility     Tilt Bed    Modified Rankin (Stroke Patients Only)       Balance Overall balance assessment: Needs assistance         Standing balance support: Single extremity supported, During functional activity Standing balance-Leahy Scale: Poor                               Pertinent Vitals/Pain Pain Assessment Pain Assessment: No/denies pain    Home Living Family/patient expects to be discharged to:: Private residence Living Arrangements: Children Available Help at Discharge: Family   Home Access: Level entry       Home Layout: One level Home Equipment: Rollator (4 wheels);Shower seat;Grab bars - tub/shower;Hand held shower head      Prior Function Prior Level of Function : Needs assist             Mobility Comments: furniture walks at home, rollator when out ADLs Comments: completes all BADL's, help for driving, does cook breakfast  Extremity/Trunk Assessment        Lower Extremity Assessment Lower Extremity Assessment: Generalized weakness    Cervical / Trunk Assessment Cervical / Trunk Assessment: Kyphotic  Communication   Communication Communication: No apparent difficulties  Cognition Arousal: Alert Behavior During Therapy: WFL for tasks assessed/performed Overall Cognitive Status: Within Functional Limits for tasks assessed                                          General Comments      Exercises     Assessment/Plan    PT Assessment  Patient needs continued PT services  PT Problem List Decreased strength;Decreased activity tolerance;Decreased balance;Decreased mobility;Decreased knowledge of use of DME;Decreased skin integrity       PT Treatment Interventions DME instruction;Balance training;Gait training;Stair training;Functional mobility training;Therapeutic activities;Therapeutic exercise;Patient/family education    PT Goals (Current goals can be found in the Care Plan section)  Acute Rehab PT Goals PT Goal Formulation: With patient/family Time For Goal Achievement: 02/22/23 Potential to Achieve Goals: Good    Frequency Min 1X/week     Co-evaluation               AM-PAC PT 6 Clicks Mobility  Outcome Measure Help needed turning from your back to your side while in a flat bed without using bedrails?: A Little Help needed moving from lying on your back to sitting on the side of a flat bed without using bedrails?: A Little Help needed moving to and from a bed to a chair (including a wheelchair)?: A Little Help needed standing up from a chair using your arms (e.g., wheelchair or bedside chair)?: A Little Help needed to walk in hospital room?: A Little Help needed climbing 3-5 steps with a railing? : A Lot 6 Click Score: 17    End of Session   Activity Tolerance: Patient tolerated treatment well Patient left: with call bell/phone within reach;in chair;with chair alarm set;with family/visitor present Nurse Communication: Mobility status PT Visit Diagnosis: Difficulty in walking, not elsewhere classified (R26.2)    Time: 1047-1101 PT Time Calculation (min) (ACUTE ONLY): 14 min   Charges:   PT Evaluation $PT Eval Low Complexity: 1 Low   PT General Charges $$ ACUTE PT VISIT: 1 Visit        Tari KLEIN, DPT Physical Therapist Acute Rehabilitation Services Office: [phone]   Tari CROME [place] 02/08/2023, 12:48 PM

## 2023-02-08 NOTE — Consult Note (Addendum)
 Consultation  Referring Provider:     [facility] and Dr. Timmy Primary Care Physician:  Domenica Harlene LABOR, MD Primary Gastroenterologist:        Dr. Legrand Reason for Consultation:     Anemia in the setting of colon cancer question bleeding from tumor consider therapy     Impression / Plan:    Significant worsening of anemia in the setting of a left-sided colon cancer in a 89 year old woman who will appropriately not have surgery or other therapy for the cancer.  No good evidence for GI hemorrhage at this time though could have had it in the interval between oncology office visits.  She has not described hematochezia which is what I would have expected with a sigmoid or descending colon cancer.  Stool is brown right now.  I did not hemocculted as a positive Hemoccult is very likely and not helpful in decision process.  I am not recommending endoscopic evaluation for possible treatment given the current situation.  Would observe.  I think hospice makes sense for this lady.  Even if she were having signs to suggest bleeding from the tumor currently, endoscopic treatment is not effective beyond short-term at best.  Am rechecking B12 level to make sure that is still adequate though suspect it is.  Severe protein calorie malnutrition is also evident  Her diet may be advanced.  I will follow-up tomorrow.   [name] CHARLENA Commander, MD, [facility] See TRACEY on call - gastroenterology for best contact person 02/08/2023 9:45 AM           HPI:   [name] [name] is a 89 y.o. female with a left-sided colon cancer (sigmoid on CT, descending on sigmoidoscopy) diagnosed 12/25/2022, admitted to the hospital with severe symptomatic anemia.  She has been followed by Dr. Timmy since diagnosis, she was weak, fatigued, pale and had a hemoglobin of 5.2 yesterday and was admitted.  Hemoglobin was 9.3 on January 2 9.6 on January 23.  Her MCV is 100.  After transfusion of 2 units of red cells  hemoglobin is 7.4.  She reports black or dark stools for weeks prior to admission, no rectal bleeding or hematochezia.  She has some intermittent nausea, mild left-sided abdominal pain at times.  She is not having difficulty with defecation i.e. no significant constipation diarrhea.  She says her appetite is okay she is able to eat, she has chronic dysphagia with last EGD in December 2020 (Dr. Legrand) demonstrating a very large distal esophageal diverticulum, and a tortuous esophagus but nothing to suggest achalasia as was suspected by prior endoscopist.  There was a 3 cm hiatal hernia.  She denies the use of NSAIDs though does take occasional Tylenol .  Previous history of anticoagulation for A-fib that was stopped months ago.  She was on oral iron  but says she stopped that when she received parenteral iron  in the past few weeks.  Hemocyte was on her medication list at yesterday's visit with oncology.  CEA is 149 it was 142 2 weeks ago  Flexible sigmoidoscopy 12/25/2022 - Preparation of the colon was poor. - Rectal prolapse per vagina found on perianal exam. - Malignant partially obstructing tumor in the descending colon. Biopsied. - Moderate diverticulosis in the sigmoid colon and in the descending colon. - Stool in the sigmoid colon and in the descending colon. A. DESCENDING COLON, MASS, BIOPSY:  Invasive moderately differentiated adenocarcinoma  Tumor arises within a tubulovillous adenoma   EGD 12/02/2018 - Very large  diverticulum in the lower third of the esophagus containing food. This is causing the patient's progressive dysphagia and resultant weight loss. - Tortuous distal esophagus. - 3 cm hiatal hernia. - Normal stomach. - Normal examined duodenum. - No specimens collected.  CT abdomen and pelvis with with contrast 12/24/2022 IMPRESSION: Persistent 5.8 cm masslike soft tissue density involving the sigmoid colon, suspicious for colon carcinoma. Recommend further evaluation with  colonoscopy.   No evidence of metastatic disease.   Colonic diverticulosis, without radiographic evidence of diverticulitis.   Stable small to moderate hiatal hernia.    Past Medical History:  Diagnosis Date   Anemia    Anxiety    Arthritis    hips, knees   Colon cancer (HCC) 01/03/2023   Current use of long term anticoagulation    Diverticulitis of colon 02/06/2015   Gait difficulty    GERD (gastroesophageal reflux disease)    Headache(784.0) 06/22/2012   Hyperlipidemia    Hypertension    IBS (irritable bowel syndrome)    Incontinence    Loss of weight 06/05/2015   Medicare annual wellness visit, subsequent 02/05/2014   Melena    Mild mitral regurgitation    Mild tricuspid regurgitation    Mitral and aortic regurgitation    Mitral valve prolapse    hx of - not seen on recent echoes   Osteopenia    Paroxysmal atrial flutter (HCC)    Pedal edema 10/01/2016   Persistent atrial fibrillation (HCC)    Personal history of colonic polyps    Renal lithiasis 06/26/2015   Sinus bradycardia    Thyroid  disease    hypo   Vitamin B12 deficiency 03/16/2016   Vitamin D  deficiency     Past Surgical History:  Procedure Laterality Date   BIOPSY  12/25/2022   Procedure: BIOPSY;  Surgeon: Stacia Glendia BRAVO, MD;  Location: [facility] ENDOSCOPY;  Service: Gastroenterology;;   BOTOX  INJECTION N/A 09/03/2012   Procedure: BOTOX  INJECTION;  Surgeon: Lamar JONETTA Aho, MD;  Location: [facility] ENDOSCOPY;  Service: Endoscopy;  Laterality: N/A;   BREAST BIOPSY Left    BREAST LUMPECTOMY Right 08/18/2014   Procedure: RIGHT BREAST LUMPECTOMY;  Surgeon: Vicenta Poli, MD;  Location:  SURGERY CENTER;  Service: General;  Laterality: Right;   CARDIAC ELECTROPHYSIOLOGY MAPPING AND ABLATION     CATARACT EXTRACTION, BILATERAL     ESOPHAGOGASTRODUODENOSCOPY N/A 09/03/2012   Procedure: ESOPHAGOGASTRODUODENOSCOPY (EGD);  Surgeon: Lamar JONETTA Aho, MD;  Location: [facility];  Service: Endoscopy;   Laterality: N/A;   ESOPHAGOGASTRODUODENOSCOPY (EGD) WITH PROPOFOL  N/A 12/02/2018   Procedure: ESOPHAGOGASTRODUODENOSCOPY (EGD) WITH PROPOFOL ;  Surgeon: Legrand Victory LITTIE DOUGLAS, MD;  Location: [facility] ENDOSCOPY;  Service: Gastroenterology;  Laterality: N/A;   ESOPHAGOSCOPY W/ BOTOX  INJECTION     FLEXIBLE SIGMOIDOSCOPY N/A 12/25/2022   Procedure: FLEXIBLE SIGMOIDOSCOPY;  Surgeon: Stacia Glendia BRAVO, MD;  Location: [facility] ENDOSCOPY;  Service: Gastroenterology;  Laterality: N/A;   I & D EXTREMITY Right 11/06/2012   Procedure: IRRIGATION AND DEBRIDEMENT EXTREMITY Right Ring Finger;  Surgeon: Franky JONELLE Curia, MD;  Location: [facility] OR;  Service: Orthopedics;  Laterality: Right;   PARTIAL HYSTERECTOMY     ovaries left in place   skin cancer removal      Family History  Problem Relation Age of Onset   Diabetes Mother    CVA Mother    Stroke Mother    COPD Father    Rheum arthritis Father    Allergies Daughter    COPD Daughter    Stroke Daughter  COPD Daughter        previous smoker   Stroke Maternal Grandfather    Heart disease Maternal Aunt    Heart disease Maternal Uncle    Colon cancer Neg Hx    Colon polyps Neg Hx    Esophageal cancer Neg Hx    Gallbladder disease Neg Hx    Kidney disease Neg Hx    Heart attack Neg Hx    Hypertension Neg Hx     Social History   Tobacco Use   Smoking status: Never   Smokeless tobacco: Never  Vaping Use   Vaping status: Never Used  Substance Use Topics   Alcohol use: No   Drug use: No    Prior to Admission medications   Medication Sig Start Date End Date Taking? Authorizing Provider  acetaminophen  (TYLENOL ) 500 MG tablet Take 500 mg by mouth at bedtime as needed for mild pain (pain score 1-3) or moderate pain (pain score 4-6).   Yes [provider]  betamethasone dipropionate 0.05 % cream Apply 1 Application topically 2 (two) times daily. 12/09/22  Yes [provider]  Cholecalciferol  (VITAMIN D ) 2000 UNITS CAPS Take 2,000 Units by mouth  daily at 6 (six) AM. 02/20/12  Yes Domenica Harlene LABOR, MD  famotidine  (PEPCID ) 20 MG tablet Take 20 mg by mouth at bedtime.   Yes [provider]  hydrOXYzine  (ATARAX ) 10 MG tablet Take 10 mg by mouth at bedtime. 10/15/22  Yes [provider]  isosorbide  mononitrate (IMDUR ) 30 MG 24 hr tablet Take 1 tablet (30 mg total) by mouth daily. Patient taking differently: Take 30 mg by mouth in the morning. 01/21/23  Yes Wyn Jackee VEAR Mickey., NP  metoprolol  succinate (TOPROL -XL) 25 MG 24 hr tablet Take 1 tablet (25 mg total) by mouth daily. Patient taking differently: Take 25 mg by mouth in the morning. 10/25/22  Yes Domenica Harlene LABOR, MD  pantoprazole  (PROTONIX ) 40 MG tablet TAKE 1 TABLET BY MOUTH EVERY DAY Patient taking differently: Take 40 mg by mouth at bedtime. 09/17/22  Yes Domenica Harlene LABOR, MD  ALPRAZolam  (XANAX ) 0.25 MG tablet Take 0.5-1 tablets (0.125-0.25 mg total) by mouth 2 (two) times daily as needed for anxiety. Patient not taking: Reported on 02/07/2023 08/22/22   Almarie Waddell NOVAK, NP  ondansetron  (ZOFRAN ) 4 MG tablet Take 1 tablet (4 mg total) by mouth every 6 (six) hours. Patient not taking: Reported on 02/07/2023 11/10/22   Jerral Meth, MD    Current Facility-Administered Medications  Medication Dose Route Frequency Provider Last Rate Last Admin   0.9 %  sodium chloride  infusion (Manually program via Guardrails IV Fluids)   Intravenous Once Ennever, Peter R, MD       0.9 %  sodium chloride  infusion   Intravenous Continuous Lou Claretta HERO, MD 75 mL/hr at 02/08/23 0133 New Bag at 02/08/23 0133   acetaminophen  (TYLENOL ) tablet 650 mg  650 mg Oral Q6H PRN Amponsah, Prosper M, MD       Or   acetaminophen  (TYLENOL ) suppository 650 mg  650 mg Rectal Q6H PRN Lou Claretta HERO, MD       cholecalciferol  (VITAMIN D3) 25 MCG (1000 UNIT) tablet 2,000 Units  2,000 Units Oral Q0600 Lou Claretta HERO, MD   2,000 Units at 02/08/23 0536   famotidine  (PEPCID ) tablet 20 mg  20 mg Oral QHS  Amponsah, Prosper M, MD   20 mg at 02/08/23 0009   feeding supplement (BOOST / RESOURCE BREEZE) liquid 1 Container  1 Container Oral TID BM Lou Claretta HERO, MD   1 Container at 02/08/23 [phone]   ferric gluconate (FERRLECIT) 250 mg in sodium chloride  0.9 % 250 mL IVPB  250 mg Intravenous Daily Ennever, Peter R, MD       furosemide  (LASIX ) injection 20 mg  20 mg Intravenous Once Ennever, Peter R, MD       furosemide  (LASIX ) injection 20 mg  20 mg Intravenous Once Ennever, Peter R, MD       hydrOXYzine  (ATARAX ) tablet 10 mg  10 mg Oral QHS Amponsah, Prosper M, MD   10 mg at 02/08/23 0009   isosorbide  mononitrate (IMDUR ) 24 hr tablet 30 mg  30 mg Oral Daily Amponsah, Prosper M, MD   30 mg at 02/08/23 9071   metoprolol  succinate (TOPROL -XL) 24 hr tablet 25 mg  25 mg Oral Daily Amponsah, Prosper M, MD   25 mg at 02/08/23 9071   ondansetron  (ZOFRAN ) tablet 4 mg  4 mg Oral Q6H PRN Amponsah, Prosper M, MD       Or   ondansetron  (ZOFRAN ) injection 4 mg  4 mg Intravenous Q6H PRN Amponsah, Prosper M, MD       pantoprazole  (PROTONIX ) EC tablet 40 mg  40 mg Oral QHS Lou Claretta HERO, MD   40 mg at 02/08/23 0009   senna-docusate (Senokot-S) tablet 1 tablet  1 tablet Oral QHS PRN Lou Claretta HERO, MD        Allergies as of 02/07/2023 - Review Complete 02/07/2023  Allergen Reaction Noted   Amlodipine  Shortness Of Breath 08/30/2017   Darifenacin hydrobromide Other (See Comments) 02/06/2022   Sulfonamide derivatives Other (See Comments) 01/29/2007   Other Other (See Comments) 01/29/2007   Tramadol  Other (See Comments) 11/13/2010     Review of Systems:    This is positive for those things mentioned in the HPI, also positive for chronic bladder prolapse. All other review of systems are negative.       Physical Exam:  Vital signs in last 24 hours: Temp:  [98.1 F (36.7 C)-98.6 F (37 C)] 98.2 F (36.8 C) (02/07 0535) Pulse Rate:  [72-81] 73 (02/07 0535) Resp:  [15-20] 18 (02/07 0535) BP:  (106-159)/(35-97) 152/70 (02/07 0535) SpO2:  [95 %-100 %] 96 % (02/07 0535) Weight:  [35 kg] 35 kg (02/06 2200)    General:  Thin asthenic very pale elderly white woman in no acute distress.  There is muscle wasting. Eyes:  anicteric.  Pale conjunctiva ENT:   Mouth and posterior pharynx free of lesions.  Pale mucosa Lungs: Clear to auscultation bilaterally. Heart:   S1S2, no rubs, murmurs, gallops. Abdomen:  soft, non-tender, no hepatosplenomegaly, hernia, or mass and BS+.  Rectal: Performed with the presence of female nursing staff, there is a bladder prolapse through the vagina with slight bright red blood on the pad and on the prolapsed tissue with an area of superficial ulceration.  Digital rectal exam reveals dark brown stool.  There is no red blood or melena.  The posterior rectal wall is nodular.  There is no mass.   Extremities:   no edema there are small bruises on each lower extremity. Skin   no rash. Neuro:  A&O x 3.  Psych:  appropriate mood and  Affect.   Data Reviewed:   LAB RESULTS: Recent Labs    02/07/23 1445 02/08/23 0321  WBC 7.0 7.3  HGB 5.2* 7.4*  HCT 17.5* 23.9*  PLT 282 296   BMET Recent Labs  02/07/23 1445 02/08/23 0321  NA 137 135  K 4.8 4.3  CL 103 102  CO2 27 23  GLUCOSE 120* 93  BUN 36* 34*  CREATININE 1.12* 0.91  CALCIUM  8.3* 8.0*   LFT Recent Labs    02/07/23 1445  PROT 5.9*  ALBUMIN 2.8*  AST 10*  ALT 5  ALKPHOS 11*  BILITOT 0.2        Thanks   LOS: 1 day   @Rayann Jolley  CHARLENA Commander, MD, [facility] @  02/08/2023, 9:29 AM

## 2023-02-09 DIAGNOSIS — C189 Malignant neoplasm of colon, unspecified: Secondary | ICD-10-CM | POA: Diagnosis not present

## 2023-02-09 DIAGNOSIS — D649 Anemia, unspecified: Secondary | ICD-10-CM | POA: Diagnosis not present

## 2023-02-09 DIAGNOSIS — R195 Other fecal abnormalities: Secondary | ICD-10-CM

## 2023-02-09 LAB — COMPREHENSIVE METABOLIC PANEL
ALT: 7 U/L (ref 0–44)
AST: 14 U/L — ABNORMAL LOW (ref 15–41)
Albumin: 2.1 g/dL — ABNORMAL LOW (ref 3.5–5.0)
Alkaline Phosphatase: 9 U/L — ABNORMAL LOW (ref 38–126)
Anion gap: 8 (ref 5–15)
BUN: 22 mg/dL (ref 8–23)
CO2: 25 mmol/L (ref 22–32)
Calcium: 7.6 mg/dL — ABNORMAL LOW (ref 8.9–10.3)
Chloride: 102 mmol/L (ref 98–111)
Creatinine, Ser: 0.86 mg/dL (ref 0.44–1.00)
GFR, Estimated: >60 mL/min (ref >60)
Glucose, Bld: 82 mg/dL (ref 70–99)
Potassium: 3.7 mmol/L (ref 3.5–5.1)
Sodium: 135 mmol/L (ref 135–145)
Total Bilirubin: 0.8 mg/dL (ref 0.0–1.2)
Total Protein: 5.2 g/dL — ABNORMAL LOW (ref 6.5–8.1)

## 2023-02-09 LAB — CBC WITH DIFFERENTIAL/PLATELET
Abs Immature Granulocytes: 0.02 10*3/uL (ref 0.00–0.07)
Basophils Absolute: 0 10*3/uL (ref 0.0–0.1)
Basophils Relative: 0 %
Eosinophils Absolute: 0 10*3/uL (ref 0.0–0.5)
Eosinophils Relative: 1 %
HCT: 28.4 % — ABNORMAL LOW (ref 36.0–46.0)
Hemoglobin: 9 g/dL — ABNORMAL LOW (ref 12.0–15.0)
Immature Granulocytes: 0 %
Lymphocytes Relative: 18 %
Lymphs Abs: 0.9 10*3/uL (ref 0.7–4.0)
MCH: 30.5 pg (ref 26.0–34.0)
MCHC: 31.7 g/dL (ref 30.0–36.0)
MCV: 96.3 fL (ref 80.0–100.0)
Monocytes Absolute: 0.3 10*3/uL (ref 0.1–1.0)
Monocytes Relative: 6 %
Neutro Abs: 3.7 10*3/uL (ref 1.7–7.7)
Neutrophils Relative %: 75 %
Platelets: 281 10*3/uL (ref 150–400)
RBC: 2.95 MIL/uL — ABNORMAL LOW (ref 3.87–5.11)
RDW: 15.7 % — ABNORMAL HIGH (ref 11.5–15.5)
WBC: 4.9 10*3/uL (ref 4.0–10.5)
nRBC: 0 % (ref 0.0–0.2)

## 2023-02-09 LAB — TYPE AND SCREEN
ABO/RH(D): O POS
Antibody Screen: NEGATIVE

## 2023-02-09 LAB — OCCULT BLOOD X 1 CARD TO LAB, STOOL: Fecal Occult Bld: POSITIVE — AB

## 2023-02-09 LAB — PREALBUMIN: Prealbumin: 9 mg/dL — ABNORMAL LOW (ref 18–38)

## 2023-02-09 MED ORDER — FOLIC ACID 1 MG PO TABS
2.0000 mg | ORAL_TABLET | Freq: Every day | ORAL | Status: DC
  Administered 2023-02-09: 2 mg via ORAL
  Filled 2023-02-09: qty 2

## 2023-02-09 MED ORDER — FUROSEMIDE 10 MG/ML IJ SOLN: 20.0000 mg | Freq: Once | INTRAMUSCULAR | Status: DC

## 2023-02-09 MED ORDER — FOLIC ACID 1 MG PO TABS: 2.0000 mg | ORAL_TABLET | Freq: Every day | ORAL | 0 refills | Status: DC

## 2023-02-09 MED ORDER — SODIUM CHLORIDE 0.9% IV SOLUTION: Freq: Once | INTRAVENOUS | Status: DC

## 2023-02-09 NOTE — TOC Transition Note (Signed)
 Transition of Care [facility]) - Discharge Note   Patient Details  Name: [name] [name] MRN: [id number] Date of Birth: May 31, 1924  [facility] CM/SW Contact:  Bascom Service, RN Phone Number: 02/09/2023, 1:09 PM   Clinical Narrative:   d/c home    Final next level of care: Home/Self Care     Patient Goals and CMS Choice            Discharge Placement                       Discharge Plan and Services Additional resources added to the After Visit Summary for                                       Social Drivers of Health (SDOH) Interventions SDOH Screenings   Food Insecurity: No Food Insecurity (02/07/2023)  Housing: Low Risk  (02/07/2023)  Transportation Needs: No Transportation Needs (02/07/2023)  Utilities: Not At Risk (02/07/2023)  Alcohol Screen: Low Risk  (12/11/2021)  Depression (PHQ2-9): Low Risk  (05/17/2022)  Financial Resource Strain: Low Risk  (12/11/2021)  Physical Activity: Inactive (12/11/2021)  Social Connections: Socially Isolated (02/07/2023)  Stress: No Stress Concern Present (12/11/2021)  Tobacco Use: Low Risk  (02/07/2023)     Readmission Risk Interventions     No data to display

## 2023-02-09 NOTE — Progress Notes (Addendum)
   Patient Name: CATRIONA DILLENBECK Date of Encounter: 02/09/2023, 10:07 AM     Assessment and Plan  Decreased Hgb in setting of left colon cancer that will not be treated Dark stools I do not think she is having a hemorrhage - no role for endoscopic intervention  Going home per [facility] - agree She should enrol in hospice   Subjective  NAD    Objective  BP (!) 147/53 (BP Location: Right Arm)   Pulse 69   Temp 98.3 F (36.8 C) (Oral)   Resp 17   Ht 4' 11 (1.499 m)   Wt 35 kg   SpO2 97%   BMI 15.58 kg/m  Less pale   Recent Labs  Lab 02/07/23 1445 02/08/23 0321 02/08/23 1831 02/09/23 0332  HGB 5.2* 7.4* 9.7* 9.0*  HCT 17.5* 23.9* 30.5* 28.4*  WBC 7.0 7.3  --  4.9  PLT 282 296  --  281     [name], MD, [facility] Gastroenterology See AMION on call - gastroenterology for best contact person 02/09/2023 10:07 AM

## 2023-02-09 NOTE — Discharge Summary (Signed)
 Physician Discharge Summary  [name] [name] FMW:[id number] DOB: Jul 24, 1924 DOA: 02/07/2023  PCP: Domenica Harlene LABOR, MD  Admit date: 02/07/2023 Discharge date: 02/09/2023  Admitted From: Home Disposition: Home  Recommendations for Outpatient Follow-up:  Follow up with PCP in 1-2 weeks Follow-up with oncology as discussed in 1 to 2 weeks per their schedule  Home Health: None Equipment/Devices: None  Discharge Condition: Stable CODE STATUS: Full Diet recommendation: Regular diet as tolerated, multivitamin/protein supplementation as tolerated  Brief/Interim Summary: [name] [name] is a 89 y.o. female with medical history significant for colon cancer, IDA, GERD, HTN, HLD, IBS, anxiety, A-fib and hypothyroidism who presented from her oncology office for evaluation after labs were remarkable for hemoglobin of 5.2 (previously 9.6).   Patient admitted from outpatient office with acute anemia, status posttransfusion hemoglobin 9.0 near her baseline of 9.6.  She is currently receiving IV iron  as well due to profound iron  deficiencies.  Lengthy discussion today at bedside with patient and family, given resolution of hemoglobin patient wishes to return home for further outpatient follow-up with PCP and oncology.   Discharge Diagnoses:  Principal Problem:   Symptomatic anemia Active Problems:   Gastrointestinal hemorrhage  Symptomatic anemia, resolved Iron  deficiency, chronic Rule out GI bleed -GI, oncology following -Questionable episodes of melanotic stool vs dark stool complicated by recent diagnosis of colon cancer -Profoundly diminished iron , reticulocyte count elevated appropriately -IV iron /supplementation per oncology -Status post transfusion -hemoglobin improved drastically, 7.4 - Lengthy discussion with family/patient - hold further transfusion - complete IV iron  and will DC per their wishes for further outpatient management with Dr Timmy and the palliative care team given improvement  in hemoglobin to 9.   Adenocarcinoma of the colon -Recent diagnosis, plan to focus on quality of life without aggressive measures -Outpatient palliative care to follow along -Outpatient follow up with Dr. Timmy   HTN - BP elevated with SBP in the 130s to 150s. - Continue Imdur  and Toprol  XL   A-fib, rate controlled -Not on anticoagulation -Rate controlled on metoprolol    GERD -Continue Protonix  and famotidine    Severe protein calorie malnutrition Generalized weakness -PT/OT eval -Protein supplementation between meals -Continue vitamin supplementation Body mass index is 15.58 kg/m.  Discharge Instructions  Discharge Instructions     Call MD for:   Complete by: As directed    Bright red blood in stool   Call MD for:  difficulty breathing, headache or visual disturbances   Complete by: As directed    Call MD for:  extreme fatigue   Complete by: As directed    Call MD for:  persistant dizziness or light-headedness   Complete by: As directed    Call MD for:  persistant nausea and vomiting   Complete by: As directed    Call MD for:  severe uncontrolled pain   Complete by: As directed    Diet general   Complete by: As directed    Regular diet as tolerated.   Increase activity slowly   Complete by: As directed       Allergies as of 02/09/2023       Reactions   Amlodipine  Shortness Of Breath   Darifenacin Hydrobromide Other (See Comments)   dizziness   Sulfonamide Derivatives Other (See Comments)   dizziness   Other Other (See Comments)   dizziness   Tramadol  Other (See Comments)   Insomnia, anorexia        Medication List     STOP taking these medications    ALPRAZolam   0.25 MG tablet Commonly known as: XANAX        TAKE these medications    acetaminophen  500 MG tablet Commonly known as: TYLENOL  Take 500 mg by mouth at bedtime as needed for mild pain (pain score 1-3) or moderate pain (pain score 4-6).   betamethasone dipropionate 0.05 %  cream Apply 1 Application topically 2 (two) times daily.   famotidine  20 MG tablet Commonly known as: PEPCID  Take 20 mg by mouth at bedtime.   folic acid  1 MG tablet Commonly known as: FOLVITE  Take 2 tablets (2 mg total) by mouth daily. Start taking on: February 10, 2023   hydrOXYzine  10 MG tablet Commonly known as: ATARAX  Take 10 mg by mouth at bedtime.   isosorbide  mononitrate 30 MG 24 hr tablet Commonly known as: IMDUR  Take 1 tablet (30 mg total) by mouth daily. What changed: when to take this   metoprolol  succinate 25 MG 24 hr tablet Commonly known as: TOPROL -XL Take 1 tablet (25 mg total) by mouth daily. What changed: when to take this   ondansetron  4 MG tablet Commonly known as: ZOFRAN  Take 1 tablet (4 mg total) by mouth every 6 (six) hours.   pantoprazole  40 MG tablet Commonly known as: PROTONIX  TAKE 1 TABLET BY MOUTH EVERY DAY What changed: when to take this   Vitamin D  50 MCG (2000 UT) Caps Take 2,000 Units by mouth daily at 6 (six) AM.        Allergies  Allergen Reactions   Amlodipine  Shortness Of Breath   Darifenacin Hydrobromide Other (See Comments)    dizziness   Sulfonamide Derivatives Other (See Comments)    dizziness   Other Other (See Comments)    dizziness   Tramadol  Other (See Comments)    Insomnia, anorexia    Consultations: Oncology, GI   Procedures/Studies: No results found.   Subjective: No acute issues/events reported overnight    Discharge Exam: Vitals:   02/08/23 2149 02/09/23 0521  BP: (!) 134/57 (!) 147/53  Pulse: 64 69  Resp: 16 17  Temp: 98 F (36.7 C) 98.3 F (36.8 C)  SpO2: 97% 97%   Vitals:   02/08/23 1533 02/08/23 1731 02/08/23 2149 02/09/23 0521  BP: (!) 133/58 (!) 149/76 (!) 134/57 (!) 147/53  Pulse: 62 62 64 69  Resp: 16 15 16 17   Temp: 97.9 F (36.6 C) (!) 97.5 F (36.4 C) 98 F (36.7 C) 98.3 F (36.8 C)  TempSrc: Oral   Oral  SpO2: 100% 97% 97% 97%  Weight:      Height:        General:  Pt is alert, awake, not in acute distress Cardiovascular: RRR, S1/S2 +, no rubs, no gallops Respiratory: CTA bilaterally, no wheezing, no rhonchi Abdominal: Soft, NT, ND, bowel sounds + Extremities: no edema, no cyanosis    The results of significant diagnostics from this hospitalization (including imaging, microbiology, ancillary and laboratory) are listed below for reference.     Microbiology: No results found for this or any previous visit (from the past 240 hours).   Labs: BNP (last 3 results) No results for input(s): BNP in the last 8760 hours. Basic Metabolic Panel: Recent Labs  Lab 02/07/23 1445 02/08/23 0321 02/09/23 0332  NA 137 135 135  K 4.8 4.3 3.7  CL 103 102 102  CO2 27 23 25   GLUCOSE 120* 93 82  BUN 36* 34* 22  CREATININE 1.12* 0.91 0.86  CALCIUM  8.3* 8.0* 7.6*   Liver Function Tests: Recent Labs  Lab 02/07/23 1445 02/09/23 0332  AST 10* 14*  ALT 5 7  ALKPHOS 11* 9*  BILITOT 0.2 0.8  PROT 5.9* 5.2*  ALBUMIN 2.8* 2.1*   No results for input(s): LIPASE, AMYLASE in the last 168 hours. No results for input(s): AMMONIA in the last 168 hours. CBC: Recent Labs  Lab 02/07/23 1445 02/08/23 0321 02/08/23 1831 02/09/23 0332  WBC 7.0 7.3  --  4.9  NEUTROABS 5.7  --   --  3.7  HGB 5.2* 7.4* 9.7* 9.0*  HCT 17.5* 23.9* 30.5* 28.4*  MCV 100.0 97.2  --  96.3  PLT 282 296  --  281   Cardiac Enzymes: No results for input(s): CKTOTAL, CKMB, CKMBINDEX, TROPONINI in the last 168 hours. BNP: Invalid input(s): POCBNP CBG: No results for input(s): GLUCAP in the last 168 hours. D-Dimer No results for input(s): DDIMER in the last 72 hours. Hgb A1c No results for input(s): HGBA1C in the last 72 hours. Lipid Profile No results for input(s): CHOL, HDL, LDLCALC, TRIG, CHOLHDL, LDLDIRECT in the last 72 hours. Thyroid  function studies No results for input(s): TSH, T4TOTAL, T3FREE, THYROIDAB in the last 72  hours.  Invalid input(s): FREET3 Anemia work up Recent Labs    02/07/23 1445 02/08/23 0321  VITAMINB12  --  237  FERRITIN 58  --   TIBC 265  --   IRON  26*  --   RETICCTPCT 6.9*  --    Urinalysis    Component Value Date/Time   COLORURINE YELLOW 12/24/2022 1217   APPEARANCEUR CLOUDY (A) 12/24/2022 1217   LABSPEC 1.015 12/24/2022 1217   PHURINE 6.0 12/24/2022 1217   GLUCOSEU NEGATIVE 12/24/2022 1217   GLUCOSEU NEGATIVE 08/30/2017 1053   HGBUR TRACE (A) 12/24/2022 1217   HGBUR trace-intact 01/19/2009 0929   BILIRUBINUR NEGATIVE 12/24/2022 1217   BILIRUBINUR negative 02/01/2017 1039   KETONESUR NEGATIVE 12/24/2022 1217   PROTEINUR NEGATIVE 12/24/2022 1217   UROBILINOGEN 0.2 08/30/2017 1053   NITRITE NEGATIVE 12/24/2022 1217   LEUKOCYTESUR TRACE (A) 12/24/2022 1217   Sepsis Labs Recent Labs  Lab 02/07/23 1445 02/08/23 0321 02/09/23 0332  WBC 7.0 7.3 4.9   Microbiology No results found for this or any previous visit (from the past 240 hours).   Time coordinating discharge: Over 30 minutes  SIGNED:   Elsie JAYSON [place], DO [facility] 02/09/2023, 2:01 PM Pager   If 7PM-7AM, please contact night-coverage [email or web address]

## 2023-02-09 NOTE — Progress Notes (Signed)
 I appreciate everybody's help in trying to improve Ms. Kroboth's overall condition.  Unfortunately, IR does not think there is anything that they can do at this present time.  Gastroenterology does not think there is anything that they can do.  She says that there is still some blood when she goes to the bathroom..  She has been getting transfusions.  She has been getting iron .  I will add some folic acid .  Her labs today show a white cell count of 4.9.  Hemoglobin 9.  Platelet count 281,000.  Her albumin is 2.1.  Calcium  7.6.  BUN 22 creatinine 0.86.  There is no abdominal pain..  She is on a clear liquid diet.  We probably can increase his to a regular diet at the present time since there is no plan interventions.  I still would think that she needs 1 more unit of blood.  I still worry that she is going to have bleeding.  I realize that she is a Hospice candidate.  She will go into Hospice when she is discharged.  I will start her on some folic acid .  Hopefully this may help a little bit.  I still thought about the possibility of radiation therapy to the tumor mass to see if this may alleviate any bleeding.  Her vital signs are temperature 98.3.  Pulse 69.  Blood pressure 147/53.  Her abdominal exam is soft.  She has decent bowel sounds.  There is no fluid wave.  There is no palpable liver or spleen tip.  She does have a little bit of tenderness over on the left lower quadrant.  There may be a little bit of fullness in this area.  Extremity shows muscle atrophy in upper extremities.  She still has little bit of swelling in the legs which is chronic.  This is really a tough situation.  Again everything is about quality of life.  I really want to make sure that we focus on quality of life.  I have to believe that she is going to have bleeding again at some point.  For now, I would just watch her over the weekend to make sure that everything is stable, then hopefully we can let her go home.  We  really need to try to get her to have a little bit more to eat.  Hopefully, getting her on a regular diet will help.  I do appreciate everybody's help    Jeralyn Crease, MD  Romans 8:28

## 2023-02-09 NOTE — Evaluation (Signed)
 Occupational Therapy Evaluation Patient Details Name: [name] [name] MRN: [id number] DOB: Dec 26, 1924 Today's Date: 02/09/2023   History of Present Illness 89 y.o. female with a recent history of left-sided colon cancer (sigmoid on CT, descending on sigmoidoscopy) diagnosed 12/25/2022, admitted to the hospital with severe symptomatic anemia.   PMHx: anxiety, diverticulosis, HLD, HTN, CKD IIIb, a-fib, colon polyps, B12 deficiency, hypothyroid, esophageal diverticulum with achalasia s/p Botox  injection.   Clinical Impression   Pt currently at min guard assist for functional transfers and simulated LB selfcare without use of an assistive device.  Pt reports furniture walking at home with occasional use of the RW but has history of falls.  Feel she will benefit from acute care OT at this time to increase back to supervision/modified independent level for return home with her daughter.  No post acute OT needs at this time.  HR 64 BPM with O2 at 96% on room air post mobility.        If plan is discharge home, recommend the following: A little help with walking and/or transfers;A little help with bathing/dressing/bathroom;Assistance with cooking/housework;Assist for transportation;Help with stairs or ramp for entrance    Functional Status Assessment  Patient has had a recent decline in their functional status and demonstrates the ability to make significant improvements in function in a reasonable and predictable amount of time.  Equipment Recommendations  Other (comment) (family may purchase tub bench outside of the hospital)       Precautions / Restrictions Precautions Precautions: Fall Restrictions Weight Bearing Restrictions Per Provider Order: No      Mobility Bed Mobility Overal bed mobility: Needs Assistance Bed Mobility: Supine to Sit     Supine to sit: Supervision, Used rails, HOB elevated          Transfers Overall transfer level: Needs assistance   Transfers: Bed to  chair/wheelchair/BSC, Sit to/from Stand Sit to Stand: Contact guard assist     Step pivot transfers: Contact guard assist     General transfer comment: Contact guard for transfers without use of an assistive device.      Balance Overall balance assessment: Needs assistance Sitting-balance support: No upper extremity supported, Feet supported Sitting balance-Leahy Scale: Good     Standing balance support: During functional activity, No upper extremity supported Standing balance-Leahy Scale: Poor Standing balance comment: Pt with slight balance impairment noted with functional mobility.                           ADL either performed or assessed with clinical judgement   ADL Overall ADL's : Needs assistance/impaired Eating/Feeding: Independent;Sitting   Grooming: Wash/dry hands;Wash/dry face;Contact guard assist;Standing   Upper Body Bathing: Set up;Sitting   Lower Body Bathing: Contact guard assist;Sit to/from stand   Upper Body Dressing : Supervision/safety;Sitting   Lower Body Dressing: Contact guard assist;Sit to/from stand   Toilet Transfer: Contact guard assist;Comfort height toilet;Grab bars Toilet Transfer Details (indicate cue type and reason): simulated Toileting- Clothing Manipulation and Hygiene: Contact guard assist;Sit to/from stand       Functional mobility during ADLs: Contact guard assist (no assistive device in the hallway) General ADL Comments: Pt currently min guard for mobility without assistive device.  Pt reports having shower seat at home but she was able to step over holding grab bar.  Recommended shower bench with pt's daughter and she is in agreement and may purchase outside the hospital.  Recommend supervision for transfers in the shower if she  maintains using current setup.  Also, recommend use of the RW as pt furniture walker prior to this most of the time and does have history of recent falls.     Vision Baseline Vision/History: 1  Wears glasses Ability to See in Adequate Light: 0 Adequate Patient Visual Report: No change from baseline Vision Assessment?: No apparent visual deficits     Perception Perception: Not tested       Praxis Praxis: Not tested       Pertinent Vitals/Pain Pain Assessment Pain Assessment: No/denies pain     Extremity/Trunk Assessment Upper Extremity Assessment Upper Extremity Assessment: Overall WFL for tasks assessed   Lower Extremity Assessment Lower Extremity Assessment: Defer to PT evaluation   Cervical / Trunk Assessment Cervical / Trunk Assessment: Kyphotic   Communication Communication Communication: No apparent difficulties   Cognition Arousal: Alert Behavior During Therapy: WFL for tasks assessed/performed Overall Cognitive Status: Within Functional Limits for tasks assessed                                                  Home Living Family/patient expects to be discharged to:: Private residence Living Arrangements: Children Available Help at Discharge: Family   Home Access: Level entry     Home Layout: One level     Bathroom Shower/Tub: Chief Strategy Officer: Standard     Home Equipment: Rollator (4 wheels);Shower seat;Grab bars - tub/shower;Hand held shower head          Prior Functioning/Environment Prior Level of Function : Needs assist             Mobility Comments: furniture walks at home, rollator when out ADLs Comments: completes all BADL's, help for driving, does cook breakfast        OT Problem List: Impaired balance (sitting and/or standing);Decreased knowledge of use of DME or AE      OT Treatment/Interventions: Self-care/ADL training;Patient/family education;Balance training;Therapeutic activities;DME and/or AE instruction;Neuromuscular education    OT Goals(Current goals can be found in the care plan section) Acute Rehab OT Goals Patient Stated Goal: Pt did not state specifically but is  hopeful to go home later today. OT Goal Formulation: With patient/family Time For Goal Achievement: 02/23/23 Potential to Achieve Goals: Good  OT Frequency: Min 1X/week       AM-PAC OT 6 Clicks Daily Activity     Outcome Measure Help from another person eating meals?: None Help from another person taking care of personal grooming?: A Little Help from another person toileting, which includes using toliet, bedpan, or urinal?: A Little Help from another person bathing (including washing, rinsing, drying)?: A Little Help from another person to put on and taking off regular upper body clothing?: A Little Help from another person to put on and taking off regular lower body clothing?: A Little 6 Click Score: 19   End of Session Equipment Utilized During Treatment: Gait belt Nurse Communication: Mobility status  Activity Tolerance: Patient tolerated treatment well Patient left: in chair;with call bell/phone within reach;with chair alarm set  OT Visit Diagnosis: Unsteadiness on feet (R26.81);Other abnormalities of gait and mobility (R26.89);Repeated falls (R29.6);Muscle weakness (generalized) (M62.81)                Time: 9061-9040 OT Time Calculation (min): 21 min Charges:  OT General Charges $OT Visit: 1 Visit OT Evaluation $OT Eval  Moderate Complexity: 1 Mod  Lynwood Constant, OTR/L Acute Rehabilitation Services  Office [phone] 02/09/2023

## 2023-02-09 NOTE — Plan of Care (Signed)
  Problem: Education: Goal: Knowledge of General Education information will improve Description: Including pain rating scale, medication(s)/side effects and non-pharmacologic comfort measures Outcome: Adequate for Discharge   

## 2023-02-09 NOTE — Progress Notes (Signed)
 Advised per outgoing RN to hold 2nd unit of PRBC's and Lasix , if Hemoglobin greater than 8. Current Hemoglobin: 9.7. No orders found to match report given to hold either. Reached out to on-call NP Lorre Rosin) who advised okay to hold.

## 2023-02-11 ENCOUNTER — Telehealth: Payer: Self-pay

## 2023-02-11 LAB — TYPE AND SCREEN
ABO/RH(D): O POS
Antibody Screen: NEGATIVE
Unit division: 0
Unit division: 0
Unit division: 0

## 2023-02-11 LAB — BPAM RBC
Blood Product Expiration Date: 202503112359
Blood Product Expiration Date: 202503112359
Blood Product Expiration Date: 202503112359
ISSUE DATE / TIME: 202502061915
ISSUE DATE / TIME: 202502071238
Unit Type and Rh: 5100
Unit Type and Rh: 5100
Unit Type and Rh: 5100

## 2023-02-11 NOTE — Transitions of Care (Post Inpatient/ED Visit) (Signed)
 02/11/2023  Name: [name] [name] MRN: [id number] DOB: 11-07-1924  Today's TOC FU Call Status: Today's TOC FU Call Status:: Successful TOC FU Call Completed TOC FU Call Complete Date: 02/11/23 Patient's Name and Date of Birth confirmed.  Transition Care Management Follow-up Telephone Call Date of Discharge: 02/09/23 Discharge Facility: [facility]) Type of Discharge: Inpatient Admission Primary Inpatient Discharge Diagnosis:: hemrrhage of anus How have you been since you were released from the hospital?: Better Any questions or concerns?: No  Items Reviewed: Did you receive and understand the discharge instructions provided?: Yes Medications obtained,verified, and reconciled?: Yes (Medications Reviewed) Any new allergies since your discharge?: No Dietary orders reviewed?: Yes Do you have support at home?: Yes People in Home: child(ren), adult  Medications Reviewed Today: Medications Reviewed Today     Reviewed by Darrall Ellison, LPN (Licensed Practical Nurse) on 02/11/23 at 1142  Med List Status: <None>   Medication Order Taking? Sig Documenting Provider Last Dose Status Informant  acetaminophen  (TYLENOL ) 500 MG tablet [id number] No Take 500 mg by mouth at bedtime as needed for mild pain (pain score 1-3) or moderate pain (pain score 4-6). [provider] 02/06/2023 Active Self, Pharmacy Records, Child  betamethasone dipropionate 0.05 % cream [id number] No Apply 1 Application topically 2 (two) times daily. [provider] Past Week Active Self, Pharmacy Records, Child  Cholecalciferol  (VITAMIN D ) 2000 UNITS CAPS [id number] No Take 2,000 Units by mouth daily at 6 (six) AM. Neda Balk, MD 02/07/2023 Active Self, Pharmacy Records, Child  famotidine  (PEPCID ) 20 MG tablet [id number] No Take 20 mg by mouth at bedtime. [provider] Past Week Active Self, Pharmacy Records, Child  folic acid  (FOLVITE ) 1 MG tablet [id number]  Take 2 tablets (2 mg total) by  mouth daily. Haydee Lipa, MD  Active   hydrOXYzine  (ATARAX ) 10 MG tablet [id number] No Take 10 mg by mouth at bedtime. [provider] 02/06/2023 Active Self, Pharmacy Records, Child  isosorbide  mononitrate (IMDUR ) 30 MG 24 hr tablet [id number] No Take 1 tablet (30 mg total) by mouth daily.  Patient taking differently: Take 30 mg by mouth in the morning.   Gerald Kitty., NP 02/07/2023 Active Self, Pharmacy Records, Child  metoprolol  succinate (TOPROL -XL) 25 MG 24 hr tablet [id number] No Take 1 tablet (25 mg total) by mouth daily.  Patient taking differently: Take 25 mg by mouth in the morning.   Neda Balk, MD 02/07/2023 Active Self, Pharmacy Records, Child  ondansetron  (ZOFRAN ) 4 MG tablet [id number] No Take 1 tablet (4 mg total) by mouth every 6 (six) hours.  Patient not taking: Reported on 02/07/2023   Onetha Bile, MD Not Taking Active Self, Pharmacy Records, Child           Med Note Karolynn Pack   Thu Feb 07, 2023  8:36 PM)    pantoprazole  (PROTONIX ) 40 MG tablet [id number] No TAKE 1 TABLET BY MOUTH EVERY DAY  Patient taking differently: Take 40 mg by mouth at bedtime.   Neda Balk, MD Past Week Active Self, Pharmacy Records, Child            Home Care and Equipment/Supplies: Were Home Health Services Ordered?: NA Any new equipment or medical supplies ordered?: NA  Functional Questionnaire: Do you need assistance with bathing/showering or dressing?: No Do you need assistance with meal preparation?: No Do you need assistance with eating?: No Do you have difficulty maintaining continence: No Do you need assistance with getting out of  bed/getting out of a chair/moving?: No Do you have difficulty managing or taking your medications?: No  Follow up appointments reviewed: PCP Follow-up appointment confirmed?: No (no avai appt., sent message to staff to schedule) MD Provider Line [phone] Given: No Specialist Hospital Follow-up  appointment confirmed?: No Reason Specialist Follow-Up Not Confirmed: Patient has Specialist Provider Number and will Call for Appointment Do you need transportation to your follow-up appointment?: No Do you understand care options if your condition(s) worsen?: Yes-patient verbalized understanding    SIGNATURE Darrall Ellison, LPN [facility] Nurse Health Advisor Direct Dial [phone]

## 2023-02-13 ENCOUNTER — Inpatient Hospital Stay: Payer: Medicare Other | Admitting: Family Medicine

## 2023-02-15 ENCOUNTER — Telehealth: Payer: Self-pay | Admitting: Family Medicine

## 2023-02-15 ENCOUNTER — Encounter: Payer: Self-pay | Admitting: Physician Assistant

## 2023-02-15 ENCOUNTER — Ambulatory Visit: Payer: Medicare Other | Admitting: Physician Assistant

## 2023-02-15 VITALS — BP 112/72 | HR 89 | Temp 98.1°F | Resp 18 | Ht 59.0 in | Wt 74.0 lb

## 2023-02-15 DIAGNOSIS — D509 Iron deficiency anemia, unspecified: Secondary | ICD-10-CM | POA: Diagnosis not present

## 2023-02-15 DIAGNOSIS — C186 Malignant neoplasm of descending colon: Secondary | ICD-10-CM

## 2023-02-15 MED ORDER — OXYCODONE HCL 5 MG PO TABS: 2.5000 mg | ORAL_TABLET | Freq: Four times a day (QID) | ORAL | 0 refills | Status: AC | PRN

## 2023-02-15 NOTE — Progress Notes (Signed)
Established patient visit   Patient: [name] [name]   DOB: 04/09/1924   89 y.o. Female  MRN: [id number] Visit Date: 02/15/2023  Today's healthcare provider: Alfredia Ferguson, PA-C   Cc. Hospital f/u  Subjective     Pt was hospitalized from 2/6-02/09/23 for anenmia. She has a PMH of colon cancer, IDA, GERD, HTN, afib. She was found to have a hgb of 5.2 at her onocologist's office and referred to the ED  Post transfusion hgb 9.0, her baseline is 9.6. She was also given IV iron.  Pt reports overall feeling back to baseline from before she was in the hospital. Reports chronic swelling b/l LE, unchanged. Reports occasional heart palpitations. Denies chest pain, SOB. Reports some intermittent abdominal pain not controlled with tylenol. Last BM was this AM.  She presents today with her daughter.  Medications: Outpatient Medications Prior to Visit  Medication Sig   acetaminophen (TYLENOL) 500 MG tablet Take 500 mg by mouth at bedtime as needed for mild pain (pain score 1-3) or moderate pain (pain score 4-6).   betamethasone dipropionate 0.05 % cream Apply 1 Application topically 2 (two) times daily.   Cholecalciferol (VITAMIN D) 2000 UNITS CAPS Take 2,000 Units by mouth daily at 6 (six) AM.   famotidine (PEPCID) 20 MG tablet Take 20 mg by mouth at bedtime.   folic acid (FOLVITE) 1 MG tablet Take 2 tablets (2 mg total) by mouth daily.   hydrOXYzine (ATARAX) 10 MG tablet Take 10 mg by mouth at bedtime.   isosorbide mononitrate (IMDUR) 30 MG 24 hr tablet Take 1 tablet (30 mg total) by mouth daily. (Patient taking differently: Take 30 mg by mouth in the morning.)   metoprolol succinate (TOPROL-XL) 25 MG 24 hr tablet Take 1 tablet (25 mg total) by mouth daily. (Patient taking differently: Take 25 mg by mouth in the morning.)   ondansetron (ZOFRAN) 4 MG tablet Take 1 tablet (4 mg total) by mouth every 6 (six) hours.   pantoprazole (PROTONIX) 40 MG tablet TAKE 1 TABLET BY MOUTH EVERY DAY  (Patient taking differently: Take 40 mg by mouth at bedtime.)   No facility-administered medications prior to visit.    Review of Systems  Constitutional:  Negative for fatigue and fever.  Respiratory:  Negative for cough and shortness of breath.   Cardiovascular:  Positive for palpitations and leg swelling. Negative for chest pain.  Gastrointestinal:  Negative for abdominal pain.  Neurological:  Negative for dizziness and headaches.       Objective    BP 112/72 (BP Location: Left Arm, Patient Position: Sitting, Cuff Size: Normal)   Pulse 89   Temp 98.1 F (36.7 C) (Oral)   Resp 18   Ht 4\' 11"  (1.499 m)   Wt 74 lb (33.6 kg)   SpO2 98%   BMI 14.95 kg/m    Physical Exam Constitutional:      General: She is awake.     Appearance: Normal appearance. She is well-developed. She is not ill-appearing.  HENT:     Head: Normocephalic.  Eyes:     Conjunctiva/sclera: Conjunctivae normal.  Cardiovascular:     Rate and Rhythm: Normal rate and regular rhythm.     Heart sounds: Normal heart sounds.  Pulmonary:     Effort: Pulmonary effort is normal.     Breath sounds: Normal breath sounds.  Musculoskeletal:     Right lower leg: Edema present.     Left lower leg: Edema present.  Comments: 2+ pitting edema b/l LE  Skin:    General: Skin is warm.  Neurological:     Mental Status: She is alert and oriented to person, place, and time.  Psychiatric:        Attention and Perception: Attention normal.        Mood and Affect: Mood normal.        Speech: Speech normal.        Behavior: Behavior is cooperative.     No results found for any visits on 02/15/23.  Assessment & Plan    Iron deficiency anemia, unspecified iron deficiency anemia type -     CBC with Differential/Platelet  Malignant neoplasm of descending colon (HCC) -     oxyCODONE HCl; Take 0.5-1 tablets (2.5-5 mg total) by mouth every 6 (six) hours as needed for up to 5 days for severe pain (pain score 7-10).   Dispense: 20 tablet; Refill: 0  Pt appears overall well w/ good color.  Will repeat cbc for stability.  Reviewing oncology's last note, appears the plan is to set up for home hospice care In regard to her pain management-- will be taken over by hospice; unable to take tramadol, tylenol not controlling her pain.   Will rx 2.5 -5 mg oxycodone q 6 hours prn severe pain.  Return if symptoms worsen or fail to improve.      Alfredia Ferguson, PA-C  [facility] at [facility] [phone] (phone) [phone] (fax)  [facility]

## 2023-02-15 NOTE — Telephone Encounter (Signed)
Copied from CRM [id number]. Topic: General - Call Back - No Documentation >> Feb 15, 2023  2:55 PM Taleah C wrote: Reason for CRM: pt's daughter, Gigi Gin, called and stated that she missed a call from the clinic and was unsure if it could have been the nurse. I didn't see documentation. Please advise.

## 2023-02-15 NOTE — Telephone Encounter (Signed)
Called daughter back and instructions were given to her per provider's verbal order.

## 2023-02-16 LAB — CBC WITH DIFFERENTIAL/PLATELET
Absolute Lymphocytes: 634 {cells}/uL — ABNORMAL LOW (ref 850–3900)
Absolute Monocytes: 277 {cells}/uL (ref 200–950)
Basophils Absolute: 33 {cells}/uL (ref 0–200)
Basophils Relative: 0.5 %
Eosinophils Absolute: 33 {cells}/uL (ref 15–500)
Eosinophils Relative: 0.5 %
HCT: 26.1 % — ABNORMAL LOW (ref 35.0–45.0)
Hemoglobin: 8.2 g/dL — ABNORMAL LOW (ref 11.7–15.5)
MCH: 30.7 pg (ref 27.0–33.0)
MCHC: 31.4 g/dL — ABNORMAL LOW (ref 32.0–36.0)
MCV: 97.8 fL (ref 80.0–100.0)
MPV: 10.6 fL (ref 7.5–12.5)
Monocytes Relative: 4.2 %
Neutro Abs: 5623 {cells}/uL (ref 1500–7800)
Neutrophils Relative %: 85.2 %
Platelets: 278 10*3/uL (ref 140–400)
RBC: 2.67 10*6/uL — ABNORMAL LOW (ref 3.80–5.10)
RDW: 14.1 % (ref 11.0–15.0)
Total Lymphocyte: 9.6 %
WBC: 6.6 10*3/uL (ref 3.8–10.8)

## 2023-02-21 ENCOUNTER — Other Ambulatory Visit: Payer: Self-pay

## 2023-02-21 DIAGNOSIS — C186 Malignant neoplasm of descending colon: Secondary | ICD-10-CM

## 2023-02-25 ENCOUNTER — Other Ambulatory Visit: Payer: Self-pay | Admitting: *Deleted

## 2023-02-25 ENCOUNTER — Inpatient Hospital Stay: Payer: Medicare Other

## 2023-02-25 ENCOUNTER — Inpatient Hospital Stay (HOSPITAL_BASED_OUTPATIENT_CLINIC_OR_DEPARTMENT_OTHER): Payer: Medicare Other | Admitting: Hematology & Oncology

## 2023-02-25 ENCOUNTER — Encounter: Payer: Self-pay | Admitting: Hematology & Oncology

## 2023-02-25 VITALS — BP 107/45 | HR 75 | Temp 97.7°F | Resp 24

## 2023-02-25 VITALS — BP 147/58 | HR 73 | Temp 98.0°F | Resp 20

## 2023-02-25 DIAGNOSIS — D509 Iron deficiency anemia, unspecified: Secondary | ICD-10-CM | POA: Diagnosis not present

## 2023-02-25 DIAGNOSIS — R634 Abnormal weight loss: Secondary | ICD-10-CM

## 2023-02-25 DIAGNOSIS — R627 Adult failure to thrive: Secondary | ICD-10-CM

## 2023-02-25 DIAGNOSIS — D500 Iron deficiency anemia secondary to blood loss (chronic): Secondary | ICD-10-CM

## 2023-02-25 DIAGNOSIS — R64 Cachexia: Secondary | ICD-10-CM | POA: Diagnosis not present

## 2023-02-25 DIAGNOSIS — C186 Malignant neoplasm of descending colon: Secondary | ICD-10-CM

## 2023-02-25 DIAGNOSIS — R531 Weakness: Secondary | ICD-10-CM | POA: Diagnosis not present

## 2023-02-25 DIAGNOSIS — C187 Malignant neoplasm of sigmoid colon: Secondary | ICD-10-CM | POA: Diagnosis not present

## 2023-02-25 DIAGNOSIS — E538 Deficiency of other specified B group vitamins: Secondary | ICD-10-CM

## 2023-02-25 DIAGNOSIS — C182 Malignant neoplasm of ascending colon: Secondary | ICD-10-CM

## 2023-02-25 DIAGNOSIS — D631 Anemia in chronic kidney disease: Secondary | ICD-10-CM | POA: Diagnosis not present

## 2023-02-25 DIAGNOSIS — Z7901 Long term (current) use of anticoagulants: Secondary | ICD-10-CM

## 2023-02-25 DIAGNOSIS — N1831 Chronic kidney disease, stage 3a: Secondary | ICD-10-CM | POA: Diagnosis not present

## 2023-02-25 DIAGNOSIS — R54 Age-related physical debility: Secondary | ICD-10-CM

## 2023-02-25 DIAGNOSIS — E43 Unspecified severe protein-calorie malnutrition: Secondary | ICD-10-CM | POA: Diagnosis not present

## 2023-02-25 LAB — CMP (CANCER CENTER ONLY)
ALT: 6 U/L (ref 0–44)
AST: 13 U/L — ABNORMAL LOW (ref 15–41)
Albumin: 2.6 g/dL — ABNORMAL LOW (ref 3.5–5.0)
Alkaline Phosphatase: 10 U/L — ABNORMAL LOW (ref 38–126)
Anion gap: 9 (ref 5–15)
BUN: 30 mg/dL — ABNORMAL HIGH (ref 8–23)
CO2: 26 mmol/L (ref 22–32)
Calcium: 8.2 mg/dL — ABNORMAL LOW (ref 8.9–10.3)
Chloride: 103 mmol/L (ref 98–111)
Creatinine: 0.95 mg/dL (ref 0.44–1.00)
GFR, Estimated: 54 mL/min — ABNORMAL LOW (ref >60)
Glucose, Bld: 122 mg/dL — ABNORMAL HIGH (ref 70–99)
Potassium: 3.9 mmol/L (ref 3.5–5.1)
Sodium: 138 mmol/L (ref 135–145)
Total Bilirubin: 0.3 mg/dL (ref 0.0–1.2)
Total Protein: 5.3 g/dL — ABNORMAL LOW (ref 6.5–8.1)

## 2023-02-25 LAB — SAMPLE TO BLOOD BANK

## 2023-02-25 LAB — CBC WITH DIFFERENTIAL (CANCER CENTER ONLY)
Abs Immature Granulocytes: 0.05 10*3/uL (ref 0.00–0.07)
Basophils Absolute: 0 10*3/uL (ref 0.0–0.1)
Basophils Relative: 0 %
Eosinophils Absolute: 0 10*3/uL (ref 0.0–0.5)
Eosinophils Relative: 0 %
HCT: 21.3 % — ABNORMAL LOW (ref 36.0–46.0)
Hemoglobin: 6.6 g/dL — CL (ref 12.0–15.0)
Immature Granulocytes: 1 %
Lymphocytes Relative: 12 %
Lymphs Abs: 0.7 10*3/uL (ref 0.7–4.0)
MCH: 31.3 pg (ref 26.0–34.0)
MCHC: 31 g/dL (ref 30.0–36.0)
MCV: 100.9 fL — ABNORMAL HIGH (ref 80.0–100.0)
Monocytes Absolute: 0.3 10*3/uL (ref 0.1–1.0)
Monocytes Relative: 6 %
Neutro Abs: 4.6 10*3/uL (ref 1.7–7.7)
Neutrophils Relative %: 81 %
Platelet Count: 300 10*3/uL (ref 150–400)
RBC: 2.11 MIL/uL — ABNORMAL LOW (ref 3.87–5.11)
RDW: 17.2 % — ABNORMAL HIGH (ref 11.5–15.5)
WBC Count: 5.7 10*3/uL (ref 4.0–10.5)
nRBC: 0 % (ref 0.0–0.2)

## 2023-02-25 LAB — PREPARE RBC (CROSSMATCH)

## 2023-02-25 MED ORDER — ACETAMINOPHEN 325 MG PO TABS
650.0000 mg | ORAL_TABLET | Freq: Once | ORAL | Status: AC
  Administered 2023-02-25: 650 mg via ORAL
  Filled 2023-02-25: qty 2

## 2023-02-25 MED ORDER — IRON SUCROSE 20 MG/ML IV SOLN
200.0000 mg | Freq: Once | INTRAVENOUS | Status: AC
  Administered 2023-02-25: 200 mg via INTRAVENOUS
  Filled 2023-02-25: qty 10

## 2023-02-25 MED ORDER — CYANOCOBALAMIN 1000 MCG/ML IJ SOLN: 1000.0000 ug | Freq: Once | INTRAMUSCULAR | Status: DC

## 2023-02-25 MED ORDER — SODIUM CHLORIDE 0.9% IV SOLUTION
250.0000 mL | INTRAVENOUS | Status: DC
Start: 2023-02-25 — End: 2023-02-25

## 2023-02-25 MED ORDER — SODIUM CHLORIDE 0.9 % IV SOLN: INTRAVENOUS | Status: DC

## 2023-02-25 MED ORDER — CYANOCOBALAMIN 1000 MCG/ML IJ SOLN
1000.0000 ug | Freq: Once | INTRAMUSCULAR | Status: AC
  Administered 2023-02-25: 1000 ug via INTRAMUSCULAR
  Filled 2023-02-25: qty 1

## 2023-02-25 NOTE — Progress Notes (Signed)
 Hematology and Oncology Follow Up Visit  [name] [name] [id number] 04/07/1924 89 y.o. 02/25/2023   Principle Diagnosis:  Iron deficiency anemia  Erythropoietin deficiency anemia  Adenocarcinoma of the sigmoid colon-localized   Current Therapy:        IV iron as indicated  Aranesp 300 mcg SQ to maintain Hgb > 11   Interim History:  [name] [name] is here today with her daughters for follow-up.  Her decline continues.  Unfortunately, so does her hemoglobin.  Again unsure if she is bleeding from the tumor.  Her hemoglobin is 6.6.  She is not yet on Hospice.  I think she really needs to go onto Hospice.  I know that her family is worried that if she wanted Hospice as she would not be able to get blood transfusions.  Her appetite is down.  I am sure her weight is down quite a bit.  I do not think she was weighed today because of her weakness.  She is not having any pain.  Maybe occasional pain over on the left lower quadrant of the abdomen.  She has had no nausea or vomiting.  Her appetite is quite down.  She just does not feel like eating.  Currently, I would have to say that her performance status is probably ECOG 3 at best.      Wt Readings from Last 3 Encounters:  02/15/23 74 lb (33.6 kg)  02/07/23 77 lb 2.6 oz (35 kg)  01/24/23 76 lb (34.5 kg)    Traps for each: Medications:  Allergies as of 02/25/2023       Reactions   Amlodipine Shortness Of Breath   Darifenacin Hydrobromide Other (See Comments)   dizziness   Sulfonamide Derivatives Other (See Comments)   dizziness   Other Other (See Comments)   dizziness   Tramadol Other (See Comments)   Insomnia, anorexia        Medication List        Accurate as of February 25, 2023  2:18 PM. If you have any questions, ask your nurse or doctor.          acetaminophen 500 MG tablet Commonly known as: TYLENOL Take 500 mg by mouth at bedtime as needed for mild pain (pain score 1-3) or moderate pain (pain score 4-6).    betamethasone dipropionate 0.05 % cream Apply 1 Application topically 2 (two) times daily.   famotidine 20 MG tablet Commonly known as: PEPCID Take 20 mg by mouth at bedtime.   folic acid 1 MG tablet Commonly known as: FOLVITE Take 2 tablets (2 mg total) by mouth daily.   hydrOXYzine 10 MG tablet Commonly known as: ATARAX Take 10 mg by mouth at bedtime.   isosorbide mononitrate 30 MG 24 hr tablet Commonly known as: IMDUR Take 1 tablet (30 mg total) by mouth daily. What changed: when to take this   metoprolol succinate 25 MG 24 hr tablet Commonly known as: TOPROL-XL Take 1 tablet (25 mg total) by mouth daily. What changed: when to take this   ondansetron 4 MG tablet Commonly known as: ZOFRAN Take 1 tablet (4 mg total) by mouth every 6 (six) hours.   pantoprazole 40 MG tablet Commonly known as: PROTONIX TAKE 1 TABLET BY MOUTH EVERY DAY   Vitamin D 50 MCG (2000 UT) Caps Take 2,000 Units by mouth daily at 6 (six) AM.        Allergies:  Allergies  Allergen Reactions   Amlodipine Shortness Of Breath   Darifenacin  Hydrobromide Other (See Comments)    dizziness   Sulfonamide Derivatives Other (See Comments)    dizziness   Other Other (See Comments)    dizziness   Tramadol Other (See Comments)    Insomnia, anorexia    Past Medical History, Surgical history, Social history, and Family History were reviewed and updated.  Review of Systems: Review of Systems  Constitutional:  Positive for malaise/fatigue.  HENT: Negative.    Eyes: Negative.   Respiratory: Negative.    Cardiovascular: Negative.   Gastrointestinal: Negative.   Genitourinary: Negative.   Musculoskeletal: Negative.   Skin: Negative.   Neurological: Negative.   Endo/Heme/Allergies: Negative.   Psychiatric/Behavioral: Negative.        Physical Exam:  oral temperature is 97.7 F (36.5 C). Her blood pressure is 107/45 (abnormal) and her pulse is 75. Her respiration is 24 (abnormal) and oxygen  saturation is 100%.   Wt Readings from Last 3 Encounters:  02/15/23 74 lb (33.6 kg)  02/07/23 77 lb 2.6 oz (35 kg)  01/24/23 76 lb (34.5 kg)    Physical Exam Vitals reviewed.  Constitutional:      Comments: This is a very elderly, thin white female.  She is alert.  HENT:     Head: Normocephalic and atraumatic.  Eyes:     Pupils: Pupils are equal, round, and reactive to light.  Cardiovascular:     Rate and Rhythm: Normal rate and regular rhythm.     Heart sounds: Normal heart sounds.  Pulmonary:     Effort: Pulmonary effort is normal.     Breath sounds: Normal breath sounds.  Abdominal:     General: Bowel sounds are normal.     Palpations: Abdomen is soft.     Comments: Abdominal exam is soft.  She has decreased but active bowel sounds.  There is no fluid wave.  There is little bit of tenderness over in the left abdomen.  She has a little bit of fullness over in the left lower quadrant of the abdomen.  Musculoskeletal:        General: No tenderness or deformity. Normal range of motion.     Cervical back: Normal range of motion.     Comments: She has swelling in the lower legs.  She probably has about 2+ edema in the left leg and maybe 1+ edema in the right leg.  Lymphadenopathy:     Cervical: No cervical adenopathy.  Skin:    General: Skin is warm and dry.     Findings: No erythema or rash.  Neurological:     Mental Status: She is alert and oriented to person, place, and time.  Psychiatric:        Behavior: Behavior normal.        Thought Content: Thought content normal.        Judgment: Judgment normal.     Lab Results  Component Value Date   WBC 5.7 02/25/2023   HGB 6.6 (LL) 02/25/2023   HCT 21.3 (L) 02/25/2023   MCV 100.9 (H) 02/25/2023   PLT 300 02/25/2023   Lab Results  Component Value Date   FERRITIN 58 02/07/2023   IRON 26 (L) 02/07/2023   TIBC 265 02/07/2023   UIBC 239 02/07/2023   IRONPCTSAT 10 (L) 02/07/2023   Lab Results  Component Value Date    RETICCTPCT 6.9 (H) 02/07/2023   RBC 2.11 (L) 02/25/2023   No results found for: "KPAFRELGTCHN", "LAMBDASER", "KAPLAMBRATIO" No results found for: "IGGSERUM", "IGA", "IGMSERUM" No  results found for: "TOTALPROTELP", "ALBUMINELP", "A1GS", "A2GS", "BETS", "BETA2SER", "GAMS", "MSPIKE", "SPEI"   Chemistry      Component Value Date/Time   NA 138 02/25/2023 0802   NA 139 04/11/2021 1312   K 3.9 02/25/2023 0802   CL 103 02/25/2023 0802   CO2 26 02/25/2023 0802   BUN 30 (H) 02/25/2023 0802   BUN 30 04/11/2021 1312   CREATININE 0.95 02/25/2023 0802   CREATININE 1.21 (H) 12/28/2022 1526      Component Value Date/Time   CALCIUM 8.2 (L) 02/25/2023 0802   ALKPHOS 10 (L) 02/25/2023 0802   AST 13 (L) 02/25/2023 0802   ALT 6 02/25/2023 0802   BILITOT 0.3 02/25/2023 0802     Impression and Plan: [name] [name] is a very pleasant 89 yo caucasian female with multifactorial anemia (IDA and erythropoietin deficiency).  She now has colon cancer.  Given the elevated CEA, I have to believe that she has disease outside of the colon.  She clearly is bleeding from the tumor.  Unfortunately, there really is no way for [place] to stop this.  She had been hospitalized for this in the past.  There is nothing any the specialist could do for her.  Again, she is incredibly cachectic.  I do think though that a transfusion could certainly help her quality of life temporarily.  I am certainly looking for a way to try to help her quality of life.  I really believe that Hospice is a must for her.  I really think that they can help her out.  I think they can also help out her family.  I will go ahead and give her iron today also.  I do think that it would not be a bad idea to give her some vitamin B12.  I am not sure when we will be able to see her back in the office.  I really want her to have some comfort and some quality of life.   Josph Macho, MD 2/24/20252:18 PM

## 2023-02-25 NOTE — Patient Instructions (Signed)
 Blood Transfusion, Adult A blood transfusion is a procedure in which you receive blood or a type of blood cell (blood component) through an IV. You may need a blood transfusion when you have a low blood count, which is a low number of any blood cell. This may result from a bleeding disorder, illness, injury, or surgery. The blood may come from a donor, or you may be able to have your own blood collected and stored (autologous blood donation) before a planned surgery. The blood given in a transfusion may be made up of different blood components. You may receive: Red blood cells. These carry oxygen to the cells in the body. Platelets. These help your blood to clot. Plasma. This is the liquid part of your blood. It carries proteins and other substances throughout the body. White blood cells. These help you fight infections. If you have hemophilia or another clotting disorder, you may also receive other types of blood products. Depending on the type of blood product, this procedure may take 1-4 hours to complete. Tell a health care provider about: Any bleeding problems you have. Any previous reactions you have had during a blood transfusion. Any allergies you have. All medicines you are taking, including vitamins, herbs, eye drops, creams, and over-the-counter medicines. Any surgeries you have had. Any medical conditions you have. Whether you are pregnant or may be pregnant. What are the risks? Talk with your health care provider about risks. The most common problems include: A mild allergic reaction, such as red, swollen areas of skin (hives) and itching. Fever or chills. This may be the body's response to new blood cells received. This may occur during or up to 4 hours after the transfusion. More serious problems may include: A serious allergic reaction that causes difficulty breathing or swelling around the face and lips. Transfusion-associated circulatory overload (TACO), or too much fluid in  the lungs. This may cause breathing problems. Transfusion-related acute lung injury (TRALI), which causes breathing difficulty and low oxygen in the blood. This can occur within hours of the transfusion or several days later. Iron overload. This can happen after receiving many blood transfusions over a period of time. Infection or virus being transmitted. This is rare because donated blood is carefully tested before it is given. Hemolytic transfusion reaction. This is rare. It happens when the body's defense system (immune system)tries to attack the new blood cells. Symptoms may include fever, chills, nausea, low blood pressure, and low back or chest pain. Transfusion-associated graft-versus-host disease (TAGVHD). This is rare. It happens when donated cells attack the body's healthy tissues. What happens before the procedure? You will have a blood test to check your blood type. This test is done to know what kind of blood your body will accept and to match it to the donor blood. If you are going to have a planned surgery, you may be able to do an autologous blood donation. This may be done in case you need to have a transfusion. You will have your temperature, blood pressure, and pulse checked before the transfusion. If you have had an allergic reaction to a transfusion in the past, you may be given medicine to help prevent a reaction. This medicine may be given to you by mouth (orally) or through an IV. What happens during the procedure?  An IV will be inserted into one of your veins. The bag of blood will be attached to your IV. The blood will then enter through your vein. Your temperature, blood pressure,  and pulse will be monitored during the transfusion. This monitoring is done to detect early signs of a transfusion reaction. Tell your nurse right away if you have any of these symptoms during the transfusion: Shortness of breath or trouble breathing. Chest or back pain. Fever or  chills. Itching or hives. If you have any signs or symptoms of a reaction, your transfusion will be stopped and you may be given medicine. When the transfusion is complete, your IV will be removed. Pressure may be applied to the IV site for a few minutes. A bandage (dressing)will be applied. The procedure may vary among health care providers and hospitals. What happens after the procedure? Your temperature, blood pressure, pulse, breathing rate, and blood oxygen level will be monitored until you leave the hospital or clinic. Your blood may be tested to see how you have responded to the transfusion. You may be warmed with fluids or blankets to maintain a normal body temperature. If you receive your blood transfusion in an outpatient setting, you will be told whom to contact to report any reactions. Where to find more information Visit the [employer]: redcross.org Summary A blood transfusion is a procedure in which you receive blood or a type of blood cell (blood component) through an IV. The blood given in a transfusion may be made up of different blood components. You may receive red blood cells, platelets, plasma, or white blood cells depending on the condition treated. Your temperature, blood pressure, and pulse will be monitored before, during, and after the transfusion. After the transfusion, your blood may be tested to see how your body has responded. This information is not intended to replace advice given to you by your health care provider. Make sure you discuss any questions you have with your health care provider. Document Revised: 03/17/2021 Document Reviewed: 03/17/2021 Elsevier Patient Education  2024 [employer].

## 2023-02-26 LAB — TYPE AND SCREEN
ABO/RH(D): O POS
Antibody Screen: NEGATIVE
Unit division: 0
Unit division: 0

## 2023-02-26 LAB — BPAM RBC
Blood Product Expiration Date: 202503222359
Blood Product Expiration Date: 202503222359
ISSUE DATE / TIME: 202502241059
ISSUE DATE / TIME: 202502241059
Unit Type and Rh: 5100
Unit Type and Rh: 5100

## 2023-03-01 ENCOUNTER — Other Ambulatory Visit: Payer: Self-pay | Admitting: *Deleted

## 2023-03-01 DIAGNOSIS — N1831 Chronic kidney disease, stage 3a: Secondary | ICD-10-CM

## 2023-03-01 DIAGNOSIS — D631 Anemia in chronic kidney disease: Secondary | ICD-10-CM

## 2023-03-01 DIAGNOSIS — E538 Deficiency of other specified B group vitamins: Secondary | ICD-10-CM

## 2023-03-01 DIAGNOSIS — R634 Abnormal weight loss: Secondary | ICD-10-CM

## 2023-03-01 DIAGNOSIS — C186 Malignant neoplasm of descending colon: Secondary | ICD-10-CM

## 2023-03-01 DIAGNOSIS — R54 Age-related physical debility: Secondary | ICD-10-CM

## 2023-03-01 DIAGNOSIS — R627 Adult failure to thrive: Secondary | ICD-10-CM

## 2023-03-01 DIAGNOSIS — D500 Iron deficiency anemia secondary to blood loss (chronic): Secondary | ICD-10-CM

## 2023-03-01 DIAGNOSIS — Z7901 Long term (current) use of anticoagulants: Secondary | ICD-10-CM

## 2023-03-01 DIAGNOSIS — C182 Malignant neoplasm of ascending colon: Secondary | ICD-10-CM

## 2023-03-18 ENCOUNTER — Ambulatory Visit: Payer: Medicare Other | Admitting: Family Medicine

## 2023-03-21 ENCOUNTER — Ambulatory Visit: Payer: Medicare Other | Admitting: Family Medicine

## 2023-04-02 DEATH — deceased

## 2023-04-21 ENCOUNTER — Other Ambulatory Visit: Payer: Self-pay | Admitting: Family Medicine

## 2023-04-23 ENCOUNTER — Ambulatory Visit: Payer: Medicare Other | Admitting: Internal Medicine

## 2023-05-30 ENCOUNTER — Other Ambulatory Visit: Payer: Self-pay | Admitting: Family Medicine
